# Patient Record
Sex: Female | Born: 1940
Health system: Southern US, Community
[De-identification: ages and names within clinical notes are randomized; demographics above are authoritative.]

## PROBLEM LIST (undated history)

## (undated) DIAGNOSIS — Z950 Presence of cardiac pacemaker: Secondary | ICD-10-CM

## (undated) DIAGNOSIS — Z87442 Personal history of urinary calculi: Secondary | ICD-10-CM

## (undated) DIAGNOSIS — Z8639 Personal history of other endocrine, nutritional and metabolic disease: Secondary | ICD-10-CM

## (undated) DIAGNOSIS — R351 Nocturia: Secondary | ICD-10-CM

## (undated) DIAGNOSIS — R112 Nausea with vomiting, unspecified: Secondary | ICD-10-CM

## (undated) DIAGNOSIS — I472 Ventricular tachycardia: Secondary | ICD-10-CM

## (undated) DIAGNOSIS — Z8679 Personal history of other diseases of the circulatory system: Secondary | ICD-10-CM

## (undated) DIAGNOSIS — K219 Gastro-esophageal reflux disease without esophagitis: Secondary | ICD-10-CM

## (undated) DIAGNOSIS — Z8719 Personal history of other diseases of the digestive system: Secondary | ICD-10-CM

## (undated) DIAGNOSIS — K449 Diaphragmatic hernia without obstruction or gangrene: Secondary | ICD-10-CM

## (undated) DIAGNOSIS — N1330 Unspecified hydronephrosis: Secondary | ICD-10-CM

## (undated) DIAGNOSIS — I442 Atrioventricular block, complete: Secondary | ICD-10-CM

## (undated) DIAGNOSIS — C50919 Malignant neoplasm of unspecified site of unspecified female breast: Secondary | ICD-10-CM

## (undated) DIAGNOSIS — F329 Major depressive disorder, single episode, unspecified: Secondary | ICD-10-CM

## (undated) DIAGNOSIS — I5189 Other ill-defined heart diseases: Secondary | ICD-10-CM

## (undated) DIAGNOSIS — I480 Paroxysmal atrial fibrillation: Secondary | ICD-10-CM

## (undated) DIAGNOSIS — I10 Essential (primary) hypertension: Secondary | ICD-10-CM

## (undated) DIAGNOSIS — I809 Phlebitis and thrombophlebitis of unspecified site: Secondary | ICD-10-CM

## (undated) DIAGNOSIS — I4729 Other ventricular tachycardia: Secondary | ICD-10-CM

## (undated) DIAGNOSIS — C642 Malignant neoplasm of left kidney, except renal pelvis: Secondary | ICD-10-CM

## (undated) DIAGNOSIS — T7840XA Allergy, unspecified, initial encounter: Secondary | ICD-10-CM

## (undated) DIAGNOSIS — R918 Other nonspecific abnormal finding of lung field: Secondary | ICD-10-CM

## (undated) DIAGNOSIS — R0602 Shortness of breath: Secondary | ICD-10-CM

## (undated) DIAGNOSIS — L259 Unspecified contact dermatitis, unspecified cause: Secondary | ICD-10-CM

## (undated) DIAGNOSIS — F32A Depression, unspecified: Secondary | ICD-10-CM

## (undated) DIAGNOSIS — K9049 Malabsorption due to intolerance, not elsewhere classified: Secondary | ICD-10-CM

## (undated) DIAGNOSIS — K909 Intestinal malabsorption, unspecified: Secondary | ICD-10-CM

## (undated) DIAGNOSIS — G4730 Sleep apnea, unspecified: Secondary | ICD-10-CM

## (undated) DIAGNOSIS — E039 Hypothyroidism, unspecified: Secondary | ICD-10-CM

## (undated) DIAGNOSIS — Z9889 Other specified postprocedural states: Secondary | ICD-10-CM

## (undated) DIAGNOSIS — Z87898 Personal history of other specified conditions: Secondary | ICD-10-CM

## (undated) HISTORY — DX: Major depressive disorder, single episode, unspecified: F32.9

## (undated) HISTORY — DX: Atrioventricular block, complete: I44.2

## (undated) HISTORY — DX: Depression, unspecified: F32.A

## (undated) HISTORY — DX: Hypothyroidism, unspecified: E03.9

## (undated) HISTORY — PX: COLON SURGERY: SHX602

## (undated) HISTORY — DX: Other ill-defined heart diseases: I51.89

## (undated) HISTORY — PX: CARDIOVASCULAR STRESS TEST: SHX262

## (undated) HISTORY — DX: Allergy, unspecified, initial encounter: T78.40XA

## (undated) HISTORY — DX: Gastro-esophageal reflux disease without esophagitis: K21.9

## (undated) HISTORY — DX: Malignant neoplasm of unspecified site of unspecified female breast: C50.919

## (undated) HISTORY — DX: Unspecified contact dermatitis, unspecified cause: L25.9

## (undated) HISTORY — DX: Essential (primary) hypertension: I10

## (undated) HISTORY — DX: Phlebitis and thrombophlebitis of unspecified site: I80.9

## (undated) HISTORY — PX: KYPHOPLASTY: SHX5884

---

## 2001-04-13 ENCOUNTER — Emergency Department (HOSPITAL_COMMUNITY): Admission: EM | Admit: 2001-04-13 | Discharge: 2001-04-13 | Payer: Self-pay

## 2001-04-13 ENCOUNTER — Encounter: Payer: Self-pay | Admitting: Emergency Medicine

## 2001-05-04 ENCOUNTER — Encounter: Payer: Self-pay | Admitting: Endocrinology

## 2001-05-04 ENCOUNTER — Ambulatory Visit (HOSPITAL_COMMUNITY): Admission: RE | Admit: 2001-05-04 | Discharge: 2001-05-04 | Payer: Self-pay | Admitting: Endocrinology

## 2001-05-26 ENCOUNTER — Ambulatory Visit (HOSPITAL_COMMUNITY): Admission: RE | Admit: 2001-05-26 | Discharge: 2001-05-26 | Payer: Self-pay | Admitting: Endocrinology

## 2001-05-26 ENCOUNTER — Encounter: Payer: Self-pay | Admitting: Endocrinology

## 2002-04-28 ENCOUNTER — Inpatient Hospital Stay (HOSPITAL_COMMUNITY): Admission: EM | Admit: 2002-04-28 | Discharge: 2002-05-06 | Payer: Self-pay | Admitting: Emergency Medicine

## 2002-04-28 ENCOUNTER — Encounter (INDEPENDENT_AMBULATORY_CARE_PROVIDER_SITE_OTHER): Payer: Self-pay | Admitting: Specialist

## 2002-04-28 HISTORY — PX: OTHER SURGICAL HISTORY: SHX169

## 2002-06-01 ENCOUNTER — Ambulatory Visit (HOSPITAL_COMMUNITY): Admission: RE | Admit: 2002-06-01 | Discharge: 2002-06-01 | Payer: Self-pay | Admitting: Surgery

## 2002-06-01 ENCOUNTER — Encounter: Payer: Self-pay | Admitting: Surgery

## 2002-07-13 ENCOUNTER — Ambulatory Visit (HOSPITAL_COMMUNITY): Admission: RE | Admit: 2002-07-13 | Discharge: 2002-07-13 | Payer: Self-pay | Admitting: Surgery

## 2002-07-13 ENCOUNTER — Encounter: Payer: Self-pay | Admitting: Surgery

## 2002-07-15 ENCOUNTER — Encounter: Payer: Self-pay | Admitting: Surgery

## 2002-07-22 ENCOUNTER — Inpatient Hospital Stay (HOSPITAL_COMMUNITY): Admission: RE | Admit: 2002-07-22 | Discharge: 2002-07-31 | Payer: Self-pay | Admitting: Surgery

## 2002-07-22 ENCOUNTER — Encounter (INDEPENDENT_AMBULATORY_CARE_PROVIDER_SITE_OTHER): Payer: Self-pay | Admitting: Specialist

## 2002-07-22 HISTORY — PX: COLOSTOMY TAKEDOWN: SHX5783

## 2002-08-13 ENCOUNTER — Ambulatory Visit (HOSPITAL_COMMUNITY): Admission: RE | Admit: 2002-08-13 | Discharge: 2002-08-13 | Payer: Self-pay | Admitting: Surgery

## 2002-08-13 ENCOUNTER — Encounter: Payer: Self-pay | Admitting: Surgery

## 2002-10-18 ENCOUNTER — Ambulatory Visit (HOSPITAL_COMMUNITY): Admission: RE | Admit: 2002-10-18 | Discharge: 2002-10-18 | Payer: Self-pay | Admitting: *Deleted

## 2002-10-18 ENCOUNTER — Encounter: Payer: Self-pay | Admitting: *Deleted

## 2002-12-15 ENCOUNTER — Encounter (INDEPENDENT_AMBULATORY_CARE_PROVIDER_SITE_OTHER): Payer: Self-pay | Admitting: Specialist

## 2002-12-15 ENCOUNTER — Ambulatory Visit (HOSPITAL_COMMUNITY): Admission: RE | Admit: 2002-12-15 | Discharge: 2002-12-15 | Payer: Self-pay | Admitting: Gastroenterology

## 2003-01-25 ENCOUNTER — Encounter: Payer: Self-pay | Admitting: Emergency Medicine

## 2003-01-25 ENCOUNTER — Emergency Department (HOSPITAL_COMMUNITY): Admission: EM | Admit: 2003-01-25 | Discharge: 2003-01-26 | Payer: Self-pay | Admitting: Emergency Medicine

## 2003-02-02 ENCOUNTER — Encounter: Payer: Self-pay | Admitting: Internal Medicine

## 2003-02-02 ENCOUNTER — Ambulatory Visit (HOSPITAL_COMMUNITY): Admission: RE | Admit: 2003-02-02 | Discharge: 2003-02-02 | Payer: Self-pay | Admitting: Internal Medicine

## 2003-02-09 ENCOUNTER — Other Ambulatory Visit: Admission: RE | Admit: 2003-02-09 | Discharge: 2003-02-09 | Payer: Self-pay | Admitting: Obstetrics and Gynecology

## 2003-02-14 ENCOUNTER — Ambulatory Visit (HOSPITAL_COMMUNITY): Admission: RE | Admit: 2003-02-14 | Discharge: 2003-02-14 | Payer: Self-pay | Admitting: Internal Medicine

## 2003-02-14 ENCOUNTER — Encounter: Payer: Self-pay | Admitting: Internal Medicine

## 2003-03-10 ENCOUNTER — Ambulatory Visit (HOSPITAL_COMMUNITY): Admission: RE | Admit: 2003-03-10 | Discharge: 2003-03-10 | Payer: Self-pay | Admitting: Surgery

## 2003-05-10 ENCOUNTER — Inpatient Hospital Stay (HOSPITAL_COMMUNITY): Admission: EM | Admit: 2003-05-10 | Discharge: 2003-05-13 | Payer: Self-pay | Admitting: Emergency Medicine

## 2003-05-11 ENCOUNTER — Encounter: Payer: Self-pay | Admitting: Cardiology

## 2003-05-11 HISTORY — PX: PACEMAKER PLACEMENT: SHX43

## 2003-05-12 ENCOUNTER — Encounter: Payer: Self-pay | Admitting: Cardiology

## 2003-05-16 ENCOUNTER — Inpatient Hospital Stay (HOSPITAL_COMMUNITY): Admission: EM | Admit: 2003-05-16 | Discharge: 2003-05-18 | Payer: Self-pay | Admitting: Emergency Medicine

## 2003-05-17 ENCOUNTER — Encounter: Payer: Self-pay | Admitting: Cardiology

## 2003-05-20 ENCOUNTER — Ambulatory Visit (HOSPITAL_COMMUNITY): Admission: RE | Admit: 2003-05-20 | Discharge: 2003-05-20 | Payer: Self-pay | Admitting: Cardiology

## 2003-08-18 ENCOUNTER — Ambulatory Visit (HOSPITAL_COMMUNITY): Admission: RE | Admit: 2003-08-18 | Discharge: 2003-08-18 | Payer: Self-pay | Admitting: Internal Medicine

## 2003-09-08 ENCOUNTER — Ambulatory Visit (HOSPITAL_COMMUNITY): Admission: RE | Admit: 2003-09-08 | Discharge: 2003-09-08 | Payer: Self-pay | Admitting: Cardiology

## 2004-01-10 ENCOUNTER — Encounter: Admission: RE | Admit: 2004-01-10 | Discharge: 2004-01-10 | Payer: Self-pay | Admitting: Surgery

## 2004-04-16 ENCOUNTER — Other Ambulatory Visit: Admission: RE | Admit: 2004-04-16 | Discharge: 2004-04-16 | Payer: Self-pay | Admitting: Obstetrics and Gynecology

## 2004-04-26 ENCOUNTER — Ambulatory Visit (HOSPITAL_COMMUNITY): Admission: RE | Admit: 2004-04-26 | Discharge: 2004-04-26 | Payer: Self-pay | Admitting: Obstetrics and Gynecology

## 2004-05-04 ENCOUNTER — Inpatient Hospital Stay (HOSPITAL_COMMUNITY): Admission: RE | Admit: 2004-05-04 | Discharge: 2004-05-09 | Payer: Self-pay | Admitting: Surgery

## 2004-05-04 HISTORY — PX: OTHER SURGICAL HISTORY: SHX169

## 2004-06-05 ENCOUNTER — Ambulatory Visit: Payer: Self-pay | Admitting: Internal Medicine

## 2004-06-21 ENCOUNTER — Ambulatory Visit (HOSPITAL_COMMUNITY): Admission: RE | Admit: 2004-06-21 | Discharge: 2004-06-21 | Payer: Self-pay | Admitting: Pediatrics

## 2004-08-16 ENCOUNTER — Ambulatory Visit: Payer: Self-pay | Admitting: Internal Medicine

## 2004-08-25 ENCOUNTER — Ambulatory Visit: Payer: Self-pay | Admitting: Cardiovascular Disease

## 2004-08-27 ENCOUNTER — Inpatient Hospital Stay (HOSPITAL_COMMUNITY): Admission: EM | Admit: 2004-08-27 | Discharge: 2004-08-29 | Payer: Self-pay | Admitting: Emergency Medicine

## 2004-08-27 ENCOUNTER — Encounter: Payer: Self-pay | Admitting: Internal Medicine

## 2004-09-07 ENCOUNTER — Ambulatory Visit: Payer: Self-pay | Admitting: Internal Medicine

## 2004-09-17 ENCOUNTER — Ambulatory Visit (HOSPITAL_COMMUNITY): Admission: RE | Admit: 2004-09-17 | Discharge: 2004-09-17 | Payer: Self-pay | Admitting: Internal Medicine

## 2004-11-14 ENCOUNTER — Ambulatory Visit: Payer: Self-pay | Admitting: Internal Medicine

## 2004-11-14 ENCOUNTER — Ambulatory Visit (HOSPITAL_COMMUNITY): Admission: RE | Admit: 2004-11-14 | Discharge: 2004-11-14 | Payer: Self-pay | Admitting: Internal Medicine

## 2004-11-23 ENCOUNTER — Ambulatory Visit: Payer: Self-pay | Admitting: Internal Medicine

## 2005-01-25 ENCOUNTER — Ambulatory Visit: Payer: Self-pay | Admitting: Cardiology

## 2005-04-15 ENCOUNTER — Ambulatory Visit: Payer: Self-pay | Admitting: Internal Medicine

## 2005-06-26 ENCOUNTER — Ambulatory Visit: Payer: Self-pay | Admitting: Internal Medicine

## 2005-09-12 ENCOUNTER — Ambulatory Visit: Payer: Self-pay | Admitting: Cardiology

## 2005-10-23 ENCOUNTER — Ambulatory Visit: Payer: Self-pay | Admitting: Internal Medicine

## 2006-04-02 ENCOUNTER — Ambulatory Visit: Payer: Self-pay | Admitting: Internal Medicine

## 2006-04-18 ENCOUNTER — Ambulatory Visit: Payer: Self-pay | Admitting: Internal Medicine

## 2006-05-20 ENCOUNTER — Ambulatory Visit: Payer: Self-pay | Admitting: Internal Medicine

## 2006-06-18 ENCOUNTER — Ambulatory Visit: Payer: Self-pay | Admitting: Internal Medicine

## 2006-07-16 ENCOUNTER — Ambulatory Visit: Payer: Self-pay | Admitting: Internal Medicine

## 2006-07-22 ENCOUNTER — Ambulatory Visit: Payer: Self-pay | Admitting: Family Medicine

## 2006-08-28 ENCOUNTER — Ambulatory Visit (HOSPITAL_COMMUNITY): Admission: RE | Admit: 2006-08-28 | Discharge: 2006-08-28 | Payer: Self-pay | Admitting: Obstetrics and Gynecology

## 2006-08-31 ENCOUNTER — Emergency Department (HOSPITAL_COMMUNITY): Admission: EM | Admit: 2006-08-31 | Discharge: 2006-08-31 | Payer: Self-pay | Admitting: Emergency Medicine

## 2006-10-03 ENCOUNTER — Ambulatory Visit: Payer: Self-pay | Admitting: Internal Medicine

## 2006-10-03 ENCOUNTER — Other Ambulatory Visit: Admission: RE | Admit: 2006-10-03 | Discharge: 2006-10-03 | Payer: Self-pay | Admitting: Obstetrics and Gynecology

## 2006-10-03 LAB — CONVERTED CEMR LAB
AST: 33 units/L (ref 0–37)
Albumin: 4.6 g/dL (ref 3.5–5.2)
Basophils Absolute: 0 10*3/uL (ref 0.0–0.1)
Basophils Relative: 0.6 % (ref 0.0–1.0)
Bilirubin Urine: NEGATIVE
Bilirubin, Direct: 0.1 mg/dL (ref 0.0–0.3)
CO2: 32 meq/L (ref 19–32)
Eosinophils Absolute: 0.1 10*3/uL (ref 0.0–0.6)
GFR calc non Af Amer: 76 mL/min
Glucose, Bld: 103 mg/dL — ABNORMAL HIGH (ref 70–99)
HDL: 48.9 mg/dL (ref >39.0)
Hemoglobin: 13.2 g/dL (ref 12.0–15.0)
Ketones, ur: NEGATIVE mg/dL
Lymphocytes Relative: 26.9 % (ref 12.0–46.0)
Monocytes Relative: 9.8 % (ref 3.0–11.0)
Neutro Abs: 3 10*3/uL (ref 1.4–7.7)
Neutrophils Relative %: 61 % (ref 43.0–77.0)
Platelets: 148 10*3/uL — ABNORMAL LOW (ref 150–400)
Potassium: 4.3 meq/L (ref 3.5–5.1)
RBC: 4.88 M/uL (ref 3.87–5.11)
Sodium: 140 meq/L (ref 135–145)
Specific Gravity, Urine: <=1.005 (ref 1.000–1.03)
Total Protein, Urine: NEGATIVE mg/dL
Total Protein: 7.1 g/dL (ref 6.0–8.3)
Triglycerides: 78 mg/dL (ref 0–149)
Urine Glucose: NEGATIVE mg/dL
VLDL: 16 mg/dL (ref 0–40)
WBC: 4.9 10*3/uL (ref 4.5–10.5)
pH: 6.5 (ref 5.0–8.0)

## 2006-10-04 ENCOUNTER — Ambulatory Visit: Payer: Self-pay | Admitting: Family Medicine

## 2006-10-04 ENCOUNTER — Ambulatory Visit (HOSPITAL_COMMUNITY): Admission: RE | Admit: 2006-10-04 | Discharge: 2006-10-04 | Payer: Self-pay | Admitting: Internal Medicine

## 2006-11-04 ENCOUNTER — Ambulatory Visit: Payer: Self-pay | Admitting: Cardiology

## 2006-12-19 ENCOUNTER — Ambulatory Visit: Payer: Self-pay | Admitting: Internal Medicine

## 2007-01-23 DIAGNOSIS — K219 Gastro-esophageal reflux disease without esophagitis: Secondary | ICD-10-CM | POA: Insufficient documentation

## 2007-01-23 DIAGNOSIS — I10 Essential (primary) hypertension: Secondary | ICD-10-CM

## 2007-01-23 DIAGNOSIS — Z8639 Personal history of other endocrine, nutritional and metabolic disease: Secondary | ICD-10-CM

## 2007-01-23 DIAGNOSIS — Z862 Personal history of diseases of the blood and blood-forming organs and certain disorders involving the immune mechanism: Secondary | ICD-10-CM

## 2007-01-23 DIAGNOSIS — F329 Major depressive disorder, single episode, unspecified: Secondary | ICD-10-CM

## 2007-01-23 DIAGNOSIS — M810 Age-related osteoporosis without current pathological fracture: Secondary | ICD-10-CM | POA: Insufficient documentation

## 2007-02-16 ENCOUNTER — Ambulatory Visit: Payer: Self-pay | Admitting: Internal Medicine

## 2007-04-02 ENCOUNTER — Ambulatory Visit: Payer: Self-pay

## 2007-04-09 ENCOUNTER — Ambulatory Visit: Payer: Self-pay | Admitting: Internal Medicine

## 2007-05-22 ENCOUNTER — Telehealth: Payer: Self-pay | Admitting: Internal Medicine

## 2007-05-22 ENCOUNTER — Ambulatory Visit: Payer: Self-pay | Admitting: Internal Medicine

## 2007-07-22 ENCOUNTER — Ambulatory Visit: Payer: Self-pay | Admitting: Internal Medicine

## 2007-07-28 ENCOUNTER — Ambulatory Visit: Payer: Self-pay | Admitting: Internal Medicine

## 2007-07-28 LAB — CONVERTED CEMR LAB
AST: 22 units/L (ref 0–37)
Albumin: 4.2 g/dL (ref 3.5–5.2)
Bilirubin, Direct: 0.1 mg/dL (ref 0.0–0.3)
CO2: 35 meq/L — ABNORMAL HIGH (ref 19–32)
Calcium: 9.6 mg/dL (ref 8.4–10.5)
Cholesterol: 163 mg/dL (ref 0–200)
Creatinine, Ser: 0.8 mg/dL (ref 0.4–1.2)
GFR calc non Af Amer: 76 mL/min
LDL Cholesterol: 94 mg/dL (ref 0–99)
VLDL: 21 mg/dL (ref 0–40)
Vit D, 1,25-Dihydroxy: 28 — ABNORMAL LOW (ref 30–89)

## 2007-07-30 ENCOUNTER — Encounter: Payer: Self-pay | Admitting: Internal Medicine

## 2007-10-18 ENCOUNTER — Ambulatory Visit: Payer: Self-pay | Admitting: *Deleted

## 2007-10-18 ENCOUNTER — Inpatient Hospital Stay (HOSPITAL_COMMUNITY): Admission: EM | Admit: 2007-10-18 | Discharge: 2007-10-20 | Payer: Self-pay | Admitting: Emergency Medicine

## 2007-10-19 ENCOUNTER — Encounter: Payer: Self-pay | Admitting: Internal Medicine

## 2007-10-26 ENCOUNTER — Ambulatory Visit: Payer: Self-pay | Admitting: Internal Medicine

## 2007-10-26 DIAGNOSIS — I519 Heart disease, unspecified: Secondary | ICD-10-CM

## 2007-10-27 ENCOUNTER — Encounter (INDEPENDENT_AMBULATORY_CARE_PROVIDER_SITE_OTHER): Payer: Self-pay | Admitting: *Deleted

## 2007-11-03 ENCOUNTER — Ambulatory Visit: Payer: Self-pay

## 2007-11-03 ENCOUNTER — Encounter: Payer: Self-pay | Admitting: Internal Medicine

## 2007-11-03 ENCOUNTER — Ambulatory Visit: Payer: Self-pay | Admitting: Internal Medicine

## 2007-12-24 ENCOUNTER — Encounter: Payer: Self-pay | Admitting: Internal Medicine

## 2008-01-19 ENCOUNTER — Ambulatory Visit: Payer: Self-pay | Admitting: Internal Medicine

## 2008-01-19 DIAGNOSIS — E042 Nontoxic multinodular goiter: Secondary | ICD-10-CM

## 2008-01-21 ENCOUNTER — Telehealth: Payer: Self-pay | Admitting: Internal Medicine

## 2008-01-26 ENCOUNTER — Encounter: Admission: RE | Admit: 2008-01-26 | Discharge: 2008-01-26 | Payer: Self-pay | Admitting: Internal Medicine

## 2008-01-29 ENCOUNTER — Encounter: Payer: Self-pay | Admitting: Internal Medicine

## 2008-02-15 ENCOUNTER — Ambulatory Visit: Payer: Self-pay | Admitting: Internal Medicine

## 2008-03-24 ENCOUNTER — Ambulatory Visit: Payer: Self-pay | Admitting: Internal Medicine

## 2008-05-02 ENCOUNTER — Ambulatory Visit: Payer: Self-pay | Admitting: Internal Medicine

## 2008-05-02 ENCOUNTER — Telehealth: Payer: Self-pay | Admitting: Internal Medicine

## 2008-05-02 DIAGNOSIS — G44209 Tension-type headache, unspecified, not intractable: Secondary | ICD-10-CM

## 2008-05-27 ENCOUNTER — Ambulatory Visit: Payer: Self-pay | Admitting: Internal Medicine

## 2008-05-30 ENCOUNTER — Telehealth: Payer: Self-pay | Admitting: Internal Medicine

## 2008-06-01 ENCOUNTER — Ambulatory Visit: Payer: Self-pay | Admitting: Cardiology

## 2008-06-01 LAB — CONVERTED CEMR LAB
Free T4: 1.4 ng/dL (ref 0.6–1.6)
TSH: 0.05 microintl units/mL — ABNORMAL LOW (ref 0.35–5.50)

## 2008-06-13 ENCOUNTER — Encounter: Payer: Self-pay | Admitting: Internal Medicine

## 2008-06-15 ENCOUNTER — Ambulatory Visit: Payer: Self-pay | Admitting: Internal Medicine

## 2008-06-15 ENCOUNTER — Encounter (INDEPENDENT_AMBULATORY_CARE_PROVIDER_SITE_OTHER): Payer: Self-pay | Admitting: *Deleted

## 2008-07-26 ENCOUNTER — Ambulatory Visit: Payer: Self-pay | Admitting: Cardiology

## 2008-07-26 LAB — CONVERTED CEMR LAB: Free T4: 1.3 ng/dL (ref 0.6–1.6)

## 2008-08-25 ENCOUNTER — Ambulatory Visit: Payer: Self-pay | Admitting: Internal Medicine

## 2008-08-25 LAB — CONVERTED CEMR LAB: T4, Total: 10.3 ug/dL (ref 5.0–12.5)

## 2008-08-28 ENCOUNTER — Telehealth: Payer: Self-pay | Admitting: Internal Medicine

## 2008-09-05 ENCOUNTER — Ambulatory Visit: Payer: Self-pay | Admitting: Internal Medicine

## 2008-10-04 ENCOUNTER — Encounter (HOSPITAL_COMMUNITY): Admission: RE | Admit: 2008-10-04 | Discharge: 2008-11-04 | Payer: Self-pay | Admitting: Internal Medicine

## 2008-10-06 ENCOUNTER — Encounter: Payer: Self-pay | Admitting: Internal Medicine

## 2008-10-10 ENCOUNTER — Encounter: Payer: Self-pay | Admitting: Internal Medicine

## 2008-10-10 DIAGNOSIS — I8000 Phlebitis and thrombophlebitis of superficial vessels of unspecified lower extremity: Secondary | ICD-10-CM | POA: Insufficient documentation

## 2008-10-10 DIAGNOSIS — J984 Other disorders of lung: Secondary | ICD-10-CM | POA: Insufficient documentation

## 2008-10-11 ENCOUNTER — Encounter: Payer: Self-pay | Admitting: Cardiology

## 2008-10-11 ENCOUNTER — Ambulatory Visit: Payer: Self-pay | Admitting: Cardiology

## 2008-11-11 ENCOUNTER — Ambulatory Visit: Payer: Self-pay | Admitting: Cardiology

## 2008-11-11 ENCOUNTER — Telehealth: Payer: Self-pay | Admitting: Cardiology

## 2008-11-11 ENCOUNTER — Inpatient Hospital Stay (HOSPITAL_COMMUNITY): Admission: EM | Admit: 2008-11-11 | Discharge: 2008-11-12 | Payer: Self-pay | Admitting: Emergency Medicine

## 2008-11-24 ENCOUNTER — Telehealth: Payer: Self-pay | Admitting: Internal Medicine

## 2008-11-29 ENCOUNTER — Telehealth (INDEPENDENT_AMBULATORY_CARE_PROVIDER_SITE_OTHER): Payer: Self-pay | Admitting: *Deleted

## 2008-11-30 ENCOUNTER — Ambulatory Visit: Payer: Self-pay | Admitting: Internal Medicine

## 2008-11-30 DIAGNOSIS — M6740 Ganglion, unspecified site: Secondary | ICD-10-CM | POA: Insufficient documentation

## 2008-11-30 DIAGNOSIS — D179 Benign lipomatous neoplasm, unspecified: Secondary | ICD-10-CM | POA: Insufficient documentation

## 2008-12-13 ENCOUNTER — Ambulatory Visit: Payer: Self-pay | Admitting: Internal Medicine

## 2008-12-13 ENCOUNTER — Encounter: Payer: Self-pay | Admitting: Internal Medicine

## 2009-01-10 ENCOUNTER — Telehealth: Payer: Self-pay | Admitting: Internal Medicine

## 2009-01-18 ENCOUNTER — Telehealth: Payer: Self-pay | Admitting: Internal Medicine

## 2009-02-11 ENCOUNTER — Telehealth: Payer: Self-pay | Admitting: Internal Medicine

## 2009-02-14 ENCOUNTER — Ambulatory Visit: Payer: Self-pay | Admitting: Internal Medicine

## 2009-02-16 ENCOUNTER — Telehealth: Payer: Self-pay | Admitting: Internal Medicine

## 2009-02-24 LAB — CONVERTED CEMR LAB
TSH: 0.03 microintl units/mL — ABNORMAL LOW (ref 0.35–5.50)
Vit D, 25-Hydroxy: 24 ng/mL — ABNORMAL LOW (ref 30–89)

## 2009-03-20 ENCOUNTER — Encounter: Payer: Self-pay | Admitting: Internal Medicine

## 2009-04-11 ENCOUNTER — Ambulatory Visit: Payer: Self-pay | Admitting: Internal Medicine

## 2009-04-24 ENCOUNTER — Telehealth (INDEPENDENT_AMBULATORY_CARE_PROVIDER_SITE_OTHER): Payer: Self-pay | Admitting: *Deleted

## 2009-05-10 ENCOUNTER — Encounter (INDEPENDENT_AMBULATORY_CARE_PROVIDER_SITE_OTHER): Payer: Self-pay | Admitting: *Deleted

## 2009-05-16 ENCOUNTER — Encounter (INDEPENDENT_AMBULATORY_CARE_PROVIDER_SITE_OTHER): Payer: Self-pay | Admitting: *Deleted

## 2009-05-16 ENCOUNTER — Ambulatory Visit: Payer: Self-pay | Admitting: Cardiology

## 2009-05-17 ENCOUNTER — Ambulatory Visit: Payer: Self-pay | Admitting: Cardiology

## 2009-05-21 LAB — CONVERTED CEMR LAB
BUN: 13 mg/dL (ref 6–23)
CO2: 30 meq/L (ref 19–32)
Creatinine, Ser: 0.8 mg/dL (ref 0.4–1.2)
GFR calc non Af Amer: 90.99 mL/min (ref >60)
Glucose, Bld: 93 mg/dL (ref 70–99)

## 2009-06-08 ENCOUNTER — Ambulatory Visit: Payer: Self-pay | Admitting: Internal Medicine

## 2009-06-09 ENCOUNTER — Telehealth: Payer: Self-pay | Admitting: Internal Medicine

## 2009-07-03 ENCOUNTER — Telehealth: Payer: Self-pay | Admitting: Internal Medicine

## 2009-07-04 ENCOUNTER — Ambulatory Visit: Payer: Self-pay | Admitting: Internal Medicine

## 2009-07-04 LAB — CONVERTED CEMR LAB
TSH: 0.09 microintl units/mL — ABNORMAL LOW (ref 0.35–5.50)
Vit D, 25-Hydroxy: 32 ng/mL (ref 30–89)

## 2009-07-09 ENCOUNTER — Telehealth: Payer: Self-pay | Admitting: Internal Medicine

## 2009-09-18 ENCOUNTER — Encounter: Payer: Self-pay | Admitting: Internal Medicine

## 2009-09-19 ENCOUNTER — Encounter: Payer: Self-pay | Admitting: Internal Medicine

## 2009-09-19 ENCOUNTER — Telehealth: Payer: Self-pay | Admitting: Internal Medicine

## 2009-09-26 ENCOUNTER — Encounter: Payer: Self-pay | Admitting: Internal Medicine

## 2009-09-28 ENCOUNTER — Telehealth: Payer: Self-pay | Admitting: Internal Medicine

## 2009-10-12 ENCOUNTER — Ambulatory Visit: Payer: Self-pay | Admitting: Internal Medicine

## 2009-10-13 ENCOUNTER — Ambulatory Visit: Payer: Self-pay | Admitting: Internal Medicine

## 2009-11-23 ENCOUNTER — Ambulatory Visit: Payer: Self-pay | Admitting: Internal Medicine

## 2009-11-23 DIAGNOSIS — I442 Atrioventricular block, complete: Secondary | ICD-10-CM | POA: Insufficient documentation

## 2010-04-04 ENCOUNTER — Telehealth: Payer: Self-pay | Admitting: Internal Medicine

## 2010-04-13 ENCOUNTER — Ambulatory Visit: Payer: Self-pay | Admitting: Internal Medicine

## 2010-05-17 ENCOUNTER — Ambulatory Visit: Payer: Self-pay

## 2010-06-09 ENCOUNTER — Emergency Department (HOSPITAL_BASED_OUTPATIENT_CLINIC_OR_DEPARTMENT_OTHER)
Admission: EM | Admit: 2010-06-09 | Discharge: 2010-06-09 | Payer: Self-pay | Source: Home / Self Care | Admitting: Emergency Medicine

## 2010-06-11 ENCOUNTER — Emergency Department (HOSPITAL_BASED_OUTPATIENT_CLINIC_OR_DEPARTMENT_OTHER)
Admission: EM | Admit: 2010-06-11 | Discharge: 2010-06-11 | Payer: Self-pay | Source: Home / Self Care | Admitting: Emergency Medicine

## 2010-06-12 ENCOUNTER — Ambulatory Visit: Payer: Self-pay | Admitting: Cardiology

## 2010-06-22 ENCOUNTER — Encounter: Payer: Self-pay | Admitting: Cardiology

## 2010-06-22 ENCOUNTER — Ambulatory Visit: Payer: Self-pay | Admitting: Cardiology

## 2010-06-22 LAB — CONVERTED CEMR LAB
CO2: 27 meq/L (ref 19–32)
Chloride: 101 meq/L (ref 96–112)
Creatinine, Ser: 0.7 mg/dL (ref 0.4–1.2)
Potassium: 3.9 meq/L (ref 3.5–5.1)
Sodium: 135 meq/L (ref 135–145)

## 2010-07-11 ENCOUNTER — Telehealth: Payer: Self-pay | Admitting: Cardiology

## 2010-07-22 ENCOUNTER — Encounter: Payer: Self-pay | Admitting: Obstetrics and Gynecology

## 2010-07-29 LAB — CONVERTED CEMR LAB
GFR calc non Af Amer: 106.15 mL/min (ref >60)
Potassium: 5.5 meq/L — ABNORMAL HIGH (ref 3.5–5.1)

## 2010-07-31 NOTE — Assessment & Plan Note (Signed)
Summary: DEVICE FOLLOW-UP/LWB   Visit Type:  Follow-up Primary Provider:  Norins  CC:  no complaints.  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  she's had no symptoms of chest pain shortness of breath or peripheral edema.  Her blood pressure is noted to be elevated today.  This has been true in the past as well. She has had modest concern about this. She takes ibuprofen only rarely and is quite judicious about her salt intake.  Review of previous data demonstrates an echo in 2009 at which time she had normal left ventricular function.  chest x-rays were reviewed back to 2006 all of which showed stable (potentially dislodged) position of the atrial lead  Current Medications (verified): 1)  Synthroid 100 Mcg Tabs (Levothyroxine Sodium) .[name].. 1 By Mouth Once Daily 2)  Multivitamins   Caps (Multiple Vitamin) .... Once Daily 3)  Calcium Carbonate-Vitamin D 600-400 Mg-Unit  Tabs (Calcium Carbonate-Vitamin D) .... Take 2 Two Times A Day 4)  Furosemide 40 Mg  Tabs (Furosemide) .[name].. 1 By Mouth Once Daily 5)  Lisinopril 20 Mg  Tabs (Lisinopril) .... 2 Tabs Once Daily 6)  Nexium 40 Mg  Cpdr (Esomeprazole Magnesium) .... Q Am As Needed 7)  Prolia 60 Mg/ml Soln (Denosumab) .... One Inject. Every 6 Months 8)  Ibuprofen 200 Mg Tabs (Ibuprofen) .... 3 Tabs As Needed  Allergies (verified): 1)  ! Codeine  Past History:  Past Medical History: Last updated: 05/16/2009 HEART BLOCK (ICD-426.9) DIASTOLIC DYSFUNCTION (ICD-429.9) HYPERTENSION (ICD-401.9) NECK PAIN, ACUTE (ICD-723.1) HEADACHE, TENSION (ICD-307.81) GOITER, MULTINODULAR (ICD-241.1) SHORTNESS OF BREATH (ICD-786.05) CONTACT DERMATITIS&OTHER ECZEMA DUE UNSPEC CAUSE (ICD-692.9) ABSCESS, TOOTH (ICD-522.5) THYROID NODULE, HX OF (ICD-V12.2) * UCD OSTEOPOROSIS (ICD-733.00) GERD  (ICD-530.81) DEPRESSION (ICD-311) LUNG NODULE (ICD-518.89) SUPERFICIAL THROMBOPHLEBITIS (ICD-451.0) HYPOTHYROIDISM, HX OF (ICD-V12.2) OTHER ACUTE SINUSITIS (ICD-461.8) 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   hernia repair.  Family History: Last updated: Oct 27, 2008  Mother died at the age of 46 of unclear causes.  It was   thought that she had some heart disease at the time.  Her father died at   the age of 58 of congestive heart failure.  She has a sister who has   diverticulosis and two brothers with hypertension.  Social History: Last updated: 10/27/2008  She lives in [place] with her daughter.  She has   remote tobacco use, quitting 30 years ago.  No alcohol or drugs.  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   Vital Signs:  Patient profile:   70 year old female Height:      65 inches Weight:      133 pounds BMI:     22.21 Pulse rate:   82 / minute Resp:     16 per minute BP sitting:   152 / 98  (right arm)  Vitals Entered By: Marrion Coy, CNA (Nov 23, 2009 2:12 PM)  Physical Exam  General:  The patient was alert and oriented in no acute distress. HEENT Normal.  Neck veins were flat, carotids were brisk.  Lungs were clear.  Heart sounds were regular without murmurs or gallops.  Abdomen was soft with active bowel sounds. There is  no clubbing cyanosis or edema. Skin Warm and dry affect is engaging. Neurological exam grossly normal   PPM Specifications Following MD:  Sherryl Manges, MD     PPM Vendor:  Medtronic     PPM Model Number:  [id number]     PPM Serial Number:  [id number] PPM DOI:  05/11/2003      Lead 1    Location: atrial     DOI: 05/11/2003     Model #: 1642     Serial #: [id number]     Status: active Lead 2    Location: RV     DOI: 05/11/2003      Model #: 1646     Serial #: [id number]     Status: active  Magnet Response Rate:  BOL 85 ERI 65  Indications:  complete heart block   PPM Follow Up Battery Voltage:  2.71 V     Battery Est. Longevity:  22 mths     Pacer Dependent:  Yes       PPM Device Measurements Atrium  Amplitude: 2.80 mV, Impedance: 619 ohms, Threshold: 0.50 V at 0.40 msec Right Ventricle  Amplitude: PACED mV, Impedance: 659 ohms, Threshold: 0.750 V at 0.40 msec  Episodes MS Episodes:  18     Percent Mode Switch:  <0.1%     Coumadin:  No Ventricular High Rate:  1     Atrial Pacing:  0.2%     Ventricular Pacing:  99.9%  Parameters Mode:  DDD     Lower Rate Limit:  60     Upper Rate Limit:  130 Paced AV Delay:  150     Sensed AV Delay:  120 Next Cardiology Appt Due:  05/01/2010 Tech Comments:  1 EPISODE OF VT LASTING 3 SECONDS.  18 AHR EPISODES--LONGEST WAS 33 SECONDS --AFLUTTER.  NO RWAVES AT VVI 40.  NORMAL THRESHOLD TESTING.  BATTERY LONGEVITY 22 MTHS.  ROV IN 6 MTHS.  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 Dating back to 2005 disposition is stable and function is adequate. No intervention is planned  Problem # 3:  HYPERTENSION (ICD-401.9) Her hypertension is poorly controlled. Will plan to revise her medications as follows #1 discontinue Lasix #2 change lisinopril to lisinopril/HCT #3 begin amlodipine 2.5 mg. The following medications were removed from the medication list:    Furosemide 40 Mg Tabs (Furosemide) .[name]... 1 by mouth once daily Her updated medication list for this problem includes:    Lisinopril-hydrochlorothiazide 20-12.5 Mg Tabs (Lisinopril-hydrochlorothiazide) .[name]...  Take 1 tablet by mouth twice daily    Amlodipine Besylate 2.5 Mg Tabs (Amlodipine besylate) .[name]... Take one tablet by mouth daily  Problem # 4:  VENTRICULAR TACHYCARDIA- NONSUSTAINED (ICD-427.1) Nonsustained ventricular tachycardia was identified on her monitor. We'll repeat her cardiac ultrasound. Her functional status remains very good and I suspect her LV function will be the same Her updated medication list for this problem includes:    Lisinopril-hydrochlorothiazide 20-12.5 Mg Tabs (Lisinopril-hydrochlorothiazide) .[name]... Take 1 tablet by mouth twice daily    Amlodipine Besylate 2.5 Mg Tabs (Amlodipine besylate) .[name]... Take one tablet by mouth daily  Problem # 5:  ATRIAL TACHYCARDIA-NONSUSTAINED (ICD-427.0) Nonsustained atrial tachycardia was identified. we'll  continue to track atrial arrhythmias for the possible development of atrial fibrillation  Problem # 6:  AV BLOCK, COMPLETE (ICD-426.0) Stable post pacemaker implantation; she is device dependent  Patient Instructions: 1)  Your physician has recommended you make the following change in your medication: Stop Furosemide.  Stop Lisinopril.  Start Lisinopril/HCT 20/12.5 1 tablet twice daily.  Start Amlodipine 2.5mg  1 tablet daily.  2)  Your physician wants you to follow-up in:  6 months with device clinic.  You will receive a reminder letter in the mail two months in advance. If you don't receive a letter, please call our office to schedule the follow-up appointment. Prescriptions: AMLODIPINE BESYLATE 2.5 MG TABS (AMLODIPINE BESYLATE) Take one tablet by mouth daily  #30 x 11   Entered by:   Optometrist BSN   Authorized by:   Nathen May, MD, [facility]   Signed by:   Gypsy Balsam RN BSN on 11/23/2009   Method used:   Electronically to        [facility] # [phone]* (retail)       [address]       [place], [place]  [address]       Ph: [phone]       Fax: [phone]   RxID:    [id number] LISINOPRIL-HYDROCHLOROTHIAZIDE 20-12.5 MG TABS (LISINOPRIL-HYDROCHLOROTHIAZIDE) Take 1 tablet by mouth twice daily  #180 x 3   Entered by:   Optometrist BSN   Authorized by:   Nathen May, MD, [facility]   Signed by:   Gypsy Balsam RN BSN on 11/23/2009   Method used:   Electronically to        [facility] # [phone]* (retail)       [address]       [place], [place]  [address]       Ph: [phone]       Fax: [phone]   RxID:   [id number]

## 2010-07-31 NOTE — Progress Notes (Signed)
Summary: flu shot    Immunization History:  Influenza Immunization History:    Influenza:  0.72ml left deltoid im  (03/30/2010)

## 2010-07-31 NOTE — Medication Information (Signed)
Summary: ProliaPlus  ProliaPlus   Imported By: Lester [place] 10/03/2009 07:18:27  _____________________________________________________________________  External Attachment:    Type:   Image     Comment:   External Document

## 2010-07-31 NOTE — Assessment & Plan Note (Signed)
Summary: prolia injection-lb  Nurse Visit   Allergies: 1)  ! Codeine  Medication Administration  Injection # 1:    Medication: Prolia 60mg     Diagnosis: OSTEOPOROSIS (ICD-733.00)    Route: SQ    Site: R deltoid    Exp Date: 06/01/2011    Lot #: [id number]    Mfr: Amgen    Patient tolerated injection without complications    Given by: Lanier Prude, [facility]) (April 13, 2010 9:57 AM)  Orders Added: 1)  Prolia 60mg  [J3590] 2)  Admin of Therapeutic Inj  intramuscular or subcutaneous [45409]

## 2010-07-31 NOTE — Assessment & Plan Note (Signed)
Summary: PROLIA INJ PER LOU/MEN/CD  Nurse Visit  CC: after verifing pt's DOB, injection was administered in her right arm SQ , pt tolerated well/ ab   Allergies: 1)  ! Codeine  Medication Administration  Injection # 1:    Medication: Prolia 60mg     Diagnosis: OSTEOPOROSIS (ICD-733.00)    Route: SQ    Site: right arm     Exp Date: 05/2011    Lot #: [id number]    Mfr: amgen    Patient tolerated injection without complications    Given by: Ami Bullins CMA (October 13, 2009 10:30 AM)  Orders Added: 1)  Admin of Therapeutic Inj  intramuscular or subcutaneous [96372] 2)  Prolia 60mg  [J3590]

## 2010-07-31 NOTE — Progress Notes (Signed)
Summary: Synthroid  Phone Note Outgoing Call   Reason for Call: Discuss lab or test results Summary of Call: please call patient - Vit d level is normal  Thank you Initial call taken by: Jacques Navy MD,  July 09, 2009 5:06 PM  Follow-up for Phone Call        lmoam for pt to call back Follow-up by: Ami Bullins CMA,  July 10, 2009 8:35 AM  Additional Follow-up for Phone Call Additional follow up Details #1::        informed pt on vitd level. Pt wants to know if she should continue her synthroid 100 micrograms if so she needs 90 sent to [facility] [address] in [place] Additional Follow-up by: Ami Bullins CMA,  July 10, 2009 3:46 PM    Additional Follow-up for Phone Call Additional follow up Details #2::    continue synthroid . OK for #90 3 refills Follow-up by: Jacques Navy MD,  July 10, 2009 5:49 PM  Additional Follow-up for Phone Call Additional follow up Details #3:: Details for Additional Follow-up Action Taken: Patient notified. Additional Follow-up by: Lucious Groves,  July 12, 2009 10:19 AM  Prescriptions: SYNTHROID 100 MCG TABS (LEVOTHYROXINE SODIUM) 1 by mouth once daily  #90 x 3   Entered by:   Lucious Groves   Authorized by:   Jacques Navy MD   Signed by:   Lucious Groves on 07/12/2009   Method used:   Electronically to        [facility] # [phone]* (retail)       [address]       [place], [place]  [address]       Ph: [phone]       Fax: [phone]   RxID:   [id number]

## 2010-07-31 NOTE — Cardiovascular Report (Signed)
Summary: Office Visit   Card Device [facility]   Imported By: Roderic Ovens 12/12/2009 10:57:26  _____________________________________________________________________  External Attachment:    Type:   Image     Comment:   External Document

## 2010-07-31 NOTE — Medication Information (Signed)
Summary: ProliaPlus  ProliaPlus   Imported By: Lester [place] 10/11/2009 09:52:21  _____________________________________________________________________  External Attachment:    Type:   Image     Comment:   External Document

## 2010-07-31 NOTE — Progress Notes (Signed)
Summary: Prolia denied  Phone Note Other Incoming   Summary of Call: I spoke with Prolia rep yesterday and was aware that Prolia has been denied. He is not given patient names, but told me to re-submit and be sure to use 2 diagnosis codes. We rec'd denial for this patient, so I did as rep explained, and re-submitted Prolia paperwork. Initial call taken by: Lucious Groves,  September 19, 2009 1:54 PM

## 2010-07-31 NOTE — Progress Notes (Signed)
Summary: Prolia  Phone Note Other Incoming   Summary of Call: Patient is due for Prolia inj. Patient will pay $0 oop. Left message on machine to call back to office. Initial call taken by: Lucious Groves,  September 28, 2009 8:59 AM  Follow-up for Phone Call        informed pt and she will come on april 12 Follow-up by: Ami Bullins CMA,  September 28, 2009 1:18 PM

## 2010-07-31 NOTE — Medication Information (Signed)
Summary: Denial/proliaplus  Denial/proliaplus   Imported By: Lester [place] 10/11/2009 09:49:39  _____________________________________________________________________  External Attachment:    Type:   Image     Comment:   External Document

## 2010-07-31 NOTE — Procedures (Signed)
Summary: PER CHECK OUT/SF   Current Medications (verified): 1)  Synthroid 100 Mcg Tabs (Levothyroxine Sodium) .[name].. 1 By Mouth Once Daily 2)  Multivitamins   Caps (Multiple Vitamin) .... Once Daily 3)  Calcium Carbonate-Vitamin D 600-400 Mg-Unit  Tabs (Calcium Carbonate-Vitamin D) .... Take 2 Two Times A Day 4)  Lisinopril-Hydrochlorothiazide 20-12.5 Mg Tabs (Lisinopril-Hydrochlorothiazide) .... Take 1 Tablet By Mouth Twice Daily 5)  Nexium 40 Mg  Cpdr (Esomeprazole Magnesium) .... Q Am As Needed 6)  Prolia 60 Mg/ml Soln (Denosumab) .... One Inject. Every 6 Months 7)  Ibuprofen 200 Mg Tabs (Ibuprofen) .... 3 Tabs As Needed 8)  Amlodipine Besylate 2.5 Mg Tabs (Amlodipine Besylate) .... Take One Tablet By Mouth Daily  Allergies (verified): 1)  ! Codeine  PPM Specifications Following MD:  Sherryl Manges, MD     PPM Vendor:  Medtronic     PPM Model Number:  [id number]     PPM Serial Number:  [id number] PPM DOI:  05/11/2003      Lead 1    Location: atrial     DOI: 05/11/2003     Model #: 1642     Serial #: [id number]     Status: active Lead 2    Location: RV     DOI: 05/11/2003     Model #: 1646     Serial #: [id number]     Status: active  Magnet Response Rate:  BOL 85 ERI 65  Indications:  complete heart block   PPM Follow Up Battery Voltage:  2.70 V     Battery Est. Longevity:  16 mths     Pacer Dependent:  Yes       PPM Device Measurements Atrium  Amplitude: 2.80 mV, Impedance: 620 ohms, Threshold: 0.50 V at 0.40 msec Right Ventricle  Amplitude: 31.36 mV, Impedance: 650 ohms, Threshold: 1.00 V at 0.40 msec  Episodes MS Episodes:  59     Percent Mode Switch:  <0.1%     Coumadin:  No Ventricular High Rate:  0     Atrial Pacing:  0.3%     Ventricular Pacing:  99.9%  Parameters Mode:  DDD     Lower Rate Limit:  60     Upper Rate Limit:  130 Paced AV Delay:  150     Sensed AV Delay:  120 Next Cardiology Appt Due:  10/30/2010 Tech Comments:  59 MODE SWITCHES.  NORMAL DEVICE FUNCTION.  NO  CHANGES MADE. ROV IN 6 MTHS W/SK. Vella Kohler  May 17, 2010 10:20 AM

## 2010-07-31 NOTE — Progress Notes (Signed)
Summary: Synthroid  Phone Note Call from Patient   Summary of Call: Patient is requesting to know if she should continue synthroid ? or take ?  Initial call taken by: Lamar Sprinkles, CMA,  July 03, 2009 1:22 PM  Follow-up for Phone Call        based on last TSH Nov 16th of 0.03 recommend she continue on Synthroid . Will need f/u TSH 244.9 Follow-up by: Jacques Navy MD,  July 03, 2009 6:15 PM

## 2010-08-02 NOTE — Assessment & Plan Note (Signed)
Summary: ROV   Visit Type:  Follow-up Primary Provider:  Norins  CC:  No cardiac complaints.  History of Present Illness: Doing well overall.  No complaints whatsoever.  Feels good.  BP is better since medication adjustment by Dr. Graciela Husbands.  No chest pain or shortness of breath.  Last echo of 2009 reviewed.    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 1 By Mouth Once Daily 2)  Multivitamins   Caps (Multiple Vitamin) .... Once Daily 3)  Calcium Carbonate-Vitamin D 600-400 Mg-Unit  Tabs (Calcium Carbonate-Vitamin D) .... Take 2 Tablets Daily 4)  Lisinopril-Hydrochlorothiazide 20-12.5 Mg Tabs (Lisinopril-Hydrochlorothiazide) .... Take 1 Tablet By Mouth Twice Daily 5)  Nexium 40 Mg  Cpdr (Esomeprazole Magnesium) .... Q Am As Needed 6)  Prolia 60 Mg/ml Soln (Denosumab) .... One Inject. Every 6 Months 7)  Ibuprofen 200 Mg Tabs (Ibuprofen) .... As Needed 8)  Amlodipine Besylate 2.5 Mg Tabs (Amlodipine Besylate) .... Take One Tablet By Mouth Daily 9)  Coricidin Hbp Cold/flu 2-325 Mg Tabs  (Chlorpheniramine-Acetaminophen) .... As Needed  Allergies: 1)  ! Codeine  Vital Signs:  Patient profile:   70 year old female Height:      65 inches Weight:      137.50 pounds BMI:     22.96 Pulse rate:   67 / minute Pulse rhythm:   regular Resp:     18 per minute BP sitting:   130 / 80  (left arm) Cuff size:   large  Vitals Entered By: Vikki Ports (June 22, 2010 8:55 AM)  Physical Exam  General:  Well developed, well nourished, in no acute distress. Head:  normocephalic and atraumatic Eyes:  PERRLA/EOM intact; conjunctiva and lids normal. Neck:  Goiter Lungs:  Clear bilaterally to auscultation and percussion. Heart:  PMI non displaced.  No significant murmur. Pulses:  pulses normal in all 4 extremities Extremities:  No clubbing or cyanosis.  No edema noted Neurologic:  Alert and oriented x 3.   EKG  Procedure date:  06/22/2010  Findings:      Atrial tracking and ventricular pacing.    PPM Specifications Following MD:  Sherryl Manges, MD     PPM Vendor:  Medtronic     PPM Model Number:  [id number]     PPM Serial Number:  [id number] PPM DOI:  05/11/2003      Lead 1    Location: atrial     DOI: 05/11/2003     Model #: 1642     Serial #: [id number]     Status: active Lead 2    Location: RV     DOI: 05/11/2003  Model #: [id number]     Serial #: [id number]     Status: active  Magnet Response Rate:  BOL 85 ERI 65  Indications:  complete heart block   PPM Follow Up Pacer Dependent:  Yes      Episodes Coumadin:  No  Parameters Mode:  DDD     Lower Rate Limit:  60     Upper Rate Limit:  130 Paced AV Delay:  150     Sensed AV Delay:  120  Impression & Recommendations:  Problem # 1:  HYPERTENSION (ICD-401.9) Needs BMET.  Better control on current therapy. Her updated medication list for this problem includes:    Lisinopril-hydrochlorothiazide 20-12.5 Mg Tabs (Lisinopril-hydrochlorothiazide) .[name]... Take 1 tablet by mouth twice daily    Amlodipine Besylate 2.5 Mg Tabs  (Amlodipine besylate) .[name]... 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  Dr. Graciela Husbands following.   Patient Instructions: 1)  Your physician recommends that you have lab work today: BMP 2)  Your physician recommends that you continue on your current medications as directed. Please refer to the Current Medication list given to you today. 3)  Your physician wants you to follow-up in:  1 YEAR.  You will receive a reminder letter in the mail two months in advance. If you don't receive a letter, please call our office to schedule the follow-up appointment.

## 2010-08-02 NOTE — Progress Notes (Signed)
Summary: Question need for SBE  Phone Note Call from Patient Call back at Home Phone [phone]   Caller: Patient Summary of Call: pt is having dental work tomorrow pt wants know if she needs to take meds.  Initial call taken by: Roe Coombs,  July 11, 2010 9:34 AM  Follow-up for Phone Call        I spoke with the pt and made her aware that she does not require SBE.  The pt has a history of mild MR and pacemaker. Follow-up by: Julieta Gutting, RN, BSN,  July 11, 2010 9:45 AM

## 2010-10-03 ENCOUNTER — Telehealth: Payer: Self-pay | Admitting: *Deleted

## 2010-10-03 NOTE — Telephone Encounter (Signed)
Will have med ordered to have it here by 10-09-10.  I previously informed pt that she would owe her $140 deduct and 20% co-ins (approx $165).  She is questioning this and states she spoke to Medicare and they state she doesn't have to pay this. I will provide her with a copy of the benefit summary from Prolia[employer] stating this info when she comes for her inj.

## 2010-10-09 ENCOUNTER — Ambulatory Visit (INDEPENDENT_AMBULATORY_CARE_PROVIDER_SITE_OTHER): Payer: Medicare Other | Admitting: *Deleted

## 2010-10-09 DIAGNOSIS — M810 Age-related osteoporosis without current pathological fracture: Secondary | ICD-10-CM

## 2010-10-09 LAB — CK TOTAL AND CKMB (NOT AT ARMC): Relative Index: 1.8 (ref 0.0–2.5)

## 2010-10-09 LAB — CARDIAC PANEL(CRET KIN+CKTOT+MB+TROPI)
CK, MB: 1.5 ng/mL (ref 0.3–4.0)
Relative Index: INVALID (ref 0.0–2.5)
Total CK: 73 U/L (ref 7–177)
Troponin I: 0.01 ng/mL (ref 0.00–0.06)
Troponin I: 0.01 ng/mL (ref 0.00–0.06)

## 2010-10-09 LAB — TSH: TSH: 0.042 u[IU]/mL — ABNORMAL LOW (ref 0.350–4.500)

## 2010-10-09 LAB — CBC
HCT: 34.4 % — ABNORMAL LOW (ref 36.0–46.0)
Hemoglobin: 12.4 g/dL (ref 12.0–15.0)
MCHC: 34.3 g/dL (ref 30.0–36.0)
MCV: 82.1 fL (ref 78.0–100.0)
MCV: 82.5 fL (ref 78.0–100.0)
Platelets: 125 10*3/uL — ABNORMAL LOW (ref 150–400)
RBC: 4.41 MIL/uL (ref 3.87–5.11)
RDW: 12.7 % (ref 11.5–15.5)
WBC: 5.1 10*3/uL (ref 4.0–10.5)

## 2010-10-09 LAB — DIFFERENTIAL
Eosinophils Absolute: 0 10*3/uL (ref 0.0–0.7)
Lymphs Abs: 1.2 10*3/uL (ref 0.7–4.0)
Monocytes Absolute: 0.4 10*3/uL (ref 0.1–1.0)
Monocytes Relative: 8 % (ref 3–12)
Neutrophils Relative %: 68 % (ref 43–77)

## 2010-10-09 LAB — POCT I-STAT, CHEM 8
BUN: 18 mg/dL (ref 6–23)
Chloride: 106 mEq/L (ref 96–112)
Glucose, Bld: 95 mg/dL (ref 70–99)
HCT: 37 % (ref 36.0–46.0)
Potassium: 4 mEq/L (ref 3.5–5.1)

## 2010-10-09 LAB — POCT CARDIAC MARKERS
CKMB, poc: <1 ng/mL — ABNORMAL LOW (ref 1.0–8.0)
Troponin i, poc: <0.05 ng/mL (ref 0.00–0.09)

## 2010-10-09 LAB — LIPID PANEL: VLDL: 7 mg/dL (ref 0–40)

## 2010-10-09 LAB — PROTIME-INR: INR: 1.1 (ref 0.00–1.49)

## 2010-10-09 LAB — APTT: aPTT: 29 seconds (ref 24–37)

## 2010-10-09 MED ORDER — DENOSUMAB 60 MG/ML ~~LOC~~ SOLN
60.0000 mg | Freq: Once | SUBCUTANEOUS | Status: AC
  Administered 2010-10-09: 60 mg via SUBCUTANEOUS

## 2010-11-13 NOTE — Discharge Summary (Signed)
NAMEJANECE, [name]               ACCOUNT NO.:  [id number]   MEDICAL RECORD NO.:  [id number]          PATIENT TYPE:  INP   LOCATION:  3738                         FACILITY:  [facility]   PHYSICIAN:  Doylene Canning. Ladona Ridgel, MD    DATE OF BIRTH:  28-Jun-1941   DATE OF ADMISSION:  11/11/2008  DATE OF DISCHARGE:  11/12/2008                               DISCHARGE SUMMARY   PRIMARY CARDIOLOGIST:  Maisie Fus D. Riley Kill, MD, [facility]   DISCHARGING DIAGNOSIS:  Chest pain, felt to be atypical for cardiac  origin.   The patient with 12-lead EKG showing ventricular pacing and negative  cardiac enzymes.  The patient is scheduled to follow up with Dr. Riley Kill  for previously scheduled appointment.   PAST MEDICAL HISTORY:  1. Chest pain status post negative stress Myoview in April 2009.  2. 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  3. Paroxysmal atrial fib.  4. Hypothyroidism with goiter.  5. Hypertension.  6. Depression.  7. Osteoporosis.  8. Right middle lobe lung nodule, followed on CT.  9. Hiatal hernia.  10.Irritable bowel syndrome.  11.GERD.  12.Remote tobacco abuse.  13.Status post ventral hernia repair.  14.EF 50-55% with mild MR by echo in May 2009.   HOSPITAL COURSE:  [name] is a 70 year old female without prior  history of coronary artery disease, previous stress Myoview negative,  presented on day of admission complaining of chest pain while cutting a  pineapple.  She developed left upper chest dull ache around her  pacemaker site with soreness to palpation.  This continued for  approximately 3 hours.  She called our office and was instructed to call  911.  When EMS arrived, they told her to take 4 baby aspirin.  EMS found  the patient reportedly hypertensive with a systolic blood pressure of  190.  She was given 1 sublingual nitroglycerin with reduction in her  dull aching from 7 to 3.  She was given second sublingual nitroglycerin  without  any further change.  She was taken to [facility].  Cardiac  markers were negative.  EKG showed a paced rhythm without acute changes.  Discomfort the patient described is tenderness around her left upper  chest pacemaker site.  The patient remained stable on telemetry unit.  Dr. Ladona Ridgel in to see the patient the following morning.  Chest pain much  improved.  The patient ambulated in the hall without any chest  discomfort.  The patient was discharged home in stable condition.   At the time of discharge, she is instructed to continue her previous  medications including:  1. Synthroid 112 mcg daily.  2. Multivitamin daily.  3. Calcium plus vitamin D daily.  4. Lisinopril 40 mg daily.  5. Lasix 40 mg daily.  6. Nexium 40 mg p.r.n.   The patient is to call office and confirm followup appointment as the  office is not open at this time.   DURATION OF DISCHARGE ENCOUNTER:  Less than 30 minutes.      Dorian Pod, ACNP  Doylene Canning. Ladona Ridgel, MD  Electronically Signed    MB/MEDQ  D:  12/12/2008  T:  12/13/2008  Job:  [id number]

## 2010-11-13 NOTE — Assessment & Plan Note (Signed)
[facility]                                 ON-CALL NOTE   NAME:YOUNGThekla, Colborn                      MRN:          [id number]  DATE:10/18/2007                            DOB:          25-Oct-1940    Telephone contact note dated October 18, 2007.   PRIMARY CARDIOLOGIST:  Arturo Morton. Riley Kill, MD, [facility]   ELECTROPHYSIOLOGIST:  Duke Salvia, MD, [facility]   Ms. Dragos is a 70 year old female with a history of complete heart block  status post permanent pacemaker.  Her daughter called because Ms. Yanik  had given her a phone call at work saying that she was very short of  breath.   I advised Ms. Kuhlman's daughter that her mother should call 9-1-1 and  come to the emergency room.  I called Ms. Reames herself at home and  advised her of the same thing.  In talking to her she did indeed seem  very short of breath and was unable to complete short sentences without  stopping for breath.  She stated that she would hang up with me and call  9-1-1 as requested.      Theodore Demark, PA-C  Electronically Signed      Gerrit Friends. Dietrich Pates, MD, [facility]  Electronically Signed   RB/MedQ  DD: 10/18/2007  DT: 10/18/2007  Job #: [id number]

## 2010-11-13 NOTE — H&P (Signed)
[name], [name]               ACCOUNT NO.:  [id number]   MEDICAL RECORD NO.:  [id number]          PATIENT TYPE:  EMS   LOCATION:  MAJO                         FACILITY:  [facility]   PHYSICIAN:  Unice Cobble, MD     DATE OF BIRTH:  11-Feb-1941   DATE OF ADMISSION:  10/18/2007  DATE OF DISCHARGE:                              HISTORY & PHYSICAL   CARDIOLOGISTS:  1. Arturo Morton. Riley Kill, MD, [facility]  2. Duke Salvia, MD, [facility]   PRIMARY CARE PHYSICIAN:  Rosalyn Gess. Norins, M.D.   CHIEF COMPLAINT:  Shortness of breath with chest pain.   HISTORY OF PRESENT ILLNESS:  This is a 70 year old African-American  female with a history of third-degree heart block status post pacemaker  and hypertension who presents with shortness of breath and chest pain.  The patient was in a normal state of health, walking up to 1 hour a day,  until Friday.  On Friday, she cleaned her carpet without issue.  Yesterday, she felt extremely fatigued and short of breath all day.  Her  shortness of breath was insidious in onset and made worse with exertion.  It never really went away, and then today, after waking up at 8 a.m.,  she also had some mild substernal chest pain.  Her chest pain did not  radiate and was characterized as mild.  She also had some presyncope.  She denies nausea and vomiting.  She also said that it felt like her  chest was vibrating at times.  No edema, PND or orthopnea.  She tells me  that her symptoms are similar to when her pacemaker was initially  introduced.   PAST MEDICAL HISTORY:  1. History of chest pain with unclear etiology.  She had a negative      Cardiolite test approximately 3 years ago.  2. History of third-degree heart block status post Medtronic pacemaker      in November of 2004.  3. History of right middle lobe lung nodule which has been unchanged      on CT scan to date.  4. History of superficial thrombophlebitis.  5. Hiatal hernia.  6. Hypertension.  7. Depression.  8. Osteoporosis.  9. History of hernia repair.  10.History of hypothyroidism.  11.Irritable bowel syndrome.   ALLERGIES:  CODEINE causes nausea.   MEDICATIONS:  1. Lisinopril 40 mg daily.  2. Furosemide 20 mg daily.  3. Synthroid 112 mcg daily.  4. Calcium.  5. Vitamin D.   SOCIAL HISTORY:  She lives in [place] with her daughter.  She has  remote tobacco use, quitting 30 years ago.  No alcohol or drugs.  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   FAMILY HISTORY:  Mother died at the age of 63 of unclear causes.  It was  thought that she had some heart disease at the time.  Her father died at  the age of 50 of congestive heart failure.  She has a sister who has  diverticulosis and two brothers with hypertension.   REVIEW OF SYSTEMS:  Complete review  of systems was done and found to be  otherwise negative, except as stated in the HPI.   PHYSICAL EXAMINATION:  VITAL SIGNS:  Her temperature is 97.8 with a  pulse of 74, respiratory rate of 16, blood pressure upon admit was  174/86, and it is now down to 154/63.  O2 saturations were 100% on 2  liters.  GENERAL:  This is a thin African-American female in no acute distress.  She appears younger than her stated age.  HEENT:  Pupils are equal, round and reactive to light and accommodation.  Extraocular movements intact.  Moist mucous membranes.  Oropharynx is  without erythema or exudates.  NECK:  Supple without lymphadenopathy, thyromegaly, bruits, jugular  venous distention.  ABDOMEN:  Soft and nontender without rebound or guarding.  Normal bowel  sounds.  Negative hepatosplenomegaly.  HEART:  Regular rate and rhythm with normal S1 and S2.  No murmurs,  gallops or rubs.  Normal PMI.  Pulses 2+ and equal bilaterally without  bruits.  LUNGS:  Clear to auscultation bilaterally without wheezes, rhonchi or  rales.  EXTREMITIES:  No cyanosis, clubbing or edema.  MUSCULOSKELETAL:  No joint deformity or effusions.   No spine or CVA  tenderness.  NEUROLOGICALLY:  She is alert and oriented x3 with cranial nerves II-XII  grossly intact.  Strength is 5/5 in all extremities and axial groups  with normal sensation throughout.   RADIOLOGY:  Chest x-ray shows large lung fields consistent with COPD.  There is no acute cardiopulmonary disease.  There is, to my read, a  possible pacemaker lead fracture.  Although this was not called by the  radiologist, there is significant shadowing over the lead.   EKG revealed a rate of 72 with normal sinus rhythm and a DDD pacer  tracking normal sinus rhythm.   LABORATORY DATA:  White blood cell count of 6.1 with a hemoglobin of  12.9 and a platelet count of 199,000.  Creatinine is 0.9.  Glucose is  90.  CK-MB and troponin are both negative.   ASSESSMENT/PLAN:  This is a 70 year old African-American female with a  history of hypertension and third-degree heart block status post  pacemaker with shortness of breath and chest pain.  The possible  etiologies include angina versus pain with hypertensive urgency versus  possibly a primary pulmonary process.  1. Her chest pain and shortness of breath will be ruled out for      myocardial infarction with serial enzymes x3.  She will be made      n.p.o. overnight for possible stress in the morning.  2. Hypertension:  She will be started on metoprolol 12.5 mg q.6h. on      top of her lisinopril and furosemide.  She will need medication      adjustment upon discharge.  3. Pulmonary/shortness of breath:  There are emphysematous changes on      her x-ray.  It is possible she does have an element of chronic      obstructive pulmonary disease, and PFTs may need to be done in the      future.  A D-dimer will be checked.  4. Check TSH.  5. Her chest x-ray has a shadow over the pacemaker lead which is      concerning for lead fracture.  She will need her pacemaker      interrogated in the morning to see if her impedance has  changed.      Unice Cobble, MD  Electronically  Signed     ACJ/MEDQ  D:  10/18/2007  T:  10/18/2007  Job:  [id number]

## 2010-11-13 NOTE — Letter (Signed)
June 13, 2008    Rosalyn Gess. Norins, MD  [address]  [place], [place] [address]   RE:  TAQUISHA, PHUNG  MRN:  [id number]  /  DOB:  01-08-1941   Dear Casimiro Needle:   A nice patient, Varnika Butz had thyroid studies done over at [facility]  [facility]iology.  We infacted this in part because she had a small degree of  mode switching on her pacemaker suggesting brief periods of atrial  fibrillation.  Her thyroid studies did reveal a TSH of 0.05 with a free  T4 of 1.4.  As a result, I made the decision to decrease her thyroid  hormone from 112 mcg down to 100 mcg a day.  I believe your note  mentions that she is on 0.1 mg a day, but we believe in fact she was  taking 0.112.  Hopefully, this will result in normalization of her TSH,  and potentially cut down her mode switch.  We do plan to see her back in  followup in about 3 months, at which time we will reassess whether or  not she is having any mode switches, and also reassess her thyroid  status.  I appreciate the opportunity of sharing in her care.    Sincerely,      Arturo Morton. Riley Kill, MD, [facility]  Electronically Signed    TDS/MedQ  DD: 06/13/2008  DT: 06/14/2008  Job #: [id number]

## 2010-11-13 NOTE — Discharge Summary (Signed)
[name], [name]               ACCOUNT NO.:  [id number]   MEDICAL RECORD NO.:  [id number]          PATIENT TYPE:  INP   LOCATION:  2033                         FACILITY:  [facility]   PHYSICIAN:  Daisy Morton. Riley Kill, MD, FACCDATE OF BIRTH:  1941-03-08   DATE OF ADMISSION:  10/18/2007  DATE OF DISCHARGE:  10/20/2007                               DISCHARGE SUMMARY   PRIMARY CARDIOLOGIST:  Daisy Fus D. Riley Kill, MD, [facility].   ELECTROPHYSIOLOGIST:  Daisy Salvia, MD, [facility].   PRIMARY CARE [name]:  Daisy Gess. Norins, MD.   DISCHARGE DIAGNOSIS:  Chest pain.   SECONDARY DIAGNOSES:  1. Complaints of head congestion.  2. Hypothyroidism with over-replacement requiring downward titration      of Synthroid.  3. History of third-degree heart blocks status post Medtronic      pacemaker, in November 2004.  4. History of right middle lobe lung nodule, which was followed by CT.  5. History of superficial thrombophlebitis.  6. Hiatal hernia.  7. Hypertension.  8. Depression.  9. Osteoporosis.  10.History of a hernia repair.  11.History of hypothyroidism.  12.Irritable bowel syndrome.   ALLERGIES:  Codeine causes nausea.   PROCEDURES:  Lexiscan/Myoview.   HISTORY OF PRESENT ILLNESS:  A 70 year old [place] American female with  the above problems.  She was in her usual state of health until October 17, 2007, when she noticed fatigue and dyspnea, that was worse with  exertion.  On October 18, 2007, she noted some mild substernal chest  discomfort upon awakening, and she subsequently presented to the [facility] ED.  In the ED, cardiac markers were negative.  An ECG showed a  paced rhythm without acute ST-T wave changes.  She was admitted for  further evaluation to rule out.   HOSPITAL COURSE:  [name] ruled out for MI.  She had no additional  chest discomfort, but did complain of head congestion.  Sinus films were  obtained on October 19, 2007, and showed no suggestion of acute or chronic  sinusitis.  PFTs were also performed, revealing an FVC of 3.25 (114%),  FEV1 of 2.03 (99%), and FEV1/FVC of 62%.  FEF, 25-25.88 (38%).  The  findings were suggestive of minimal obstructive airway disease.  Because  of her chest discomfort, she underwent Lexiscan/Myoview, on October 20, 2007.  This showed no evidence of ischemia with normal LV function.  [name] is being discharged home today, in satisfactory condition.   DISCHARGE LABS:  Hemoglobin 13.7, hematocrit 39.5, WBC 5.6, and  platelets 198.  INR 1.1.  D-dimer 0.34.  Sodium 141, potassium 4.0,  chloride 106, CO2 27, BUN 13, creatinine 0.81, glucose 108, calcium 9.3,  and magnesium 2.0.  CK 59, MB 1.5, and troponin I less than 0.01.  TSH  0.064.   DISPOSITION:  The patient is being discharged home today in good  condition.   FOLLOWUP PLANS AND APPOINTMENTS:  1. We have arranged for a followup with Dr. Illene [name], on October 26, 2007, at 4:35 p.m.  2. She is to followup with Dr.  Stuckey on December 02, 2007, at 4:15 p.m.   DISCHARGE MEDICATIONS:  1. Lisinopril 40 mg daily.  2. Lasix 20 mg daily.  3. Synthroid 100 mcg daily.  4. Aspirin 81 mg daily.  5. Calcium and vitamin D daily.   OUTSTANDING LABORATORY STUDIES:  None.   DURATION DISCHARGE ENCOUNTER:  40 minutes, including physician time.      [name], ANP      Daisy Morton. Riley Kill, MD, [facility]  Electronically Signed    CB/MEDQ  D:  10/20/2007  T:  10/21/2007  Job:  [id number]   cc:   Daisy Gess. Norins, MD

## 2010-11-13 NOTE — Assessment & Plan Note (Signed)
[facility]                         ELECTROPHYSIOLOGY OFFICE NOTE   NAME:Saintjean, JARELI HIGHLAND                      MRN:          [id number]  DATE:04/02/2007                            DOB:          08/07/1940    Ms. Pridmore was seen today in the clinic on April 02, 2007 for a followup  of her Medtronic model number 901 Tappa.  Date of implant was May 11, 2003 for complete heart block.   On interrogation of her device today, her battery voltage is 2.76 with  an estimated longevity of 4-1/2 years.  P waves measured 2.8 to 4 mV  with an atrial capture threshold of 0.5 volts at 0.4 msec and an atrial  lead impedance of 599 ohms.  R waves were not measured.  She is  pacemaker-dependent to a rate of 30 with a ventricular capture threshold  of 0.75 volts at 0.4 msec and a ventricular lead impedance of 654 ohms.  There were 13 mode-switch episodes noted totaling less than 0.1% of the  time.  She is not on Coumadin therapy.  No changes were made in her  parameters.  She is, as I said, dependent to a rate of 30, so she is  ventricularly pacing 10% of the time.  She will continue with her  [employer] transmissions and return in 6 months' time.      Altha Harm, LPN  Electronically Signed      Duke Salvia, MD, [facility]  Electronically Signed   PO/MedQ  DD: 04/02/2007  DT: 04/03/2007  Job #: [id number]

## 2010-11-13 NOTE — Assessment & Plan Note (Signed)
[facility]                            CARDIOLOGY OFFICE NOTE   NAME:Eley, BRIEA MCENERY                      MRN:          [id number]  DATE:06/10/2008                            DOB:          [birth year]    Ms. Pang is in for followup.  In general, she is really doing pretty  well.  She does not have any major complaints.  I did call Dr. Debby Bud  today to confirm in fact that she had indeed had been followed up on her  thyroid.  She did.  She denies any ongoing symptoms.   Current medications include:  1. Synthroid 112 mcg daily.  2. Multivitamin daily.  3. Calcium D.  4. Lisinopril 40 mg daily.  5. Furosemide 40 mg daily.   On physical today, the blood pressure was 170/90, on repeat the blood  pressure was 130/70, pulse was 79.  The lung fields are actually clear.  The cardiac exam is unremarkable.   The patient's last echocardiogram was in 2009.  At that time, there was  mild mitral valve regurgitation.  There was some dyssynergia of the  septum with EF of 50-55%, but the rhythm was paced.   Pacemaker evaluation today reveals about 7 mode switches.   Overall, the patient has been stable.  We will check her thyroid  function to make sure in fact that she is not hyperthyroid.  If she has  some mild mode switches and is mildly hyperthyroid, then we would likely  cut back on her thyroid medication.  Cardiacwise, she has been stable,  and we will again plan to see her back in follow up in 6 months.     Arturo Morton. Riley Kill, MD, [facility]  Electronically Signed    TDS/MedQ  DD: 06/10/2008  DT: 06/10/2008  Job #: [id number]

## 2010-11-13 NOTE — Assessment & Plan Note (Signed)
[place]                         ELECTROPHYSIOLOGY OFFICE NOTE   NAME:[name], Daisy FUSARO                      MRN:          [id number]  DATE:11/03/2007                            DOB:          Apr 23, 1941    [name] is seen in followup for pacemaker implant about 5-1/2 years  ago for intermittent complete heart block which has now evolved to  complete heart block.  She has no symptoms of exercise intolerance.  She  was recently hospitalized for chest pain; and it is felt at the end,  that this may have been related to an exposure to a cleaning chemical.   CURRENT MEDICATIONS INCLUDE:  Synthroid, lisinopril and furosemide.   PHYSICAL EXAMINATION:  Her blood pressure is well-controlled at 122/80  with a pulse was 76.  LUNGS:  Clear.  HEART:  Sounds were regular.  EXTREMITIES:  Without edema.   INTERROGATION:  Of her Medtronic pacemaker demonstrates a P wave of 2.8  with an impedance of 599, and a threshold 0.5 at 0.4.  There was no  intrinsic ventricular rhythm, impedance was 658 and threshold was 0.75  at 0.4.   IMPRESSION:  1. Complete heart block.  2. Status post pacer for the above.   [name] pacemaker is functioning normally in the setting of complete  heart block.  We will see her, again, in 1 year's time, and she will be  followed remotely.     Duke Salvia, MD, [facility]  Electronically Signed    SCK/MedQ  DD: 11/03/2007  DT: 11/03/2007  Job #: [id number]

## 2010-11-13 NOTE — H&P (Signed)
NAMESHANTY, [name]               ACCOUNT NO.:  [id number]   MEDICAL RECORD NO.:  [id number]          PATIENT TYPE:  INP   LOCATION:  3738                         FACILITY:  [facility]   PHYSICIAN:  Bruce R. Juanda Chance, MD, FACCDATE OF BIRTH:  10-07-1940   DATE OF ADMISSION:  11/11/2008  DATE OF DISCHARGE:                              HISTORY & PHYSICAL   PRIMARY CARDIOLOGIST:  Arturo Morton. Riley Kill, MD, [facility]   PRIMARY CARE Bryli Mantey:  Rosalyn Gess. Norins, MD.   PATIENT PROFILE:  A 70 year old female without prior history of CAD who  presents with reproducible left-sided chest discomfort with radiation  down to the left arm into the neck, associated dyspnea.   PROBLEMS:  1. Chest pain.      a.     Status post negative for [facility] on October 20, 2007.  2. History of complete heart block.      a.     Status post Medtronic permanent pacemaker in 2004.  3. Paroxysmal atrial fibrillation with mode switching recently noted      on pacemaker interrogation.  4. Hypothyroidism with goiter.  5. Hypertension.  6. Depression.  7. Osteoporosis.  8. Status post ventral hernia repair.  9. Right middle lobe lung nodule followed on CT.  10.Hiatal hernia.  11.Irritable bowel syndrome.  12.GERD.  13.Remote tobacco abuse.  14.EF 50-55% with mild MR by echo in May 2009.   HISTORY OF PRESENT ILLNESS:  A 70 year old female without prior history  of CAD.  She had a negative Myoview in May 2009.  She was in her usual  state of health until this morning at 10 a.m. when while cutting a  pineapple she developed 7/10 left upper chest dull aching around her  pacemaker site with soreness to palpation.  Discomfort, however, also  radiated down to her left arm and she noted tingling in her left jaw and  cheek.  She had mild associated dyspnea and nausea.  After approximately  a 3-hour of ongoing symptoms, she called our office and was advised to  call EMS.  When she called the EMS, they told her to take 4 baby  aspirin.  She took 1 and shortly thereafter EMS arrived.  On their  arrival, she was reportedly hypertensive with a systolic blood pressure  of 190 (by the patient's report) and she was given 1 sublingual  nitroglycerin with reduction in her dull aching to 3-4/10.  She was  given a second sublingual nitroglycerin without any change.  She was  taken to the [facility] ED where so far cardiac marker is negative and  EKG shows a paced  rhythm without acute changes.  She currently reports  a 3-4/10 discomfort in her left upper chest, but states that she has  been in the ED it has been intermittent occurring 15 minutes at a time  and then resolving for 20 minutes prior to recurring.  The discomfort is  she does have tenderness around her left upper chest pacemaker site.   ALLERGIES:  CODEINE causes nausea.   HOME MEDICATIONS:  Synthroid 112 mcg daily, multivitamin daily,  calcium  plus D daily, lisinopril 40 mg daily, Lasix 40 mg daily, and Nexium 40  mg p.r.n.   FAMILY HISTORY:  Mother died at age 57, possibly of CAD.  Father died at  48 of heart failure.  She has 2 brothers with hypertension and a sister  with diverticulosis.   SOCIAL HISTORY:  She lives in [place] with her daughter.  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  She  previously  smoked for about 15 years, quit 30 years ago.  She denies  alcohol or drug use.  She walks 5 days a week 1 hour at a time without  symptoms or limitations.   REVIEW OF SYSTEMS:  Positive for chest pain, dyspnea as outlined in the  HPI.  She also reports occasional dark stools and she did have a dark  stool earlier today.  She is a full code.  Otherwise, all systems  reviewed and negative.   PHYSICAL EXAMINATION:  VITAL SIGNS:  Temperature 97.8, heart rate 77,  respirations 18, blood pressure 112/89, and pulse ox 100% on 2 liters.  GENERAL:  Pleasant female in no acute distress.  Awake, alert, and  oriented x3.   Normal affect.  HEENT:  Normal.  Nares grossly intact and nonfocal.  SKIN:  Warm and dry without lesions or masses.  MUSCULOSKELETAL:  Grossly normal without deformity or effusion.  NECK:  Supple without bruits or JVD.  LUNGS:  Respirations regular and unlabored.[name]  CARDIAC:  Regular S1 and S2 with a 2/6 systolic murmur at the apex.  ABDOMEN:  Round, soft, nontender, nondistended.  Bowel sounds present  x4.  Her left upper chest wall is notable for pacemaker.  The pacer site  is without swelling, erythema, or heat.  She is mildly tender to  palpation over that area.  EXTREMITIES:  Warm and dry.  No clubbing, cyanosis, or edema.  Dorsalis  pedis, posterior tibial pulses 2+ and equal bilaterally.   Chest x-ray shows COPD/emphysema and goiter.  EKG shows ventricular  paced  rhythm at a rate of 78 beats per minute, appears to be underlying  sinus rhythm.  No acute ST-T changes.   LABORATORY DATA:  Hemoglobin 12.4, hematocrit 36.2, WBC 5.1, platelets  139.  Sodium 140, potassium 4.0, chloride 106, CO2 of 25, BUN 18,  creatinine 0.9, glucose 95.  CK-MB less than 1.0, troponin I less than  0.5.   ASSESSMENT:  1. Chest pain, somewhat atypical with reproducible component around      the pacer site, which itself was good.  Plan to admit and rule out.      She still has mild chest discomfort, so I had put in nitro paste.      Check a D-dimer.  If enzymes negative, consider discharge in the      a.m. with out Myoview.  She did have a negative Myoview in May      2009.  If enzymes positive, consider cath.  2. Hypertension.  Blood pressure was reportedly elevated at home, but      it is okay now.  Continue home      medication.  3. Gastroesophageal reflux disease.  Continue PPI.  GI cocktail, but      not helping in the ED.  4. Hypothyroidism/goiter.  She is followed by Dr. Debby Bud with multiple      adjustments to her Synthroid doses here.      [name], ANP  Bruce Elvera Lennox  Juanda Chance, MD, [facility]  Electronically Signed    CB/MEDQ  D:  11/11/2008  T:  11/12/2008  Job:  [id number]

## 2010-11-16 NOTE — Assessment & Plan Note (Signed)
[facility]                           ELECTROPHYSIOLOGY OFFICE NOTE   NAME:[name]                      MRN:          [id number]  DATE:04/02/2006                            DOB:          [birth year]    [name] comes in status post pacemaker implantation for intermittent  complete heart block.  She has had no recurrent lightheaded spells.  She is  doing terrific from that point of view.   When I saw her last April, we were concerned about her systolic blood  pressure which was in the 150 range.  She has recorded it a bunch of time in  the interval and it remains in the 150-160 range.  There has been no  adjustments in her medications.   PHYSICAL EXAMINATION:  VITAL SIGNS:  Blood pressure 150/76, pulse 85.  LUNGS:  Clear.  HEART:  Heart sounds regular.  Device pocket was well-healed.   Interrogation of her Medtronic Kappa one-pulse generator demonstrates a P  wave of 2.8 with impendence of 563, threshold of 0.5 and 0.5.  There was no  intrinsic ventricular rhythm.  Impedence was 663 and threshold was 0.75 and  0.5.  Heart rate excursion was adequate.  Battery voltage was estimated at  5.5 years.  Date of implant was 3 years ago.   IMPRESSION:  1. Complete heart block.  2. Status post pacemaker as above.  3. Hypertension.  4. Treated hyperthyroidism.   PLAN:  [name] is doing well.  We will begin on her Care Link monitor.  She will follow up with Dr. Riley Kill.   I have taken the liberty of initiating lisinopril HCT at 20/12.5 and will  give her a second prescription for lisinopril 40 to take in the event that  the 20/12.5 is either in adequate or associated with overzealous urination.   Will need to get a BMET checked in about 2-3 weeks' time.  Her pacemaker  followup will be remotely.          ______________________________  Duke Salvia, MD, [facility]   SCK/MedQ  DD:  04/02/2006  DT:  04/03/2006  Job #:  [id number]  cc:    Daisy Gess. Norins, MD

## 2010-11-16 NOTE — Op Note (Signed)
[name], [name]                         ACCOUNT NO.:  [id number]   MEDICAL RECORD NO.:  [id number]                   PATIENT TYPE:  INP   LOCATION:  2928                                 FACILITY:  [facility]   PHYSICIAN:  Duke Salvia, M.D.               DATE OF BIRTH:  04-06-1941   DATE OF PROCEDURE:  05/11/2003  DATE OF DISCHARGE:                                 OPERATIVE REPORT   PREOPERATIVE DIAGNOSIS:  Complete heart block.   POSTOPERATIVE DIAGNOSIS:  Complete heart block.   OPERATION PERFORMED:  Dual chamber pacemaker implantation and contrast  venography.   DESCRIPTION OF PROCEDURE:  Following the obtaining of informed consent, the  patient was brought to the electrophysiology laboratory and placed on the  fluoroscopic table in supine position.  After routine prep and drape of the  left upper chest, lidocaine was infiltrated in the prepectoral subclavicular  region.  An incision was made and carried down to the layer of the  prepectoral fascia using electrocautery and sharp dissection.  Hemostasis  was obtained.  Thereafter attention was turned to gaining access to the  extrathoracic left subclavian vein which was facilitated by contrast  venography.  The vein was far underneath the overlying clavicle.  This  facilitated it immensely.  Venipuncture was accomplished without aspiration  of air or puncture of the artery.  Two guidewires were placed and retained  and 0 silk suture was placed in a figure-of-eight fashion and allowed to  hang loosely.   Subsequently 7 French tear-away sheaths were placed through which were  passed a St. Jude 1646T 52 cm passive fixation ventricular lead, serial  number [id number] and a 1642 46 cm passive fixation atrial lead, serial number  [id number].  Under fluoroscopic guidance, these were manipulated to the right  ventricular apex and right atrial appendage respectively with a bipolar R  wave of 19.1mV with a pacing impedance of 587 ohms, a  pacing threshold of  0.4V at 0.8ms with a current at threshold of 0.37mA.  There was no  diaphragmatic pacing at 10V.   The bipolar P wave was 3.83mV with a pacing impedance of 586 ohms, pacing  threshold of 0.5V and 0.78ms.  There was again no diaphragmatic pacing at  10V.  With these acceptable parameters recorded, the leads were secured to  the prepectoral fascia.  The leads were then attached to a Medtronic Kappa  [id number] pulse generator, serial number [id number].  Ventricular pacing and  then P synchronous pacing were identified.  The initial sinus rate was about  105.  At the time of this dictation, the sinus rate is about 85.  The pocket  was copiously irrigated with antibiotic containing saline solution.  The  leads and the pulse generator were placed in the pocket, secured to the  prepectoral fascia.  The wound was closed in three layers in normal fashion.  The wound was washed,  dried and a benzoin and Steri-Strip dressing was  applied.  The sponge, needle and instrument counts were correct at the end  of the procedure according to the staff.  The patient's device was  programmed in DDD mode at 60/130.   COMPLICATIONS:  The patient developed hypopnea in the setting of Versed.  She received Romazicon to address this.  In addition, she complained of  midsternal chest pain that was different from her chronic reflux pain.  Repeated  fluoroscopy of the cardiac silhouette failed to identify loss of contractile  function.  Stat echo at the end of the procedure demonstrated no evidence of  a pericardial effusion.  We raised the patient upon her bed and within about  three or four minutes, the pain that she had was much more like her chronic  gastroesophageal reflux pain.                                               Duke Salvia, M.D.    SCK/MEDQ  D:  05/11/2003  T:  05/12/2003  Job:  [id number]   cc:   Rosalyn Gess. Norins, M.D. [facility]

## 2010-11-16 NOTE — Assessment & Plan Note (Signed)
[facility]                           PRIMARY CARE OFFICE NOTE   NAME:YOUNGLakara, Weiland                      MRN:          [id number]  DATE:10/04/2006                            DOB:          28-Sep-1940    Ms. Baller was just seen yesterday for a full physical exam.  She reports  that approximately 2 hours after office visit, she had the sudden onset  of severe pain at the mid back region.  There is no radiation of pain.  There is no paresthesia.  No weakness.  She describes the pain as  burning and stabbing.  This is a new location and different type of pain  than she has had from her chronic low back pain.  She reports the pain  is constant with no waxing or waning.  She has tried taking Vicodin with  no relief.  She is able to ambulate, but she is very uncomfortable.   PHYSICAL EXAM:  Temperature is 96.8, blood pressure 167/82.  GENERAL APPEARANCE:  A well-nourished woman who is uncomfortable, but in  no acute distress.  BACK:  The patient is able to stand without assistance.  She can step up  to the exam table without assistance.  She had 2+ DTRs to the patellar  tendons.  She can do a straight leg maneuver without pain or discomfort.  The patient did have tenderness to palpation at about the T12-L1 level  with some increased discomfort to the left.   IMPRESSION AND PLAN:  Probable compression fracture to T12 on L1 causing  severe pain.   PLAN:  The patient is sent to [facility] for plain lumbar  spine films to be compared to a study of 2005.  if positive for  compression fracture, she will move directly to a CT scan.  If the  patient has a positive correlating CT scan, I will refer her for  outpatient interventional radiology for kyphoplasty or vertebroplasty.  For pain control, she is prescribed Toradol 10 mg t.i.d. for 5 days, MS-  Contin 15 mg q.12h, Tylenol 1000 mg t.i.d.  She is given Toradol 30 mg  IM in the office prior to her  going to the hospital for x-rays.     Rosalyn Gess Norins, MD  Electronically Signed    MEN/MedQ  DD: 10/04/2006  DT: 10/04/2006  Job #: [id number]

## 2010-11-16 NOTE — H&P (Signed)
NAME:  [name], [name]                         ACCOUNT NO.:  [id number]   MEDICAL RECORD NO.:  [id number]                   PATIENT TYPE:  INP   LOCATION:  1824                                 FACILITY:  [facility]   PHYSICIAN:  Arturo Morton. Riley Kill, M.D.             DATE OF BIRTH:  October 12, 1940   DATE OF ADMISSION:  05/10/2003  DATE OF DISCHARGE:                                HISTORY & PHYSICAL   CHIEF COMPLAINT:  Indigestion and shortness of breath.   HISTORY OF PRESENT ILLNESS:  [name] is a delightful 70 year old female  who is retired from the [employer].  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  She presents with a one week history of shortness of breath associated with  a new onset of indigestion.  Importantly, she has had reflux symptoms in the  past and this is similar to what she has had.  Nonetheless, this started  about a week ago and has been worse with exertion, as has the shortness of  breath.  She has also had some burping and belching along with this.  She  saw Dr. Illene Regulus in the office today and was noted to have a heart  rate of 40, and was subsequently brought to the emergency room.  Incidental  history also includes the fact that she has had onset of phlebitis in the  lower extremities which is new.   PAST MEDICAL HISTORY:  1. Exploratory laparotomy in 1996, for possible malignancy with no cancer     found.  2. Laparotomy and treatment for ruptured diverticulum in October 2003, by     Dr. Magnus Ivan.  3. Colostomy takedown in January 2004.  4. Prior history of measles and chicken pox.  5. History of thyroid nodule and exogenous hormone.  6. Depression.  7. History of osteoporosis, which according to Dr. Debby Bud by phone is     severe.  8. History of hypertension.  9. History of gastroesophageal reflux.   MEDICATIONS:  1. Hydrochlorothiazide 25 mg daily.  2. Synthroid 112 mg daily.  3. Forteo 20 mcg subcu.   FAMILY HISTORY:  The mother died at age 41 of  natural causes after declining  after back and leg injuries.  Father died at age 23 of congestive heart  failure.  One sister and two brothers who are in good health.   SOCIAL HISTORY:  The patient is a Engineer, structural.  She works at the [employer] and is retired now.  She was  married for 22 years and has two children.   The patient has not had any significant weight loss.  She has had some  diarrhea the past few days.   PHYSICAL EXAMINATION:  GENERAL:  She is an alert and oriented female in no  acute distress.  VITAL SIGNS:  Temperature 96.6, pulse of 41, respiratory rate 22, and blood  pressure was  191/69, the SA2 was 98% on room air.  She is a female in no  acute distress.  HEENT:  Unremarkable.  LUNGS:  Clear to auscultation and percussion.  There is a systolic ejection  murmur that is 2/6, noted at the left sternal edge and also at the apex.  ABDOMEN:  Soft.  GENITOURINARY:  Not done.  EXTREMITIES:  Reveal evidence of some superficial phlebitis with cords both  in the medial aspect of the left thigh and actually the left lower extremity  superficially.   LABORATORY DATA:  The patient's chest x-ray does not reveal an enlarged  heart.  There is a left basilar density which recommends followup.  There is  also evidence of a goiter.  EKG reveals a junctional escape rhythm with  fairly prominent P-waves.   Hemoglobin of 12.4, hematocrit 37.2, platelets 151,000, white count 7400.  INR is 1.2.  Sodium is 140, potassium 3.9, chloride 110, CO2 22, glucose  102, BUN 15, creatinine 0.8, total protein 6.9, albumin 4, the SGPT is  elevated, bilirubin 0.6.  CK total is 71, BNP is 777.   IMPRESSION:  1. New onset junctional escape rhythm, question etiology, question AV nodal     block, rule out right coronary artery occlusion.  2. Gastroesophageal reflux, question if this could be due to right coronary     artery syndrome or due to true reflux as the  patient has noted in the     past.  3. History of goiter, chronic, with suppression therapy.  4. New onset superficial phlebitis.   PLAN:  1. Admit to CCU.  2. Intravenous heparin sulfate.  3. A 2-D echocardiogram and venous Dopplers in the a.m.  4. Catheterization likely with possible pacemaker.  5. Dr. Debby Bud to see and help address superficial phlebitis.  6. Check D-dimer as pulmonary embolus could be a possible consideration     given the patient's phlebitis presentation.  This has been discussed with     the patient and her family in detail, and we will proceed as planned.                                                Arturo Morton. Riley Kill, M.D.    TDS/MEDQ  D:  05/10/2003  T:  05/10/2003  Job:  [id number]

## 2010-11-16 NOTE — Discharge Summary (Signed)
[name], [name]                         ACCOUNT NO.:  [id number]   MEDICAL RECORD NO.:  [id number]                   PATIENT TYPE:  INP   LOCATION:  0483                                 FACILITY:  [facility]   PHYSICIAN:  Abigail Miyamoto, M.D.              DATE OF BIRTH:  08-21-1940   DATE OF ADMISSION:  07/22/2002  DATE OF DISCHARGE:  07/31/2002                                 DISCHARGE SUMMARY   DISCHARGE DIAGNOSES:  1. Colostomy, status post colostomy takedown and colon reanastomosis.  2. Wound infection.   HISTORY OF PRESENT ILLNESS:  The patient is a 70 year old female who had  undergone exploratory laparotomy with colostomy for perforated septum with  diverticulitis in October 2003.  She now presents for elective colostomy  takedown.   HOSPITAL COURSE:  The patient was admitted and taken to the operating room.  She underwent colostomy takedown with mobilization of the splenic flexure  and colon reanastomosis.  She tolerated the procedure well and taken in  stable condition to a regular surgical floor.  On postoperative day #1 her  hemoglobin was 11.6, white count of 11.8.  Potassium was 4.3.  She was kept  n.p.o. until her ileus resolved.  On postoperative day #2 she was started on  some sips of liquids which she tolerated with some mild nausea.  By  postoperative day #4 she was still having no flatus and still having some  mild nausea.  She began developing erythema around her incision.  Her  abdomen remained mildly distended.  At this point she was started on Reglan  and kept on IV fluids and liquids.  By postoperative day #5 her IV  antibiotics were changed, but her erythema persisted so the wound was opened  up, and only some old blood was drained from the wound.  Wet-to-dry dressing  changes were started.  Over the next several days her bowels began opening  up.  By postoperative day #8 she was having normal bowel movements, was  tolerating a regular diet.  She  Reglan was stopped as well as her IV fluids,  and she was changed to oral antibiotics.  By postoperative day #9 she was  doing quite well.  Her wound was clean.  She was given wound care  instructions and at this point was discharged home.   DIET:  Regular.   ACTIVITY:  She is to do no heavy lifting.   WOUND CARE:  She will do wet-to-dry dressing changes twice a day.   FOLLOW-UP:  She will follow up in my office in one week.                                               Abigail Miyamoto, M.D.    DB/MEDQ  D:  08/11/2002  T:  08/11/2002  Job:  [id number]

## 2010-11-16 NOTE — H&P (Signed)
NAME:  QUINCEY, NORED                         ACCOUNT NO.:  [id number]   MEDICAL RECORD NO.:  [id number]                   PATIENT TYPE:  EMS   LOCATION:  MAJO                                 FACILITY:  [facility]   PHYSICIAN:  Willa Rough, M.D.                  DATE OF BIRTH:  24-Aug-1940   DATE OF ADMISSION:  05/16/2003  DATE OF DISCHARGE:                                HISTORY & PHYSICAL   HISTORY OF PRESENT ILLNESS:  This 70 year old female recently was at [facility] and underwent a Medtronic pacemaker by Dr. Duke Salvia.  CT of the chest showed no pulmonary embolus.  She did have question  as to some superficial phlebitis in her leg.  There was also question of a  small nodule and there was plan for follow up of this in three months.  GI  was seen by Dr. Molly Maduro Buccini who recommended Protonix.  The plan was for  an outpatient Cardiolite.  Echocardiogram in the hospital showed mild to  moderate MR with normal LV function.   This evening, the patient called saying that she was having some discomfort  in her chest with a burning sensation.  There was some mild diaphoresis.  She was brought to the emergency room.  Nitroglycerin has not had any effect  and she appears stable at this time.   ALLERGIES:  CODEINE where she gets significant nausea.   MEDICATIONS:  1. Nexium 40 mg b.i.d.  2. Synthroid 0.112 mcg q.d.  3. Hydrochlorothiazide 25 mg.  4. Aspirin 325 mg.  5. A medication for osteoporosis.   PAST MEDICAL HISTORY:  For other medical problems, see the complete list  below.   SOCIAL HISTORY:  The patient is married with two children and living in [place], [place].  She quit smoking many years ago.  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   FAMILY HISTORY:  There is no strong family history of coronary disease at a  [place] age.   REVIEW OF SYMPTOMS:  The patient did have some diaphoresis with the episode  tonight.  She  has had no major HEENT problems.  She has a rash on her  sternum that actually occurred before she came into the hospital last time.  Her pacemaker site appears to be nicely healed.  She has had some shortness  of breath and chest discomfort as described.  She has had no GU symptoms.  There is a history of some depression.  She has had a history of  osteoporosis.  There is reflux as mentioned.  Otherwise, the remainder of  her review of systems is negative.   PHYSICAL EXAMINATION:  VITAL SIGNS:  Temperature is 98.4, pulse is 78, blood  pressure currently is 150/75.  LUNGS:  Clear.  NECK:  Normal.  HEENT:  Reveals no marked abnormalities.  CHEST:  Her anterior chest reveals a mild rash in the center.  Her left  upper chest has her pacemaker that is nicely healing.  CARDIOVASCULAR:  S1 with a S2 but no clicks or significant murmurs.  LUNGS:  Clear.  ABDOMEN:  Benign.  GENITOURINARY:  Deferred.  RECTAL:  Deferred.  EXTREMITIES:  She has no significant peripheral edema.  NEUROLOGICAL:  Grossly intact.   LABORATORY DATA:  Her EKG is paced.  Chest x-ray is pending at this time and  will be reviewed to be sure that there is no significant abnormality.   PROBLEM LIST:  1. Problems include extreme nausea from codeine.  2. Hypothyroidism on medication.  3. History of some depression.  4. Osteoporosis.  5. Hypertension.  6. Gastroesophageal reflux disease.  7. Thyroid nodule.  8. Question of a nodule on chest x-ray and CT with plans for follow up in     three months.  9. Medtronics pacemaker placed on May 13, 2003 for complete heart     block.  This appears stable.  10.      Recent superficial phlebitis which is improved.  11.      History of remote tobacco.  12.      Normal left ventricular function by echocardiogram recently.  13.      Mild to moderate mitral regurgitation by echocardiogram recently.  14.      History of multiple surgeries in the past.  15.      Chest  discomfort tonight.  The exact etiology is not clear.  It     does not appear to be an acute coronary event.  I do not hear any rubs.     The patient will be admitted and we will proceed with a Cardiolite scan.     Dr. Arturo Morton. Riley Kill will be notified and we will decide if any further     evaluation will be necessary.                                                Willa Rough, M.D.    Cleotis Lema  D:  05/16/2003  T:  05/16/2003  Job:  [id number]   cc:   Rosalyn Gess. Norins, M.D. [facility]   Maisie Fus D. Riley Kill, M.D.

## 2010-11-16 NOTE — Op Note (Signed)
[name], [name]                           ACCOUNT NO.:  [id number]   MEDICAL RECORD NO.:  [id number]                   PATIENT TYPE:  INP   LOCATION:  0451                                 FACILITY:  [facility]   PHYSICIAN:  Abigail Miyamoto, MD                DATE OF BIRTH:  June 23, 1941   DATE OF PROCEDURE:  04/28/2002  DATE OF DISCHARGE:                                 OPERATIVE REPORT   PREOPERATIVE DIAGNOSES:  Perforated viscus.   POSTOPERATIVE DIAGNOSES:  Perforated sigmoid diverticulitis.   OPERATION PERFORMED:  1. Exploratory laparotomy.  2. Sigmoid colectomy and colostomy.   SURGEON:  Douglas A. Magnus Ivan, M.D.   ASSISTANT:  Sheppard Plumber. Earlene Plater, M.D.   ANESTHESIA:  General endotracheal.   FINDINGS:  The patient was found to have perforated sigmoid diverticulitis  with minimal gross contamination.   DESCRIPTION OF PROCEDURE:  The patient was brought to the operating room and  identified.  She was placed supine on the operating table and general  anesthesia was induced.  Her abdomen was then prepped and draped in the  usual sterile fashion.  Using a 10 blade, a midline incision was then  created.  Incision was then carried down through the fascia with  electrocautery.  The perineum was then opened the entire length of the  abdomen.  Exploration of the abdomen was then performed.  The stomach  appeared normal.  The small bowel was then eviscerated and run from the  ligament of Treitz to the terminal ileum and also appeared normal.  The  cecum and transverse colon were normal as well.  Examination down in the  pelvis revealed colon stuck at the level of the uterus and this was easily  mobilized out of the pelvis and the area of perforation with sigmoid  diverticulitis was easily identified.  The sigmoid colon was then mobilized  along the white line of Toldt.  An area of colon just proximal to the  perforated diverticulitis was identified and transected GIA 75 stapler.   The  mesentery was then taken down working toward the pelvis using Kelly clamps  and 2-0 silk ties.  An area of rectum was then identified and likewise  transected with a GIA 75 stapler.  The entire sigmoid colon specimen was  then removed from the field.  The rest of the mesentery was again controlled  with 2-0 silk ties and suture ligatures.  The abdomen was then irrigated  with normal saline.  Hemostasis appeared to be achieved.  The proximal end  of the ascending colon was then mobilized further along the white  line of  Toldt.  An adequate length was then created for the colostomy.  Next,  separate skin incision was made in the patient's left flank.  The incision  was then carried down to the fascia which was opened in the cruciate fashion  with the electrocautery.  The muscles  were then split bluntly and the  underlying peritoneum was opened.  The descending colon was pulled up out of  this site as the ostomy.  The colon was then sutured to the inside of the  abdominal wall with several 2-0 silk sutures.  Two separate 3-0 Prolene  sutures were then placed at the rectum along the suture line for  identification at a later date.  The abdomen was again irrigated with  copious amounts of normal saline.  The midline fascia was then closed with a  running #1 PDS suture.  The skin was then irrigated and closed with skin  staples.  Next the staple line of the ostomy was opened with the  electrocautery.  The ostomy was then matured circumferentially with  interrupted 3-0 Vicryl sutures.  The ostomy appeared pink and viable at the  end of the procedure.  An ostomy appliance was then applied.  All sponge,  needle and instrument counts were correct at the end of the procedure.  The  patient was then extubated in the operating room and taken in stable  condition to the recovery room.                                                  Abigail Miyamoto, MD    DB/MEDQ  D:  04/28/2002  T:   04/28/2002  Job:  [id number]

## 2010-11-16 NOTE — Discharge Summary (Signed)
NAME:  [name], [name]                         ACCOUNT NO.:  [id number]   MEDICAL RECORD NO.:  [id number]                   PATIENT TYPE:  INP   LOCATION:  4743                                 FACILITY:  [facility]   PHYSICIAN:  Arturo Morton. Riley Kill, M.D.             DATE OF BIRTH:  January 15, 1941   DATE OF ADMISSION:  05/10/2003  DATE OF DISCHARGE:  05/13/2003                                 DISCHARGE SUMMARY   PROCEDURES:  1. Medtronic dual chamber pacemaker implantation on May 11, 2003.  2. Lower extremity venous Dopplers.   REASON FOR ADMISSION:  Please refer to dictated admission note.   LABORATORY DATA:  Normal CBC.  Normal cardiac enzymes.  BNP 777.  Normal  electrolytes and renal function.  Mildly elevated AST of 47, ALT 89.  D-  dimer 2.52.  TSH 0.94.  ACE inhibitor level pending.  Lipid profile:  Total  cholesterol 128, HDL 55, HDL 33, LDL 84 (cholesterol to HDL ratio 3.9).   Chest CT scan:  Negative for pulmonary emboli;  8 mm right middle lobe  nodule;  large left thyroid goiter.   HOSPITAL COURSE:  Following presentation to the emergency room, the patient  was found to be in complete heart block.  She presented with complaints of  chest pain as well, and serial cardiac markers were negative for myocardial  ischemia.   The patient also noted to have elevated D-dimer (2.5), and concern for  phlebitis.  Lower extremity venous duplex scan was negative for DVT, but  noted thrombosis of superficial varicosities from the mid calf to the mid  inner thigh.  This was treated with warm compresses.   A followup CT scan of the chest, however, was negative for pulmonary emboli.  There was, however, evidence of an 8 mm RML nodule- the patient will need a  followup non-contrast chest CT in three months.   A 2-D echocardiogram revealed normal left ventricular function with  mild/moderate mitral regurgitation.   The patient was then referred to Dr. Sherryl Manges for evaluation of  arrhythmia, concluded as complete heart block.  Recommendation was to  proceed with permanent pacemaker implantation.  This was performed on  May 11, 2003, with no complications, and postoperative CXR revealing no  evidence of pneumothorax.   Of note, Dr. Graciela Husbands also recommended outpatient stress imaging for further  evaluation of chest pain.   The patient was also referred to Dr. Bernette Redbird for reassessment of  GERD.  Proton pump inhibitor was increased to b.i.d. dosing, and Sucralfate  was discontinued.  No further workup was recommended at this time.   The patient was cleared for discharge on hospital day #2.  Previous home  medications were resumed, with the noted increase of the proton pump  inhibitor, Nexium to b.i.d. dosing.   DISCHARGE DIAGNOSES:  1. Complete heart block.     a. Status post Medtronic dual chamber pacemaker  implantation, May 11, 2003.  2. Chest pain.     a. Negative cardiac markers.     b. Normal left ventricular function.  3. Superficial thrombophlebitis.     a. No bilateral deep vein thrombosis.  4. Elevated D-dimer.     a. Negative chest CT for pulmonary embolus.  5. A right middle lobe nodule (8 mm).     a. Followup non-contrast chest CT in three months.  6. Treated hypothyroidism.     a. Normal TSH;  large left thyroid goiter.  7. Hypertension.  8. Gastroesophageal reflux disease.  9. Mild/moderate mitral regurgitation.  10.      Mildly elevated liver enzymes.  11.      History of methicillin-resistant Staphylococcus aureus in January     2004.   DISCHARGE MEDICATIONS:  1. Nexium 40 mg b.i.d. (increase).  2. Synthroid 0.112 mg q.d.  3. Hydrochlorothiazide 25 mg q.d.  4. Coated aspirin 325 mg q.d.   DISCHARGE INSTRUCTIONS:  The patient is to follow pacemaker discharge  instructions, as outlined.  No driving for two weeks.   FOLLOWUP:  1. The patient is scheduled to follow up with Dr. Shawnie Pons on Friday,      May 20, 2003, at 2 p.m.  She will then be referred for a followup     stress Cardiolite.  2. The patient is scheduled to follow up with the [facility] on     May 30, 2003, at 9:45 a.m.  The patient will then return for a     followup with Dr. Sherryl Manges on August 10, 2003, at 9 a.m.  3. The patient was also instructed to follow up with Dr. Debby Bud, as     previously scheduled.      Gene Serpe, P.A. [facility]                      Thomas D. Riley Kill, M.D.    GS/MEDQ  D:  05/13/2003  T:  05/13/2003  Job:  [id number]   cc:   Rosalyn Gess. Norins, M.D. [facility]   Bernette Redbird, M.D.  [address]  [place], [place] [address]  Fax: [phone]

## 2010-11-16 NOTE — Discharge Summary (Signed)
[name], [name]               ACCOUNT NO.:  [id number]   MEDICAL RECORD NO.:  [id number]          PATIENT TYPE:  INP   LOCATION:  3742                         FACILITY:  [facility]   PHYSICIAN:  Verne Grain, MD   DATE OF BIRTH:  [birth year]   DATE OF ADMISSION:  08/27/2004  DATE OF DISCHARGE:  08/29/2004                                 DISCHARGE SUMMARY   DISCHARGE DIAGNOSES:  1.  Chest pain, unclear etiology.  Negative cardiac enzymes.  EKG benign.      Negative adenosine Cardiolite.  Ejection fraction 83%.  Negative for      ischemia.  2.  Productive cough.  Complaining of nasal discharge.  Nasal culture      pending.  Fever prior to admission with a temperature of 102 degrees      approximately three and a half weeks ago.  Blood cultures this admission      negative x2.  Urinalysis negative.  Urine culture pending.  ESR 3.  3.  Weight loss, 16 pounds unintentional over the last five months.   PAST MEDICAL HISTORY:  1.  Echocardiogram with a normal ejection fraction.  2.  History of complete heart block, bradycardia status post Medtronic      Pacemaker November 2004.  3.  History of incidentally discovered right middle lobe nodule on CT scan      March 2005 with repeat scan December 2005 revealing no change in the      sizeable lesion.  4.  History of superficial thrombophlebitis.  5.  History of small hiatal hernia.  6.  History of GERD.  7.  History of remote tobacco use; quit 30 years ago.  8.  Hypertension.  9.  Depression.  10. Osteoporosis.  11. Hernia repair.  12. Hypothyroidism.   PROCEDURES:  1.  2-D echocardiogram February 27 showing an ejection fraction of 55-65%,      left ventricular wall thickness normal, no pericardial effusion, left      ventricular size normal.  2.  Adenosine Cardiolite on August 29, 2004 results as stated above.   RADIOLOGY:  1.  Sinuses:  No plain film evidence for paranasal sinus disease.  2.  Chest x-ray:  Negative chest for  active disease.  No active inflammatory      changes noted.   DISCHARGING PRESCRIPTIONS:  1.  Same as prior to admission including aspirin.  2.  Vasotec.  3.  Synthroid.  4.  Nexium.  5.  Patient will try Claritin over-the-counter.   FOLLOW-UP:  She was to follow up with Dr. Debby Bud.  Patient was scheduled  appointment with Dr. Debby Bud outpatient next week.  Possibly consider CT of  the chest, abdomen, pelvis secondary to unexplained weight loss.  Also,  history of thyroid multinodular goiter, last scan in 2004.  May need  possible follow-up.   HOSPITAL COURSE:  Patient admitted through the emergency department on the  27th about 4 a.m. complaining of chest pain and cough, upper respiratory  symptoms with dyspnea on exertion x3-1/2 weeks with intermittent nausea and  belching, weight loss as stated  above.  Patient's initial enzymes negative.  EKG paced without significant changes.  Chest x-ray revealing no acute  changes.  Patient admitted.  Cardiac enzymes cycled.  Baseline blood work  obtained including TSH, sedimentation rate, B12, urinalysis.  The patient  placed on medications from home.  PA and lateral chest x-ray obtained.  The  patient experienced no further chest pain.  Adenosine Cardiolite results as  stated above.  Patient being discharged home with follow-up with Dr. Debby Bud.      MB/MEDQ  D:  08/29/2004  T:  08/30/2004  Job:  [id number]

## 2010-11-16 NOTE — Op Note (Signed)
   NAME:  [name], [name]                         ACCOUNT NO.:  [id number]   MEDICAL RECORD NO.:  [id number]                   PATIENT TYPE:  AMB   LOCATION:  DAY                                  FACILITY:  [facility]   PHYSICIAN:  Abigail Miyamoto, M.D.              DATE OF BIRTH:  01-03-1941   DATE OF PROCEDURE:  DATE OF DISCHARGE:                                 OPERATIVE REPORT   PREOPERATIVE DIAGNOSIS:  Chronic stitch abscess.   POSTOPERATIVE DIAGNOSIS:  Chronic stitch abscess.   OPERATION/PROCEDURE:  Excision of chronic stitch abscess.   SURGEON:  Abigail Miyamoto, M.D.   ANESTHESIA:  LMA and 0.25% Marcaine.   ESTIMATED BLOOD LOSS:  Minimal.   DESCRIPTION OF PROCEDURE:  The patient was brought to the operating room,  identified as [name].  She was placed supine on the operating table.  Anesthesia was induced.  Her abdomen was then prepped and draped in the  usual sterile fashion.  The skin around the chronic abscess site in the  midline just below the umbilicus, was anesthetized with 0.25% Marcaine.  An  elliptical incision was made around the chronic opening with the #15 blade.  This incision was carried down to the fascia circumferentially with the  electrocautery.  A large wedge of skin containing the chronic draining  tissue down the fascia was then excised.  Underlying Prolene suture was  identified and excised as well.  The rest of the chronic granulation tissue  was excised and cauterized with the electrocautery.  The fascia was then  again anesthetized with the 0.25% Marcaine.  The wound was then thoroughly  irrigated with normal saline.  The skin was then closed again with skin  staples.  The patient tolerated the procedure well.  All sponge, needle and  instrument counts were correct at the end of the procedure.  The patient  taken in stable condition from the operating room to the recovery room.                                               Abigail Miyamoto,  M.D.    DB/MEDQ  D:  03/10/2003  T:  03/10/2003  Job:  [id number]

## 2010-11-16 NOTE — Op Note (Signed)
NAME:  [name], [name]                         ACCOUNT NO.:  [id number]   MEDICAL RECORD NO.:  [id number]                   PATIENT TYPE:  INP   LOCATION:  K270                                 FACILITY:  [facility]   PHYSICIAN:  Abigail Miyamoto, M.D.              DATE OF BIRTH:  1941-04-28   DATE OF PROCEDURE:  07/22/2002  DATE OF DISCHARGE:                                 OPERATIVE REPORT   PREOPERATIVE DIAGNOSIS:  Colostomy.   POSTOPERATIVE DIAGNOSIS:  Colostomy.   PROCEDURE:  1. Colostomy takedown and colon reanastomosis.  2. Mobilization of the splenic flexure.   SURGEON:  Abigail Miyamoto, M.D.   ASSISTANT:  Anselm Pancoast. Zachery Dakins, M.D.   ANESTHESIA:  General endotracheal.   ESTIMATED BLOOD LOSS:  Minimal.   INDICATIONS FOR PROCEDURE:  [name] is a 70 year old female, who had  presented with a perforated sigmoid diverticulitis in October 2003.  She  underwent emergent exploratory laparotomy and colostomy with resection.  She  now presents for a colostomy takedown.   PROCEDURE IN DETAIL:  The patient was brought to the operating room and  identified as [name].  She was placed supine on the operating room  table, and anesthesia was induced.  Next, the patient was placed in  lithotomy position.  The patient's ostomy was then closed with a running 2-0  silk suture.  The patient's abdomen was then prepped and draped in the usual  sterile fashion.  Using a #10 blade, the patient's previous midline incision  was opened, removing the scar.  The incision was carried down through the  fascia with the electrocautery.  Upon entering the abdomen, the patient was  found to have a small amount of omentum stuck to the abdominal wall which  was taken down both with the cautery and the Metzenbaum scissors.  Further  exploration revealed that the rectal stump was identified with the  previously placed Prolene sutures.  Several adhesions to this were taken  down with the  electrocautery.  The stump was then mobilized further, taking  down several lateral attachments with Kelly clamps and 2-0 silk ties.  Next,  our attention turned toward the ostomy.  The ostomy at the skin level was  dissected out elliptically with the #10 scalpel.  This incision was carried  down into the soft tissue into the fascia with the electrocautery  circumferentially, dissecting out the ostomy.  The ostomy was then involuted  back into the abdominal cavity, and the rest of the adhesions were excised  with the cautery.  The proximal colon was then mobilized further along the  white line of Toldt, and the splenic flexure was dissected free with the  electrocautery, mobilizing the splenic flexure.  The spleen appeared intact  without evidence of injury.  After mobilization of the splenic flexure, the  proximal colon easily fit down into the pelvis with the distal rectum.  At  this  point, the proximal colon was opened along its old suture line,  excising back approximately 2-3 cm of the colon, and the mesentery was taken  down with clamps and ties.  Two 2-0 silk stay sutures were placed on each  side of the opening of colon.  Likewise, the rectal stump was mobilized  further, and the mesentery at the distal end of the stump seen to be  proximal into the stump was taken down with Kelly clamps and silk ties.  Again, this suture line was opened, and the bowel and approximately 1 cm of  the stump was excised.  Both opened ends appeared quite viable.  The silk  stay sutures were likewise placed in the corners of the rectal stump.  An  end-to-end anastomosis was then performed with interrupted 3-0 silk sutures.  This again was done in a circumferential manner, and a large anastomosis  appeared to be created.  A piece of redundant mesentery on the colon was  used to help cover the anastomosis.  A healthy, viable anastomosis without  tension appeared to be created.  The abdomen was then  copiously irrigated  with normal saline.  Hemostasis appeared to be achieved.  The posterior  fascia of the ostomy site was then closed with interrupted #1 Novofil pop  off sutures.  The anterior fascia was then also closed with interrupted  Novofil pop off sutures as well.  The midline was then closed with a running  #1 Novofil suture.  The skin was then irrigated, and both the midline and  the ostomy were closed with skin staples.  Gauze and tape were then applied.  The patient tolerated the procedure well.  All sponge, needle, and  instrument counts were correct at the end of the procedure.  The patient was  then extubated in the operating room and taken in stable condition to the  recovery room.                                               Abigail Miyamoto, M.D.    DB/MEDQ  D:  07/22/2002  T:  07/22/2002  Job:  [id number]

## 2010-11-16 NOTE — Op Note (Signed)
NAMECORTNEY, [name]               ACCOUNT NO.:  [id number]   MEDICAL RECORD NO.:  [id number]          PATIENT TYPE:  INP   LOCATION:  0480                         FACILITY:  [facility]   PHYSICIAN:  Abigail Miyamoto, M.D. DATE OF BIRTH:  [birth year]   DATE OF PROCEDURE:  05/04/2004  DATE OF DISCHARGE:                                 OPERATIVE REPORT   PREOPERATIVE DIAGNOSIS:  Ventral incisional hernia.   POSTOPERATIVE DIAGNOSIS:  Ventral incisional hernia.   PROCEDURE:  1.  Laparoscopic ventral hernia with mesh.  2.  Extensive lysis of adhesions.   SURGEON:  Dr. Riley Lam A. Blackman   ANESTHESIA:  General endotracheal anesthesia, 0.25% Marcaine.   ESTIMATED BLOOD LOSS:  Minimal.   INDICATIONS:  [name] is a pleasant 70 year old female, who has had  perforated diverticulitis in the past.  She has also had a colostomy  takedown.  She now presents with a ventral incisional hernia.   FINDINGS:  The patient was found to have extensive adhesions of colon and  small bowel to the abdominal wall and involving hernia.  Extensive lysis of  adhesions taking approximately 1 hour was necessary in order to reduce all  the bowel.   PROCEDURE IN DETAIL:  The patient was brought to the operating room and  identified as [name].  She was placed supine on the operating table,  and general anesthesia was induced.  Her abdomen was then prepped and draped  in the usual sterile fashion.  Using a #15 blade, a small transverse  incision was made in the patient's right flank.  The incision was carried  down to the fascia which was then identified and opened with a scalpel.  The  underlying muscle was then bluntly retracted, and the peritoneum was  identified and opened with the Metzenbaum scissors.  The Tresa Endo could then be  passed into the peritoneal cavity.  A 0 Vicryl pursestring suture was then  placed around the fascial opening.  The Hasson port was placed through the  opening, and  insufflation of the abdomen was begun.  Upon evaluating the  abdomen, the patient was found to have extensive adhesions of omentum and  colon to the midline as well as small bowel.  Then 5 mm ports were placed in  the patient's right and left lower quadrant under direct vision.  Extensive  lysis of adhesions taking over an hour was then performed with the harmonic  scalpel in order to free up all the small bowel and colon that was stuck to  the abdominal wall, especially where the fascia appeared to be splayed out.  Extreme care was taken to avoid injuring any of the small bowel or colon,  and it was evaluated multiple times to make sure no injuries were  identified.  Once all the adhesions were taken down and the small bowel and  colon were freed from the abdominal wall, the defect could be more easily  identified in the upper abdomen.  The falciform ligament had to be taken  down slightly as well.  At this point, a piece of Proceed mesh from  [employer]  was brought onto the field.  A 15 x 20 cm piece of mesh was used.  Four #1  Novofil sutures were then placed in the four corners of the mesh.  The mesh  was then rolled up and placed at the camera port.  It was then unrolled in  the abdomen under direct vision.  Four separate skin incisions were made  with the 11 blade, and the suture passer was used to pull up the Novofil  sutures through the fascia and abdominal wall.  Once this was done, all four  sutures were secured, and the large piece of mesh was pulled up the  abdominal wall, giving excellent coverage of the fascial defect.  The  sutures were then tied in place.  A surgical tacker was then used to tack  the mesh in circumferentially.  Again, excellent coverage of the abdominal  wall and fascial defect appeared to be achieved.  At this point, the bowel  was again examined; hemostasis was felt to be achieved, and no injuries were  felt to be identified.  All ports were removed under  direct vision, and the  abdomen was deflated.  All incisions were then anesthetized with 0.25%  Marcaine after the sutures were cut.  The abdominal incision was then closed  with 4-0 Monocryl subcuticular sutures.  Steri-Strips, gauze, and tape were  then applied.  The patient tolerated the procedure well.  All sponge,  needle, and instrument counts were correct at the end of the procedure.  The  patient was then extubated in the operating room and taken in stable  condition to the recovery room.      DB/MEDQ  D:  05/04/2004  T:  05/04/2004  Job:  [id number]

## 2010-11-16 NOTE — Discharge Summary (Signed)
NAME:  [name], [name]                         ACCOUNT NO.:  [id number]   MEDICAL RECORD NO.:  [id number]                   PATIENT TYPE:  INP   LOCATION:  6529                                 FACILITY:  [facility]   PHYSICIAN:  Arturo Morton. Riley Kill, M.D.             DATE OF BIRTH:  Aug 13, 1940   DATE OF ADMISSION:  05/16/2003  DATE OF DISCHARGE:  05/18/2003                           DISCHARGE SUMMARY - REFERRING   DISCHARGE DIAGNOSES:  1. Chest pain, resolved.  2. 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  3. Superficial thrombophlebitis.  4. Recent CT scan for a pulmonary embolus with nodules noted.  Follow up is     recommended in 3 months.  5. Gastroesophageal reflux disease, followed by Dr. Matthias Hughs.  6. History of remote tobacco abuse, but quit approximately 30 years ago.  7. Hypertension.  8. Osteoporosis.  9. History of depression.  10.      Multiple surgeries.  11.      Hypothyroidism, treated.   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  Graciela Husbands.  A CT of the chest was also performed to rule out PE, and this showed  a small nodule, which was recommended to be followed up in three months.  She was admitted on May 16, 2003, complaining of substernal chest pain,  radiating into her left shoulder.  She was diaphoretic, and was short of  breath.  She was admitted to the hospital, and laboratory studies reveal  essentially normal electrolytes.  BUN 13, creatinine 0.9.  Cardiac enzymes  and troponins were negative.  EKG revealed ventricular paced rhythm.   A 2-D echocardiogram was also performed during this hospitalization, and  this revealed no pericardial effusion.  Cardiolite scan was performed, and  the patient had an EF of 79% with normal wall motion.  There  was no evidence  of ischemia or infarction.  At this point, we felt that the patient was  ready for discharge to home on May 18, 2003 in stable condition.  Plan  an outpatient gallbladder ultrasound.   The patient will be discharged to home on the admission medications which  include the following.   DISCHARGE MEDICATIONS:  1. Nexium 40 mg b.i.d.  2. Synthroid 112 mcg daily.  3. HCTZ 25 mg daily.  4. Aspirin 325 mg daily.  5. An injection for osteoporosis on a daily basis.  6. The patient may take Tylenol 1-2 tablets q.6h. as needed for pain.   ACTIVITY:  As prior to admission.   DIET:  Remain on a low-fat diet.   WOUND CARE:  Clean the pacer site as directed.   DISCHARGE INSTRUCTIONS:  She is not to eat the morning of  her ultrasound.  Her ultrasound is May 20, 2003 at 8 a.m.   FOLLOW UP:  She is to follow up with Dr. Riley Kill on June 06, 2003 at  10:15 a.m.      Guy Franco, P.A. [facility]                      Thomas D. Riley Kill, M.D.    LB/MEDQ  D:  08/30/2003  T:  08/31/2003  Job:  [id number]   cc:   Rosalyn Gess. Norins, M.D. [facility]   Maisie Fus D. Riley Kill, M.D.

## 2010-11-16 NOTE — Op Note (Signed)
NAME:  [name], [name]                         ACCOUNT NO.:  [id number]   MEDICAL RECORD NO.:  [id number]                   PATIENT TYPE:  AMB   LOCATION:  ENDO                                 FACILITY:  [facility]   PHYSICIAN:  Bernette Redbird, M.D.                DATE OF BIRTH:  1940/10/05   DATE OF PROCEDURE:  12/15/2002  DATE OF DISCHARGE:                                 OPERATIVE REPORT   PROCEDURE:  Colonoscopy with biopsies.   INDICATION:  This is a 70 year old female with a prior history of IBS  symptoms, alternating between diarrhea and constipation.  She was actually  scheduled for a colonoscopy last November, partly for the purposes of colon  cancer screening.  However, before that procedure was accomplished, the  patient developed perforated diverticulitis necessitating emergency surgery  by Abigail Miyamoto, M.D. with placement of temporary colostomy which was  taken down about five months ago.   Interestingly, the patient now has rather regular bowel habits and no  further IBS type symptoms.   FINDINGS:  Essentially normal postop exam.  Slight nodularity of  anastomosis.  Minimal residual diverticulosis.   DESCRIPTION OF PROCEDURE:  The nature, purpose, and risks of the procedure  had been discussed with the patient who provided written consent.  Sedation  for this procedure and the upper endoscopy which preceded it totaled  fentanyl 75 mcg and Versed 7.5 mg IV without arrhythmias or desaturation.   The Olympus adult tension pediatric video colonoscope was readily advanced  to the cecum, as identified by visualization of the appendiceal orifice, and  the scope was then advanced into the terminal ileum which had normal mucosal  appearance.  Pullback was then performed.  The quality of the prep was  excellent, so it is felt that all areas were well seen.   This is basically a normal examination.   There was a little bit of nodularity at the colo-colo anastomosis which  was  located about 13 cm from the external anal opening.  This nodularity was  biopsied.   There was also some mild residual diverticular disease in the distal colon.   I did not see any evidence of colitis, polyps, cancer, or vascular ectasia.  Retroflexion was not performed.   Random mucosal biopsies were obtained along the length of the colon to help  rule out microscopic colitis as a source of the patient's previous diarrhea.   The patient tolerated the procedure well, and there were no apparent  complications.   IMPRESSION:  1. Unremarkable postop colonoscopy.  2. Minimal residual diverticulosis, mild nodularity at the anastomosis which     was widely patent and is not an unusual finding.    PLAN:  1. Await pathology results.  2. Consider follow-up colonoscopy or flexible sigmoidoscopy in five years     for ongoing colon cancer screening.  Bernette Redbird, M.D.    RB/MEDQ  D:  12/15/2002  T:  12/15/2002  Job:  [id number]   cc:   Stacie Acres. White, M.D.  [address]  [place]  [place] [address]  Fax: [phone]   Abigail Miyamoto, M.D.  [address]  [place]  [place] [address]  Fax: [phone]

## 2010-11-16 NOTE — Assessment & Plan Note (Signed)
[facility]                           PRIMARY CARE OFFICE NOTE   NAME:[name], [name]                      MRN:          [id number]  DATE:10/06/2006                            DOB:          [birth year]    Ms. Munar was seen on Friday and Saturday, Friday for a physical exam,  Saturday for back pain.  The patient was sent for x-rays to rule out  vertebral compression fracture and this was negative.  Because of her  severe pain, she had been started on MS Contin 15 mg b.i.d., Toradol 10  mg t.i.d. and Tylenol.   The patient emailed me Sunday night to say that she now has had  increasing pain, is not well-controlled and now has an erythematous rash  with small vesicles.   The patient has been brought into the office urgently for evaluation.   PHYSICAL EXAMINATION:  On examination, she did have a classic  presentation for zoster in a T12 distribution on the left.   PLAN:  The patient is told to discontinue Toradol and Tylenol.  She is  started on Valtrex 1000 mg t.i.d. for 10 days, prednisone 20 mg daily  for 10 days.  She is given an handout from Up-To-Date for patients on  zoster.  She is told to use Domeboro soaks as needed.  The patient may  continue using MS Contin for severe pain; however, she does report some  generalize pruritus and nausea which may be a drug reaction and she is  cautioned about this.   The patient is asked to keep me posted as to her progress by email.     Rosalyn Gess Norins, MD  Electronically Signed    MEN/MedQ  DD: 10/06/2006  DT: 10/07/2006  Job #: [id number]

## 2010-11-16 NOTE — Discharge Summary (Signed)
NAMEKHYLA, MCCUMBERS                           ACCOUNT NO.:  [id number]   MEDICAL RECORD NO.:  [id number]                   PATIENT TYPE:  INP   LOCATION:  0451                                 FACILITY:  [facility]   PHYSICIAN:  Abigail Miyamoto, M.D.              DATE OF BIRTH:  25-Sep-1940   DATE OF ADMISSION:  04/27/2002  DATE OF DISCHARGE:  05/06/2002                                 DISCHARGE SUMMARY   SUMMARY OF HISTORY:  The patient is a 70 year old female who presented with  the sudden onset of left-sided abdominal pain which was diffuse in nature.  She was found on examination to have a diffusely tender abdomen, mostly on  the left side.  She also had a white blood count of 14.6.  A three-way of  the abdomen was performed in the emergency department and showed free air in  the diaphragm, therefore decision was made to proceed to the operating room  for exploration.   HOSPITAL COURSE:  The patient was admitted and taken directly to the  operating room where she underwent an exploratory laparotomy and was found  to have perforated sigmoid diverticulitis.  She underwent a sigmoid  colectomy and colostomy.  She tolerated the procedure well and was taken in  stable condition to the floor for bowel rest and IV antibiotics.  On  postoperative day one her white blood count had increased to 11.7 and  hemoglobin was stable at 10.2.  At this point she was started on sips of  liquids and pulmonary toilet instituted.  By postoperative day two her  ostomy was pink and her abdomen was soft, her Foley catheter was removed and  she was started on a clear liquid diet.  On postoperative day three she  continued to tolerate clears and was having good bowel sounds and her white  blood count at this time was normal at 6.3.  She was continued on liquids  for the next several days until her ostomy began putting out gas.  Home  health was arranged for ostomy care at this time.  By postoperative day six  she was having stool in her ostomy bag and was started on a regular diet and  her PCA was discontinued.  By postoperative day today she was tolerating a  regular diet, was doing well with her ostomy care, was on oral pain  medications and the decision was made to discharge the patient to home.   DISCHARGE DIAGNOSES:  Perforated sigmoid diverticulitis status post sigmoid  colectomy and colostomy.   DISCHARGE DIET:  Regular.   DISCHARGE ACTIVITIES:  She should do no heavy lifting greater than 20 pounds  for approximately five weeks.   DISCHARGE MEDICATIONS:  She will resume her home medications.  She will take  Vicodin and Advil for pain.   DISCHARGE INSTRUCTIONS:  She may shower.  Home health was arranged for  ostomy care, as  well as wound care.   FOLLOW UP:  She will follow up in my office in one week post discharge.                                               Abigail Miyamoto, M.D.    DB/MEDQ  D:  05/30/2002  T:  05/30/2002  Job:  [id number]

## 2010-11-16 NOTE — Op Note (Signed)
   NAME:  BEN, SANZ                         ACCOUNT NO.:  [id number]   MEDICAL RECORD NO.:  [id number]                   PATIENT TYPE:  AMB   LOCATION:  ENDO                                 FACILITY:  [facility]   PHYSICIAN:  Bernette Redbird, M.D.                DATE OF BIRTH:  June 12, 1941   DATE OF PROCEDURE:  12/15/2002  DATE OF DISCHARGE:                                 OPERATIVE REPORT   PROCEDURE:  Upper endoscopy with biopsies.   INDICATION:  A 70 year old female with roughly 10 year history of reflux  symptoms, now substantially improved compared to earlier, on just p.r.n.  Nexium.   FINDINGS:  Small hiatal hernia with esophageal ring.  No adverse sequela of  reflux identified.   DESCRIPTION OF PROCEDURE:  The nature, purpose, and risks of the procedure  had been discussed with the patient who provided written consent.  Sedation  was fentanyl 50 mcg and Versed 5 mg IV without arrhythmias or desaturation.  The Olympus video endoscope was passed under direct vision.  The vocal cords  looked normal.  The esophagus was endoscopically normal, without evidence of  free reflux, reflux esophagitis, Barrett's esophagus, varices, infection, or  neoplasia.   However, there was a widely patent esophageal mucosal ring (Schatzki's ring)  at the squamocolumnar junction.  Below this was a 2 cm hiatal hernia.   The stomach contained no significant residual and had normal mucosa without  evidence of gastritis, erosions, ulcers, polyps, or masses, and retroflexed  view of the proximal stomach showed just a slightly patulous diaphragmatic  hiatus.   The pylorus, duodenal bulb, and second duodenum looked normal.   The patient has a prior history of irregular bowel habits, now somewhat  improved, but I elected to go ahead and take random duodenal mucosal  biopsies to rule out celiac disease prior to removal of the scope.   The patient tolerated the procedure well, and there were no  apparent  complications.   IMPRESSION:  1. Small hiatal hernia  2. Esophageal ring, but no evidence of reflux-induced mucosal damage.    PLAN:  1. Await pathology on the duodenal biopsies.  2. Continue p.r.n. Nexium.  3. Dilatation if dysphagia symptoms develop in the future.                                               Bernette Redbird, M.D.    RB/MEDQ  D:  12/15/2002  T:  12/15/2002  Job:  [id number]   cc:   Stacie Acres. White, M.D.  [address]  [place]  [place] [address]  Fax: [phone]   Abigail Miyamoto, M.D.  [address]  [place]  [place] [address]  Fax: [phone]

## 2010-11-16 NOTE — Consult Note (Signed)
[name], [name]                         ACCOUNT NO.:  [id number]   MEDICAL RECORD NO.:  [id number]                   PATIENT TYPE:  INP   LOCATION:  2928                                 FACILITY:  [facility]   PHYSICIAN:  Bernette Redbird, M.D.                DATE OF BIRTH:  1940/09/22   DATE OF CONSULTATION:  05/12/2003  DATE OF DISCHARGE:                                   CONSULTATION   GASTROENTEROLOGY CONSULTATION:  Dr. Riley Kill asked me to see this 70 year old female for updated evaluation  of GERD.   The patient has been seen by me in the past for GERD symptoms, which have  been adequately controlled in the recent past by the use of sporadic Nexium  dosing, sometimes going for weeks without using it at all.  This p.r.n.  dosing seemed to work well until 10 days ago when she developed severe  belching, rumbling gas and the sense of pressure in the epigastric area.  Around that time, she called our office for a refill on the Nexium.   Note that she had endoscopy by me in June of this year which showed just a  small hiatal hernia and no evidence of esophagitis.   With that background, she was admitted to the hospital two days ago with  complete heart block and underwent pacemaker insertion yesterday.   At the present time, her upper tract symptoms are pretty well resolved  except for some nausea, and she does feel somewhat constipated.  In the  hospital, she has been getting Protonix once daily, plus sucralfate.   PAST MEDICAL HISTORY:  Is outlined on the chart, but from the GI perspective  is pertinent for perforated diverticulitis requiring laparotomy, temporary  colostomy and colostomy takedown about a year ago, and the fact that a  follow-up colonoscopy in June of this year showed just some mild residual  diverticulosis.   PHYSICAL EXAMINATION:  The patient is lying in bed in no acute distress.  CHEST:  Clear.  The heart sounds are normal to me, although the  cardiologist  did appreciate a soft rub this morning.  ABDOMEN:  Without evident mass or tenderness.   LABORATORY DATA:  Minimal elevation of liver chemistries at the time of  admission.  Specifically, AST was 47, ALT 89.  Alk. phos., bilirubin and  albumin were all normal.   IMPRESSION:  1. Nonspecific upper tract symptoms.  2. New onset elevation of liver chemistries.   PLAN:  1. I agree with repeating a CMET tomorrow to see if the liver chemistry     elevations persist in finding, in which case an abdominal ultrasound may     be needed to help rule out biliary tract disease.  2. Increase Protonix to 40 b.i.d., and stop sucralfate (the latter of which     is not usually effective in reflux).  3.     Dulcolax  suppository at this time for laxation.  4. At this time I do not see a firm indication for endoscopic evaluation,     but we could consider this if the patient remains symptomatic from the     upper tract perspective.                                               Bernette Redbird, M.D.    RB/MEDQ  D:  05/12/2003  T:  05/12/2003  Job:  [id number]   cc:   Arturo Morton. Riley Kill, M.D.   Rosalyn Gess Norins, M.D. [facility]

## 2010-11-16 NOTE — Discharge Summary (Signed)
NAMEMYRON, [name]               ACCOUNT NO.:  [id number]   MEDICAL RECORD NO.:  [id number]          PATIENT TYPE:  INP   LOCATION:  0480                         FACILITY:  [facility]   PHYSICIAN:  Abigail Miyamoto, M.D. DATE OF BIRTH:  [birth year]   DATE OF ADMISSION:  05/04/2004  DATE OF DISCHARGE:  05/09/2004                                 DISCHARGE SUMMARY   DISCHARGE DIAGNOSIS:  Incisional hernia status post laparoscopic hernia  repair with mesh.   SUMMARY OF HISTORY:  Ms. Gough is a very pleasant 70 year old female who has  had multiple abdominal procedures in the past secondary to perforated  diverticulitis and subsequent colostomy takedown.  She now presents with a  ventral incisional hernia.  Decision has been made to proceed to the  operating room for repair electively.   HOSPITAL COURSE:  The patient was admitted on May 04, 2004 and taken to  the operating room where she underwent a laparoscopic ventral hernia repair  with mesh as well as extensive laparoscopic lysis of adhesions.  She  tolerated the procedure well and was taken in a stable condition to the  regular surgical floor.  She had an uneventful postoperative recovery, but  just needed extensive pain management with IV pain control for her  discomfort.  On postoperative day 1, she was doing well.  Her white blood  count was normal as well as her hemoglobin which was 11.0.  She is  saturating 100% on room air.  She was continued on IV antibiotics.  By  postoperative day 3, her pain was much better and she was transitioned to  oral pain medications, but this was slow.  She has having mild constipation.  She was treated with Milk of Magnesia.  By postoperative day 5, she was  doing quite well.  She was passing flatus and having bowel movements. Her  pain control was better on the oral antibiotics.  Her incision was healing  well.  Decision was made to discharge the patient to home.   DISCHARGE MEDICATIONS:  She  will resume her home medications.  She will take  Vicodin and Advil for pain.   DISCHARGE ACTIVITIES:  She should do no heavy lifting greater than 20  pounds.   WOUND CARE:  She may shower.   DISCHARGE FOLLOWUP:  She will follow up with me in one week for recheck.      DB/MEDQ  D:  05/22/2004  T:  05/22/2004  Job:  [id number]

## 2010-11-16 NOTE — H&P (Signed)
NAMEHOMER, PFEIFER               ACCOUNT NO.:  [id number]   MEDICAL RECORD NO.:  [id number]          PATIENT TYPE:  INP   LOCATION:  1824                         FACILITY:  [facility]   PHYSICIAN:  Verne Grain, MD   DATE OF BIRTH:  September 29, 1940   DATE OF ADMISSION:  08/27/2004  DATE OF DISCHARGE:                                HISTORY & PHYSICAL   PRIMARY CARE PHYSICIAN:  Dr. Illene Regulus.   PRIMARY CARDIOLOGIST:  Dr. Riley Kill.   PRIMARY ELECTROPHYSIOLOGIST:  Dr. Graciela Husbands   CHIEF COMPLAINT:  Chest pain as well as cough and upper respiratory  symptoms with dyspnea on exertion x three and a half weeks, intermittent  nausea and belching, 16 pound weight loss over the past five months of  unclear etiology.   HISTORY OF PRESENT ILLNESS:  A 70 year old female with hypertension,  depression, osteoporosis, complete heart block status post Medtronic  pacemaker placement in November 2004 by Dr. Graciela Husbands, gastroesophageal reflux  disease with a small hiatal hernia, history of chest pain of unclear  etiology with negative thorough evaluation including transthoracic  echocardiogram/Adenosine Cardiolite/spiral CT/abdominal ultrasound, also  with history of indigestion/gastroesophageal reflux disease on chronic PPI  reports an episode of cough and fever with a temperature up to 102 degrees  approximately 3-1/2 weeks ago.  The patient reports being treated by her  primary physician with azithromycin.  She reports that the fever has  resolved; however the cough continues to linger productive of scant white  sputum and accompanied by fatigue, malaise, dyspnea on exertion/poor  exercise tolerance.  She has noted over the past two to three days  additional symptoms of intermittent nausea and belching of unclear  significance as well as chest pressure that radiates to her left armpit.  No clear shortness of breath related to the chest discomfort.  These appear  somewhat separate per patient's  description.  No clear aggravating or  relieving factors to patient's chest discomfort.  No clear associated  symptoms specifically related to the chest discomfort.  Patient reports that  she feels like she has not been able to participate in her usual activities  which in the past have included walking up to three miles up to five times  per week.  She reports having no energy since her illness three and a half  weeks ago.   REVIEW OF SYSTEMS:  Most notable for report of a 16 pound unintentional  weight loss over the past five months.  Patient's EKG is paced without  significant changes when compared to previous.  Patient's CK and troponin  are negative x1.  Chest x-ray reveals no acute changes.   PAST MEDICAL HISTORY:  1.  History of chest pain of unclear etiology with negative Adenosine      Cardiolite, ejection fraction 79% with normal wall motion.      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  Negative chest CT (repeat scan performed December 2005 to      confirm stability of previously noted right middle lobe nodule, no other      marked abnormalities commented upon).  Recent negative mammogram      (October 2005).  2.  History of complete heart block/bradycardia status post Medtronic      pacemaker (November 2004).  3.  History of incidentally discovered right middle lobe nodule on CT scan      March 2005 with repeat scan December 2005 revealing no change in the      size of this lesion.  4.  History of superficial thrombophlebitis.  5.  History of small hiatal hernia seen on abdominal CT as well as      gastroesophageal reflux disease followed by Dr. Matthias Hughs.  6.  Remote history of tobacco use (quit 30 years  ago).  7.  Hypertension.  8.  Depression.  9.  Osteoporosis.  10. History of hernia repair.  11. Hypothyroidism.   ALLERGIES/ADVERSE REACTIONS:  CODEINE causes nausea.   CURRENT MEDICATIONS:  1.  Nexium 40 mg p.o. b.i.d.  2.  Synthroid 112 mcg p.o. daily.  3.  Tylenol p.r.n.  4.  Osteoporosis injections daily.  5.  Enalapril 10 mg p.o. daily (patient is not completely sure that the dose      is 10 mg as this is a relatively new  medication from which she had been      changed from hydrochlorothiazide to enalapril).   SOCIAL HISTORY:  The patient lives in [place] with her daughter.  She is  no longer married.  She has two children who are healthy.  She has a remote  tobacco history.  Quit more than 30 years ago.  She has never consumed  alcohol on a regular basis.  She does not report any illicit drug use in her  past.  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   FAMILY HISTORY:  Patient's mother died at age 53 of unclear etiology.  She  was thought to have heart disease, diverticulitis, and hypertension prior  to her death.  Patient's father died at age 28 from congestive heart failure  reporting that the patient's heart failure developed later in life.  The  patient also has three siblings, one sister with diverticulitis and two  brothers with hypertension.   REVIEW OF SYSTEMS:  No fevers since episode three and a half weeks ago.  No  chills, sweats.  The patient has had 16 pound weight loss noted over the  five months which she says has been unintentional.  Exact etiology for this  weight loss is unclear as described above.  No headache.  No acute auditory  or visual changes.  No bowel or bladder complaints.  No acute rash reported.  Chest pain and dyspnea on exertion are endorsed.  No obvious orthopnea, PND.  No presyncope and palpitations or wheezing.  Patient does have cough that has been present since her illness three and a half weeks ago productive of   scant white sputum as mentioned above.  Patient has a history of depression.  She also complains of fatigue, feeling tired all the time.  Patient also  complains of indigestion which she attributed to her hiatal hernia in the  past.  The patient does not report any polyuria or polydipsia, heat or cold  intolerance, although she has a history of hypothyroidism.  She is  supplemented with Synthroid.  Review of systems is otherwise negative.  Review of systems for all other systems are negative.   PHYSICAL EXAMINATION:  VITAL SIGNS:  Temperature 97.8, heart rate 83,  respiratory rate 16, blood pressure 153/82, oxygen saturation 100% on room  air.  GENERAL:  The patient is alert, answers questions appropriately.  She is  cooperative.  HEENT:  She is normocephalic, atraumatic.  Extraocular eye movements are  intact.  Pupils are round and reactive to light.  Oropharynx is pink and  moist without lesions.  NECK:  Supple.  There is no bruits.  There is no jugular venous distention.  There is no lymphadenopathy appreciated.  The patient does appear thin and  almost somewhat wasted on examination.  CARDIOVASCULAR:  Regular S1 and S2.  There is a faint systolic flow murmur  that does not radiate consistent with known aortic valve thickening as  evidenced by previous echocardiogram.  LUNGS:  Fields clear to auscultation bilaterally.  Pacemaker generator site  in the patient's left infraclavicular region is normal-appearing with no  evidence of skin breakdown or other abnormalities.  ABDOMEN:  Soft, nontender, nondistended with positive bowel sounds.  The  patient does have mild epigastric discomfort to deep palpation.  However,  there does not appear to be significant discomfort in any other regions of  the patient's abdomen.  EXTREMITIES:  Warm and well perfused.  She has 2+ and symmetric distal  pulses.  There is no significant edema.  NEUROLOGIC:  Grossly nonfocal.  She is able to move all four  extremities  without difficulty.   Chest x-ray:  No acute disease.  Pacemaker appearing in a normal position  without any clear evidence of abnormality.   EKG appears to be paced with normal-functioning pacemaker with paced  morphology similar when compared to previous EKG October 2005.   LABORATORIES:  Hematocrit 36.  Sodium 139, potassium 3.5, chloride 104,  bicarbonate 27, BUN 12, creatinine 0.8, glucose 111.  CK-MB less than 1.0,  troponin I less than 0.05, myoglobin 26.   ASSESSMENT/PLAN:  A 70 year old female with hypertension, depression,  complete heart block status post pacemaker with episode of  chest pain of  unclear etiology.  Negative thorough evaluation in the past (November 2004)  now with similar discomfort that seems to follow an upper respiratory  illness that has a lingering cough productive of scant white sputum over the  past three and a half weeks.  Review of systems notable for 16 pound unintentional weight loss over the past five months.  Abdomen CT and chest  CT and mammogram negative within the past year.  Known right middle lobe  lung nodule stable on serial examinations between March and December 2005.  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  Patient  reports she had an EGD in the past but this has been greater than five years  ago per her report.   IMPRESSION AND PLAN:  1.  Atypical chest pain.  Will complete patient to rule out a myocardial      infarction and consider discharge to Dr. Rosalyn Charters clinic for      consideration of repeat Adenosine Cardiolite to exclude any unsuspected      ischemia.  This study can be  compared to the patient's previous study      that she had performed in November of 2004.  Will also write for the      patient to have a transthoracic echocardiogram  done to confirm that her      ejection fraction is normal to exclude any wall motion abnormalities and      to assess pacing wires for any unsuspected abnormality.  Will also order      a lipid profile in the morning for standard evaluation regarding      optimizing lipid status in this patient with no known coronary history,      but risk factors of hypertension and advanced age.  2.  Indigestion/weight loss/epigastric pain despite proton-pump inhibitor      therapy.  Patient may follow up with gastroenterology for consideration      of EGD.  We will continue her on Nexium currently and check screening      laboratory values to look for any unsuspected abnormality that might      explain the patient's difficulties/epigastric discomfort/fatigue of      unclear etiology.  Will check an amylase, lipase, white blood cell      count, B12, calcium, magnesium, TSH, free T4, ESR, as well as urinalysis      and urine culture.  3.  Mild suboptimal potassium.  Will supplement with 40 mEq p.o. x1.  4.  Hypertension.  Will continue enalapril as previously prescribed,      although will consider upward titration to 20 or even 40 mg in light of      the patient's suboptimal blood pressure control during her emergency      room stay.  The patient will need to have her creatinine and potassium      followed with upward titration of enalapril.  Will start with 10 mg p.o.      b.i.d.  5.  Hypothyroidism.  Check TSH and free T4 and continue Synthroid as      previously prescribed.  6.  Osteoporosis.  The patient is on daily injections of Forteo for      osteoporosis.  Will continue with p.o. supplementation of calcium and      vitamin D.  7.  History of depression.  Patient's weight loss of unclear etiology with      negative evaluations for atypical pain as well as indigestion, abdominal      discomfort, epigastric discomfort of unclear etiology.  Could perhaps     have a component of untreated  depression.  The patient may follow up      with her primary care physician who is more familiar with her care to      further assess what components might be attributed to depression and      what potential benefits might be realized with consideration of drug      therapy.      DDH/MEDQ  D:  08/27/2004  T:  08/27/2004  Job:  [id number]

## 2010-11-16 NOTE — Assessment & Plan Note (Signed)
[facility]                           PRIMARY CARE OFFICE NOTE   NAME:YOUNGHonesty, Menta                      MRN:          [id number]  DATE:10/03/2006                            DOB:          1940/12/17    Ms. Palecek is a 70 year old woman followed for hypothyroid disease,  hypertension, osteoporosis, who presents for followup evaluation and  exam.  She was last seen in the office June 18, 2006 for headache,  dizziness, and otalgia.  She was diagnosed with TMJ and treated with  aspercreme and antiinflammatory drugs, and did well.  She was diagnosed  with vestibular neuritis and was treated with Meclizine and Sudafed with  good results.   The patient reports that she has generally been doing well, except for  back pain.  She has been seeing Dr. Prince Rome.  She has had physical  therapy, which gave her only a modicum of relief, and she is currently  having increasing pain.  Dr. Prince Rome wants her to move forward with a CT  myelogram (pacemaker prevented MRI) for staging her for possible spinal  injection therapy.  I did talk with her about this.  I did check with  radiology to find out the relative risk, which she had been told was 10%  and, which on further discussion, is probably 1/100.   PAST MEDICAL HISTORY:   SURGICAL:  1. Exploratory laparotomy in 1996, which was negative.  2. 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  3. Takedown of colostomy in January of 2004.  4. Placement of PDVDP.   MEDICAL ILLNESS:  1. Usual childhood diseases.  2. Thyroid nodule on exogenous hormone suppression.  3. Severe osteoporosis having completed 1 year of Forteo therapy and      been off for 1 year.  4. Hypertension.  5. GERD.  6. Mild depression.   PHYSICIAN ROSTER:  Dr. Magnus Ivan for general surgery.  Dr. Bonnee Quin  for cardiology.  Dr. Berton Mount for electrophysiology.  Dr. Matthias Hughs for  GI.  Dr. Prince Rome for  orthopedic.   CURRENT MEDICATIONS:  1. Synthroid 112 mcg daily.  2. Multivitamin daily.  3. Lisinopril 20 mg daily.  4. Furosemide 20 mg daily.  5. Amrix 15 mg daily p.r.n.  6. Arthrotec p.r.n.   FAMILY HISTORY:  Significant for a maternal aunt with breast cancer.  Mother died at age 77 of natural causes.  Father died of congestive  heart failure.   SOCIAL HISTORY:  The patient is a Buyer, retail of [employer].  She  works for the [employer] in [employer].  She was  married for 22 years, divorced, and has remained single.  She has 1  grown son, 1 grown daughter.  She has 3 grandchildren, 2 of her children  live in [place].  She is retired.  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   REVIEW OF SYSTEMS:  Negative for fevers, sweats, or chills, or other  constitutional symptoms.  She has had an eye exam in  the last 24 months,  which was unremarkable.  The patient did have a dental abscess, which  responded to antibiotic therapy and she is scheduling herself for a root  canal.  No cardiovascular complaints and she is currently up to date  with Dr. Graciela Husbands and Dr. Riley Kill.  No respiratory problems.  The patient  has occasional heartburn, which she treats with Nexium on a p.r.n.  basis.  No GU complaints.  Musculoskeletal is significant for ongoing  low back pain.  No dermatologic, neurological, or psychiatric issues at  this time.   PHYSICAL EXAM:  Temperature is 98.1, blood pressure 180/89, pulse 80,  weight 136.  GENERAL APPEARANCE:  A well-developed, well-nourished woman who looks  her stated age.  In no acute distress.  HEENT:  Normocephalic, atraumatic.  EACs and TMs normal.  Oropharynx  with native dentition in good repair.  No buccal or palatal lesions were  noted.  Posterior oropharynx was clear.  Conjunctivae and sclerae were  clear.  PERRLA.  EOMI.  Funduscopic exam is unremarkable.  NECK:  Supple without thyromegaly.  NODES:  No  lymphadenopathy was noted in the cervical or supraclavicular  regions.  CHEST:  No CVA tenderness.  Lungs were clear to auscultation and  percussion.  BREASTS:  Deferred to gynecology.  CARDIOVASCULAR:  2+ radial pulses.  She has a pacemaker in the anterior  chest wall.  Her heart rate was regular to my exam with no murmurs,  rubs, or gallops.  ABDOMEN:  Soft, no guarding or rebound.  No organo-splenomegaly was  noted.  PELVIC:  Deferred to gynecology.  RECTAL:  Deferred to gynecology.  EXTREMITIES:  Without cyanosis, clubbing, or edema, deformity.  NEUROLOGIC:  Nonfocal.   ASSESSMENT AND PLAN:  1. Hypothyroidism disease.  The patient's TSH was checked today and is      0.11.  She will continue on her present medical regimen.  2. Cardiovascular.  The patient is currently up to date with seeing      Dr. Riley Kill and Dr. Graciela Husbands.  Her pacemaker has been interrogated and      is doing well.  She will follow up as instructed.  3. Hypertension.  The patient's blood pressure is poorly controlled.      Plan will be to increase the patient's lisinopril to 40 mg daily.      She will continue on Lasix.  We will see if she improves on this      regimen.  4. Back pain.  The patient with degenerative joint disease.  She is      followed by Dr. Prince Rome.  I have encouraged her to continue with him      and to have followup CT myelogram for possible injection therapy.  5. Osteoporosis.  The patient's last DEXA scan from July 22, 2006      showed that she had a slight increase in bone loss at the left hip,      going from a T score of -1.5 to -2.1.  Left femoral neck is      basically stable.  Will monitor this closely and she would be a      candidate for resuming Forteo if there is continued progression.      Will get a bone density study in 1 year.  6. Health maintenance.  The patient's last colonoscopy was in 2005     with Dr. Matthias Hughs.  The patient is to see her gynecologist today for  a  pelvic and Pap smear, as well as breast exam.  The patient did      have a mammogram in March of 2008.   SUMMARY:  This is a very pleasant woman with problems as outlined above,  who does seem medically stable.  She will return to see me on a p.r.n.  basis.     Rosalyn Gess Norins, MD  Electronically Signed    MEN/MedQ  DD: 10/04/2006  DT: 10/04/2006  Job #: [id number]   cc:   [name]

## 2010-11-16 NOTE — Assessment & Plan Note (Signed)
[facility]                            CARDIOLOGY OFFICE NOTE   NAME:[name], [name]                      MRN:          [id number]  DATE:11/04/2006                            DOB:          20-Feb-1941    HISTORY OF PRESENT ILLNESS:  [name] is in for follow up.  Cardiacwise, she really is doing quite well.  She had a pacemaker  checked today which was satisfactory.  She had the pacemaker placed for  heart block.  She has seen Dr. Graciela Husbands approximately six months ago and he  revised her antihypertensive regimen.  She has tolerated this  beautifully, and laboratory work done by Dr. Debby Bud recently has been  satisfactory.   CURRENT MEDICATIONS:  1. Synthroid 112 mcg daily.  2. Calcium.  3. Furosemide 20 mg daily.  4. Lisinopril HCT 20/12.5 daily.   PHYSICAL EXAMINATION:  GENERAL:  Well-appearing, no distress.  VITAL SIGNS:  Blood pressure 120/72, pulse 82, weight 135.  NECK:  She does have a goiter.  LUNGS:  Fields are clear.  HEART:  Pacer site is well healed.   STUDIES:  EKG reveals atrial tracking and ventricular pacing.   IMPRESSION:  Overall, this nice lady has continued to do well.  Overall,  her left ventricular function was preserved by echo, and there was a  minor increase in the aortic valve thickness with minor thickening of  the mitral valve.  There was mitral valve regurgitation which was  considered mild and currently not audible.  There was mild dilatation of  the right atrium.   Overall, this lady has done well.  We will see her back in follow up in  one year.  Dr. Graciela Husbands will see her in six months.     Arturo Morton. Riley Kill, MD, [facility]  Electronically Signed    TDS/MedQ  DD: 11/04/2006  DT: 11/05/2006  Job #: [id number]   cc:   Rosalyn Gess. Norins, MD

## 2010-12-14 ENCOUNTER — Encounter: Payer: Self-pay | Admitting: Internal Medicine

## 2011-02-01 ENCOUNTER — Ambulatory Visit (INDEPENDENT_AMBULATORY_CARE_PROVIDER_SITE_OTHER): Payer: Medicare Other | Admitting: Internal Medicine

## 2011-02-01 ENCOUNTER — Encounter: Payer: Self-pay | Admitting: Internal Medicine

## 2011-02-01 DIAGNOSIS — I10 Essential (primary) hypertension: Secondary | ICD-10-CM

## 2011-02-01 DIAGNOSIS — Z950 Presence of cardiac pacemaker: Secondary | ICD-10-CM | POA: Insufficient documentation

## 2011-02-01 DIAGNOSIS — I442 Atrioventricular block, complete: Secondary | ICD-10-CM

## 2011-02-01 LAB — PACEMAKER DEVICE OBSERVATION
AL AMPLITUDE: 2.8 mv
AL IMPEDENCE PM: 618 Ohm
BATTERY VOLTAGE: 2.67 V
RV LEAD AMPLITUDE: 22.4 mv

## 2011-02-01 MED ORDER — LABETALOL HCL 200 MG PO TABS: 200.0000 mg | ORAL_TABLET | Freq: Two times a day (BID) | ORAL | Status: DC

## 2011-02-01 MED ORDER — LISINOPRIL-HYDROCHLOROTHIAZIDE 20-12.5 MG PO TABS: ORAL_TABLET | ORAL | Status: DC

## 2011-02-01 NOTE — Assessment & Plan Note (Signed)
Table post-pacing

## 2011-02-01 NOTE — Progress Notes (Signed)
  HPI  [name] is a 70 y.o. female Seen n followup for a pacemaker implanted about 6 or 7 years ago for high-grade heart block and is now evolved into complete heart block.     The patient denies chest pain, shortness of breath, nocturnal dyspnea, orthopnea or peripheral edema.  There have been no palpitations, lightheadedness or syncope.   The patient has been blood pressure which has been elevated even since our last visit at which time we had increased her lisinopril HCT (which he did not take it night because of the diuretic effect) and had added amlodipine.  Review of previous data demonstrates an echo in 2009 at which time she had normal left ventricular function. chest x-rays were reviewed back to 2006 all of which showed stable (potentially dislodged) position of the atrial lead   Past Medical History  Diagnosis Date  . Heart block   . Diastolic dysfunction   . HTN (hypertension)   . Acute neck pain   . Tension headache   . Multinodular goiter   . SOB (shortness of breath)   . Contact dermatitis and eczema     unspec cause  . Tooth abscess   . Thyroid nodule   . Osteoporosis   . GERD (gastroesophageal reflux disease)   . Depression   . Lung nodule   . Superficial thrombophlebitis   . Hypothyroidism   . Acute sinusitis   . History of colonoscopy     Past Surgical History  Procedure Date  . Laporatomy for ruptured diverticulum with colostomy 2003  . Take down of colostomy 2004  . Exploratory laparotomy   . 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 Hernia repair     Current Outpatient Prescriptions  Medication Sig Dispense Refill  . amLODipine (NORVASC) 2.5 MG tablet Take 2.5 mg by mouth daily.        . Chlorpheniramine-APAP (CORICIDIN) 2-325 MG TABS Take by mouth as needed.        [name] denosumab (PROLIA) 60 MG/ML SOLN Inject 60 mg into the skin once. One injection every 6 months       . famotidine (PEPCID) 20 MG tablet Take 20 mg by mouth 2 (two) times daily.          [name] ibuprofen (ADVIL,MOTRIN) 200 MG tablet Take 200 mg by mouth every 6 (six) hours as needed.        [name] levothyroxine (SYNTHROID, LEVOTHROID) 100 MCG tablet Take 100 mcg by mouth daily.        [name] lisinopril-hydrochlorothiazide (PRINZIDE,ZESTORETIC) 20-12.5 MG per tablet Take 1 tablet by mouth 2 (two) times daily.        . Multiple Vitamin (MULTIVITAMIN) capsule Take 1 capsule by mouth daily.          Allergies  Allergen Reactions  . Codeine     REACTION: N/V    Review of Systems negative except from HPI and PMH  Physical Exam Well developed and well nourished in no acute distress HENT normal E scleral and icterus clear Neck Supple JVP flat; carotids brisk and full Clear to ausculation Regular rate and rhythm, no murmurs gallops or rub Soft with active bowel sounds No clubbing cyanosis and edema Alert and oriented, grossly normal motor and sensory function Skin Warm and Dry    Assessment and  Plan

## 2011-02-01 NOTE — Patient Instructions (Signed)
Your physician has recommended you make the following change in your medication:  1) Take lisinopril/hctz 20/12.5mg  two tablets by mouth every morning. 2) Start labetalol 200mg  one tablet by mouth twice daily.  Your physician recommends that you return for lab work in: 2 weeks- bmet (401.9).  Your physician recommends that you schedule a follow-up appointment in: 3 months with Paula/Kristin

## 2011-02-01 NOTE — Assessment & Plan Note (Signed)
Poorly controlled. We will discontinue the amlodipine which had no benefits. We will consolidate her lisinopril to 40/12.5 and add labetalol 200 b.i.d. Side effects were reviewed

## 2011-02-01 NOTE — Assessment & Plan Note (Signed)
Her device is approaching ERI. We have reviewed the potential symptoms associated with VVI reversion. We'll see her in 3 months

## 2011-02-05 ENCOUNTER — Telehealth: Payer: Self-pay | Admitting: Internal Medicine

## 2011-02-05 DIAGNOSIS — I10 Essential (primary) hypertension: Secondary | ICD-10-CM

## 2011-02-05 NOTE — Telephone Encounter (Signed)
Pt called and said Dr. Graciela Husbands prescribed a new medication for her blood pressure.  Her blood pressure is not high, it is her mistake.  What should she do, continue new medication or not take.  Please call patient back.

## 2011-02-05 NOTE — Telephone Encounter (Signed)
I spoke with the patient. She states that when she was in the office last and her BP was elevated, that Dr. Graciela Husbands asked her if that was usual for her. She said it was, but she calls back today stating that she must have been thinking what it was running prior to starting her initial BP meds. She has tracked her readings periodically over the last couple of months with readings from 115-139/60-80. She has not started the new meds we prescribed at her last office visit. She also states that she was not consistently taking her lisinopril/ hctz 20/12.5 bid. She usually just takes it once because she forgets the second dose. I have advised her to continue her amlodipine 2.5mg  once daily and her lisinopril/ hctz 20/12.5mg  once daily. She will take her BP every day in the morning after taking her meds for the next 2 weeks. She will call us in 2 weeks and let us know what her readings are and we will then decide what her meds need to be. She is agreeable. We will cancel her lab appointment for her bmet coming up. I will forward to Dr. Graciela Husbands as an Lorain Childes.

## 2011-02-15 ENCOUNTER — Other Ambulatory Visit: Payer: Medicare Other | Admitting: *Deleted

## 2011-02-19 ENCOUNTER — Telehealth: Payer: Self-pay | Admitting: Internal Medicine

## 2011-02-19 ENCOUNTER — Other Ambulatory Visit: Payer: Self-pay | Admitting: Internal Medicine

## 2011-02-19 NOTE — Telephone Encounter (Signed)
The patient was seen on 8/3 and given instructions for medication adjustments. She called back on 8/7 and stated her BP's were not high at home and that she was not consistently taking her meds. Per 8/7 phone note, the patient was instructed to continue amlodipine 2.5mg  once daily and lisinopril/hctz 20/12.5mg  once daily. She was to call with BP readings over the last 2 weeks. I will forward these to Dr. Graciela Husbands for review and recommendations.

## 2011-02-19 NOTE — Telephone Encounter (Signed)
Per pt call. Pt was told to provide BP readings for past two weeks:  8/7: L:129/64 HR 91   R:11564 HR 89 8/8: L:119/69 HR 78  R: 122/70 HR 77 8/9: L:116/61 HR 105  R: 114/64 HR: 104 8/10 L:126/60 HR: 86  R: 134/72 HR 86 8/11: L 131/70 HR 78  R: 144/75 HR: 78 8/12: L128/64 HR 91  R: 133/66 HR 91 8/13: L 129/61 HR 88  R 117/92 HR87 8/14: L 115/63 HR 87 (PT didn't take BP in R arm)  8/15 (PT didn't take BP) 8/16 L: 140/70 HR 80-----Pt hurt back and was in pain, taking advil-could be the cause of increased BP  R: 144/75 HR: 80 8/17 L : 145/76 HR: 76  R: 151/83 HR: 76 8/18 L:140/60 HR: 85  R: 132/69 HR 85 8/19 L: 126/60 HR 80  R: 118/63 HR 79 8/20 (PT didn't take BP)  8/21 (PT hasn't taken BP yet)   Pt said if it is recommnded that pt continue to take medications. Pt needs a 90 day supply of 2.5mg /day of amilodipine called into the [facility] on [address] in [place].

## 2011-02-27 NOTE — Telephone Encounter (Signed)
Do we have the BP measurmente steve

## 2011-03-01 NOTE — Telephone Encounter (Signed)
BP readings are at the bottom of this phone note.

## 2011-03-05 NOTE — Telephone Encounter (Signed)
BP look reasonably well controlled  Would continue meds

## 2011-03-06 ENCOUNTER — Telehealth: Payer: Self-pay | Admitting: Internal Medicine

## 2011-03-06 NOTE — Telephone Encounter (Signed)
Pt aware of Dr. Klein's recommendations.  

## 2011-03-06 NOTE — Telephone Encounter (Signed)
I spoke with the patient. She states that she recently lost a close companion of over 30 years on 8/24. She explains that this was very sudden. She was in [place] when she got the call about her friend passing & she had been speaking with him 23 minutes prior. She states that yesterday she went to pick up his belongings at the nursing home and she was also at the same [facility] she had been at when she received news of his passing. She states that upon entering [facility], that she automatically felt fatigued and had palpitations. She did not think she was really thinking about her friend, but is not sure. She thinks her BP and HR are running normal for her. She states that she feels anxious. I explained that what she is feeling could e related to this emotional change she has had recently. I have recommended that she observe her symptoms for the next few days and if her VS are stable, but she still feels anxious and fatigued, that she touch base with Dr. Debby Bud about giving her something for anxiety. She voices understanding and is agreeable.

## 2011-03-06 NOTE — Telephone Encounter (Signed)
Pt calling to report that she is experiencing tiredness, weakness, yesterday sob. Pt has been laboring a little bit, not extremely today. Pt also c/o headaches. Please return pt call to discuss further.

## 2011-03-07 ENCOUNTER — Ambulatory Visit (INDEPENDENT_AMBULATORY_CARE_PROVIDER_SITE_OTHER): Payer: Medicare Other | Admitting: Internal Medicine

## 2011-03-07 ENCOUNTER — Other Ambulatory Visit (INDEPENDENT_AMBULATORY_CARE_PROVIDER_SITE_OTHER): Payer: Medicare Other

## 2011-03-07 DIAGNOSIS — I10 Essential (primary) hypertension: Secondary | ICD-10-CM

## 2011-03-07 DIAGNOSIS — E875 Hyperkalemia: Secondary | ICD-10-CM

## 2011-03-07 DIAGNOSIS — E042 Nontoxic multinodular goiter: Secondary | ICD-10-CM

## 2011-03-07 DIAGNOSIS — F329 Major depressive disorder, single episode, unspecified: Secondary | ICD-10-CM

## 2011-03-07 DIAGNOSIS — F3289 Other specified depressive episodes: Secondary | ICD-10-CM

## 2011-03-07 DIAGNOSIS — I77819 Aortic ectasia, unspecified site: Secondary | ICD-10-CM

## 2011-03-07 DIAGNOSIS — K219 Gastro-esophageal reflux disease without esophagitis: Secondary | ICD-10-CM

## 2011-03-07 LAB — CBC WITH DIFFERENTIAL/PLATELET
Basophils Relative: 0.3 % (ref 0.0–3.0)
Eosinophils Absolute: 0.1 10*3/uL (ref 0.0–0.7)
HCT: 40.2 % (ref 36.0–46.0)
Hemoglobin: 13.5 g/dL (ref 12.0–15.0)
Lymphocytes Relative: 25.5 % (ref 12.0–46.0)
Lymphs Abs: 1.5 10*3/uL (ref 0.7–4.0)
MCHC: 33.6 g/dL (ref 30.0–36.0)
Monocytes Relative: 11 % (ref 3.0–12.0)
Neutro Abs: 3.6 10*3/uL (ref 1.4–7.7)
RBC: 4.85 Mil/uL (ref 3.87–5.11)
RDW: 13.2 % (ref 11.5–14.6)

## 2011-03-07 LAB — COMPREHENSIVE METABOLIC PANEL
Alkaline Phosphatase: 47 U/L (ref 39–117)
BUN: 19 mg/dL (ref 6–23)
Glucose, Bld: 103 mg/dL — ABNORMAL HIGH (ref 70–99)
Total Bilirubin: 0.5 mg/dL (ref 0.3–1.2)

## 2011-03-07 LAB — TSH: TSH: 0.06 u[IU]/mL — ABNORMAL LOW (ref 0.35–5.50)

## 2011-03-07 MED ORDER — SODIUM POLYSTYRENE SULFONATE 15 GM/60ML PO SUSP: 15.0000 g | Freq: Once | ORAL | Status: AC

## 2011-03-07 NOTE — Progress Notes (Signed)
Subjective:    Patient ID: [name], female    DOB: 1941/02/25, 70 y.o.   MRN: [id number]  HPI [name] was last seen in primary care in Dec '10. In the interval she has had close follow-up by Dr. Graciela Husbands in the device clinic - she is being seen more frequently as her device battery approaches replacment. She has also followed with Dr. Andrey Cota. She has been doing well: no hospitalizations or major illness.  She presents today for increased pressure in the epigastric area with increased eructation. She is also feeling more anxious until 11/15/2022. August 24th her companion of 32 years, who had been very ill for several years and at a NH, passed away. That 2022/11/15 she went to pick up his belongings and then went to [employer]: she had the onset of sweating and tearing anda  feeling very weak started. She is now having ear pressure, and frontal sinus pressure and feeling ill. She is also not sleeping very well.   Past Medical History  Diagnosis Date  . Heart block   . Diastolic dysfunction   . HTN (hypertension)   . Acute neck pain   . Tension headache   . Multinodular goiter   . SOB (shortness of breath)   . Contact dermatitis and eczema     unspec cause  . Tooth abscess   . Thyroid nodule   . Osteoporosis   . GERD (gastroesophageal reflux disease)   . Depression   . Lung nodule   . Superficial thrombophlebitis   . Hypothyroidism   . Acute sinusitis   . History of colonoscopy    Past Surgical History  Procedure Date  . Laporatomy for ruptured diverticulum with colostomy 11/12/01  . Take down of colostomy 2002/11/13  . Exploratory laparotomy   . 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 Hernia repair    Family History  Problem Relation Age of Onset  . Heart disease Mother   . Heart failure Father   . Diverticulosis Sister   . Hypertension Brother   . Hypertension Brother    History   Social History  . Marital Status: Single    Spouse Name: N/A    Number of Children: 2  . Years of  Education: 16   Occupational History  . 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     Social History Main Topics  . Smoking status: Former Games developer  . Smokeless tobacco: Never Used   Comment: quit 30 years ago  . Alcohol Use: Yes  . Drug Use: No  . Sexually Active: No   Other Topics Concern  . Not on file   Social History Narrative   The patient is a Buyer, retail of [employer].  She worked for the [employer] in [employer].  She was married for 22 years, divorced, and has remained single. She has 1 grown son, 1 grown daughter.  She has 3 grandchildren, 2 of her childrenlive in [place].  She is retired.  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       Review of Systems System review is negative for any constitutional, cardiac, pulmonary, or neuro symptoms or complaints     Objective:   Physical Exam Vitals reviewed and normal Gen'l - WNWD AA woman in no distress HEENT - TMs normal, throat clear, no tenderness to percussion over the frontal or maxillary sinus  Neck- fullness left neck (enlarged left lobe thyroid) that is not tender Cor - RRR Chest - CTAP Abdomen - a fullness just below the epigastrum in the midline, pulsatile. Tenderness in the gastric area       Assessment & Plan:  Hyperkalemia - routine labs obtained after this visit revealed a Potassium of 6.4. She was notified and instructed to return to the office of an EKG. This was OK with no evidence of potassium cardiotoxicity. She was started on kayexelate - 15 g x two doses at 6 hour intervals. She returned for follow-up lab which revealed a potassium of 6.0. She was given a third dose of kayexalate and returned Saturday, Spet 8th for lab which revealed a potassium of 4.3. She had remained asymptomatic. The origin of her hyperkalemia remains unknown: she has normal renal function, no exogenous  potassium, no medication that would be responsible.  Plan - she will return Sept 13th for repeat potassium level. If it is again high will consult with nephrology.

## 2011-03-07 NOTE — Patient Instructions (Signed)
Grief and loss - some of your symptoms of anxiousness, poor sleep and heavy breathing may all be related to a grief reaction with anxiety. Plan - please consider grief counseling. Also may use alprazolam 0.25 mg every 6 hours as needed for excessive anxiety or symptoms.  Eructation and substernal pressure - consistent with gastric irritation = dyspepsia. Plan be sure to take the pepcid TWICE a day until the chest pressure and eructation are better then you can drop back to taking it as needed.  Goiter- the left neck seems larger and it has been a while since you had an ultrasound to monitor the goiter. Plan - ultrasound of the neck.  Palpable aorta in the upper abdomen. Plan - routine abdominal ultrasound to rule out enlargement of the aorta.  Blood pressure:  BP Readings from Last 3 Encounters:  03/07/11 128/80  02/01/11 163/77  06/22/10 130/80   Pretty much good control.   Upper respiratory congestion - no sign of infection. Plan - behind the counter generic sudafed 30 mg twice a day, nasal saline spray/wash, gargle of choice.

## 2011-03-08 ENCOUNTER — Telehealth: Payer: Self-pay | Admitting: *Deleted

## 2011-03-08 ENCOUNTER — Other Ambulatory Visit (INDEPENDENT_AMBULATORY_CARE_PROVIDER_SITE_OTHER): Payer: Medicare Other

## 2011-03-08 DIAGNOSIS — E875 Hyperkalemia: Secondary | ICD-10-CM

## 2011-03-08 NOTE — Telephone Encounter (Signed)
Stat lab order ready for pick up tomorrow.

## 2011-03-08 NOTE — Telephone Encounter (Signed)
Pt is calling to get an update on her potassium level and as to how she should take the medication that was prescribed to her yesterday. Please Advise pt had potassium level rechecked today

## 2011-03-08 NOTE — Telephone Encounter (Signed)
Received lab results on potassium level for pt per MD informed pt to take last dose of Sodium Polystyrene and to come here to the office in the morning per MD to pick up lab orders from sarah to have her lab work done at [facility] to recheck her potassium

## 2011-03-09 ENCOUNTER — Encounter: Payer: Self-pay | Admitting: Internal Medicine

## 2011-03-09 NOTE — Assessment & Plan Note (Signed)
BP Readings from Last 3 Encounters:  03/07/11 128/80  02/01/11 163/77  06/22/10 130/80   Adequate control

## 2011-03-09 NOTE — Assessment & Plan Note (Signed)
Increased eructation and tenderness on exam suggest hyperaciditiy.  Plan - take her Pepcid twice a day until her symptoms are improved.           Abdominal U/S to r/o hepatic, pancreatic or GB abnormality          For unrelieved symptoms - GI consult.

## 2011-03-09 NOTE — Assessment & Plan Note (Signed)
Very prominent left lob of thyroid. It has been several years since she has had imaging evaluation.  Plan - U/S neck

## 2011-03-09 NOTE — Assessment & Plan Note (Signed)
Recent loss of a long term companion with appropriate grief reaction.  Plan - she is encouraged to consult with [facility] for grief counseling and is provided the tele #

## 2011-03-12 ENCOUNTER — Telehealth: Payer: Self-pay | Admitting: *Deleted

## 2011-03-12 NOTE — Telephone Encounter (Signed)
Rec pts summary of benefit from Prolia Plus for upcoming Prolia due 04-10-11. Her OOP is $0. Pt informed and transferred to scheduler to make nurse visit for next mo.

## 2011-03-14 ENCOUNTER — Ambulatory Visit
Admission: RE | Admit: 2011-03-14 | Discharge: 2011-03-14 | Disposition: A | Discharge status: Home / Self Care | Payer: Medicare Other | Source: Ambulatory Visit | Attending: Internal Medicine | Admitting: Internal Medicine

## 2011-03-14 ENCOUNTER — Other Ambulatory Visit (INDEPENDENT_AMBULATORY_CARE_PROVIDER_SITE_OTHER): Payer: Medicare Other

## 2011-03-14 DIAGNOSIS — E042 Nontoxic multinodular goiter: Secondary | ICD-10-CM

## 2011-03-14 DIAGNOSIS — E875 Hyperkalemia: Secondary | ICD-10-CM

## 2011-03-14 LAB — POTASSIUM: Potassium: 4.1 meq/L (ref 3.5–5.1)

## 2011-03-18 ENCOUNTER — Encounter: Payer: Self-pay | Admitting: Internal Medicine

## 2011-03-26 LAB — POCT I-STAT, CHEM 8
Chloride: 106
Glucose, Bld: 90
HCT: 38
Hemoglobin: 12.9
Potassium: 3.8
Sodium: 139

## 2011-03-26 LAB — BASIC METABOLIC PANEL
BUN: 13
Calcium: 9.3
Creatinine, Ser: 0.81
GFR calc non Af Amer: >60
Glucose, Bld: 108 — ABNORMAL HIGH

## 2011-03-26 LAB — POCT CARDIAC MARKERS
CKMB, poc: <1 — ABNORMAL LOW
Myoglobin, poc: 38.2
Operator id: 285491
Troponin i, poc: <0.05

## 2011-03-26 LAB — APTT: aPTT: 28

## 2011-03-26 LAB — CBC
HCT: 39.5
Hemoglobin: 12.9
Hemoglobin: 13.7
MCV: 80.7
Platelets: 198
Platelets: 199
RDW: 12.8
RDW: 12.8

## 2011-03-26 LAB — PROTIME-INR: INR: 1.1

## 2011-03-26 LAB — TSH: TSH: 0.064 — ABNORMAL LOW

## 2011-03-26 LAB — CARDIAC PANEL(CRET KIN+CKTOT+MB+TROPI)
Relative Index: INVALID
Troponin I: 0.01
Troponin I: <0.01

## 2011-03-26 LAB — MAGNESIUM: Magnesium: 2

## 2011-03-28 ENCOUNTER — Other Ambulatory Visit: Payer: Self-pay | Admitting: Internal Medicine

## 2011-04-10 ENCOUNTER — Ambulatory Visit (INDEPENDENT_AMBULATORY_CARE_PROVIDER_SITE_OTHER): Payer: Medicare Other

## 2011-04-10 DIAGNOSIS — M810 Age-related osteoporosis without current pathological fracture: Secondary | ICD-10-CM

## 2011-04-10 DIAGNOSIS — Z23 Encounter for immunization: Secondary | ICD-10-CM

## 2011-04-10 MED ORDER — DENOSUMAB 60 MG/ML ~~LOC~~ SOLN: 60.0000 mg | Freq: Once | SUBCUTANEOUS | Status: DC

## 2011-05-06 ENCOUNTER — Other Ambulatory Visit: Payer: Self-pay | Admitting: Internal Medicine

## 2011-05-06 ENCOUNTER — Ambulatory Visit (INDEPENDENT_AMBULATORY_CARE_PROVIDER_SITE_OTHER): Payer: Medicare Other | Admitting: *Deleted

## 2011-05-06 DIAGNOSIS — I442 Atrioventricular block, complete: Secondary | ICD-10-CM

## 2011-05-06 LAB — PACEMAKER DEVICE OBSERVATION
ATRIAL PACING PM: 0
BAMS-0001: 175 {beats}/min
RV LEAD IMPEDENCE PM: 662 Ohm
VENTRICULAR PACING PM: 100

## 2011-05-06 NOTE — Progress Notes (Signed)
PPM battery check only. 

## 2011-06-14 ENCOUNTER — Telehealth: Payer: Self-pay | Admitting: Internal Medicine

## 2011-06-14 NOTE — Telephone Encounter (Signed)
New Msg: pt calling stating that she was in the office for a pacer check one month ago and was told if pt was feeling anything different to call. Pt stating that she is feeling different today. Pt c/o labored breathing when going up stairs. Please return pt call to discuss further.

## 2011-06-14 NOTE — Telephone Encounter (Signed)
I spoke with the patient. She states that today she started to have problems getting SOB when walking to the top of her steps. She states this is new as of today, but she is concerned this is related to her PPM. She states this may also be a problem with her reflux. I advised she come in Monday and let us check her device. If she worsens over the weekend, I have advised she report to the ER. She verbalizes understanding.

## 2011-06-17 ENCOUNTER — Encounter: Payer: Self-pay | Admitting: Internal Medicine

## 2011-06-17 ENCOUNTER — Ambulatory Visit (INDEPENDENT_AMBULATORY_CARE_PROVIDER_SITE_OTHER): Payer: Medicare Other | Admitting: *Deleted

## 2011-06-17 DIAGNOSIS — I442 Atrioventricular block, complete: Secondary | ICD-10-CM

## 2011-06-17 LAB — PACEMAKER DEVICE OBSERVATION
ATRIAL PACING PM: 1
BAMS-0001: 175 {beats}/min
RV LEAD IMPEDENCE PM: 706 Ohm
VENTRICULAR PACING PM: 100

## 2011-06-17 NOTE — Progress Notes (Signed)
PPM battery check 

## 2011-06-20 ENCOUNTER — Encounter: Payer: Self-pay | Admitting: Internal Medicine

## 2011-07-10 ENCOUNTER — Other Ambulatory Visit: Payer: Self-pay | Admitting: Internal Medicine

## 2011-07-10 ENCOUNTER — Telehealth: Payer: Self-pay | Admitting: Internal Medicine

## 2011-07-10 DIAGNOSIS — I10 Essential (primary) hypertension: Secondary | ICD-10-CM

## 2011-07-10 NOTE — Telephone Encounter (Signed)
Mailed out Pt.'s labs upon request on 07/10/11.

## 2011-07-18 ENCOUNTER — Ambulatory Visit (INDEPENDENT_AMBULATORY_CARE_PROVIDER_SITE_OTHER): Payer: Medicare Other | Admitting: *Deleted

## 2011-07-18 ENCOUNTER — Encounter: Payer: Self-pay | Admitting: Internal Medicine

## 2011-07-18 DIAGNOSIS — Z950 Presence of cardiac pacemaker: Secondary | ICD-10-CM

## 2011-07-18 DIAGNOSIS — I442 Atrioventricular block, complete: Secondary | ICD-10-CM

## 2011-07-18 NOTE — Progress Notes (Signed)
Pacer check in clinic  

## 2011-08-07 ENCOUNTER — Encounter: Payer: Medicare Other | Admitting: *Deleted

## 2011-08-19 ENCOUNTER — Encounter: Payer: Self-pay | Admitting: Internal Medicine

## 2011-08-19 ENCOUNTER — Ambulatory Visit (INDEPENDENT_AMBULATORY_CARE_PROVIDER_SITE_OTHER): Payer: Medicare Other | Admitting: *Deleted

## 2011-08-19 DIAGNOSIS — I442 Atrioventricular block, complete: Secondary | ICD-10-CM

## 2011-08-19 LAB — PACEMAKER DEVICE OBSERVATION
BAMS-0001: 175 {beats}/min
RV LEAD IMPEDENCE PM: 647 Ohm
VENTRICULAR PACING PM: 100

## 2011-08-19 NOTE — Progress Notes (Signed)
PPM battery check only. 

## 2011-09-09 ENCOUNTER — Ambulatory Visit (INDEPENDENT_AMBULATORY_CARE_PROVIDER_SITE_OTHER): Payer: Medicare Other | Admitting: *Deleted

## 2011-09-09 ENCOUNTER — Encounter: Payer: Self-pay | Admitting: Internal Medicine

## 2011-09-09 DIAGNOSIS — I442 Atrioventricular block, complete: Secondary | ICD-10-CM

## 2011-09-09 LAB — PACEMAKER DEVICE OBSERVATION

## 2011-09-09 NOTE — Progress Notes (Signed)
PPM-Battery check

## 2011-09-12 ENCOUNTER — Telehealth: Payer: Self-pay | Admitting: Internal Medicine

## 2011-09-12 ENCOUNTER — Ambulatory Visit (INDEPENDENT_AMBULATORY_CARE_PROVIDER_SITE_OTHER): Payer: Medicare Other | Admitting: Internal Medicine

## 2011-09-12 ENCOUNTER — Encounter: Payer: Self-pay | Admitting: *Deleted

## 2011-09-12 ENCOUNTER — Encounter: Payer: Self-pay | Admitting: Internal Medicine

## 2011-09-12 VITALS — BP 122/68 | HR 64 | Ht 65.0 in | Wt 141.8 lb

## 2011-09-12 DIAGNOSIS — Z01810 Encounter for preprocedural cardiovascular examination: Secondary | ICD-10-CM

## 2011-09-12 DIAGNOSIS — I519 Heart disease, unspecified: Secondary | ICD-10-CM

## 2011-09-12 DIAGNOSIS — R51 Headache: Secondary | ICD-10-CM

## 2011-09-12 DIAGNOSIS — I5189 Other ill-defined heart diseases: Secondary | ICD-10-CM

## 2011-09-12 DIAGNOSIS — R0602 Shortness of breath: Secondary | ICD-10-CM

## 2011-09-12 DIAGNOSIS — I10 Essential (primary) hypertension: Secondary | ICD-10-CM

## 2011-09-12 DIAGNOSIS — K219 Gastro-esophageal reflux disease without esophagitis: Secondary | ICD-10-CM

## 2011-09-12 DIAGNOSIS — Z950 Presence of cardiac pacemaker: Secondary | ICD-10-CM

## 2011-09-12 DIAGNOSIS — I442 Atrioventricular block, complete: Secondary | ICD-10-CM

## 2011-09-12 NOTE — Telephone Encounter (Signed)
Explained to the patient this is usually a once a day med.

## 2011-09-12 NOTE — Patient Instructions (Signed)
Your physician has recommended that you have a pacemaker generator change. Please see the instruction sheet given to you today for more information.    

## 2011-09-12 NOTE — Assessment & Plan Note (Signed)
As above.

## 2011-09-12 NOTE — Progress Notes (Signed)
  HPI  [name] is a 71 y.o. 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 Heart block   . Diastolic dysfunction   . HTN (hypertension)   . Acute neck pain   . Tension headache   . Multinodular goiter   . SOB (shortness of breath)   . Contact dermatitis and eczema     unspec cause  . Tooth abscess   . Thyroid nodule   . Osteoporosis   . GERD (gastroesophageal reflux disease)   . Depression   . Lung nodule   . Superficial thrombophlebitis   . Hypothyroidism   . Acute sinusitis   . History of colonoscopy     Past Surgical History  Procedure Date  . Laporatomy for ruptured diverticulum with colostomy 2003  . Take down of colostomy 2004  . Exploratory laparotomy   . 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 Hernia repair     Current Outpatient Prescriptions  Medication Sig Dispense Refill  . amLODipine (NORVASC) 2.5 MG tablet TAKE ONE TABLET BY MOUTH EVERY DAY  30 tablet  10  . Chlorpheniramine-APAP (CORICIDIN) 2-325 MG TABS Take by mouth as needed.        [name] denosumab (PROLIA) 60 MG/ML SOLN Inject 60 mg into the skin once. One injection every 6 months       . famotidine (PEPCID) 20 MG tablet Take 20 mg by mouth as needed.       [name] ibuprofen (ADVIL,MOTRIN) 200 MG tablet Take 200 mg by mouth every 6 (six) hours as needed.        [name] levothyroxine (SYNTHROID, LEVOTHROID) 100 MCG tablet TAKE ONE TABLET BY MOUTH EVERY DAY  90 tablet  1  . lisinopril-hydrochlorothiazide (PRINZIDE,ZESTORETIC) 20-12.5 MG per tablet  Take 1 tablet by mouth daily.      . Multiple Vitamin (MULTIVITAMIN) capsule Take 1 capsule by mouth daily.         Current Facility-Administered Medications  Medication Dose Route Frequency Provider Last Rate Last Dose  . denosumab (PROLIA) injection 60 mg  60 mg Subcutaneous Once Jacques Navy, MD        Allergies  Allergen Reactions  . Codeine     REACTION: N/V    Review of Systems negative except from HPI and PMH  Physical Exam BP 122/68  Pulse 64  Ht 5\' 5"  (1.651 m)  Wt 141 lb 12.8 oz (64.32 kg)  BMI 23.60 kg/m2  SpO2 100% Well developed and nourished in no acute distress HENT normal Neck supple with JVP-flat  Cannon a waves Clear Regular rate and rhythm, no murmurs or gallops Abd-soft with active BS  Palpable mass No Clubbing cyanosis edema Skin-warm and dry A & Oriented  Grossly normal sensory and motor function   Assessment and  Plan

## 2011-09-12 NOTE — Assessment & Plan Note (Signed)
Device dependent  For gen change  We have reviewed the benefits and risks of generator replacement.  These include but are not limited to lead fracture and infection.  The patient understands, agrees and is willing to proceed.

## 2011-09-12 NOTE — Assessment & Plan Note (Signed)
We reveiewed data for PPI and osteoporosis.  Given symptoms, she will undertake a short term trial and see if it improves  She may benefit from GI eval  The thumping that she feels has been intermittent and not related to reversion  I doubt is diagraghmatic stimulation

## 2011-09-12 NOTE — Telephone Encounter (Signed)
New msg Pt wants to talk to you about med-omeprazole She wants to know how to take this med.

## 2011-09-13 LAB — BASIC METABOLIC PANEL
BUN: 20 mg/dL (ref 6–23)
Calcium: 9.3 mg/dL (ref 8.4–10.5)
Creatinine, Ser: 0.9 mg/dL (ref 0.4–1.2)
GFR: 76.93 mL/min (ref >60.00)
Glucose, Bld: 84 mg/dL (ref 70–99)

## 2011-09-13 LAB — CBC WITH DIFFERENTIAL/PLATELET
Basophils Absolute: 0 10*3/uL (ref 0.0–0.1)
Eosinophils Relative: 0.7 % (ref 0.0–5.0)
HCT: 40.8 % (ref 36.0–46.0)
Lymphocytes Relative: 27.1 % (ref 12.0–46.0)
Lymphs Abs: 1.2 10*3/uL (ref 0.7–4.0)
Monocytes Relative: 7.9 % (ref 3.0–12.0)
Neutrophils Relative %: 63.8 % (ref 43.0–77.0)
Platelets: 133 10*3/uL — ABNORMAL LOW (ref 150.0–400.0)
RDW: 13 % (ref 11.5–14.6)
WBC: 4.5 10*3/uL (ref 4.5–10.5)

## 2011-09-13 LAB — PROTIME-INR
INR: 1 ratio (ref 0.8–1.0)
Prothrombin Time: 11.1 s (ref 10.2–12.4)

## 2011-09-15 MED ORDER — SODIUM CHLORIDE 0.9 % IR SOLN
80.0000 mg | Status: DC
  Filled 2011-09-15: qty 2

## 2011-09-15 MED ORDER — CEFAZOLIN SODIUM 1-5 GM-% IV SOLN: 1.0000 g | INTRAVENOUS | Status: DC

## 2011-09-16 ENCOUNTER — Ambulatory Visit (HOSPITAL_COMMUNITY)
Admission: RE | Admit: 2011-09-16 | Discharge: 2011-09-16 | Disposition: A | Discharge status: Home / Self Care | Payer: Medicare Other | Source: Ambulatory Visit | Attending: Internal Medicine | Admitting: Internal Medicine

## 2011-09-16 ENCOUNTER — Encounter (HOSPITAL_COMMUNITY)
Admission: RE | Disposition: A | Discharge status: Home / Self Care | Payer: Self-pay | Source: Ambulatory Visit | Attending: Internal Medicine

## 2011-09-16 DIAGNOSIS — I10 Essential (primary) hypertension: Secondary | ICD-10-CM

## 2011-09-16 DIAGNOSIS — K219 Gastro-esophageal reflux disease without esophagitis: Secondary | ICD-10-CM | POA: Insufficient documentation

## 2011-09-16 DIAGNOSIS — M810 Age-related osteoporosis without current pathological fracture: Secondary | ICD-10-CM | POA: Insufficient documentation

## 2011-09-16 DIAGNOSIS — Z950 Presence of cardiac pacemaker: Secondary | ICD-10-CM

## 2011-09-16 DIAGNOSIS — I442 Atrioventricular block, complete: Secondary | ICD-10-CM | POA: Insufficient documentation

## 2011-09-16 DIAGNOSIS — Z01810 Encounter for preprocedural cardiovascular examination: Secondary | ICD-10-CM

## 2011-09-16 DIAGNOSIS — F3289 Other specified depressive episodes: Secondary | ICD-10-CM | POA: Insufficient documentation

## 2011-09-16 DIAGNOSIS — R51 Headache: Secondary | ICD-10-CM

## 2011-09-16 DIAGNOSIS — E039 Hypothyroidism, unspecified: Secondary | ICD-10-CM | POA: Insufficient documentation

## 2011-09-16 DIAGNOSIS — Z45018 Encounter for adjustment and management of other part of cardiac pacemaker: Secondary | ICD-10-CM | POA: Insufficient documentation

## 2011-09-16 DIAGNOSIS — R0602 Shortness of breath: Secondary | ICD-10-CM

## 2011-09-16 DIAGNOSIS — I5189 Other ill-defined heart diseases: Secondary | ICD-10-CM

## 2011-09-16 DIAGNOSIS — F329 Major depressive disorder, single episode, unspecified: Secondary | ICD-10-CM | POA: Insufficient documentation

## 2011-09-16 HISTORY — PX: PACEMAKER GENERATOR CHANGE: SHX5481

## 2011-09-16 LAB — SURGICAL PCR SCREEN: Staphylococcus aureus: NEGATIVE

## 2011-09-16 SURGERY — PACEMAKER GENERATOR CHANGE
Anesthesia: LOCAL | Laterality: Left

## 2011-09-16 MED ORDER — DIAZEPAM 5 MG PO TABS: 5.0000 mg | ORAL_TABLET | Freq: Once | ORAL | Status: DC

## 2011-09-16 MED ORDER — CEFAZOLIN SODIUM 1-5 GM-% IV SOLN
INTRAVENOUS | Status: AC
  Filled 2011-09-16: qty 50

## 2011-09-16 MED ORDER — ACETAMINOPHEN 325 MG PO TABS: 325.0000 mg | ORAL_TABLET | ORAL | Status: DC | PRN

## 2011-09-16 MED ORDER — CHLORHEXIDINE GLUCONATE 4 % EX LIQD
60.0000 mL | Freq: Once | CUTANEOUS | Status: DC
  Filled 2011-09-16: qty 60

## 2011-09-16 MED ORDER — SODIUM CHLORIDE 0.9 % IV SOLN: INTRAVENOUS | Status: DC

## 2011-09-16 MED ORDER — MIDAZOLAM HCL 5 MG/5ML IJ SOLN
INTRAMUSCULAR | Status: AC
  Filled 2011-09-16: qty 5

## 2011-09-16 MED ORDER — LIDOCAINE HCL (PF) 1 % IJ SOLN
INTRAMUSCULAR | Status: AC
  Filled 2011-09-16: qty 60

## 2011-09-16 MED ORDER — ONDANSETRON HCL 4 MG/2ML IJ SOLN: 4.0000 mg | Freq: Four times a day (QID) | INTRAMUSCULAR | Status: DC | PRN

## 2011-09-16 MED ORDER — MUPIROCIN 2 % EX OINT
TOPICAL_OINTMENT | CUTANEOUS | Status: AC
  Administered 2011-09-16: 1 via NASAL
  Filled 2011-09-16: qty 22

## 2011-09-16 MED ORDER — SODIUM CHLORIDE 0.9 % IV SOLN
INTRAVENOUS | Status: DC
  Administered 2011-09-16 (×2): via INTRAVENOUS

## 2011-09-16 MED ORDER — FENTANYL CITRATE 0.05 MG/ML IJ SOLN
INTRAMUSCULAR | Status: AC
  Filled 2011-09-16: qty 2

## 2011-09-16 MED ORDER — MUPIROCIN 2 % EX OINT
TOPICAL_OINTMENT | Freq: Two times a day (BID) | CUTANEOUS | Status: DC
  Administered 2011-09-16: 1 via NASAL

## 2011-09-16 NOTE — Progress Notes (Signed)
Paged IV team for Iv start

## 2011-09-16 NOTE — Discharge Instructions (Signed)
Pacemaker Battery Change A pacemaker battery usually lasts 4 to 12 years. Once or twice per year, you will be asked to visit your caregiver to have a full evaluation of your pacemaker. When a battery needs to be replaced, the entire pacemaker is actually replaced so that you can benefit from new circuitry and any new features that have recently been added to pacemakers. Most often, this procedure is very simple because the leads are already in place. After giving medicine to numb the skin, your health care provider makes a cut to reopen the pocket holding the pacemaker and disconnects the old device from its leads. The leads are routinely tested at this time. If they are working okay, the new pacemaker may simply be connected to the existing leads. If there is any problem with the old lead system, it may be wise to replace the lead system while inserting the new pacemaker. There are many things that affect how long a pacemaker battery will last:  Age of the pacemaker.   Number of leads (1, 2 or 3).   Pacemaker work load. If the pacemaker is helping the heart more often, then the battery will not last as long as if the pacemaker does not need to help the heart.   Resistance of the leads. The greater the resistance, the greater the drain on the battery. This can increase as the leads get older or if one or more of the leads does not have the best contact with the heart.   Power (voltage) settings.   The health of the person's heart. If the health of the heart gets worse, then the pacemaker may have to work more often and the setting changed to accommodate these changes.  Your health care provider will be alerted to the fact that it is time to replace the battery during follow-up exams. He or she will check your pacemaker using a small table-top computer, called a programmer, and a wand. The wand is about the same size as a remote control. Your provider puts the wand on your body in the area where the  pacemaker is located. Information from the pacemaker is received about how well your heart is working and the status of the battery. It is not painful, and it usually takes just a few minutes. You will have plenty of time before the battery is fully used up to plan for replacement.  LET YOUR CAREGIVER KNOW ABOUT:   Symptoms of chest pain, trouble breathing, palpitations, lightheadedness, or feelings of an abnormal or irregular heart beat.   Allergies.   Medications taken including herbs, eye drops, over the counter medications, and creams   Use of steroids (by mouth or creams).   Possible pregnancy, if applicable.   Previous problems with anesthetics or Novocaine.   History of blood clots (thrombophlebitis).   History of bleeding or blood problems.   Surgery since your last pacemaker placement.   Other health problems.  RISKS AND COMPLICATIONS These are very uncommon but include:  Bleeding.   Bruising of the skin around where the incision was made.   Pain at the site of the incision.   Pulling apart of the skin at the incision site.   Infection.   Allergic reaction to anesthetics or medicines used during the procedure.  Diabetics may have a temporary increase in their blood sugar after any surgical procedure.  BEFORE THE PROCEDURE  Wash all of the skin around the area of the chest where the pacemaker is located.   Try to remove any loose, scaling skin. Unless advised otherwise, avoid using aspirin, ibuprofen, or naproxen for 3-4 days before the procedure. Ask your caregiver for help with any other medication adjustments before the pacemaker is replaced. Unless advised otherwise, do not eat or drink after midnight on the night before the procedure EXCEPT for drinking water and taking your medications as you normally would. AFTER THE PROCEDURE   A heart monitor and the pacemaker programmer will be used to make sure that the new pacemaker is working properly.   You can go home  after the procedure.   Your caregiver will advise you if you need to have any stitches. They will be removed 5-7 days after the procedure.  HOME CARE INSTRUCTIONS   Keep the incision clean and dry.   Unless advised otherwise, you may shower after carefully covering the incision with plastic wrap that is taped to your chest.   For the first week after the replacement, avoid stretching motions that pull at the incision site and avoid heavy exercise with the arm on the same side as the incision.   Only take over-the-counter or prescription medicines for pain, discomfort, or fever as directed by your caregiver.   Your caregiver will tell you when you will need to next test your pacemaker by telephone or when to return to the office for re-exam and/or removal of stitches, if necessary.  SEEK MEDICAL CARE IF:   You have unusual pain at the incision site that is not adequately helped by over-the-counter or prescription medicine.   There is drainage or pus from the incision site.   You develop red streaking that extends above or below the incision site.   You feel brief intermittent palpitations, lightheadedness or any symptoms that you feel might be related to your heart.  SEEK IMMEDIATE MEDICAL CARE IF:   You experience chest pain that is different than the pain at the incision site.   You experience:   Shortness of breath.   Palpitations.   Irregular heart beat.   Lightheadedness that does not go away quickly.   Fainting.   You develop a fever.   You have pain that gets worse even though you are taking pain medicine.  MAKE SURE YOU:   Understand these instructions.   Will watch your condition.   Will get help right away if you are not doing well or get worse.  Document Released: 09/25/2006 Document Revised: 06/06/2011 Document Reviewed: 12/29/2006 [employer] Patient Information 2012 [employer]. 

## 2011-09-16 NOTE — Interval H&P Note (Signed)
History and Physical Interval Note:  09/16/2011 9:26 AM  [name]  has presented today for surgery, with the diagnosis of EOL Battery  The various methods of treatment have been discussed with the patient and family. After consideration of risks, benefits and other options for treatment, the patient has consented to  Procedure(s) (LRB): PACEMAKER GENERATOR CHANGE (Left) as a surgical intervention .  The patients' history has been reviewed, patient examined, no change in status, stable for surgery.  I have reviewed the patients' chart and labs.  Questions were answered to the patient's satisfaction.     Sherryl Manges

## 2011-09-16 NOTE — H&P (View-Only) (Signed)
  HPI  [name] is a 71 y.o. 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 Heart block   . Diastolic dysfunction   . HTN (hypertension)   . Acute neck pain   . Tension headache   . Multinodular goiter   . SOB (shortness of breath)   . Contact dermatitis and eczema     unspec cause  . Tooth abscess   . Thyroid nodule   . Osteoporosis   . GERD (gastroesophageal reflux disease)   . Depression   . Lung nodule   . Superficial thrombophlebitis   . Hypothyroidism   . Acute sinusitis   . History of colonoscopy     Past Surgical History  Procedure Date  . Laporatomy for ruptured diverticulum with colostomy 2003  . Take down of colostomy 2004  . Exploratory laparotomy   . 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 Hernia repair     Current Outpatient Prescriptions  Medication Sig Dispense Refill  . amLODipine (NORVASC) 2.5 MG tablet TAKE ONE TABLET BY MOUTH EVERY DAY  30 tablet  10  . Chlorpheniramine-APAP (CORICIDIN) 2-325 MG TABS Take by mouth as needed.        . denosumab (PROLIA) 60 MG/ML SOLN Inject 60 mg into the skin once. One injection every 6 months       . famotidine (PEPCID) 20 MG tablet Take 20 mg by mouth as needed.       . ibuprofen (ADVIL,MOTRIN) 200 MG tablet Take 200 mg by mouth every 6 (six) hours as needed.        . levothyroxine (SYNTHROID, LEVOTHROID) 100 MCG tablet TAKE ONE TABLET BY MOUTH EVERY DAY  90 tablet  1  . lisinopril-hydrochlorothiazide (PRINZIDE,ZESTORETIC) 20-12.5 MG per tablet  Take 1 tablet by mouth daily.      . Multiple Vitamin (MULTIVITAMIN) capsule Take 1 capsule by mouth daily.         Current Facility-Administered Medications  Medication Dose Route Frequency Provider Last Rate Last Dose  . denosumab (PROLIA) injection 60 mg  60 mg Subcutaneous Once Michael E Norins, MD        Allergies  Allergen Reactions  . Codeine     REACTION: N/V    Review of Systems negative except from HPI and PMH  Physical Exam BP 122/68  Pulse 64  Ht 5' 5" (1.651 m)  Wt 141 lb 12.8 oz (64.32 kg)  BMI 23.60 kg/m2  SpO2 100% Well developed and nourished in no acute distress HENT normal Neck supple with JVP-flat  Cannon a waves Clear Regular rate and rhythm, no murmurs or gallops Abd-soft with active BS  Palpable mass No Clubbing cyanosis edema Skin-warm and dry A & Oriented  Grossly normal sensory and motor function   Assessment and  Plan  

## 2011-09-16 NOTE — Op Note (Signed)
Preoperative diagnosis CHB  Battery ERI Postoperative diagnosis same/  Chronically dislocated but functioning atrial lead  Procedure: Generator replacement    Following informed consent the patient was brought to the electrophysiology laboratory in place of the fluoroscopic table in the supine position after routine prep and drape lidocaine was infiltrated in the region of the previous incision and carried down to later the device pocket using sharp dissection and electrocautery. The pocket was opened the device was freed up and was explanted.  Interrogation of the previously implanted ventricular lead St Jude [id number]  demonstrated an R wave of NONE millivolts., and impedance of 615ohms, and a pacing threshold of 0.8 volts at 0.5 msec.    The previously implanted atrial lead 1642 T added P-wave amplitude of 4.3 illlivolts  and impedance of  498 ohms, and a pacing threshold of 0.6volts at 0.47milliseconds.  The leads were inspected. The leads were then attached to a Medtronic Adapta L pulse generator, serial number [id number].    The pocket was irrigated with antibiotic containing saline solution hemostasis was assured and the leads and the device were placed in the pocket. The wound is then closed in 3 layers in normal fashion.  The patient tolerated the procedure without apparent complication.  Sherryl Manges

## 2011-09-18 ENCOUNTER — Telehealth: Payer: Self-pay | Admitting: Internal Medicine

## 2011-09-18 NOTE — Telephone Encounter (Signed)
Spoke with the patient.  Area is red and painful, no blistering or broken skin.  She was advised to apply hydrocortisone cream(OTC) to area and call in the am if no better and we will have her come in the office.  Patient is in agreement.

## 2011-09-18 NOTE — Telephone Encounter (Signed)
Pt calling re pacermaker battery change on Monday, pt has a extremely red, itching, the exact size of the bandage where the adhesive was, very painful had trouble sleeping, what can she put on it?

## 2011-09-21 ENCOUNTER — Other Ambulatory Visit: Payer: Self-pay | Admitting: Internal Medicine

## 2011-09-30 ENCOUNTER — Ambulatory Visit (INDEPENDENT_AMBULATORY_CARE_PROVIDER_SITE_OTHER): Payer: Medicare Other | Admitting: *Deleted

## 2011-09-30 ENCOUNTER — Encounter: Payer: Self-pay | Admitting: Internal Medicine

## 2011-09-30 DIAGNOSIS — I442 Atrioventricular block, complete: Secondary | ICD-10-CM

## 2011-09-30 LAB — PACEMAKER DEVICE OBSERVATION
BAMS-0001: 155 {beats}/min
BATTERY VOLTAGE: 2.8 V
VENTRICULAR PACING PM: 100

## 2011-09-30 NOTE — Progress Notes (Signed)
Wound check-PPM 

## 2011-10-03 ENCOUNTER — Encounter: Payer: Medicare Other | Admitting: Internal Medicine

## 2011-10-09 ENCOUNTER — Ambulatory Visit (INDEPENDENT_AMBULATORY_CARE_PROVIDER_SITE_OTHER): Payer: Medicare Other | Admitting: *Deleted

## 2011-10-09 DIAGNOSIS — M810 Age-related osteoporosis without current pathological fracture: Secondary | ICD-10-CM

## 2011-10-09 MED ORDER — DENOSUMAB 60 MG/ML ~~LOC~~ SOLN
60.0000 mg | Freq: Once | SUBCUTANEOUS | Status: AC
  Administered 2011-10-09: 60 mg via SUBCUTANEOUS

## 2011-12-03 ENCOUNTER — Other Ambulatory Visit: Payer: Self-pay | Admitting: Internal Medicine

## 2011-12-03 ENCOUNTER — Ambulatory Visit: Payer: Medicare Other | Admitting: Internal Medicine

## 2012-02-05 ENCOUNTER — Other Ambulatory Visit: Payer: Self-pay | Admitting: Internal Medicine

## 2012-03-03 ENCOUNTER — Other Ambulatory Visit: Payer: Self-pay | Admitting: Internal Medicine

## 2012-03-04 ENCOUNTER — Telehealth: Payer: Self-pay | Admitting: Internal Medicine

## 2012-03-04 DIAGNOSIS — I10 Essential (primary) hypertension: Secondary | ICD-10-CM

## 2012-03-04 MED ORDER — LISINOPRIL-HYDROCHLOROTHIAZIDE 20-12.5 MG PO TABS: ORAL_TABLET | ORAL | Status: DC

## 2012-03-04 NOTE — Telephone Encounter (Signed)
Spoke w/pt in regards to burning and stabbing at pacer site. Pacer changed in March and still has pain. Per pt has redness at site. Pacer check moved from 9-30 to 03-09-12 @ 1130. Pt aware of change in appt.

## 2012-03-04 NOTE — Telephone Encounter (Signed)
I spoke with the patient. She states she is taking lisinopril/ hctz 20/12.5 mg two tablets by mouth once daily. Her prescription is listed in the chart twice as taking once daily, so only one got refilled. She also needs a #90 day supply on this. I advised I will fix the prescription in her chart and resend this to [facility] [address] in [facility]. The patient was also stating that since she has had her PPM generator change in March, she has had a stabbing pain/ burning sensation at the site. I advised I am not certain if the skin has adhered to the device. I attempted to contact the device clinic. There was no answer. I explained to the patient I will forward to Kristin/ [place] and ask that they call her to discuss. She is agreeable with this. She is due to come for a device check on 9/30 with device clinic.

## 2012-03-04 NOTE — Telephone Encounter (Signed)
I left a message for the patient to call. 

## 2012-03-04 NOTE — Telephone Encounter (Signed)
Please return call to patient [phone] regarding lisinopril dosage increase.

## 2012-03-09 ENCOUNTER — Ambulatory Visit (INDEPENDENT_AMBULATORY_CARE_PROVIDER_SITE_OTHER): Payer: Medicare Other | Admitting: *Deleted

## 2012-03-09 ENCOUNTER — Encounter: Payer: Self-pay | Admitting: Internal Medicine

## 2012-03-09 DIAGNOSIS — I442 Atrioventricular block, complete: Secondary | ICD-10-CM

## 2012-03-09 LAB — PACEMAKER DEVICE OBSERVATION
AL AMPLITUDE: 4 mv
AL IMPEDENCE PM: 541 Ohm
AL THRESHOLD: 0.75 V
RV LEAD THRESHOLD: 1 V

## 2012-03-09 NOTE — Progress Notes (Signed)
Pacer check

## 2012-03-10 ENCOUNTER — Ambulatory Visit (INDEPENDENT_AMBULATORY_CARE_PROVIDER_SITE_OTHER): Payer: Medicare Other | Admitting: Internal Medicine

## 2012-03-10 ENCOUNTER — Other Ambulatory Visit (INDEPENDENT_AMBULATORY_CARE_PROVIDER_SITE_OTHER): Payer: Medicare Other

## 2012-03-10 ENCOUNTER — Encounter: Payer: Self-pay | Admitting: Internal Medicine

## 2012-03-10 ENCOUNTER — Other Ambulatory Visit: Payer: Self-pay | Admitting: *Deleted

## 2012-03-10 VITALS — BP 140/80 | HR 84 | Temp 97.0°F | Resp 16 | Wt 143.0 lb

## 2012-03-10 DIAGNOSIS — J984 Other disorders of lung: Secondary | ICD-10-CM

## 2012-03-10 DIAGNOSIS — E042 Nontoxic multinodular goiter: Secondary | ICD-10-CM

## 2012-03-10 DIAGNOSIS — Z862 Personal history of diseases of the blood and blood-forming organs and certain disorders involving the immune mechanism: Secondary | ICD-10-CM

## 2012-03-10 DIAGNOSIS — Z8639 Personal history of other endocrine, nutritional and metabolic disease: Secondary | ICD-10-CM

## 2012-03-10 DIAGNOSIS — I519 Heart disease, unspecified: Secondary | ICD-10-CM

## 2012-03-10 LAB — TSH: TSH: 0.47 u[IU]/mL (ref 0.35–5.50)

## 2012-03-10 NOTE — Assessment & Plan Note (Signed)
Perception of enlarging left neck with known multinodular goiter and dense nodule in the left lobe = 6 cm. She is also reporting decreased energy. No fevers, no tachycardia.  Plan - f/u thyroid U/S  Lab: TSH, T4, FT4

## 2012-03-10 NOTE — Patient Instructions (Addendum)
1. Shortness of breath with exertion - may be thyroid, may be heart related, very unlikely to be lung related. Plan - thyroid labs  CT chest   2 D echo of heart - last study in 2009 revealed an ejection fraction (juice per squeeze) of 50-55%, normal being 60-65%  2. Goiter - last study Sept '12 with no change from previous study. Plan Repeat thyroid u/s to assess goiter  3. Flu shot.

## 2012-03-10 NOTE — Assessment & Plan Note (Signed)
Patient with increased DOE but no chest pain. Has history of diminished EF  Plan F/U 2 D echo.

## 2012-03-10 NOTE — Assessment & Plan Note (Signed)
Patient with DOE. No weight loss.  Plan F/u CT chest w/o contrast.

## 2012-03-10 NOTE — Assessment & Plan Note (Signed)
BP Readings from Last 3 Encounters:  03/10/12 140/80  09/16/11 130/64  09/16/11 130/64   Good control on present medical regimen.

## 2012-03-10 NOTE — Progress Notes (Signed)
Subjective:    Patient ID: [name], female    DOB: [birth year], 71 y.o.   MRN: [id number]  HPI Ms. Blau presents for evaluation of enlarging goiter, especially left neck. Last U/S soft tissue neck was Sept '12- no change from previous study.  She also reports increased DOE and weakness. Last Thyroid labs with low TSH but normal FT4 and T4. She has a h/o lung nodules - last study '07 revealed 3 nodules that had been stable for 3 years. She has no cough, no chest pain. Last 2 D echo in '09 with low EF = 50-55% with normal wall motion. She denies any chest pain with exertion or rest.  Past Medical History  Diagnosis Date  . Heart block   . Diastolic dysfunction   . HTN (hypertension)   . Acute neck pain   . Tension headache   . Multinodular goiter   . SOB (shortness of breath)   . Contact dermatitis and eczema     unspec cause  . Tooth abscess   . Thyroid nodule   . Osteoporosis   . GERD (gastroesophageal reflux disease)   . Depression   . Lung nodule   . Superficial thrombophlebitis   . Hypothyroidism   . Acute sinusitis   . History of colonoscopy    Past Surgical History  Procedure Date  . Laporatomy for ruptured diverticulum with colostomy 12/04/2001  . Take down of colostomy 2002/12/05  . Exploratory laparotomy   . 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 Hernia repair    Family History  Problem Relation Age of Onset  . Heart disease Mother   . Heart failure Father   . Diverticulosis Sister   . Hypertension Brother   . Hypertension Brother    History   Social History  . Marital Status: Single    Spouse Name: N/A    Number of Children: 2  . Years of Education: 16   Occupational History  . 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     Social History Main Topics  . Smoking status: Former Games developer  . Smokeless tobacco: Never Used   Comment: quit 30 years ago  . Alcohol Use: Yes  . Drug Use: No  . Sexually Active: No   Other Topics Concern  . Not on file     Social History Narrative   The patient is a Buyer, retail of [employer].  She worked for the [employer] in [employer].  She was married for 22 years, divorced, and has remained single. She has 1 grown son, 1 grown daughter.  She has 3 grandchildren, 2 of her childrenlive in [place].  She is retired.  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    Current Outpatient Prescriptions on File Prior to Visit  Medication Sig Dispense Refill  . amLODipine (NORVASC) 2.5 MG tablet TAKE ONE TABLET BY MOUTH EVERY DAY  30 tablet  5  . Chlorpheniramine-APAP (CORICIDIN) 2-325 MG TABS Take 2 tablets by mouth daily as needed. For allergies      . denosumab (PROLIA) 60 MG/ML SOLN Inject 60 mg into the skin every 6 (six) months. One injection every 6 months Last dose was in October, Next dose due in April      . ibuprofen (ADVIL,MOTRIN) 200 MG tablet Take 400 mg by mouth every 6 (six) hours as needed. For headache/pain      .  levothyroxine (SYNTHROID, LEVOTHROID) 100 MCG tablet TAKE ONE TABLET BY MOUTH EVERY DAY  30 tablet  5  . lisinopril-hydrochlorothiazide (PRINZIDE,ZESTORETIC) 20-12.5 MG per tablet Take two tablets by mouth once daily  180 tablet  3  . Multiple Vitamin (MULTIVITAMIN) capsule Take 1 capsule by mouth daily.       [name] omeprazole (PRILOSEC) 20 MG capsule Take 20 mg by mouth as needed.       [name] OVER THE COUNTER MEDICATION Take 1 tablet by mouth daily. Airborne tablets      . DISCONTD: amLODipine (NORVASC) 2.5 MG tablet Take 2.5 mg by mouth daily.      [name] DISCONTD: famotidine (PEPCID) 20 MG tablet Take 20 mg by mouth as needed.       [name] DISCONTD: levothyroxine (SYNTHROID, LEVOTHROID) 100 MCG tablet Take 100 mcg by mouth daily.       Current Facility-Administered Medications on File Prior to Visit  Medication Dose Route Frequency Provider Last Rate Last Dose  . denosumab (PROLIA) injection 60 mg  60  mg Subcutaneous Once Jacques Navy, MD          Review of Systems System review is negative for any constitutional, cardiac, pulmonary, GI or neuro symptoms or complaints other than as described in the HPI.     Objective:   Physical Exam  Filed Vitals:   03/10/12 1545  BP: 140/80  Pulse: 84  Temp: 97 F (36.1 C)  Resp: 16   Wt Readings from Last 3 Encounters:  03/10/12 143 lb (64.864 kg)  09/16/11 140 lb (63.504 kg)  09/16/11 140 lb (63.504 kg)   Gen'l- WNWD AA woman who looks younger than her stated age in no distress HEENT- C&S clear Neck - visibly prominent left neck, palpable thyroid gland that is smooth without nodularity or tenderness Cor- RRR Pulm - normal respirations, no rales or wheeze, no increased WOB Neuro - A&O x 3, DTRs biceps and radial tendon 2+ normal Derm- normal skin turgor.      Assessment & Plan:

## 2012-03-11 ENCOUNTER — Other Ambulatory Visit: Payer: Self-pay | Admitting: Internal Medicine

## 2012-03-11 ENCOUNTER — Telehealth: Payer: Self-pay | Admitting: *Deleted

## 2012-03-11 DIAGNOSIS — E042 Nontoxic multinodular goiter: Secondary | ICD-10-CM

## 2012-03-11 NOTE — Telephone Encounter (Signed)
sherry from [facility] CALLED ON PATIENT. ORDER FOR U/S OF SOFT TISSUE HEAD /NECK  ; DIAGNOSIS NEEDS TO READ GOITER OR THYROID NODULE  FOR INSURANCE  REASONS.  PATIENT IS CALLING TO GET THIS DONE AND SCHEDULED.

## 2012-03-15 ENCOUNTER — Encounter: Payer: Self-pay | Admitting: Internal Medicine

## 2012-03-16 ENCOUNTER — Ambulatory Visit (HOSPITAL_COMMUNITY): Payer: Medicare Other | Attending: Internal Medicine | Admitting: Radiology

## 2012-03-16 ENCOUNTER — Ambulatory Visit
Admission: RE | Admit: 2012-03-16 | Discharge: 2012-03-16 | Disposition: A | Discharge status: Home / Self Care | Payer: Medicare Other | Source: Ambulatory Visit | Attending: Internal Medicine | Admitting: Internal Medicine

## 2012-03-16 ENCOUNTER — Ambulatory Visit (INDEPENDENT_AMBULATORY_CARE_PROVIDER_SITE_OTHER)
Admission: RE | Admit: 2012-03-16 | Discharge: 2012-03-16 | Disposition: A | Discharge status: Home / Self Care | Payer: Medicare Other | Source: Ambulatory Visit | Attending: Internal Medicine | Admitting: Internal Medicine

## 2012-03-16 DIAGNOSIS — E042 Nontoxic multinodular goiter: Secondary | ICD-10-CM

## 2012-03-16 DIAGNOSIS — R0989 Other specified symptoms and signs involving the circulatory and respiratory systems: Secondary | ICD-10-CM | POA: Insufficient documentation

## 2012-03-16 DIAGNOSIS — I059 Rheumatic mitral valve disease, unspecified: Secondary | ICD-10-CM | POA: Insufficient documentation

## 2012-03-16 DIAGNOSIS — I519 Heart disease, unspecified: Secondary | ICD-10-CM

## 2012-03-16 DIAGNOSIS — I369 Nonrheumatic tricuspid valve disorder, unspecified: Secondary | ICD-10-CM | POA: Insufficient documentation

## 2012-03-16 DIAGNOSIS — I442 Atrioventricular block, complete: Secondary | ICD-10-CM

## 2012-03-16 DIAGNOSIS — I379 Nonrheumatic pulmonary valve disorder, unspecified: Secondary | ICD-10-CM | POA: Insufficient documentation

## 2012-03-16 DIAGNOSIS — R0609 Other forms of dyspnea: Secondary | ICD-10-CM | POA: Insufficient documentation

## 2012-03-16 DIAGNOSIS — I10 Essential (primary) hypertension: Secondary | ICD-10-CM | POA: Insufficient documentation

## 2012-03-16 DIAGNOSIS — J984 Other disorders of lung: Secondary | ICD-10-CM

## 2012-03-16 NOTE — Progress Notes (Signed)
Echocardiogram performed.  

## 2012-03-23 ENCOUNTER — Encounter: Payer: Self-pay | Admitting: Internal Medicine

## 2012-04-20 ENCOUNTER — Ambulatory Visit (INDEPENDENT_AMBULATORY_CARE_PROVIDER_SITE_OTHER): Payer: Medicare Other | Admitting: Internal Medicine

## 2012-04-20 ENCOUNTER — Encounter: Payer: Self-pay | Admitting: Internal Medicine

## 2012-04-20 VITALS — BP 142/80 | HR 89 | Temp 97.5°F | Resp 16 | Wt 140.0 lb

## 2012-04-20 DIAGNOSIS — J984 Other disorders of lung: Secondary | ICD-10-CM

## 2012-04-20 DIAGNOSIS — IMO0002 Reserved for concepts with insufficient information to code with codable children: Secondary | ICD-10-CM

## 2012-04-20 DIAGNOSIS — M810 Age-related osteoporosis without current pathological fracture: Secondary | ICD-10-CM

## 2012-04-20 DIAGNOSIS — E042 Nontoxic multinodular goiter: Secondary | ICD-10-CM

## 2012-04-20 MED ORDER — DENOSUMAB 60 MG/ML ~~LOC~~ SOLN
60.0000 mg | Freq: Once | SUBCUTANEOUS | Status: AC
  Administered 2012-04-20: 60 mg via SUBCUTANEOUS

## 2012-04-20 NOTE — Progress Notes (Signed)
Subjective:    Patient ID: [name], female    DOB: 10/12/1940, 71 y.o.   MRN: [id number]  HPI [name] has had a bout of sinus congestion and otalgia but reports that this is getting better. She had been taking some otc sudafed at low dose.   She recently had reevaluation of multi-nodular goiter: CT report and U/S neck report reviewed with her. The u/s reveals enlargement of the left thyroid mass thought to be goiter but radiology recommended needle bx if not already done. The patient recalls having had needle biopsy in [place] prior to moving to [place] but reports are not available.  She is due for prolia injection and has been preauthorized by insurance.   Past Medical History  Diagnosis Date  . Heart block   . Diastolic dysfunction   . HTN (hypertension)   . Acute neck pain   . Tension headache   . Multinodular goiter   . SOB (shortness of breath)   . Contact dermatitis and eczema     unspec cause  . Tooth abscess   . Thyroid nodule   . Osteoporosis   . GERD (gastroesophageal reflux disease)   . Depression   . Lung nodule   . Superficial thrombophlebitis   . Hypothyroidism   . Acute sinusitis   . History of colonoscopy    Past Surgical History  Procedure Date  . Laporatomy for ruptured diverticulum with colostomy 2001-12-26  . Take down of colostomy December 27, 2002  . Exploratory laparotomy   . 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 Hernia repair    Family History  Problem Relation Age of Onset  . Heart disease Mother   . Heart failure Father   . Diverticulosis Sister   . Hypertension Brother   . Hypertension Brother    History   Social History  . Marital Status: Single    Spouse Name: N/A    Number of Children: 2  . Years of Education: 16   Occupational History  . 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     Social History Main Topics  . Smoking status: Former Games developer  . Smokeless tobacco: Never Used   Comment: quit 30 years ago  . Alcohol Use: Yes  . Drug  Use: No  . Sexually Active: No   Other Topics Concern  . Not on file   Social History Narrative   The patient is a Buyer, retail of [employer].  She worked for the [employer] in [employer].  She was married for 22 years, divorced, and has remained single. She has 1 grown son, 1 grown daughter.  She has 3 grandchildren, 2 of her childrenlive in [place].  She is retired.  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    Current Outpatient Prescriptions on File Prior to Visit  Medication Sig Dispense Refill  . amLODipine (NORVASC) 2.5 MG tablet TAKE ONE TABLET BY MOUTH EVERY DAY  30 tablet  5  . Chlorpheniramine-APAP (CORICIDIN) 2-325 MG TABS Take 2 tablets by mouth daily as needed. For allergies      . denosumab (PROLIA) 60 MG/ML SOLN Inject 60 mg into the skin every 6 (six) months. One injection every 6 months Last dose was in October, Next dose due in April      . ibuprofen (ADVIL,MOTRIN) 200 MG tablet Take 400 mg by mouth every 6 (six)  hours as needed. For headache/pain      . levothyroxine (SYNTHROID, LEVOTHROID) 100 MCG tablet TAKE ONE TABLET BY MOUTH EVERY DAY  30 tablet  5  . lisinopril-hydrochlorothiazide (PRINZIDE,ZESTORETIC) 20-12.5 MG per tablet Take two tablets by mouth once daily  180 tablet  3  . Multiple Vitamin (MULTIVITAMIN) capsule Take 1 capsule by mouth daily.       [name] omeprazole (PRILOSEC) 20 MG capsule Take 20 mg by mouth as needed.       [name] OVER THE COUNTER MEDICATION Take 1 tablet by mouth daily. Airborne tablets      . DISCONTD: famotidine (PEPCID) 20 MG tablet Take 20 mg by mouth as needed.        Current Facility-Administered Medications on File Prior to Visit  Medication Dose Route Frequency Provider Last Rate Last Dose  . denosumab (PROLIA) injection 60 mg  60 mg Subcutaneous Once Jacques Navy, MD         Review of Systems System review is negative for any  constitutional, cardiac, pulmonary, GI or neuro symptoms or complaints other than as described in the HPI.     Objective:   Physical Exam Filed Vitals:   04/20/12 1032  BP: 142/80  Pulse: 89  Temp: 97.5 F (36.4 C)  Resp: 16   Wt Readings from Last 3 Encounters:  04/20/12 140 lb (63.504 kg)  03/10/12 143 lb (64.864 kg)  09/16/11 140 lb (63.504 kg)   Gen'l- WNWD woman looking younger than her stated age in no distress HEENT- C&S clear, PERRLA, no tenderness to percussion over frontal and maxillary sinus. EAC/TM right normal Neck - large left neck mass c/w thyroid nodule Cor- RRR Pulm - CTAP Neuor - A&O x 3, CN II-XII normal, normal gait.      Assessment & Plan:  Sinus congestion with eustachian tube dysfunction. Plan- sudafed 30 mg bid or tid.

## 2012-04-20 NOTE — Assessment & Plan Note (Signed)
Patient with enlarging thyroid nodule. This has not been biopsied since being in [place]  Plan  Referral for U/S guided needle bx.

## 2012-04-20 NOTE — Assessment & Plan Note (Signed)
Patient with osteoporosis due for Prolia injection today.  Plan -  injection of prolia.  Referral for DEXA - routine monitoring of osteoporosis with last study in '10

## 2012-04-21 ENCOUNTER — Telehealth: Payer: Self-pay | Admitting: *Deleted

## 2012-04-21 NOTE — Telephone Encounter (Signed)
Patient called concern of note of CT of Chest results that noted any procedures she has done she should receive prophylactic antibiotics  before procedures. Concern of this when Thyroid Biopsy is done should she receive antibiotics. CB# [phone].

## 2012-04-21 NOTE — Assessment & Plan Note (Signed)
IMPRESSION:  1. No change in numerous small subpleural nodules scattered  throughout the lungs bilaterally compared to prior examinations.  Given their stability in size over greater than a 6-year time  interval, these can be considered radiographically benign requiring  no further imaging follow-up.  2. New area of sclerosis in the anterolateral aspect of the right  seventh rib is nonspecific. If the patient has a history of trauma  to this area, this may represent a healed fracture. Alternatively,  if the patient has a documented history of primary malignancy, the  possibility of a metastatic lesion is difficult to exclude.  Clinical correlation is recommended, with consideration for further  evaluation with bone scan if indicated.  3. Multiple ill-defined low attenuation lesions in the spleen  appear unchanged in retrospect compared to prior examinations, and  although nonspecific in appearance, their is stability over time  would suggest benign etiologies.  4. Small hiatal hernia.  5. Persistent mass-like enlargement of the left lobe of the thyroid  gland is very similar to prior examinations. Given stability in  size over time, this likely represents an asymmetric goiter.  6. Additional incidental findings, as above.  Stable changes.

## 2012-04-22 NOTE — Telephone Encounter (Signed)
Left message on patient home # of no need for prophylactic antibiotics for her Thyroid Biopsy per Dr. Debby Bud.

## 2012-04-22 NOTE — Telephone Encounter (Signed)
Should not require antibiotics for this - very, very unlikely to induce blood born infection

## 2012-05-04 ENCOUNTER — Other Ambulatory Visit: Payer: Medicare Other

## 2012-05-05 ENCOUNTER — Ambulatory Visit (INDEPENDENT_AMBULATORY_CARE_PROVIDER_SITE_OTHER): Payer: Medicare Other | Admitting: Internal Medicine

## 2012-05-05 ENCOUNTER — Encounter: Payer: Self-pay | Admitting: Internal Medicine

## 2012-05-05 VITALS — BP 132/80 | HR 83 | Temp 97.1°F | Resp 14 | Ht 65.0 in | Wt 142.2 lb

## 2012-05-05 DIAGNOSIS — J0100 Acute maxillary sinusitis, unspecified: Secondary | ICD-10-CM

## 2012-05-05 MED ORDER — TRAMADOL HCL 50 MG PO TABS: 50.0000 mg | ORAL_TABLET | Freq: Three times a day (TID) | ORAL | Status: DC | PRN

## 2012-05-05 MED ORDER — AMOXICILLIN-POT CLAVULANATE 875-125 MG PO TABS: 1.0000 | ORAL_TABLET | Freq: Two times a day (BID) | ORAL | Status: DC

## 2012-05-05 NOTE — Patient Instructions (Addendum)
Sinusitis - may be viral vs bacterial given the amount of discomfort.  Plan Augmentin 875 mg twice a day  Loratadine 10 mg once a day to dry the drip  Robitussin DM (generic equivalent is OK)  Low dose sudafed - 30 mg 2 or 3 times a day  Hydrate, Vitamin C 1500 mg daily, ecchinacea - can be very helpful to the immune system.  vaporizor - stove top: bring water to boil and turn off, drop in a dollop methalatum or vicks and make a small tent with a towel and inhale.  Tramadol for back pain - 50 mg three times a day.   Rest   Sinusitis Sinusitis is redness, soreness, and swelling (inflammation) of the paranasal sinuses. Paranasal sinuses are air pockets within the bones of your face (beneath the eyes, the middle of the forehead, or above the eyes). In healthy paranasal sinuses, mucus is able to drain out, and air is able to circulate through them by way of your nose. However, when your paranasal sinuses are inflamed, mucus and air can become trapped. This can allow bacteria and other germs to grow and cause infection. Sinusitis can develop quickly and last only a short time (acute) or continue over a long period (chronic). Sinusitis that lasts for more than 12 weeks is considered chronic.   CAUSES   Causes of sinusitis include:  Allergies.   Structural abnormalities, such as displacement of the cartilage that separates your nostrils (deviated septum), which can decrease the air flow through your nose and sinuses and affect sinus drainage.   Functional abnormalities, such as when the small hairs (cilia) that line your sinuses and help remove mucus do not work properly or are not present.  SYMPTOMS   Symptoms of acute and chronic sinusitis are the same. The primary symptoms are pain and pressure around the affected sinuses. Other symptoms include:  Upper toothache.   Earache.   Headache.   Bad breath.   Decreased sense of smell and taste.   A cough, which worsens when you are lying  flat.   Fatigue.   Fever.   Thick drainage from your nose, which often is green and may contain pus (purulent).   Swelling and warmth over the affected sinuses.  DIAGNOSIS   Your caregiver will perform a physical exam. During the exam, your caregiver may:  Look in your nose for signs of abnormal growths in your nostrils (nasal polyps).   Tap over the affected sinus to check for signs of infection.   View the inside of your sinuses (endoscopy) with a special imaging device with a light attached (endoscope), which is inserted into your sinuses.  If your caregiver suspects that you have chronic sinusitis, one or more of the following tests may be recommended:  Allergy tests.   Nasal culture A sample of mucus is taken from your nose and sent to a lab and screened for bacteria.   Nasal cytology A sample of mucus is taken from your nose and examined by your caregiver to determine if your sinusitis is related to an allergy.  TREATMENT   Most cases of acute sinusitis are related to a viral infection and will resolve on their own within 10 days. Sometimes medicines are prescribed to help relieve symptoms (pain medicine, decongestants, nasal steroid sprays, or saline sprays).   However, for sinusitis related to a bacterial infection, your caregiver will prescribe antibiotic medicines. These are medicines that will help kill the bacteria causing the infection.  Rarely, sinusitis is caused by a fungal infection. In theses cases, your caregiver will prescribe antifungal medicine. For some cases of chronic sinusitis, surgery is needed. Generally, these are cases in which sinusitis recurs more than 3 times per year, despite other treatments. HOME CARE INSTRUCTIONS    Drink plenty of water. Water helps thin the mucus so your sinuses can drain more easily.   Use a humidifier.   Inhale steam 3 to 4 times a day (for example, sit in the bathroom with the shower running).   Apply a warm, moist  washcloth to your face 3 to 4 times a day, or as directed by your caregiver.   Use saline nasal sprays to help moisten and clean your sinuses.   Take over-the-counter or prescription medicines for pain, discomfort, or fever only as directed by your caregiver.  SEEK IMMEDIATE MEDICAL CARE IF:  You have increasing pain or severe headaches.   You have nausea, vomiting, or drowsiness.   You have swelling around your face.   You have vision problems.   You have a stiff neck.   You have difficulty breathing.  MAKE SURE YOU:    Understand these instructions.   Will watch your condition.   Will get help right away if you are not doing well or get worse.  Document Released: 06/17/2005 Document Revised: 09/09/2011 Document Reviewed: 07/02/2011 [facility] Patient Information 2013 [place], [place].

## 2012-05-05 NOTE — Progress Notes (Signed)
  Subjective:    Patient ID: [name], female    DOB: Oct 01, 1940, 71 y.o.   MRN: [id number]  HPI Mrs. Pebley has a 10 day h/o rhinorrhea that is thick and opaque, no blood, sinus pressure and discomfort, persistent cough that is pruductive of phlegm that may be post-nasal drip. She has been short of breath. No fever, no rigors. She has had diffuse myalgias. She has not taken any otc medications.   PMH, FamHx and SocHx reviewed for any changes and relevance. Current Outpatient Prescriptions on File Prior to Visit  Medication Sig Dispense Refill  . amLODipine (NORVASC) 2.5 MG tablet TAKE ONE TABLET BY MOUTH EVERY DAY  30 tablet  5  . Chlorpheniramine-APAP (CORICIDIN) 2-325 MG TABS Take 2 tablets by mouth daily as needed. For allergies      . denosumab (PROLIA) 60 MG/ML SOLN Inject 60 mg into the skin every 6 (six) months. One injection every 6 months Last dose was in October, Next dose due in April      . ibuprofen (ADVIL,MOTRIN) 200 MG tablet Take 400 mg by mouth every 6 (six) hours as needed. For headache/pain      . levothyroxine (SYNTHROID, LEVOTHROID) 100 MCG tablet TAKE ONE TABLET BY MOUTH EVERY DAY  30 tablet  5  . lisinopril-hydrochlorothiazide (PRINZIDE,ZESTORETIC) 20-12.5 MG per tablet Take two tablets by mouth once daily  180 tablet  3  . Multiple Vitamin (MULTIVITAMIN) capsule Take 1 capsule by mouth daily.       [name] omeprazole (PRILOSEC) 20 MG capsule Take 20 mg by mouth as needed.       [name] OVER THE COUNTER MEDICATION Take 1 tablet by mouth daily. Airborne tablets      . [DISCONTINUED] famotidine (PEPCID) 20 MG tablet Take 20 mg by mouth as needed.           Review of Systems System review is negative for any constitutional, cardiac, pulmonary, GI or neuro symptoms or complaints other than as described in the HPI.     Objective:   Physical Exam Filed Vitals:   05/05/12 1126  BP: 132/80  Pulse: 83  Temp: 97.1 F (36.2 C)  Resp: 14   gen'l- WNWD AA woman in no  distress HEENT - tender to percussion over the left maxillary sinus, EAC/TMs normal Nodes - negative submandibular and cervical chains Chest - normal respirations, CTAP Neuro- A&O x 3.        Assessment & Plan:  Sinusitis - may be viral vs bacterial given the amount of discomfort.  Plan Augmentin 875 mg twice a day  Robitussin DM (generic equivalent is OK)  Low dose sudafed - 30 mg 2 or 3 times a day

## 2012-05-30 ENCOUNTER — Other Ambulatory Visit: Payer: Self-pay | Admitting: Internal Medicine

## 2012-06-04 ENCOUNTER — Other Ambulatory Visit (HOSPITAL_COMMUNITY)
Admission: RE | Admit: 2012-06-04 | Discharge: 2012-06-04 | Disposition: A | Discharge status: Home / Self Care | Payer: Medicare Other | Source: Ambulatory Visit | Attending: Interventional Radiology | Admitting: Interventional Radiology

## 2012-06-04 ENCOUNTER — Ambulatory Visit
Admission: RE | Admit: 2012-06-04 | Discharge: 2012-06-04 | Disposition: A | Discharge status: Home / Self Care | Payer: Medicare Other | Source: Ambulatory Visit | Attending: Internal Medicine | Admitting: Internal Medicine

## 2012-06-04 ENCOUNTER — Ambulatory Visit (INDEPENDENT_AMBULATORY_CARE_PROVIDER_SITE_OTHER)
Admission: RE | Admit: 2012-06-04 | Discharge: 2012-06-04 | Disposition: A | Discharge status: Home / Self Care | Payer: Medicare Other | Source: Ambulatory Visit

## 2012-06-04 DIAGNOSIS — M810 Age-related osteoporosis without current pathological fracture: Secondary | ICD-10-CM

## 2012-06-04 DIAGNOSIS — E042 Nontoxic multinodular goiter: Secondary | ICD-10-CM

## 2012-06-04 DIAGNOSIS — E049 Nontoxic goiter, unspecified: Secondary | ICD-10-CM | POA: Insufficient documentation

## 2012-06-18 ENCOUNTER — Ambulatory Visit (INDEPENDENT_AMBULATORY_CARE_PROVIDER_SITE_OTHER): Payer: Medicare Other | Admitting: Internal Medicine

## 2012-06-18 ENCOUNTER — Telehealth: Payer: Self-pay | Admitting: Internal Medicine

## 2012-06-18 ENCOUNTER — Encounter: Payer: Self-pay | Admitting: Internal Medicine

## 2012-06-18 VITALS — BP 112/84 | HR 76 | Temp 97.0°F | Resp 10 | Wt 144.0 lb

## 2012-06-18 DIAGNOSIS — E041 Nontoxic single thyroid nodule: Secondary | ICD-10-CM

## 2012-06-18 DIAGNOSIS — E042 Nontoxic multinodular goiter: Secondary | ICD-10-CM

## 2012-06-18 NOTE — Patient Instructions (Addendum)
Thyroid goiter - follow up study Sept '13 revealed a small increase in size of the solid nodule in the left lobe of the thyroid gland. Due to the increase in size a needle biopsy was recommended. This revealed a cellular appearance that was not entirely normal but not abnormal to the point of being able to make a definitive diagnosis of cancer, "cannot entirely rule our thyroid neoplasm."  Plan  Referral to Dr. Todd Gerkin, general surgeon at [facility], for excisional biopsy for a definitive diagnosis.   This may take several weeks to get done but there is no evidence of a rapidly changing or dangerous lesion.  Only worry some, not a lot!! 

## 2012-06-18 NOTE — Telephone Encounter (Signed)
Message copied by Newell Coral on Thu Jun 18, 2012  8:20 AM ------      Message from: Carin Primrose      Created: Thu Jun 18, 2012  8:05 AM      Regarding: FW: OV       Can you add her on for today or tomorrow per Dr. Debby Bud? Thanks.       ----- Message -----         From: Jacques Navy, MD         Sent: 06/17/2012   6:02 PM           To: Carin Primrose, CMA      Subject: RE: OV                                                   May add on tomorrow or Friday      ----- Message -----         From: Carin Primrose, CMA         Sent: 06/17/2012   4:59 PM           To: Jacques Navy, MD      Subject: OV                                                       Pt has office visit schedule with you on 07/07/2012 to discuss results of thyroid biopsy-she wanted to know if there was a cancellation if she could be worked in sooner.

## 2012-06-18 NOTE — Telephone Encounter (Signed)
Tried calling patient, no answer , will try back soon

## 2012-06-23 NOTE — Progress Notes (Signed)
Subjective:    Patient ID: [name], female    DOB: 1940-07-30, 71 y.o.   MRN: [id number]  HPI Mrs. Riden presents to review recent needle biopsy of thyroid lesion. She has a long history of goiter. Recent u/s confirmed growth of dominant left thyroid nodule. Needle bx was recommended. Results were equivocal . She has been feeling OK - but has been very worried since receiving word on the path report.  Past Medical History  Diagnosis Date  . Heart block   . Diastolic dysfunction   . HTN (hypertension)   . Acute neck pain   . Tension headache   . Multinodular goiter   . SOB (shortness of breath)   . Contact dermatitis and eczema     unspec cause  . Tooth abscess   . Thyroid nodule   . Osteoporosis   . GERD (gastroesophageal reflux disease)   . Depression   . Lung nodule   . Superficial thrombophlebitis   . Hypothyroidism   . Acute sinusitis   . History of colonoscopy    Past Surgical History  Procedure Date  . Laporatomy for ruptured diverticulum with colostomy 11-18-01  . Take down of colostomy 11-19-2002  . Exploratory laparotomy   . 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 Hernia repair    Family History  Problem Relation Age of Onset  . Heart disease Mother   . Heart failure Father   . Diverticulosis Sister   . Hypertension Brother   . Hypertension Brother    History   Social History  . Marital Status: Single    Spouse Name: N/A    Number of Children: 2  . Years of Education: 16   Occupational History  . 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     Social History Main Topics  . Smoking status: Former Games developer  . Smokeless tobacco: Never Used     Comment: quit 30 years ago  . Alcohol Use: Yes  . Drug Use: No  . Sexually Active: No   Other Topics Concern  . Not on file   Social History Narrative   The patient is a Buyer, retail of [employer].  She worked for the [employer] in [employer].  She was married for 22 years,  divorced, and has remained single. She has 1 grown son, 1 grown daughter.  She has 3 grandchildren, 2 of her childrenlive in [place].  She is retired.  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    Current Outpatient Prescriptions on File Prior to Visit  Medication Sig Dispense Refill  . amLODipine (NORVASC) 2.5 MG tablet TAKE ONE TABLET BY MOUTH EVERY DAY  30 tablet  5  . Chlorpheniramine-APAP (CORICIDIN) 2-325 MG TABS Take 2 tablets by mouth daily as needed. For allergies      . denosumab (PROLIA) 60 MG/ML SOLN Inject 60 mg into the skin every 6 (six) months. One injection every 6 months Last dose was in October, Next dose due in April      . ibuprofen (ADVIL,MOTRIN) 200 MG tablet Take 400 mg by mouth every 6 (six) hours as needed. For headache/pain      . levothyroxine (SYNTHROID, LEVOTHROID) 100 MCG tablet TAKE ONE TABLET BY MOUTH EVERY DAY  30 tablet  5  . lisinopril-hydrochlorothiazide (PRINZIDE,ZESTORETIC) 20-12.5 MG per tablet Take two tablets by mouth once daily  180  tablet  3  . Multiple Vitamin (MULTIVITAMIN) capsule Take 1 capsule by mouth daily.       [name] omeprazole (PRILOSEC) 20 MG capsule Take 20 mg by mouth as needed.       [name] OVER THE COUNTER MEDICATION Take 1 tablet by mouth daily. Airborne tablets      . amoxicillin-clavulanate (AUGMENTIN) 875-125 MG per tablet Take 1 tablet by mouth 2 (two) times daily.  20 tablet  0  . traMADol (ULTRAM) 50 MG tablet Take 1 tablet (50 mg total) by mouth every 8 (eight) hours as needed for pain.  30 tablet  1  . [DISCONTINUED] famotidine (PEPCID) 20 MG tablet Take 20 mg by mouth as needed.           Review of Systems System review is negative for any constitutional, cardiac, pulmonary, GI or neuro symptoms or complaints other than as described in the HPI.     Objective:   Physical Exam Filed Vitals:   06/18/12 1640  BP: 112/84  Pulse: 76  Temp: 97 F (36.1  C)  Resp: 10   Gen'l- WNWD AA woman in no distress Neck - enlarged thyroid gland, left > right       Assessment & Plan:

## 2012-06-23 NOTE — Assessment & Plan Note (Signed)
Thyroid goiter - follow up study Sept '13 revealed a small increase in size of the solid nodule in the left lobe of the thyroid gland. Due to the increase in size a needle biopsy was recommended. This revealed a cellular appearance that was not entirely normal but not abnormal to the point of being able to make a definitive diagnosis of cancer, "cannot entirely rule our thyroid neoplasm."  Plan  Referral to Dr. Darnell Level, general surgeon at [facility], for excisional biopsy for a definitive diagnosis.   This may take several weeks to get done but there is no evidence of a rapidly changing or dangerous lesion.  Only worry some, not a lot!!

## 2012-06-26 ENCOUNTER — Encounter: Payer: Self-pay | Admitting: Internal Medicine

## 2012-07-07 ENCOUNTER — Ambulatory Visit: Payer: Medicare Other | Admitting: Internal Medicine

## 2012-07-15 ENCOUNTER — Telehealth: Payer: Self-pay | Admitting: *Deleted

## 2012-07-15 ENCOUNTER — Encounter (INDEPENDENT_AMBULATORY_CARE_PROVIDER_SITE_OTHER): Payer: Self-pay

## 2012-07-15 ENCOUNTER — Encounter (INDEPENDENT_AMBULATORY_CARE_PROVIDER_SITE_OTHER): Payer: Self-pay | Admitting: Surgery

## 2012-07-15 ENCOUNTER — Telehealth (INDEPENDENT_AMBULATORY_CARE_PROVIDER_SITE_OTHER): Payer: Self-pay

## 2012-07-15 ENCOUNTER — Ambulatory Visit (INDEPENDENT_AMBULATORY_CARE_PROVIDER_SITE_OTHER): Payer: Medicare Other | Admitting: Surgery

## 2012-07-15 VITALS — BP 128/74 | HR 76 | Temp 97.6°F | Resp 16 | Ht 65.5 in | Wt 142.8 lb

## 2012-07-15 DIAGNOSIS — D449 Neoplasm of uncertain behavior of unspecified endocrine gland: Secondary | ICD-10-CM

## 2012-07-15 DIAGNOSIS — D440 Neoplasm of uncertain behavior of thyroid gland: Secondary | ICD-10-CM | POA: Insufficient documentation

## 2012-07-15 DIAGNOSIS — E042 Nontoxic multinodular goiter: Secondary | ICD-10-CM

## 2012-07-15 NOTE — Telephone Encounter (Signed)
Clearance request sent to Dr Riley Kill.

## 2012-07-15 NOTE — Patient Instructions (Signed)

## 2012-07-15 NOTE — Progress Notes (Signed)
General Surgery - [facility]  Chief Complaint  Patient presents with  . New Evaluation    eval thyroid nodule with atypia - referral from Dr. Michael Norins    HISTORY: Patient is a 72-year-old white female referred by her primary care physician for evaluation of enlarging multinodular thyroid goiter with cytologic atypia. Patient has had a known thyroid goiter followed on physical examination for many years. She does take Synthroid 100 mcg daily for hypothyroidism. Patient has had sequential ultrasound scans which show an enlarging dominant nodule in the left thyroid lobe measuring 7.1 cm in size. Recent fine needle aspiration biopsy showed a follicular lesion which was hypercellular with cytologic atypia including nuclear overlapped and nuclear grooves. Patient also has a 1.9 cm nodule with calcifications in the inferior pole of the right thyroid lobe.  Patient has been on thyroid hormone for several years. She has had no prior head or neck surgery. There is no family history of thyroid disease and specifically no history of thyroid cancer.  Past Medical History  Diagnosis Date  . Heart block   . Diastolic dysfunction   . HTN (hypertension)   . Acute neck pain   . Tension headache   . Multinodular goiter   . SOB (shortness of breath)   . Contact dermatitis and eczema     unspec cause  . Tooth abscess   . Thyroid nodule   . Osteoporosis   . GERD (gastroesophageal reflux disease)   . Depression   . Lung nodule   . Superficial thrombophlebitis   . Hypothyroidism   . Acute sinusitis   . History of colonoscopy      Current Outpatient Prescriptions  Medication Sig Dispense Refill  . Chlorpheniramine-APAP (CORICIDIN) 2-325 MG TABS Take 2 tablets by mouth daily as needed. For allergies      . denosumab (PROLIA) 60 MG/ML SOLN Inject 60 mg into the skin every 6 (six) months. One injection every 6 months Last dose was in October, Next dose due in April      .  ibuprofen (ADVIL,MOTRIN) 200 MG tablet Take 400 mg by mouth every 6 (six) hours as needed. For headache/pain      . levothyroxine (SYNTHROID, LEVOTHROID) 100 MCG tablet TAKE ONE TABLET BY MOUTH EVERY DAY  30 tablet  5  . lisinopril-hydrochlorothiazide (PRINZIDE,ZESTORETIC) 20-12.5 MG per tablet Take two tablets by mouth once daily  180 tablet  3  . Multiple Vitamin (MULTIVITAMIN) capsule Take 1 capsule by mouth daily.       . omeprazole (PRILOSEC) 20 MG capsule Take 20 mg by mouth as needed.       . OVER THE COUNTER MEDICATION Take 1 tablet by mouth daily. Airborne tablets      . [DISCONTINUED] famotidine (PEPCID) 20 MG tablet Take 20 mg by mouth as needed.          Allergies  Allergen Reactions  . Codeine     REACTION: N/V  . Tape      Family History  Problem Relation Age of Onset  . Heart disease Mother   . Heart failure Father   . Diverticulosis Sister   . Hypertension Brother   . Hypertension Brother   . Cancer Son     breast     History   Social History  . Marital Status: Single    Spouse Name: N/A    Number of Children: 2  . Years of Education: 16   Occupational History  . Retired   advertisement worker for the [employer]   .     Social History Main Topics  . Smoking status: Former Smoker  . Smokeless tobacco: Never Used     Comment: quit 30 years ago  . Alcohol Use: Yes  . Drug Use: No  . Sexually Active: No   Other Topics Concern  . None   Social History Narrative   The patient is a graduate of [employer].  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  She was married for 22 years, divorced, and has remained single. She has 1 grown son, 1 grown daughter.  She has 3 grandchildren, 2 of her childrenlive in [place].  She is retired.  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     REVIEW OF SYSTEMS - PERTINENT POSITIVES ONLY: Denies compressive  symptoms. Denies tremors. Denies palpitations.  EXAM: Filed Vitals:   07/15/12 1119  BP: 128/74  Pulse: 76  Temp: 97.6 F (36.4 C)  Resp: 16    HEENT: normocephalic; pupils equal and reactive; sclerae clear; dentition good; mucous membranes moist NECK:  Markedly enlarged left thyroid lobe measuring at least 7 cm in greatest dimension, soft, mobile with swallowing, extending into the thyroid isthmus with slight tracheal displacement towards the right; asymmetric on extension; no palpable anterior or posterior cervical lymphadenopathy; no supraclavicular masses; no tenderness CHEST: clear to auscultation bilaterally without rales, rhonchi, or wheezes CARDIAC: regular rate and rhythm without significant murmur; peripheral pulses are full EXT:  non-tender without edema; no deformity NEURO: no gross focal deficits; no sign of tremor   LABORATORY RESULTS: See [facility] ([facility]) for most recent results   RADIOLOGY RESULTS: See [facility] ([facility]Epic) for most recent results   IMPRESSION: #1 multinodular thyroid goiter #2 dominant left thyroid nodule, 7.1 cm, with cytologic atypia #3 dominant right thyroid nodule, 1.9 cm, with calcifications #4 hypothyroidism  PLAN: I had a lengthy discussion with the patient regarding the significance of the above findings. We reviewed all of her reports. While there is no absolute indication for thyroidectomy, I am concerned that she has a possible malignancy rate of 20-25% based on fine-needle aspiration cytology. I am also concerned about the enlarging mass on the left side leading to compressive symptoms. I have recommended total thyroidectomy.  Patient and I discussed the procedure of total thyroidectomy. We discussed potential complications including recurrent laryngeal nerve injury and injury to parathyroid glands. We discussed surgical incision and cosmetic results to be anticipated. We discussed the hospital stay and her  postoperative recovery. We discussed the need for lifelong thyroid hormone replacement therapy. We discussed the potential need for radioactive iodine treatment in the event of malignancy. She understands and wishes to proceed.  We will request cardiac clearance given her history of pacemaker placement.  The risks and benefits of the procedure have been discussed at length with the patient.  The patient understands the proposed procedure, potential alternative treatments, and the course of recovery to be expected.  All of the patient's questions have been answered at this time.  The patient wishes to proceed with surgery.  Hancel Ion M. Jasiyah Paulding, MD, FACS General & Endocrine Surgery [facility]   Visit Diagnoses: 1. 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   

## 2012-07-15 NOTE — Telephone Encounter (Signed)
PATIENT CALLED AND REQUEST A CALL BACK FROM DR. NORINS. STATES SHE SAW DR. Gerrit Friends TODAY. STATES DR. GERKIN SAYS SHE NEEDS TO HAVE SURGERY TO REMOVE HER THYROID. THEY HAVE SCHEDULED THIS TO BE DONE NEXT WEEK. PATIENT REQUEST TO ADVISE ON THIS. CB # [phone]

## 2012-07-16 ENCOUNTER — Telehealth: Payer: Self-pay | Admitting: *Deleted

## 2012-07-20 ENCOUNTER — Telehealth (INDEPENDENT_AMBULATORY_CARE_PROVIDER_SITE_OTHER): Payer: Self-pay | Admitting: General Surgery

## 2012-07-20 ENCOUNTER — Telehealth: Payer: Self-pay | Admitting: Cardiology

## 2012-07-20 NOTE — Telephone Encounter (Signed)
New problem:   [facility] is Waiting on clearance from Dr. Riley Kill  To have a removal of thyroid.

## 2012-07-20 NOTE — Telephone Encounter (Signed)
Pt called to complain that cardiac clearance is preventing the schedulers from being able to set a date for her surgery.  Apparently the request was FAXd to Dr. Riley Kill on Wednesday, 07/15/12.  Explained there has not yet been a response from his office.

## 2012-07-20 NOTE — Telephone Encounter (Signed)
I have not received a request for cardiac clearance from Dr Ardine Eng office.  In reviewing this patient's chart the pt has not seen Dr Riley Kill since 06/22/10.  The pt last saw Dr Graciela Husbands in March of 2013 for pacemaker follow-up. Dr Ardine Eng office note states that he will request cardiac clearance due to pacemaker. I will speak with Dr Riley Kill to see if he or Dr Graciela Husbands will clear this pt.

## 2012-07-21 ENCOUNTER — Telehealth (INDEPENDENT_AMBULATORY_CARE_PROVIDER_SITE_OTHER): Payer: Self-pay

## 2012-07-21 NOTE — Telephone Encounter (Signed)
I called pt and advised her request for clearance was sent directly to Dr Rosalyn Charters in basket in epic. Today I have also sent msg to Lauren Dr Rosalyn Charters nurse re: need for clearance. Pt advised orders will be sent to surgery schedulers once we have note of clearance.

## 2012-07-21 NOTE — Telephone Encounter (Signed)
1 

## 2012-07-21 NOTE — Telephone Encounter (Signed)
Per Dr Riley Kill this pt should be cleared for surgery by Dr Graciela Husbands.  I will forward this message to Dr Graciela Husbands and his nurse.

## 2012-07-22 ENCOUNTER — Telehealth: Payer: Self-pay | Admitting: Cardiology

## 2012-07-22 ENCOUNTER — Telehealth (INDEPENDENT_AMBULATORY_CARE_PROVIDER_SITE_OTHER): Payer: Self-pay

## 2012-07-22 NOTE — Telephone Encounter (Signed)
checking on status of surgical clearance

## 2012-07-22 NOTE — Telephone Encounter (Signed)
Danton Clap 07/22/2012 2:06 PM Signed  checking on status of surgical clearance

## 2012-07-22 NOTE — Telephone Encounter (Signed)
Previous note opened for the same encounter. Will close this encounter.

## 2012-07-22 NOTE — Telephone Encounter (Signed)
I sent Dr. Graciela Husbands a text message to call me back so I could speak to him about this patient. Initial request for clearance went to Dr. Rosalyn Charters inbasket in the form of Dr. Ardine Eng office note (CC'ed charts) . I have forwarded this to Dr. Graciela Husbands. I think the patient may need an office visit prior to clearance but wanted to clarify with Dr. Graciela Husbands. No call received from MD. I will forward to triage to address with Dr. Graciela Husbands tomorrow afternoon when he is back in the office and have them call the patient.

## 2012-07-22 NOTE — Telephone Encounter (Signed)
Message copied by Joanette Gula on Wed Jul 22, 2012  8:14 AM ------      Message from: Iona Coach      Created: Tue Jul 21, 2012  6:12 PM       I have spoken with Dr Riley Kill and he said that Dr Graciela Husbands should be the physician who clears the pt for surgey.  Dr Graciela Husbands follows the pt for pacemaker.  I will forward the phone message to Dr Graciela Husbands and his nurse Sherri Rad.             Lauren       ----- Message -----         From: Joanette Gula, LPN         Sent: 07/21/2012  12:04 PM           To: Sharyn Blitz, RN, Velora Heckler, MD            I noted in epic that pt called re: clearance. On 07/15/12 a clearance request was sent directly to Dr Rosalyn Charters in basket. Pt would like to have thyroid surgery as soon as possible. Please let me know as soon as you can if pt needs to be seen or you can in basket clearance note to Dr Gerrit Friends or phone encounter can be forwarded to Dr Gerrit Friends.       Thank you for your help with this.      Joanette Gula LPN

## 2012-07-23 NOTE — Telephone Encounter (Signed)
New problem:   Trying to get cardiac clearance to have surgery.

## 2012-07-23 NOTE — Telephone Encounter (Signed)
Will forward to Dr. Graciela Husbands.  Do you need to see this pt prior to clearing her for a thyroidectomy??

## 2012-07-26 ENCOUNTER — Encounter: Payer: Self-pay | Admitting: Internal Medicine

## 2012-07-26 NOTE — Telephone Encounter (Signed)
Yes  Please have her see one of the pa's

## 2012-07-27 ENCOUNTER — Telehealth: Payer: Self-pay | Admitting: Internal Medicine

## 2012-07-27 ENCOUNTER — Encounter: Payer: Self-pay | Admitting: Internal Medicine

## 2012-07-27 NOTE — Telephone Encounter (Signed)
Has not seen Dr. Graciela Husbands for sometime (Aug '12 ) and she was approaching end of life on pacemaker.  I do not know why there is a delay in seeing her but she does need evaluation before surgery.

## 2012-07-27 NOTE — Telephone Encounter (Signed)
Pt has made an appt to see Dr. Graciela Husbands.

## 2012-07-27 NOTE — Telephone Encounter (Signed)
Pt's daughter is calling to let Dr. Debby Bud know there is a hold up with scheduling the thyroid surgery.  Dr Gerrit Friends is still waiting for cardiology to give the surgical clearance.  [place] wants to know why they have not responded to messages or to Dr. Gerrit Friends.

## 2012-07-30 ENCOUNTER — Ambulatory Visit (INDEPENDENT_AMBULATORY_CARE_PROVIDER_SITE_OTHER): Payer: Medicare Other | Admitting: Nurse Practitioner

## 2012-07-30 ENCOUNTER — Encounter: Payer: Self-pay | Admitting: Nurse Practitioner

## 2012-07-30 ENCOUNTER — Telehealth (INDEPENDENT_AMBULATORY_CARE_PROVIDER_SITE_OTHER): Payer: Self-pay | Admitting: General Surgery

## 2012-07-30 VITALS — BP 124/80 | HR 78 | Ht 65.5 in | Wt 144.8 lb

## 2012-07-30 DIAGNOSIS — Z950 Presence of cardiac pacemaker: Secondary | ICD-10-CM

## 2012-07-30 DIAGNOSIS — I10 Essential (primary) hypertension: Secondary | ICD-10-CM

## 2012-07-30 DIAGNOSIS — I442 Atrioventricular block, complete: Secondary | ICD-10-CM

## 2012-07-30 NOTE — Patient Instructions (Addendum)
Keep your follow up appt

## 2012-07-30 NOTE — Telephone Encounter (Signed)
Debbie called to report that ms Casserly was seen by Dr. Verdie Shire and her clearance note is included in office visit in EPIC/gy

## 2012-07-30 NOTE — Progress Notes (Signed)
Patient Name: [name] Date of Encounter: 07/30/2012  Primary Care Provider:  Illene Regulus, MD Primary Cardiologist:  Daisy Fleming, MD  Patient Profile  72 year old female with history of heart block status post permanent pacemaker placement who presents for preoperative clearance for pending thyroidectomy.  Problem List   Past Medical History  Diagnosis Date  . Atrioventricular block, complete     a. 08/2011 Upgrage of PPM to MDT Adapta L Dual Chamber PPM ser # [id number] H.  . Diastolic dysfunction   . HTN (hypertension)   . Acute neck pain   . Tension headache   . Multinodular goiter   . SOB (shortness of breath)   . Contact dermatitis and eczema     unspec cause  . Tooth abscess   . Thyroid nodule   . Osteoporosis   . GERD (gastroesophageal reflux disease)   . Depression   . Lung nodule   . Superficial thrombophlebitis   . Hypothyroidism   . Acute sinusitis   . Mitral regurgitation     a.03/2012 Echo: EF 65-70%, mod MR  . Chest pain     a. 09/2007 Myoview: EF 77%, no ischemia/infarct.   Past Surgical History  Procedure Date  . Laporatomy for ruptured diverticulum with colostomy 2003  . Take down of colostomy 2004  . Exploratory laparotomy   . 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 Hernia repair   . Colon surgery     Allergies  Allergies  Allergen Reactions  . Tape Rash  . Codeine     REACTION: N/V    HPI  72 year old female with the above problem list.  She has a history of thyroid nodule and is now pending thyroidectomy.  She presents today for preoperative clearance.  She has been doing well at home, walking 3 miles, 5 days a week without any chest pain or shortness of breath.  She notes no significant limitations in any of her activities.  She did have a cold earlier this year but this has since resolved she's feeling well again.  She denies chest pain, palpitations, dyspnea, pnd, orthopnea, n, v, dizziness, syncope, edema, weight gain, or early  satiety.  Home Medications  Prior to Admission medications   Medication Sig Start Date End Date Taking? Authorizing Provider  Chlorpheniramine-APAP (CORICIDIN) 2-325 MG TABS Take 2 tablets by mouth daily as needed. For allergies   Yes Historical Provider, MD  denosumab (PROLIA) 60 MG/ML SOLN Inject 60 mg into the skin every 6 (six) months. One injection every 6 months Last dose was in October, Next dose due in April   Yes Historical Provider, MD  ibuprofen (ADVIL,MOTRIN) 200 MG tablet Take 400 mg by mouth every 6 (six) hours as needed. For headache/pain   Yes Historical Provider, MD  levothyroxine (SYNTHROID, LEVOTHROID) 100 MCG tablet TAKE ONE TABLET BY MOUTH EVERY DAY 05/30/12  Yes Daisy Navy, MD  lisinopril-hydrochlorothiazide [facility]) 20-12.5 MG per tablet Take two tablets by mouth once daily 03/04/12  Yes Daisy Salvia, MD  Multiple Vitamin (MULTIVITAMIN) capsule Take 1 capsule by mouth daily.    Yes Historical Provider, MD  omeprazole (PRILOSEC) 20 MG capsule Take 20 mg by mouth as needed.    Yes Historical Provider, MD  OVER THE COUNTER MEDICATION Take 1 tablet by mouth daily. Airborne tablets   Yes Historical Provider, MD   Review of Systems  As above, she has been doing well without limitations in her usual exercise routine.  She denies chest pain, palpitations, dyspnea,  pnd, orthopnea, n, v, dizziness, syncope, edema, weight gain, or early satiety.  All other systems reviewed and are otherwise negative except as noted above.  Physical Exam  Blood pressure 124/80, pulse 78, height 5' 5.5" (1.664 m), weight 144 lb 12.8 oz (65.681 kg).  General: Pleasant, NAD Psych: Normal affect. Neuro: Alert and oriented X 3. Moves all extremities spontaneously. HEENT: Normal  Neck: Supple without bruits or JVD. Lungs:  Resp regular and unlabored, CTA. Heart: RRR no s3, s4, or murmurs. Abdomen: Soft, non-tender, non-distended, BS + x 4.  Extremities: No clubbing, cyanosis or  edema. DP/PT/Radials 2+ and equal bilaterally.  Accessory Clinical Findings  ECG - with magnet-AV paced at 80 beats per minute; without magnet-a sense V. Pace at 78 beats per minute.  Assessment & Plan  1.  Complete heart block status post dual-chamber permanent pacemaker placement: The patient is doing exceptionally well.  She is exercising regularly without symptoms or limitations.  She had a normal echocardiogram in September 2013 and a normal stress test in April 2009.  She is pending thyroidectomy.  I discussed her case with Dr. Graciela [name] today and we feel that she will not require additional ischemic testing prior to surgery and is felt to be low risk for cardiac complications.  2.  Thyroid nodules:  Pending thyroidectomy.  As above no additional ischemic testing is warranted.  3.  Hypertension: Stable, continue current regimen.  4.  Disposition: Patient has followup and elective physiology clinic in March.  [name], NP 07/30/2012, 12:02 PM

## 2012-07-31 ENCOUNTER — Encounter (HOSPITAL_COMMUNITY): Payer: Self-pay | Admitting: Pharmacy Technician

## 2012-07-31 ENCOUNTER — Telehealth (INDEPENDENT_AMBULATORY_CARE_PROVIDER_SITE_OTHER): Payer: Self-pay

## 2012-07-31 ENCOUNTER — Encounter: Payer: Self-pay | Admitting: Internal Medicine

## 2012-07-31 NOTE — Telephone Encounter (Signed)
Pt notified cardiac clearance note is now in epic and orders to surgery schedulers. Pt questioned prophy antibiotics mentioned by Dr Graciela Husbands. Pt advised to call Dr Graciela Husbands to handle this request. Pt advised to avoid blood thinners 5 days before surgery.

## 2012-08-04 ENCOUNTER — Other Ambulatory Visit: Payer: Self-pay

## 2012-08-04 ENCOUNTER — Encounter (HOSPITAL_COMMUNITY): Payer: Self-pay

## 2012-08-04 ENCOUNTER — Encounter (HOSPITAL_COMMUNITY)
Admission: RE | Admit: 2012-08-04 | Discharge: 2012-08-04 | Disposition: A | Discharge status: Home / Self Care | Payer: Medicare Other | Source: Ambulatory Visit | Attending: Surgery | Admitting: Surgery

## 2012-08-04 ENCOUNTER — Ambulatory Visit (HOSPITAL_COMMUNITY)
Admission: RE | Admit: 2012-08-04 | Discharge: 2012-08-04 | Disposition: A | Discharge status: Home / Self Care | Payer: Medicare Other | Source: Ambulatory Visit | Attending: Surgery | Admitting: Surgery

## 2012-08-04 DIAGNOSIS — Z01812 Encounter for preprocedural laboratory examination: Secondary | ICD-10-CM | POA: Insufficient documentation

## 2012-08-04 DIAGNOSIS — Z01810 Encounter for preprocedural cardiovascular examination: Secondary | ICD-10-CM | POA: Insufficient documentation

## 2012-08-04 DIAGNOSIS — Z01818 Encounter for other preprocedural examination: Secondary | ICD-10-CM | POA: Insufficient documentation

## 2012-08-04 DIAGNOSIS — Z950 Presence of cardiac pacemaker: Secondary | ICD-10-CM | POA: Insufficient documentation

## 2012-08-04 DIAGNOSIS — R9431 Abnormal electrocardiogram [ECG] [EKG]: Secondary | ICD-10-CM | POA: Insufficient documentation

## 2012-08-04 HISTORY — DX: Other specified postprocedural states: Z98.890

## 2012-08-04 HISTORY — DX: Other specified postprocedural states: R11.2

## 2012-08-04 LAB — BASIC METABOLIC PANEL
BUN: 17 mg/dL (ref 6–23)
Calcium: 9.3 mg/dL (ref 8.4–10.5)
Creatinine, Ser: 0.76 mg/dL (ref 0.50–1.10)
GFR calc Af Amer: >90 mL/min (ref >90)
GFR calc non Af Amer: 81 mL/min — ABNORMAL LOW (ref >90)
Glucose, Bld: 80 mg/dL (ref 70–99)

## 2012-08-04 LAB — CBC
HCT: 38.6 % (ref 36.0–46.0)
Hemoglobin: 12.6 g/dL (ref 12.0–15.0)
MCH: 26.6 pg (ref 26.0–34.0)
MCHC: 32.6 g/dL (ref 30.0–36.0)
MCV: 81.6 fL (ref 78.0–100.0)
RDW: 12.7 % (ref 11.5–15.5)

## 2012-08-04 NOTE — Patient Instructions (Signed)
ALEKSIS JIGGETTS  08/04/2012   Your procedure is scheduled on:  08/07/12   Report to [facility] at  0830 AM.  Call this number if you have problems the morning of surgery: [phone]   Remember:   Do not eat food or drink liquids after midnight.   Take these medicines the morning of surgery with A SIP OF WATER:    Do not wear jewelry, make-up or nail polish.  Do not wear lotions, powders, or perfumes.   Do not shave 48 hours prior to surgery.   Do not bring valuables to the hospital.  Contacts, dentures or bridgework may not be worn into surgery.  Leave suitcase in the car. After surgery it may be brought to your room.  For patients admitted to the hospital, checkout time is 11:00 AM the day of  discharge.     SEE CHG INSTRUCTION SHEET    Please read over the following fact sheets that you were given: MRSA Information, coughing and deep breathing exercises, leg exercises               Failure to comply with these instructions may result in cancellation of your surgery.                Patient Signature ____________________________              Nurse Signature _____________________________

## 2012-08-04 NOTE — Progress Notes (Signed)
Quick Note:  These results are acceptable for scheduled surgery.  Moraima Burd M. Maguadalupe Lata, MD, FACS [facility] Office: [phone]   ______ 

## 2012-08-04 NOTE — Progress Notes (Signed)
Last pacer device observation - 03/09/12 EPIC

## 2012-08-04 NOTE — Progress Notes (Signed)
Office visit with Dr Graciela Husbands 09/02/11 EPIC  07/3012 Clearance with PA- [name] EPIC  03/16/12 ECHO EPIC

## 2012-08-07 ENCOUNTER — Encounter (HOSPITAL_COMMUNITY): Payer: Self-pay | Admitting: Surgery

## 2012-08-07 ENCOUNTER — Encounter (HOSPITAL_COMMUNITY): Payer: Self-pay | Admitting: Anesthesiology

## 2012-08-07 ENCOUNTER — Encounter (HOSPITAL_COMMUNITY)
Admission: RE | Disposition: A | Discharge status: Home / Self Care | Payer: Self-pay | Source: Ambulatory Visit | Attending: Surgery

## 2012-08-07 ENCOUNTER — Ambulatory Visit (HOSPITAL_COMMUNITY): Payer: Medicare Other | Admitting: Anesthesiology

## 2012-08-07 ENCOUNTER — Encounter (HOSPITAL_COMMUNITY): Payer: Self-pay | Admitting: *Deleted

## 2012-08-07 ENCOUNTER — Observation Stay (HOSPITAL_COMMUNITY)
Admission: RE | Admit: 2012-08-07 | Discharge: 2012-08-08 | Disposition: A | Discharge status: Home / Self Care | Payer: Medicare Other | Source: Ambulatory Visit | Attending: Surgery | Admitting: Surgery

## 2012-08-07 DIAGNOSIS — E039 Hypothyroidism, unspecified: Secondary | ICD-10-CM | POA: Insufficient documentation

## 2012-08-07 DIAGNOSIS — K219 Gastro-esophageal reflux disease without esophagitis: Secondary | ICD-10-CM | POA: Insufficient documentation

## 2012-08-07 DIAGNOSIS — I10 Essential (primary) hypertension: Secondary | ICD-10-CM | POA: Insufficient documentation

## 2012-08-07 DIAGNOSIS — D440 Neoplasm of uncertain behavior of thyroid gland: Secondary | ICD-10-CM

## 2012-08-07 DIAGNOSIS — M810 Age-related osteoporosis without current pathological fracture: Secondary | ICD-10-CM | POA: Insufficient documentation

## 2012-08-07 DIAGNOSIS — R6889 Other general symptoms and signs: Secondary | ICD-10-CM | POA: Insufficient documentation

## 2012-08-07 DIAGNOSIS — Z79899 Other long term (current) drug therapy: Secondary | ICD-10-CM | POA: Insufficient documentation

## 2012-08-07 DIAGNOSIS — E042 Nontoxic multinodular goiter: Secondary | ICD-10-CM

## 2012-08-07 DIAGNOSIS — D497 Neoplasm of unspecified behavior of endocrine glands and other parts of nervous system: Secondary | ICD-10-CM | POA: Insufficient documentation

## 2012-08-07 HISTORY — PX: THYROIDECTOMY: SHX17

## 2012-08-07 SURGERY — THYROIDECTOMY
Anesthesia: General | Site: Neck | Wound class: Clean

## 2012-08-07 MED ORDER — 0.9 % SODIUM CHLORIDE (POUR BTL) OPTIME
TOPICAL | Status: DC | PRN
  Administered 2012-08-07: 1000 mL

## 2012-08-07 MED ORDER — LACTATED RINGERS IV SOLN
INTRAVENOUS | Status: DC | PRN
  Administered 2012-08-07 (×2): via INTRAVENOUS

## 2012-08-07 MED ORDER — KCL IN DEXTROSE-NACL 20-5-0.45 MEQ/L-%-% IV SOLN
INTRAVENOUS | Status: AC
  Filled 2012-08-07: qty 1000

## 2012-08-07 MED ORDER — LISINOPRIL-HYDROCHLOROTHIAZIDE 20-12.5 MG PO TABS: 1.0000 | ORAL_TABLET | Freq: Every morning | ORAL | Status: DC

## 2012-08-07 MED ORDER — OXYCODONE HCL 5 MG/5ML PO SOLN
5.0000 mg | Freq: Once | ORAL | Status: DC | PRN
  Filled 2012-08-07: qty 5

## 2012-08-07 MED ORDER — METOCLOPRAMIDE HCL 5 MG/ML IJ SOLN
INTRAMUSCULAR | Status: AC
  Filled 2012-08-07: qty 2

## 2012-08-07 MED ORDER — KCL IN DEXTROSE-NACL 20-5-0.45 MEQ/L-%-% IV SOLN
INTRAVENOUS | Status: DC
  Administered 2012-08-07: 15:00:00 via INTRAVENOUS
  Filled 2012-08-07 (×2): qty 1000

## 2012-08-07 MED ORDER — HYDROMORPHONE HCL PF 1 MG/ML IJ SOLN
0.2500 mg | INTRAMUSCULAR | Status: DC | PRN
  Administered 2012-08-07 (×4): 0.5 mg via INTRAVENOUS

## 2012-08-07 MED ORDER — LISINOPRIL 20 MG PO TABS
20.0000 mg | ORAL_TABLET | Freq: Every day | ORAL | Status: DC
  Administered 2012-08-07 – 2012-08-08 (×2): 20 mg via ORAL
  Filled 2012-08-07 (×2): qty 1

## 2012-08-07 MED ORDER — ONDANSETRON HCL 4 MG PO TABS: 4.0000 mg | ORAL_TABLET | Freq: Four times a day (QID) | ORAL | Status: DC | PRN

## 2012-08-07 MED ORDER — HYDROCHLOROTHIAZIDE 12.5 MG PO CAPS: 12.5000 mg | ORAL_CAPSULE | Freq: Every day | ORAL | Status: DC

## 2012-08-07 MED ORDER — OXYCODONE HCL 5 MG PO TABS: 5.0000 mg | ORAL_TABLET | Freq: Once | ORAL | Status: DC | PRN

## 2012-08-07 MED ORDER — PROPOFOL 10 MG/ML IV BOLUS
INTRAVENOUS | Status: DC | PRN
  Administered 2012-08-07: 180 mg via INTRAVENOUS

## 2012-08-07 MED ORDER — EPHEDRINE SULFATE 50 MG/ML IJ SOLN
INTRAMUSCULAR | Status: DC | PRN
  Administered 2012-08-07 (×2): 5 mg via INTRAVENOUS

## 2012-08-07 MED ORDER — DEXAMETHASONE SODIUM PHOSPHATE 10 MG/ML IJ SOLN
INTRAMUSCULAR | Status: DC | PRN
  Administered 2012-08-07: 10 mg via INTRAVENOUS

## 2012-08-07 MED ORDER — NEOSTIGMINE METHYLSULFATE 1 MG/ML IJ SOLN
INTRAMUSCULAR | Status: DC | PRN
  Administered 2012-08-07: 3 mg via INTRAVENOUS

## 2012-08-07 MED ORDER — MIDAZOLAM HCL 5 MG/5ML IJ SOLN
INTRAMUSCULAR | Status: DC | PRN
  Administered 2012-08-07: 2 mg via INTRAVENOUS

## 2012-08-07 MED ORDER — ROCURONIUM BROMIDE 100 MG/10ML IV SOLN
INTRAVENOUS | Status: DC | PRN
  Administered 2012-08-07: 45 mg via INTRAVENOUS
  Administered 2012-08-07: 5 mg via INTRAVENOUS

## 2012-08-07 MED ORDER — LIDOCAINE HCL (CARDIAC) 20 MG/ML IV SOLN
INTRAVENOUS | Status: DC | PRN
  Administered 2012-08-07: 30 mg via INTRAVENOUS

## 2012-08-07 MED ORDER — LACTATED RINGERS IV SOLN: INTRAVENOUS | Status: DC

## 2012-08-07 MED ORDER — CEFAZOLIN SODIUM-DEXTROSE 2-3 GM-% IV SOLR
INTRAVENOUS | Status: AC
  Filled 2012-08-07: qty 50

## 2012-08-07 MED ORDER — ONDANSETRON HCL 4 MG/2ML IJ SOLN
4.0000 mg | Freq: Four times a day (QID) | INTRAMUSCULAR | Status: DC | PRN
  Administered 2012-08-07: 4 mg via INTRAVENOUS
  Filled 2012-08-07 (×2): qty 2

## 2012-08-07 MED ORDER — HYDROMORPHONE HCL PF 1 MG/ML IJ SOLN
INTRAMUSCULAR | Status: AC
  Filled 2012-08-07: qty 2

## 2012-08-07 MED ORDER — HYDROCODONE-ACETAMINOPHEN 5-325 MG PO TABS: 1.0000 | ORAL_TABLET | ORAL | Status: DC | PRN

## 2012-08-07 MED ORDER — CALCIUM CARBONATE 1250 (500 CA) MG PO TABS
1250.0000 mg | ORAL_TABLET | Freq: Three times a day (TID) | ORAL | Status: DC
  Administered 2012-08-07 – 2012-08-08 (×2): 1250 mg via ORAL
  Filled 2012-08-07 (×5): qty 1

## 2012-08-07 MED ORDER — DIPHENHYDRAMINE HCL 25 MG PO CAPS
25.0000 mg | ORAL_CAPSULE | Freq: Four times a day (QID) | ORAL | Status: DC | PRN
  Administered 2012-08-07: 25 mg via ORAL
  Filled 2012-08-07: qty 1

## 2012-08-07 MED ORDER — CEFAZOLIN SODIUM-DEXTROSE 2-3 GM-% IV SOLR
2.0000 g | INTRAVENOUS | Status: AC
  Administered 2012-08-07: 2 g via INTRAVENOUS

## 2012-08-07 MED ORDER — BUPIVACAINE HCL (PF) 0.25 % IJ SOLN
INTRAMUSCULAR | Status: AC
  Filled 2012-08-07: qty 30

## 2012-08-07 MED ORDER — HYDROMORPHONE HCL PF 1 MG/ML IJ SOLN
1.0000 mg | INTRAMUSCULAR | Status: DC | PRN
  Administered 2012-08-07 – 2012-08-08 (×4): 1 mg via INTRAVENOUS
  Filled 2012-08-07 (×4): qty 1

## 2012-08-07 MED ORDER — ACETAMINOPHEN 10 MG/ML IV SOLN
INTRAVENOUS | Status: DC | PRN
  Administered 2012-08-07 (×2): 1000 mg via INTRAVENOUS

## 2012-08-07 MED ORDER — LISINOPRIL 20 MG PO TABS: 20.0000 mg | ORAL_TABLET | Freq: Every day | ORAL | Status: DC

## 2012-08-07 MED ORDER — BUPIVACAINE-EPINEPHRINE PF 0.25-1:200000 % IJ SOLN
INTRAMUSCULAR | Status: AC
  Filled 2012-08-07: qty 30

## 2012-08-07 MED ORDER — HYDROCHLOROTHIAZIDE 12.5 MG PO CAPS
12.5000 mg | ORAL_CAPSULE | Freq: Every day | ORAL | Status: DC
  Administered 2012-08-07 – 2012-08-08 (×2): 12.5 mg via ORAL
  Filled 2012-08-07 (×2): qty 1

## 2012-08-07 MED ORDER — FENTANYL CITRATE 0.05 MG/ML IJ SOLN
INTRAMUSCULAR | Status: DC | PRN
  Administered 2012-08-07 (×3): 50 ug via INTRAVENOUS
  Administered 2012-08-07: 100 ug via INTRAVENOUS

## 2012-08-07 MED ORDER — ACETAMINOPHEN 325 MG PO TABS: 650.0000 mg | ORAL_TABLET | ORAL | Status: DC | PRN

## 2012-08-07 MED ORDER — ACETAMINOPHEN 10 MG/ML IV SOLN
INTRAVENOUS | Status: AC
  Filled 2012-08-07: qty 100

## 2012-08-07 MED ORDER — GLYCOPYRROLATE 0.2 MG/ML IJ SOLN
INTRAMUSCULAR | Status: DC | PRN
  Administered 2012-08-07: 0.4 mg via INTRAVENOUS

## 2012-08-07 MED ORDER — METOCLOPRAMIDE HCL 5 MG/ML IJ SOLN
10.0000 mg | Freq: Once | INTRAMUSCULAR | Status: AC | PRN
  Administered 2012-08-07: 10 mg via INTRAVENOUS

## 2012-08-07 MED ORDER — ONDANSETRON HCL 4 MG/2ML IJ SOLN
INTRAMUSCULAR | Status: DC | PRN
  Administered 2012-08-07: 4 mg via INTRAVENOUS

## 2012-08-07 SURGICAL SUPPLY — 39 items
ATTRACTOMAT 16X20 MAGNETIC DRP (DRAPES) ×2 IMPLANT
BENZOIN TINCTURE PRP APPL 2/3 (GAUZE/BANDAGES/DRESSINGS) IMPLANT
BLADE HEX COATED 2.75 (ELECTRODE) ×2 IMPLANT
BLADE SURG 15 STRL LF DISP TIS (BLADE) ×1 IMPLANT
BLADE SURG 15 STRL SS (BLADE) ×1
CANISTER SUCTION 2500CC (MISCELLANEOUS) ×2 IMPLANT
CHLORAPREP W/TINT 26ML (MISCELLANEOUS) ×2 IMPLANT
CLIP TI MEDIUM 6 (CLIP) ×10 IMPLANT
CLIP TI WIDE RED SMALL 6 (CLIP) ×10 IMPLANT
CLOTH BEACON ORANGE TIMEOUT ST (SAFETY) ×2 IMPLANT
CLSR STERI-STRIP ANTIMIC 1/2X4 (GAUZE/BANDAGES/DRESSINGS) ×2 IMPLANT
DISSECTOR ROUND CHERRY 3/8 STR (MISCELLANEOUS) IMPLANT
DRAPE PED LAPAROTOMY (DRAPES) ×2 IMPLANT
DRESSING SURGICEL FIBRLLR 1X2 (HEMOSTASIS) ×1 IMPLANT
DRSG SURGICEL FIBRILLAR 1X2 (HEMOSTASIS) ×2
ELECT REM PT RETURN 9FT ADLT (ELECTROSURGICAL) ×2
ELECTRODE REM PT RTRN 9FT ADLT (ELECTROSURGICAL) ×1 IMPLANT
GAUZE SPONGE 4X4 16PLY XRAY LF (GAUZE/BANDAGES/DRESSINGS) ×6 IMPLANT
GLOVE SURG ORTHO 8.0 STRL STRW (GLOVE) ×2 IMPLANT
GOWN STRL NON-REIN LRG LVL3 (GOWN DISPOSABLE) ×2 IMPLANT
GOWN STRL REIN XL XLG (GOWN DISPOSABLE) ×6 IMPLANT
KIT BASIN OR (CUSTOM PROCEDURE TRAY) ×2 IMPLANT
NS IRRIG 1000ML POUR BTL (IV SOLUTION) ×2 IMPLANT
PACK BASIC VI WITH GOWN DISP (CUSTOM PROCEDURE TRAY) ×2 IMPLANT
PENCIL BUTTON HOLSTER BLD 10FT (ELECTRODE) ×2 IMPLANT
SHEARS HARMONIC 9CM CVD (BLADE) ×2 IMPLANT
SPONGE GAUZE 4X4 12PLY (GAUZE/BANDAGES/DRESSINGS) ×2 IMPLANT
STAPLER VISISTAT 35W (STAPLE) ×2 IMPLANT
STRIP CLOSURE SKIN 1/2X4 (GAUZE/BANDAGES/DRESSINGS) ×2 IMPLANT
SUT MNCRL AB 4-0 PS2 18 (SUTURE) ×2 IMPLANT
SUT SILK 2 0 (SUTURE)
SUT SILK 2-0 18XBRD TIE 12 (SUTURE) IMPLANT
SUT SILK 3 0 (SUTURE)
SUT SILK 3-0 18XBRD TIE 12 (SUTURE) IMPLANT
SUT VIC AB 3-0 SH 18 (SUTURE) ×2 IMPLANT
SYR BULB IRRIGATION 50ML (SYRINGE) ×2 IMPLANT
TAPE PAPER 3X10 WHT MICROPORE (GAUZE/BANDAGES/DRESSINGS) ×2 IMPLANT
TOWEL OR 17X26 10 PK STRL BLUE (TOWEL DISPOSABLE) ×4 IMPLANT
YANKAUER SUCT BULB TIP 10FT TU (MISCELLANEOUS) ×2 IMPLANT

## 2012-08-07 NOTE — Interval H&P Note (Signed)
History and Physical Interval Note:  08/07/2012 11:08 AM  [name]  has presented today for surgery, with the diagnosis of goiter nodule with atypia.  The various methods of treatment have been discussed with the patient and family. After consideration of risks, benefits and other options for treatment, the patient has consented to    Procedure(s) (LRB) with comments: THYROIDECTOMY (N/A) as a surgical intervention .    The patient's history has been reviewed, patient examined, no change in status, stable for surgery.  I have reviewed the patient's chart and labs.  Questions were answered to the patient's satisfaction.    Velora Heckler, MD, [facility] Office: [phone]    Sharmayne Jablon Judie Petit

## 2012-08-07 NOTE — H&P (View-Only) (Signed)
General Surgery [facility]  Chief Complaint  Patient presents with  . New Evaluation    eval thyroid nodule with atypia - referral from Dr. Illene Regulus    HISTORY: Patient is a 72 year old white female referred by her primary care physician for evaluation of enlarging multinodular thyroid goiter with cytologic atypia. Patient has had a known thyroid goiter followed on physical examination for many years. She does take Synthroid 100 mcg daily for hypothyroidism. Patient has had sequential ultrasound scans which show an enlarging dominant nodule in the left thyroid lobe measuring 7.1 cm in size. Recent fine needle aspiration biopsy showed a follicular lesion which was hypercellular with cytologic atypia including nuclear overlapped and nuclear grooves. Patient also has a 1.9 cm nodule with calcifications in the inferior pole of the right thyroid lobe.  Patient has been on thyroid hormone for several years. She has had no prior head or neck surgery. There is no family history of thyroid disease and specifically no history of thyroid cancer.  Past Medical History  Diagnosis Date  . Heart block   . Diastolic dysfunction   . HTN (hypertension)   . Acute neck pain   . Tension headache   . Multinodular goiter   . SOB (shortness of breath)   . Contact dermatitis and eczema     unspec cause  . Tooth abscess   . Thyroid nodule   . Osteoporosis   . GERD (gastroesophageal reflux disease)   . Depression   . Lung nodule   . Superficial thrombophlebitis   . Hypothyroidism   . Acute sinusitis   . History of colonoscopy      Current Outpatient Prescriptions  Medication Sig Dispense Refill  . Chlorpheniramine-APAP (CORICIDIN) 2-325 MG TABS Take 2 tablets by mouth daily as needed. For allergies      . denosumab (PROLIA) 60 MG/ML SOLN Inject 60 mg into the skin every 6 (six) months. One injection every 6 months Last dose was in October, Next dose due in April      .  ibuprofen (ADVIL,MOTRIN) 200 MG tablet Take 400 mg by mouth every 6 (six) hours as needed. For headache/pain      . levothyroxine (SYNTHROID, LEVOTHROID) 100 MCG tablet TAKE ONE TABLET BY MOUTH EVERY DAY  30 tablet  5  . lisinopril-hydrochlorothiazide (PRINZIDE,ZESTORETIC) 20-12.5 MG per tablet Take two tablets by mouth once daily  180 tablet  3  . Multiple Vitamin (MULTIVITAMIN) capsule Take 1 capsule by mouth daily.       [name] omeprazole (PRILOSEC) 20 MG capsule Take 20 mg by mouth as needed.       [name] OVER THE COUNTER MEDICATION Take 1 tablet by mouth daily. Airborne tablets      . [DISCONTINUED] famotidine (PEPCID) 20 MG tablet Take 20 mg by mouth as needed.          Allergies  Allergen Reactions  . Codeine     REACTION: N/V  . Tape      Family History  Problem Relation Age of Onset  . Heart disease Mother   . Heart failure Father   . Diverticulosis Sister   . Hypertension Brother   . Hypertension Brother   . Cancer Son     breast     History   Social History  . Marital Status: Single    Spouse Name: N/A    Number of Children: 2  . Years of Education: 16   Occupational History  . Retired  Nurse, adult for the [employer]   .     Social History Main Topics  . Smoking status: Former Games developer  . Smokeless tobacco: Never Used     Comment: quit 30 years ago  . Alcohol Use: Yes  . Drug Use: No  . Sexually Active: No   Other Topics Concern  . None   Social History Narrative   The patient is a Buyer, retail of [employer].  She worked for the [employer] in [employer].  She was married for 22 years, divorced, and has remained single. She has 1 grown son, 1 grown daughter.  She has 3 grandchildren, 2 of her childrenlive in [place].  She is retired.  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     REVIEW OF SYSTEMS - PERTINENT POSITIVES ONLY: Denies compressive  symptoms. Denies tremors. Denies palpitations.  EXAM: Filed Vitals:   07/15/12 1119  BP: 128/74  Pulse: 76  Temp: 97.6 F (36.4 C)  Resp: 16    HEENT: normocephalic; pupils equal and reactive; sclerae clear; dentition good; mucous membranes moist NECK:  Markedly enlarged left thyroid lobe measuring at least 7 cm in greatest dimension, soft, mobile with swallowing, extending into the thyroid isthmus with slight tracheal displacement towards the right; asymmetric on extension; no palpable anterior or posterior cervical lymphadenopathy; no supraclavicular masses; no tenderness CHEST: clear to auscultation bilaterally without rales, rhonchi, or wheezes CARDIAC: regular rate and rhythm without significant murmur; peripheral pulses are full EXT:  non-tender without edema; no deformity NEURO: no gross focal deficits; no sign of tremor   LABORATORY RESULTS: See [facility] (CHL-Epic) for most recent results   RADIOLOGY RESULTS: See [facility] (CHL-Epic) for most recent results   IMPRESSION: #1 multinodular thyroid goiter #2 dominant left thyroid nodule, 7.1 cm, with cytologic atypia #3 dominant right thyroid nodule, 1.9 cm, with calcifications #4 hypothyroidism  PLAN: I had a lengthy discussion with the patient regarding the significance of the above findings. We reviewed all of her reports. While there is no absolute indication for thyroidectomy, I am concerned that she has a possible malignancy rate of 20-25% based on fine-needle aspiration cytology. I am also concerned about the enlarging mass on the left side leading to compressive symptoms. I have recommended total thyroidectomy.  Patient and I discussed the procedure of total thyroidectomy. We discussed potential complications including recurrent laryngeal nerve injury and injury to parathyroid glands. We discussed surgical incision and cosmetic results to be anticipated. We discussed the hospital stay and her  postoperative recovery. We discussed the need for lifelong thyroid hormone replacement therapy. We discussed the potential need for radioactive iodine treatment in the event of malignancy. She understands and wishes to proceed.  We will request cardiac clearance given her history of pacemaker placement.  The risks and benefits of the procedure have been discussed at length with the patient.  The patient understands the proposed procedure, potential alternative treatments, and the course of recovery to be expected.  All of the patient's questions have been answered at this time.  The patient wishes to proceed with surgery.  Velora Heckler, MD, FACS General & Endocrine Surgery [facility]   Visit Diagnoses: 1. 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

## 2012-08-07 NOTE — Anesthesia Preprocedure Evaluation (Signed)
Anesthesia Evaluation  Patient identified by MRN, date of birth, ID band Patient awake    Reviewed: Allergy & Precautions, H&P , NPO status , Patient's Chart, lab work & pertinent test results, reviewed documented beta blocker date and time   History of Anesthesia Complications (+) PONV  Airway Mallampati: II TM Distance: >3 FB Neck ROM: full    Dental   Pulmonary neg pulmonary ROS,  breath sounds clear to auscultation        Cardiovascular hypertension, On Medications + Peripheral Vascular Disease + dysrhythmias + pacemaker Rhythm:regular     Neuro/Psych  Headaches, PSYCHIATRIC DISORDERS    GI/Hepatic Neg liver ROS, GERD-  Medicated and Controlled,  Endo/Other  Hypothyroidism   Renal/GU negative Renal ROS  negative genitourinary   Musculoskeletal   Abdominal   Peds  Hematology negative hematology ROS (+)   Anesthesia Other Findings See surgeon's H&P   Reproductive/Obstetrics negative OB ROS                           Anesthesia Physical Anesthesia Plan  ASA: III  Anesthesia Plan: General   Post-op Pain Management:    Induction: Intravenous  Airway Management Planned: Oral ETT  Additional Equipment:   Intra-op Plan:   Post-operative Plan: Extubation in OR  Informed Consent: I have reviewed the patients History and Physical, chart, labs and discussed the procedure including the risks, benefits and alternatives for the proposed anesthesia with the patient or authorized representative who has indicated his/her understanding and acceptance.   Dental Advisory Given  Plan Discussed with: CRNA and Surgeon  Anesthesia Plan Comments:         Anesthesia Quick Evaluation

## 2012-08-07 NOTE — Transfer of Care (Signed)
Immediate Anesthesia Transfer of Care Note  Patient: [name]  Procedure(s) Performed: Procedure(s) (LRB) with comments: THYROIDECTOMY (N/A)  Patient Location: PACU  Anesthesia Type:General  Level of Consciousness: awake, alert  and oriented  Airway & Oxygen Therapy: Patient Spontanous Breathing and Patient connected to face mask oxygen  Post-op Assessment: Report given to PACU RN and Post -op Vital signs reviewed and stable  Post vital signs: Reviewed and stable  Complications: No apparent anesthesia complications

## 2012-08-07 NOTE — Op Note (Signed)
[name], [name]               ACCOUNT NO.:  [id number]  MEDICAL RECORD NO.:  [id number]  LOCATION:  1522                         FACILITY:  [facility]  PHYSICIAN:  Velora Heckler, MD      DATE OF BIRTH:  [birth year]  DATE OF PROCEDURE:  08/07/2012                               OPERATIVE REPORT   PREOPERATIVE DIAGNOSIS:  Multinodular thyroid goiter with cytologic atypia.  POSTOPERATIVE DIAGNOSIS:  Multinodular thyroid goiter with cytologic atypia.  PROCEDURE:  Total thyroidectomy.  SURGEON:  Velora Heckler, MD, FACS  ANESTHESIA:  General per Dr. Hart Robinsons.  ESTIMATED BLOOD LOSS:  250 mL.  PREPARATION:  ChloraPrep.  COMPLICATIONS:  None.  INDICATIONS:  The patient is a 72 year old white female referred by her primary physician for an enlarging multinodular goiter with cytologic atypia.  The patient had a known thyroid goiter detected on physical examination for many years.  She had been on thyroid hormone suppression.  Sequential ultrasound scanning showed progressive enlargement of the left thyroid lobe.  Fine needle aspiration biopsy showed a follicular lesion with cytologic atypia.  The patient now comes to Surgery for thyroidectomy for definitive diagnosis and management of enlarging goiter.  BODY OF REPORT:  Procedure was done in OR #11 at the [facility].  The patient was brought to the operating room and placed in supine position on the operating room table.  Following administration of general anesthesia, the patient was positioned and then prepped and draped in the usual strict aseptic fashion.  After ascertaining that an adequate level of anesthesia had been achieved, a Kocher incision was made with a #15 blade.  Dissection was carried through the subcutaneous tissues and platysma.  Hemostasis was obtained with the electrocautery.  Skin flaps were elevated cephalad and caudad from the thyroid notch to the sternal notch.  A Mahorner  self-retaining retractor was placed for exposure.  Strap muscles were incised in the midline and dissection was begun on the left side.  Left lobe was markedly enlarged and displaces the trachea to the right.  The left lobe was extremely vascular and bleeding was controlled by direct pressure on the surface of the gland.  Gland was gently mobilized with blunt dissection.  The inferior venous tributaries were divided between Ligaclips with the Harmonic scalpel.  Middle thyroid vein was divided between Ligaclips with the Harmonic scalpel.  Superior pole was dissected out and superior pole vessels divided individually between small and medium Ligaclips with the Harmonic scalpel.  Superior parathyroid gland was identified and preserved.  Gland was rolled anteriorly.  Branches of the inferior thyroid artery were divided between small Ligaclips.  Recurrent nerve was identified.  It was displaced laterally and anteriorly by the growth of the goiter.  It was present on the surface of the goiter and gently mobilized with blunt dissection.  It was preserved along its length.  Gland was rolled further anteriorly and remaining branches of the inferior thyroid artery were divided between Ligaclips.  Gland was mobilized onto the anterior trachea.  Isthmus was mobilized across the midline.  Isthmus was markedly attenuated.  There was no pyramidal lobe.  Dry packs were placed in the left  neck.  Next, we turned our attention to the right thyroid lobe.  Again, strap muscles were reflected laterally.  Right lobe was normal in size, but contains multiple firm nodules.  Middle thyroid vein was divided between Ligaclips with the Harmonic scalpel.  Superior pole was dissected out and extends well cephalad.  Individual vessels were divided between Ligaclips with the Harmonic scalpel.  Again, the superior parathyroid gland was identified and preserved.  Recurrent laryngeal nerve was identified and preserved  along its length.  Inferior venous tributaries were divided between Ligaclips with the Harmonic scalpel.  Branches of the inferior thyroid artery were divided between small Ligaclips with the Harmonic scalpel.  Ligament of Allyson Sabal was released with the electrocautery and the gland was mobilized onto the anterior airway from which, it was completely excised with the electrocautery.  Suture was used to mark the left superior pole.  The entire thyroid gland was submitted to Pathology for review.  The neck was irrigated with warm saline.  Good hemostasis was achieved throughout the operative field.  Fibrillar was placed throughout the operative field.  Strap muscles were reapproximated in the midline with interrupted 3-0 Vicryl sutures.  Subcutaneous tissues were irrigated and good hemostasis was noted.  Platysma was closed with interrupted 3-0 Vicryl sutures.  Skin was closed with a running 4-0 Monocryl subcuticular suture.  Wound was washed and dried, and Steri-Strips were applied.  Light gauze dressing was applied.  The patient was awakened from anesthesia and brought to the recovery room.  The patient tolerated the procedure well.   Velora Heckler, MD, FACS General & Endocrine Surgery [facility] Office: [phone]   TMG/MEDQ  D:  08/07/2012  T:  08/07/2012  Job:  [id number]  cc:   Daisy Gess. Norins, MD

## 2012-08-07 NOTE — Brief Op Note (Signed)
08/07/2012  1:23 PM  PATIENT:  [name]  72 y.o. female  PRE-OPERATIVE DIAGNOSIS:  Multinodular thyroid goiter with atypia   POST-OPERATIVE DIAGNOSIS:  same  PROCEDURE:  Total thyroidectomy   SURGEON:  Surgeon(s) and Role:    * Velora Heckler, MD - Primary  ANESTHESIA:   general  EBL:  Total I/O In: 1000 [I.V.:1000] Out: 300 [Blood:300]  BLOOD ADMINISTERED:none  DRAINS: none   LOCAL MEDICATIONS USED:  NONE  SPECIMEN:  Excision  DISPOSITION OF SPECIMEN:  PATHOLOGY  COUNTS:  YES  TOURNIQUET:  * No tourniquets in log *  DICTATION: .Other Dictation: Dictation Number [id number]  PLAN OF CARE: Admit for overnight observation  PATIENT DISPOSITION:  PACU - hemodynamically stable.   Delay start of Pharmacological VTE agent (>24hrs) due to surgical blood loss or risk of bleeding: yes  Velora Heckler, MD, FACS General & Endocrine Surgery [facility] Office: [phone]

## 2012-08-08 LAB — BASIC METABOLIC PANEL
BUN: 10 mg/dL (ref 6–23)
CO2: 25 mEq/L (ref 19–32)
Calcium: 8.3 mg/dL — ABNORMAL LOW (ref 8.4–10.5)
Chloride: 95 mEq/L — ABNORMAL LOW (ref 96–112)
Creatinine, Ser: 0.59 mg/dL (ref 0.50–1.10)
GFR calc Af Amer: >90 mL/min (ref >90)
GFR calc non Af Amer: 88 mL/min — ABNORMAL LOW (ref >90)
Glucose, Bld: 149 mg/dL — ABNORMAL HIGH (ref 70–99)
Potassium: 3.8 mEq/L (ref 3.5–5.1)
Sodium: 131 mEq/L — ABNORMAL LOW (ref 135–145)

## 2012-08-08 LAB — CBC
MCH: 27.4 pg (ref 26.0–34.0)
Platelets: 137 10*3/uL — ABNORMAL LOW (ref 150–400)
RBC: 4.41 MIL/uL (ref 3.87–5.11)
RDW: 13 % (ref 11.5–15.5)
WBC: 11.1 10*3/uL — ABNORMAL HIGH (ref 4.0–10.5)

## 2012-08-08 MED ORDER — SODIUM CHLORIDE 0.9 % IV SOLN
1.0000 g | INTRAVENOUS | Status: AC
  Administered 2012-08-08: 1 g via INTRAVENOUS
  Filled 2012-08-08: qty 10

## 2012-08-08 MED ORDER — CALCIUM CARBONATE 1250 (500 CA) MG PO TABS: 1250.0000 mg | ORAL_TABLET | Freq: Three times a day (TID) | ORAL | Status: DC

## 2012-08-08 MED ORDER — CALCIUM CARBONATE 1250 (500 CA) MG PO TABS: 2.0000 | ORAL_TABLET | Freq: Three times a day (TID) | ORAL | Status: DC

## 2012-08-08 MED ORDER — HYDROCODONE-ACETAMINOPHEN 5-325 MG PO TABS: 1.0000 | ORAL_TABLET | ORAL | Status: DC | PRN

## 2012-08-08 NOTE — Plan of Care (Signed)
Problem: Discharge Progression Outcomes Goal: Steri-Strips applied Outcome: Completed/Met Date Met:  08/08/12 Steri strips already in place from surgery; only gauze dsg applied.

## 2012-08-08 NOTE — Discharge Summary (Signed)
Physician Discharge Summary [facility]  Patient ID: [name] MRN: [id number] DOB/AGE: February 16, 1941 71 y.o.  Admit date: 08/07/2012 Discharge date: 08/08/2012  Admission Diagnoses:  Multinodular goiter with atypia  Discharge Diagnoses:  Principal Problem:   Neoplasm of uncertain behavior of thyroid gland Active Problems:   GOITER, MULTINODULAR   Discharged Condition: good  Hospital Course: patient admitted for observation after total thyroidectomy.  Post op course uncomplicated.  Calcium level slight low on AM following surgery at 8.3.  Will give one dose calcium gluconate IV prior to discharge and continue po calcium supplements.  Prepared for discharge on POD#1.  Consults: None  Significant Diagnostic Studies: labs: calcium level  Treatments: surgery: total thyroidectomy  Discharge Exam: Blood pressure 112/67, pulse 74, temperature 97.3 F (36.3 C), temperature source Oral, resp. rate 18, height 5' 5.5" (1.664 m), weight 144 lb 12.8 oz (65.681 kg), SpO2 100.00%. HEENT - clear Neck - soft, minimal STS; voice normal; wound clear and dry Chest - clear Cor - RRR  Disposition: Home with family  Discharge Orders   Future Appointments Provider Department Dept Phone   09/02/2012 10:00 AM Duke Salvia, MD [facility] Main Office [place]) [phone]   Future Orders Complete By Expires     Diet - low sodium heart healthy  As directed     Discharge instructions  As directed     Comments:      THYROID & PARATHYROID SURGERY - POST OP INSTRUCTIONS  Always review your discharge instruction sheet from the facility where your surgery was performed.  A prescription for pain medication may be given to you upon discharge.  Take your pain medication as prescribed.  If narcotic pain medicine is not needed, then you may take acetaminophen (Tylenol) or ibuprofen (Advil) as needed.  Take your usually prescribed medications unless otherwise directed.  If you  need a refill on your pain medication, please contact your pharmacy. They will contact our office to request authorization.  Prescriptions will not be processed after 5 pm or on weekends.  Start with a light diet upon arrival home, such as soup and crackers or toast.  Be sure to drink plenty of fluids daily.  Resume your normal diet the day after surgery.  Most patients will experience some swelling and bruising on the chest and neck area.  Ice packs will help.  Swelling and bruising can take several days to resolve.   It is common to experience some constipation if taking pain medication after surgery.  Increasing fluid intake and taking a stool softener will usually help or prevent this problem.  A mild laxative (Milk of Magnesia or Miralax) should be taken according to package directions if there are no bowel movements after 48 hours.  You may remove your bandages 24-48 hours after surgery, and you may shower at that time.  You have steri-strips (small skin tapes) in place directly over the incision.  These strips should be left on the skin for 7-10 days and then removed.  You may resume regular (light) daily activities beginning the next day-such as daily self-care, walking, climbing stairs-gradually increasing activities as tolerated.  You may have sexual intercourse when it is comfortable.  Refrain from any heavy lifting or straining until approved by your doctor.  You may drive when you no longer are taking prescription pain medication, you can comfortably wear a seatbelt, and you can safely maneuver your car and apply brakes.  You should see your doctor in the  office for a follow-up appointment approximately two to three weeks after your surgery.  Make sure that you call for this appointment within a day or two after you arrive home to insure a convenient appointment time.  WHEN TO CALL YOUR DOCTOR: -- Fever greater than 101.5 -- Inability to urinate -- Nausea and/or vomiting -  persistent -- Extreme swelling or bruising -- Continued bleeding from incision -- Increased pain, redness, or drainage from the incision -- Difficulty swallowing or breathing -- Muscle cramping or spasms -- Numbness or tingling in hands or around lips  The clinic staff is available to answer your questions during regular business hours.  Please don't hesitate to call and ask to speak to one of the nurses if you have concerns.  Velora Heckler, MD, FACS General & Endocrine Surgery [facility] Office: [phone]    Increase activity slowly  As directed     Remove dressing in 24 hours  As directed         Medication List    TAKE these medications       calcium carbonate 1250 MG tablet  Commonly known as:  OS-CAL - dosed in mg of elemental calcium  Take 1 tablet (1,250 mg total) by mouth 3 (three) times daily with meals.     calcium carbonate 1250 MG tablet  Commonly known as:  OS-CAL - dosed in mg of elemental calcium  Take 2 tablets (1,000 mg of elemental calcium total) by mouth 3 (three) times daily.     CORICIDIN 2-325 MG Tabs  Generic drug:  Chlorpheniramine-APAP  Take 2 tablets by mouth daily as needed. For allergies     diphenhydramine-acetaminophen 25-500 MG Tabs  Commonly known as:  TYLENOL PM  Take 1 tablet by mouth at bedtime as needed.     HYDROcodone-acetaminophen 5-325 MG per tablet  Commonly known as:  NORCO/VICODIN  Take 1-2 tablets by mouth every 4 (four) hours as needed for pain.     ibuprofen 200 MG tablet  Commonly known as:  ADVIL,MOTRIN  Take 400 mg by mouth every 6 (six) hours as needed. For headache/pain     levothyroxine 100 MCG tablet  Commonly known as:  SYNTHROID, LEVOTHROID  Take 100 mcg by mouth daily before breakfast.     lisinopril-hydrochlorothiazide 20-12.5 MG per tablet  Commonly known as:  PRINZIDE,ZESTORETIC  Take 1 tablet by mouth every morning.     multivitamin capsule  Take 1 capsule by mouth daily.      omeprazole 20 MG capsule  Commonly known as:  PRILOSEC  Take 20 mg by mouth daily as needed. Heart burn     OVER THE COUNTER MEDICATION  Take 1 tablet by mouth daily. Airborne tablets- when she goes out in public     PROLIA 60 MG/ML Soln injection  Generic drug:  denosumab  Inject 60 mg into the skin every 6 (six) months. One injection every 6 months  Last dose was in October, Next dose due in April         Velora Heckler, MD, [facility] Office: [phone]   Signed: Velora Heckler 08/08/2012, 8:41 AM

## 2012-08-08 NOTE — Anesthesia Postprocedure Evaluation (Signed)
Anesthesia Post Note  Patient: [name]  Procedure(s) Performed: Procedure(s) (LRB): THYROIDECTOMY (N/A)  Anesthesia type: general  Patient location: PACU  Post pain: Pain level controlled  Post assessment: Patient's Cardiovascular Status Stable  Last Vitals:  Filed Vitals:   08/08/12 0600  BP: 112/67  Pulse: 74  Temp: 36.3 C  Resp: 18    Post vital signs: Reviewed and stable  Level of consciousness: sedated  Complications: No apparent anesthesia complications

## 2012-08-10 ENCOUNTER — Encounter (HOSPITAL_COMMUNITY): Payer: Self-pay | Admitting: Surgery

## 2012-08-10 ENCOUNTER — Telehealth (INDEPENDENT_AMBULATORY_CARE_PROVIDER_SITE_OTHER): Payer: Self-pay

## 2012-08-10 NOTE — Telephone Encounter (Signed)
Pt home doing well. Po appt made.Lab slip mailed to pt.

## 2012-08-12 ENCOUNTER — Ambulatory Visit: Payer: Medicare Other | Admitting: Cardiology

## 2012-08-12 NOTE — Progress Notes (Signed)
Quick Note:  Please contact patient and notify of benign pathology results.  Devan Danzer M. Chaney Maclaren, MD, FACS [facility] Office: [phone]   ______ 

## 2012-08-18 ENCOUNTER — Telehealth (INDEPENDENT_AMBULATORY_CARE_PROVIDER_SITE_OTHER): Payer: Self-pay

## 2012-08-18 NOTE — Telephone Encounter (Signed)
LMOM for pt to call.  When pt returns call pt can be given path result as benign adenoma.

## 2012-08-19 NOTE — Telephone Encounter (Signed)
Patient called back and this RN gave path results.

## 2012-08-26 ENCOUNTER — Ambulatory Visit (INDEPENDENT_AMBULATORY_CARE_PROVIDER_SITE_OTHER): Payer: Medicare Other | Admitting: Surgery

## 2012-08-26 ENCOUNTER — Encounter (INDEPENDENT_AMBULATORY_CARE_PROVIDER_SITE_OTHER): Payer: Self-pay | Admitting: Surgery

## 2012-08-26 VITALS — BP 142/90 | HR 72 | Temp 97.2°F | Resp 16 | Ht 65.5 in | Wt 142.6 lb

## 2012-08-26 DIAGNOSIS — E042 Nontoxic multinodular goiter: Secondary | ICD-10-CM

## 2012-08-26 DIAGNOSIS — D449 Neoplasm of uncertain behavior of unspecified endocrine gland: Secondary | ICD-10-CM

## 2012-08-26 NOTE — Patient Instructions (Signed)
  COCOA BUTTER & VITAMIN E CREAM  (Palmer[employer] or other brand)  Apply cocoa butter/vitamin E cream to your incision 2 - 3 times daily.  Massage cream into incision for one minute with each application.  Use sunscreen (50 SPF or higher) for first 6 months after surgery if area is exposed to sun.  You may substitute Mederma or other scar reducing creams as desired.   

## 2012-08-26 NOTE — Progress Notes (Signed)
General Surgery [facility]  Visit Diagnoses: 1. Neoplasm of uncertain behavior of thyroid gland   2. GOITER, MULTINODULAR     HISTORY: The patient returns for her first postoperative appointment having undergone total thyroidectomy on 08/07/2012. Final pathology showed adenomatous nodules. No malignancy was identified. Patient continues to take Synthroid 100 mcg daily.  Postop serum calcium level is normal at 9.0.  EXAM: Surgical incision is nicely healing. There is minimal soft tissue swelling. No sign of seroma. No sign of infection. Voice quality is normal.  IMPRESSION: Status post total thyroidectomy for adenomatous goiter  PLAN: Patient will continue to take Synthroid 100 mcg daily. We will check her TSH level in 6 weeks. She will begin applying topical creams to her incision. She will return to see me for a final wound check and review of her laboratory studies in 6 weeks.  Velora Heckler, MD, FACS General & Endocrine Surgery [facility]

## 2012-09-02 ENCOUNTER — Encounter: Payer: Self-pay | Admitting: Internal Medicine

## 2012-09-02 ENCOUNTER — Ambulatory Visit (INDEPENDENT_AMBULATORY_CARE_PROVIDER_SITE_OTHER): Payer: Medicare Other | Admitting: Internal Medicine

## 2012-09-02 VITALS — BP 131/74 | Resp 79 | Ht 65.5 in | Wt 144.6 lb

## 2012-09-02 DIAGNOSIS — I442 Atrioventricular block, complete: Secondary | ICD-10-CM

## 2012-09-02 DIAGNOSIS — Z950 Presence of cardiac pacemaker: Secondary | ICD-10-CM

## 2012-09-02 LAB — PACEMAKER DEVICE OBSERVATION
AL AMPLITUDE: 4 mv
AL IMPEDENCE PM: 559 Ohm
AL THRESHOLD: 0.5 V
RV LEAD IMPEDENCE PM: 636 Ohm
RV LEAD THRESHOLD: 0.75 V

## 2012-09-02 NOTE — Assessment & Plan Note (Signed)
.  dfnb The patient's device was interrogated.  The information was reviewed. No changes were made in the programming.    

## 2012-09-02 NOTE — Assessment & Plan Note (Signed)
Stable post pacing 

## 2012-09-02 NOTE — Progress Notes (Signed)
Patient Care Team: Jacques Navy, MD as PCP - General   HPI  [name] is a 72 y.o. female Seen in followup for a pacemaker implanted for high-grade heart block which reached ERI 3/13 at that time underwent generator replacement.  She is feeling well having just undergone thyroid surgery  Past Medical History  Diagnosis Date  . Atrioventricular block, complete     a. 08/2011 Upgrage of PPM to MDT Adapta L Dual Chamber PPM ser # [id number].  . Diastolic dysfunction   . HTN (hypertension)   . Acute neck pain   . Tension headache   . Multinodular goiter   . Contact dermatitis and eczema     unspec cause  . Tooth abscess   . Thyroid nodule   . Osteoporosis   . GERD (gastroesophageal reflux disease)   . Depression   . Lung nodule   . Superficial thrombophlebitis   . Hypothyroidism   . Acute sinusitis   . Mitral regurgitation     a.03/2012 Echo: EF 65-70%, mod MR  . Chest pain     a. 09/2007 Myoview: EF 77%, no ischemia/infarct.  [name] PONV (postoperative nausea and vomiting)   . Pacemaker     Past Surgical History  Procedure Laterality Date  . Laporatomy for ruptured diverticulum with colostomy  2003  . Take down of colostomy  2004  . Exploratory laparotomy    . Medtronic pacemaker  05/2003  . Hernia repair    . Colon surgery    . Thyroidectomy N/A 08/07/2012    Procedure: THYROIDECTOMY;  Surgeon: Velora Heckler, MD;  Location: [facility]S;  Service: General;  Laterality: N/A;    Current Outpatient Prescriptions  Medication Sig Dispense Refill  . calcium carbonate (OS-CAL - DOSED IN MG OF ELEMENTAL CALCIUM) 1250 MG tablet Take 500 mg by mouth 3 (three) times daily.      . Chlorpheniramine-APAP (CORICIDIN) 2-325 MG TABS Take 2 tablets by mouth daily as needed. For allergies      . denosumab (PROLIA) 60 MG/ML SOLN Inject 60 mg into the skin every 6 (six) months. One injection every 6 months Last dose was in October, Next dose due in April      .  diphenhydramine-acetaminophen (TYLENOL PM) 25-500 MG TABS Take 1 tablet by mouth at bedtime as needed.      [name] ibuprofen (ADVIL,MOTRIN) 200 MG tablet Take 400 mg by mouth every 6 (six) hours as needed. For headache/pain      . levothyroxine (SYNTHROID, LEVOTHROID) 100 MCG tablet Take 100 mcg by mouth daily before breakfast.      . lisinopril-hydrochlorothiazide (PRINZIDE,ZESTORETIC) 20-12.5 MG per tablet Take 1 tablet by mouth every morning.      . Multiple Vitamin (MULTIVITAMIN) capsule Take 1 capsule by mouth daily.       [name] omeprazole (PRILOSEC) 20 MG capsule Take 20 mg by mouth daily as needed. Heart burn      . OVER THE COUNTER MEDICATION Take 1 tablet by mouth daily. Airborne tablets- when she goes out in public      . [DISCONTINUED] famotidine (PEPCID) 20 MG tablet Take 20 mg by mouth as needed.        No current facility-administered medications for this visit.    Allergies  Allergen Reactions  . Tape Rash  . Codeine     REACTION: N/V    Review of Systems negative except from HPI and PMH  Physical Exam BP 131/74  Resp 79  Ht  5' 5.5" (1.664 m)  Wt 144 lb 9.6 oz (65.59 kg)  BMI 23.69 kg/m2 Well developed and well nourished in no acute distress HENT normal E scleral and icterus clear Neck Supple; scar JVP flat; carotids brisk and full Clear to ausculation Device pocket well healed; without hematoma or erythema Regular rate and rhythm, no murmurs gallops or rub Soft with active bowel sounds No clubbing cyanosis none Edema Alert and oriented, grossly normal motor and sensory function Skin Warm and Dry    Assessment and  Plan

## 2012-09-08 ENCOUNTER — Encounter (INDEPENDENT_AMBULATORY_CARE_PROVIDER_SITE_OTHER): Payer: Self-pay

## 2012-09-28 ENCOUNTER — Ambulatory Visit: Payer: Medicare Other | Admitting: Internal Medicine

## 2012-09-29 ENCOUNTER — Ambulatory Visit: Payer: Medicare Other | Admitting: Internal Medicine

## 2012-10-14 ENCOUNTER — Encounter (INDEPENDENT_AMBULATORY_CARE_PROVIDER_SITE_OTHER): Payer: Self-pay | Admitting: Surgery

## 2012-10-14 ENCOUNTER — Ambulatory Visit (INDEPENDENT_AMBULATORY_CARE_PROVIDER_SITE_OTHER): Payer: Medicare Other | Admitting: Surgery

## 2012-10-14 VITALS — BP 116/88 | HR 70 | Temp 96.7°F | Resp 16 | Ht 65.5 in | Wt 141.8 lb

## 2012-10-14 DIAGNOSIS — D440 Neoplasm of uncertain behavior of thyroid gland: Secondary | ICD-10-CM

## 2012-10-14 DIAGNOSIS — E042 Nontoxic multinodular goiter: Secondary | ICD-10-CM

## 2012-10-14 DIAGNOSIS — D449 Neoplasm of uncertain behavior of unspecified endocrine gland: Secondary | ICD-10-CM

## 2012-10-14 DIAGNOSIS — E890 Postprocedural hypothyroidism: Secondary | ICD-10-CM | POA: Insufficient documentation

## 2012-10-14 MED ORDER — LEVOTHYROXINE SODIUM 88 MCG PO TABS: 88.0000 ug | ORAL_TABLET | Freq: Every day | ORAL | Status: DC

## 2012-10-14 NOTE — Progress Notes (Signed)
General Surgery [facility]  Visit Diagnoses: 1. Neoplasm of uncertain behavior of thyroid gland   2. GOITER, MULTINODULAR   3. Hypothyroidism, postsurgical     HISTORY: Patient returns for second postoperative visit having undergone total thyroidectomy for benign disease. She is currently taking Synthroid 100 mcg daily. TSH level on 10/09/2012 is slightly low at 0.411. She is applying topical creams to her incision 3 times daily.  EXAM: Surgical incision is healing very nicely with a good cosmetic result. No seroma. No sign of infection. Voice quality is normal.  IMPRESSION: Status post total thyroidectomy  PLAN: I am going to change her Synthroid dose to 88 mcg daily. We will ask her to obtain a TSH level in about 6 weeks. My goal is to keep her TSH level between 1.0 and 2.0. I will turn over the management of her thyroid hormone replacement therapy to her primary care physician at this time.  Patient will return as needed.  Velora Heckler, MD, FACS General & Endocrine Surgery [facility]

## 2012-10-14 NOTE — Patient Instructions (Signed)

## 2012-10-20 ENCOUNTER — Ambulatory Visit (INDEPENDENT_AMBULATORY_CARE_PROVIDER_SITE_OTHER): Payer: Medicare Other

## 2012-10-20 DIAGNOSIS — M810 Age-related osteoporosis without current pathological fracture: Secondary | ICD-10-CM

## 2012-10-20 MED ORDER — DENOSUMAB 60 MG/ML ~~LOC~~ SOLN
60.0000 mg | Freq: Once | SUBCUTANEOUS | Status: AC
  Administered 2012-10-20: 60 mg via SUBCUTANEOUS

## 2012-10-21 ENCOUNTER — Encounter (INDEPENDENT_AMBULATORY_CARE_PROVIDER_SITE_OTHER): Payer: Self-pay

## 2012-11-27 ENCOUNTER — Telehealth (INDEPENDENT_AMBULATORY_CARE_PROVIDER_SITE_OTHER): Payer: Self-pay

## 2012-11-27 NOTE — Telephone Encounter (Signed)
TSH here and to Dr Gerrit Friends for review.

## 2012-11-30 ENCOUNTER — Telehealth (INDEPENDENT_AMBULATORY_CARE_PROVIDER_SITE_OTHER): Payer: Self-pay

## 2012-11-30 NOTE — Telephone Encounter (Signed)
Per Dr Ardine Eng request copy of labs and ov note faxed to Dr Norin's office.

## 2012-12-11 ENCOUNTER — Other Ambulatory Visit: Payer: Self-pay | Admitting: Internal Medicine

## 2012-12-11 ENCOUNTER — Encounter: Payer: Self-pay | Admitting: Internal Medicine

## 2012-12-11 DIAGNOSIS — E890 Postprocedural hypothyroidism: Secondary | ICD-10-CM

## 2012-12-18 ENCOUNTER — Ambulatory Visit (INDEPENDENT_AMBULATORY_CARE_PROVIDER_SITE_OTHER)
Admission: RE | Admit: 2012-12-18 | Discharge: 2012-12-18 | Disposition: A | Discharge status: Home / Self Care | Payer: Medicare Other | Source: Ambulatory Visit | Attending: Internal Medicine | Admitting: Internal Medicine

## 2012-12-18 ENCOUNTER — Ambulatory Visit (INDEPENDENT_AMBULATORY_CARE_PROVIDER_SITE_OTHER): Payer: Medicare Other | Admitting: Internal Medicine

## 2012-12-18 ENCOUNTER — Other Ambulatory Visit (INDEPENDENT_AMBULATORY_CARE_PROVIDER_SITE_OTHER): Payer: Medicare Other

## 2012-12-18 ENCOUNTER — Encounter: Payer: Self-pay | Admitting: Internal Medicine

## 2012-12-18 VITALS — BP 142/98 | HR 72 | Temp 96.7°F | Resp 16 | Wt 142.0 lb

## 2012-12-18 DIAGNOSIS — R0602 Shortness of breath: Secondary | ICD-10-CM

## 2012-12-18 DIAGNOSIS — R0789 Other chest pain: Secondary | ICD-10-CM

## 2012-12-18 DIAGNOSIS — K219 Gastro-esophageal reflux disease without esophagitis: Secondary | ICD-10-CM

## 2012-12-18 DIAGNOSIS — R5383 Other fatigue: Secondary | ICD-10-CM

## 2012-12-18 DIAGNOSIS — R5381 Other malaise: Secondary | ICD-10-CM

## 2012-12-18 DIAGNOSIS — R42 Dizziness and giddiness: Secondary | ICD-10-CM

## 2012-12-18 DIAGNOSIS — R531 Weakness: Secondary | ICD-10-CM

## 2012-12-18 DIAGNOSIS — M549 Dorsalgia, unspecified: Secondary | ICD-10-CM

## 2012-12-18 LAB — CBC
HCT: 41.3 % (ref 36.0–46.0)
Hemoglobin: 13.8 g/dL (ref 12.0–15.0)
MCHC: 33.5 g/dL (ref 30.0–36.0)
MCV: 82.9 fl (ref 78.0–100.0)
RDW: 14 % (ref 11.5–14.6)
WBC: 4.4 10*3/uL — ABNORMAL LOW (ref 4.5–10.5)

## 2012-12-18 LAB — COMPREHENSIVE METABOLIC PANEL
ALT: 15 U/L (ref 0–35)
Alkaline Phosphatase: 46 U/L (ref 39–117)
CO2: 26 mEq/L (ref 19–32)
Creatinine, Ser: 0.7 mg/dL (ref 0.4–1.2)
GFR: 105.09 mL/min (ref >60.00)
Sodium: 136 mEq/L (ref 135–145)
Total Bilirubin: 0.9 mg/dL (ref 0.3–1.2)

## 2012-12-18 LAB — AMYLASE: Amylase: 74 U/L (ref 27–131)

## 2012-12-18 LAB — LIPASE: Lipase: 33 U/L (ref 11.0–59.0)

## 2012-12-18 LAB — TROPONIN I: Troponin I: 0.01 ng/mL (ref <0.06)

## 2012-12-18 MED ORDER — LIDOCAINE 5 % EX PTCH: 1.0000 | MEDICATED_PATCH | CUTANEOUS | Status: DC

## 2012-12-18 NOTE — Progress Notes (Signed)
Subjective:    Patient ID: [name], female    DOB: 29-Jan-1941, 72 y.o.   MRN: [id number]  HPI  Pt presents to the clinic today with c/o worsening reflux. She is on omeprazole, and tums for this. This got worse over the last few weeks. She also c/o shortness of breath. This started within the last 2 days. She feels like she can not take a deep breath. She feels like someone is sitting on her chest. It seems to be worse with exertion. She has no history asthma, allergies or COPD. She also c/o fatigue, lightheadedness, and dizziness. She denies chest pain. She denies nausea and vomiting. Additionally, she c/o left neck pain and left shoulder pain. She describes this as sore and achy. She does not remember if she has had anything like this in the past. She has taking ibuprofen, which has helped. She has not had an injury that she is aware of.   She also c/o worsening back pain. She has a history of back pain. 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 She is wondering if there is something topical she can put on it.  Review of Systems      Past Medical History  Diagnosis Date  . Atrioventricular block, complete     a. 08/2011 Upgrage of PPM to MDT Adapta L Dual Chamber PPM ser # [id number] H.  . Diastolic dysfunction   . HTN (hypertension)   . Acute neck pain   . Tension headache   . Multinodular goiter   . Contact dermatitis and eczema     unspec cause  . Tooth abscess   . Thyroid nodule   . Osteoporosis   . GERD (gastroesophageal reflux disease)   . Depression   . Lung nodule   . Superficial thrombophlebitis   . Hypothyroidism   . Acute sinusitis   . Mitral regurgitation     a.03/2012 Echo: EF 65-70%, mod MR  . Chest pain     a. 09/2007 Myoview: EF 77%, no ischemia/infarct.  [name] PONV (postoperative nausea and vomiting)   . Pacemaker     Current Outpatient Prescriptions  Medication Sig Dispense Refill  . calcium carbonate (OS-CAL - DOSED IN MG OF  ELEMENTAL CALCIUM) 1250 MG tablet Take 500 mg by mouth 3 (three) times daily.      . Chlorpheniramine-APAP (CORICIDIN) 2-325 MG TABS Take 2 tablets by mouth daily as needed. For allergies      . denosumab (PROLIA) 60 MG/ML SOLN Inject 60 mg into the skin every 6 (six) months. One injection every 6 months Last dose was in October, Next dose due in April      . diphenhydramine-acetaminophen (TYLENOL PM) 25-500 MG TABS Take 1 tablet by mouth at bedtime as needed.      [name] ibuprofen (ADVIL,MOTRIN) 200 MG tablet Take 400 mg by mouth every 6 (six) hours as needed. For headache/pain      . levothyroxine (SYNTHROID, LEVOTHROID) 88 MCG tablet Take 1 tablet (88 mcg total) by mouth daily before breakfast.  30 tablet  11  . lisinopril-hydrochlorothiazide (PRINZIDE,ZESTORETIC) 20-12.5 MG per tablet Take 1 tablet by mouth every morning.      . Multiple Vitamin (MULTIVITAMIN) capsule Take 1 capsule by mouth daily.       [name] omeprazole (PRILOSEC) 20 MG capsule Take 20 mg by mouth daily as needed. Heart burn      . OVER THE COUNTER MEDICATION Take 1 tablet by mouth daily. Airborne  tablets- when she goes out in public      . [DISCONTINUED] famotidine (PEPCID) 20 MG tablet Take 20 mg by mouth as needed.        No current facility-administered medications for this visit.    Allergies  Allergen Reactions  . Tape Rash  . Codeine     REACTION: N/V    Family History  Problem Relation Age of Onset  . Heart disease Mother   . Heart failure Father   . Diverticulosis Sister   . Hypertension Brother   . Hypertension Brother   . Cancer Son     breast    History   Social History  . Marital Status: Single    Spouse Name: N/A    Number of Children: 2  . Years of Education: 16   Occupational History  . 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     Social History Main Topics  . Smoking status: Former Games developer  . Smokeless tobacco: Never Used     Comment: quit 30 years ago  . Alcohol Use: No   . Drug Use: No  . Sexually Active: No   Other Topics Concern  . Not on file   Social History Narrative   The patient is a Buyer, retail of [employer].  She worked for the [employer] in [employer].  She was married for 22 years, divorced, and has remained single. She has 1 grown son, 1 grown daughter.  She has 3 grandchildren, 2 of her children   live in [place].  She is retired.  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     Constitutional: Pt reports fatigue. Denies fever, malaise, headache or abrupt weight changes.  HEENT: Denies eye pain, eye redness, ear pain, ringing in the ears, wax buildup, runny nose, nasal congestion, bloody nose, or sore throat. Respiratory: Pt reports shortness of breath. Denies difficulty breathing, shortness of breath, cough or sputum production.   Cardiovascular: Denies chest pain, chest tightness, palpitations or swelling in the hands or feet.  Gastrointestinal: Denies abdominal pain, bloating, constipation, diarrhea or blood in the stool.  Musculoskeletal: Pt reports generalized weakness. Denies decrease in range of motion, difficulty with gait, muscle pain or joint pain and swelling.  Skin: Denies redness, rashes, lesions or ulcercations.  Neurological: Pt reports dizziness and lightheadedness. Denies, difficulty with memory, difficulty with speech or problems with balance and coordination.   No other specific complaints in a complete review of systems (except as listed in HPI above).  Objective:   Physical Exam  BP 142/98  Pulse 72  Temp(Src) 96.7 F (35.9 C) (Oral)  Resp 16  Wt 142 lb (64.411 kg)  BMI 23.26 kg/m2  SpO2 99% Wt Readings from Last 3 Encounters:  12/18/12 142 lb (64.411 kg)  10/14/12 141 lb 12.8 oz (64.32 kg)  09/02/12 144 lb 9.6 oz (65.59 kg)    General: Appears her stated age, well developed, well nourished in  NAD. Cardiovascular: Normal rate and rhythm. S1,S2 noted.  No murmur, rubs or gallops noted. No JVD or BLE edema. No carotid bruits noted. Pulmonary/Chest: Increased effort and positive vesicular breath sounds. No respiratory distress. No wheezes, rales or ronchi noted.  Abdomen: Soft and nontender. Normal bowel sounds, no bruits noted. No distention or masses noted. Liver, spleen and kidneys non palpable. Musculoskeletal: Pinpoint tenderness noted on  the left 3-4 ribs. Normal range of motion. No signs of joint swelling. No difficulty with gait.  Neurological: Alert and oriented. Cranial nerves II-XII intact. Coordination normal. +DTRs bilaterally. Negative Rhomberg.  EKG: unchanged from 08/2012  BMET    Component Value Date/Time   NA 131* 08/08/2012 0448   K 3.8 08/08/2012 0448   CL 95* 08/08/2012 0448   CO2 25 08/08/2012 0448   GLUCOSE 149* 08/08/2012 0448   BUN 10 08/08/2012 0448   CREATININE 0.59 08/08/2012 0448   CALCIUM 8.3* 08/08/2012 0448   GFRNONAA 88* 08/08/2012 0448   GFRAA >90 08/08/2012 0448    Lipid Panel     Component Value Date/Time   CHOL  Value: 150        ATP III CLASSIFICATION:  <200     mg/dL   Desirable  213-086  mg/dL   Borderline High  >=578    mg/dL   High        4/69/6295 0423   TRIG 34 11/12/2008 0423   HDL 46 11/12/2008 0423   CHOLHDL 3.3 11/12/2008 0423   VLDL 7 11/12/2008 0423   LDLCALC  Value: 97        Total Cholesterol/HDL:CHD Risk Coronary Heart Disease Risk Table                     Men   Women  1/2 Average Risk   3.4   3.3  Average Risk       5.0   4.4  2 X Average Risk   9.6   7.1  3 X Average Risk  23.4   11.0        Use the calculated Patient Ratio above and the CHD Risk Table to determine the patient's CHD Risk.        ATP III CLASSIFICATION (LDL):  <100     mg/dL   Optimal  284-132  mg/dL   Near or Above                    Optimal  130-159  mg/dL   Borderline  440-102  mg/dL   High  >725     mg/dL   Very High 3/66/4403 4742    CBC    Component Value Date/Time    WBC 11.1* 08/08/2012 0448   RBC 4.41 08/08/2012 0448   HGB 12.1 08/08/2012 0448   HCT 35.7* 08/08/2012 0448   PLT 137* 08/08/2012 0448   MCV 81.0 08/08/2012 0448   MCH 27.4 08/08/2012 0448   MCHC 33.9 08/08/2012 0448   RDW 13.0 08/08/2012 0448   LYMPHSABS 1.2 09/12/2011 1222   MONOABS 0.4 09/12/2011 1222   EOSABS 0.0 09/12/2011 1222   BASOSABS 0.0 09/12/2011 1222    Hgb A1C No results found for this basename: HGBA1C         Assessment & Plan:   Fatigue, lightheadedness, dizziness, shortness of breath, muscle weakeness, GERD:  Will check EKG to r/o ACS although not a lot of risk factors Will check chest xray for SOB- r/o infection vs underlying pulmonary disease Will obtain labs including cardiac panel  Back pain, chronic:  eRx for lidoderm patches   Will call you with results, if worse go to ER immediately

## 2012-12-18 NOTE — Patient Instructions (Signed)
Chest Pain (Nonspecific) °It is often hard to give a specific diagnosis for the cause of chest pain. There is always a chance that your pain could be related to something serious, such as a heart attack or a blood clot in the lungs. You need to follow up with your caregiver for further evaluation. °CAUSES  °· Heartburn. °· Pneumonia or bronchitis. °· Anxiety or stress. °· Inflammation around your heart (pericarditis) or lung (pleuritis or pleurisy). °· A blood clot in the lung. °· A collapsed lung (pneumothorax). It can develop suddenly on its own (spontaneous pneumothorax) or from injury (trauma) to the chest. °· Shingles infection (herpes zoster virus). °The chest wall is composed of bones, muscles, and cartilage. Any of these can be the source of the pain. °· The bones can be bruised by injury. °· The muscles or cartilage can be strained by coughing or overwork. °· The cartilage can be affected by inflammation and become sore (costochondritis). °DIAGNOSIS  °Lab tests or other studies, such as X-rays, electrocardiography, stress testing, or cardiac imaging, may be needed to find the cause of your pain.  °TREATMENT  °· Treatment depends on what may be causing your chest pain. Treatment may include: °· Acid blockers for heartburn. °· Anti-inflammatory medicine. °· Pain medicine for inflammatory conditions. °· Antibiotics if an infection is present. °· You may be advised to change lifestyle habits. This includes stopping smoking and avoiding alcohol, caffeine, and chocolate. °· You may be advised to keep your head raised (elevated) when sleeping. This reduces the chance of acid going backward from your stomach into your esophagus. °· Most of the time, nonspecific chest pain will improve within 2 to 3 days with rest and mild pain medicine. °HOME CARE INSTRUCTIONS  °· If antibiotics were prescribed, take your antibiotics as directed. Finish them even if you start to feel better. °· For the next few days, avoid physical  activities that bring on chest pain. Continue physical activities as directed. °· Do not smoke. °· Avoid drinking alcohol. °· Only take over-the-counter or prescription medicine for pain, discomfort, or fever as directed by your caregiver. °· Follow your caregiver's suggestions for further testing if your chest pain does not go away. °· Keep any follow-up appointments you made. If you do not go to an appointment, you could develop lasting (chronic) problems with pain. If there is any problem keeping an appointment, you must call to reschedule. °SEEK MEDICAL CARE IF:  °· You think you are having problems from the medicine you are taking. Read your medicine instructions carefully. °· Your chest pain does not go away, even after treatment. °· You develop a rash with blisters on your chest. °SEEK IMMEDIATE MEDICAL CARE IF:  °· You have increased chest pain or pain that spreads to your arm, neck, jaw, back, or abdomen. °· You develop shortness of breath, an increasing cough, or you are coughing up blood. °· You have severe back or abdominal pain, feel nauseous, or vomit. °· You develop severe weakness, fainting, or chills. °· You have a fever. °THIS IS AN EMERGENCY. Do not wait to see if the pain will go away. Get medical help at once. Call your local emergency services (911 in [place] Do not drive yourself to the hospital. °MAKE SURE YOU:  °· Understand these instructions. °· Will watch your condition. °· Will get help right away if you are not doing well or get worse. °Document Released: 03/27/2005 Document Revised: 09/09/2011 Document Reviewed: 01/21/2008 °ExitCare® Patient Information ©2014 [employer]   [employer]. ° °

## 2012-12-20 ENCOUNTER — Encounter: Payer: Self-pay | Admitting: Internal Medicine

## 2012-12-22 ENCOUNTER — Telehealth: Payer: Self-pay | Admitting: Internal Medicine

## 2012-12-22 NOTE — Telephone Encounter (Signed)
Spoke with pt, she reports since Friday SOB at rest and with little exertion. She reports talking to me she feels her breathing is labored. She denies edema and trouble with SOB in bed. Also when she takes a deep breath she has a tightness that starts in the center of her chest and radiates into the left breast. The ache will come and go. She thought it was reflux and has taken tums and omeprazole with no relief. There is no change in the discomfort with movement. Explained to pt it did not sound like it was related to her pacer. She denies cough or fever. She will follow up with dr Debby Bud or go to the ER tonight if she cont to have problems.

## 2012-12-22 NOTE — Telephone Encounter (Signed)
New problem  Pt states she is having shortness of breath and discomfort in near her breast bone when she takes a deep breath.

## 2012-12-28 ENCOUNTER — Encounter: Payer: Self-pay | Admitting: Internal Medicine

## 2012-12-28 ENCOUNTER — Ambulatory Visit (INDEPENDENT_AMBULATORY_CARE_PROVIDER_SITE_OTHER): Payer: Medicare Other | Admitting: Internal Medicine

## 2012-12-28 VITALS — BP 164/100 | HR 73 | Temp 97.4°F | Resp 12 | Ht 65.5 in | Wt 142.0 lb

## 2012-12-28 DIAGNOSIS — M940 Chondrocostal junction syndrome [Tietze]: Secondary | ICD-10-CM

## 2012-12-28 DIAGNOSIS — R918 Other nonspecific abnormal finding of lung field: Secondary | ICD-10-CM

## 2012-12-28 DIAGNOSIS — R9389 Abnormal findings on diagnostic imaging of other specified body structures: Secondary | ICD-10-CM

## 2012-12-28 NOTE — Patient Instructions (Addendum)
Reviewed all chest x-rays and CT scans: YOU DO NOT HAVE CLINICAL OR RADIOGRAPHIC EVIDENCE OF COPD!!!!!  Chest wall pain is costochondritis - inflammation of the joint between the rib and the sternum.  Costochondritis Costochondritis (Tietze syndrome), or costochondral separation, is a swelling and irritation (inflammation) of the tissue (cartilage) that connects your ribs with your breastbone (sternum). It may occur on its own (spontaneously), through damage caused by an accident (trauma), or simply from coughing or minor exercise. It may take up to 6 weeks to get better and longer if you are unable to be conservative in your activities. HOME CARE INSTRUCTIONS   Avoid exhausting physical activity. Try not to strain your ribs during normal activity. This would include any activities using chest, belly (abdominal), and side muscles, especially if heavy weights are used.  Use ice for 15-20 minutes per hour while awake for the first 2 days. Place the ice in a plastic bag, and place a towel between the bag of ice and your skin.  Only take over-the-counter or prescription medicines for pain, discomfort, or fever as directed by your caregiver. SEEK IMMEDIATE MEDICAL CARE IF:   Your pain increases or you are very uncomfortable.  You have a fever.  You develop difficulty with your breathing.  You cough up blood.  You develop worse chest pains, shortness of breath, sweating, or vomiting.  You develop new, unexplained problems (symptoms). MAKE SURE YOU:   Understand these instructions.  Will watch your condition.  Will get help right away if you are not doing well or get worse. Document Released: 03/27/2005 Document Revised: 09/09/2011 Document Reviewed: 02/03/2008 [facility] Patient Information 2014 [place], [place].

## 2012-12-29 NOTE — Progress Notes (Signed)
Subjective:    Patient ID: [name], female    DOB: 26-Jul-1940, 72 y.o.   MRN: [id number]  HPI [name] read that on her last x-ray the radiologist diagnosed COPD, which was a shock to her. She is here for explanation.  She has had mid-sternal pain. [name] raised the issue of lung vs breast disease. The pain is positional and tender to touch.  She has made a marvelous recovery from thyroid surgery.  Past Medical History  Diagnosis Date  . Atrioventricular block, complete     a. 08/2011 Upgrage of PPM to MDT Adapta L Dual Chamber PPM ser # [id number] H.  . Diastolic dysfunction   . HTN (hypertension)   . Acute neck pain   . Tension headache   . Multinodular goiter   . Contact dermatitis and eczema     unspec cause  . Tooth abscess   . Thyroid nodule   . Osteoporosis   . GERD (gastroesophageal reflux disease)   . Depression   . Lung nodule   . Superficial thrombophlebitis   . Hypothyroidism   . Acute sinusitis   . Mitral regurgitation     a.03/2012 Echo: EF 65-70%, mod MR  . Chest pain     a. 09/2007 Myoview: EF 77%, no ischemia/infarct.  [name] PONV (postoperative nausea and vomiting)   . Pacemaker    Past Surgical History  Procedure Laterality Date  . Laporatomy for ruptured diverticulum with colostomy  11-10-2001  . Take down of colostomy  2002/11/11  . Exploratory laparotomy    . Medtronic pacemaker  05/2003  . Hernia repair    . Colon surgery    . Thyroidectomy N/A 08/07/2012    Procedure: THYROIDECTOMY;  Surgeon: Velora Heckler, MD;  Location: [facility];  Service: General;  Laterality: N/A;   Family History  Problem Relation Age of Onset  . Heart disease Mother   . Heart failure Father   . Diverticulosis Sister   . Hypertension Brother   . Hypertension Brother   . Cancer Son     breast   History   Social History  . Marital Status: Single    Spouse Name: N/A    Number of Children: 2  . Years of Education: 16   Occupational History  . Retired Nurse, adult  for the [employer]   .     Social History Main Topics  . Smoking status: Former Games developer  . Smokeless tobacco: Never Used     Comment: quit 30 years ago  . Alcohol Use: No  . Drug Use: No  . Sexually Active: No   Other Topics Concern  . Not on file   Social History Narrative   The patient is a Buyer, retail of [employer].  She worked for the [employer] in [employer].  She was married for 22 years, divorced, and has remained single. She has 1 grown son, 1 grown daughter.  She has 3 grandchildren, 2 of her children   live in [place].  She is retired.  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   Current Outpatient Prescriptions on File Prior to Visit  Medication Sig Dispense Refill  . calcium carbonate (OS-CAL - DOSED IN MG OF ELEMENTAL CALCIUM) 1250 MG tablet Take 500 mg by mouth 3 (three) times daily.      . Chlorpheniramine-APAP (CORICIDIN) 2-325 MG  TABS Take 2 tablets by mouth daily as needed. For allergies      . denosumab (PROLIA) 60 MG/ML SOLN Inject 60 mg into the skin every 6 (six) months. One injection every 6 months Last dose was in October, Next dose due in April      . diphenhydramine-acetaminophen (TYLENOL PM) 25-500 MG TABS Take 1 tablet by mouth at bedtime as needed.      [name] ibuprofen (ADVIL,MOTRIN) 200 MG tablet Take 400 mg by mouth every 6 (six) hours as needed. For headache/pain      . levothyroxine (SYNTHROID, LEVOTHROID) 88 MCG tablet Take 1 tablet (88 mcg total) by mouth daily before breakfast.  30 tablet  11  . lidocaine (LIDODERM) 5 % Place 1 patch onto the skin daily. Remove & Discard patch within 12 hours or as directed by MD  30 patch  0  . lisinopril-hydrochlorothiazide (PRINZIDE,ZESTORETIC) 20-12.5 MG per tablet Take 1 tablet by mouth every morning.      . Multiple Vitamin (MULTIVITAMIN) capsule Take 1 capsule by mouth daily.       [name] omeprazole  (PRILOSEC) 20 MG capsule Take 20 mg by mouth daily as needed. Heart burn      . OVER THE COUNTER MEDICATION Take 1 tablet by mouth daily. Airborne tablets- when she goes out in public      . [DISCONTINUED] famotidine (PEPCID) 20 MG tablet Take 20 mg by mouth as needed.        No current facility-administered medications on file prior to visit.      Review of Systems System review is negative for any constitutional, cardiac, pulmonary, GI or neuro symptoms or complaints other than as described in the HPI.     Objective:   Physical Exam Filed Vitals:   12/28/12 1132  BP: 164/100  Pulse: 73  Temp: 97.4 F (36.3 C)  Resp: 12   genl'l - WNWD woman looking younger than her age Neck- imperceptible surgical scar anterior neck Chest - very tender at left costo-sternal junction R6-8 Pulm - clear lungs       Assessment & Plan:  1. Abnormal x-ray - reviewed several previous cxr's and CT chest - no prior evidence or report of COPD. She does have veery full lung expansion when she takes a deep breath and some flattening of the diaphragm. It was explained that a radiologist could interpret this as a sign of COPD  Plan  reassurance.  2. Costochondritis - classic presentation on exam.  Plan Lineament of choice and heat.

## 2013-01-11 ENCOUNTER — Encounter: Payer: Self-pay | Admitting: Internal Medicine

## 2013-01-11 DIAGNOSIS — E890 Postprocedural hypothyroidism: Secondary | ICD-10-CM

## 2013-01-11 MED ORDER — LEVOTHYROXINE SODIUM 88 MCG PO TABS: 88.0000 ug | ORAL_TABLET | Freq: Every day | ORAL | Status: DC

## 2013-01-14 ENCOUNTER — Encounter: Payer: Self-pay | Admitting: Internal Medicine

## 2013-01-19 ENCOUNTER — Other Ambulatory Visit: Payer: Self-pay | Admitting: Gastroenterology

## 2013-01-19 HISTORY — PX: COLONOSCOPY: SHX174

## 2013-02-03 ENCOUNTER — Encounter: Payer: Self-pay | Admitting: Internal Medicine

## 2013-02-03 ENCOUNTER — Other Ambulatory Visit: Payer: Self-pay

## 2013-03-04 ENCOUNTER — Encounter: Payer: Self-pay | Admitting: Internal Medicine

## 2013-03-05 ENCOUNTER — Other Ambulatory Visit: Payer: Self-pay | Admitting: Internal Medicine

## 2013-03-24 ENCOUNTER — Encounter: Payer: Self-pay | Admitting: Internal Medicine

## 2013-03-24 ENCOUNTER — Ambulatory Visit (INDEPENDENT_AMBULATORY_CARE_PROVIDER_SITE_OTHER): Payer: Medicare Other | Admitting: Internal Medicine

## 2013-03-24 VITALS — BP 142/100 | HR 70 | Temp 97.9°F | Wt 142.8 lb

## 2013-03-24 DIAGNOSIS — S76012A Strain of muscle, fascia and tendon of left hip, initial encounter: Secondary | ICD-10-CM

## 2013-03-24 DIAGNOSIS — IMO0002 Reserved for concepts with insufficient information to code with codable children: Secondary | ICD-10-CM

## 2013-03-24 DIAGNOSIS — Z23 Encounter for immunization: Secondary | ICD-10-CM

## 2013-03-24 MED ORDER — MELOXICAM 15 MG PO TABS: 15.0000 mg | ORAL_TABLET | Freq: Every day | ORAL | Status: DC

## 2013-03-24 NOTE — Patient Instructions (Addendum)
Low back pain - on exam there is no radiculopathy (evidence of nerve compression, like from a herniated disk). There is point tenderness in the gluteal muscles over the area of the sacro-iliace joint.   Plan Meloxicam 15 mg once a day (equivalent of 800 mg ibuprofen three times a day but safer)  Heat and massage  Referral to physical therapy.   Try looking at YouTube.com - search words: gluteal strain and stretch

## 2013-03-24 NOTE — Progress Notes (Signed)
Subjective:    Patient ID: [name], female    DOB: 1941/04/28, 72 y.o.   MRN: [id number]  HPI 3 weeks of low back pain with radiation of pain left thigh and leg. No parethesia. She has tried ibuprofen 400 mg qid without much help; has tried aspercreme with some relief; heat patch - with some help. No incontinence.  Past Medical History  Diagnosis Date  . Atrioventricular block, complete     a. 08/2011 Upgrage of PPM to MDT Adapta L Dual Chamber PPM ser # [id number] H.  . Diastolic dysfunction   . HTN (hypertension)   . Acute neck pain   . Tension headache   . Multinodular goiter   . Contact dermatitis and eczema     unspec cause  . Tooth abscess   . Thyroid nodule   . Osteoporosis   . GERD (gastroesophageal reflux disease)   . Depression   . Lung nodule   . Superficial thrombophlebitis   . Hypothyroidism   . Acute sinusitis   . Mitral regurgitation     a.03/2012 Echo: EF 65-70%, mod MR  . Chest pain     a. 09/2007 Myoview: EF 77%, no ischemia/infarct.  [name] PONV (postoperative nausea and vomiting)   . Pacemaker    Past Surgical History  Procedure Laterality Date  . Laporatomy for ruptured diverticulum with colostomy  11-Nov-2001  . Take down of colostomy  2002-11-12  . Exploratory laparotomy    . Medtronic pacemaker  05/2003  . Hernia repair    . Colon surgery    . Thyroidectomy N/A 08/07/2012    Procedure: THYROIDECTOMY;  Surgeon: Velora Heckler, MD;  Location: [facility];  Service: General;  Laterality: N/A;  . Colonoscopy  01/19/2013    colonoscopy   Family History  Problem Relation Age of Onset  . Heart disease Mother   . Heart failure Father   . Diverticulosis Sister   . Hypertension Brother   . Hypertension Brother   . Cancer Son     breast   History   Social History  . Marital Status: Single    Spouse Name: N/A    Number of Children: 2  . Years of Education: 16   Occupational History  . 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     Social History  Main Topics  . Smoking status: Former Games developer  . Smokeless tobacco: Never Used     Comment: quit 30 years ago  . Alcohol Use: No  . Drug Use: No  . Sexual Activity: No   Other Topics Concern  . Not on file   Social History Narrative   The patient is a Buyer, retail of [employer].  She worked for the [employer] in [employer].  She was married for 22 years, divorced, and has remained single. She has 1 grown son, 1 grown daughter.  She has 3 grandchildren, 2 of her children   live in [place].  She is retired.  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    Current Outpatient Prescriptions on File Prior to Visit  Medication Sig Dispense Refill  . calcium carbonate (OS-CAL - DOSED IN MG OF ELEMENTAL CALCIUM) 1250 MG tablet Take 500 mg by mouth 3 (three) times daily.      . Chlorpheniramine-APAP (CORICIDIN) 2-325 MG TABS Take 2 tablets by mouth daily  as needed. For allergies      . denosumab (PROLIA) 60 MG/ML SOLN Inject 60 mg into the skin every 6 (six) months. One injection every 6 months Last dose was in October, Next dose due in April      . diphenhydramine-acetaminophen (TYLENOL PM) 25-500 MG TABS Take 1 tablet by mouth at bedtime as needed.      [name] ibuprofen (ADVIL,MOTRIN) 200 MG tablet Take 400 mg by mouth every 6 (six) hours as needed. For headache/pain      . levothyroxine (SYNTHROID, LEVOTHROID) 88 MCG tablet Take 1 tablet (88 mcg total) by mouth daily before breakfast.  90 tablet  1  . lisinopril-hydrochlorothiazide (PRINZIDE,ZESTORETIC) 20-12.5 MG per tablet Take 1 tablet by mouth daily.  90 tablet  2  . Multiple Vitamin (MULTIVITAMIN) capsule Take 1 capsule by mouth daily.       [name] omeprazole (PRILOSEC) 20 MG capsule Take 20 mg by mouth daily as needed. Heart burn      . OVER THE COUNTER MEDICATION Take 1 tablet by mouth daily. Airborne tablets- when she goes out in public      .  [DISCONTINUED] famotidine (PEPCID) 20 MG tablet Take 20 mg by mouth as needed.        No current facility-administered medications on file prior to visit.      Review of Systems System review is negative for any constitutional, cardiac, pulmonary, GI or neuro symptoms or complaints other than as described in the HPI.     Objective:   Physical Exam Filed Vitals:   03/24/13 1144  BP: 142/100  Pulse: 70  Temp: 97.9 F (36.6 C)   General - WNWD woman in no acute distress Cor- RRR Pulm - CTAP msk - Back exam: normal stand; normal flex to greater than 100 degrees; normal gait; normal toe/heel walk; normal step up to exam table; normal SLR sitting; normal DTRs at the patellar tendons R < L; normal sensation to light touch; no  CVA tenderness; able to move supine to sitting witout assistance. Very tender to deep palpation over the glutues at the location of the SI joint        Assessment & Plan:  Low back pain - on exam there is no radiculopathy (evidence of nerve compression, like from a herniated disk). There is point tenderness in the gluteal muscles over the area of the sacro-iliace joint.   Plan Meloxicam 15 mg once a day (equivalent of 800 mg ibuprofen three times a day but safer)  Heat and massage  Referral to physical therapy.   Try looking at YouTube.com - search words: gluteal strain and stretch

## 2013-03-25 ENCOUNTER — Ambulatory Visit (INDEPENDENT_AMBULATORY_CARE_PROVIDER_SITE_OTHER): Payer: Medicare Other | Admitting: *Deleted

## 2013-03-25 DIAGNOSIS — I442 Atrioventricular block, complete: Secondary | ICD-10-CM

## 2013-03-25 LAB — PACEMAKER DEVICE OBSERVATION
ATRIAL PACING PM: 2
BAMS-0001: 155 {beats}/min
RV LEAD IMPEDENCE PM: 634 Ohm
RV LEAD THRESHOLD: 0.75 V

## 2013-03-25 NOTE — Progress Notes (Signed)
Pacemaker check in clinic. Normal device function. Thresholds, sensing, impedances consistent with previous measurements. Device programmed to maximize longevity.822 mode switches, 0.3% A-fib, the longest 1:02 hours, - coumadin. Compared to her last check 09/02/12 was 434 episodes total 0.5% A-fib and the longest episode 1:23 hours.   No high ventricular rates noted. Device programmed at appropriate safety margins. Histogram distribution appropriate for patient activity level. Device programmed to optimize intrinsic conduction. Estimated longevity 11.5 years.  Patient education completed.  ROV in March with Dr. Graciela Husbands.

## 2013-03-31 ENCOUNTER — Ambulatory Visit: Payer: Medicare Other | Attending: Internal Medicine | Admitting: Rehabilitation

## 2013-03-31 DIAGNOSIS — M25559 Pain in unspecified hip: Secondary | ICD-10-CM | POA: Insufficient documentation

## 2013-03-31 DIAGNOSIS — M545 Low back pain, unspecified: Secondary | ICD-10-CM | POA: Insufficient documentation

## 2013-03-31 DIAGNOSIS — IMO0001 Reserved for inherently not codable concepts without codable children: Secondary | ICD-10-CM | POA: Insufficient documentation

## 2013-04-02 ENCOUNTER — Ambulatory Visit: Payer: Medicare Other | Admitting: Rehabilitation

## 2013-04-05 ENCOUNTER — Ambulatory Visit: Payer: Medicare Other | Admitting: Rehabilitation

## 2013-04-08 ENCOUNTER — Encounter: Payer: Self-pay | Admitting: Internal Medicine

## 2013-04-08 ENCOUNTER — Ambulatory Visit: Payer: Medicare Other | Admitting: Rehabilitation

## 2013-04-12 ENCOUNTER — Ambulatory Visit: Payer: Medicare Other | Admitting: Rehabilitation

## 2013-04-15 ENCOUNTER — Ambulatory Visit: Payer: Medicare Other | Admitting: Rehabilitation

## 2013-05-04 ENCOUNTER — Ambulatory Visit: Payer: Medicare Other | Admitting: Rehabilitation

## 2013-05-06 ENCOUNTER — Ambulatory Visit: Payer: Medicare Other | Admitting: Rehabilitation

## 2013-05-10 ENCOUNTER — Ambulatory Visit: Payer: Medicare Other | Admitting: Rehabilitation

## 2013-05-28 ENCOUNTER — Encounter: Payer: Self-pay | Admitting: Internal Medicine

## 2013-05-28 ENCOUNTER — Telehealth: Payer: Self-pay

## 2013-05-28 DIAGNOSIS — Z1239 Encounter for other screening for malignant neoplasm of breast: Secondary | ICD-10-CM

## 2013-05-28 NOTE — Telephone Encounter (Signed)
Phone call to patient letting her know she is due for a Prevnar vaccine. She agreed and was transferred to scheduling for an appt.

## 2013-06-02 ENCOUNTER — Ambulatory Visit (INDEPENDENT_AMBULATORY_CARE_PROVIDER_SITE_OTHER): Payer: Medicare Other

## 2013-06-02 DIAGNOSIS — Z23 Encounter for immunization: Secondary | ICD-10-CM

## 2013-07-08 ENCOUNTER — Ambulatory Visit (INDEPENDENT_AMBULATORY_CARE_PROVIDER_SITE_OTHER): Payer: Medicare Other | Admitting: Internal Medicine

## 2013-07-08 ENCOUNTER — Telehealth: Payer: Self-pay

## 2013-07-08 ENCOUNTER — Encounter: Payer: Self-pay | Admitting: Internal Medicine

## 2013-07-08 ENCOUNTER — Ambulatory Visit: Payer: Medicare Other | Admitting: Nurse Practitioner

## 2013-07-08 VITALS — BP 174/102 | HR 74 | Temp 96.8°F | Wt 138.0 lb

## 2013-07-08 DIAGNOSIS — B029 Zoster without complications: Secondary | ICD-10-CM

## 2013-07-08 DIAGNOSIS — I10 Essential (primary) hypertension: Secondary | ICD-10-CM

## 2013-07-08 DIAGNOSIS — D179 Benign lipomatous neoplasm, unspecified: Secondary | ICD-10-CM

## 2013-07-08 MED ORDER — TRAMADOL HCL 50 MG PO TABS: 50.0000 mg | ORAL_TABLET | Freq: Three times a day (TID) | ORAL | Status: DC | PRN

## 2013-07-08 MED ORDER — VALACYCLOVIR HCL 1 G PO TABS: 1000.0000 mg | ORAL_TABLET | Freq: Three times a day (TID) | ORAL | Status: DC

## 2013-07-08 MED ORDER — PREDNISONE 20 MG PO TABS: 20.0000 mg | ORAL_TABLET | Freq: Every day | ORAL | Status: DC

## 2013-07-08 MED ORDER — ACYCLOVIR 800 MG PO TABS: 800.0000 mg | ORAL_TABLET | Freq: Every day | ORAL | Status: DC

## 2013-07-08 NOTE — Telephone Encounter (Signed)
Received a fax from [facility] on [address] in [facility] stating patient is requesting changes from Valacyclovir ($200) to Acyclovir (which will be a lot less). Please advise.

## 2013-07-08 NOTE — Assessment & Plan Note (Signed)
BP Readings from Last 3 Encounters:  07/08/13 174/102  03/24/13 142/100  12/28/12 164/100   Plan - home monitoring and change of med if persistently elevated

## 2013-07-08 NOTE — Progress Notes (Signed)
Subjective:    Patient ID: [name], female    DOB: 02/05/1941, 73 y.o.   MRN: [id number]  HPI [name] awoke with a painful rash on the left buttock. She has otherwise been doing OK.  Past Medical History  Diagnosis Date  . Atrioventricular block, complete     a. 08/2011 Upgrage of PPM to MDT Adapta L Dual Chamber PPM ser # [id number] H.  . Diastolic dysfunction   . HTN (hypertension)   . Acute neck pain   . Tension headache   . Multinodular goiter   . Contact dermatitis and eczema     unspec cause  . Tooth abscess   . Thyroid nodule   . Osteoporosis   . GERD (gastroesophageal reflux disease)   . Depression   . Lung nodule   . Superficial thrombophlebitis   . Hypothyroidism   . Acute sinusitis   . Mitral regurgitation     a.03/2012 Echo: EF 65-70%, mod MR  . Chest pain     a. 2007-10-21 Myoview: EF 77%, no ischemia/infarct.  [name] PONV (postoperative nausea and vomiting)   . Pacemaker    Past Surgical History  Procedure Laterality Date  . Laporatomy for ruptured diverticulum with colostomy  2001-10-20  . Take down of colostomy  10/21/02  . Exploratory laparotomy    . Medtronic pacemaker  05/2003  . Hernia repair    . Colon surgery    . Thyroidectomy N/A 08/07/2012    Procedure: THYROIDECTOMY;  Surgeon: Earnstine Regal, MD;  Location: [facility];  Service: General;  Laterality: N/A;  . Colonoscopy  01/19/2013    colonoscopy   Family History  Problem Relation Age of Onset  . Heart disease Mother   . Heart failure Father   . Diverticulosis Sister   . Hypertension Brother   . Hypertension Brother   . Cancer Son     breast   History   Social History  . Marital Status: Single    Spouse Name: N/A    Number of Children: 2  . Years of Education: 16   Occupational History  . Retired Production assistant, radio for the [employer]   .     Social History Main Topics  . Smoking status: Former Research scientist (life sciences)  . Smokeless tobacco: Never Used     Comment: quit 30 years ago  . Alcohol Use:  No  . Drug Use: No  . Sexual Activity: No   Other Topics Concern  . Not on file   Social History Narrative   The patient is a Writer of [employer].  She worked for the [employer] in [employer].  She was married for 22 years, divorced, and has remained single. She has 1 grown son, 1 grown daughter.  She has 3 grandchildren, 2 of her children   live in [place].  She is retired.  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    Current Outpatient Prescriptions on File Prior to Visit  Medication Sig Dispense Refill  . calcium carbonate (OS-CAL - DOSED IN MG OF ELEMENTAL CALCIUM) 1250 MG tablet Take 500 mg by mouth 3 (three) times daily.      . Chlorpheniramine-APAP (CORICIDIN) 2-325 MG TABS Take 2 tablets by mouth daily as needed. For allergies      . denosumab (PROLIA) 60 MG/ML SOLN Inject 60 mg into the skin every  6 (six) months. One injection every 6 months Last dose was in October, Next dose due in April      . diphenhydramine-acetaminophen (TYLENOL PM) 25-500 MG TABS Take 1 tablet by mouth at bedtime as needed.      [name] ibuprofen (ADVIL,MOTRIN) 200 MG tablet Take 400 mg by mouth every 6 (six) hours as needed. For headache/pain      . levothyroxine (SYNTHROID, LEVOTHROID) 88 MCG tablet Take 1 tablet (88 mcg total) by mouth daily before breakfast.  90 tablet  1  . lisinopril-hydrochlorothiazide (PRINZIDE,ZESTORETIC) 20-12.5 MG per tablet Take 1 tablet by mouth daily.  90 tablet  2  . meloxicam (MOBIC) 15 MG tablet Take 1 tablet (15 mg total) by mouth daily.  30 tablet  1  . Multiple Vitamin (MULTIVITAMIN) capsule Take 1 capsule by mouth daily.       [name] omeprazole (PRILOSEC) 20 MG capsule Take 20 mg by mouth daily as needed. Heart burn      . OVER THE COUNTER MEDICATION Take 1 tablet by mouth daily. Airborne tablets- when she goes out in public      . [DISCONTINUED] famotidine (PEPCID)  20 MG tablet Take 20 mg by mouth as needed.        No current facility-administered medications on file prior to visit.      Review of Systems System review is negative for any constitutional, cardiac, pulmonary, GI or neuro symptoms or complaints other than as described in the HPI.     Objective:   Physical Exam Filed Vitals:   07/08/13 1127  BP: 174/102  Pulse: 74  Temp: 96.8 F (36 C)   BP Readings from Last 3 Encounters:  07/08/13 174/102  03/24/13 142/100  12/28/12 164/100   gen'l - WNWD woman Cor - RRR Pulm- normal respirations Derm - erythematous rash left buttock - one lesion 2 cm with blister like appearance, two erythematous macular lesion nearby Large soft tissue mass posterior Left proximal UE.       Assessment & Plan:  Shingles  - early detection - just broke out this AM Plan  Valacycylovir 1,000 mg three times a day x 7 days  Prednisone 20 mg once a day for 7 days  Tramadol 50 mg 1 or 2 every 8 hours for pain  [place] soaks - to ease pain and help dry up lesions  Addendum - due to cost changed to acyclovir 800 mg 5/day x 7 days

## 2013-07-08 NOTE — Progress Notes (Signed)
Pre visit review using our clinic review tool, if applicable. No additional management support is needed unless otherwise documented below in the visit note. 

## 2013-07-08 NOTE — Patient Instructions (Signed)
Shingles  - early detection - just broke out this AM Plan  Valacycylovir 1,000 mg three times a day x 7 days  Prednisone 20 mg once a day for 7 days  Tramadol 50 mg 1 or 2 every 8 hours for pain  [place] soaks - to ease pain and help dry up lesions  Blood pressure -  BP Readings from Last 3 Encounters:  07/08/13 174/102  03/24/13 142/100  12/28/12 164/100   Plan Monitor at home and report back by MyChart   Lipoma - growing.  Plan Watchful waiting vs CT w/ contrast to insure it is, in fact, a simple lipoma and not a liposarcoma.     Shingles Shingles (herpes zoster) is an infection that is caused by the same virus that causes chickenpox (varicella). The infection causes a painful skin rash and fluid-filled blisters, which eventually break open, crust over, and heal. It may occur in any area of the body, but it usually affects only one side of the body or face. The pain of shingles usually lasts about 1 month. However, some people with shingles may develop long-term (chronic) pain in the affected area of the body. Shingles often occurs many years after the person had chickenpox. It is more common:  In people older than 50 years.  In people with weakened immune systems, such as those with HIV, AIDS, or cancer.  In people taking medicines that weaken the immune system, such as transplant medicines.  In people under great stress. CAUSES  Shingles is caused by the varicella zoster virus (VZV), which also causes chickenpox. After a person is infected with the virus, it can remain in the person's body for years in an inactive state (dormant). To cause shingles, the virus reactivates and breaks out as an infection in a nerve root. The virus can be spread from person to person (contagious) through contact with open blisters of the shingles rash. It will only spread to people who have not had chickenpox. When these people are exposed to the virus, they may develop chickenpox. They will not  develop shingles. Once the blisters scab over, the person is no longer contagious and cannot spread the virus to others. SYMPTOMS  Shingles shows up in stages. The initial symptoms may be pain, itching, and tingling in an area of the skin. This pain is usually described as burning, stabbing, or throbbing.In a few days or weeks, a painful red rash will appear in the area where the pain, itching, and tingling were felt. The rash is usually on one side of the body in a band or belt-like pattern. Then, the rash usually turns into fluid-filled blisters. They will scab over and dry up in approximately 2 3 weeks. Flu-like symptoms may also occur with the initial symptoms, the rash, or the blisters. These may include:  Fever.  Chills.  Headache.  Upset stomach. DIAGNOSIS  Your caregiver will perform a skin exam to diagnose shingles. Skin scrapings or fluid samples may also be taken from the blisters. This sample will be examined under a microscope or sent to a lab for further testing. TREATMENT  There is no specific cure for shingles. Your caregiver will likely prescribe medicines to help you manage the pain, recover faster, and avoid long-term problems. This may include antiviral drugs, anti-inflammatory drugs, and pain medicines. HOME CARE INSTRUCTIONS   Take a cool bath or apply cool compresses to the area of the rash or blisters as directed. This may help with the pain and itching.  Only take over-the-counter or prescription medicines as directed by your caregiver.   Rest as directed by your caregiver.  Keep your rash and blisters clean with mild soap and cool water or as directed by your caregiver.  Do not pick your blisters or scratch your rash. Apply an anti-itch cream or numbing creams to the affected area as directed by your caregiver.  Keep your shingles rash covered with a loose bandage (dressing).  Avoid skin contact with:  Babies.   Pregnant women.   Children with  eczema.   Elderly people with transplants.   People with chronic illnesses, such as leukemia or AIDS.   Wear loose-fitting clothing to help ease the pain of material rubbing against the rash.  Keep all follow-up appointments with your caregiver.If the area involved is on your face, you may receive a referral for follow-up to a specialist, such as an eye doctor (ophthalmologist) or an ear, nose, and throat (ENT) doctor. Keeping all follow-up appointments will help you avoid eye complications, chronic pain, or disability.  SEEK IMMEDIATE MEDICAL CARE IF:   You have facial pain, pain around the eye area, or loss of feeling on one side of your face.  You have ear pain or ringing in your ear.  You have loss of taste.  Your pain is not relieved with prescribed medicines.   Your redness or swelling spreads.   You have more pain and swelling.  Your condition is worsening or has changed.   You have a feveror persistent symptoms for more than 2 3 days.  You have a fever and your symptoms suddenly get worse. MAKE SURE YOU:  Understand these instructions.  Will watch your condition.  Will get help right away if you are not doing well or get worse. Document Released: 06/17/2005 Document Revised: 03/11/2012 Document Reviewed: 01/30/2012 [facility] Patient Information 2014 [place].

## 2013-07-08 NOTE — Assessment & Plan Note (Signed)
Large lipoma left posterior proximal UE - increase size = peach size.  Plan Watchful waiting vs CT soft tissue with contrast.

## 2013-07-08 NOTE — Telephone Encounter (Signed)
Sent in new Rx acyclovir 800 mg 5 times a day x 7 days

## 2013-07-14 ENCOUNTER — Telehealth: Payer: Self-pay | Admitting: Internal Medicine

## 2013-07-14 DIAGNOSIS — E890 Postprocedural hypothyroidism: Secondary | ICD-10-CM

## 2013-07-14 MED ORDER — LEVOTHYROXINE SODIUM 88 MCG PO TABS: 88.0000 ug | ORAL_TABLET | Freq: Every day | ORAL | Status: DC

## 2013-07-14 NOTE — Telephone Encounter (Signed)
Patient called stating she need a 90 days supply for levothyroxine (SYNTHROID, LEVOTHROID) 88 MCG tablet  Stated it was called in as 30 day supply Please advise

## 2013-07-20 ENCOUNTER — Ambulatory Visit (INDEPENDENT_AMBULATORY_CARE_PROVIDER_SITE_OTHER): Payer: Medicare Other

## 2013-07-20 VITALS — BP 160/92

## 2013-07-20 DIAGNOSIS — I10 Essential (primary) hypertension: Secondary | ICD-10-CM

## 2013-07-20 NOTE — Progress Notes (Signed)
Patient blood pressure was also take with her home machine and it read 166/70. Dr Linda Hedges did go in and talk to [place] before she left from her nurse visit.

## 2013-08-17 ENCOUNTER — Encounter: Payer: Self-pay | Admitting: Internal Medicine

## 2013-08-17 ENCOUNTER — Other Ambulatory Visit: Payer: Self-pay | Admitting: Internal Medicine

## 2013-08-18 MED ORDER — MELOXICAM 15 MG PO TABS: ORAL_TABLET | ORAL | Status: DC

## 2013-09-01 ENCOUNTER — Emergency Department (HOSPITAL_BASED_OUTPATIENT_CLINIC_OR_DEPARTMENT_OTHER): Payer: Medicare Other

## 2013-09-01 ENCOUNTER — Encounter (HOSPITAL_BASED_OUTPATIENT_CLINIC_OR_DEPARTMENT_OTHER): Payer: Self-pay | Admitting: Emergency Medicine

## 2013-09-01 ENCOUNTER — Emergency Department (HOSPITAL_BASED_OUTPATIENT_CLINIC_OR_DEPARTMENT_OTHER)
Admission: EM | Admit: 2013-09-01 | Discharge: 2013-09-01 | Disposition: A | Discharge status: Home / Self Care | Payer: Medicare Other | Attending: Emergency Medicine | Admitting: Emergency Medicine

## 2013-09-01 DIAGNOSIS — M546 Pain in thoracic spine: Secondary | ICD-10-CM | POA: Insufficient documentation

## 2013-09-01 DIAGNOSIS — Z791 Long term (current) use of non-steroidal anti-inflammatories (NSAID): Secondary | ICD-10-CM | POA: Insufficient documentation

## 2013-09-01 DIAGNOSIS — Z87891 Personal history of nicotine dependence: Secondary | ICD-10-CM | POA: Insufficient documentation

## 2013-09-01 DIAGNOSIS — M810 Age-related osteoporosis without current pathological fracture: Secondary | ICD-10-CM | POA: Insufficient documentation

## 2013-09-01 DIAGNOSIS — Z8709 Personal history of other diseases of the respiratory system: Secondary | ICD-10-CM | POA: Insufficient documentation

## 2013-09-01 DIAGNOSIS — Z950 Presence of cardiac pacemaker: Secondary | ICD-10-CM | POA: Insufficient documentation

## 2013-09-01 DIAGNOSIS — M549 Dorsalgia, unspecified: Secondary | ICD-10-CM

## 2013-09-01 DIAGNOSIS — Z8672 Personal history of thrombophlebitis: Secondary | ICD-10-CM | POA: Insufficient documentation

## 2013-09-01 DIAGNOSIS — Z872 Personal history of diseases of the skin and subcutaneous tissue: Secondary | ICD-10-CM | POA: Insufficient documentation

## 2013-09-01 DIAGNOSIS — F3289 Other specified depressive episodes: Secondary | ICD-10-CM | POA: Insufficient documentation

## 2013-09-01 DIAGNOSIS — F329 Major depressive disorder, single episode, unspecified: Secondary | ICD-10-CM | POA: Insufficient documentation

## 2013-09-01 DIAGNOSIS — I10 Essential (primary) hypertension: Secondary | ICD-10-CM | POA: Insufficient documentation

## 2013-09-01 DIAGNOSIS — E039 Hypothyroidism, unspecified: Secondary | ICD-10-CM | POA: Insufficient documentation

## 2013-09-01 DIAGNOSIS — K219 Gastro-esophageal reflux disease without esophagitis: Secondary | ICD-10-CM | POA: Insufficient documentation

## 2013-09-01 DIAGNOSIS — R0602 Shortness of breath: Secondary | ICD-10-CM | POA: Insufficient documentation

## 2013-09-01 DIAGNOSIS — IMO0002 Reserved for concepts with insufficient information to code with codable children: Secondary | ICD-10-CM | POA: Insufficient documentation

## 2013-09-01 DIAGNOSIS — M5430 Sciatica, unspecified side: Secondary | ICD-10-CM | POA: Insufficient documentation

## 2013-09-01 DIAGNOSIS — Z79899 Other long term (current) drug therapy: Secondary | ICD-10-CM | POA: Insufficient documentation

## 2013-09-01 MED ORDER — OXYCODONE-ACETAMINOPHEN 5-325 MG PO TABS: 2.0000 | ORAL_TABLET | ORAL | Status: DC | PRN

## 2013-09-01 MED ORDER — ONDANSETRON 4 MG PO TBDP: 4.0000 mg | ORAL_TABLET | Freq: Three times a day (TID) | ORAL | Status: DC | PRN

## 2013-09-01 MED ORDER — OXYCODONE-ACETAMINOPHEN 5-325 MG PO TABS
2.0000 | ORAL_TABLET | Freq: Once | ORAL | Status: AC
  Administered 2013-09-01: 2 via ORAL
  Filled 2013-09-01: qty 2

## 2013-09-01 MED ORDER — ONDANSETRON 4 MG PO TBDP
4.0000 mg | ORAL_TABLET | Freq: Once | ORAL | Status: AC
  Administered 2013-09-01: 4 mg via ORAL
  Filled 2013-09-01: qty 1

## 2013-09-01 MED ORDER — NAPROXEN 500 MG PO TABS: 500.0000 mg | ORAL_TABLET | Freq: Two times a day (BID) | ORAL | Status: DC

## 2013-09-01 NOTE — ED Provider Notes (Signed)
CSN: [id number]     Arrival date & time 09/01/13  1621 History  This chart was scribed for Daisy Furry, MD by Einar Pheasant, ED Scribe. This patient was seen in room MH10/MH10 and the patient's care was started at 7:10 PM.    Chief Complaint  Patient presents with  . Back Pain    The history is provided by the patient. No language interpreter was used.   HPI Comments: [name] is a 73 y.o. female with a history of osteoporosis, presents to the Emergency Department complaining of worsening mid back pain that started 2 days ago. She states that the pain started on her right side and she thought that it was due to muscle tightness, so she did a couple of stretches and went to sleep. However, upon waking the pain had not subsided it worsened. She states that she contemplated taking a formally prescribed pain medication. Pt states that the pain radiates down through her ribs. She is also complaining of associated SOB, and abdominal soreness. Pt reports taking Maloxicam for her sciatica that she experienced some time ago, she states that the sciatica has resolved. She says she has experienced back pain before but it has never been this bad. Denies any pain with deep breathing, recent injuries, leg pain, numbness, bladder or bowel incontinence  Past Medical History  Diagnosis Date  . Atrioventricular block, complete     a. 08/2011 Upgrage of PPM to MDT Adapta L Dual Chamber PPM ser # [id number] H.  . Diastolic dysfunction   . HTN (hypertension)   . Acute neck pain   . Tension headache   . Multinodular goiter   . Contact dermatitis and eczema     unspec cause  . Tooth abscess   . Thyroid nodule   . Osteoporosis   . GERD (gastroesophageal reflux disease)   . Depression   . Lung nodule   . Superficial thrombophlebitis   . Hypothyroidism   . Acute sinusitis   . Mitral regurgitation     a.03/2012 Echo: EF 65-70%, mod MR  . Chest pain     a. 09/2007 Myoview: EF 77%, no ischemia/infarct.  [name]  PONV (postoperative nausea and vomiting)   . Pacemaker    Past Surgical History  Procedure Laterality Date  . Laporatomy for ruptured diverticulum with colostomy  2003  . Take down of colostomy  2004  . Exploratory laparotomy    . Medtronic pacemaker  05/2003  . Hernia repair    . Colon surgery    . Thyroidectomy N/A 08/07/2012    Procedure: THYROIDECTOMY;  Surgeon: Earnstine Regal, MD;  Location: [facility];  Service: General;  Laterality: N/A;  . Colonoscopy  01/19/2013    colonoscopy   Family History  Problem Relation Age of Onset  . Heart disease Mother   . Heart failure Father   . Diverticulosis Sister   . Hypertension Brother   . Hypertension Brother   . Cancer Son     breast   History  Substance Use Topics  . Smoking status: Former Research scientist (life sciences)  . Smokeless tobacco: Never Used     Comment: quit 30 years ago  . Alcohol Use: No   OB History   Grav Para Term Preterm Abortions TAB SAB Ect Mult Living                 Review of Systems  Constitutional: Negative for chills, diaphoresis, appetite change and fatigue.  HENT: Negative for mouth sores, sore throat  and trouble swallowing.   Eyes: Negative for visual disturbance.  Respiratory: Positive for shortness of breath (secondary to pain). Negative for cough, chest tightness and wheezing.   Gastrointestinal: Positive for abdominal pain (abdominal soreness). Negative for nausea, vomiting, diarrhea and abdominal distention.  Endocrine: Negative for polydipsia, polyphagia and polyuria.  Genitourinary: Negative for frequency and hematuria.  Musculoskeletal: Positive for back pain. Negative for gait problem.  Skin: Negative for color change, pallor and rash.  Neurological: Negative for dizziness, syncope and light-headedness.  Hematological: Does not bruise/bleed easily.  Psychiatric/Behavioral: Negative for behavioral problems and confusion.      Allergies  Tape and Codeine  Home Medications   Current Outpatient Rx  Name   Route  Sig  Dispense  Refill  . acyclovir (ZOVIRAX) 800 MG tablet   Oral   Take 1 tablet (800 mg total) by mouth 5 (five) times daily.   35 tablet   0   . calcium carbonate (OS-CAL - DOSED IN MG OF ELEMENTAL CALCIUM) 1250 MG tablet   Oral   Take 500 mg by mouth 3 (three) times daily.         . Chlorpheniramine-APAP (CORICIDIN) 2-325 MG TABS   Oral   Take 2 tablets by mouth daily as needed. For allergies         . denosumab (PROLIA) 60 MG/ML SOLN   Subcutaneous   Inject 60 mg into the skin every 6 (six) months. One injection every 6 months Last dose was in October, Next dose due in April         . diphenhydramine-acetaminophen (TYLENOL PM) 25-500 MG TABS   Oral   Take 1 tablet by mouth at bedtime as needed.         [name] ibuprofen (ADVIL,MOTRIN) 200 MG tablet   Oral   Take 400 mg by mouth every 6 (six) hours as needed. For headache/pain         . levothyroxine (SYNTHROID, LEVOTHROID) 88 MCG tablet   Oral   Take 1 tablet (88 mcg total) by mouth daily before breakfast.   90 tablet   3   . lisinopril-hydrochlorothiazide (PRINZIDE,ZESTORETIC) 20-12.5 MG per tablet   Oral   Take 1 tablet by mouth daily.   90 tablet   2   . meloxicam (MOBIC) 15 MG tablet      TAKE ONE TABLET BY MOUTH ONCE DAILY   30 tablet   5   . Multiple Vitamin (MULTIVITAMIN) capsule   Oral   Take 1 capsule by mouth daily.          . naproxen (NAPROSYN) 500 MG tablet   Oral   Take 1 tablet (500 mg total) by mouth 2 (two) times daily.   30 tablet   0   . omeprazole (PRILOSEC) 20 MG capsule   Oral   Take 20 mg by mouth daily as needed. Heart burn         . ondansetron (ZOFRAN ODT) 4 MG disintegrating tablet   Oral   Take 1 tablet (4 mg total) by mouth every 8 (eight) hours as needed for nausea.   20 tablet   0   . OVER THE COUNTER MEDICATION   Oral   Take 1 tablet by mouth daily. Airborne tablets- when she goes out in public         . oxyCODONE-acetaminophen  (PERCOCET/ROXICET) 5-325 MG per tablet   Oral   Take 2 tablets by mouth every 4 (four) hours as needed.  6 tablet   0   . predniSONE (DELTASONE) 20 MG tablet   Oral   Take 1 tablet (20 mg total) by mouth daily with breakfast.   7 tablet   0   . traMADol (ULTRAM) 50 MG tablet   Oral   Take 1 tablet (50 mg total) by mouth every 8 (eight) hours as needed. May take two at a time if needed.   60 tablet   2   . valACYclovir (VALTREX) 1000 MG tablet   Oral   Take 1 tablet (1,000 mg total) by mouth 3 (three) times daily.   21 tablet   0    Triage Vitals: BP 184/76  Temp(Src) 97.6 F (36.4 C) (Oral)  Resp 18  Ht 5' 5.5" (1.664 m)  Wt 142 lb (64.411 kg)  BMI 23.26 kg/m2  SpO2 98%  Physical Exam  Constitutional: She is oriented to person, place, and time. She appears well-developed and well-nourished. No distress.  HENT:  Head: Normocephalic.  Right Ear: External ear normal.  Left Ear: External ear normal.  Nose: Nose normal.  Mouth/Throat: Oropharynx is clear and moist.  Eyes: Conjunctivae are normal. Pupils are equal, round, and reactive to light. No scleral icterus.  Neck: Normal range of motion. Neck supple. No thyromegaly present.  Cardiovascular: Normal rate, regular rhythm and normal heart sounds.  Exam reveals no gallop and no friction rub.   No murmur heard. Pulmonary/Chest: Effort normal and breath sounds normal. No respiratory distress. She has no wheezes. She has no rales.  Abdominal: Soft. Bowel sounds are normal. She exhibits no distension and no mass. There is no tenderness. There is no rebound.  Musculoskeletal: Normal range of motion.  Bilateral paraspinal pain from mid scapula to sacrum.   Neurological: She is alert and oriented to person, place, and time. She has normal strength and normal reflexes. No cranial nerve deficit or sensory deficit.  Skin: Skin is warm and dry. No rash noted.  Psychiatric: She has a normal mood and affect. Her behavior is  normal.    ED Course  Procedures (including critical care time)  DIAGNOSTIC STUDIES: Oxygen Saturation is 98% on RA, normal by my interpretation.    COORDINATION OF CARE: 7:21 PM- Will order pain and nausea medication. Pt advised of plan for treatment and pt agrees.  Medications  oxyCODONE-acetaminophen (PERCOCET/ROXICET) 5-325 MG per tablet 2 tablet (2 tablets Oral Given 09/01/13 1926)  ondansetron (ZOFRAN-ODT) disintegrating tablet 4 mg (4 mg Oral Given 09/01/13 1926)    Labs Review Labs Reviewed - No data to display Imaging Review Dg Thoracic Spine 2 View  09/01/2013   CLINICAL DATA:  Sudden onset of severe pain in the mid back.  EXAM: THORACIC SPINE - 2 VIEW  COMPARISON:  Two-view chest x-ray 12/18/2012.  FINDINGS: Surgical clips are present in the neck. Cervical thoracic junction is within normal limits. Twelve thoracic type vertebral bodies are present. The vertebral body heights and alignment are maintained. Mild endplate degenerative changes are noted. Pacing wires are in place.  IMPRESSION: Mild degenerative changes within the upper thoracic spine.  No acute abnormality.   Electronically Signed   By: Lawrence Santiago M.D.   On: 09/01/2013 20:12   Dg Lumbar Spine Complete  09/01/2013   CLINICAL DATA:  Sudden onset of severe back pain.  EXAM: LUMBAR SPINE - COMPLETE 4+ VIEW  COMPARISON:  Lumbar spine radiographs 10/04/2006.  FINDINGS: The ventral hernia repair is again noted. 5 non rib-bearing lumbar type vertebral bodies are  present. Mild degenerative changes in the lower lumbar facets are stable. Vertebral height and alignment is maintained. No acute fracture or traumatic subluxation is present. There is chronic loss of disc height at L3-4, L4-5, and L5-S1.  IMPRESSION: 1. 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 2. No acute abnormality.   Electronically Signed   By: Lawrence Santiago M.D.   On: 09/01/2013 20:14     EKG Interpretation None      MDM   Final diagnoses:   Back pain     no compressions noted on plain film x-rays. Discussion;  she is no anterior chest pain. She has an intra-abdominal pain. This is clearly reproducible with movement. She is not febrile. She's not hypoxemic. She is not short of breath. I do not feel she needs additional testing at this time. She had good symptom control with the meds as above. If she develops chest pain, shortness of breath, abdominal pain, fever, or other new or worsening symptoms I have asked her to be reevaluated here or with her primary care physician.  I personally performed the services described in this documentation, which was scribed in my presence. The recorded information has been reviewed and is accurate.    Daisy Furry, MD 09/01/13 2025

## 2013-09-01 NOTE — ED Notes (Addendum)
Pt became nauseated, reports some lower chest pain EKG performed and given to MD.

## 2013-09-01 NOTE — ED Notes (Signed)
Pt reports mid back pain radiating around her ribs and down into her abdomen.  Started on Monday.  Denies injury. Pt SOB from the pain, denies pain with deep inspiration.  Movement increases pain.

## 2013-09-01 NOTE — Discharge Instructions (Signed)
Your x-rays do not show any compression fractures of your spine. Recheck here if you develop shortness of breath, fever, worsening pain, anterior chest or abdominal pain, or other new or worsening symptoms. Stop taking Melxicam while you take Naproxen.  Back Pain, Adult Low back pain is very common. About 1 in 5 people have back pain.The cause of low back pain is rarely dangerous. The pain often gets better over time.About half of people with a sudden onset of back pain feel better in just 2 weeks. About 8 in 10 people feel better by 6 weeks.  CAUSES Some common causes of back pain include:  Strain of the muscles or ligaments supporting the spine.  Wear and tear (degeneration) of the spinal discs.  Arthritis.  Direct injury to the back. DIAGNOSIS Most of the time, the direct cause of low back pain is not known.However, back pain can be treated effectively even when the exact cause of the pain is unknown.Answering your caregiver's questions about your overall health and symptoms is one of the most accurate ways to make sure the cause of your pain is not dangerous. If your caregiver needs more information, he or she may order lab work or imaging tests (X-rays or MRIs).However, even if imaging tests show changes in your back, this usually does not require surgery. HOME CARE INSTRUCTIONS For many people, back pain returns.Since low back pain is rarely dangerous, it is often a condition that people can learn to [facility] their own.   Remain active. It is stressful on the back to sit or stand in one place. Do not sit, drive, or stand in one place for more than 30 minutes at a time. Take short walks on level surfaces as soon as pain allows.Try to increase the length of time you walk each day.  Do not stay in bed.Resting more than 1 or 2 days can delay your recovery.  Do not avoid exercise or work.Your body is made to move.It is not dangerous to be active, even though your back may  hurt.Your back will likely heal faster if you return to being active before your pain is gone.  Pay attention to your body when you bend and lift. Many people have less discomfortwhen lifting if they bend their knees, keep the load close to their bodies,and avoid twisting. Often, the most comfortable positions are those that put less stress on your recovering back.  Find a comfortable position to sleep. Use a firm mattress and lie on your side with your knees slightly bent. If you lie on your back, put a pillow under your knees.  Only take over-the-counter or prescription medicines as directed by your caregiver. Over-the-counter medicines to reduce pain and inflammation are often the most helpful.Your caregiver may prescribe muscle relaxant drugs.These medicines help dull your pain so you can more quickly return to your normal activities and healthy exercise.  Put ice on the injured area.  Put ice in a plastic bag.  Place a towel between your skin and the bag.  Leave the ice on for 15-20 minutes, 03-04 times a day for the first 2 to 3 days. After that, ice and heat may be alternated to reduce pain and spasms.  Ask your caregiver about trying back exercises and gentle massage. This may be of some benefit.  Avoid feeling anxious or stressed.Stress increases muscle tension and can worsen back pain.It is important to recognize when you are anxious or stressed and learn ways to manage it.Exercise is a great  option. SEEK MEDICAL CARE IF:  You have pain that is not relieved with rest or medicine.  You have pain that does not improve in 1 week.  You have new symptoms.  You are generally not feeling well. SEEK IMMEDIATE MEDICAL CARE IF:   You have pain that radiates from your back into your legs.  You develop new bowel or bladder control problems.  You have unusual weakness or numbness in your arms or legs.  You develop nausea or vomiting.  You develop abdominal pain.  You  feel faint. Document Released: 06/17/2005 Document Revised: 12/17/2011 Document Reviewed: 11/05/2010 [facility] Patient Information 2014 [place], [place].

## 2013-09-03 ENCOUNTER — Ambulatory Visit (INDEPENDENT_AMBULATORY_CARE_PROVIDER_SITE_OTHER)
Admission: RE | Admit: 2013-09-03 | Discharge: 2013-09-03 | Disposition: A | Discharge status: Home / Self Care | Payer: Medicare Other | Source: Ambulatory Visit | Attending: Internal Medicine | Admitting: Internal Medicine

## 2013-09-03 ENCOUNTER — Other Ambulatory Visit: Payer: Self-pay | Admitting: Internal Medicine

## 2013-09-03 ENCOUNTER — Encounter: Payer: Self-pay | Admitting: Internal Medicine

## 2013-09-03 ENCOUNTER — Other Ambulatory Visit (INDEPENDENT_AMBULATORY_CARE_PROVIDER_SITE_OTHER): Payer: Medicare Other

## 2013-09-03 ENCOUNTER — Ambulatory Visit (INDEPENDENT_AMBULATORY_CARE_PROVIDER_SITE_OTHER): Payer: Medicare Other | Admitting: Internal Medicine

## 2013-09-03 VITALS — BP 126/80 | HR 81 | Temp 96.8°F | Wt 144.4 lb

## 2013-09-03 DIAGNOSIS — R1013 Epigastric pain: Secondary | ICD-10-CM

## 2013-09-03 DIAGNOSIS — M546 Pain in thoracic spine: Secondary | ICD-10-CM

## 2013-09-03 LAB — HEPATIC FUNCTION PANEL
ALT: 15 U/L (ref 0–35)
AST: 20 U/L (ref 0–37)
Albumin: 4.4 g/dL (ref 3.5–5.2)
Alkaline Phosphatase: 73 U/L (ref 39–117)
Bilirubin, Direct: 0.1 mg/dL (ref 0.0–0.3)
Total Bilirubin: 1 mg/dL (ref 0.3–1.2)
Total Protein: 7.2 g/dL (ref 6.0–8.3)

## 2013-09-03 LAB — BASIC METABOLIC PANEL
BUN: 23 mg/dL (ref 6–23)
CALCIUM: 10.4 mg/dL (ref 8.4–10.5)
CO2: 32 mEq/L (ref 19–32)
Chloride: 101 mEq/L (ref 96–112)
Creatinine, Ser: 1 mg/dL (ref 0.4–1.2)
GFR: 67.93 mL/min (ref >60.00)
Glucose, Bld: 93 mg/dL (ref 70–99)
Potassium: 4.6 mEq/L (ref 3.5–5.1)
SODIUM: 140 meq/L (ref 135–145)

## 2013-09-03 LAB — LIPASE: LIPASE: 71 U/L — AB (ref 11.0–59.0)

## 2013-09-03 LAB — AMYLASE: Amylase: 110 U/L (ref 27–131)

## 2013-09-03 MED ORDER — PROMETHAZINE HCL 12.5 MG PO TABS: 12.5000 mg | ORAL_TABLET | Freq: Four times a day (QID) | ORAL | Status: DC | PRN

## 2013-09-03 MED ORDER — IOHEXOL 300 MG/ML  SOLN: 100.0000 mL | Freq: Once | INTRAMUSCULAR | Status: AC | PRN

## 2013-09-03 NOTE — Patient Instructions (Signed)
Pain at the level of the epigtastrium/thoracic back. It is a bit of a mystery: normal thoracic back films, no tenderness to palpation of the back and no palpable muscle spasm. On exam you are very tender across the epigastrium leading me to suspect a pancreas related problem.  Plan Tylenol 1,000 mg 3 times a day for pain  Continue Meloxicam once a day (preferred over naprosyn)  Ok to use the zofran for nausea - if more medication is needed a Rx for promethazine is provided  Lab: liver functions, pancreatic enzymes - amylase and lipase, metabolic panel  Imaging - CT scan of abdomen with oral and IV contrast: drink the oral contast at 3:00PM

## 2013-09-03 NOTE — Progress Notes (Signed)
Pre visit review using our clinic review tool, if applicable. No additional management support is needed unless otherwise documented below in the visit note. 

## 2013-09-05 ENCOUNTER — Encounter: Payer: Self-pay | Admitting: Internal Medicine

## 2013-09-06 ENCOUNTER — Other Ambulatory Visit: Payer: Self-pay | Admitting: Internal Medicine

## 2013-09-06 MED ORDER — CYCLOBENZAPRINE HCL 5 MG PO TABS: 5.0000 mg | ORAL_TABLET | Freq: Three times a day (TID) | ORAL | Status: DC | PRN

## 2013-09-06 NOTE — Progress Notes (Signed)
Subjective:    Patient ID: [name], female    DOB: [birth year], 73 y.o.   MRN: [id number]  HPI [name] presnts for acute thoracic back pain that began 4 days prior to this visit. The pain became so bad she went to [facility] ED where thoracic and lumbar spine films (reviewed) were negative for compression fracture or any acute problems. Her exam at that time was non-focal. She was d/c home with percocet.  In the interval she found percocet intolerable. She was told to substitute naprosyn for meloxicam but this also was to no avail. She has continued to have pain. She has no paresthesia in the UE, no weakness. The pain radiates from the thoracic spine to both sides. She does not recall any precipitating event or injury. She has had no LE symptoms. She has had no GI complaints except for lack of BM for 4 days.  PMH, FamHx and SocHx reviewed for any changes and relevance.  Current Outpatient Prescriptions on File Prior to Visit  Medication Sig Dispense Refill  . calcium carbonate (OS-CAL - DOSED IN MG OF ELEMENTAL CALCIUM) 1250 MG tablet Take 500 mg by mouth 3 (three) times daily.      . Chlorpheniramine-APAP (CORICIDIN) 2-325 MG TABS Take 2 tablets by mouth daily as needed. For allergies      . denosumab (PROLIA) 60 MG/ML SOLN Inject 60 mg into the skin every 6 (six) months. One injection every 6 months Last dose was in October, Next dose due in April      . diphenhydramine-acetaminophen (TYLENOL PM) 25-500 MG TABS Take 1 tablet by mouth at bedtime as needed.      [name] ibuprofen (ADVIL,MOTRIN) 200 MG tablet Take 400 mg by mouth every 6 (six) hours as needed. For headache/pain      . levothyroxine (SYNTHROID, LEVOTHROID) 88 MCG tablet Take 1 tablet (88 mcg total) by mouth daily before breakfast.  90 tablet  3  . lisinopril-hydrochlorothiazide (PRINZIDE,ZESTORETIC) 20-12.5 MG per tablet Take 1 tablet by mouth daily.  90 tablet  2  . Multiple Vitamin (MULTIVITAMIN) capsule Take 1 capsule by mouth  daily.       . naproxen (NAPROSYN) 500 MG tablet Take 1 tablet (500 mg total) by mouth 2 (two) times daily.  30 tablet  0  . omeprazole (PRILOSEC) 20 MG capsule Take 20 mg by mouth daily as needed. Heart burn      . OVER THE COUNTER MEDICATION Take 1 tablet by mouth daily. Airborne tablets- when she goes out in public      . [DISCONTINUED] famotidine (PEPCID) 20 MG tablet Take 20 mg by mouth as needed.        No current facility-administered medications on file prior to visit.      Review of Systems System review is negative for any constitutional, cardiac, pulmonary, GI or neuro symptoms or complaints other than as described in the HPI.     Objective:   Physical Exam Filed Vitals:   09/03/13 0920  BP: 126/80  Pulse: 81  Temp: 96.8 F (36 C)   Wt Readings from Last 3 Encounters:  09/03/13 144 lb 6.4 oz (65.499 kg)  09/01/13 142 lb (64.411 kg)  07/08/13 138 lb (62.596 kg)   Gen'l- WNWD woman in no acute distress but uncomfortable with movement HEENT - C&S clear Cor - 2+ radial RRR Pulm - normal respirations, no splinting, no rales Abd - hypoactive BS, no HSM, markedly tender in the epigastrium with radiation  of pain to the back. No guarding or rebound. Neuro - Alert and oriented.       Assessment & Plan:  Thoracic back pain - ED evaluation and imaging was unrevealing. She has no radicular findings on exam. She does have marked epigastric pain raising concern for pancreatic origin of pain. No heptaomegaly, no jaundice.  Plan Lab: LFTs, amylase, lipase  Imaging - CT abdomen to r/o pancreatitis or pancreatic mass.  Continue meloxicam. Double the omeprazole  Addendum Lipase mildly elevated, lab otherwise normal   CT abdomen - negative for any pancreatic abnormality.

## 2013-09-14 ENCOUNTER — Encounter: Payer: Self-pay | Admitting: Internal Medicine

## 2013-09-15 ENCOUNTER — Other Ambulatory Visit: Payer: Self-pay | Admitting: Internal Medicine

## 2013-09-15 DIAGNOSIS — M546 Pain in thoracic spine: Secondary | ICD-10-CM

## 2013-09-15 MED ORDER — GABAPENTIN 100 MG PO CAPS: 100.0000 mg | ORAL_CAPSULE | Freq: Three times a day (TID) | ORAL | Status: DC

## 2013-10-05 ENCOUNTER — Encounter: Payer: Self-pay | Admitting: Neurology

## 2013-10-05 ENCOUNTER — Ambulatory Visit (INDEPENDENT_AMBULATORY_CARE_PROVIDER_SITE_OTHER): Payer: Medicare Other | Admitting: Neurology

## 2013-10-05 VITALS — BP 158/92 | HR 80 | Resp 14 | Ht 65.5 in | Wt 146.0 lb

## 2013-10-05 DIAGNOSIS — M25551 Pain in right hip: Secondary | ICD-10-CM

## 2013-10-05 DIAGNOSIS — M549 Dorsalgia, unspecified: Secondary | ICD-10-CM

## 2013-10-05 DIAGNOSIS — M25559 Pain in unspecified hip: Secondary | ICD-10-CM

## 2013-10-05 DIAGNOSIS — M25552 Pain in left hip: Secondary | ICD-10-CM

## 2013-10-05 LAB — SEDIMENTATION RATE: Sed Rate: 4 mm/hr (ref 0–22)

## 2013-10-05 MED ORDER — GABAPENTIN 100 MG PO CAPS: 200.0000 mg | ORAL_CAPSULE | Freq: Three times a day (TID) | ORAL | Status: DC

## 2013-10-05 NOTE — Patient Instructions (Addendum)
At this point, I cannot point to a specific nerve problem.  I want to first check for any systemic inflammatory process so we will test for those (sed rate, ANA). Please go to [facility] on the first floor of this building before leaving the office today. In the meantime, increase the gabapentin 100mg  tablets to 2 pills three times daily. When the tests come back, we will contact you regarding the next steps. Follow up in 4 weeks.

## 2013-10-05 NOTE — Progress Notes (Signed)
NEUROLOGY CONSULTATION NOTE  [name] MRN: [id number] DOB: 13-Oct-1940  Referring provider: Dr. Linda Hedges Primary care provider: Stacy Gardner, PA-C (Dr. Linda Hedges retired)  Reason for consult:  Back pain  HISTORY OF PRESENT ILLNESS: [name] is a 73 year old right-handed woman with AV block, pacemaker, hypothyroidism and hypertension who presents for back pain.  Records and images were personally reviewed where available.    She first noticed symptoms about 5 weeks ago.  She reached to grab something when she suddenly felt a deep excruciating stabbing and burning pain radiating from the mid thoracic region on both sides of the back down the lower back.  She went to the ED on 09/01/13.  XR of the thoracic spine revealed mild degenerative changes within the upper thoracic spine but no acute abnormalities.  XR of the lumbar spine revealed chronic degenerative changes but also without acute abnormality.  The pain persisted and was so severe that she couldn't walk.  The pain seemed to be aggravated with prolonged standing.  However, she noted discomfort in all positions.  Since the onset, she has only been able to sleep in a recliner instead of her bed.  It is not worse any particular time of day nor is it worse with activity or inactivity.  In addition, she later developed an aching pain on both sides of her waist and hips.  There is no numbness or weakness involving the legs.  There is no radicular pain down the legs.  She denies bowel or bladder dysfunction.  She denies symptoms involving the neck and arms.  She was started on meloxicam, which didn't seem to help at first.  She was then started on gabapentin 18m three times daily.  Over the past 2.5 weeks, she has noted relief although not resolved.  She is able to walk again.  She reports prior history of back pain but nothing like this.  She denies fevers, joint pain or rash.  She was evaluated by her PCP who noted some upper epigastric  tenderness to palpation and there was concern for possible pancreatitis.  CT of abdomen wo contrast was performed on 09/03/13 which did not reveal any abnormalities of the pancreas.  Enlargement of the spleen with splenic lesions noted, which were also present on a prior study in 2005.  Amylase, lipase and liver enzymes were .  PAST MEDICAL HISTORY: Past Medical History  Diagnosis Date  . Atrioventricular block, complete     a. 08/2011 Upgrage of PPM to MDT Adapta L Dual Chamber PPM ser # [id number].  . Diastolic dysfunction   . HTN (hypertension)   . Acute neck pain   . Tension headache   . Multinodular goiter   . Contact dermatitis and eczema     unspec cause  . Tooth abscess   . Thyroid nodule   . Osteoporosis   . GERD (gastroesophageal reflux disease)   . Depression   . Lung nodule   . Superficial thrombophlebitis   . Hypothyroidism   . Acute sinusitis   . Mitral regurgitation     a.03/2012 Echo: EF 65-70%, mod MR  . Chest pain     a. 09/2007 Myoview: EF 77%, no ischemia/infarct.  .Marland KitchenPONV (postoperative nausea and vomiting)   . Pacemaker     PAST SURGICAL HISTORY: Past Surgical History  Procedure Laterality Date  . Laporatomy for ruptured diverticulum with colostomy  2003  . Take down of colostomy  2004  . Exploratory laparotomy    .  Medtronic pacemaker  05/2003  . Hernia repair    . Colon surgery    . Thyroidectomy N/A 08/07/2012    Procedure: THYROIDECTOMY;  Surgeon: Earnstine Regal, MD;  Location: [facility];  Service: General;  Laterality: N/A;  . Colonoscopy  01/19/2013    colonoscopy    MEDICATIONS: Current Outpatient Prescriptions on File Prior to Visit  Medication Sig Dispense Refill  . calcium carbonate (OS-CAL - DOSED IN MG OF ELEMENTAL CALCIUM) 1250 MG tablet Take 500 mg by mouth 3 (three) times daily.      . Chlorpheniramine-APAP (CORICIDIN) 2-325 MG TABS Take 2 tablets by mouth daily as needed. For allergies      . denosumab (PROLIA) 60 MG/ML SOLN Inject 60 mg  into the skin every 6 (six) months. One injection every 6 months Last dose was in October, Next dose due in April      . diphenhydramine-acetaminophen (TYLENOL PM) 25-500 MG TABS Take 1 tablet by mouth at bedtime as needed.      [name] levothyroxine (SYNTHROID, LEVOTHROID) 88 MCG tablet Take 1 tablet (88 mcg total) by mouth daily before breakfast.  90 tablet  3  . lisinopril-hydrochlorothiazide (PRINZIDE,ZESTORETIC) 20-12.5 MG per tablet Take 1 tablet by mouth daily.  90 tablet  2  . Multiple Vitamin (MULTIVITAMIN) capsule Take 1 capsule by mouth daily.       [name] omeprazole (PRILOSEC) 20 MG capsule Take 20 mg by mouth daily as needed. Heart burn      . OVER THE COUNTER MEDICATION Take 1 tablet by mouth daily. Airborne tablets- when she goes out in public      . [DISCONTINUED] famotidine (PEPCID) 20 MG tablet Take 20 mg by mouth as needed.        No current facility-administered medications on file prior to visit.    ALLERGIES: Allergies  Allergen Reactions  . Tape Rash  . Codeine     REACTION: N/V    FAMILY HISTORY: Family History  Problem Relation Age of Onset  . Heart disease Mother   . Heart failure Father   . Diverticulosis Sister   . Hypertension Brother   . Hypertension Brother   . Cancer Son     breast    SOCIAL HISTORY: History   Social History  . Marital Status: Single    Spouse Name: N/A    Number of Children: 2  . Years of Education: 16   Occupational History  . Retired Production assistant, radio for the [employer]   .     Social History Main Topics  . Smoking status: Former Smoker    Quit date: 10/06/1963  . Smokeless tobacco: Never Used     Comment: quit 30 years ago  . Alcohol Use: No  . Drug Use: No  . Sexual Activity: No   Other Topics Concern  . Not on file   Social History Narrative   The patient is a Writer of [employer].  She worked for the [employer] in [employer].  She was married for 22 years, divorced, and has  remained single. She has 1 grown son, 1 grown daughter.  She has 3 grandchildren, 2 of her children   live in [place].  She is retired.  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    REVIEW OF SYSTEMS: Constitutional: No fevers, chills, or sweats, no generalized fatigue, change  in appetite Eyes: No visual changes, double vision, eye pain Ear, nose and throat: No hearing loss, ear pain, nasal congestion, sore throat Cardiovascular: No chest pain, palpitations Respiratory:  No shortness of breath at rest or with exertion, wheezes GastrointestinaI: No nausea, vomiting, diarrhea, abdominal pain, fecal incontinence Genitourinary:  No dysuria, urinary retention or frequency Musculoskeletal:  back pain Integumentary: No rash, pruritus, skin lesions Neurological: as above Psychiatric: No depression, insomnia, anxiety Endocrine: No palpitations, fatigue, diaphoresis, mood swings, change in appetite, change in weight, increased thirst Hematologic/Lymphatic:  No anemia, purpura, petechiae. Allergic/Immunologic: no itchy/runny eyes, nasal congestion, recent allergic reactions, rashes  PHYSICAL EXAM: Filed Vitals:   10/05/13 1231  BP: 158/92  Pulse: 80  Resp: 14   General: No acute distress Head:  Normocephalic/atraumatic Neck: supple, no paraspinal tenderness, full range of motion Back: No paraspinal tenderness.  No tenderness at the hips. Heart: regular rate and rhythm Lungs: Clear to auscultation bilaterally. Vascular: No carotid bruits. Neurological Exam: Mental status: alert and oriented to person, place, and time, recent and remote memory intact, fund of knowledge intact, attention and concentration intact, speech fluent and not dysarthric, language intact. Cranial nerves: CN I: not tested CN II: pupils equal, round and reactive to light, visual fields intact, fundi unremarkable, without vessel changes,  exudates, hemorrhages or papilledema. CN III, IV, VI:  full range of motion, no nystagmus, no ptosis CN V: facial sensation intact CN VII: upper and lower face symmetric CN VIII: hearing intact CN IX, X: gag intact, uvula midline CN XI: sternocleidomastoid and trapezius muscles intact CN XII: tongue midline Bulk & Tone: normal, no fasciculations. Motor: 5/5 throughout Sensation: pinprick and vibration intact. Deep Tendon Reflexes: 2+ throughout except absent in ankles, toes down Finger to nose testing: no dysmetria Heel to shin: no dysmetria Gait: normal station and stride.  Able to turn, walk on toes, heels and in tandem. Romberg negative. Straight leg raise negative.  FABER test negative.  IMPRESSION: Back pain and hip pain.  Does not seem radicular in nature.  Se exhibits no radicular pain down the legs.  Thoracic pain is diffuse and does not involve one particular dermatome.  PLAN: I would first check ESR and ANA to look for evidence of a possible systemic inflammatory process Further testing pending these results. In the meantime, we will increase the gabapentin to 282m three times daily. Follow up in 4 weeks.  Thank you for allowing me to take part in the care of this patient.  AMetta Clines DO  CC:  NStacy Gardner PA-C

## 2013-10-06 LAB — ANA: ANA: NEGATIVE

## 2013-10-08 ENCOUNTER — Encounter: Payer: Self-pay | Admitting: *Deleted

## 2013-10-11 ENCOUNTER — Telehealth: Payer: Self-pay | Admitting: Neurology

## 2013-10-11 ENCOUNTER — Telehealth: Payer: Self-pay | Admitting: *Deleted

## 2013-10-11 NOTE — Telephone Encounter (Signed)
Calling for test results. Please call / Sherri S.

## 2013-10-11 NOTE — Telephone Encounter (Signed)
Patient is aware that labs are normal and she can increase Gabapentin to 300 mg TID

## 2013-10-11 NOTE — Telephone Encounter (Signed)
Tests are normal.  I would try to to treat symptomatically for now.  If needed, gabapentin can be increased to 300mg  three times daily.

## 2013-10-11 NOTE — Telephone Encounter (Signed)
Have you seen patients lab results if so  Please advise

## 2013-10-14 ENCOUNTER — Encounter (HOSPITAL_COMMUNITY): Payer: Self-pay | Admitting: Emergency Medicine

## 2013-10-14 ENCOUNTER — Observation Stay (HOSPITAL_COMMUNITY)
Admission: EM | Admit: 2013-10-14 | Discharge: 2013-10-19 | Disposition: A | Discharge status: Home / Self Care | Payer: Medicare Other | Attending: Internal Medicine | Admitting: Internal Medicine

## 2013-10-14 ENCOUNTER — Emergency Department (HOSPITAL_COMMUNITY): Payer: Medicare Other

## 2013-10-14 DIAGNOSIS — K219 Gastro-esophageal reflux disease without esophagitis: Secondary | ICD-10-CM

## 2013-10-14 DIAGNOSIS — R109 Unspecified abdominal pain: Secondary | ICD-10-CM | POA: Diagnosis present

## 2013-10-14 DIAGNOSIS — M549 Dorsalgia, unspecified: Secondary | ICD-10-CM

## 2013-10-14 DIAGNOSIS — M6740 Ganglion, unspecified site: Secondary | ICD-10-CM

## 2013-10-14 DIAGNOSIS — I509 Heart failure, unspecified: Secondary | ICD-10-CM | POA: Insufficient documentation

## 2013-10-14 DIAGNOSIS — M8448XA Pathological fracture, other site, initial encounter for fracture: Secondary | ICD-10-CM | POA: Insufficient documentation

## 2013-10-14 DIAGNOSIS — I442 Atrioventricular block, complete: Secondary | ICD-10-CM

## 2013-10-14 DIAGNOSIS — M545 Low back pain, unspecified: Secondary | ICD-10-CM | POA: Diagnosis present

## 2013-10-14 DIAGNOSIS — E042 Nontoxic multinodular goiter: Secondary | ICD-10-CM

## 2013-10-14 DIAGNOSIS — Z87891 Personal history of nicotine dependence: Secondary | ICD-10-CM | POA: Insufficient documentation

## 2013-10-14 DIAGNOSIS — Z862 Personal history of diseases of the blood and blood-forming organs and certain disorders involving the immune mechanism: Secondary | ICD-10-CM

## 2013-10-14 DIAGNOSIS — Z950 Presence of cardiac pacemaker: Secondary | ICD-10-CM | POA: Diagnosis present

## 2013-10-14 DIAGNOSIS — K279 Peptic ulcer, site unspecified, unspecified as acute or chronic, without hemorrhage or perforation: Secondary | ICD-10-CM | POA: Diagnosis present

## 2013-10-14 DIAGNOSIS — J984 Other disorders of lung: Secondary | ICD-10-CM

## 2013-10-14 DIAGNOSIS — Z8639 Personal history of other endocrine, nutritional and metabolic disease: Secondary | ICD-10-CM

## 2013-10-14 DIAGNOSIS — K5900 Constipation, unspecified: Secondary | ICD-10-CM | POA: Diagnosis present

## 2013-10-14 DIAGNOSIS — M4850XA Collapsed vertebra, not elsewhere classified, site unspecified, initial encounter for fracture: Secondary | ICD-10-CM | POA: Diagnosis present

## 2013-10-14 DIAGNOSIS — I059 Rheumatic mitral valve disease, unspecified: Secondary | ICD-10-CM | POA: Insufficient documentation

## 2013-10-14 DIAGNOSIS — G44209 Tension-type headache, unspecified, not intractable: Secondary | ICD-10-CM

## 2013-10-14 DIAGNOSIS — I8000 Phlebitis and thrombophlebitis of superficial vessels of unspecified lower extremity: Secondary | ICD-10-CM

## 2013-10-14 DIAGNOSIS — M810 Age-related osteoporosis without current pathological fracture: Secondary | ICD-10-CM

## 2013-10-14 DIAGNOSIS — F329 Major depressive disorder, single episode, unspecified: Secondary | ICD-10-CM

## 2013-10-14 DIAGNOSIS — D440 Neoplasm of uncertain behavior of thyroid gland: Secondary | ICD-10-CM

## 2013-10-14 DIAGNOSIS — G8929 Other chronic pain: Secondary | ICD-10-CM | POA: Insufficient documentation

## 2013-10-14 DIAGNOSIS — I5032 Chronic diastolic (congestive) heart failure: Secondary | ICD-10-CM | POA: Insufficient documentation

## 2013-10-14 DIAGNOSIS — I519 Heart disease, unspecified: Secondary | ICD-10-CM | POA: Diagnosis present

## 2013-10-14 DIAGNOSIS — I10 Essential (primary) hypertension: Secondary | ICD-10-CM | POA: Diagnosis present

## 2013-10-14 DIAGNOSIS — E890 Postprocedural hypothyroidism: Secondary | ICD-10-CM | POA: Diagnosis present

## 2013-10-14 DIAGNOSIS — F3289 Other specified depressive episodes: Secondary | ICD-10-CM

## 2013-10-14 DIAGNOSIS — K5641 Fecal impaction: Principal | ICD-10-CM | POA: Diagnosis present

## 2013-10-14 DIAGNOSIS — D179 Benign lipomatous neoplasm, unspecified: Secondary | ICD-10-CM

## 2013-10-14 LAB — COMPREHENSIVE METABOLIC PANEL
ALT: 18 U/L (ref 0–35)
AST: 26 U/L (ref 0–37)
Albumin: 3.9 g/dL (ref 3.5–5.2)
Alkaline Phosphatase: 123 U/L — ABNORMAL HIGH (ref 39–117)
BUN: 19 mg/dL (ref 6–23)
CO2: 22 mEq/L (ref 19–32)
Calcium: 10.1 mg/dL (ref 8.4–10.5)
Chloride: 90 mEq/L — ABNORMAL LOW (ref 96–112)
Creatinine, Ser: 0.72 mg/dL (ref 0.50–1.10)
GFR calc Af Amer: >90 mL/min (ref >90)
GFR calc non Af Amer: 82 mL/min — ABNORMAL LOW (ref >90)
Glucose, Bld: 98 mg/dL (ref 70–99)
Potassium: 4.2 mEq/L (ref 3.7–5.3)
Sodium: 131 mEq/L — ABNORMAL LOW (ref 137–147)
Total Bilirubin: 0.7 mg/dL (ref 0.3–1.2)
Total Protein: 7.3 g/dL (ref 6.0–8.3)

## 2013-10-14 LAB — CBC WITH DIFFERENTIAL/PLATELET
Basophils Absolute: 0 10*3/uL (ref 0.0–0.1)
Basophils Relative: 0 % (ref 0–1)
Eosinophils Absolute: 0.1 10*3/uL (ref 0.0–0.7)
Eosinophils Relative: 1 % (ref 0–5)
HCT: 38 % (ref 36.0–46.0)
Hemoglobin: 12.6 g/dL (ref 12.0–15.0)
Lymphocytes Relative: 18 % (ref 12–46)
Lymphs Abs: 1.3 10*3/uL (ref 0.7–4.0)
MCH: 27.4 pg (ref 26.0–34.0)
MCHC: 33.2 g/dL (ref 30.0–36.0)
MCV: 82.6 fL (ref 78.0–100.0)
Monocytes Absolute: 0.8 10*3/uL (ref 0.1–1.0)
Monocytes Relative: 11 % (ref 3–12)
Neutro Abs: 4.9 10*3/uL (ref 1.7–7.7)
Neutrophils Relative %: 70 % (ref 43–77)
Platelets: 173 10*3/uL (ref 150–400)
RBC: 4.6 MIL/uL (ref 3.87–5.11)
RDW: 13.3 % (ref 11.5–15.5)
WBC: 7 10*3/uL (ref 4.0–10.5)

## 2013-10-14 LAB — URINALYSIS, ROUTINE W REFLEX MICROSCOPIC
Bilirubin Urine: NEGATIVE
Glucose, UA: NEGATIVE mg/dL
Hgb urine dipstick: NEGATIVE
Ketones, ur: NEGATIVE mg/dL
Leukocytes, UA: NEGATIVE
Nitrite: NEGATIVE
Protein, ur: NEGATIVE mg/dL
Specific Gravity, Urine: 1.024 (ref 1.005–1.030)
Urobilinogen, UA: 0.2 mg/dL (ref 0.0–1.0)
pH: 6.5 (ref 5.0–8.0)

## 2013-10-14 MED ORDER — MORPHINE SULFATE 4 MG/ML IJ SOLN
4.0000 mg | Freq: Once | INTRAMUSCULAR | Status: AC
  Administered 2013-10-14: 4 mg via INTRAVENOUS
  Filled 2013-10-14: qty 1

## 2013-10-14 MED ORDER — MILK AND MOLASSES ENEMA
1.0000 | Freq: Once | RECTAL | Status: AC
  Administered 2013-10-15: 250 mL via RECTAL
  Filled 2013-10-14: qty 250

## 2013-10-14 MED ORDER — KETOROLAC TROMETHAMINE 30 MG/ML IJ SOLN
30.0000 mg | Freq: Once | INTRAMUSCULAR | Status: AC
  Administered 2013-10-14: 30 mg via INTRAVENOUS
  Filled 2013-10-14: qty 1

## 2013-10-14 MED ORDER — ONDANSETRON HCL 4 MG/2ML IJ SOLN
4.0000 mg | Freq: Once | INTRAMUSCULAR | Status: AC
  Administered 2013-10-14: 4 mg via INTRAVENOUS
  Filled 2013-10-14: qty 2

## 2013-10-14 MED ORDER — MAGNESIUM CITRATE PO SOLN
1.0000 | Freq: Once | ORAL | Status: AC
  Administered 2013-10-15: 1 via ORAL
  Filled 2013-10-14: qty 296

## 2013-10-14 MED ORDER — IOHEXOL 300 MG/ML  SOLN
100.0000 mL | Freq: Once | INTRAMUSCULAR | Status: AC | PRN
  Administered 2013-10-14: 100 mL via INTRAVENOUS

## 2013-10-14 MED ORDER — DIAZEPAM 5 MG/ML IJ SOLN
5.0000 mg | Freq: Once | INTRAMUSCULAR | Status: AC
  Administered 2013-10-14: 5 mg via INTRAVENOUS
  Filled 2013-10-14: qty 2

## 2013-10-14 MED ORDER — IOHEXOL 300 MG/ML  SOLN
25.0000 mL | Freq: Once | INTRAMUSCULAR | Status: AC | PRN
  Administered 2013-10-14: 25 mL via ORAL

## 2013-10-14 MED ORDER — SODIUM CHLORIDE 0.9 % IV BOLUS (SEPSIS)
1000.0000 mL | Freq: Once | INTRAVENOUS | Status: AC
Start: 2013-10-14 — End: 2013-10-14
  Administered 2013-10-14: 1000 mL via INTRAVENOUS

## 2013-10-14 MED ORDER — HYDROMORPHONE HCL PF 1 MG/ML IJ SOLN
1.0000 mg | Freq: Once | INTRAMUSCULAR | Status: AC
  Administered 2013-10-14: 1 mg via INTRAVENOUS
  Filled 2013-10-14: qty 1

## 2013-10-14 NOTE — ED Notes (Signed)
Soap suds enema administered. Pt had difficulty holding fluid in. Is unable to have BM at this time.

## 2013-10-14 NOTE — ED Notes (Signed)
Pt sts still having back pain and abd pain but pt was eating crackers and tolerating; instructed pt not to eat any more crackers

## 2013-10-14 NOTE — ED Notes (Signed)
Pt has returned from radiology.  

## 2013-10-14 NOTE — ED Notes (Signed)
Pt transported to CT ?

## 2013-10-14 NOTE — ED Provider Notes (Signed)
CSN: [id number]     Arrival date & time 10/14/13  1559 History   First MD Initiated Contact with Patient 10/14/13 1952     Chief Complaint  Patient presents with  . Abdominal Pain     (Consider location/radiation/quality/duration/timing/severity/associated sxs/prior Treatment) HPI  73yF with worsening abdominal pain and distension. Says she doesn't think she has had a bowel movement in about a month. Has been increasing dietary bulk w/o and result. Pain is diffuse, but worse in lower abdomen. Does not lateralized. No appreciable exacerbating or relieving factors. No n/v. Some urinary urgency. No dysuria or hematuria. No fever or chills. Additionally pain in lower back. No trauma. No numbness, tingling or loss of strength. Surgical hx significant for hernia repair and partial colectomy 2/2 perforated diverticulitis several years ago and colostomy reversed 3 months later. Reports colonoscopy by Dr Cristina Gong in 2014 which she thinks was unremarkable. Has not had issues with significant constipation previously. No chronic opiate use.   Past Medical History  Diagnosis Date  . Atrioventricular block, complete     a. 08/2011 Upgrage of PPM to MDT Adapta L Dual Chamber PPM ser # [id number].  . Diastolic dysfunction   . HTN (hypertension)   . Acute neck pain   . Tension headache   . Multinodular goiter   . Contact dermatitis and eczema     unspec cause  . Tooth abscess   . Thyroid nodule   . Osteoporosis   . GERD (gastroesophageal reflux disease)   . Depression   . Lung nodule   . Superficial thrombophlebitis   . Hypothyroidism   . Acute sinusitis   . Mitral regurgitation     a.03/2012 Echo: EF 65-70%, mod MR  . Chest pain     a. 09/2007 Myoview: EF 77%, no ischemia/infarct.  [name] PONV (postoperative nausea and vomiting)   . Pacemaker   . CHF (congestive heart failure)    Past Surgical History  Procedure Laterality Date  . Laporatomy for ruptured diverticulum with colostomy  2003  . Take  down of colostomy  2004  . Exploratory laparotomy    . Medtronic pacemaker  05/2003  . Hernia repair    . Colon surgery    . Thyroidectomy N/A 08/07/2012    Procedure: THYROIDECTOMY;  Surgeon: Earnstine Regal, MD;  Location: [facility];  Service: General;  Laterality: N/A;  . Colonoscopy  01/19/2013    colonoscopy   Family History  Problem Relation Age of Onset  . Heart disease Mother   . Heart failure Father   . Diverticulosis Sister   . Hypertension Brother   . Hypertension Brother   . Cancer Son     breast   History  Substance Use Topics  . Smoking status: Former Smoker    Quit date: 10/06/1963  . Smokeless tobacco: Never Used     Comment: quit 30 years ago  . Alcohol Use: No   OB History   Grav Para Term Preterm Abortions TAB SAB Ect Mult Living                 Review of Systems  All systems reviewed and negative, other than as noted in HPI.   Allergies  Tape and Codeine  Home Medications   Prior to Admission medications   Medication Sig Start Date End Date Taking? Authorizing Provider  calcium carbonate (OS-CAL - DOSED IN MG OF ELEMENTAL CALCIUM) 1250 MG tablet Take 500 mg by mouth 3 (three) times daily. 08/08/12  Yes  Earnstine Regal, MD  Chlorpheniramine-APAP (CORICIDIN) 2-325 MG TABS Take 2 tablets by mouth daily as needed. For allergies   Yes Historical Provider, MD  denosumab (PROLIA) 60 MG/ML SOLN Inject 60 mg into the skin every 6 (six) months. One injection every 6 months Last dose was in October, Next dose due in April   Yes Historical Provider, MD  diphenhydramine-acetaminophen (TYLENOL PM) 25-500 MG TABS Take 1 tablet by mouth at bedtime as needed (sleep).    Yes Historical Provider, MD  gabapentin (NEURONTIN) 300 MG capsule Take 300 mg by mouth 3 (three) times daily.   Yes Historical Provider, MD  levothyroxine (SYNTHROID, LEVOTHROID) 88 MCG tablet Take 1 tablet (88 mcg total) by mouth daily before breakfast. 07/14/13  Yes Neena Rhymes, MD   lisinopril-hydrochlorothiazide (PRINZIDE,ZESTORETIC) 20-12.5 MG per tablet Take 1 tablet by mouth daily. 03/05/13  Yes Deboraha Sprang, MD  meloxicam (MOBIC) 15 MG tablet Take 15 mg by mouth daily.   Yes Historical Provider, MD  Multiple Vitamin (MULTIVITAMIN) capsule Take 1 capsule by mouth daily.    Yes Historical Provider, MD  omeprazole (PRILOSEC) 20 MG capsule Take 20 mg by mouth daily as needed. Heart burn   Yes Historical Provider, MD   BP 108/47  Pulse 71  Temp(Src) 99.4 F (37.4 C) (Oral)  Resp 18  Ht 5\' 5"  (1.651 m)  Wt 142 lb (64.411 kg)  BMI 23.63 kg/m2  SpO2 99% Physical Exam  Nursing note and vitals reviewed. Constitutional: She appears well-developed and well-nourished. No distress.  HENT:  Head: Normocephalic and atraumatic.  Eyes: Conjunctivae are normal. Right eye exhibits no discharge. Left eye exhibits no discharge.  Neck: Neck supple.  Cardiovascular: Normal rate, regular rhythm and normal heart sounds.  Exam reveals no gallop and no friction rub.   No murmur heard. Pulmonary/Chest: Effort normal and breath sounds normal. No respiratory distress.  Abdominal: Soft. She exhibits distension. There is tenderness.  Mild diffuse tenderness. No rebound or guarding.   Musculoskeletal: She exhibits no edema and no tenderness.  No cva tenderness  Neurological: She is alert.  Skin: Skin is warm and dry.  Psychiatric: She has a normal mood and affect. Her behavior is normal. Thought content normal.    ED Course  Procedures (including critical care time) Labs Review Labs Reviewed  COMPREHENSIVE METABOLIC PANEL - Abnormal; Notable for the following:    Sodium 131 (*)    Chloride 90 (*)    Alkaline Phosphatase 123 (*)    GFR calc non Af Amer 82 (*)    All other components within normal limits  CBC WITH DIFFERENTIAL  URINALYSIS, ROUTINE W REFLEX MICROSCOPIC    Imaging Review Ct Abdomen Pelvis W Contrast  10/14/2013   CLINICAL DATA:  Back and abdominal pain with  decreased stool frequency  EXAM: CT ABDOMEN AND PELVIS WITH CONTRAST  TECHNIQUE: Multidetector CT imaging of the abdomen and pelvis was performed using the standard protocol following bolus administration of intravenous contrast.  CONTRAST:  166mL OMNIPAQUE IOHEXOL 300 MG/ML SOLN intravenously ; the patient also received oral contrast material  COMPARISON:  CT ABDOMEN W/O CM dated 09/03/2013  FINDINGS: The stomach contains most of the orally administered contrast. Only a small amount has entered the duodenum. There is subjective thickening of the wall of the pylorus and proximal duodenum. The jejunum and ileum contain no contrast but exhibit no acute abnormalities.  The cecum and ascending colon and proximal transverse colon are quite distended with inspissated stool. There  is no evidence of abnormal bowel wall thickening. More distally smaller amounts of stool and gas are present in the descending colon and rectosigmoid. There is no fecal impaction within the rectum.  The liver, pancreas, adrenal glands, and kidneys exhibit no acute abnormalities. There is a tiny calcified stone in the dependent portion of the gallbladder. There are hypodensities within the spleen which are chronic and likely reflect hemangiomas. These become less conspicuous on the delayed images. The caliber of the abdominal aorta is normal. There is no periaortic or pericaval lymphadenopathy. Within the pelvis the partially distended urinary bladder is grossly normal. The uterus is small and exhibits calcifications consistent with fibroids. No adnexal masses are demonstrated. There is no free pelvic fluid. There is no umbilical hernia. The patient has undergone previous ventral hernia repair. Inferior to the repair there is a focal shallow bulge in the peritoneum in the anterior pelvic wall.  The lumbar spine exhibits degenerative disc change at multiple levels. There is mild endplate depression of L2 with a prominent posterior osteophyte. The  bony pelvis exhibits no acute abnormality.  The lung bases exhibit minimal atelectasis in the extreme posterior costophrenic gutters.  IMPRESSION: 1. There is a large amount of stool in the cecum and ascending colon and mid transverse colon. More distally there is less fecal material and more gas. This pattern consistent with severe constipation. There is no evidence of enteritis or colitis or diverticulitis. 2. The small bowel is not abnormally distended. There is subjective thickening of the wall of the pylorus and first portion of the duodenum which may reflect peptic ulcer disease. There is no evidence of ulceration or mass. 3. Is a tiny calcified gallstone. There is no CT evidence of acute cholecystitis. 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   Electronically Signed   By: David  Martinique   On: 10/14/2013 21:51     EKG Interpretation None      MDM   Final diagnoses:  Abdominal pain  Constipation    73yF with diffuse crampy abdominal pain. CT with very large stool burden. No rectal impaction. Most likely cause of symptoms. Pretty unremarkable otherwise. Pt pretty uncomfortable. Enemas tried w/o result. Majority of stool in R/transverse colon though. Will try golytely, MoM.     Virgel Manifold, MD 10/15/13 [phone]

## 2013-10-14 NOTE — ED Notes (Signed)
Pt c/o abd and back pain "For a while." states she hasnt had a bowel movement in a month. shes been trying to eat fruits and vegetables but still can not have a bowel movement.

## 2013-10-14 NOTE — ED Notes (Signed)
Patient transported to CT 

## 2013-10-15 ENCOUNTER — Inpatient Hospital Stay (HOSPITAL_COMMUNITY): Payer: Medicare Other

## 2013-10-15 ENCOUNTER — Encounter (HOSPITAL_COMMUNITY): Payer: Self-pay | Admitting: Internal Medicine

## 2013-10-15 DIAGNOSIS — R109 Unspecified abdominal pain: Secondary | ICD-10-CM | POA: Diagnosis present

## 2013-10-15 DIAGNOSIS — M545 Low back pain, unspecified: Secondary | ICD-10-CM | POA: Diagnosis present

## 2013-10-15 DIAGNOSIS — I442 Atrioventricular block, complete: Secondary | ICD-10-CM

## 2013-10-15 DIAGNOSIS — K5900 Constipation, unspecified: Secondary | ICD-10-CM | POA: Diagnosis present

## 2013-10-15 DIAGNOSIS — K279 Peptic ulcer, site unspecified, unspecified as acute or chronic, without hemorrhage or perforation: Secondary | ICD-10-CM | POA: Diagnosis present

## 2013-10-15 DIAGNOSIS — E890 Postprocedural hypothyroidism: Secondary | ICD-10-CM

## 2013-10-15 LAB — CBC WITH DIFFERENTIAL/PLATELET
Basophils Absolute: 0 10*3/uL (ref 0.0–0.1)
Basophils Relative: 0 % (ref 0–1)
EOS PCT: 0 % (ref 0–5)
Eosinophils Absolute: 0 10*3/uL (ref 0.0–0.7)
HCT: 37.2 % (ref 36.0–46.0)
Hemoglobin: 12.3 g/dL (ref 12.0–15.0)
LYMPHS ABS: 0.6 10*3/uL — AB (ref 0.7–4.0)
LYMPHS PCT: 10 % — AB (ref 12–46)
MCH: 27.3 pg (ref 26.0–34.0)
MCHC: 33.1 g/dL (ref 30.0–36.0)
MCV: 82.7 fL (ref 78.0–100.0)
MONO ABS: 0.4 10*3/uL (ref 0.1–1.0)
MONOS PCT: 8 % (ref 3–12)
Neutro Abs: 4.9 10*3/uL (ref 1.7–7.7)
Neutrophils Relative %: 82 % — ABNORMAL HIGH (ref 43–77)
Platelets: 160 10*3/uL (ref 150–400)
RBC: 4.5 MIL/uL (ref 3.87–5.11)
RDW: 13.1 % (ref 11.5–15.5)
WBC: 5.9 10*3/uL (ref 4.0–10.5)

## 2013-10-15 LAB — COMPREHENSIVE METABOLIC PANEL
ALK PHOS: 127 U/L — AB (ref 39–117)
ALT: 22 U/L (ref 0–35)
AST: 30 U/L (ref 0–37)
Albumin: 3.7 g/dL (ref 3.5–5.2)
BUN: 20 mg/dL (ref 6–23)
CO2: 24 mEq/L (ref 19–32)
Calcium: 10.3 mg/dL (ref 8.4–10.5)
Chloride: 95 mEq/L — ABNORMAL LOW (ref 96–112)
Creatinine, Ser: 0.6 mg/dL (ref 0.50–1.10)
GFR calc Af Amer: >90 mL/min (ref >90)
GFR calc non Af Amer: 87 mL/min — ABNORMAL LOW (ref >90)
Glucose, Bld: 101 mg/dL — ABNORMAL HIGH (ref 70–99)
POTASSIUM: 4.5 meq/L (ref 3.7–5.3)
Sodium: 136 mEq/L — ABNORMAL LOW (ref 137–147)
TOTAL PROTEIN: 6.9 g/dL (ref 6.0–8.3)
Total Bilirubin: 0.6 mg/dL (ref 0.3–1.2)

## 2013-10-15 LAB — TSH: TSH: 3.58 u[IU]/mL (ref 0.350–4.500)

## 2013-10-15 LAB — MRSA PCR SCREENING: MRSA by PCR: NEGATIVE

## 2013-10-15 MED ORDER — PANTOPRAZOLE SODIUM 40 MG IV SOLR
40.0000 mg | INTRAVENOUS | Status: DC
  Filled 2013-10-15 (×2): qty 40

## 2013-10-15 MED ORDER — ONDANSETRON HCL 4 MG/2ML IJ SOLN: 4.0000 mg | Freq: Four times a day (QID) | INTRAMUSCULAR | Status: DC | PRN

## 2013-10-15 MED ORDER — METOCLOPRAMIDE HCL 5 MG/ML IJ SOLN
5.0000 mg | Freq: Once | INTRAMUSCULAR | Status: AC
  Administered 2013-10-15: 5 mg via INTRAVENOUS
  Filled 2013-10-15: qty 2

## 2013-10-15 MED ORDER — PANTOPRAZOLE SODIUM 40 MG PO TBEC
40.0000 mg | DELAYED_RELEASE_TABLET | Freq: Every day | ORAL | Status: DC
  Administered 2013-10-15 – 2013-10-19 (×5): 40 mg via ORAL
  Filled 2013-10-15 (×6): qty 1

## 2013-10-15 MED ORDER — DIPHENHYDRAMINE-APAP (SLEEP) 25-500 MG PO TABS: 1.0000 | ORAL_TABLET | Freq: Every evening | ORAL | Status: DC | PRN

## 2013-10-15 MED ORDER — ACETAMINOPHEN 500 MG PO TABS
1000.0000 mg | ORAL_TABLET | Freq: Three times a day (TID) | ORAL | Status: DC
  Administered 2013-10-15 – 2013-10-17 (×8): 1000 mg via ORAL
  Filled 2013-10-15 (×11): qty 2

## 2013-10-15 MED ORDER — POLYETHYLENE GLYCOL 3350 17 G PO PACK: 17.0000 g | PACK | Freq: Every day | ORAL | Status: DC

## 2013-10-15 MED ORDER — DOCUSATE SODIUM 100 MG PO CAPS: 100.0000 mg | ORAL_CAPSULE | Freq: Two times a day (BID) | ORAL | Status: DC | PRN

## 2013-10-15 MED ORDER — SENNA 8.6 MG PO TABS
2.0000 | ORAL_TABLET | Freq: Two times a day (BID) | ORAL | Status: DC
  Administered 2013-10-15 – 2013-10-19 (×8): 17.2 mg via ORAL
  Filled 2013-10-15 (×10): qty 2

## 2013-10-15 MED ORDER — POLYETHYLENE GLYCOL 3350 17 G PO PACK
17.0000 g | PACK | Freq: Two times a day (BID) | ORAL | Status: DC
  Administered 2013-10-15 – 2013-10-19 (×7): 17 g via ORAL
  Filled 2013-10-15 (×10): qty 1

## 2013-10-15 MED ORDER — ACETAMINOPHEN 650 MG RE SUPP: 650.0000 mg | Freq: Four times a day (QID) | RECTAL | Status: DC | PRN

## 2013-10-15 MED ORDER — POLYETHYLENE GLYCOL 3350 17 G PO PACK: 17.0000 g | PACK | Freq: Two times a day (BID) | ORAL | Status: DC | PRN

## 2013-10-15 MED ORDER — SODIUM CHLORIDE 0.9 % IV SOLN
INTRAVENOUS | Status: AC
  Administered 2013-10-15: 50 mL/h via INTRAVENOUS

## 2013-10-15 MED ORDER — ACETAMINOPHEN 325 MG PO TABS: 650.0000 mg | ORAL_TABLET | Freq: Four times a day (QID) | ORAL | Status: DC | PRN

## 2013-10-15 MED ORDER — PEG 3350-KCL-NA BICARB-NACL 420 G PO SOLR
4000.0000 mL | Freq: Once | ORAL | Status: AC
  Administered 2013-10-15: 4000 mL via ORAL
  Filled 2013-10-15: qty 4000

## 2013-10-15 MED ORDER — SODIUM CHLORIDE 0.9 % IV SOLN: INTRAVENOUS | Status: DC

## 2013-10-15 MED ORDER — GABAPENTIN 300 MG PO CAPS
300.0000 mg | ORAL_CAPSULE | Freq: Three times a day (TID) | ORAL | Status: DC
  Administered 2013-10-15 – 2013-10-19 (×14): 300 mg via ORAL
  Filled 2013-10-15 (×15): qty 1

## 2013-10-15 MED ORDER — ZOLPIDEM TARTRATE 5 MG PO TABS: 5.0000 mg | ORAL_TABLET | Freq: Every evening | ORAL | Status: DC | PRN

## 2013-10-15 MED ORDER — ENOXAPARIN SODIUM 40 MG/0.4ML ~~LOC~~ SOLN
40.0000 mg | SUBCUTANEOUS | Status: DC
  Administered 2013-10-15 – 2013-10-17 (×3): 40 mg via SUBCUTANEOUS
  Filled 2013-10-15 (×4): qty 0.4

## 2013-10-15 MED ORDER — ONDANSETRON HCL 4 MG PO TABS
4.0000 mg | ORAL_TABLET | Freq: Four times a day (QID) | ORAL | Status: DC | PRN
  Administered 2013-10-16: 4 mg via ORAL
  Filled 2013-10-15: qty 1

## 2013-10-15 MED ORDER — LISINOPRIL 20 MG PO TABS
20.0000 mg | ORAL_TABLET | Freq: Every day | ORAL | Status: DC
  Administered 2013-10-15 – 2013-10-19 (×5): 20 mg via ORAL
  Filled 2013-10-15 (×5): qty 1

## 2013-10-15 MED ORDER — HYDROMORPHONE HCL PF 1 MG/ML IJ SOLN
1.0000 mg | INTRAMUSCULAR | Status: DC | PRN
Start: 2013-10-15 — End: 2013-10-16
  Administered 2013-10-15 – 2013-10-16 (×3): 1 mg via INTRAVENOUS
  Filled 2013-10-15 (×3): qty 1

## 2013-10-15 MED ORDER — DIPHENHYDRAMINE HCL 50 MG/ML IJ SOLN
12.5000 mg | Freq: Once | INTRAMUSCULAR | Status: AC
  Administered 2013-10-15: 12.5 mg via INTRAVENOUS
  Filled 2013-10-15: qty 1

## 2013-10-15 MED ORDER — LEVOTHYROXINE SODIUM 88 MCG PO TABS
88.0000 ug | ORAL_TABLET | Freq: Every day | ORAL | Status: DC
  Administered 2013-10-15 – 2013-10-19 (×5): 88 ug via ORAL
  Filled 2013-10-15 (×7): qty 1

## 2013-10-15 NOTE — ED Notes (Signed)
Pt had a bowel movement, however, it appeared to just be the molasses enema. Sharol Given, MD is aware.

## 2013-10-15 NOTE — ED Provider Notes (Signed)
Care assumed from Dr Wilson Singer.  Pt with constipation x 1 month, abd pain, distension.    Results for orders placed during the hospital encounter of 10/14/13  CBC WITH DIFFERENTIAL      Result Value Ref Range   WBC 7.0  4.0 - 10.5 K/uL   RBC 4.60  3.87 - 5.11 MIL/uL   Hemoglobin 12.6  12.0 - 15.0 g/dL   HCT 38.0  36.0 - 46.0 %   MCV 82.6  78.0 - 100.0 fL   MCH 27.4  26.0 - 34.0 pg   MCHC 33.2  30.0 - 36.0 g/dL   RDW 13.3  11.5 - 15.5 %   Platelets 173  150 - 400 K/uL   Neutrophils Relative % 70  43 - 77 %   Neutro Abs 4.9  1.7 - 7.7 K/uL   Lymphocytes Relative 18  12 - 46 %   Lymphs Abs 1.3  0.7 - 4.0 K/uL   Monocytes Relative 11  3 - 12 %   Monocytes Absolute 0.8  0.1 - 1.0 K/uL   Eosinophils Relative 1  0 - 5 %   Eosinophils Absolute 0.1  0.0 - 0.7 K/uL   Basophils Relative 0  0 - 1 %   Basophils Absolute 0.0  0.0 - 0.1 K/uL  COMPREHENSIVE METABOLIC PANEL      Result Value Ref Range   Sodium 131 (*) 137 - 147 mEq/L   Potassium 4.2  3.7 - 5.3 mEq/L   Chloride 90 (*) 96 - 112 mEq/L   CO2 22  19 - 32 mEq/L   Glucose, Bld 98  70 - 99 mg/dL   BUN 19  6 - 23 mg/dL   Creatinine, Ser 0.72  0.50 - 1.10 mg/dL   Calcium 10.1  8.4 - 10.5 mg/dL   Total Protein 7.3  6.0 - 8.3 g/dL   Albumin 3.9  3.5 - 5.2 g/dL   AST 26  0 - 37 U/L   ALT 18  0 - 35 U/L   Alkaline Phosphatase 123 (*) 39 - 117 U/L   Total Bilirubin 0.7  0.3 - 1.2 mg/dL   GFR calc non Af Amer 82 (*) >90 mL/min   GFR calc Af Amer >90  >90 mL/min  URINALYSIS, ROUTINE W REFLEX MICROSCOPIC      Result Value Ref Range   Color, Urine YELLOW  YELLOW   APPearance CLEAR  CLEAR   Specific Gravity, Urine 1.024  1.005 - 1.030   pH 6.5  5.0 - 8.0   Glucose, UA NEGATIVE  NEGATIVE mg/dL   Hgb urine dipstick NEGATIVE  NEGATIVE   Bilirubin Urine NEGATIVE  NEGATIVE   Ketones, ur NEGATIVE  NEGATIVE mg/dL   Protein, ur NEGATIVE  NEGATIVE mg/dL   Urobilinogen, UA 0.2  0.0 - 1.0 mg/dL   Nitrite NEGATIVE  NEGATIVE   Leukocytes, UA  NEGATIVE  NEGATIVE   Ct Abdomen Pelvis W Contrast  10/14/2013   CLINICAL DATA:  Back and abdominal pain with decreased stool frequency  EXAM: CT ABDOMEN AND PELVIS WITH CONTRAST  TECHNIQUE: Multidetector CT imaging of the abdomen and pelvis was performed using the standard protocol following bolus administration of intravenous contrast.  CONTRAST:  193mL OMNIPAQUE IOHEXOL 300 MG/ML SOLN intravenously ; the patient also received oral contrast material  COMPARISON:  CT ABDOMEN W/O CM dated 09/03/2013  FINDINGS: The stomach contains most of the orally administered contrast. Only a small amount has entered the duodenum. There is subjective thickening of  the wall of the pylorus and proximal duodenum. The jejunum and ileum contain no contrast but exhibit no acute abnormalities.  The cecum and ascending colon and proximal transverse colon are quite distended with inspissated stool. There is no evidence of abnormal bowel wall thickening. More distally smaller amounts of stool and gas are present in the descending colon and rectosigmoid. There is no fecal impaction within the rectum.  The liver, pancreas, adrenal glands, and kidneys exhibit no acute abnormalities. There is a tiny calcified stone in the dependent portion of the gallbladder. There are hypodensities within the spleen which are chronic and likely reflect hemangiomas. These become less conspicuous on the delayed images. The caliber of the abdominal aorta is normal. There is no periaortic or pericaval lymphadenopathy. Within the pelvis the partially distended urinary bladder is grossly normal. The uterus is small and exhibits calcifications consistent with fibroids. No adnexal masses are demonstrated. There is no free pelvic fluid. There is no umbilical hernia. The patient has undergone previous ventral hernia repair. Inferior to the repair there is a focal shallow bulge in the peritoneum in the anterior pelvic wall.  The lumbar spine exhibits degenerative disc  change at multiple levels. There is mild endplate depression of L2 with a prominent posterior osteophyte. The bony pelvis exhibits no acute abnormality.  The lung bases exhibit minimal atelectasis in the extreme posterior costophrenic gutters.  IMPRESSION: 1. There is a large amount of stool in the cecum and ascending colon and mid transverse colon. More distally there is less fecal material and more gas. This pattern consistent with severe constipation. There is no evidence of enteritis or colitis or diverticulitis. 2. The small bowel is not abnormally distended. There is subjective thickening of the wall of the pylorus and first portion of the duodenum which may reflect peptic ulcer disease. There is no evidence of ulceration or mass. 3. Is a tiny calcified gallstone. There is no CT evidence of acute cholecystitis. 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   Electronically Signed   By: David  Martinique   On: 10/14/2013 21:51    Pt has received mag citrate, go lytely, and enema x 2 without improvement in constipation.  D/w hospitalist who will admit.  Kalman Drape, MD 10/15/13 [phone]

## 2013-10-15 NOTE — H&P (Signed)
[facility] History and Physical  [name] VOZ:[id number] DOB: [birth year] DOA: 10/14/2013  Referring physician: ER physician. PCP: Adella Hare, MD   Chief Complaint: Abdominal pain.  HPI: [name] is a 73 y.o. female with history of hypertension, postsurgical hypothyroidism, diastolic dysfunction, chronic back pain presents to the ER because of worsening abdominal pain over the last 24 hours. Patient denies any nausea vomiting or diarrhea and has not moved her bowels for almost a month now. In the ER CT abdomen and pelvis done shows severe constipation with possible peptic ulcer disease. Patient had GoLYTELY given along with magnesium citrate and also 2 doses of enema, soapsuds and milk of molasses despite which patient still has not moved bowels. Her dose of soapsuds edema was not properly retained and patient only had half the dose of GoLYTELY. Since patient is uncomfortable patient has been admitted for further workup. Patient also has been having increasing low back pain. Patient otherwise denies any chest pain or shortness of breath or any lower extremity weakness.  Review of Systems: As presented in the history of presenting illness, rest negative.  Past Medical History  Diagnosis Date  . Atrioventricular block, complete     a. 08/2011 Upgrage of PPM to MDT Adapta L Dual Chamber PPM ser # [id number].  . Diastolic dysfunction   . HTN (hypertension)   . Acute neck pain   . Tension headache   . Multinodular goiter   . Contact dermatitis and eczema     unspec cause  . Tooth abscess   . Thyroid nodule   . Osteoporosis   . GERD (gastroesophageal reflux disease)   . Depression   . Lung nodule   . Superficial thrombophlebitis   . Hypothyroidism   . Acute sinusitis   . Mitral regurgitation     a.03/2012 Echo: EF 65-70%, mod MR  . Chest pain     a. 09/2007 Myoview: EF 77%, no ischemia/infarct.  [name] PONV (postoperative nausea and vomiting)   . Pacemaker   . CHF  (congestive heart failure)    Past Surgical History  Procedure Laterality Date  . Laporatomy for ruptured diverticulum with colostomy  2003  . Take down of colostomy  2004  . Exploratory laparotomy    . Medtronic pacemaker  05/2003  . Hernia repair    . Colon surgery    . Thyroidectomy N/A 08/07/2012    Procedure: THYROIDECTOMY;  Surgeon: Earnstine Regal, MD;  Location: [facility];  Service: General;  Laterality: N/A;  . Colonoscopy  01/19/2013    colonoscopy   Social History:  reports that she quit smoking about 50 years ago. She has never used smokeless tobacco. She reports that she does not drink alcohol or use illicit drugs. Where does patient live home. Can patient participate in ADLs? Yes.  Allergies  Allergen Reactions  . Tape Rash  . Codeine     REACTION: N/V    Family History:  Family History  Problem Relation Age of Onset  . Heart disease Mother   . Heart failure Father   . Diverticulosis Sister   . Hypertension Brother   . Hypertension Brother   . Cancer Son     breast      Prior to Admission medications   Medication Sig Start Date End Date Taking? Authorizing Provider  calcium carbonate (OS-CAL - DOSED IN MG OF ELEMENTAL CALCIUM) 1250 MG tablet Take 500 mg by mouth 3 (three) times daily. 08/08/12  Yes Earnstine Regal,  MD  Chlorpheniramine-APAP (CORICIDIN) 2-325 MG TABS Take 2 tablets by mouth daily as needed. For allergies   Yes Historical Provider, MD  denosumab (PROLIA) 60 MG/ML SOLN Inject 60 mg into the skin every 6 (six) months. One injection every 6 months Last dose was in October, Next dose due in April   Yes Historical Provider, MD  diphenhydramine-acetaminophen (TYLENOL PM) 25-500 MG TABS Take 1 tablet by mouth at bedtime as needed (sleep).    Yes Historical Provider, MD  gabapentin (NEURONTIN) 300 MG capsule Take 300 mg by mouth 3 (three) times daily.   Yes Historical Provider, MD  levothyroxine (SYNTHROID, LEVOTHROID) 88 MCG tablet Take 1 tablet (88 mcg total)  by mouth daily before breakfast. 07/14/13  Yes Neena Rhymes, MD  lisinopril-hydrochlorothiazide (PRINZIDE,ZESTORETIC) 20-12.5 MG per tablet Take 1 tablet by mouth daily. 03/05/13  Yes Deboraha Sprang, MD  meloxicam (MOBIC) 15 MG tablet Take 15 mg by mouth daily.   Yes Historical Provider, MD  Multiple Vitamin (MULTIVITAMIN) capsule Take 1 capsule by mouth daily.    Yes Historical Provider, MD  omeprazole (PRILOSEC) 20 MG capsule Take 20 mg by mouth daily as needed. Heart burn   Yes Historical Provider, MD  docusate sodium (COLACE) 100 MG capsule Take 1 capsule (100 mg total) by mouth 2 (two) times daily as needed for mild constipation. 10/15/13   Virgel Manifold, MD  polyethylene glycol [facility] / Floria Raveling) packet Take 17 g by mouth 2 (two) times daily as needed for severe constipation. 10/15/13   Virgel Manifold, MD    Physical Exam: Filed Vitals:   10/15/13 0430 10/15/13 0500 10/15/13 0530 10/15/13 0631  BP: 133/70 136/67 118/70 148/74  Pulse: 83 86 92 80  Temp:    97.6 F (36.4 C)  TempSrc:    Oral  Resp:    18  Height:    5' 5.5" (1.664 m)  Weight:    67.541 kg (148 lb 14.4 oz)  SpO2: 97% 98% 99% 94%     General:  Well-developed and nourished.  Eyes: Anicteric no pallor.  ENT: No discharge from ears eyes nose mouth.  Neck: No mass felt.  Cardiovascular: S1-S2 heard.  Respiratory: No rhonchi or crepitations.  Abdomen: Soft distended nontender no guarding or rigidity bowel sounds not appreciated.  Skin: No rash.  Musculoskeletal: No edema.  Psychiatric: Appears normal.  Neurologic: Nonfocal.  Labs on Admission:  Basic Metabolic Panel:  Recent Labs Lab 10/14/13 1623  NA 131*  K 4.2  CL 90*  CO2 22  GLUCOSE 98  BUN 19  CREATININE 0.72  CALCIUM 10.1   Liver Function Tests:  Recent Labs Lab 10/14/13 1623  AST 26  ALT 18  ALKPHOS 123*  BILITOT 0.7  PROT 7.3  ALBUMIN 3.9   No results found for this basename: LIPASE, AMYLASE,  in the last 168 hours No  results found for this basename: AMMONIA,  in the last 168 hours CBC:  Recent Labs Lab 10/14/13 1623  WBC 7.0  NEUTROABS 4.9  HGB 12.6  HCT 38.0  MCV 82.6  PLT 173   Cardiac Enzymes: No results found for this basename: CKTOTAL, CKMB, CKMBINDEX, TROPONINI,  in the last 168 hours  BNP (last 3 results) No results found for this basename: PROBNP,  in the last 8760 hours CBG: No results found for this basename: GLUCAP,  in the last 168 hours  Radiological Exams on Admission: Ct Abdomen Pelvis W Contrast  10/14/2013   CLINICAL DATA:  Back  and abdominal pain with decreased stool frequency  EXAM: CT ABDOMEN AND PELVIS WITH CONTRAST  TECHNIQUE: Multidetector CT imaging of the abdomen and pelvis was performed using the standard protocol following bolus administration of intravenous contrast.  CONTRAST:  124mL OMNIPAQUE IOHEXOL 300 MG/ML SOLN intravenously ; the patient also received oral contrast material  COMPARISON:  CT ABDOMEN W/O CM dated 09/03/2013  FINDINGS: The stomach contains most of the orally administered contrast. Only a small amount has entered the duodenum. There is subjective thickening of the wall of the pylorus and proximal duodenum. The jejunum and ileum contain no contrast but exhibit no acute abnormalities.  The cecum and ascending colon and proximal transverse colon are quite distended with inspissated stool. There is no evidence of abnormal bowel wall thickening. More distally smaller amounts of stool and gas are present in the descending colon and rectosigmoid. There is no fecal impaction within the rectum.  The liver, pancreas, adrenal glands, and kidneys exhibit no acute abnormalities. There is a tiny calcified stone in the dependent portion of the gallbladder. There are hypodensities within the spleen which are chronic and likely reflect hemangiomas. These become less conspicuous on the delayed images. The caliber of the abdominal aorta is normal. There is no periaortic or  pericaval lymphadenopathy. Within the pelvis the partially distended urinary bladder is grossly normal. The uterus is small and exhibits calcifications consistent with fibroids. No adnexal masses are demonstrated. There is no free pelvic fluid. There is no umbilical hernia. The patient has undergone previous ventral hernia repair. Inferior to the repair there is a focal shallow bulge in the peritoneum in the anterior pelvic wall.  The lumbar spine exhibits degenerative disc change at multiple levels. There is mild endplate depression of L2 with a prominent posterior osteophyte. The bony pelvis exhibits no acute abnormality.  The lung bases exhibit minimal atelectasis in the extreme posterior costophrenic gutters.  IMPRESSION: 1. There is a large amount of stool in the cecum and ascending colon and mid transverse colon. More distally there is less fecal material and more gas. This pattern consistent with severe constipation. There is no evidence of enteritis or colitis or diverticulitis. 2. The small bowel is not abnormally distended. There is subjective thickening of the wall of the pylorus and first portion of the duodenum which may reflect peptic ulcer disease. There is no evidence of ulceration or mass. 3. Is a tiny calcified gallstone. There is no CT evidence of acute cholecystitis. 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   Electronically Signed   By: David  Martinique   On: 10/14/2013 21:51     Assessment/Plan Principal Problem:   Abdominal pain Active Problems:   HYPERTENSION   DIASTOLIC DYSFUNCTION   Pacemaker-Medtronic   Hypothyroidism, postsurgical   Constipation   PUD (peptic ulcer disease)   Low back pain   1. Abdominal pain probably from severe constipation - I have ordered one more dose of soaps SUDS enema. If there is no much result then consult gastroenterology. Check TSH. 2. Possible peptic ulcer disease per CAT scan -  patient is on PPI. I have advised patient to avoid NSAIDs. Will eventually need GI evaluation given the age. 3. Chronic low back pain probably worsened more by constipation - continue gabapentin and we will try to avoid narcotics due to constipation. 4. History of complete heart block status post pacemaker placement. 5. Hypertension - continue lisinopril. Hold HCTZ as patient is gently hydrated. 6. Postsurgical hypothyroidism -  check TSH. 7. History of diastolic dysfunction - caution in giving IV fluids.    Code Status: Full code.  Family Communication: daughter at the bedside.  Disposition Plan: Admit to inpatient.    [facility] Pager [phone].  If 7PM-7AM, please contact night-coverage [email or web address] Password [email or web address] 10/15/2013, 6:55 AM

## 2013-10-15 NOTE — Progress Notes (Signed)
PROGRESS NOTE    [name] ZJI:[id number] DOB: 11-04-1940 DOA: 10/14/2013 PCP: Adella Hare, MD Primary Gastroenterologist: Dr. Ronald Lobo Primary Cardiologist/EPS: Dr. Virl Axe  HPI/Brief narrative 73 year old female with history of hypertension, postsurgical hypothyroidism, diastolic dysfunction, chronic back pain of unclear etiology, admitted on 10/15/13 with worsening abdominal pain over the last 24 hours, no BM for approximately a month despite several home measures. In the ED, CT abdomen and pelvis showed fecal impaction.   Assessment/Plan:  1. Fecal impaction: Unclear etiology. Not on chronic opioids. TSH normal. She states that she has tried several measures including increased intake of fruits, vegetables, fruit juice, prunes, OTC laxatives and suppositories. Status post GoLYTELY and milk and molasses enema-patient had a very large BM this morning. Discussed with primary GI on 4/17-last colonoscopy July 2014 only significant for diverticulosis. Start on bowel regimen-MiraLAX 17 g twice a day and senna 2 tabs twice a day. Followup KUB. Continue clear liquids for today and we'll advance based on clinical course and KUB. 2. Chronic low back pain: Unclear etiology. CT abdomen: Lumbar spine shows degenerative disc changes at multiple levels and mild endplate depression of L2 with a prominent posterior osteophyte. Possibly secondary to DJD. Patient takes scheduled Tylenol-continue. Continue Neurontin. Ambulate with assistance. 3. Postsurgical hypothyroidism: TSH normal. 4. History of complete heart block status post PPM: Stable. 5. Hypertension: Controlled. Continue lisinopril. HCTZ on hold while patient being hydrated. 6. History of diastolic dysfunction: Stable.   Code Status: Full Family Communication: Discussed with daughter at bedside. Disposition Plan: Home when medically stable.   Consultants:  None  Procedures:  None  Antibiotics:  None    Subjective: Feels much better. Had a large BM 4/17. Abdomen feels sore but no pain. No nausea or vomiting. Appetite poor. Chronic low back pain.  Objective: Filed Vitals:   10/15/13 0500 10/15/13 0530 10/15/13 0631 10/15/13 1027  BP: 136/67 118/70 148/74 110/64  Pulse: 86 92 80   Temp:   97.6 F (36.4 C)   TempSrc:   Oral   Resp:   18   Height:   5' 5.5" (1.664 m)   Weight:   67.541 kg (148 lb 14.4 oz)   SpO2: 98% 99% 94%    No intake or output data in the 24 hours ending 10/15/13 1153 Filed Weights   10/14/13 1619 10/15/13 0631  Weight: 64.411 kg (142 lb) 67.541 kg (148 lb 14.4 oz)     Exam:  General exam: Pleasant elderly female lying comfortably in bed. Respiratory system: Clear. No increased work of breathing. Cardiovascular system: S1 & S2 heard, RRR. No JVD, murmurs, gallops, clicks. Trace bilateral ankle edema Gastrointestinal system: Abdomen is mildly distended but soft and nontender. Normal bowel sounds heard. Central nervous system: Alert and oriented. No focal neurological deficits. Extremities: Symmetric 5 x 5 power.   Data Reviewed: Basic Metabolic Panel:  Recent Labs Lab 10/14/13 1623 10/15/13 0752  NA 131* 136*  K 4.2 4.5  CL 90* 95*  CO2 22 24  GLUCOSE 98 101*  BUN 19 20  CREATININE 0.72 0.60  CALCIUM 10.1 10.3   Liver Function Tests:  Recent Labs Lab 10/14/13 1623 10/15/13 0752  AST 26 30  ALT 18 22  ALKPHOS 123* 127*  BILITOT 0.7 0.6  PROT 7.3 6.9  ALBUMIN 3.9 3.7   No results found for this basename: LIPASE, AMYLASE,  in the last 168 hours No results found for this basename: AMMONIA,  in the last 168 hours CBC:  Recent Labs Lab 10/14/13 1623 10/15/13 0752  WBC 7.0 5.9  NEUTROABS 4.9 4.9  HGB 12.6 12.3  HCT 38.0 37.2  MCV 82.6 82.7  PLT 173 160   Cardiac Enzymes: No results found for this basename: CKTOTAL, CKMB, CKMBINDEX, TROPONINI,  in the last 168 hours BNP (last 3 results) No results found for this basename:  PROBNP,  in the last 8760 hours CBG: No results found for this basename: GLUCAP,  in the last 168 hours  Recent Results (from the past 240 hour(s))  MRSA PCR SCREENING     Status: None   Collection Time    10/15/13  7:17 AM      Result Value Ref Range Status   MRSA by PCR NEGATIVE  NEGATIVE Final   Comment:            The GeneXpert MRSA Assay (FDA     approved for NASAL specimens     only), is one component of a     comprehensive MRSA colonization     surveillance program. It is not     intended to diagnose MRSA     infection nor to guide or     monitor treatment for     MRSA infections.      Additional labs: 1. TSH: 3.580     Studies: Ct Abdomen Pelvis W Contrast  10/14/2013   CLINICAL DATA:  Back and abdominal pain with decreased stool frequency  EXAM: CT ABDOMEN AND PELVIS WITH CONTRAST  TECHNIQUE: Multidetector CT imaging of the abdomen and pelvis was performed using the standard protocol following bolus administration of intravenous contrast.  CONTRAST:  123mL OMNIPAQUE IOHEXOL 300 MG/ML SOLN intravenously ; the patient also received oral contrast material  COMPARISON:  CT ABDOMEN W/O CM dated 09/03/2013  FINDINGS: The stomach contains most of the orally administered contrast. Only a small amount has entered the duodenum. There is subjective thickening of the wall of the pylorus and proximal duodenum. The jejunum and ileum contain no contrast but exhibit no acute abnormalities.  The cecum and ascending colon and proximal transverse colon are quite distended with inspissated stool. There is no evidence of abnormal bowel wall thickening. More distally smaller amounts of stool and gas are present in the descending colon and rectosigmoid. There is no fecal impaction within the rectum.  The liver, pancreas, adrenal glands, and kidneys exhibit no acute abnormalities. There is a tiny calcified stone in the dependent portion of the gallbladder. There are hypodensities within the spleen  which are chronic and likely reflect hemangiomas. These become less conspicuous on the delayed images. The caliber of the abdominal aorta is normal. There is no periaortic or pericaval lymphadenopathy. Within the pelvis the partially distended urinary bladder is grossly normal. The uterus is small and exhibits calcifications consistent with fibroids. No adnexal masses are demonstrated. There is no free pelvic fluid. There is no umbilical hernia. The patient has undergone previous ventral hernia repair. Inferior to the repair there is a focal shallow bulge in the peritoneum in the anterior pelvic wall.  The lumbar spine exhibits degenerative disc change at multiple levels. There is mild endplate depression of L2 with a prominent posterior osteophyte. The bony pelvis exhibits no acute abnormality.  The lung bases exhibit minimal atelectasis in the extreme posterior costophrenic gutters.  IMPRESSION: 1. There is a large amount of stool in the cecum and ascending colon and mid transverse colon. More distally there is less fecal material and more gas. This pattern consistent  with severe constipation. There is no evidence of enteritis or colitis or diverticulitis. 2. The small bowel is not abnormally distended. There is subjective thickening of the wall of the pylorus and first portion of the duodenum which may reflect peptic ulcer disease. There is no evidence of ulceration or mass. 3. Is a tiny calcified gallstone. There is no CT evidence of acute cholecystitis. 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   Electronically Signed   By: David  Martinique   On: 10/14/2013 21:51        Scheduled Meds: . acetaminophen  1,000 mg Oral TID  . enoxaparin (LOVENOX) injection  40 mg Subcutaneous Q24H  . gabapentin  300 mg Oral TID  . levothyroxine  88 mcg Oral QAC breakfast  . lisinopril  20 mg Oral Daily  . pantoprazole  40 mg Oral Daily  . polyethylene  glycol  17 g Oral BID  . senna  2 tablet Oral BID   Continuous Infusions: . sodium chloride 50 mL/hr (10/15/13 0933)    Principal Problem:   Abdominal pain Active Problems:   HYPERTENSION   DIASTOLIC DYSFUNCTION   Pacemaker-Medtronic   Hypothyroidism, postsurgical   Constipation   PUD (peptic ulcer disease)   Low back pain    Time spent: 30 minutes    Modena Jansky, MD, FACP, [facility]. [facility] Pager [phone]  If 7PM-7AM, please contact night-coverage [email or web address] Password [email or web address] 10/15/2013, 11:53 AM    LOS: 1 day

## 2013-10-15 NOTE — Discharge Instructions (Signed)

## 2013-10-15 NOTE — Care Management Note (Signed)
    Page 1 of 1   10/19/2013     12:29:28 PM CARE MANAGEMENT NOTE 10/19/2013  Patient:  [name], [name]   Account Number:  [id number]  Date Initiated:  10/15/2013  Documentation initiated by:  Tomi Bamberger  Subjective/Objective Assessment:   dx abd pain  admit  changed to observation  4/17- from home.     Action/Plan:   Anticipated DC Date:  10/19/2013   Anticipated DC Plan:  [place]  CM consult      Choice offered to / List presented to:             Status of service:  Completed, signed off Medicare Important Message given?   (If response is "NO", the following Medicare IM given date fields will be blank) Date Medicare IM given:   Date Additional Medicare IM given:    Discharge Disposition:  HOME/SELF CARE  Per UR Regulation:  Reviewed for med. necessity/level of care/duration of stay  If discussed at [place] of Stay Meetings, dates discussed:    Comments:  10/19/13 [place], BSN [phone] patient is s/p vertebraplasty/kyphoplasty, per atending patient is for dc today.  10/15/13 [place], BSN  [phone] Patient converted to observation patient on 4/17, code 44 given to patient, attending and med mgt MD's in agreement with observation status.  Code 44 Statement put in shadow chart.

## 2013-10-15 NOTE — Progress Notes (Signed)
Pt arrived to unit alert and oriented x4. Oriented to room, unit, and staff.  Bed in lowest position and call bell is within reach. Will continue to monitor. 

## 2013-10-15 NOTE — ED Notes (Signed)
Pt is now vomiting brown emesis.

## 2013-10-15 NOTE — Progress Notes (Signed)
Received report from Anne, RN

## 2013-10-16 ENCOUNTER — Observation Stay (HOSPITAL_COMMUNITY): Payer: Medicare Other

## 2013-10-16 DIAGNOSIS — I10 Essential (primary) hypertension: Secondary | ICD-10-CM

## 2013-10-16 DIAGNOSIS — K5641 Fecal impaction: Secondary | ICD-10-CM | POA: Diagnosis present

## 2013-10-16 DIAGNOSIS — M4850XA Collapsed vertebra, not elsewhere classified, site unspecified, initial encounter for fracture: Secondary | ICD-10-CM | POA: Diagnosis present

## 2013-10-16 LAB — BASIC METABOLIC PANEL
BUN: 20 mg/dL (ref 6–23)
CHLORIDE: 99 meq/L (ref 96–112)
CO2: 26 meq/L (ref 19–32)
CREATININE: 0.75 mg/dL (ref 0.50–1.10)
Calcium: 9.3 mg/dL (ref 8.4–10.5)
GFR calc Af Amer: >90 mL/min (ref >90)
GFR calc non Af Amer: 81 mL/min — ABNORMAL LOW (ref >90)
GLUCOSE: 104 mg/dL — AB (ref 70–99)
Potassium: 4.1 mEq/L (ref 3.7–5.3)
Sodium: 136 mEq/L — ABNORMAL LOW (ref 137–147)

## 2013-10-16 MED ORDER — TRAMADOL HCL 50 MG PO TABS
50.0000 mg | ORAL_TABLET | Freq: Four times a day (QID) | ORAL | Status: DC | PRN
  Administered 2013-10-16 – 2013-10-17 (×3): 50 mg via ORAL
  Filled 2013-10-16 (×3): qty 1

## 2013-10-16 MED ORDER — HYDROMORPHONE HCL PF 1 MG/ML IJ SOLN
1.0000 mg | INTRAMUSCULAR | Status: DC | PRN
  Administered 2013-10-16 – 2013-10-17 (×2): 1 mg via INTRAVENOUS
  Filled 2013-10-16 (×2): qty 1

## 2013-10-16 MED ORDER — OXYCODONE HCL 5 MG PO TABS: 5.0000 mg | ORAL_TABLET | Freq: Once | ORAL | Status: DC

## 2013-10-16 MED ORDER — HYDROMORPHONE HCL PF 1 MG/ML IJ SOLN
1.0000 mg | Freq: Once | INTRAMUSCULAR | Status: DC
  Filled 2013-10-16: qty 1

## 2013-10-16 NOTE — Progress Notes (Signed)
PROGRESS NOTE    [name] OEU:[id number] DOB: 1940/11/10 DOA: 10/14/2013 PCP: Adella Hare, MD Primary Gastroenterologist: Dr. Ronald Lobo Primary Cardiologist/EPS: Dr. Virl Axe  HPI/Brief narrative 73 year old female with history of hypertension, postsurgical hypothyroidism, diastolic dysfunction, chronic back pain of unclear etiology, admitted on 10/15/13 with worsening abdominal pain over the last 24 hours, no BM for approximately a month despite several home measures. In the ED, CT abdomen and pelvis showed fecal impaction.   Assessment/Plan:  1. Fecal impaction: Unclear etiology. Not on chronic opioids. TSH normal. She states that she has tried several measures including increased intake of fruits, vegetables, fruit juice, prunes, OTC laxatives and suppositories. Patient has had several BMs since admission-now watery. Discussed with primary GI on 4/17-last colonoscopy July 2014 only significant for diverticulosis. Start on bowel regimen-MiraLAX 17 g twice a day and senna 2 tabs twice a day. KUB 4/17 showed improved advance diet to regular consistency diet. Reduce stool softeners due to diarrhea. 2. Acute on chronic back pain/vertebral compression fractures: CT abdomen: Lumbar spine shows degenerative disc changes at multiple levels and mild endplate depression of L2 with a prominent posterior osteophyte. After discussing with radiologist on 4/17, requested bone scan. Bone scan shows findings consistent with L2, L4 & T9 acute compression fractures and T10-12 subacute compression fractures. Not sure if there will be any invasive intervention possible-will discuss with IR. Pain management 3. Postsurgical hypothyroidism: TSH normal. 4. History of complete heart block status post PPM: Stable. 5. Hypertension: Controlled. Continue lisinopril. HCTZ on hold while patient being hydrated. 6. History of diastolic dysfunction: Stable.   Code Status: Full Family Communication:  Discussed with daughter at bedside on 4/17. Disposition Plan: Home when medically stable.   Consultants:  None  Procedures:  None  Antibiotics:  None   Subjective: Several watery BMs. Abdomen feels slightly sore. Asking for solid food. Back pain controlled on pain medications.  Objective: Filed Vitals:   10/15/13 0631 10/15/13 1027 10/15/13 2014 10/16/13 0340  BP: 148/74 110/64 96/56 133/74  Pulse: 80  82 80  Temp: 97.6 F (36.4 C)  97.9 F (36.6 C) 97.9 F (36.6 C)  TempSrc: Oral  Oral Oral  Resp: 18  18 18   Height: 5' 5.5" (1.664 m)     Weight: 67.541 kg (148 lb 14.4 oz)     SpO2: 94%  93% 94%    Intake/Output Summary (Last 24 hours) at 10/16/13 1453 Last data filed at 10/16/13 1000  Gross per 24 hour  Intake 638.33 ml  Output      0 ml  Net 638.33 ml   Filed Weights   10/14/13 1619 10/15/13 0631  Weight: 64.411 kg (142 lb) 67.541 kg (148 lb 14.4 oz)     Exam:  General exam: Pleasant elderly female seen ambulating steadily in the room. Respiratory system: Clear. No increased work of breathing. Cardiovascular system: S1 & S2 heard, RRR. No JVD, murmurs, gallops, clicks. Trace bilateral ankle edema Gastrointestinal system: Abdomen is mildly distended but soft and nontender. Normal bowel sounds heard. Central nervous system: Alert and oriented. No focal neurological deficits. Extremities: Symmetric 5 x 5 power.   Data Reviewed: Basic Metabolic Panel:  Recent Labs Lab 10/14/13 1623 10/15/13 0752 10/16/13 0616  NA 131* 136* 136*  K 4.2 4.5 4.1  CL 90* 95* 99  CO2 22 24 26   GLUCOSE 98 101* 104*  BUN 19 20 20   CREATININE 0.72 0.60 0.75  CALCIUM 10.1 10.3 9.3   Liver Function Tests:  Recent Labs Lab 10/14/13 1623 10/15/13 0752  AST 26 30  ALT 18 22  ALKPHOS 123* 127*  BILITOT 0.7 0.6  PROT 7.3 6.9  ALBUMIN 3.9 3.7   No results found for this basename: LIPASE, AMYLASE,  in the last 168 hours No results found for this basename: AMMONIA,   in the last 168 hours CBC:  Recent Labs Lab 10/14/13 1623 10/15/13 0752  WBC 7.0 5.9  NEUTROABS 4.9 4.9  HGB 12.6 12.3  HCT 38.0 37.2  MCV 82.6 82.7  PLT 173 160   Cardiac Enzymes: No results found for this basename: CKTOTAL, CKMB, CKMBINDEX, TROPONINI,  in the last 168 hours BNP (last 3 results) No results found for this basename: PROBNP,  in the last 8760 hours CBG: No results found for this basename: GLUCAP,  in the last 168 hours  Recent Results (from the past 240 hour(s))  MRSA PCR SCREENING     Status: None   Collection Time    10/15/13  7:17 AM      Result Value Ref Range Status   MRSA by PCR NEGATIVE  NEGATIVE Final   Comment:            The GeneXpert MRSA Assay (FDA     approved for NASAL specimens     only), is one component of a     comprehensive MRSA colonization     surveillance program. It is not     intended to diagnose MRSA     infection nor to guide or     monitor treatment for     MRSA infections.      Additional labs: 1. TSH: 3.580     Studies: Dg Abd 1 View  10/15/2013   CLINICAL DATA:  Fecal impaction. Low back pain radiating to the hips.  EXAM: ABDOMEN - 1 VIEW  COMPARISON:  CT ABD/PELVIS W CM dated 10/14/2013  FINDINGS: Blunted left lateral costophrenic angle.  Mild cardiomegaly.  Hernia mesh markers noted. Oral contrast medium is observed in the large bowel and in the urinary bladder, left over from CT scan yesterday, with significant reduction in the amount of colonic stool. No dilated small bowel observed.  IMPRESSION: 1. Significant interval clearing of stool from the colon. There is oral contrast in the colon left over from yesterday's CT scan. 2. Increased blunting of the left lateral costophrenic angle -a small left pleural effusion is not excluded.   Electronically Signed   By: Sherryl Barters M.D.   On: 10/15/2013 12:50   Nm Bone Scan Whole Body  10/16/2013   CLINICAL DATA:  Lower back pain since early March 2015. Multiple  compression fractures on CT. Evaluate for acuity.  EXAM: NUCLEAR MEDICINE WHOLE BODY BONE SCAN  TECHNIQUE: Whole body anterior and posterior images were obtained approximately 3 hours after intravenous injection of radiopharmaceutical.  RADIOPHARMACEUTICALS:  25.0 mCi of Technetium-99 MDP  COMPARISON:  CT scan abdomen/ pelvis 10/14/2013 and 09/03/2013  FINDINGS: Focally increased radiotracer uptake noted in the superior endplate of L2 and L4 consistent with likely acute compression fractures after correlation with prior CT imaging. Additional more subtle increased radiotracer uptake is noted in the T9, T10, T11 and T12 endplates.  Focal radiotracer uptake in the left anterior humerus likely at the injection site. Normal tracer uptake in the kidneys and renal collecting system. Trace degenerative changes in the bilateral knees and shoulders and a thumb MCP joints.  IMPRESSION: 1. Focally increased radiotracer uptake in the superior endplates of L2  and L4 consistent with acute compression fractures. 2. More subtly increased radiotracer uptake in the T9, T10, T11 and T12 endplates is less specific. Suspect subacute compression fractures given the appearance on CT. Of note, no definite T9 fracture was noted on the prior CT scan. This may be the most acute region of injury.   Electronically Signed   By: Jacqulynn Cadet M.D.   On: 10/16/2013 13:07   Ct Abdomen Pelvis W Contrast  10/14/2013   CLINICAL DATA:  Back and abdominal pain with decreased stool frequency  EXAM: CT ABDOMEN AND PELVIS WITH CONTRAST  TECHNIQUE: Multidetector CT imaging of the abdomen and pelvis was performed using the standard protocol following bolus administration of intravenous contrast.  CONTRAST:  170mL OMNIPAQUE IOHEXOL 300 MG/ML SOLN intravenously ; the patient also received oral contrast material  COMPARISON:  CT ABDOMEN W/O CM dated 09/03/2013  FINDINGS: The stomach contains most of the orally administered contrast. Only a small amount has  entered the duodenum. There is subjective thickening of the wall of the pylorus and proximal duodenum. The jejunum and ileum contain no contrast but exhibit no acute abnormalities.  The cecum and ascending colon and proximal transverse colon are quite distended with inspissated stool. There is no evidence of abnormal bowel wall thickening. More distally smaller amounts of stool and gas are present in the descending colon and rectosigmoid. There is no fecal impaction within the rectum.  The liver, pancreas, adrenal glands, and kidneys exhibit no acute abnormalities. There is a tiny calcified stone in the dependent portion of the gallbladder. There are hypodensities within the spleen which are chronic and likely reflect hemangiomas. These become less conspicuous on the delayed images. The caliber of the abdominal aorta is normal. There is no periaortic or pericaval lymphadenopathy. Within the pelvis the partially distended urinary bladder is grossly normal. The uterus is small and exhibits calcifications consistent with fibroids. No adnexal masses are demonstrated. There is no free pelvic fluid. There is no umbilical hernia. The patient has undergone previous ventral hernia repair. Inferior to the repair there is a focal shallow bulge in the peritoneum in the anterior pelvic wall.  The lumbar spine exhibits degenerative disc change at multiple levels. There is mild endplate depression of L2 with a prominent posterior osteophyte. The bony pelvis exhibits no acute abnormality.  The lung bases exhibit minimal atelectasis in the extreme posterior costophrenic gutters.  IMPRESSION: 1. There is a large amount of stool in the cecum and ascending colon and mid transverse colon. More distally there is less fecal material and more gas. This pattern consistent with severe constipation. There is no evidence of enteritis or colitis or diverticulitis. 2. The small bowel is not abnormally distended. There is subjective thickening of  the wall of the pylorus and first portion of the duodenum which may reflect peptic ulcer disease. There is no evidence of ulceration or mass. 3. Is a tiny calcified gallstone. There is no CT evidence of acute cholecystitis. 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   Electronically Signed   By: David  [place]   On: 10/14/2013 21:51        Scheduled Meds: . acetaminophen  1,000 mg Oral TID  . enoxaparin (LOVENOX) injection  40 mg Subcutaneous Q24H  . gabapentin  300 mg Oral TID  .  HYDROmorphone (DILAUDID) injection  1 mg Intravenous Once  . levothyroxine  88 mcg Oral QAC breakfast  . lisinopril  20 mg Oral Daily  .  oxyCODONE  5 mg Oral Once  . pantoprazole  40 mg Oral Daily  . polyethylene glycol  17 g Oral BID  . senna  2 tablet Oral BID   Continuous Infusions:    Principal Problem:   Abdominal pain Active Problems:   HYPERTENSION   DIASTOLIC DYSFUNCTION   Pacemaker-Medtronic   Hypothyroidism, postsurgical   Constipation   PUD (peptic ulcer disease)   Low back pain    Time spent: 30 minutes    Modena Jansky, MD, FACP, [facility]. [facility] Pager [phone]  If 7PM-7AM, please contact night-coverage [email or web address] Password [email or web address] 10/16/2013, 2:53 PM    LOS: 2 days

## 2013-10-16 NOTE — Progress Notes (Signed)
UR Completed Marijo Quizon Graves-Bigelow, RN,BSN [phone]  

## 2013-10-17 MED ORDER — DIPHENHYDRAMINE HCL 25 MG PO CAPS
25.0000 mg | ORAL_CAPSULE | Freq: Every evening | ORAL | Status: DC | PRN
  Administered 2013-10-17 – 2013-10-18 (×2): 25 mg via ORAL
  Filled 2013-10-17 (×2): qty 1

## 2013-10-17 MED ORDER — TRAMADOL HCL 50 MG PO TABS: 100.0000 mg | ORAL_TABLET | Freq: Four times a day (QID) | ORAL | Status: DC | PRN

## 2013-10-17 MED ORDER — TRAMADOL HCL 50 MG PO TABS
50.0000 mg | ORAL_TABLET | Freq: Four times a day (QID) | ORAL | Status: DC | PRN
  Administered 2013-10-17: 50 mg via ORAL
  Filled 2013-10-17 (×2): qty 1

## 2013-10-17 NOTE — Progress Notes (Signed)
PROGRESS NOTE    [name] LKG:[id number] DOB: 04/14/1941 DOA: 10/14/2013 PCP: Adella Hare, MD Primary Gastroenterologist: Dr. Ronald Lobo Primary Cardiologist/EPS: Dr. Virl Axe  HPI/Brief narrative 73 year old female with history of hypertension, postsurgical hypothyroidism, diastolic dysfunction, chronic back pain of unclear etiology, admitted on 10/15/13 with worsening abdominal pain over the last 24 hours, no BM for approximately a month despite several home measures. In the ED, CT abdomen and pelvis showed fecal impaction.   Assessment/Plan:  1. Fecal impaction: Unclear etiology. Not on chronic opioids. TSH normal. She states that she has tried several measures including increased intake of fruits, vegetables, fruit juice, prunes, OTC laxatives and suppositories. Patient has had several BMs since admission-now watery. Discussed with primary GI on 4/17-last colonoscopy July 2014 only significant for diverticulosis. Start on bowel regimen-MiraLAX 17 g twice a day and senna 2 tabs twice a day. KUB 4/17 showed improved advance diet to regular consistency diet. Reduce Miralax and stool softeners due to watery stools albeit once daily. 2. Acute on chronic back pain/vertebral compression fractures: CT abdomen: Lumbar spine shows degenerative disc changes at multiple levels and mild endplate depression of L2 with a prominent posterior osteophyte. After discussing with radiologist on 4/17, requested bone scan. Bone scan shows findings consistent with L2, L4 & T9 acute compression fractures and T10-12 subacute compression fractures. Not sure if there will be any invasive intervention possible-discussed with IR on call 4/19 and was asked to check in AM for specialized IR Radiologist not here to day. Pain management- try PO Tramadol 3. Postsurgical hypothyroidism: TSH normal. 4. History of complete heart block status post PPM: Stable. 5. Hypertension: Controlled. Continue lisinopril. HCTZ  on hold while patient being hydrated. 6. History of diastolic dysfunction: Stable.   Code Status: Full Family Communication: Discussed with daughter at bedside on 4/17. Disposition Plan: Home pending IR opinion on 4/20   Consultants:  None  Procedures:  None  Antibiotics:  None   Subjective: Watery BM this morning. Back pain controlled with PO & IV meds.  Objective: Filed Vitals:   10/16/13 0340 10/16/13 1530 10/16/13 2134 10/17/13 0530  BP: 133/74 144/76 113/62 130/71  Pulse: 80 71 79 73  Temp: 97.9 F (36.6 C) 97.4 F (36.3 C) 97.4 F (36.3 C) 97.9 F (36.6 C)  TempSrc: Oral Oral Axillary Oral  Resp: 18 18 18 18   Height:      Weight:      SpO2: 94% 94% 96% 95%   No intake or output data in the 24 hours ending 10/17/13 1416 Filed Weights   10/14/13 1619 10/15/13 0631  Weight: 64.411 kg (142 lb) 67.541 kg (148 lb 14.4 oz)     Exam:  General exam: Pleasant elderly female seen sitting up eating breakfast this morning. Respiratory system: Clear. No increased work of breathing. Cardiovascular system: S1 & S2 heard, RRR. No JVD, murmurs, gallops, clicks. Trace bilateral ankle edema Gastrointestinal system: Abdomen is mildly distended but soft and nontender. Normal bowel sounds heard. Central nervous system: Alert and oriented. No focal neurological deficits. Extremities: Symmetric 5 x 5 power.   Data Reviewed: Basic Metabolic Panel:  Recent Labs Lab 10/14/13 1623 10/15/13 0752 10/16/13 0616  NA 131* 136* 136*  K 4.2 4.5 4.1  CL 90* 95* 99  CO2 22 24 26   GLUCOSE 98 101* 104*  BUN 19 20 20   CREATININE 0.72 0.60 0.75  CALCIUM 10.1 10.3 9.3   Liver Function Tests:  Recent Labs Lab 10/14/13 1623 10/15/13 0752  AST 26 30  ALT 18 22  ALKPHOS 123* 127*  BILITOT 0.7 0.6  PROT 7.3 6.9  ALBUMIN 3.9 3.7   No results found for this basename: LIPASE, AMYLASE,  in the last 168 hours No results found for this basename: AMMONIA,  in the last 168  hours CBC:  Recent Labs Lab 10/14/13 1623 10/15/13 0752  WBC 7.0 5.9  NEUTROABS 4.9 4.9  HGB 12.6 12.3  HCT 38.0 37.2  MCV 82.6 82.7  PLT 173 160   Cardiac Enzymes: No results found for this basename: CKTOTAL, CKMB, CKMBINDEX, TROPONINI,  in the last 168 hours BNP (last 3 results) No results found for this basename: PROBNP,  in the last 8760 hours CBG: No results found for this basename: GLUCAP,  in the last 168 hours  Recent Results (from the past 240 hour(s))  MRSA PCR SCREENING     Status: None   Collection Time    10/15/13  7:17 AM      Result Value Ref Range Status   MRSA by PCR NEGATIVE  NEGATIVE Final   Comment:            The GeneXpert MRSA Assay (FDA     approved for NASAL specimens     only), is one component of a     comprehensive MRSA colonization     surveillance program. It is not     intended to diagnose MRSA     infection nor to guide or     monitor treatment for     MRSA infections.      Additional labs: 1. TSH: 3.580     Studies: Nm Bone Scan Whole Body  10/16/2013   CLINICAL DATA:  Lower back pain since early March 2015. Multiple compression fractures on CT. Evaluate for acuity.  EXAM: NUCLEAR MEDICINE WHOLE BODY BONE SCAN  TECHNIQUE: Whole body anterior and posterior images were obtained approximately 3 hours after intravenous injection of radiopharmaceutical.  RADIOPHARMACEUTICALS:  25.0 mCi of Technetium-99 MDP  COMPARISON:  CT scan abdomen/ pelvis 10/14/2013 and 09/03/2013  FINDINGS: Focally increased radiotracer uptake noted in the superior endplate of L2 and L4 consistent with likely acute compression fractures after correlation with prior CT imaging. Additional more subtle increased radiotracer uptake is noted in the T9, T10, T11 and T12 endplates.  Focal radiotracer uptake in the left anterior humerus likely at the injection site. Normal tracer uptake in the kidneys and renal collecting system. Trace degenerative changes in the bilateral  knees and shoulders and a thumb MCP joints.  IMPRESSION: 1. Focally increased radiotracer uptake in the superior endplates of L2 and L4 consistent with acute compression fractures. 2. More subtly increased radiotracer uptake in the T9, T10, T11 and T12 endplates is less specific. Suspect subacute compression fractures given the appearance on CT. Of note, no definite T9 fracture was noted on the prior CT scan. This may be the most acute region of injury.   Electronically Signed   By: Jacqulynn Cadet M.D.   On: 10/16/2013 13:07        Scheduled Meds: . acetaminophen  1,000 mg Oral TID  . enoxaparin (LOVENOX) injection  40 mg Subcutaneous Q24H  . gabapentin  300 mg Oral TID  .  HYDROmorphone (DILAUDID) injection  1 mg Intravenous Once  . levothyroxine  88 mcg Oral QAC breakfast  . lisinopril  20 mg Oral Daily  . oxyCODONE  5 mg Oral Once  . pantoprazole  40 mg Oral Daily  .  polyethylene glycol  17 g Oral BID  . senna  2 tablet Oral BID   Continuous Infusions:    Principal Problem:   Fecal impaction Active Problems:   HYPERTENSION   DIASTOLIC DYSFUNCTION   Pacemaker-[employer]tr[employer]   Hypothyroidism, postsurgical   Constipation   Abdominal pain   PUD (peptic ulcer disease)   Low back pain   Vertebral compression fracture    Time spent: 20 minutes    Modena Jansky, MD, FACP, [facility]. [facility] Pager [phone]  If 7PM-7AM, please contact night-coverage [email or web address] Password [email or web address] 10/17/2013, 2:16 PM    LOS: 3 days

## 2013-10-17 NOTE — Plan of Care (Cosign Needed)
Per pt request dose of Ultram decreased from 100 to 50 mg  520 am: RN paged back to say pt now wants the 100 mg dose  [name], ANP

## 2013-10-18 ENCOUNTER — Observation Stay (HOSPITAL_COMMUNITY): Payer: Medicare Other

## 2013-10-18 ENCOUNTER — Other Ambulatory Visit (HOSPITAL_COMMUNITY): Payer: Self-pay | Admitting: Interventional Radiology

## 2013-10-18 ENCOUNTER — Ambulatory Visit: Payer: Medicare Other | Admitting: Family Medicine

## 2013-10-18 DIAGNOSIS — M549 Dorsalgia, unspecified: Secondary | ICD-10-CM

## 2013-10-18 LAB — PROTIME-INR
INR: 0.9 (ref 0.00–1.49)
Prothrombin Time: 12 seconds (ref 11.6–15.2)

## 2013-10-18 MED ORDER — MIDAZOLAM HCL 2 MG/2ML IJ SOLN
INTRAMUSCULAR | Status: AC
  Filled 2013-10-18: qty 4

## 2013-10-18 MED ORDER — ACETAMINOPHEN 325 MG PO TABS
650.0000 mg | ORAL_TABLET | Freq: Three times a day (TID) | ORAL | Status: DC
  Administered 2013-10-18 – 2013-10-19 (×4): 650 mg via ORAL
  Filled 2013-10-18 (×4): qty 2

## 2013-10-18 MED ORDER — CEFAZOLIN SODIUM-DEXTROSE 2-3 GM-% IV SOLR
2.0000 g | Freq: Once | INTRAVENOUS | Status: AC
  Administered 2013-10-18: 2 g via INTRAVENOUS
  Filled 2013-10-18: qty 50

## 2013-10-18 MED ORDER — METHOCARBAMOL 500 MG PO TABS
500.0000 mg | ORAL_TABLET | Freq: Four times a day (QID) | ORAL | Status: DC | PRN
  Administered 2013-10-18: 500 mg via ORAL
  Filled 2013-10-18 (×2): qty 1

## 2013-10-18 MED ORDER — MIDAZOLAM HCL 2 MG/2ML IJ SOLN
INTRAMUSCULAR | Status: AC | PRN
  Administered 2013-10-18: 1 mg via INTRAVENOUS
  Administered 2013-10-18: 0.5 mg via INTRAVENOUS
  Administered 2013-10-18 (×4): 1 mg via INTRAVENOUS

## 2013-10-18 MED ORDER — HYDROCODONE-ACETAMINOPHEN 10-325 MG PO TABS
1.0000 | ORAL_TABLET | Freq: Four times a day (QID) | ORAL | Status: DC | PRN
  Administered 2013-10-18 – 2013-10-19 (×3): 1 via ORAL
  Filled 2013-10-18 (×3): qty 1

## 2013-10-18 MED ORDER — SODIUM CHLORIDE 0.9 % IV SOLN
INTRAVENOUS | Status: AC
  Administered 2013-10-18 (×2): via INTRAVENOUS

## 2013-10-18 MED ORDER — HYDROCODONE-ACETAMINOPHEN 10-325 MG PO TABS
1.0000 | ORAL_TABLET | Freq: Four times a day (QID) | ORAL | Status: DC | PRN
  Administered 2013-10-18: 1 via ORAL
  Filled 2013-10-18: qty 1

## 2013-10-18 MED ORDER — MIDAZOLAM HCL 2 MG/2ML IJ SOLN
INTRAMUSCULAR | Status: AC
  Filled 2013-10-18: qty 2

## 2013-10-18 MED ORDER — HYDROMORPHONE HCL PF 1 MG/ML IJ SOLN
INTRAMUSCULAR | Status: AC
  Filled 2013-10-18: qty 2

## 2013-10-18 MED ORDER — ENOXAPARIN SODIUM 40 MG/0.4ML ~~LOC~~ SOLN
40.0000 mg | SUBCUTANEOUS | Status: DC
  Administered 2013-10-19: 40 mg via SUBCUTANEOUS
  Filled 2013-10-18 (×2): qty 0.4

## 2013-10-18 MED ORDER — GELATIN ABSORBABLE 12-7 MM EX MISC
CUTANEOUS | Status: AC
  Filled 2013-10-18: qty 1

## 2013-10-18 MED ORDER — TOBRAMYCIN SULFATE 1.2 G IJ SOLR
INTRAMUSCULAR | Status: AC
  Filled 2013-10-18: qty 1.2

## 2013-10-18 MED ORDER — TRAMADOL HCL 50 MG PO TABS
100.0000 mg | ORAL_TABLET | Freq: Four times a day (QID) | ORAL | Status: DC | PRN
  Administered 2013-10-18: 100 mg via ORAL
  Filled 2013-10-18: qty 2

## 2013-10-18 MED ORDER — IOHEXOL 300 MG/ML  SOLN
50.0000 mL | Freq: Once | INTRAMUSCULAR | Status: AC | PRN
  Administered 2013-10-18: 5 mL via INTRAVENOUS

## 2013-10-18 MED ORDER — HYDRALAZINE HCL 20 MG/ML IJ SOLN
INTRAMUSCULAR | Status: AC | PRN
  Administered 2013-10-18: 5 mg via INTRAVENOUS

## 2013-10-18 MED ORDER — FENTANYL CITRATE 0.05 MG/ML IJ SOLN
INTRAMUSCULAR | Status: AC | PRN
  Administered 2013-10-18 (×6): 25 ug via INTRAVENOUS

## 2013-10-18 MED ORDER — HYDRALAZINE HCL 20 MG/ML IJ SOLN
INTRAMUSCULAR | Status: AC
  Filled 2013-10-18: qty 1

## 2013-10-18 MED ORDER — FENTANYL CITRATE 0.05 MG/ML IJ SOLN
INTRAMUSCULAR | Status: AC
Start: 2013-10-18 — End: 2013-10-19
  Filled 2013-10-18: qty 4

## 2013-10-18 NOTE — Progress Notes (Signed)
Patient ID: [name], female   DOB: 11-Jun-1941, 73 y.o.   MRN: [id number] Request received for possible VP/KP in pt with hx of osteoporosis, acute on chronic back pain since March 2015 and evidence of multiple acute/subacute thoracic/lumbar compression fractures on recent imaging. Pt denies recent trauma/falls precipitating pain but states majority of pain is located in lumbar region with radiation to both hips. She also has discomfort in mid back as well but not as severe. She has been constipated and seems to hurt in her back no matter what position she is in. She denies any associated radiculopathy to arms or legs or voiding difficulty. Bone scan performed on 4/18 revealed increased uptake at L2/L4 c/w acute fractures. There was subtle uptake at T9/04/11/11 with T9 level being the most acute region of injury. Findings/images were reviewed by Dr. Estanislado Pandy and he feels pt is candidate for L2/4 possibly T9 VP/KP. Additional PMH as below. Exam: pt awake/alert; chest- CTA bilat; heart- RRR; pacer left chest wall; abd- soft,+BS,NT; ext- FROM, no edema. Pt with mod to severe paravertebral tenderness L2/4 regions, mod tenderness along mid to lower thoracic spine, notably T9-10 region.     Filed Vitals:   10/17/13 0530 10/17/13 1453 10/17/13 2114 10/18/13 0518  BP: 130/71 118/72 158/78 165/77  Pulse: 73 80 71 75  Temp: 97.9 F (36.6 C) 98 F (36.7 C) 97.9 F (36.6 C) 98.3 F (36.8 C)  TempSrc: Oral Oral Oral Oral  Resp: 18 18 18 18   Height:      Weight:      SpO2: 95% 96% 95% 92%   Past Medical History  Diagnosis Date  . Atrioventricular block, complete     a. 08/2011 Upgrage of PPM to MDT Adapta L Dual Chamber PPM ser # [id number].  . Diastolic dysfunction   . HTN (hypertension)   . Acute neck pain   . Tension headache   . Multinodular goiter   . Contact dermatitis and eczema     unspec cause  . Tooth abscess   . Thyroid nodule   . Osteoporosis   . GERD (gastroesophageal reflux disease)    . Depression   . Lung nodule   . Superficial thrombophlebitis   . Hypothyroidism   . Acute sinusitis   . Mitral regurgitation     a.03/2012 Echo: EF 65-70%, mod MR  . Chest pain     a. 09/2007 Myoview: EF 77%, no ischemia/infarct.  [name] PONV (postoperative nausea and vomiting)   . Pacemaker   . CHF (congestive heart failure)    Past Surgical History  Procedure Laterality Date  . Laporatomy for ruptured diverticulum with colostomy  2003  . Take down of colostomy  2004  . Exploratory laparotomy    . Medtronic pacemaker  05/2003  . Hernia repair    . Colon surgery    . Thyroidectomy N/A 08/07/2012    Procedure: THYROIDECTOMY;  Surgeon: Earnstine Regal, MD;  Location: [facility];  Service: General;  Laterality: N/A;  . Colonoscopy  01/19/2013    colonoscopy   Dg Abd 1 View  10/15/2013   CLINICAL DATA:  Fecal impaction. Low back pain radiating to the hips.  EXAM: ABDOMEN - 1 VIEW  COMPARISON:  CT ABD/PELVIS W CM dated 10/14/2013  FINDINGS: Blunted left lateral costophrenic angle.  Mild cardiomegaly.  Hernia mesh markers noted. Oral contrast medium is observed in the large bowel and in the urinary bladder, left over from CT scan yesterday, with significant reduction in the  amount of colonic stool. No dilated small bowel observed.  IMPRESSION: 1. Significant interval clearing of stool from the colon. There is oral contrast in the colon left over from yesterday's CT scan. 2. Increased blunting of the left lateral costophrenic angle -a small left pleural effusion is not excluded.   Electronically Signed   By: Sherryl Barters M.D.   On: 10/15/2013 12:50   Nm Bone Scan Whole Body  10/16/2013   CLINICAL DATA:  Lower back pain since early March 2015. Multiple compression fractures on CT. Evaluate for acuity.  EXAM: NUCLEAR MEDICINE WHOLE BODY BONE SCAN  TECHNIQUE: Whole body anterior and posterior images were obtained approximately 3 hours after intravenous injection of radiopharmaceutical.   RADIOPHARMACEUTICALS:  25.0 mCi of Technetium-99 MDP  COMPARISON:  CT scan abdomen/ pelvis 10/14/2013 and 09/03/2013  FINDINGS: Focally increased radiotracer uptake noted in the superior endplate of L2 and L4 consistent with likely acute compression fractures after correlation with prior CT imaging. Additional more subtle increased radiotracer uptake is noted in the T9, T10, T11 and T12 endplates.  Focal radiotracer uptake in the left anterior humerus likely at the injection site. Normal tracer uptake in the kidneys and renal collecting system. Trace degenerative changes in the bilateral knees and shoulders and a thumb MCP joints.  IMPRESSION: 1. Focally increased radiotracer uptake in the superior endplates of L2 and L4 consistent with acute compression fractures. 2. More subtly increased radiotracer uptake in the T9, T10, T11 and T12 endplates is less specific. Suspect subacute compression fractures given the appearance on CT. Of note, no definite T9 fracture was noted on the prior CT scan. This may be the most acute region of injury.   Electronically Signed   By: Jacqulynn Cadet M.D.   On: 10/16/2013 13:07   Ct Abdomen Pelvis W Contrast  10/14/2013   CLINICAL DATA:  Back and abdominal pain with decreased stool frequency  EXAM: CT ABDOMEN AND PELVIS WITH CONTRAST  TECHNIQUE: Multidetector CT imaging of the abdomen and pelvis was performed using the standard protocol following bolus administration of intravenous contrast.  CONTRAST:  122mL OMNIPAQUE IOHEXOL 300 MG/ML SOLN intravenously ; the patient also received oral contrast material  COMPARISON:  CT ABDOMEN W/O CM dated 09/03/2013  FINDINGS: The stomach contains most of the orally administered contrast. Only a small amount has entered the duodenum. There is subjective thickening of the wall of the pylorus and proximal duodenum. The jejunum and ileum contain no contrast but exhibit no acute abnormalities.  The cecum and ascending colon and proximal transverse  colon are quite distended with inspissated stool. There is no evidence of abnormal bowel wall thickening. More distally smaller amounts of stool and gas are present in the descending colon and rectosigmoid. There is no fecal impaction within the rectum.  The liver, pancreas, adrenal glands, and kidneys exhibit no acute abnormalities. There is a tiny calcified stone in the dependent portion of the gallbladder. There are hypodensities within the spleen which are chronic and likely reflect hemangiomas. These become less conspicuous on the delayed images. The caliber of the abdominal aorta is normal. There is no periaortic or pericaval lymphadenopathy. Within the pelvis the partially distended urinary bladder is grossly normal. The uterus is small and exhibits calcifications consistent with fibroids. No adnexal masses are demonstrated. There is no free pelvic fluid. There is no umbilical hernia. The patient has undergone previous ventral hernia repair. Inferior to the repair there is a focal shallow bulge in the peritoneum in the anterior  pelvic wall.  The lumbar spine exhibits degenerative disc change at multiple levels. There is mild endplate depression of L2 with a prominent posterior osteophyte. The bony pelvis exhibits no acute abnormality.  The lung bases exhibit minimal atelectasis in the extreme posterior costophrenic gutters.  IMPRESSION: 1. There is a large amount of stool in the cecum and ascending colon and mid transverse colon. More distally there is less fecal material and more gas. This pattern consistent with severe constipation. There is no evidence of enteritis or colitis or diverticulitis. 2. The small bowel is not abnormally distended. There is subjective thickening of the wall of the pylorus and first portion of the duodenum which may reflect peptic ulcer disease. There is no evidence of ulceration or mass. 3. Is a tiny calcified gallstone. There is no CT evidence of acute cholecystitis. There are  chronic, stable-appearing, low-density lesions within the spleen likely reflecting hemangiomas.   Electronically Signed   By: David  Martinique   On: 10/14/2013 21:51  Results for orders placed during the hospital encounter of 10/14/13  MRSA PCR SCREENING      Result Value Ref Range   MRSA by PCR NEGATIVE  NEGATIVE  CBC WITH DIFFERENTIAL      Result Value Ref Range   WBC 7.0  4.0 - 10.5 K/uL   RBC 4.60  3.87 - 5.11 MIL/uL   Hemoglobin 12.6  12.0 - 15.0 g/dL   HCT 38.0  36.0 - 46.0 %   MCV 82.6  78.0 - 100.0 fL   MCH 27.4  26.0 - 34.0 pg   MCHC 33.2  30.0 - 36.0 g/dL   RDW 13.3  11.5 - 15.5 %   Platelets 173  150 - 400 K/uL   Neutrophils Relative % 70  43 - 77 %   Neutro Abs 4.9  1.7 - 7.7 K/uL   Lymphocytes Relative 18  12 - 46 %   Lymphs Abs 1.3  0.7 - 4.0 K/uL   Monocytes Relative 11  3 - 12 %   Monocytes Absolute 0.8  0.1 - 1.0 K/uL   Eosinophils Relative 1  0 - 5 %   Eosinophils Absolute 0.1  0.0 - 0.7 K/uL   Basophils Relative 0  0 - 1 %   Basophils Absolute 0.0  0.0 - 0.1 K/uL  COMPREHENSIVE METABOLIC PANEL      Result Value Ref Range   Sodium 131 (*) 137 - 147 mEq/L   Potassium 4.2  3.7 - 5.3 mEq/L   Chloride 90 (*) 96 - 112 mEq/L   CO2 22  19 - 32 mEq/L   Glucose, Bld 98  70 - 99 mg/dL   BUN 19  6 - 23 mg/dL   Creatinine, Ser 0.72  0.50 - 1.10 mg/dL   Calcium 10.1  8.4 - 10.5 mg/dL   Total Protein 7.3  6.0 - 8.3 g/dL   Albumin 3.9  3.5 - 5.2 g/dL   AST 26  0 - 37 U/L   ALT 18  0 - 35 U/L   Alkaline Phosphatase 123 (*) 39 - 117 U/L   Total Bilirubin 0.7  0.3 - 1.2 mg/dL   GFR calc non Af Amer 82 (*) >90 mL/min   GFR calc Af Amer >90  >90 mL/min  URINALYSIS, ROUTINE W REFLEX MICROSCOPIC      Result Value Ref Range   Color, Urine YELLOW  YELLOW   APPearance CLEAR  CLEAR   Specific Gravity, Urine 1.024  1.005 -  1.030   pH 6.5  5.0 - 8.0   Glucose, UA NEGATIVE  NEGATIVE mg/dL   Hgb urine dipstick NEGATIVE  NEGATIVE   Bilirubin Urine NEGATIVE  NEGATIVE   Ketones, ur  NEGATIVE  NEGATIVE mg/dL   Protein, ur NEGATIVE  NEGATIVE mg/dL   Urobilinogen, UA 0.2  0.0 - 1.0 mg/dL   Nitrite NEGATIVE  NEGATIVE   Leukocytes, UA NEGATIVE  NEGATIVE  TSH      Result Value Ref Range   TSH 3.580  0.350 - 4.500 uIU/mL  COMPREHENSIVE METABOLIC PANEL      Result Value Ref Range   Sodium 136 (*) 137 - 147 mEq/L   Potassium 4.5  3.7 - 5.3 mEq/L   Chloride 95 (*) 96 - 112 mEq/L   CO2 24  19 - 32 mEq/L   Glucose, Bld 101 (*) 70 - 99 mg/dL   BUN 20  6 - 23 mg/dL   Creatinine, Ser 0.60  0.50 - 1.10 mg/dL   Calcium 10.3  8.4 - 10.5 mg/dL   Total Protein 6.9  6.0 - 8.3 g/dL   Albumin 3.7  3.5 - 5.2 g/dL   AST 30  0 - 37 U/L   ALT 22  0 - 35 U/L   Alkaline Phosphatase 127 (*) 39 - 117 U/L   Total Bilirubin 0.6  0.3 - 1.2 mg/dL   GFR calc non Af Amer 87 (*) >90 mL/min   GFR calc Af Amer >90  >90 mL/min  CBC WITH DIFFERENTIAL      Result Value Ref Range   WBC 5.9  4.0 - 10.5 K/uL   RBC 4.50  3.87 - 5.11 MIL/uL   Hemoglobin 12.3  12.0 - 15.0 g/dL   HCT 37.2  36.0 - 46.0 %   MCV 82.7  78.0 - 100.0 fL   MCH 27.3  26.0 - 34.0 pg   MCHC 33.1  30.0 - 36.0 g/dL   RDW 13.1  11.5 - 15.5 %   Platelets 160  150 - 400 K/uL   Neutrophils Relative % 82 (*) 43 - 77 %   Neutro Abs 4.9  1.7 - 7.7 K/uL   Lymphocytes Relative 10 (*) 12 - 46 %   Lymphs Abs 0.6 (*) 0.7 - 4.0 K/uL   Monocytes Relative 8  3 - 12 %   Monocytes Absolute 0.4  0.1 - 1.0 K/uL   Eosinophils Relative 0  0 - 5 %   Eosinophils Absolute 0.0  0.0 - 0.7 K/uL   Basophils Relative 0  0 - 1 %   Basophils Absolute 0.0  0.0 - 0.1 K/uL  BASIC METABOLIC PANEL      Result Value Ref Range   Sodium 136 (*) 137 - 147 mEq/L   Potassium 4.1  3.7 - 5.3 mEq/L   Chloride 99  96 - 112 mEq/L   CO2 26  19 - 32 mEq/L   Glucose, Bld 104 (*) 70 - 99 mg/dL   BUN 20  6 - 23 mg/dL   Creatinine, Ser 0.75  0.50 - 1.10 mg/dL   Calcium 9.3  8.4 - 10.5 mg/dL   GFR calc non Af Amer 81 (*) >90 mL/min   GFR calc Af Amer >90  >90 mL/min    A/P: Pt with symptomatic/activity limiting acute/subacute thoracic/lumbar osteoporotic compression fractures. Tent plan is for L2/L4, possibly T9 VP/KP this afternoon. Details/risks of procedure d/w pt with her understanding and consent.

## 2013-10-18 NOTE — Progress Notes (Addendum)
PROGRESS NOTE    [name]:[id number] DOB: 15-Aug-1940 DOA: 10/14/2013 PCP: Adella Hare, MD Primary Gastroenterologist: Dr. Ronald Lobo Primary Cardiologist/EPS: Dr. Virl Axe  HPI/Brief narrative 73 year old female with history of hypertension, postsurgical hypothyroidism, diastolic dysfunction, chronic back pain of unclear etiology, admitted on 10/15/13 with worsening abdominal pain over the last 24 hours, no BM for approximately a month despite several home measures. In the ED, CT abdomen and pelvis showed fecal impaction.   Assessment/Plan:  1. Fecal impaction: Unclear etiology. Not on chronic opioids. TSH normal. She states that she has tried several measures including increased intake of fruits, vegetables, fruit juice, prunes, OTC laxatives and suppositories. Patient has had several BMs since admission-now watery. Discussed with primary GI on 4/17-last colonoscopy July 2014 only significant for diverticulosis. Start on bowel regimen-MiraLAX 17 g twice a day and senna 2 tabs twice a day. KUB 4/17 showed improved advance diet to regular consistency diet. Reduce Miralax and stool softeners due to watery stools albeit once daily. Fecal impaction resolved 2. Acute on chronic back pain/vertebral compression fractures: CT abdomen: Lumbar spine shows degenerative disc changes at multiple levels and mild endplate depression of L2 with a prominent posterior osteophyte. After discussing with radiologist on 4/17, requested bone scan. Bone scan shows findings consistent with L2, L4 & T9 acute compression fractures and T10-12 subacute compression fractures- likely Osteoporotic. Discussed with IR, Dr. Estanislado Pandy: plan for L2, L4 and possibly T9 VP/KP this afternoon 3. Postsurgical hypothyroidism: TSH normal. 4. History of complete heart block status post PPM: Stable. 5. Hypertension: Controlled. Continue lisinopril. HCTZ on hold while patient being hydrated. 6. History of diastolic  dysfunction: Stable. 7. Osteoporosis: OP Mx.   Code Status: Full Family Communication: Discussed with daughter at bedside on 4/17. Disposition Plan: Home likely 4/21   Consultants:  IR: Dr. Estanislado Pandy  Procedures:  None  Antibiotics:  None   Subjective: Continues with back pain and Tramadol not helping sufficiently.  Objective: Filed Vitals:   10/17/13 0530 10/17/13 1453 10/17/13 2114 10/18/13 0518  BP: 130/71 118/72 158/78 165/77  Pulse: 73 80 71 75  Temp: 97.9 F (36.6 C) 98 F (36.7 C) 97.9 F (36.6 C) 98.3 F (36.8 C)  TempSrc: Oral Oral Oral Oral  Resp: 18 18 18 18   Height:      Weight:      SpO2: 95% 96% 95% 92%    Intake/Output Summary (Last 24 hours) at 10/18/13 1320 Last data filed at 10/18/13 0934  Gross per 24 hour  Intake    240 ml  Output      0 ml  Net    240 ml   Filed Weights   10/14/13 1619 10/15/13 0631  Weight: 64.411 kg (142 lb) 67.541 kg (148 lb 14.4 oz)     Exam:  General exam: Pleasant elderly female ambulating in room, in mild painful distress Respiratory system: Clear. No increased work of breathing. Cardiovascular system: S1 & S2 heard, RRR. No JVD, murmurs, gallops, clicks. Trace bilateral ankle edema Gastrointestinal system: Abdomen is mildly distended but soft and nontender. Normal bowel sounds heard. Central nervous system: Alert and oriented. No focal neurological deficits. Extremities: Symmetric 5 x 5 power.   Data Reviewed: Basic Metabolic Panel:  Recent Labs Lab 10/14/13 1623 10/15/13 0752 10/16/13 0616  NA 131* 136* 136*  K 4.2 4.5 4.1  CL 90* 95* 99  CO2 22 24 26   GLUCOSE 98 101* 104*  BUN 19 20 20   CREATININE 0.72 0.60 0.75  CALCIUM 10.1 10.3 9.3   Liver Function Tests:  Recent Labs Lab 10/14/13 1623 10/15/13 0752  AST 26 30  ALT 18 22  ALKPHOS 123* 127*  BILITOT 0.7 0.6  PROT 7.3 6.9  ALBUMIN 3.9 3.7   No results found for this basename: LIPASE, AMYLASE,  in the last 168 hours No  results found for this basename: AMMONIA,  in the last 168 hours CBC:  Recent Labs Lab 10/14/13 1623 10/15/13 0752  WBC 7.0 5.9  NEUTROABS 4.9 4.9  HGB 12.6 12.3  HCT 38.0 37.2  MCV 82.6 82.7  PLT 173 160   Cardiac Enzymes: No results found for this basename: CKTOTAL, CKMB, CKMBINDEX, TROPONINI,  in the last 168 hours BNP (last 3 results) No results found for this basename: PROBNP,  in the last 8760 hours CBG: No results found for this basename: GLUCAP,  in the last 168 hours  Recent Results (from the past 240 hour(s))  MRSA PCR SCREENING     Status: None   Collection Time    10/15/13  7:17 AM      Result Value Ref Range Status   MRSA by PCR NEGATIVE  NEGATIVE Final   Comment:            The GeneXpert MRSA Assay (FDA     approved for NASAL specimens     only), is one component of a     comprehensive MRSA colonization     surveillance program. It is not     intended to diagnose MRSA     infection nor to guide or     monitor treatment for     MRSA infections.      Additional labs: 1. TSH: 3.580     Studies: No results found.      Scheduled Meds: . acetaminophen  650 mg Oral TID  .  ceFAZolin (ANCEF) IV  2 g Intravenous Once  . enoxaparin (LOVENOX) injection  40 mg Subcutaneous Q24H  . gabapentin  300 mg Oral TID  . levothyroxine  88 mcg Oral QAC breakfast  . lisinopril  20 mg Oral Daily  . pantoprazole  40 mg Oral Daily  . polyethylene glycol  17 g Oral BID  . senna  2 tablet Oral BID   Continuous Infusions:    Principal Problem:   Fecal impaction Active Problems:   HYPERTENSION   DIASTOLIC DYSFUNCTION   Pacemaker-Medtronic   Hypothyroidism, postsurgical   Constipation   Abdominal pain   PUD (peptic ulcer disease)   Low back pain   Vertebral compression fracture    Time spent: 20 minutes    Modena Jansky, MD, FACP, [facility]. [facility] Pager [phone]  If 7PM-7AM, please contact night-coverage [email or web address] Password  [email or web address] 10/18/2013, 1:20 PM    LOS: 4 days

## 2013-10-18 NOTE — Progress Notes (Signed)
Called [employer], no pre-cert is required for kyphoplasty procedure. JMichaux

## 2013-10-18 NOTE — Procedures (Signed)
S/P T9 and L4 VP and L2 KP

## 2013-10-18 NOTE — Progress Notes (Signed)
Paged Radiologist PA Rowe Robert paged twice to update on patients muscle spasms.  No return phone call received as of yet.  Dr. Algis Liming informed of patients muscle spasms, order requested for muscle relaxant from Dr. Algis Liming.  Order received.  Will continue to monitor patient and await Radiology return phone call.  Dirk Dress 10/18/2013

## 2013-10-19 MED ORDER — SENNA 8.6 MG PO TABS: 2.0000 | ORAL_TABLET | Freq: Two times a day (BID) | ORAL | Status: DC

## 2013-10-19 MED ORDER — POLYETHYLENE GLYCOL 3350 17 G PO PACK: 17.0000 g | PACK | Freq: Two times a day (BID) | ORAL | Status: DC

## 2013-10-19 MED ORDER — ALUM & MAG HYDROXIDE-SIMETH 200-200-20 MG/5ML PO SUSP
30.0000 mL | Freq: Four times a day (QID) | ORAL | Status: DC | PRN
  Administered 2013-10-19: 30 mL via ORAL
  Filled 2013-10-19: qty 30

## 2013-10-19 MED ORDER — HYDROCODONE-ACETAMINOPHEN 10-325 MG PO TABS: 1.0000 | ORAL_TABLET | Freq: Four times a day (QID) | ORAL | Status: DC | PRN

## 2013-10-19 NOTE — Progress Notes (Signed)
  Subjective: T9/ L4 vertebroplasty L2 Kyphoplasty Performed 4/20 in IR Pt has done well Some relief of pain  Objective: Vital signs in last 24 hours: Temp:  [97.9 F (36.6 C)-98.2 F (36.8 C)] 98.1 F (36.7 C) (04/21 0455) Pulse Rate:  [70-83] 83 (04/21 0455) Resp:  [8-30] 18 (04/21 0455) BP: (144-200)/(75-112) 144/82 mmHg (04/21 0455) SpO2:  [92 %-100 %] 92 % (04/21 0455) Last BM Date: 10/16/13  Intake/Output from previous day: 04/20 0701 - 04/21 0700 In: 555 [P.O.:440; I.V.:115] Out: -  Intake/Output this shift:    PE:  afeb VSS Sites are sl tender; no bleeding No hematoma Moves all 4s Able to roll and sleep on side today---this is better   Lab Results:  No results found for this basename: WBC, HGB, HCT, PLT,  in the last 72 hours BMET No results found for this basename: NA, K, CL, CO2, GLUCOSE, BUN, CREATININE, CALCIUM,  in the last 72 hours PT/INR  Recent Labs  10/18/13 1137  LABPROT 12.0  INR 0.90   ABG No results found for this basename: PHART, PCO2, PO2, HCO3,  in the last 72 hours  Studies/Results: No results found.  Anti-infectives: Anti-infectives   Start     Dose/Rate Route Frequency Ordered Stop   10/18/13 1502  tobramycin (NEBCIN) 1.2 G powder    Comments:  Nelly Laurence   : cabinet override      10/18/13 1502 10/19/13 0314   10/18/13 1000  ceFAZolin (ANCEF) IVPB 2 g/50 mL premix     2 g 100 mL/hr over 30 Minutes Intravenous  Once 10/18/13 0935 10/18/13 1544      Assessment/Plan: s/p * No surgery found *   T9/L4 VP L2 KP-- 4/20 Pt has done well; some relief but still painful Reassurance Discussed with pt maximum benefit from this procedure is a 2 week time frame most times Follow up with Dr Estanislado Pandy in  2weeks; scheduler will call pt   LOS: 5 days    Lavonia Drafts 10/19/2013

## 2013-10-19 NOTE — Discharge Summary (Signed)
Physician Discharge Summary  [name] ZOX:[id number] DOB: January 19, 1941 DOA: 10/14/2013  PCP: Loreen Freud, DO  Admit date: 10/14/2013 Discharge date: 10/19/2013  Time spent: Less than 30 minutes  Recommendations for Outpatient Follow-up:  1. Dr. Loreen Freud, PCP (new) in 3 days. 2. Dr. Julieanne Cotton, IR in 2 weeks: MDs office will call with followup appointment.  Discharge Diagnoses:  Principal Problem:   Fecal impaction Active Problems:   HYPERTENSION   DIASTOLIC DYSFUNCTION   Pacemaker-[employer]   Hypothyroidism, postsurgical   Constipation   Abdominal pain   PUD (peptic ulcer disease)   Low back pain   Vertebral compression fracture   Discharge Condition: Improved & Stable  Diet recommendation: Heart healthy diet.  Filed Weights   10/14/13 1619 10/15/13 0631  Weight: 64.411 kg (142 lb) 67.541 kg (148 lb 14.4 oz)    History of present illness:  73 year old female with history of hypertension, postsurgical hypothyroidism, diastolic dysfunction, chronic back pain of unclear etiology, admitted on 10/15/13 with worsening abdominal pain over the last 24 hours, no BM for approximately a month despite several home measures. In the ED, CT abdomen and pelvis showed fecal impaction.  Hospital Course:   1. Fecal impaction: Unclear etiology. Not on chronic opioids. TSH normal. She states that she had tried several measures including increased intake of fruits, vegetables, fruit juice, prunes, OTC laxatives and suppositories. She was treated with enemas, MiraLAX and stool softeners. Patient has had several BMs since admission. Discussed with primary GI on 4/17-last colonoscopy July 2014 only significant for diverticulosis. Start on bowel regimen-MiraLAX 17 g twice a day and senna 2 tabs twice a day. KUB 4/17 showed improvement. Patient did not have BM 48 hours prior to discharge but was passing flatus. Fecal impaction resolved. On CT scan, there was subjective thickening of the  wall of the pylorus and first part of the duodenum in the absence of upper GI symptoms Advised outpatient followup with primary gastroenterologist. 2. Acute on chronic back pain/vertebral compression fractures: Patient complained of severe and at times disabling back pain over the course of last 1 month. Percocet apparently caused hallucinations. CT abdomen: Lumbar spine shows degenerative disc changes at multiple levels and mild endplate depression of L2 with a prominent posterior osteophyte. After discussing with radiologist on 4/17, requested bone scan (unable to do MRI secondary to pacemaker). Bone scan shows findings consistent with L2, L4 & T9 acute compression fractures and T10-12 subacute compression fractures- likely Osteoporotic. Interventional radiology was consulted and patient underwent T9/L4 vertebroplasty and L2 kyphoplasty on 10/18/13. Interventional radiology saw patient on day of discharge and advised that maximum benefit from the procedure will may take up to 2 weeks time. They will arrange for outpatient followup. 3. Postsurgical hypothyroidism: TSH normal. 4. History of complete heart block status post PPM: Stable. 5. Hypertension: Controlled. Continue home medications at discharge. 6. History of diastolic dysfunction: Stable. 7. Osteoporosis: OP Mx.   Consultations:  Interventional radiology  Procedures:  T9/L4 vertebroplasty and L2 kyphoplasty on 10/18/13   Discharge Exam:  Complaints:  Back pain controlled. No BM for 2 days. Passing flatus.  Filed Vitals:   10/18/13 2203 10/19/13 0455 10/19/13 1027 10/19/13 1421  BP: 147/75 144/82 141/73 154/93  Pulse: 76 83  96  Temp: 98.2 F (36.8 C) 98.1 F (36.7 C)  98.1 F (36.7 C)  TempSrc: Oral Oral  Oral  Resp: Height:      Weight:      SpO2: 93%  92%  95%    General exam: Pleasant elderly female ambulating in room, in mild painful distress  Respiratory system: Clear. No increased work of breathing.   Cardiovascular system: S1 & S2 heard, RRR. No JVD, murmurs, gallops, clicks. Trace bilateral ankle edema  Gastrointestinal system: Abdomen is mildly distended but soft and nontender. Normal bowel sounds heard.  Central nervous system: Alert and oriented. No focal neurological deficits.  Extremities: Symmetric 5 x 5 power.   Discharge Instructions      Discharge Orders   Future Appointments Provider Department Dept Phone   11/02/2013 11:00 AM S[place], DO [facility] [phone]   12/03/2013 1:30 PM Rosalita Chessman, [place] at  [place]   Future Orders Complete By Expires   IR Radiologist Eval & Mgmt  11/18/2013 12/20/2014   Questions:     Preferred Imaging Location?:  [facility]   Reason for Exam (SYMPTOM  OR DIAGNOSIS REQUIRED):  follow up 3 weeks post VP/KP 4/20- with Dr Estanislado Pandy   Call MD for:  severe uncontrolled pain  As directed    Call MD for:  temperature >100.4  As directed    Diet - low sodium heart healthy  As directed    Increase activity slowly  As directed        Medication List         calcium carbonate 1250 MG tablet  Commonly known as:  OS-CAL - dosed in mg of elemental calcium  Take 500 mg by mouth 3 (three) times daily.     CORICIDIN 2-325 MG Tabs  Generic drug:  Chlorpheniramine-APAP  Take 2 tablets by mouth daily as needed. For allergies     diphenhydramine-acetaminophen 25-500 MG Tabs  Commonly known as:  TYLENOL PM  Take 1 tablet by mouth at bedtime as needed (sleep).     gabapentin 300 MG capsule  Commonly known as:  NEURONTIN  Take 300 mg by mouth 3 (three) times daily.     HYDROcodone-acetaminophen 10-325 MG per tablet  Commonly known as:  NORCO  Take 1 tablet by mouth every 6 (six) hours as needed for moderate pain or severe pain.     levothyroxine 88 MCG tablet  Commonly known as:  SYNTHROID, LEVOTHROID  Take 1 tablet (88 mcg total) by mouth daily before breakfast.      lisinopril-hydrochlorothiazide 20-12.5 MG per tablet  Commonly known as:  PRINZIDE,ZESTORETIC  Take 1 tablet by mouth daily.     meloxicam 15 MG tablet  Commonly known as:  MOBIC  Take 15 mg by mouth daily.     multivitamin capsule  Take 1 capsule by mouth daily.     omeprazole 20 MG capsule  Commonly known as:  PRILOSEC  Take 20 mg by mouth daily as needed. Heart burn     polyethylene glycol packet  Commonly known as:  MIRALAX / GLYCOLAX  Take 17 g by mouth 2 (two) times daily.     PROLIA 60 MG/ML Soln injection  Generic drug:  denosumab  - Inject 60 mg into the skin every 6 (six) months. One injection every 6 months  - Last dose was in October, Next dose due in April     senna 8.6 MG Tabs tablet  Commonly known as:  SENOKOT  Take 2 tablets (17.2 mg total) by mouth 2 (two) times daily.       Follow-up Information   Follow up with Garnet Koyanagi, DO. Schedule an appointment as soon  as possible for a visit in 3 days.   Specialty:  Family Medicine   Contact information:   [phone] [place] [address] [phone]       Follow up with Rob Hickman, MD In 2 weeks. (MD's office will call with appointment.)    Specialty:  Interventional Radiology   Contact information:   [address] [place] [address] [phone]        The results of significant diagnostics from this hospitalization (including imaging, microbiology, ancillary and laboratory) are listed below for reference.    Significant Diagnostic Studies: Dg Abd 1 View  10/15/2013   CLINICAL DATA:  Fecal impaction. Low back pain radiating to the hips.  EXAM: ABDOMEN - 1 VIEW  COMPARISON:  CT ABD/PELVIS W CM dated 10/14/2013  FINDINGS: Blunted left lateral costophrenic angle.  Mild cardiomegaly.  Hernia mesh markers noted. Oral contrast medium is observed in the large bowel and in the urinary bladder, left over from CT scan yesterday, with significant reduction in the amount of  colonic stool. No dilated small bowel observed.  IMPRESSION: 1. Significant interval clearing of stool from the colon. There is oral contrast in the colon left over from yesterday's CT scan. 2. Increased blunting of the left lateral costophrenic angle -a small left pleural effusion is not excluded.   Electronically Signed   By: Sherryl Barters M.D.   On: 10/15/2013 12:50   Nm Bone Scan Whole Body  10/16/2013   CLINICAL DATA:  Lower back pain since early March 2015. Multiple compression fractures on CT. Evaluate for acuity.  EXAM: NUCLEAR MEDICINE WHOLE BODY BONE SCAN  TECHNIQUE: Whole body anterior and posterior images were obtained approximately 3 hours after intravenous injection of radiopharmaceutical.  RADIOPHARMACEUTICALS:  25.0 mCi of Technetium-99 MDP  COMPARISON:  CT scan abdomen/ pelvis 10/14/2013 and 09/03/2013  FINDINGS: Focally increased radiotracer uptake noted in the superior endplate of L2 and L4 consistent with likely acute compression fractures after correlation with prior CT imaging. Additional more subtle increased radiotracer uptake is noted in the T9, T10, T11 and T12 endplates.  Focal radiotracer uptake in the left anterior humerus likely at the injection site. Normal tracer uptake in the kidneys and renal collecting system. Trace degenerative changes in the bilateral knees and shoulders and a thumb MCP joints.  IMPRESSION: 1. Focally increased radiotracer uptake in the superior endplates of L2 and L4 consistent with acute compression fractures. 2. More subtly increased radiotracer uptake in the T9, T10, T11 and T12 endplates is less specific. Suspect subacute compression fractures given the appearance on CT. Of note, no definite T9 fracture was noted on the prior CT scan. This may be the most acute region of injury.   Electronically Signed   By: Jacqulynn Cadet M.D.   On: 10/16/2013 13:07   Ct Abdomen Pelvis W Contrast  10/14/2013   CLINICAL DATA:  Back and abdominal pain with  decreased stool frequency  EXAM: CT ABDOMEN AND PELVIS WITH CONTRAST  TECHNIQUE: Multidetector CT imaging of the abdomen and pelvis was performed using the standard protocol following bolus administration of intravenous contrast.  CONTRAST:  138mL OMNIPAQUE IOHEXOL 300 MG/ML SOLN intravenously ; the patient also received oral contrast material  COMPARISON:  CT ABDOMEN W/O CM dated 09/03/2013  FINDINGS: The stomach contains most of the orally administered contrast. Only a small amount has entered the duodenum. There is subjective thickening of the wall of the pylorus and proximal duodenum. The jejunum and ileum  contain no contrast but exhibit no acute abnormalities.  The cecum and ascending colon and proximal transverse colon are quite distended with inspissated stool. There is no evidence of abnormal bowel wall thickening. More distally smaller amounts of stool and gas are present in the descending colon and rectosigmoid. There is no fecal impaction within the rectum.  The liver, pancreas, adrenal glands, and kidneys exhibit no acute abnormalities. There is a tiny calcified stone in the dependent portion of the gallbladder. There are hypodensities within the spleen which are chronic and likely reflect hemangiomas. These become less conspicuous on the delayed images. The caliber of the abdominal aorta is normal. There is no periaortic or pericaval lymphadenopathy. Within the pelvis the partially distended urinary bladder is grossly normal. The uterus is small and exhibits calcifications consistent with fibroids. No adnexal masses are demonstrated. There is no free pelvic fluid. There is no umbilical hernia. The patient has undergone previous ventral hernia repair. Inferior to the repair there is a focal shallow bulge in the peritoneum in the anterior pelvic wall.  The lumbar spine exhibits degenerative disc change at multiple levels. There is mild endplate depression of L2 with a prominent posterior osteophyte. The  bony pelvis exhibits no acute abnormality.  The lung bases exhibit minimal atelectasis in the extreme posterior costophrenic gutters.  IMPRESSION: 1. There is a large amount of stool in the cecum and ascending colon and mid transverse colon. More distally there is less fecal material and more gas. This pattern consistent with severe constipation. There is no evidence of enteritis or colitis or diverticulitis. 2. The small bowel is not abnormally distended. There is subjective thickening of the wall of the pylorus and first portion of the duodenum which may reflect peptic ulcer disease. There is no evidence of ulceration or mass. 3. Is a tiny calcified gallstone. There is no CT evidence of acute cholecystitis. 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   Electronically Signed   By: David  Martinique   On: 10/14/2013 21:51    Microbiology: Recent Results (from the past 240 hour(s))  MRSA PCR SCREENING     Status: None   Collection Time    10/15/13  7:17 AM      Result Value Ref Range Status   MRSA by PCR NEGATIVE  NEGATIVE Final   Comment:            The GeneXpert MRSA Assay (FDA     approved for NASAL specimens     only), is one component of a     comprehensive MRSA colonization     surveillance program. It is not     intended to diagnose MRSA     infection nor to guide or     monitor treatment for     MRSA infections.     Labs: Basic Metabolic Panel:  Recent Labs Lab 10/14/13 1623 10/15/13 0752 10/16/13 0616  NA 131* 136* 136*  K 4.2 4.5 4.1  CL 90* 95* 99  CO2 22 24 26   GLUCOSE 98 101* 104*  BUN 19 20 20   CREATININE 0.72 0.60 0.75  CALCIUM 10.1 10.3 9.3   Liver Function Tests:  Recent Labs Lab 10/14/13 1623 10/15/13 0752  AST 26 30  ALT 18 22  ALKPHOS 123* 127*  BILITOT 0.7 0.6  PROT 7.3 6.9  ALBUMIN 3.9 3.7   No results found for this basename: LIPASE, AMYLASE,  in the last 168 hours No results found for  this basename: AMMONIA,  in the last 168 hours CBC:  Recent Labs Lab 10/14/13 1623 10/15/13 0752  WBC 7.0 5.9  NEUTROABS 4.9 4.9  HGB 12.6 12.3  HCT 38.0 37.2  MCV 82.6 82.7  PLT 173 160   Cardiac Enzymes: No results found for this basename: CKTOTAL, CKMB, CKMBINDEX, TROPONINI,  in the last 168 hours BNP: BNP (last 3 results) No results found for this basename: PROBNP,  in the last 8760 hours CBG: No results found for this basename: GLUCAP,  in the last 168 hours  Additional labs: 1. TSH: 3.580   Signed:  Modena Jansky, MD, FACP, [facility]. [facility] Pager [phone]  If 7PM-7AM, please contact night-coverage [email or web address] Password [facility] 10/19/2013, 3:40 PM

## 2013-10-19 NOTE — Progress Notes (Signed)
Nsg Discharge Note  Admit Date:  10/14/2013 Discharge date: 10/19/2013   [name] to be D/C'd Home per MD order.  AVS completed.  Copy for chart, and copy for patient signed, and dated. Patient/caregiver able to verbalize understanding.  Discharge Medication:   Medication List         calcium carbonate 1250 MG tablet  Commonly known as:  OS-CAL - dosed in mg of elemental calcium  Take 500 mg by mouth 3 (three) times daily.     CORICIDIN 2-325 MG Tabs  Generic drug:  Chlorpheniramine-APAP  Take 2 tablets by mouth daily as needed. For allergies     diphenhydramine-acetaminophen 25-500 MG Tabs  Commonly known as:  TYLENOL PM  Take 1 tablet by mouth at bedtime as needed (sleep).     gabapentin 300 MG capsule  Commonly known as:  NEURONTIN  Take 300 mg by mouth 3 (three) times daily.     HYDROcodone-acetaminophen 10-325 MG per tablet  Commonly known as:  NORCO  Take 1 tablet by mouth every 6 (six) hours as needed for moderate pain or severe pain.     levothyroxine 88 MCG tablet  Commonly known as:  SYNTHROID, LEVOTHROID  Take 1 tablet (88 mcg total) by mouth daily before breakfast.     lisinopril-hydrochlorothiazide 20-12.5 MG per tablet  Commonly known as:  PRINZIDE,ZESTORETIC  Take 1 tablet by mouth daily.     meloxicam 15 MG tablet  Commonly known as:  MOBIC  Take 15 mg by mouth daily.     multivitamin capsule  Take 1 capsule by mouth daily.     omeprazole 20 MG capsule  Commonly known as:  PRILOSEC  Take 20 mg by mouth daily as needed. Heart burn     polyethylene glycol packet  Commonly known as:  MIRALAX / GLYCOLAX  Take 17 g by mouth 2 (two) times daily.     PROLIA 60 MG/ML Soln injection  Generic drug:  denosumab  - Inject 60 mg into the skin every 6 (six) months. One injection every 6 months  - Last dose was in October, Next dose due in April     senna 8.6 MG Tabs tablet  Commonly known as:  SENOKOT  Take 2 tablets (17.2 mg total) by mouth 2  (two) times daily.        Discharge Assessment: Filed Vitals:   10/19/13 1421  BP: 154/93  Pulse: 96  Temp: 98.1 F (36.7 C)  Resp: 18   Skin clean, dry and intact without evidence of skin break down, no evidence of skin tears noted. IV catheter discontinued intact. Site without signs and symptoms of complications - no redness or edema noted at insertion site, patient denies c/o pain - only slight tenderness at site.  Dressing with slight pressure applied.  D/c Instructions-Education: Discharge instructions given to patient/family with verbalized understanding. D/c education completed with patient/family including follow up instructions, medication list, d/c activities limitations if indicated, with other d/c instructions as indicated by MD - patient able to verbalize understanding, all questions fully answered. Patient instructed to return to ED, call 911, or call MD for any changes in condition.  Patient escorted via [place], and D/C home via private auto.  Dayle Points, RN 10/19/2013 4:28 PM

## 2013-10-20 ENCOUNTER — Telehealth: Payer: Self-pay | Admitting: Family Medicine

## 2013-10-20 NOTE — Telephone Encounter (Signed)
Patient called and stated that she was seen at [facility] for back pain on 10/18/2013 and was discharged on 10/19/2013. Patient would like to schedule a hospital follow up.  Please advise.

## 2013-10-21 NOTE — Telephone Encounter (Signed)
Pt was seen in ED and admitted for severe constipation/fecal impaction. Patient was started on bowel regime of Miralax BID and Senna 2 tabs BID, she was able to produce a large BM the next morning. Patient was d/c'd home. Okay to schedule in the first 11:30 slot available whne then call back.

## 2013-10-22 ENCOUNTER — Telehealth: Payer: Self-pay | Admitting: Family Medicine

## 2013-10-22 ENCOUNTER — Telehealth: Payer: Self-pay | Admitting: Radiology

## 2013-10-22 NOTE — Telephone Encounter (Signed)
Patient Information:  Caller Name: Andria Frames  Phone: [phone]  Patient: Josie Dixon  Gender: Female  DOB: Nov 30, 1940  Age: 73 Years  PCP: Rosalita Chessman.  Office Follow Up:  Does the office need to follow up with this patient?: No  Instructions For The Office: N/A  RN Note:  Alternate number [phone]  Symptoms  Reason For Call & Symptoms: Daughter calling. States she was discharged home from hospital on 10/20/13.  She contacted the provider who did her surgery and was advised to follow up with PCP.  Caller relates she has hearing loss, pain in head and neck. She has added some medications since the surgery (caller is poor historian).  Headache reported as worst symptom and rated at 6 of 10.  Caller reports Loss of speech or garbled speech which triggers Call EMS 911 Now per Headache guideline.  Caller agreed.  Reviewed Health History In EMR: Yes  Reviewed Medications In EMR: Yes  Reviewed Allergies In EMR: Yes  Reviewed Surgeries / Procedures: Yes  Date of Onset of Symptoms: 10/22/2013  Guideline(s) Used:  Hearing Loss  Headache  Disposition Per Guideline:   Call EMS 911 Now  Reason For Disposition Reached:   Loss of speech or garbled speech and new onset  Advice Given:  Call Back If:  You become worse.  Patient Will Follow Care Advice:  YES

## 2013-10-22 NOTE — Progress Notes (Signed)
The patient's daughter called today with concern about her mother's new symptoms after her vertebroplasty/kyphoplasty on 10/18/13. She complains of change in hearing and states she feels like everything sounds far away, she denies any ringing in her ears. She also complains of frontal headache that radiates back to her neck and this started yesterday. She denies any loss of consciousness, extremity weakness, speech changes or facial weakness. She denies any back pain. I have discussed this with Dr. Estanislado Pandy today and he does not feel her symptoms correlate with her recent procedure and that she should contact her primary care doctor and if altered mental status changes occur or worsening symptoms then present to ED. I have called the daughter back who understands the above plan and will call her mother's PCP.  Tsosie Billing PA-C Interventional Radiology  10/22/2013 3:13 PM

## 2013-10-26 ENCOUNTER — Ambulatory Visit (INDEPENDENT_AMBULATORY_CARE_PROVIDER_SITE_OTHER): Payer: Medicare Other | Admitting: Family Medicine

## 2013-10-26 ENCOUNTER — Encounter: Payer: Self-pay | Admitting: Family Medicine

## 2013-10-26 VITALS — BP 142/88 | HR 70 | Temp 97.4°F | Ht 65.5 in | Wt 144.4 lb

## 2013-10-26 DIAGNOSIS — M549 Dorsalgia, unspecified: Secondary | ICD-10-CM

## 2013-10-26 DIAGNOSIS — S3282XA Multiple fractures of pelvis without disruption of pelvic ring, initial encounter for closed fracture: Secondary | ICD-10-CM

## 2013-10-26 MED ORDER — HYDROCODONE-ACETAMINOPHEN 10-325 MG PO TABS: 1.0000 | ORAL_TABLET | Freq: Four times a day (QID) | ORAL | Status: DC | PRN

## 2013-10-26 MED ORDER — TIZANIDINE HCL 4 MG PO TABS: 4.0000 mg | ORAL_TABLET | Freq: Four times a day (QID) | ORAL | Status: DC | PRN

## 2013-10-26 NOTE — Progress Notes (Signed)
Pre visit review using our clinic review tool, if applicable. No additional management support is needed unless otherwise documented below in the visit note. 

## 2013-10-26 NOTE — Progress Notes (Signed)
   Subjective:    Patient ID: [name], female    DOB: 06-04-1941, 73 y.o.   MRN: [id number]  HPI Pt here f/u ER / hosp for mult compression fractures.  Pt had kyphoplasty on few levels but 3 more need to be done.  Post op visit in 2 weeks and then f/u will be scheduled.   Review of Systems As above    Objective:   Physical Exam BP 142/88  Pulse 70  Temp(Src) 97.4 F (36.3 C) (Oral)  Ht 5' 5.5" (1.664 m)  Wt 144 lb 6.4 oz (65.499 kg)  BMI 23.66 kg/m2  SpO2 99% General appearance: -- sitting in chair in exam room with significant pain Neck: no adenopathy, supple, symmetrical, trachea midline and thyroid not enlarged, symmetric, no tenderness/mass/nodules Lungs: clear to auscultation bilaterally Heart: regular rate and rhythm, S1, S2 normal, no murmur, click, rub or gallop Extremities: extremities normal, atraumatic, no cyanosis or edema       Assessment & Plan:  1. Multiple closed anterior-posterior compression fractures F/u Dr deveshwar interventional rad for kyphplasty   2. Back pain See #1 - HYDROcodone-acetaminophen (NORCO) 10-325 MG per tablet; Take 1 tablet by mouth every 6 (six) hours as needed for moderate pain or severe pain.  Dispense: 60 tablet; Refill: 0 - tiZANidine (ZANAFLEX) 4 MG tablet; Take 1 tablet (4 mg total) by mouth every 6 (six) hours as needed for muscle spasms.  Dispense: 30 tablet; Refill: 0 - Ambulatory referral to [place]

## 2013-10-26 NOTE — Patient Instructions (Signed)
Back, Compression Fracture °A compression fracture happens when a force is put upon the length of your spine. Slipping and falling on your bottom are examples of such a force. When this happens, sometimes the force is great enough to compress the building blocks (vertebral bodies) of your spine. Although this causes a lot of pain, this can usually be treated at home, unless your caregiver feels hospitalization is needed for pain control. °Your backbone (spinal column) is made up of 24 main vertebral bodies in addition to the sacrum and coccyx (see illustration). These are held together by tough fibrous tissues (ligaments) and by support of your muscles. Nerve roots pass through the openings between the vertebrae. A sudden wrenching move, injury, or a fall may cause a compression fracture of one of the vertebral bodies. This may result in back pain or spread of pain into the belly (abdomen), the buttocks, and down the leg into the foot. Pain may also be created by muscle spasm alone. °Large studies have been undertaken to determine the best possible course of action to help your back following injury and also to prevent future problems. The recommendations are as follows. °FOLLOWING A COMPRESSION FRACTURE: °Do the following only if advised by your caregiver.  °· If a back brace has been suggested or provided, wear it as directed. °· DO NOT stop wearing the back brace unless instructed by your caregiver. °· When allowed to return to regular activities, avoid a sedentary life style. Actively exercise. Sporadic weekend binges of tennis, racquetball, water skiing, may actually aggravate or create problems, especially if you are not in condition for that activity. °· Avoid sports requiring sudden body movements until you are in condition for them. Swimming and walking are safer activities. °· Maintain good posture. °· Avoid obesity. °· If not already done, you should have a DEXA scan. Based on the results, be treated for  osteoporosis. °FOLLOWING ACUTE (SUDDEN) INJURY: °· Only take over-the-counter or prescription medicines for pain, discomfort, or fever as directed by your caregiver. °· Use bed rest for only the most extreme acute episode. Prolonged bed rest may aggravate your condition. Ice used for acute conditions is effective. Use a large plastic bag filled with ice. Wrap it in a towel. This also provides excellent pain relief. This may be continuous. Or use it for 30 minutes every 2 hours during acute phase, then as needed. Heat for 30 minutes prior to activities is helpful. °· As soon as the acute phase (the time when your back is too painful for you to do normal activities) is over, it is important to resume normal activities and work hardening programs. Back injuries can cause potentially marked changes in lifestyle. So it is important to attack these problems aggressively. °· See your caregiver for continued problems. He or she can help or refer you for appropriate exercises, physical therapy and work hardening if needed. °· If you are given narcotic medications for your condition, for the next 24 hours DO NOT: °· Drive °· Operate machinery or power tools. °· Sign legal documents. °· DO NOT drink alcohol, take sleeping pills or other medications that may interfere with treatment. °If your caregiver has given you a follow-up appointment, it is very important to keep that appointment. Not keeping the appointment could result in a chronic or permanent injury, pain, and disability. If there is any problem keeping the appointment, you must call back to this facility for assistance.  °SEEK IMMEDIATE MEDICAL CARE IF: °· You develop numbness,   tingling, weakness, or problems with the use of your arms or legs. °· You develop severe back pain not relieved with medications. °· You have changes in bowel or bladder control. °· You have increasing pain in any areas of the body. °Document Released: 06/17/2005 Document Revised: 09/09/2011  Document Reviewed: 01/20/2008 °ExitCare® Patient Information ©2014 [employer]. ° °

## 2013-10-29 ENCOUNTER — Telehealth: Payer: Self-pay | Admitting: Family Medicine

## 2013-10-29 NOTE — Telephone Encounter (Signed)
Caller name: Dasani  Relation to pt: Call back number:[phone]   Reason for call:  Pt states that she has received calls from multiple home health companies and she is confused and does not know who to choose.  Pt states that it was explained that only one company would call her.

## 2013-10-29 NOTE — Telephone Encounter (Signed)
Spoke with daughter and advised referral was faxed to [facility] and I will canceled with [facility]. She wanted to be sure [facility] had her contact information and I sent a staff message to Darlina Guys at [facility]) [phone]

## 2013-11-01 ENCOUNTER — Ambulatory Visit (HOSPITAL_COMMUNITY)
Admission: RE | Admit: 2013-11-01 | Discharge: 2013-11-01 | Disposition: A | Discharge status: Home / Self Care | Payer: Medicare Other | Source: Ambulatory Visit | Attending: Radiology | Admitting: Radiology

## 2013-11-01 DIAGNOSIS — K219 Gastro-esophageal reflux disease without esophagitis: Secondary | ICD-10-CM

## 2013-11-01 DIAGNOSIS — I10 Essential (primary) hypertension: Secondary | ICD-10-CM

## 2013-11-01 DIAGNOSIS — E042 Nontoxic multinodular goiter: Secondary | ICD-10-CM

## 2013-11-01 DIAGNOSIS — E890 Postprocedural hypothyroidism: Secondary | ICD-10-CM

## 2013-11-01 DIAGNOSIS — J984 Other disorders of lung: Secondary | ICD-10-CM

## 2013-11-01 DIAGNOSIS — I8000 Phlebitis and thrombophlebitis of superficial vessels of unspecified lower extremity: Secondary | ICD-10-CM

## 2013-11-01 DIAGNOSIS — K279 Peptic ulcer, site unspecified, unspecified as acute or chronic, without hemorrhage or perforation: Secondary | ICD-10-CM

## 2013-11-01 DIAGNOSIS — I442 Atrioventricular block, complete: Secondary | ICD-10-CM

## 2013-11-01 DIAGNOSIS — M549 Dorsalgia, unspecified: Secondary | ICD-10-CM

## 2013-11-01 DIAGNOSIS — Z862 Personal history of diseases of the blood and blood-forming organs and certain disorders involving the immune mechanism: Secondary | ICD-10-CM

## 2013-11-01 DIAGNOSIS — M545 Low back pain, unspecified: Secondary | ICD-10-CM

## 2013-11-01 DIAGNOSIS — F329 Major depressive disorder, single episode, unspecified: Secondary | ICD-10-CM

## 2013-11-01 DIAGNOSIS — D440 Neoplasm of uncertain behavior of thyroid gland: Secondary | ICD-10-CM

## 2013-11-01 DIAGNOSIS — M810 Age-related osteoporosis without current pathological fracture: Secondary | ICD-10-CM

## 2013-11-01 DIAGNOSIS — D179 Benign lipomatous neoplasm, unspecified: Secondary | ICD-10-CM

## 2013-11-01 DIAGNOSIS — M4850XA Collapsed vertebra, not elsewhere classified, site unspecified, initial encounter for fracture: Secondary | ICD-10-CM

## 2013-11-01 DIAGNOSIS — R109 Unspecified abdominal pain: Secondary | ICD-10-CM

## 2013-11-01 DIAGNOSIS — F3289 Other specified depressive episodes: Secondary | ICD-10-CM

## 2013-11-01 DIAGNOSIS — Z8639 Personal history of other endocrine, nutritional and metabolic disease: Secondary | ICD-10-CM

## 2013-11-01 DIAGNOSIS — G44209 Tension-type headache, unspecified, not intractable: Secondary | ICD-10-CM

## 2013-11-01 DIAGNOSIS — Z950 Presence of cardiac pacemaker: Secondary | ICD-10-CM

## 2013-11-01 DIAGNOSIS — M6740 Ganglion, unspecified site: Secondary | ICD-10-CM

## 2013-11-01 DIAGNOSIS — I519 Heart disease, unspecified: Secondary | ICD-10-CM

## 2013-11-01 DIAGNOSIS — K5641 Fecal impaction: Secondary | ICD-10-CM

## 2013-11-01 DIAGNOSIS — K5900 Constipation, unspecified: Secondary | ICD-10-CM

## 2013-11-02 ENCOUNTER — Ambulatory Visit: Payer: Medicare Other | Admitting: Neurology

## 2013-11-02 ENCOUNTER — Ambulatory Visit (HOSPITAL_COMMUNITY)
Admission: RE | Admit: 2013-11-02 | Discharge: 2013-11-02 | Disposition: A | Discharge status: Home / Self Care | Payer: Medicare Other | Source: Ambulatory Visit | Attending: Radiology | Admitting: Radiology

## 2013-11-02 ENCOUNTER — Other Ambulatory Visit (HOSPITAL_COMMUNITY): Payer: Self-pay | Admitting: Interventional Radiology

## 2013-11-02 DIAGNOSIS — IMO0002 Reserved for concepts with insufficient information to code with codable children: Secondary | ICD-10-CM

## 2013-11-02 DIAGNOSIS — S22079A Unspecified fracture of T9-T10 vertebra, initial encounter for closed fracture: Secondary | ICD-10-CM

## 2013-11-02 DIAGNOSIS — S22089A Unspecified fracture of T11-T12 vertebra, initial encounter for closed fracture: Secondary | ICD-10-CM

## 2013-11-02 DIAGNOSIS — M549 Dorsalgia, unspecified: Secondary | ICD-10-CM

## 2013-11-03 ENCOUNTER — Ambulatory Visit: Payer: Medicare Other

## 2013-11-03 ENCOUNTER — Encounter (HOSPITAL_COMMUNITY): Payer: Self-pay | Admitting: Pharmacy Technician

## 2013-11-03 ENCOUNTER — Other Ambulatory Visit: Payer: Self-pay | Admitting: Radiology

## 2013-11-04 ENCOUNTER — Telehealth: Payer: Self-pay

## 2013-11-04 ENCOUNTER — Other Ambulatory Visit (HOSPITAL_COMMUNITY): Payer: Self-pay | Admitting: Interventional Radiology

## 2013-11-04 ENCOUNTER — Ambulatory Visit: Payer: Medicare Other

## 2013-11-04 ENCOUNTER — Ambulatory Visit (HOSPITAL_COMMUNITY): Admission: RE | Admit: 2013-11-04 | Payer: Medicare Other | Source: Ambulatory Visit

## 2013-11-04 ENCOUNTER — Ambulatory Visit (HOSPITAL_COMMUNITY)
Admission: RE | Admit: 2013-11-04 | Discharge: 2013-11-04 | Disposition: A | Discharge status: Home / Self Care | Payer: Medicare Other | Source: Ambulatory Visit | Attending: Interventional Radiology | Admitting: Interventional Radiology

## 2013-11-04 ENCOUNTER — Encounter (HOSPITAL_COMMUNITY): Payer: Self-pay

## 2013-11-04 VITALS — BP 172/76 | HR 80 | Temp 98.0°F | Resp 20 | Ht 65.5 in | Wt 140.0 lb

## 2013-11-04 DIAGNOSIS — S22089A Unspecified fracture of T11-T12 vertebra, initial encounter for closed fracture: Secondary | ICD-10-CM

## 2013-11-04 DIAGNOSIS — M545 Low back pain, unspecified: Secondary | ICD-10-CM | POA: Insufficient documentation

## 2013-11-04 DIAGNOSIS — M549 Dorsalgia, unspecified: Secondary | ICD-10-CM

## 2013-11-04 DIAGNOSIS — S22079A Unspecified fracture of T9-T10 vertebra, initial encounter for closed fracture: Secondary | ICD-10-CM

## 2013-11-04 DIAGNOSIS — M8448XA Pathological fracture, other site, initial encounter for fracture: Secondary | ICD-10-CM | POA: Insufficient documentation

## 2013-11-04 DIAGNOSIS — IMO0002 Reserved for concepts with insufficient information to code with codable children: Secondary | ICD-10-CM

## 2013-11-04 LAB — CBC
HEMATOCRIT: 34.4 % — AB (ref 36.0–46.0)
HEMOGLOBIN: 11 g/dL — AB (ref 12.0–15.0)
MCH: 26.6 pg (ref 26.0–34.0)
MCHC: 32 g/dL (ref 30.0–36.0)
MCV: 83.1 fL (ref 78.0–100.0)
Platelets: 178 10*3/uL (ref 150–400)
RBC: 4.14 MIL/uL (ref 3.87–5.11)
RDW: 13.7 % (ref 11.5–15.5)
WBC: 4.6 10*3/uL (ref 4.0–10.5)

## 2013-11-04 LAB — BASIC METABOLIC PANEL
BUN: 8 mg/dL (ref 6–23)
CHLORIDE: 98 meq/L (ref 96–112)
CO2: 25 mEq/L (ref 19–32)
Calcium: 9.8 mg/dL (ref 8.4–10.5)
Creatinine, Ser: 0.73 mg/dL (ref 0.50–1.10)
GFR calc non Af Amer: 82 mL/min — ABNORMAL LOW (ref >90)
Glucose, Bld: 102 mg/dL — ABNORMAL HIGH (ref 70–99)
POTASSIUM: 3.7 meq/L (ref 3.7–5.3)
Sodium: 138 mEq/L (ref 137–147)

## 2013-11-04 LAB — APTT: aPTT: 33 seconds (ref 24–37)

## 2013-11-04 LAB — PROTIME-INR
INR: 1.01 (ref 0.00–1.49)
Prothrombin Time: 13.1 seconds (ref 11.6–15.2)

## 2013-11-04 MED ORDER — SODIUM CHLORIDE 0.9 % IV SOLN: INTRAVENOUS | Status: DC

## 2013-11-04 MED ORDER — MIDAZOLAM HCL 2 MG/2ML IJ SOLN
INTRAMUSCULAR | Status: AC
  Filled 2013-11-04: qty 4

## 2013-11-04 MED ORDER — FENTANYL CITRATE 0.05 MG/ML IJ SOLN
INTRAMUSCULAR | Status: AC
  Filled 2013-11-04: qty 4

## 2013-11-04 MED ORDER — FENTANYL CITRATE 0.05 MG/ML IJ SOLN
INTRAMUSCULAR | Status: AC
  Filled 2013-11-04: qty 2

## 2013-11-04 MED ORDER — TOBRAMYCIN SULFATE 1.2 G IJ SOLR
INTRAMUSCULAR | Status: AC
  Filled 2013-11-04: qty 1.2

## 2013-11-04 MED ORDER — KETOROLAC TROMETHAMINE 30 MG/ML IJ SOLN
INTRAMUSCULAR | Status: AC
  Filled 2013-11-04: qty 1

## 2013-11-04 MED ORDER — HYDROMORPHONE HCL PF 1 MG/ML IJ SOLN
INTRAMUSCULAR | Status: AC
  Filled 2013-11-04: qty 2

## 2013-11-04 MED ORDER — HYDROMORPHONE HCL PF 1 MG/ML IJ SOLN
INTRAMUSCULAR | Status: AC | PRN
  Administered 2013-11-04 (×3): 1 mg via INTRAVENOUS

## 2013-11-04 MED ORDER — SODIUM CHLORIDE 0.9 % IV SOLN
Freq: Once | INTRAVENOUS | Status: AC
  Administered 2013-11-04: 11:00:00 via INTRAVENOUS

## 2013-11-04 MED ORDER — MIDAZOLAM HCL 2 MG/2ML IJ SOLN
INTRAMUSCULAR | Status: AC | PRN
  Administered 2013-11-04: 0.5 mg via INTRAVENOUS
  Administered 2013-11-04 (×2): 1 mg via INTRAVENOUS
  Administered 2013-11-04: 0.5 mg via INTRAVENOUS
  Administered 2013-11-04 (×2): 1 mg via INTRAVENOUS

## 2013-11-04 MED ORDER — DIPHENHYDRAMINE HCL 50 MG/ML IJ SOLN
25.0000 mg | Freq: Once | INTRAMUSCULAR | Status: AC
  Administered 2013-11-04: 25 mg via INTRAVENOUS

## 2013-11-04 MED ORDER — HYDROMORPHONE HCL PF 1 MG/ML IJ SOLN
INTRAMUSCULAR | Status: AC
  Filled 2013-11-04: qty 1

## 2013-11-04 MED ORDER — DIPHENHYDRAMINE HCL 50 MG/ML IJ SOLN
INTRAMUSCULAR | Status: AC
  Administered 2013-11-04: 25 mg via INTRAVENOUS
  Filled 2013-11-04: qty 1

## 2013-11-04 MED ORDER — FENTANYL CITRATE 0.05 MG/ML IJ SOLN
INTRAMUSCULAR | Status: AC | PRN
  Administered 2013-11-04 (×6): 25 ug via INTRAVENOUS

## 2013-11-04 MED ORDER — CEFAZOLIN SODIUM-DEXTROSE 2-3 GM-% IV SOLR
2.0000 g | Freq: Once | INTRAVENOUS | Status: AC
  Administered 2013-11-04: 2 g via INTRAVENOUS
  Filled 2013-11-04: qty 50

## 2013-11-04 MED ORDER — KETOROLAC TROMETHAMINE 30 MG/ML IJ SOLN
30.0000 mg | Freq: Once | INTRAMUSCULAR | Status: AC
  Administered 2013-11-04: 30 mg via INTRAVENOUS

## 2013-11-04 NOTE — Progress Notes (Signed)
Pts itching has returned.  Benedryl administered at pts request.  Will monitor x 1 hr before discharge.

## 2013-11-04 NOTE — Telephone Encounter (Signed)
Patient requested Prolia, Last injection was done in October. Benefits verified and an apt has been schedule. Verification form sent to be scanned    KP

## 2013-11-04 NOTE — Procedures (Signed)
S/p T11KP,T12 KP and L5 KP for painful compression fracture.

## 2013-11-04 NOTE — H&P (Signed)
[name] is an 73 y.o. female.   Chief Complaint: Pt has had recent KP/VP Thoracic 9; Lumbar 2 and 4 all treated 09/2013 Did well with pain relief x 2-3 weeks New severe pain developed Bone scan from 09/2013 does reveal uptake at T 10/11 and 12 CT T/L spine to be performed today now Plan for T10/11 and 12 Kyphoplasty/vertebroplasty dependent on CT result  HPI: HTN; osteoporosis;MR; pacemaker; CHF  Past Medical History  Diagnosis Date  . Atrioventricular block, complete     a. 08/2011 Upgrage of PPM to MDT Adapta L Dual Chamber PPM ser # [id number].  . Diastolic dysfunction   . HTN (hypertension)   . Acute neck pain   . Tension headache   . Multinodular goiter   . Contact dermatitis and eczema     unspec cause  . Tooth abscess   . Thyroid nodule   . Osteoporosis   . GERD (gastroesophageal reflux disease)   . Depression   . Lung nodule   . Superficial thrombophlebitis   . Hypothyroidism   . Acute sinusitis   . Mitral regurgitation     a.03/2012 Echo: EF 65-70%, mod MR  . Chest pain     a. 09/2007 Myoview: EF 77%, no ischemia/infarct.  [name] PONV (postoperative nausea and vomiting)   . Pacemaker   . CHF (congestive heart failure)     Past Surgical History  Procedure Laterality Date  . Laporatomy for ruptured diverticulum with colostomy  2003  . Take down of colostomy  2004  . Exploratory laparotomy    . Medtronic pacemaker  05/2003  . Hernia repair    . Colon surgery    . Thyroidectomy N/A 08/07/2012    Procedure: THYROIDECTOMY;  Surgeon: Earnstine Regal, MD;  Location: [facility];  Service: General;  Laterality: N/A;  . Colonoscopy  01/19/2013    colonoscopy    Family History  Problem Relation Age of Onset  . Heart disease Mother   . Heart failure Father   . Diverticulosis Sister   . Hypertension Brother   . Hypertension Brother   . Cancer Son     breast   Social History:  reports that she quit smoking about 50 years ago. She has never used smokeless tobacco. She  reports that she does not drink alcohol or use illicit drugs.  Allergies:  Allergies  Allergen Reactions  . Tape Rash  . Codeine Nausea And Vomiting     (Not in a hospital admission)  Results for orders placed during the hospital encounter of 11/04/13 (from the past 48 hour(s))  APTT     Status: None   Collection Time    11/04/13  9:40 AM      Result Value Ref Range   aPTT 33  24 - 37 seconds  BASIC METABOLIC PANEL     Status: Abnormal   Collection Time    11/04/13  9:40 AM      Result Value Ref Range   Sodium 138  137 - 147 mEq/L   Potassium 3.7  3.7 - 5.3 mEq/L   Chloride 98  96 - 112 mEq/L   CO2 25  19 - 32 mEq/L   Glucose, Bld 102 (*) 70 - 99 mg/dL   BUN 8  6 - 23 mg/dL   Creatinine, Ser 0.73  0.50 - 1.10 mg/dL   Calcium 9.8  8.4 - 10.5 mg/dL   GFR calc non Af Amer 82 (*) >90 mL/min   GFR calc  Af Amer >90  >90 mL/min   Comment: (NOTE)     The eGFR has been calculated using the CKD EPI equation.     This calculation has not been validated in all clinical situations.     eGFR's persistently <90 mL/min signify possible Chronic Kidney     Disease.  CBC     Status: Abnormal   Collection Time    11/04/13  9:40 AM      Result Value Ref Range   WBC 4.6  4.0 - 10.5 K/uL   RBC 4.14  3.87 - 5.11 MIL/uL   Hemoglobin 11.0 (*) 12.0 - 15.0 g/dL   HCT 34.4 (*) 36.0 - 46.0 %   MCV 83.1  78.0 - 100.0 fL   MCH 26.6  26.0 - 34.0 pg   MCHC 32.0  30.0 - 36.0 g/dL   RDW 13.7  11.5 - 15.5 %   Platelets 178  150 - 400 K/uL  PROTIME-INR     Status: None   Collection Time    11/04/13  9:40 AM      Result Value Ref Range   Prothrombin Time 13.1  11.6 - 15.2 seconds   INR 1.01  0.00 - 1.49   Ir Radiologist Eval & Mgmt  11/03/2013   EXAM: ESTABLISHED PATIENT OFFICE VISIT -LEVEL II (88416)  CHIEF COMPLAINT: Continuous low back pain. History of multiple thoracolumbar fractures.  Current Pain Level: 1-10  HISTORY OF PRESENT ILLNESS: Patient is a 74 year old right handed lady status post  vertebral body augmentation at T9, L2 and L4 for painful compression fractures approximately 3 weeks ago.  Following the procedure the patient noted modest improvement in her pain symptoms. She was able to reduce her hydrocodone usage up until 7 days ago when her pain apparently started getting worse.  At the present time the patient reports the pain as 6-7 out of 10 at rest even with pain medication. She takes hydrocodone every 6 hours.  The patient reports that she is able to walk but with difficulty. She denies any radiation of pain into the lower extremities. Denies any autonomic dysfunction of her bowel or bladder.  The patient apparently is only able to sleep on a dinner table with some pillows.  Patient denies recent chills or fever or rigors. She denies any change in her symptomatology on review of systems.  The remainder of her history remains unchanged.  Medications: OsCal 500 mg 3 times a day. Coricidin. Prolia. Tylenol p.m. Synthroid. Lisinopril/hydrochlorothiazide combination. Multi-vitamins. Prilosec. Colace. MiraLax.  PHYSICAL EXAMINATION: On brief examination the patient is in apparent distress on account of her pain. The patient is slightly lurched forward. She demonstrates exquisite tenderness in the thoracolumbar region. Neurologically, otherwise, the patient is awake, alert and oriented to time, place, space.  ASSESSMENT AND PLAN: The patient apparently has 3 of the fractures at T10, T11 and T12 seen on the previous bone scan.  Since the patient also reports worsening pain in the lower lumbosacral region, a CT scan of the thoracolumbar spine from T10 to S1 will be obtained with bone windows to evaluate for new fracturing in this area. Otherwise, will proceed with vertebral body augmentation at T10, T11 and T12. The patient is agreeable to this. This will be scheduled at the earliest possible. In the meantime, the patient has been asked to start using a walker whenever ambulating. She has been  advised strongly to refrain from stooping, bending or lifting anything above 10 pounds.   Electronically Signed  By: Luanne Bras M.D.   On: 11/02/2013 13:30    Review of Systems  Constitutional: Negative for fever.  Respiratory: Negative for shortness of breath.   Cardiovascular: Negative for chest pain.  Gastrointestinal: Negative for nausea, vomiting and abdominal pain.  Musculoskeletal: Positive for back pain.  Neurological: Positive for weakness. Negative for dizziness.    Blood pressure 184/86, pulse 79, temperature 97.4 F (36.3 C), temperature source Oral, resp. rate 20, height 5' 5.5" (1.664 m), weight 63.504 kg (140 lb), SpO2 95.00%. Physical Exam  Constitutional: She is oriented to person, place, and time.  Thin/frail  Cardiovascular: Normal rate.   Murmur heard. Irreg  Respiratory: Effort normal. She has no wheezes.  GI: Soft. Bowel sounds are normal. There is no tenderness.  Musculoskeletal: Normal range of motion.  Moves all 4s Severe pain to move back   Neurological: She is alert and oriented to person, place, and time.  Skin: Skin is warm and dry.  Psychiatric: She has a normal mood and affect. Her behavior is normal. Judgment and thought content normal.     Assessment/Plan T9/L2/L4 KP/VP 09/2013 Good pain relief x few weeks New pain Now rescheduled for possible T10/11/12 KP/VP dependent on CT T/L spine result today. Pt aware of procedure benefits and risks and agreeable to proceed Consent signed and in chart  Lavonia Drafts 11/04/2013, 10:45 AM

## 2013-11-04 NOTE — Progress Notes (Signed)
Pt complained of itching. No redness, rash or hives noted.  Dr. Estanislado Pandy notified.  Order received for benedryl.  Upon entering pts room to administer  The benedryl, pt states she would like to wait because the itching has subsided.

## 2013-11-04 NOTE — Discharge Instructions (Signed)
1. No stooping ,bending lifting more tha 10 lbs for 2 weeks. 2.Use walker to ambulate for 2 weeks. 3.RTC in 2 weeks. KYPHOPLASTY/VERTEBROPLASTY DISCHARGE INSTRUCTIONS  Medications: (check all that apply)     Resume all home medications as before procedure.       Resume your (aspirin/Plavix/Coumadin) on .                  Continue your pain medications as prescribed as needed.  Over the next 3-5 days, decrease your pain medication as tolerated.  Over the counter medications (i.e. Tylenol, ibuprofen, and aleve) may be substituted once severe/moderate pain symptoms have subsided.   Wound Care:   Bandages may be removed the day following your procedure.  You may get your incision wet once bandages are removed.  Bandaids may be used to cover the incisions until scab formation.  Topical ointments are optional.    If you develop a fever greater than 101 degrees, have increased skin redness at the incision sites or pus-like oozing from incisions occurring within 1 week of the procedure, contact radiology at [phone] or [phone].    Ice pack to back for 15-20 minutes 2-3 time per day for first 2-3 days post procedure.  The ice will expedite muscle healing and help with the pain from the incisions.   Activity:   Bedrest today with limited activity for 24 hours post procedure.    No driving for 48 hours.    Increase your activity as tolerated after bedrest (with assistance if necessary).    Refrain from any strenuous activity or heavy lifting (greater than 10 lbs.).   Follow up:   Contact radiology at [phone] or [phone] if any questions/concerns.    A physician assistant from radiology will contact you in approximately 1 week.    If a biopsy was performed at the time of your procedure, your referring physician should receive the results in usually 2-3 days.

## 2013-11-09 ENCOUNTER — Ambulatory Visit (INDEPENDENT_AMBULATORY_CARE_PROVIDER_SITE_OTHER): Payer: Medicare Other | Admitting: *Deleted

## 2013-11-09 DIAGNOSIS — M810 Age-related osteoporosis without current pathological fracture: Secondary | ICD-10-CM

## 2013-11-09 MED ORDER — DENOSUMAB 60 MG/ML ~~LOC~~ SOLN
60.0000 mg | Freq: Once | SUBCUTANEOUS | Status: AC
  Administered 2013-11-09: 60 mg via SUBCUTANEOUS

## 2013-11-11 ENCOUNTER — Telehealth: Payer: Self-pay | Admitting: Family Medicine

## 2013-11-11 NOTE — Telephone Encounter (Signed)
I don't feel comfortable calling grand daughter w/o speaking to pt directly when it comes to pain and controlled substances.  This is not how we practice.  If they need medications adjusted, we need to speak w/ pt

## 2013-11-11 NOTE — Telephone Encounter (Signed)
Hx of recent compression fracture to her back due to severe Osteoporosis. Seen 10/26/13 as a new patient. Please advise in Dr.Lowne absence      KP

## 2013-11-11 NOTE — Telephone Encounter (Signed)
Caller name: Danye Relation to pt: Call back number:   Reason for call:  Pt's grand daughter called in stating that the RX HYDROcodone-acetaminophen ([place]) 10-325 MG is not working for the pt.  Pt's grand daughter states that she needs something else and to call her not pt because pt does not feel like talking.  Please advise

## 2013-11-11 NOTE — Telephone Encounter (Signed)
Spoke with Daughter Andria Frames, Madaline Brilliant per HIPAA and made her aware of the multiple calls, she said the medication is not working and they are wanting to have her mother seen again to discuss with Dr. Etter Sjogren. I advise we are not at liberty to discuss any of her medical information with anyone other than who is listed on the DPR, she said her daughter will be coming in tomorrow and I made her aware at that time if her mother wants then she can update her DPR. Andria Frames has agreed and the apt has been scheduled for tomorrow.        KP

## 2013-11-11 NOTE — Telephone Encounter (Signed)
Caller name: Silva Bandy Relation to pt: daughter in law Call back number: [phone]   Reason for call:  Daughter in law called in stating that the pt needs something stronger for pt because pt's brother passed away and she is going to be traveling to the funeral.

## 2013-11-12 ENCOUNTER — Encounter: Payer: Self-pay | Admitting: Family Medicine

## 2013-11-12 ENCOUNTER — Ambulatory Visit (INDEPENDENT_AMBULATORY_CARE_PROVIDER_SITE_OTHER): Payer: Medicare Other | Admitting: Family Medicine

## 2013-11-12 VITALS — BP 160/90 | HR 91 | Temp 97.7°F | Wt 139.8 lb

## 2013-11-12 DIAGNOSIS — M8448XD Pathological fracture, other site, subsequent encounter for fracture with routine healing: Secondary | ICD-10-CM

## 2013-11-12 DIAGNOSIS — M810 Age-related osteoporosis without current pathological fracture: Secondary | ICD-10-CM

## 2013-11-12 DIAGNOSIS — I10 Essential (primary) hypertension: Secondary | ICD-10-CM

## 2013-11-12 DIAGNOSIS — M549 Dorsalgia, unspecified: Secondary | ICD-10-CM

## 2013-11-12 MED ORDER — HYDROMORPHONE HCL 4 MG PO TABS: 4.0000 mg | ORAL_TABLET | ORAL | Status: DC | PRN

## 2013-11-12 NOTE — Progress Notes (Signed)
Pre visit review using our clinic review tool, if applicable. No additional management support is needed unless otherwise documented below in the visit note. 

## 2013-11-12 NOTE — Progress Notes (Signed)
   Subjective:    Patient ID: [name], female    DOB: 05/18/1941, 73 y.o.   MRN: [id number]  HPI Pt here f/u kyphoplasty-- no pain relief.  Pt sent back here for f/u .       Review of Systems As above    Objective:   Physical Exam BP 160/90  Pulse 91  Temp(Src) 97.7 F (36.5 C) (Oral)  Wt 139 lb 12.8 oz (63.413 kg)  SpO2 93% General appearance: alert, cooperative, appears stated age and severe distress Neurologic: Motor: pt is in too much pain to move,  in a wheelchair       Assessment & Plan:  1. Back pain No relief with kyphoplasty Refer to ortho and change pain meds for now - Ambulatory referral to Orthopedic Surgery - HYDROmorphone (DILAUDID) 4 MG tablet; Take 1 tablet (4 mg total) by mouth every 4 (four) hours as needed for severe pain.  Dispense: 60 tablet; Refill: 0

## 2013-11-12 NOTE — Patient Instructions (Signed)
Back Pain, Adult Low back pain is very common. About 1 in 5 people have back pain.The cause of low back pain is rarely dangerous. The pain often gets better over time.About half of people with a sudden onset of back pain feel better in just 2 weeks. About 8 in 10 people feel better by 6 weeks.  CAUSES Some common causes of back pain include:  Strain of the muscles or ligaments supporting the spine.  Wear and tear (degeneration) of the spinal discs.  Arthritis.  Direct injury to the back. DIAGNOSIS Most of the time, the direct cause of low back pain is not known.However, back pain can be treated effectively even when the exact cause of the pain is unknown.Answering your caregiver's questions about your overall health and symptoms is one of the most accurate ways to make sure the cause of your pain is not dangerous. If your caregiver needs more information, he or she may order lab work or imaging tests (X-rays or MRIs).However, even if imaging tests show changes in your back, this usually does not require surgery. HOME CARE INSTRUCTIONS For many people, back pain returns.Since low back pain is rarely dangerous, it is often a condition that people can learn to manageon their own.   Remain active. It is stressful on the back to sit or stand in one place. Do not sit, drive, or stand in one place for more than 30 minutes at a time. Take short walks on level surfaces as soon as pain allows.Try to increase the length of time you walk each day.  Do not stay in bed.Resting more than 1 or 2 days can delay your recovery.  Do not avoid exercise or work.Your body is made to move.It is not dangerous to be active, even though your back may hurt.Your back will likely heal faster if you return to being active before your pain is gone.  Pay attention to your body when you bend and lift. Many people have less discomfortwhen lifting if they bend their knees, keep the load close to their bodies,and  avoid twisting. Often, the most comfortable positions are those that put less stress on your recovering back.  Find a comfortable position to sleep. Use a firm mattress and lie on your side with your knees slightly bent. If you lie on your back, put a pillow under your knees.  Only take over-the-counter or prescription medicines as directed by your caregiver. Over-the-counter medicines to reduce pain and inflammation are often the most helpful.Your caregiver may prescribe muscle relaxant drugs.These medicines help dull your pain so you can more quickly return to your normal activities and healthy exercise.  Put ice on the injured area.  Put ice in a plastic bag.  Place a towel between your skin and the bag.  Leave the ice on for 15-20 minutes, 03-04 times a day for the first 2 to 3 days. After that, ice and heat may be alternated to reduce pain and spasms.  Ask your caregiver about trying back exercises and gentle massage. This may be of some benefit.  Avoid feeling anxious or stressed.Stress increases muscle tension and can worsen back pain.It is important to recognize when you are anxious or stressed and learn ways to manage it.Exercise is a great option. SEEK MEDICAL CARE IF:  You have pain that is not relieved with rest or medicine.  You have pain that does not improve in 1 week.  You have new symptoms.  You are generally not feeling well. SEEK   IMMEDIATE MEDICAL CARE IF:   You have pain that radiates from your back into your legs.  You develop new bowel or bladder control problems.  You have unusual weakness or numbness in your arms or legs.  You develop nausea or vomiting.  You develop abdominal pain.  You feel faint. Document Released: 06/17/2005 Document Revised: 12/17/2011 Document Reviewed: 11/05/2010 ExitCare Patient Information 2014 [employer].  

## 2013-11-15 ENCOUNTER — Telehealth: Payer: Self-pay | Admitting: Family Medicine

## 2013-11-15 MED ORDER — TIZANIDINE HCL 4 MG PO TABS: 4.0000 mg | ORAL_TABLET | Freq: Every evening | ORAL | Status: DC | PRN

## 2013-11-15 NOTE — Telephone Encounter (Signed)
Caller name:Danye Relation to pt :grandchild Call back number:[phone] Pharmacy:[id number] - [place], [place]   Reason for call: to request a refill for tiZANidine (ZANAFLEX) 4 MG tablet

## 2013-11-15 NOTE — Telephone Encounter (Signed)
Last seen 11/12/13 and filled 10/26/13 #30.  Patient is taking every 6 hours.  Please advise     KP

## 2013-11-15 NOTE — Telephone Encounter (Signed)
Rx faxed.    KP 

## 2013-11-15 NOTE — Telephone Encounter (Signed)
Refill x1   1 refill

## 2013-11-16 ENCOUNTER — Telehealth: Payer: Self-pay | Admitting: *Deleted

## 2013-11-16 NOTE — Telephone Encounter (Signed)
Received home health certification and plan of care via fax from [facility]. Billing sheet attached and placed in folder for Dr. Lowne. JG//CMA 

## 2013-11-23 ENCOUNTER — Other Ambulatory Visit (HOSPITAL_COMMUNITY): Payer: Self-pay | Admitting: Interventional Radiology

## 2013-11-23 DIAGNOSIS — IMO0002 Reserved for concepts with insufficient information to code with codable children: Secondary | ICD-10-CM

## 2013-11-23 DIAGNOSIS — M549 Dorsalgia, unspecified: Secondary | ICD-10-CM

## 2013-12-01 ENCOUNTER — Ambulatory Visit (HOSPITAL_COMMUNITY): Admission: RE | Admit: 2013-12-01 | Payer: Medicare Other | Source: Ambulatory Visit

## 2013-12-01 ENCOUNTER — Telehealth: Payer: Self-pay | Admitting: Family Medicine

## 2013-12-01 NOTE — Telephone Encounter (Signed)
Pt scheduled  

## 2013-12-01 NOTE — Telephone Encounter (Signed)
Caller name: Amy Relation to pt:RN [place] Call back number: [phone]   Reason for call:   Rn wants to know if it is ok to discharge pt from home visits due to them only being able to see the pt for 1 RN visit and 1 PT visit due to pt having numerous operations during the month of May and the pain associated with chronic back pain.  Amy is needing a verbal order if this is ok.

## 2013-12-01 NOTE — Telephone Encounter (Signed)
Left 4 VM's to reschedule pt's appointment.

## 2013-12-01 NOTE — Telephone Encounter (Signed)
Ok to give verbal 

## 2013-12-01 NOTE — Telephone Encounter (Signed)
Recommendations please? 

## 2013-12-02 ENCOUNTER — Ambulatory Visit (INDEPENDENT_AMBULATORY_CARE_PROVIDER_SITE_OTHER): Payer: Medicare Other | Admitting: *Deleted

## 2013-12-02 DIAGNOSIS — I442 Atrioventricular block, complete: Secondary | ICD-10-CM

## 2013-12-02 LAB — MDC_IDC_ENUM_SESS_TYPE_INCLINIC
Battery Impedance: 156 Ohm
Battery Remaining Longevity: 122 mo
Battery Voltage: 2.78 V
Brady Statistic AP VP Percent: 2 %
Brady Statistic AP VS Percent: 0 %
Brady Statistic AS VP Percent: 98 %
Lead Channel Impedance Value: 542 Ohm
Lead Channel Pacing Threshold Amplitude: 0.5 V
Lead Channel Pacing Threshold Amplitude: 0.5 V
Lead Channel Sensing Intrinsic Amplitude: 4 mV
Lead Channel Setting Pacing Amplitude: 2.5 V
Lead Channel Setting Pacing Pulse Width: 0.46 ms
MDC IDC MSMT LEADCHNL RA PACING THRESHOLD PULSEWIDTH: 0.4 ms
MDC IDC MSMT LEADCHNL RV IMPEDANCE VALUE: 593 Ohm
MDC IDC MSMT LEADCHNL RV PACING THRESHOLD PULSEWIDTH: 0.46 ms
MDC IDC SESS DTM: 20150604152507
MDC IDC SET LEADCHNL RA PACING AMPLITUDE: 2 V
MDC IDC SET LEADCHNL RV SENSING SENSITIVITY: 2.8 mV
MDC IDC STAT BRADY AS VS PERCENT: 0 %

## 2013-12-02 NOTE — Telephone Encounter (Signed)
Enacted orders. [name] with advance notified. Patient has appt with PCP 12/09/2013.

## 2013-12-03 ENCOUNTER — Ambulatory Visit: Payer: Medicare Other | Admitting: Family Medicine

## 2013-12-03 ENCOUNTER — Telehealth: Payer: Self-pay | Admitting: *Deleted

## 2013-12-03 NOTE — Telephone Encounter (Signed)
To MD to for advice    KP

## 2013-12-03 NOTE — Progress Notes (Signed)
Pacemaker check in clinic. Normal device function. Thresholds, sensing, impedances consistent with previous measurements. Device programmed to maximize longevity. 743 mode switch episodes (0.5%)---254 AHR episodes---max dur. 3 hours, Max A >400, Max V 167, Max Avg V 89---PACs/AF. No high ventricular rates noted. Device programmed at appropriate safety margins. Histogram distribution appropriate for patient activity level. Device programmed to optimize intrinsic conduction. Estimated longevity 10 years. Patient will follow up with SK in 03-2014.

## 2013-12-03 NOTE — Telephone Encounter (Signed)
Caller name:  Carlton Relation to pt:  self Call back number: [phone] Pharmacy:  Reason for call:   Pt called and made appt with Lowne on Tuesday 12/07/2013 for pain on her right side rib area.  She states she has seen Dr. Etter Sjogren several times and wants to know if she still needs the New Patient appt scheduled 12/09/2013 at 11am.  Please advise.  bw

## 2013-12-04 ENCOUNTER — Other Ambulatory Visit: Payer: Self-pay | Admitting: Internal Medicine

## 2013-12-06 ENCOUNTER — Telehealth (HOSPITAL_COMMUNITY): Payer: Self-pay | Admitting: Interventional Radiology

## 2013-12-06 NOTE — Telephone Encounter (Signed)
Called pt, left VM for her to call to reschedule her 2 wk f/u L2 VP JMichaux

## 2013-12-06 NOTE — Telephone Encounter (Signed)
No---just cpe appointment when due

## 2013-12-07 ENCOUNTER — Telehealth: Payer: Self-pay | Admitting: *Deleted

## 2013-12-07 ENCOUNTER — Telehealth: Payer: Self-pay | Admitting: Family Medicine

## 2013-12-07 ENCOUNTER — Encounter: Payer: Self-pay | Admitting: Family Medicine

## 2013-12-07 ENCOUNTER — Ambulatory Visit (INDEPENDENT_AMBULATORY_CARE_PROVIDER_SITE_OTHER): Payer: Medicare Other | Admitting: Family Medicine

## 2013-12-07 VITALS — BP 140/90 | HR 74 | Temp 98.1°F | Ht 65.5 in | Wt 129.2 lb

## 2013-12-07 DIAGNOSIS — Z79899 Other long term (current) drug therapy: Secondary | ICD-10-CM | POA: Diagnosis not present

## 2013-12-07 DIAGNOSIS — R109 Unspecified abdominal pain: Secondary | ICD-10-CM | POA: Diagnosis not present

## 2013-12-07 DIAGNOSIS — I10 Essential (primary) hypertension: Secondary | ICD-10-CM

## 2013-12-07 DIAGNOSIS — T50904A Poisoning by unspecified drugs, medicaments and biological substances, undetermined, initial encounter: Secondary | ICD-10-CM

## 2013-12-07 DIAGNOSIS — R319 Hematuria, unspecified: Secondary | ICD-10-CM

## 2013-12-07 DIAGNOSIS — K5909 Other constipation: Secondary | ICD-10-CM

## 2013-12-07 DIAGNOSIS — K5903 Drug induced constipation: Secondary | ICD-10-CM

## 2013-12-07 DIAGNOSIS — K219 Gastro-esophageal reflux disease without esophagitis: Secondary | ICD-10-CM

## 2013-12-07 LAB — CBC WITH DIFFERENTIAL/PLATELET
BASOS ABS: 0 10*3/uL (ref 0.0–0.1)
Basophils Relative: 0.5 % (ref 0.0–3.0)
Eosinophils Absolute: 0 10*3/uL (ref 0.0–0.7)
Eosinophils Relative: 0.4 % (ref 0.0–5.0)
HCT: 37.8 % (ref 36.0–46.0)
Hemoglobin: 12.7 g/dL (ref 12.0–15.0)
LYMPHS PCT: 16.5 % (ref 12.0–46.0)
Lymphs Abs: 0.8 10*3/uL (ref 0.7–4.0)
MCHC: 33.6 g/dL (ref 30.0–36.0)
MCV: 80.6 fl (ref 78.0–100.0)
Monocytes Absolute: 0.4 10*3/uL (ref 0.1–1.0)
Monocytes Relative: 8.9 % (ref 3.0–12.0)
Neutro Abs: 3.7 10*3/uL (ref 1.4–7.7)
Neutrophils Relative %: 73.7 % (ref 43.0–77.0)
Platelets: 204 10*3/uL (ref 150.0–400.0)
RBC: 4.7 Mil/uL (ref 3.87–5.11)
RDW: 14.7 % (ref 11.5–15.5)
WBC: 5.1 10*3/uL (ref 4.0–10.5)

## 2013-12-07 LAB — POCT URINALYSIS DIPSTICK
Bilirubin, UA: NEGATIVE
Glucose, UA: NEGATIVE
Leukocytes, UA: NEGATIVE
Nitrite, UA: NEGATIVE
PH UA: 6.5
PROTEIN UA: NEGATIVE
SPEC GRAV UA: 1.01
Urobilinogen, UA: 0.2

## 2013-12-07 LAB — HEPATIC FUNCTION PANEL
ALK PHOS: 83 U/L (ref 39–117)
ALT: 23 U/L (ref 0–35)
AST: 28 U/L (ref 0–37)
Albumin: 4.5 g/dL (ref 3.5–5.2)
Bilirubin, Direct: 0.2 mg/dL (ref 0.0–0.3)
TOTAL PROTEIN: 7.2 g/dL (ref 6.0–8.3)
Total Bilirubin: 0.8 mg/dL (ref 0.2–1.2)

## 2013-12-07 LAB — BASIC METABOLIC PANEL
BUN: 12 mg/dL (ref 6–23)
CHLORIDE: 96 meq/L (ref 96–112)
CO2: 25 meq/L (ref 19–32)
CREATININE: 0.7 mg/dL (ref 0.4–1.2)
Calcium: 8.7 mg/dL (ref 8.4–10.5)
GFR: 112.18 mL/min (ref >60.00)
Glucose, Bld: 100 mg/dL — ABNORMAL HIGH (ref 70–99)
Potassium: 3.6 mEq/L (ref 3.5–5.1)
Sodium: 133 mEq/L — ABNORMAL LOW (ref 135–145)

## 2013-12-07 LAB — HEMOGLOBIN A1C: Hgb A1c MFr Bld: 5.2 % (ref 4.6–6.5)

## 2013-12-07 LAB — AMYLASE: Amylase: 69 U/L (ref 27–131)

## 2013-12-07 LAB — LIPASE: Lipase: 18 U/L (ref 11.0–59.0)

## 2013-12-07 MED ORDER — OMEPRAZOLE 20 MG PO CPDR
20.0000 mg | DELAYED_RELEASE_CAPSULE | Freq: Two times a day (BID) | ORAL | Status: DC
Start: 2013-12-07 — End: 2016-03-06

## 2013-12-07 MED ORDER — VALACYCLOVIR HCL 1 G PO TABS: 1000.0000 mg | ORAL_TABLET | Freq: Three times a day (TID) | ORAL | Status: DC

## 2013-12-07 MED ORDER — LISINOPRIL-HYDROCHLOROTHIAZIDE 20-12.5 MG PO TABS: ORAL_TABLET | ORAL | Status: DC

## 2013-12-07 MED ORDER — GI COCKTAIL ~~LOC~~
30.0000 mL | Freq: Once | ORAL | Status: AC
  Administered 2013-12-07: 30 mL via ORAL

## 2013-12-07 NOTE — Telephone Encounter (Signed)
Caller name:  Adryan Relation to pt:  self Call back number:  [phone] Pharmacy:  Reason for call:   Pt was seen this morning.  She is not sure if she is supposed to request information for you from her GI, or if she is supposed to see her GI about dx of todays visit.  Please advise.  bw

## 2013-12-07 NOTE — Telephone Encounter (Signed)
Please advise if this patient needs to follow up with her GI doc.     KP

## 2013-12-07 NOTE — Telephone Encounter (Signed)
She was trying to decide if she was going to go back to her GI or see someone different. She can make appointment with Dr Cristina Gong or we can put a referral in if that is where she want to go

## 2013-12-07 NOTE — Telephone Encounter (Signed)
Caller name: Joci Relation to pt: self  Call back number: [phone] Pharmacy: [facility]  Reason for call:   Pt states that she was supposed to get a paper copy of an RX for her rash that she mgiht get.  Pt states that she did not get the script.  Please advise pt.

## 2013-12-07 NOTE — Telephone Encounter (Signed)
I meant to give her a rx for valtrex 1000 mg 1 po tid for 10 days

## 2013-12-07 NOTE — Patient Instructions (Addendum)
Abdominal Pain, Adult Many things can cause abdominal pain. Usually, abdominal pain is not caused by a disease and will improve without treatment. It can often be observed and treated at home. Your health care provider will do a physical exam and possibly order blood tests and X-rays to help determine the seriousness of your pain. However, in many cases, more time must pass before a clear cause of the pain can be found. Before that point, your health care provider may not know if you need more testing or further treatment. HOME CARE INSTRUCTIONS  Monitor your abdominal pain for any changes. The following actions may help to alleviate any discomfort you are experiencing:  Only take over-the-counter or prescription medicines as directed by your health care provider.  Do not take laxatives unless directed to do so by your health care provider.  Try a clear liquid diet (broth, tea, or water) as directed by your health care provider. Slowly move to a bland diet as tolerated. SEEK MEDICAL CARE IF:  You have unexplained abdominal pain.  You have abdominal pain associated with nausea or diarrhea.  You have pain when you urinate or have a bowel movement.  You experience abdominal pain that wakes you in the night.  You have abdominal pain that is worsened or improved by eating food.  You have abdominal pain that is worsened with eating fatty foods. SEEK IMMEDIATE MEDICAL CARE IF:   Your pain does not go away within 2 hours.  You have a fever.  You keep throwing up (vomiting).  Your pain is felt only in portions of the abdomen, such as the right side or the left lower portion of the abdomen.  You pass bloody or black tarry stools. MAKE SURE YOU:  Understand these instructions.   Will watch your condition.   Will get help right away if you are not doing well or get worse.  Document Released: 03/27/2005 Document Revised: 04/07/2013 Document Reviewed: 02/24/2013 [facility] Patient  Information 2014 [place]. Diet for Gastroesophageal Reflux Disease, Adult Reflux (acid reflux) is when acid from your stomach flows up into the esophagus. When acid comes in contact with the esophagus, the acid causes irritation and soreness (inflammation) in the esophagus. When reflux happens often or so severely that it causes damage to the esophagus, it is called gastroesophageal reflux disease (GERD). Nutrition therapy can help ease the discomfort of GERD. FOODS OR DRINKS TO AVOID OR LIMIT  Smoking or chewing tobacco. Nicotine is one of the most potent stimulants to acid production in the gastrointestinal tract.  Caffeinated and decaffeinated coffee and black tea.  Regular or low-calorie carbonated beverages or energy drinks (caffeine-free carbonated beverages are allowed).   Strong spices, such as black pepper, white pepper, red pepper, cayenne, curry powder, and chili powder.  Peppermint or spearmint.  Chocolate.  High-fat foods, including meats and fried foods. Extra added fats including oils, butter, salad dressings, and nuts. Limit these to less than 8 tsp per day.  Fruits and vegetables if they are not tolerated, such as citrus fruits or tomatoes.  Alcohol.  Any food that seems to aggravate your condition. If you have questions regarding your diet, call your caregiver or a registered dietitian. OTHER THINGS THAT MAY HELP GERD INCLUDE:   Eating your meals slowly, in a relaxed setting.  Eating 5 to 6 small meals per day instead of 3 large meals.  Eliminating food for a period of time if it causes distress.  Not lying down until 3 hours  after eating a meal.  Keeping the head of your bed raised 6 to 9 inches (15 to 23 cm) by using a foam wedge or blocks under the legs of the bed. Lying flat may make symptoms worse.  Being physically active. Weight loss may be helpful in reducing reflux in overweight or obese adults.  Wear loose fitting clothing EXAMPLE MEAL  PLAN This meal plan is approximately 2,000 calories based on [email or web address] meal planning guidelines. Breakfast   cup cooked oatmeal.  1 cup strawberries.  1 cup low-fat milk.  1 oz almonds. Snack  1 cup cucumber slices.  6 oz yogurt (made from low-fat or fat-free milk). Lunch  2 slice whole-wheat bread.  2 oz sliced [place].  2 tsp mayonnaise.  1 cup blueberries.  1 cup snap peas. Snack  6 whole-wheat crackers.  1 oz string cheese. Dinner   cup brown rice.  1 cup mixed veggies.  1 tsp olive oil.  3 oz grilled fish. Document Released: 06/17/2005 Document Revised: 09/09/2011 Document Reviewed: 05/03/2011 [facility] Patient Information 2014 [place], [place].

## 2013-12-07 NOTE — Telephone Encounter (Signed)
Please advise on medication for Rash.      KP

## 2013-12-07 NOTE — Progress Notes (Signed)
Pre visit review using our clinic review tool, if applicable. No additional management support is needed unless otherwise documented below in the visit note. 

## 2013-12-07 NOTE — Progress Notes (Signed)
  Subjective:     [name] is a 73 y.o. female who presents for evaluation of abdominal pain. Onset was several days ago. Symptoms have been gradually worsening. The pain is described as cramping and sharp, and is 5/10 in intensity. Pain is located in the RUQ and epigastric region with radiation to right back.  Aggravating factors: eating.  Alleviating factors: H2 blockers. Associated symptoms: anorexia, belching, constipation, nausea and vomiting. The patient denies chills, diarrhea, dysuria, fever, flatus, frequency, headache, hematochezia, hematuria, melena, myalgias and sweats.  The patient's history has been marked as reviewed and updated as appropriate.  Review of Systems Pertinent items are noted in HPI.     Objective:    BP 140/90  Pulse 74  Temp(Src) 98.1 F (36.7 C) (Oral)  Ht 5' 5.5" (1.664 m)  Wt 129 lb 3.2 oz (58.605 kg)  BMI 21.17 kg/m2  SpO2 98% General appearance: alert, cooperative and no distress Throat: lips, mucosa, and tongue normal; teeth and gums normal Neck: no adenopathy, supple, symmetrical, trachea midline and thyroid not enlarged, symmetric, no tenderness/mass/nodules Heart: regular rate and rhythm, S1, S2 normal, no murmur, click, rub or gallop Abdomen: abnormal findings:  moderate tenderness in the epigastrium and in the RUQ    UA-- + ketones and blood Assessment:    Abdominal pain, likely secondary to gerd vs GB .    Plan:    The diagnosis was discussed with the patient and evaluation and treatment plans outlined. See orders for lab and imaging studies. Adhere to simple, bland diet. Initiate empiric trial of acid suppression; see orders. Further follow-up plans will be based on outcome of lab/imaging studies; see orders. f/u GI

## 2013-12-07 NOTE — Telephone Encounter (Signed)
Spoke with patient and she wanted to have the medication mailed. Rx faxed and mailed    KP

## 2013-12-08 ENCOUNTER — Telehealth: Payer: Self-pay | Admitting: Family Medicine

## 2013-12-08 NOTE — Telephone Encounter (Signed)
To MD for FYI.       KP 

## 2013-12-08 NOTE — Telephone Encounter (Signed)
Relevant patient education assigned to patient using Emmi. ° °

## 2013-12-08 NOTE — Telephone Encounter (Signed)
Caller name: Gerald Stabs Relation to pt: PT for [facility] Call back number: [phone]   Reason for call:  Order to go out and evaluate pt, but pt was never home nor returned phone messges.  Physical Therapist will discharge order for evaluation.

## 2013-12-08 NOTE — Telephone Encounter (Signed)
MSG left to call the office      KP 

## 2013-12-09 ENCOUNTER — Ambulatory Visit: Payer: Medicare Other | Admitting: Family Medicine

## 2013-12-09 NOTE — Telephone Encounter (Signed)
noted 

## 2013-12-10 LAB — URINE CULTURE

## 2013-12-13 ENCOUNTER — Ambulatory Visit (HOSPITAL_BASED_OUTPATIENT_CLINIC_OR_DEPARTMENT_OTHER)
Admission: RE | Admit: 2013-12-13 | Discharge: 2013-12-13 | Disposition: A | Discharge status: Home / Self Care | Payer: Medicare Other | Source: Ambulatory Visit | Attending: Family Medicine | Admitting: Family Medicine

## 2013-12-13 ENCOUNTER — Other Ambulatory Visit: Payer: Self-pay | Admitting: Family Medicine

## 2013-12-13 ENCOUNTER — Telehealth: Payer: Self-pay | Admitting: Family Medicine

## 2013-12-13 DIAGNOSIS — K8020 Calculus of gallbladder without cholecystitis without obstruction: Secondary | ICD-10-CM | POA: Insufficient documentation

## 2013-12-13 DIAGNOSIS — K819 Cholecystitis, unspecified: Secondary | ICD-10-CM

## 2013-12-13 DIAGNOSIS — R109 Unspecified abdominal pain: Secondary | ICD-10-CM

## 2013-12-13 NOTE — Telephone Encounter (Signed)
Caller name: Daphnie Relation to PP:HKFE  Call back number:[phone]   Reason for call:   Pt received a call from [facility] and pt wants to know why?

## 2013-12-13 NOTE — Telephone Encounter (Signed)
Spoke with patient and she stated she is tired of the pain but she would rather talk to her Gi doctor before she has surgery. She also stated her Rx for Valtrex was 300 dollars and she would like to have a generic mailed to her, Please advise         KP

## 2013-12-13 NOTE — Telephone Encounter (Signed)
Zovirax 400 mg tid x 10 days  #30

## 2013-12-13 NOTE — Telephone Encounter (Signed)
Patient would like to see GI doctor for gallstone before she goes to the surgeon. Labs and [place] results have been faxed     KP

## 2013-12-14 MED ORDER — ACYCLOVIR 400 MG PO TABS: 400.0000 mg | ORAL_TABLET | Freq: Three times a day (TID) | ORAL | Status: DC

## 2013-12-14 NOTE — Telephone Encounter (Signed)
Rx mailed to the patient per her request.     KP

## 2013-12-16 ENCOUNTER — Encounter: Payer: Self-pay | Admitting: Internal Medicine

## 2013-12-23 ENCOUNTER — Telehealth: Payer: Self-pay | Admitting: *Deleted

## 2013-12-23 NOTE — Telephone Encounter (Signed)
Received Home Health Certification and POC paperwork via fax from [facility].  Billing sheet attached.  Placed in folder for Dr. Etter Sjogren to review and sign.//AB/CMA

## 2013-12-24 DIAGNOSIS — I10 Essential (primary) hypertension: Secondary | ICD-10-CM

## 2013-12-24 DIAGNOSIS — M810 Age-related osteoporosis without current pathological fracture: Secondary | ICD-10-CM

## 2013-12-24 DIAGNOSIS — M8448XD Pathological fracture, other site, subsequent encounter for fracture with routine healing: Secondary | ICD-10-CM

## 2013-12-29 NOTE — Telephone Encounter (Signed)
Received completed and signed [facility] forms from Dr. Etter Sjogren on 12/24/13.  All forms faxed to [facility] at [phone]).  Confirmation received.//AB/CMA

## 2014-01-03 ENCOUNTER — Ambulatory Visit (HOSPITAL_COMMUNITY)
Admission: RE | Admit: 2014-01-03 | Discharge: 2014-01-03 | Disposition: A | Discharge status: Home / Self Care | Payer: Medicare Other | Source: Ambulatory Visit | Attending: Interventional Radiology | Admitting: Interventional Radiology

## 2014-01-03 DIAGNOSIS — IMO0002 Reserved for concepts with insufficient information to code with codable children: Secondary | ICD-10-CM

## 2014-01-03 DIAGNOSIS — M549 Dorsalgia, unspecified: Secondary | ICD-10-CM

## 2014-01-05 ENCOUNTER — Other Ambulatory Visit (HOSPITAL_COMMUNITY): Payer: Self-pay | Admitting: Interventional Radiology

## 2014-01-05 DIAGNOSIS — T148XXA Other injury of unspecified body region, initial encounter: Secondary | ICD-10-CM

## 2014-01-05 DIAGNOSIS — M549 Dorsalgia, unspecified: Secondary | ICD-10-CM

## 2014-01-12 ENCOUNTER — Telehealth: Payer: Self-pay | Admitting: Family Medicine

## 2014-01-12 NOTE — Telephone Encounter (Signed)
Caller name: Maile  Call back number: [phone] Pharmacy:  [facility] [address] [place]  Reason for call:  Pt wants  A refill on Rx HYDROmorphone (DILAUDID) 4 MG tablet and a refill on Rx tiZANidine (ZANAFLEX) 4 MG

## 2014-01-13 ENCOUNTER — Ambulatory Visit (INDEPENDENT_AMBULATORY_CARE_PROVIDER_SITE_OTHER): Payer: Medicare Other | Admitting: Surgery

## 2014-01-13 MED ORDER — TIZANIDINE HCL 4 MG PO TABS: 4.0000 mg | ORAL_TABLET | Freq: Every evening | ORAL | Status: DC | PRN

## 2014-01-13 MED ORDER — HYDROMORPHONE HCL 4 MG PO TABS: 4.0000 mg | ORAL_TABLET | ORAL | Status: DC | PRN

## 2014-01-13 NOTE — Telephone Encounter (Signed)
Last seen 12/07/13 and filled 11/12/13 #60. No UDS or Contract  Please advise    KP

## 2014-01-13 NOTE — Telephone Encounter (Signed)
Patient aware that Rx is ready for pick up.     KP 

## 2014-01-13 NOTE — Telephone Encounter (Signed)
Refill x1 each 

## 2014-01-14 ENCOUNTER — Ambulatory Visit (HOSPITAL_COMMUNITY): Payer: Medicare Other

## 2014-01-14 ENCOUNTER — Encounter (HOSPITAL_COMMUNITY): Payer: Medicare Other

## 2014-01-19 ENCOUNTER — Encounter (HOSPITAL_COMMUNITY)
Admission: RE | Admit: 2014-01-19 | Discharge: 2014-01-19 | Disposition: A | Discharge status: Home / Self Care | Payer: Medicare Other | Source: Ambulatory Visit | Attending: Interventional Radiology | Admitting: Interventional Radiology

## 2014-01-19 DIAGNOSIS — Z87311 Personal history of (healed) other pathological fracture: Secondary | ICD-10-CM | POA: Diagnosis not present

## 2014-01-19 DIAGNOSIS — T148XXA Other injury of unspecified body region, initial encounter: Secondary | ICD-10-CM

## 2014-01-19 DIAGNOSIS — M549 Dorsalgia, unspecified: Secondary | ICD-10-CM | POA: Diagnosis not present

## 2014-01-19 MED ORDER — TECHNETIUM TC 99M MEDRONATE IV KIT
25.0000 | PACK | Freq: Once | INTRAVENOUS | Status: AC | PRN
  Administered 2014-01-19: 25 via INTRAVENOUS

## 2014-01-25 ENCOUNTER — Telehealth (HOSPITAL_COMMUNITY): Payer: Self-pay | Admitting: Interventional Radiology

## 2014-01-25 ENCOUNTER — Ambulatory Visit (INDEPENDENT_AMBULATORY_CARE_PROVIDER_SITE_OTHER): Payer: Medicare Other | Admitting: Surgery

## 2014-01-25 NOTE — Telephone Encounter (Signed)
Called pt, left VM for her to call to discuss NM bone scan JM

## 2014-01-26 ENCOUNTER — Telehealth (HOSPITAL_COMMUNITY): Payer: Self-pay | Admitting: Interventional Radiology

## 2014-01-26 NOTE — Telephone Encounter (Signed)
Pt returned my call concerning the results of her NM bone scan. Told her per Deveshwar no new fractures and questioned if she was still symptomatic. Patient states that she is feeling some better and will call us on a PRN basis. JM

## 2014-02-14 ENCOUNTER — Ambulatory Visit (INDEPENDENT_AMBULATORY_CARE_PROVIDER_SITE_OTHER): Payer: Medicare Other | Admitting: Family Medicine

## 2014-02-14 ENCOUNTER — Other Ambulatory Visit: Payer: Self-pay | Admitting: Family Medicine

## 2014-02-14 ENCOUNTER — Ambulatory Visit (HOSPITAL_BASED_OUTPATIENT_CLINIC_OR_DEPARTMENT_OTHER)
Admission: RE | Admit: 2014-02-14 | Discharge: 2014-02-14 | Disposition: A | Discharge status: Home / Self Care | Payer: Medicare Other | Source: Ambulatory Visit | Attending: Family Medicine | Admitting: Family Medicine

## 2014-02-14 ENCOUNTER — Encounter: Payer: Self-pay | Admitting: Family Medicine

## 2014-02-14 VITALS — BP 150/96 | HR 82 | Temp 97.7°F | Wt 123.4 lb

## 2014-02-14 DIAGNOSIS — R339 Retention of urine, unspecified: Secondary | ICD-10-CM

## 2014-02-14 DIAGNOSIS — I10 Essential (primary) hypertension: Secondary | ICD-10-CM

## 2014-02-14 DIAGNOSIS — R079 Chest pain, unspecified: Secondary | ICD-10-CM | POA: Insufficient documentation

## 2014-02-14 DIAGNOSIS — M545 Low back pain, unspecified: Secondary | ICD-10-CM | POA: Diagnosis present

## 2014-02-14 DIAGNOSIS — R0781 Pleurodynia: Secondary | ICD-10-CM

## 2014-02-14 DIAGNOSIS — M8448XA Pathological fracture, other site, initial encounter for fracture: Secondary | ICD-10-CM | POA: Diagnosis not present

## 2014-02-14 DIAGNOSIS — M5442 Lumbago with sciatica, left side: Secondary | ICD-10-CM

## 2014-02-14 DIAGNOSIS — Z950 Presence of cardiac pacemaker: Secondary | ICD-10-CM | POA: Insufficient documentation

## 2014-02-14 LAB — POCT URINALYSIS DIPSTICK
Bilirubin, UA: NEGATIVE
Blood, UA: NEGATIVE
Glucose, UA: NEGATIVE
Ketones, UA: NEGATIVE
Leukocytes, UA: NEGATIVE
Nitrite, UA: NEGATIVE
PROTEIN UA: NEGATIVE
Spec Grav, UA: <=1.005
UROBILINOGEN UA: 0.2
pH, UA: 7.5

## 2014-02-14 MED ORDER — LISINOPRIL-HYDROCHLOROTHIAZIDE 20-12.5 MG PO TABS: 2.0000 | ORAL_TABLET | Freq: Every day | ORAL | Status: DC

## 2014-02-14 MED ORDER — HYDROMORPHONE HCL 4 MG PO TABS: 4.0000 mg | ORAL_TABLET | ORAL | Status: DC | PRN

## 2014-02-14 NOTE — Patient Instructions (Signed)
Back Pain, Adult Low back pain is very common. About 1 in 5 people have back pain.The cause of low back pain is rarely dangerous. The pain often gets better over time.About half of people with a sudden onset of back pain feel better in just 2 weeks. About 8 in 10 people feel better by 6 weeks.  CAUSES Some common causes of back pain include:  Strain of the muscles or ligaments supporting the spine.  Wear and tear (degeneration) of the spinal discs.  Arthritis.  Direct injury to the back. DIAGNOSIS Most of the time, the direct cause of low back pain is not known.However, back pain can be treated effectively even when the exact cause of the pain is unknown.Answering your caregiver's questions about your overall health and symptoms is one of the most accurate ways to make sure the cause of your pain is not dangerous. If your caregiver needs more information, he or she may order lab work or imaging tests (X-rays or MRIs).However, even if imaging tests show changes in your back, this usually does not require surgery. HOME CARE INSTRUCTIONS For many people, back pain returns.Since low back pain is rarely dangerous, it is often a condition that people can learn to manageon their own.   Remain active. It is stressful on the back to sit or stand in one place. Do not sit, drive, or stand in one place for more than 30 minutes at a time. Take short walks on level surfaces as soon as pain allows.Try to increase the length of time you walk each day.  Do not stay in bed.Resting more than 1 or 2 days can delay your recovery.  Do not avoid exercise or work.Your body is made to move.It is not dangerous to be active, even though your back may hurt.Your back will likely heal faster if you return to being active before your pain is gone.  Pay attention to your body when you bend and lift. Many people have less discomfortwhen lifting if they bend their knees, keep the load close to their bodies,and  avoid twisting. Often, the most comfortable positions are those that put less stress on your recovering back.  Find a comfortable position to sleep. Use a firm mattress and lie on your side with your knees slightly bent. If you lie on your back, put a pillow under your knees.  Only take over-the-counter or prescription medicines as directed by your caregiver. Over-the-counter medicines to reduce pain and inflammation are often the most helpful.Your caregiver may prescribe muscle relaxant drugs.These medicines help dull your pain so you can more quickly return to your normal activities and healthy exercise.  Put ice on the injured area.  Put ice in a plastic bag.  Place a towel between your skin and the bag.  Leave the ice on for 15-20 minutes, 03-04 times a day for the first 2 to 3 days. After that, ice and heat may be alternated to reduce pain and spasms.  Ask your caregiver about trying back exercises and gentle massage. This may be of some benefit.  Avoid feeling anxious or stressed.Stress increases muscle tension and can worsen back pain.It is important to recognize when you are anxious or stressed and learn ways to manage it.Exercise is a great option. SEEK MEDICAL CARE IF:  You have pain that is not relieved with rest or medicine.  You have pain that does not improve in 1 week.  You have new symptoms.  You are generally not feeling well. SEEK   IMMEDIATE MEDICAL CARE IF:   You have pain that radiates from your back into your legs.  You develop new bowel or bladder control problems.  You have unusual weakness or numbness in your arms or legs.  You develop nausea or vomiting.  You develop abdominal pain.  You feel faint. Document Released: 06/17/2005 Document Revised: 12/17/2011 Document Reviewed: 10/19/2013 [employer] Patient Information 2015 [employer]. This information is not intended to replace advice given to you by your health care provider. Make sure you  discuss any questions you have with your health care provider.  

## 2014-02-14 NOTE — Progress Notes (Signed)
   Subjective:    Patient ID: [name], female    DOB: [birth year], 73 y.o.   MRN: [id number]  HPI Pt is here c/o con't back pain with L rib pain and pain in buttocks with decreased urination.  No known new injury.   Pt states no one has gone over her bone scan with her and she is asking for the results.  Pt also requesting handicap stick for car.     Review of Systems As above    Objective:   Physical Exam BP 150/96  Pulse 82  Temp(Src) 97.7 F (36.5 C) (Oral)  Wt 123 lb 6.4 oz (55.974 kg)  SpO2 98% General appearance: alert, cooperative, appears stated age and no distress Throat: lips, mucosa, and tongue normal; teeth and gums normal Neck: no adenopathy, no carotid bruit, no JVD, supple, symmetrical, trachea midline and thyroid not enlarged, symmetric, no tenderness/mass/nodules Lungs: clear to auscultation bilaterally Heart: S1, S2 normal Extremities: extremities normal, atraumatic, no cyanosis or edema        Assessment & Plan:  1. Urine retention If worsens rto or go to ER if we are not here   - POCT Urinalysis Dipstick  2. Low back pain with radiation, left Check xrays-- consider MRI vs ns referral - DG Lumbar Spine 2-3 Views; Future - HYDROmorphone (DILAUDID) 4 MG tablet; Take 1 tablet (4 mg total) by mouth every 4 (four) hours as needed for severe pain.  Dispense: 90 tablet; Refill: 0  3. Rib pain on left side Reviewed last bone scan-- check xrays - HYDROmorphone (DILAUDID) 4 MG tablet; Take 1 tablet (4 mg total) by mouth every 4 (four) hours as needed for severe pain.  Dispense: 90 tablet; Refill: 0  4. Essential hypertension Elevated today-- dose increased - lisinopril-hydrochlorothiazide (ZESTORETIC) 20-12.5 MG per tablet; Take 2 tablets by mouth daily.  Dispense: 180 tablet; Refill: 3

## 2014-02-14 NOTE — Progress Notes (Signed)
Pre visit review using our clinic review tool, if applicable. No additional management support is needed unless otherwise documented below in the visit note. 

## 2014-02-16 ENCOUNTER — Telehealth: Payer: Self-pay | Admitting: Family Medicine

## 2014-02-16 DIAGNOSIS — IMO0002 Reserved for concepts with insufficient information to code with codable children: Secondary | ICD-10-CM

## 2014-02-16 NOTE — Telephone Encounter (Signed)
Caller name: Litzi Relation to pt: Call back number: [phone] Pharmacy: [facility] [address] [place]  Reason for call:  Pt read results of xray from 8/17 and would like to speak with Dr. Etter Sjogren or CMA on what to do next.  Thanks.    Pt wants a refill on rx tiZANidine (ZANAFLEX) 4 MG  too

## 2014-02-16 NOTE — Telephone Encounter (Signed)
Entered by Rosalita Chessman, DO at 02/14/2014 4:36 PM Read by Francesco Runner at 02/14/2014 7:45 PM  Nothing definite seen on xray-- check ct if pain persists   Read by Francesco Runner at 02/15/2014 3:30 PM  New compression fracture--- pt needs f/u with Dr Roena Malady  Or we can send her to ortho if she would like   Msg left for the patient to call the office      KP

## 2014-02-17 NOTE — Telephone Encounter (Signed)
Pt states she would rather be seen by orthopedic.  Orthopedic referral placed by Dr. Nonda Lou verbal order.  Pt is aware.

## 2014-02-18 ENCOUNTER — Telehealth: Payer: Self-pay | Admitting: Family Medicine

## 2014-02-18 NOTE — Telephone Encounter (Signed)
Caller name: Nabila  Call back number:[phone]   Reason for call:  Pt wants a call back from dr. Etter Sjogren only.  The questions she has aren't getting answered.

## 2014-02-18 NOTE — Telephone Encounter (Signed)
Patient did not return the call. 

## 2014-02-18 NOTE — Telephone Encounter (Signed)
Had to leave msg--- requested that when pt called back she please talk to one of the nurses

## 2014-03-04 ENCOUNTER — Emergency Department (HOSPITAL_BASED_OUTPATIENT_CLINIC_OR_DEPARTMENT_OTHER): Payer: Medicare Other

## 2014-03-04 ENCOUNTER — Encounter (HOSPITAL_BASED_OUTPATIENT_CLINIC_OR_DEPARTMENT_OTHER): Payer: Self-pay | Admitting: Emergency Medicine

## 2014-03-04 ENCOUNTER — Emergency Department (HOSPITAL_BASED_OUTPATIENT_CLINIC_OR_DEPARTMENT_OTHER)
Admission: EM | Admit: 2014-03-04 | Discharge: 2014-03-04 | Disposition: A | Discharge status: Home / Self Care | Payer: Medicare Other | Attending: Emergency Medicine | Admitting: Emergency Medicine

## 2014-03-04 DIAGNOSIS — E039 Hypothyroidism, unspecified: Secondary | ICD-10-CM | POA: Diagnosis not present

## 2014-03-04 DIAGNOSIS — R209 Unspecified disturbances of skin sensation: Secondary | ICD-10-CM | POA: Diagnosis present

## 2014-03-04 DIAGNOSIS — R599 Enlarged lymph nodes, unspecified: Secondary | ICD-10-CM | POA: Diagnosis not present

## 2014-03-04 DIAGNOSIS — I10 Essential (primary) hypertension: Secondary | ICD-10-CM | POA: Insufficient documentation

## 2014-03-04 DIAGNOSIS — Z8669 Personal history of other diseases of the nervous system and sense organs: Secondary | ICD-10-CM | POA: Diagnosis not present

## 2014-03-04 DIAGNOSIS — Z8659 Personal history of other mental and behavioral disorders: Secondary | ICD-10-CM | POA: Diagnosis not present

## 2014-03-04 DIAGNOSIS — I509 Heart failure, unspecified: Secondary | ICD-10-CM | POA: Diagnosis not present

## 2014-03-04 DIAGNOSIS — Z872 Personal history of diseases of the skin and subcutaneous tissue: Secondary | ICD-10-CM | POA: Diagnosis not present

## 2014-03-04 DIAGNOSIS — Z950 Presence of cardiac pacemaker: Secondary | ICD-10-CM | POA: Diagnosis not present

## 2014-03-04 DIAGNOSIS — K219 Gastro-esophageal reflux disease without esophagitis: Secondary | ICD-10-CM | POA: Diagnosis not present

## 2014-03-04 DIAGNOSIS — R202 Paresthesia of skin: Secondary | ICD-10-CM

## 2014-03-04 DIAGNOSIS — Z79899 Other long term (current) drug therapy: Secondary | ICD-10-CM | POA: Insufficient documentation

## 2014-03-04 DIAGNOSIS — Z7982 Long term (current) use of aspirin: Secondary | ICD-10-CM | POA: Diagnosis not present

## 2014-03-04 DIAGNOSIS — Z8709 Personal history of other diseases of the respiratory system: Secondary | ICD-10-CM | POA: Diagnosis not present

## 2014-03-04 DIAGNOSIS — Z8739 Personal history of other diseases of the musculoskeletal system and connective tissue: Secondary | ICD-10-CM | POA: Insufficient documentation

## 2014-03-04 DIAGNOSIS — R590 Localized enlarged lymph nodes: Secondary | ICD-10-CM

## 2014-03-04 LAB — CBC WITH DIFFERENTIAL/PLATELET
BASOS PCT: 0 % (ref 0–1)
Basophils Absolute: 0 10*3/uL (ref 0.0–0.1)
Eosinophils Absolute: 0 10*3/uL (ref 0.0–0.7)
Eosinophils Relative: 1 % (ref 0–5)
HCT: 37.8 % (ref 36.0–46.0)
Hemoglobin: 12.5 g/dL (ref 12.0–15.0)
Lymphocytes Relative: 20 % (ref 12–46)
Lymphs Abs: 0.9 10*3/uL (ref 0.7–4.0)
MCH: 27.4 pg (ref 26.0–34.0)
MCHC: 33.1 g/dL (ref 30.0–36.0)
MCV: 82.7 fL (ref 78.0–100.0)
MONO ABS: 0.4 10*3/uL (ref 0.1–1.0)
Monocytes Relative: 10 % (ref 3–12)
NEUTROS ABS: 3.1 10*3/uL (ref 1.7–7.7)
NEUTROS PCT: 69 % (ref 43–77)
PLATELETS: 137 10*3/uL — AB (ref 150–400)
RBC: 4.57 MIL/uL (ref 3.87–5.11)
RDW: 13.1 % (ref 11.5–15.5)
WBC: 4.4 10*3/uL (ref 4.0–10.5)

## 2014-03-04 LAB — BASIC METABOLIC PANEL
ANION GAP: 15 (ref 5–15)
BUN: 14 mg/dL (ref 6–23)
CO2: 26 mEq/L (ref 19–32)
CREATININE: 0.7 mg/dL (ref 0.50–1.10)
Calcium: 10.3 mg/dL (ref 8.4–10.5)
Chloride: 98 mEq/L (ref 96–112)
GFR calc Af Amer: >90 mL/min (ref >90)
GFR calc non Af Amer: 83 mL/min — ABNORMAL LOW (ref >90)
Glucose, Bld: 98 mg/dL (ref 70–99)
POTASSIUM: 3.9 meq/L (ref 3.7–5.3)
Sodium: 139 mEq/L (ref 137–147)

## 2014-03-04 MED ORDER — ASPIRIN 81 MG PO CHEW: 81.0000 mg | CHEWABLE_TABLET | Freq: Every day | ORAL | Status: DC

## 2014-03-04 NOTE — Discharge Instructions (Signed)

## 2014-03-04 NOTE — ED Notes (Signed)
Pt states she awoke this morning with numbness to the left side of her face. Speech clear, moe + x 4 ext, pt states she has several compression fx and has had ongoing numbness and tingling in both feet, but nothing new today. Denies ha or any other c/o.

## 2014-03-04 NOTE — ED Notes (Signed)
Patient transported to CT 

## 2014-03-04 NOTE — ED Provider Notes (Signed)
CSN: [id number]     Arrival date & time 03/04/14  72 History   First MD Initiated Contact with Patient 03/04/14 1108     Chief Complaint  Patient presents with  . Numbness     (Consider location/radiation/quality/duration/timing/severity/associated sxs/prior Treatment) HPI  This is a 73 year old female who presents with left facial tingling. Patient states that she woke up this morning approximately 6:30 and had onset of symptoms. She denies any difficulty with speech, weakness, vision changes elsewhere. She denies any pain. Patient has also noted a swelling over her left neck which is also new.  She denies any recent fevers or other symptoms. She has been angulatory. She does have a history of persistent numbness in her bilateral feet following known compression fractures in her back. Back pain is at baseline. She denies any difficulty with her bowel or bladder.  Past Medical History  Diagnosis Date  . Atrioventricular block, complete     a. 08/2011 Upgrage of PPM to MDT Adapta L Dual Chamber PPM ser # [id number] H.  . Diastolic dysfunction   . HTN (hypertension)   . Acute neck pain   . Tension headache   . Multinodular goiter   . Contact dermatitis and eczema     unspec cause  . Tooth abscess   . Thyroid nodule   . Osteoporosis   . GERD (gastroesophageal reflux disease)   . Depression   . Lung nodule   . Superficial thrombophlebitis   . Hypothyroidism   . Acute sinusitis   . Mitral regurgitation     a.03/2012 Echo: EF 65-70%, mod MR  . Chest pain     a. 09/2007 Myoview: EF 77%, no ischemia/infarct.  [name] PONV (postoperative nausea and vomiting)   . Pacemaker   . CHF (congestive heart failure)    Past Surgical History  Procedure Laterality Date  . Laporatomy for ruptured diverticulum with colostomy  2003  . Take down of colostomy  2004  . Exploratory laparotomy    . Medtronic pacemaker  05/2003  . Hernia repair    . Colon surgery    . Thyroidectomy N/A 08/07/2012   Procedure: THYROIDECTOMY;  Surgeon: Earnstine Regal, MD;  Location: [facility];  Service: General;  Laterality: N/A;  . Colonoscopy  01/19/2013    colonoscopy   Family History  Problem Relation Age of Onset  . Heart disease Mother   . Heart failure Father   . Diverticulosis Sister   . Hypertension Brother   . Hypertension Brother   . Heart disease Brother   . Cancer Son     breast   History  Substance Use Topics  . Smoking status: Former Smoker    Quit date: 10/06/1963  . Smokeless tobacco: Never Used     Comment: quit 30 years ago  . Alcohol Use: No   OB History   Grav Para Term Preterm Abortions TAB SAB Ect Mult Living                 Review of Systems  Constitutional: Negative for fever.  HENT: Negative for dental problem, ear pain and sore throat.        Left neck swelling  Respiratory: Negative for cough, chest tightness and shortness of breath.   Cardiovascular: Negative for chest pain.  Gastrointestinal: Negative for nausea, vomiting and abdominal pain.  Genitourinary: Negative for dysuria.  Musculoskeletal: Negative for back pain.  Skin: Negative for wound.  Neurological: Negative for dizziness and headaches.  Left face tingling  Psychiatric/Behavioral: Negative for confusion.  All other systems reviewed and are negative.     Allergies  Tape and Codeine  Home Medications   Prior to Admission medications   Medication Sig Start Date End Date Taking? Authorizing Provider  acetaminophen (TYLENOL) 500 MG tablet Take 1,000 mg by mouth 2 (two) times daily as needed (pain).    Historical Provider, MD  aspirin 81 MG chewable tablet Chew 1 tablet (81 mg total) by mouth daily. 03/04/14   Merryl Hacker, MD  calcium carbonate (OS-CAL - DOSED IN MG OF ELEMENTAL CALCIUM) 1250 MG tablet Take 500 mg by mouth 3 (three) times daily. 08/08/12   Armandina Gemma, MD  Chlorpheniramine-APAP (CORICIDIN) 2-325 MG TABS Take 2 tablets by mouth 2 (two) times daily as needed (allergies).     Historical Provider, MD  denosumab (PROLIA) 60 MG/ML SOLN Inject 60 mg into the skin every 6 (six) months. Next injection scheduled for Tuesday, May 12th    Historical Provider, MD  diphenhydramine-acetaminophen (TYLENOL PM) 25-500 MG TABS Take 2 tablets by mouth at bedtime as needed (sleep).     Historical Provider, MD  HYDROmorphone (DILAUDID) 4 MG tablet Take 1 tablet (4 mg total) by mouth every 4 (four) hours as needed for severe pain. 02/14/14   Rosalita Chessman, DO  levothyroxine (SYNTHROID, LEVOTHROID) 88 MCG tablet Take 1 tablet (88 mcg total) by mouth daily before breakfast. 07/14/13   Neena Rhymes, MD  lisinopril-hydrochlorothiazide (ZESTORETIC) 20-12.5 MG per tablet Take 2 tablets by mouth daily. 02/14/14   Rosalita Chessman, DO  Multiple Vitamin (MULTIVITAMIN WITH MINERALS) TABS tablet Take 1 tablet by mouth daily.    Historical Provider, MD  omeprazole (PRILOSEC) 20 MG capsule Take 1 capsule (20 mg total) by mouth 2 (two) times daily before a meal. 12/07/13   Rosalita Chessman, DO  tiZANidine (ZANAFLEX) 4 MG tablet Take 1 tablet (4 mg total) by mouth at bedtime as needed for muscle spasms. 01/13/14   Alferd Apa Lowne, DO   BP 170/90  Pulse 85  Temp(Src) 98 F (36.7 C) (Oral)  Resp 18  Ht 5' 5.5" (1.664 m)  Wt 122 lb (55.339 kg)  BMI 19.99 kg/m2  SpO2 97% Physical Exam  Nursing note and vitals reviewed. Constitutional: She is oriented to person, place, and time. She appears well-developed and well-nourished. No distress.  HENT:  Head: Normocephalic and atraumatic.  Right Ear: External ear normal.  Left Ear: External ear normal.  Mouth/Throat: Oropharynx is clear and moist.  Eyes: Pupils are equal, round, and reactive to light.  Neck: Neck supple.  Mobile 1 cm left anterior just below the mandible, no tenderness to palpation  Cardiovascular: Normal rate, regular rhythm and normal heart sounds.   No murmur heard. Pulmonary/Chest: Effort normal and breath sounds normal. No respiratory  distress. She has no wheezes.  Placer palpated in left chest wall  Abdominal: Soft. Bowel sounds are normal. There is no tenderness. There is no rebound.  Musculoskeletal: She exhibits no edema.  Lymphadenopathy:    She has cervical adenopathy.  Neurological: She is alert and oriented to person, place, and time.  Cranial nerves II through XII intact, and sensation intact to light touch to the face, 5 out of 5 strength in all 4 extremities, no dysmetria to finger-nose-finger, no drift noted  Skin: Skin is warm and dry.  Psychiatric: She has a normal mood and affect.    ED Course  Procedures (including critical care  time) Labs Review Labs Reviewed  CBC WITH DIFFERENTIAL - Abnormal; Notable for the following:    Platelets 137 (*)    All other components within normal limits  BASIC METABOLIC PANEL - Abnormal; Notable for the following:    GFR calc non Af Amer 83 (*)    All other components within normal limits    Imaging Review No results found.  CLINICAL DATA: Left-sided facial numbness.  EXAM: CT HEAD WITHOUT CONTRAST  TECHNIQUE: Contiguous axial images were obtained from the base of the skull through the vertex without intravenous contrast.  COMPARISON: None.  FINDINGS: There is no intra or extra-axial fluid collection or mass lesion. The basilar cisterns and ventricles have a normal appearance. There is no CT evidence for acute infarction or hemorrhage. Mastoid air cells and paranasal sinuses are unremarkable. Calvarium intact.  IMPRESSION: No acute intracranial process   Electronically Signed By: Lovey Newcomer M.D. On: 03/04/2014 12:04    EKG Interpretation None      MDM   Final diagnoses:  Facial paresthesia  Anterior cervical adenopathy    Patient presents with Levada Dy lesion tingling sensation to the left face. She is otherwise neurologically intact. There is no objective neurologic deficit on exam including sensory deficit.  She does have a prominent  left cervical lymph node which is new and she associates them with her current symptoms. There is no evidence of Bell's palsy.  Considerations include paresthesia, early trigeminal nerve or seizures her pain, and straight. This time patient cannot obtain an MRI given pacemaker placement. We'll obtain screening CT scan.  Patient's symptoms have resolved.  CT scan is reassuring and basic labwork is also reassuring.  While TIA is a consideration, patient's description of tingling with intact sensation on exam is reassuring. Do not feel patient needs admission for her to her workup. Silva Bandy can be done as an outpatient. Discussed with patient addition of aspirin to her daily medication regimen. Will have her follow up closely with her primary care physician and neurology. Patient was given strict return precautions including new onset weakness, numbness, tingling, speech difficulty or any other new or concerning symptoms. Patient and her daughter stated understanding.  After history, exam, and medical workup I feel the patient has been appropriately medically screened and is safe for discharge home. Pertinent diagnoses were discussed with the patient. Patient was given return precautions.     Merryl Hacker, MD 03/04/14 [phone]

## 2014-03-10 ENCOUNTER — Encounter: Payer: Medicare Other | Admitting: Internal Medicine

## 2014-03-22 ENCOUNTER — Telehealth: Payer: Self-pay | Admitting: Family Medicine

## 2014-03-22 DIAGNOSIS — R0781 Pleurodynia: Secondary | ICD-10-CM

## 2014-03-22 DIAGNOSIS — M5442 Lumbago with sciatica, left side: Secondary | ICD-10-CM

## 2014-03-22 MED ORDER — HYDROMORPHONE HCL 4 MG PO TABS: 4.0000 mg | ORAL_TABLET | ORAL | Status: DC | PRN

## 2014-03-22 NOTE — Telephone Encounter (Signed)
VM left advising Rx ready for pick up.     KP 

## 2014-03-22 NOTE — Telephone Encounter (Signed)
Refill x1 

## 2014-03-22 NOTE — Telephone Encounter (Signed)
Last seen and filled 02/14/14 #90. Please advise     KP

## 2014-03-22 NOTE — Telephone Encounter (Signed)
Caller name: Delvina, Mizzell M Relation to pt: self  Call back number: [phone]   Reason for call: pt requesting a refill HYDROmorphone (DILAUDID) 4 MG tablet.

## 2014-03-24 ENCOUNTER — Ambulatory Visit (INDEPENDENT_AMBULATORY_CARE_PROVIDER_SITE_OTHER): Payer: Medicare Other

## 2014-03-24 DIAGNOSIS — Z23 Encounter for immunization: Secondary | ICD-10-CM

## 2014-04-04 ENCOUNTER — Ambulatory Visit (INDEPENDENT_AMBULATORY_CARE_PROVIDER_SITE_OTHER): Payer: Medicare Other | Admitting: Internal Medicine

## 2014-04-04 ENCOUNTER — Encounter: Payer: Self-pay | Admitting: Internal Medicine

## 2014-04-04 VITALS — BP 180/96 | HR 83 | Ht 65.0 in | Wt 123.6 lb

## 2014-04-04 DIAGNOSIS — I10 Essential (primary) hypertension: Secondary | ICD-10-CM

## 2014-04-04 DIAGNOSIS — Z950 Presence of cardiac pacemaker: Secondary | ICD-10-CM

## 2014-04-04 DIAGNOSIS — I442 Atrioventricular block, complete: Secondary | ICD-10-CM

## 2014-04-04 LAB — MDC_IDC_ENUM_SESS_TYPE_INCLINIC
Battery Impedance: 157 Ohm
Brady Statistic AP VS Percent: 0 %
Brady Statistic AS VS Percent: 0 %
Date Time Interrogation Session: 20151005115232
Lead Channel Impedance Value: 559 Ohm
Lead Channel Pacing Threshold Amplitude: 0.5 V
Lead Channel Pacing Threshold Amplitude: 0.5 V
Lead Channel Pacing Threshold Pulse Width: 0.4 ms
Lead Channel Pacing Threshold Pulse Width: 0.4 ms
Lead Channel Sensing Intrinsic Amplitude: 4 mV
Lead Channel Setting Pacing Amplitude: 2.5 V
Lead Channel Setting Pacing Pulse Width: 0.4 ms
Lead Channel Setting Sensing Sensitivity: 2.8 mV
MDC IDC MSMT BATTERY REMAINING LONGEVITY: 126 mo
MDC IDC MSMT BATTERY VOLTAGE: 2.78 V
MDC IDC MSMT LEADCHNL RV IMPEDANCE VALUE: 620 Ohm
MDC IDC SET LEADCHNL RA PACING AMPLITUDE: 2 V
MDC IDC STAT BRADY AP VP PERCENT: 3 %
MDC IDC STAT BRADY AS VP PERCENT: 97 %

## 2014-04-04 MED ORDER — AMLODIPINE BESYLATE 5 MG PO TABS: 5.0000 mg | ORAL_TABLET | Freq: Every day | ORAL | Status: DC

## 2014-04-04 NOTE — Patient Instructions (Addendum)
Your physician has recommended you make the following change in your medication:  1) START Amlodipine 5 mg daily  Your physician recommends that you schedule a follow-up appointment in: 2 months with a PA/NP for blood pressure follow up  Your physician has recommended that you have a home sleep study. This test records several body functions during sleep, including: brain activity, eye movement, oxygen and carbon dioxide blood levels, heart rate and rhythm, breathing rate and rhythm, the flow of air through your mouth and nose, snoring, body muscle movements, and chest and belly movement.   Your physician recommends that you schedule a follow-up appointment in: 6 months with the [facility] and in 12 months with Dr.Klein

## 2014-04-04 NOTE — Progress Notes (Signed)
Patient Care Team: Rosalita Chessman, DO as PCP - General (Family Medicine)   HPI  [name] is a 73 y.o. female Seen in followup for a pacemaker implanted for high-grade heart block which reached ERI 3/13 at that time underwent generator replacement.  She is feeling well ; she has had problems with her back and has had no kyphoplasty procedures  Her blood pressures at home have been running high.  Renal function Cr 0.7  9/15    Past Medical History  Diagnosis Date  . Atrioventricular block, complete     a. 08/2011 Upgrage of PPM to MDT Adapta L Dual Chamber PPM ser # [id number] H.  . Diastolic dysfunction   . HTN (hypertension)   . Acute neck pain   . Tension headache   . Multinodular goiter   . Contact dermatitis and eczema     unspec cause  . Tooth abscess   . Thyroid nodule   . Osteoporosis   . GERD (gastroesophageal reflux disease)   . Depression   . Lung nodule   . Superficial thrombophlebitis   . Hypothyroidism   . Acute sinusitis   . Mitral regurgitation     a.03/2012 Echo: EF 65-70%, mod MR  . Chest pain     a. 09/2007 Myoview: EF 77%, no ischemia/infarct.  [name] PONV (postoperative nausea and vomiting)   . Pacemaker   . CHF (congestive heart failure)     Past Surgical History  Procedure Laterality Date  . Laporatomy for ruptured diverticulum with colostomy  2003  . Take down of colostomy  2004  . Exploratory laparotomy    . Medtronic pacemaker  05/2003  . Hernia repair    . Colon surgery    . Thyroidectomy N/A 08/07/2012    Procedure: THYROIDECTOMY;  Surgeon: Earnstine Regal, MD;  Location: [facility];  Service: General;  Laterality: N/A;  . Colonoscopy  01/19/2013    colonoscopy    Current Outpatient Prescriptions  Medication Sig Dispense Refill  . acetaminophen (TYLENOL) 500 MG tablet Take 1,000 mg by mouth 2 (two) times daily as needed (pain).      [name] aspirin 81 MG chewable tablet Chew 1 tablet (81 mg total) by mouth daily.  30 tablet  0  . calcium  carbonate (OS-CAL - DOSED IN MG OF ELEMENTAL CALCIUM) 1250 MG tablet Take 500 mg by mouth 3 (three) times daily.      . Chlorpheniramine-APAP (CORICIDIN) 2-325 MG TABS Take 2 tablets by mouth 2 (two) times daily as needed (allergies).      [name] denosumab (PROLIA) 60 MG/ML SOLN Inject 60 mg into the skin every 6 (six) months. Next injection scheduled for Tuesday, May 12th      . diphenhydramine-acetaminophen (TYLENOL PM) 25-500 MG TABS Take 2 tablets by mouth at bedtime as needed (sleep).       [name] HYDROmorphone (DILAUDID) 4 MG tablet Take 1 tablet (4 mg total) by mouth every 4 (four) hours as needed for severe pain.  90 tablet  0  . levothyroxine (SYNTHROID, LEVOTHROID) 88 MCG tablet Take 1 tablet (88 mcg total) by mouth daily before breakfast.  90 tablet  3  . lisinopril-hydrochlorothiazide (ZESTORETIC) 20-12.5 MG per tablet Take 2 tablets by mouth daily.  180 tablet  3  . Multiple Vitamin (MULTIVITAMIN WITH MINERALS) TABS tablet Take 1 tablet by mouth daily.      [name] omeprazole (PRILOSEC) 20 MG capsule Take 1 capsule (20 mg total) by mouth 2 (two) times daily  before a meal.  60 capsule  5  . tiZANidine (ZANAFLEX) 4 MG tablet Take 1 tablet (4 mg total) by mouth at bedtime as needed for muscle spasms.  30 tablet  1  . [DISCONTINUED] famotidine (PEPCID) 20 MG tablet Take 20 mg by mouth as needed.        No current facility-administered medications for this visit.    Allergies  Allergen Reactions  . Tape Rash  . Codeine Nausea And Vomiting    Review of Systems negative except from HPI and PMH  Physical Exam BP 180/96  Pulse 83  Ht 5\' 5"  (1.651 m)  Wt 123 lb 9.6 oz (56.065 kg)  BMI 20.57 kg/m2 Well developed and well nourished in no acute distress HENT normal E scleral and icterus clear Neck Supple; scar JVP flat; carotids brisk and full Clear to ausculation Device pocket well healed; without hematoma or erythema Regular rate and rhythm, no murmurs gallops or rub Soft with active bowel  sounds No clubbing cyanosis none Edema Alert and oriented, grossly normal motor and sensory function Skin Warm and Dry    Assessment and  Plan  Complete heart block   hypertension  Pacemaker-Medtronic  The patient's device was interrogated.  The information was reviewed. No changes were made in the programming.     Her blood pressure significantly elevated. We reviewed the importance of trying to get it down; we'll add amlodipine and have reviewed the side effects.  Heart rhythm issues are stable.

## 2014-04-04 NOTE — Progress Notes (Signed)
Patient called back asking [place] to send rx to [facility].

## 2014-04-04 NOTE — Addendum Note (Signed)
Addended by: Trinidad Curet L on: 04/04/2014 10:00 AM   Modules accepted: Orders

## 2014-04-13 ENCOUNTER — Encounter: Payer: Self-pay | Admitting: Internal Medicine

## 2014-04-13 ENCOUNTER — Telehealth: Payer: Self-pay | Admitting: Internal Medicine

## 2014-04-13 NOTE — Telephone Encounter (Signed)
Caller name: Lindley Stachnik. Relation to pt: Call back number: [phone] Pharmacy:  Reason for call: Pt needs to consult about a prescription called Forteo. Please advise.

## 2014-04-13 NOTE — Telephone Encounter (Signed)
Informed patient I would ask him tomorrow. She is agreeable to this.

## 2014-04-13 NOTE — Telephone Encounter (Signed)
New message     Did Dr Caryl Comes ever get a referral to a PCP for her?

## 2014-04-15 NOTE — Telephone Encounter (Signed)
Informed patient that Dr. Caryl Comes only knows one doctor in [facility] area. Patient given info on Dr. Vista Lawman at [place]. She is thankful for information.

## 2014-05-10 ENCOUNTER — Telehealth: Payer: Self-pay | Admitting: Family Medicine

## 2014-05-10 DIAGNOSIS — M5442 Lumbago with sciatica, left side: Secondary | ICD-10-CM

## 2014-05-10 DIAGNOSIS — R0781 Pleurodynia: Secondary | ICD-10-CM

## 2014-05-10 MED ORDER — HYDROMORPHONE HCL 4 MG PO TABS: 4.0000 mg | ORAL_TABLET | ORAL | Status: DC | PRN

## 2014-05-10 NOTE — Telephone Encounter (Signed)
Last seen 02/14/14 and filled 03/22/14 #90 No UDS   Please advise    KP

## 2014-05-10 NOTE — Telephone Encounter (Signed)
No more pain meds from [place]-- pt sees Dr Nelva Bush and ortho

## 2014-05-10 NOTE — Telephone Encounter (Signed)
The Orthopedic's 03/21/14 notes advised to wean the patient off of the narcotic due to her age and balance issues. He is not prescribing the Rx. Per Dr.Lowne we will wean the patient off of the Dilaudid and give her a quantity to #60. I tried calling the patient to discuss. Message left to call the office     KP

## 2014-05-10 NOTE — Telephone Encounter (Signed)
Caller name: Sonika Relation to pt: self Call back number: [phone] Pharmacy:  Reason for call:   Patient is requesting a new rx of dilaudid

## 2014-05-10 NOTE — Telephone Encounter (Signed)
Pt was advised from as per PCP instructions

## 2014-05-10 NOTE — Telephone Encounter (Signed)
Rx printed and put in red folder for signature.      KP

## 2014-05-25 ENCOUNTER — Telehealth: Payer: Self-pay | Admitting: *Deleted

## 2014-05-25 NOTE — Telephone Encounter (Signed)
Informed patient home sleep study results were mild. RDI 9.1 Explained nothing to be done at this time.

## 2014-05-30 ENCOUNTER — Telehealth: Payer: Self-pay | Admitting: Family Medicine

## 2014-05-30 NOTE — Telephone Encounter (Signed)
LVM to schedule Prolia appointment

## 2014-06-01 ENCOUNTER — Telehealth: Payer: Self-pay | Admitting: Family Medicine

## 2014-06-01 NOTE — Telephone Encounter (Signed)
Caller name: Nolie, Bignell M Relation to pt: self  Call back number: [phone] Pharmacy:  Reason for call:  Pt would like to know if the prolia shot can wait until mid January because she is out of town. Please advise

## 2014-06-02 NOTE — Telephone Encounter (Signed)
No problem.     KP

## 2014-06-02 NOTE — Telephone Encounter (Signed)
Scheduled prolia 07/14/2014

## 2014-06-03 ENCOUNTER — Ambulatory Visit: Payer: Medicare Other | Admitting: Physician Assistant

## 2014-06-07 ENCOUNTER — Encounter: Payer: Self-pay | Admitting: Internal Medicine

## 2014-06-09 ENCOUNTER — Encounter (HOSPITAL_COMMUNITY): Payer: Self-pay | Admitting: Internal Medicine

## 2014-06-09 ENCOUNTER — Ambulatory Visit: Payer: Medicare Other | Admitting: Physician Assistant

## 2014-06-28 ENCOUNTER — Telehealth: Payer: Self-pay | Admitting: Family Medicine

## 2014-06-28 NOTE — Telephone Encounter (Signed)
Please advise if medication is ok to take.      KP

## 2014-06-28 NOTE — Telephone Encounter (Signed)
Message left on Vm advising it is ok to take the medication and to call the office with any question or concerns.      KP

## 2014-06-28 NOTE — Telephone Encounter (Signed)
Caller name: Ambriana Relation to pt: self Call back number: [phone] Pharmacy:  Reason for call:   Patient states that she is out of town and had to go to the doctors last night for a URI and was prescribed cefdinir 300mg  once daily and wants to know if this is ok for her to take

## 2014-06-28 NOTE — Telephone Encounter (Signed)
Ok to take

## 2014-07-13 ENCOUNTER — Encounter: Payer: Self-pay | Admitting: Family Medicine

## 2014-07-13 ENCOUNTER — Other Ambulatory Visit: Payer: Self-pay | Admitting: *Deleted

## 2014-07-13 DIAGNOSIS — E890 Postprocedural hypothyroidism: Secondary | ICD-10-CM

## 2014-07-13 MED ORDER — LEVOTHYROXINE SODIUM 88 MCG PO TABS: 88.0000 ug | ORAL_TABLET | Freq: Every day | ORAL | Status: DC

## 2014-07-13 MED ORDER — LEVOTHYROXINE SODIUM 88 MCG PO TABS
88.0000 ug | ORAL_TABLET | Freq: Every day | ORAL | Status: DC
Start: 2014-07-13 — End: 2014-07-13

## 2014-07-13 NOTE — Telephone Encounter (Signed)
Patient requested emergency Rx for synthroid (has only 3 left).  Patient has not had TSH since 10/15/13 (in hospital- it was normal).  Prior to that it was checked 11/24/12.  Called patient and scheduled for physical on 08/29/14 per patient preference.  Do you want labs ordered?   Please advise.     eal

## 2014-07-14 ENCOUNTER — Ambulatory Visit (INDEPENDENT_AMBULATORY_CARE_PROVIDER_SITE_OTHER): Payer: Medicare Other

## 2014-07-14 ENCOUNTER — Ambulatory Visit: Payer: Medicare Other

## 2014-07-14 DIAGNOSIS — M810 Age-related osteoporosis without current pathological fracture: Secondary | ICD-10-CM

## 2014-07-14 MED ORDER — DENOSUMAB 60 MG/ML ~~LOC~~ SOLN
60.0000 mg | Freq: Once | SUBCUTANEOUS | Status: AC
  Administered 2014-07-14: 60 mg via SUBCUTANEOUS

## 2014-07-14 NOTE — Progress Notes (Signed)
Pre visit review using our clinic review tool, if applicable. No additional management support is needed unless otherwise documented below in the visit note. Patient tolerated well.  

## 2014-07-19 ENCOUNTER — Ambulatory Visit: Payer: Medicare Other | Admitting: Physician Assistant

## 2014-07-20 ENCOUNTER — Encounter: Payer: Self-pay | Admitting: Physician Assistant

## 2014-07-20 ENCOUNTER — Ambulatory Visit (INDEPENDENT_AMBULATORY_CARE_PROVIDER_SITE_OTHER): Payer: Medicare Other | Admitting: Physician Assistant

## 2014-07-20 VITALS — BP 150/80 | HR 85 | Ht 63.0 in | Wt 129.0 lb

## 2014-07-20 DIAGNOSIS — Z950 Presence of cardiac pacemaker: Secondary | ICD-10-CM

## 2014-07-20 DIAGNOSIS — I10 Essential (primary) hypertension: Secondary | ICD-10-CM

## 2014-07-20 DIAGNOSIS — I442 Atrioventricular block, complete: Secondary | ICD-10-CM

## 2014-07-20 MED ORDER — LISINOPRIL-HYDROCHLOROTHIAZIDE 20-12.5 MG PO TABS: 1.0000 | ORAL_TABLET | Freq: Every day | ORAL | Status: DC

## 2014-07-20 MED ORDER — AMLODIPINE BESYLATE 5 MG PO TABS: 5.0000 mg | ORAL_TABLET | Freq: Every day | ORAL | Status: DC

## 2014-07-20 NOTE — Progress Notes (Signed)
Cardiology Office Note   Date:  07/20/2014   ID:  [name], [name] 15-Jul-1940, MRN [id number]  PCP:  Garnet Koyanagi, DO  Cardiologist/Electrophysiologist:  Dr. Sherren Mocha   Chief Complaint  Patient presents with  . Hypertension     History of Present Illness: [name] is a 74 y.o. female who presents for FU on the above.  She has a history of complete heart block status post pacemaker implantation in 2013, HTN, mitral regurgitation, diastolic dysfunction.  She was seen in follow-up by Dr. Caryl Comes 03/2014. Blood pressure was markedly elevated. Amlodipine was added to her medications. She returns for follow-up.  Since last seen, she traveled to [place]  And developed a upper respiratory tract infection. This is slowly improving. She's had some mild dyspnea. She is NYHA 2-2b. She is somewhat limited by back pain from compression fractures. She denies syncope or chest pain. She denies orthopnea or PND. She has had some dependent edema.  She misunderstood her directions last time and stopped the Lisinopril.     Studies Reviewed Today:   - Echocardiogram 03/16/12: Vigorous LV function, focal basal hypertrophy, EF 65-70%, MV with + SAM, moderate MR   Past Medical History  Diagnosis Date  . Atrioventricular block, complete     a. 08/2011 Upgrage of PPM to MDT Adapta L Dual Chamber PPM ser # [id number].  . Diastolic dysfunction   . HTN (hypertension)   . Acute neck pain   . Tension headache   . Multinodular goiter   . Contact dermatitis and eczema     unspec cause  . Tooth abscess   . Thyroid nodule   . Osteoporosis   . GERD (gastroesophageal reflux disease)   . Depression   . Lung nodule   . Superficial thrombophlebitis   . Hypothyroidism   . Mitral regurgitation     a.03/2012 Echo: EF 65-70%, mod MR  . Chest pain     a. 09/2007 Myoview: EF 77%, no ischemia/infarct.  . Pacemaker   . CHF (congestive heart failure)     Past Surgical History  Procedure Laterality  Date  . Laporatomy for ruptured diverticulum with colostomy  2003  . Take down of colostomy  2004  . Exploratory laparotomy    . Medtronic pacemaker  05/2003  . Hernia repair    . Colon surgery    . Thyroidectomy N/A 08/07/2012    Procedure: THYROIDECTOMY;  Surgeon: Earnstine Regal, MD;  Location: [facility];  Service: General;  Laterality: N/A;  . Colonoscopy  01/19/2013    colonoscopy  . Pacemaker generator change Left 09/16/2011    Procedure: PACEMAKER GENERATOR CHANGE;  Surgeon: Deboraha Sprang, MD;  Location: [facility] CATH LAB;  Service: Cardiovascular;  Laterality: Left;     Current Outpatient Prescriptions  Medication Sig Dispense Refill  . amLODipine (NORVASC) 5 MG tablet Take 1 tablet (5 mg total) by mouth daily. 90 tablet 0  . aspirin 81 MG chewable tablet Chew 1 tablet (81 mg total) by mouth daily. 30 tablet 0  . calcium carbonate (OS-CAL - DOSED IN MG OF ELEMENTAL CALCIUM) 1250 MG tablet Take 500 mg by mouth 3 (three) times daily.    . Chlorpheniramine-APAP (CORICIDIN) 2-325 MG TABS Take 2 tablets by mouth 2 (two) times daily as needed (allergies).    [name] denosumab (PROLIA) 60 MG/ML SOLN Inject 60 mg into the skin every 6 (six) months. Next injection scheduled for Tuesday, May 12th    . diphenhydramine-acetaminophen (TYLENOL PM)  25-500 MG TABS Take 2 tablets by mouth at bedtime as needed (sleep).     [name] HYDROmorphone (DILAUDID) 4 MG tablet Take 1 tablet (4 mg total) by mouth every 4 (four) hours as needed for severe pain. (Patient taking differently: Take 4 mg by mouth daily. ) 60 tablet 0  . levothyroxine (SYNTHROID, LEVOTHROID) 88 MCG tablet Take 1 tablet (88 mcg total) by mouth daily before breakfast. 90 tablet 0  . lisinopril-hydrochlorothiazide (ZESTORETIC) 20-12.5 MG per tablet Take 2 tablets by mouth daily. 180 tablet 3  . Multiple Vitamin (MULTIVITAMIN WITH MINERALS) TABS tablet Take 1 tablet by mouth daily.    [name] omeprazole (PRILOSEC) 20 MG capsule Take 1 capsule (20 mg total) by mouth 2  (two) times daily before a meal. 60 capsule 5  . [DISCONTINUED] famotidine (PEPCID) 20 MG tablet Take 20 mg by mouth as needed.      No current facility-administered medications for this visit.    Allergies:   Tape and Codeine    Social History:  The patient  reports that she quit smoking about 50 years ago. She has never used smokeless tobacco. She reports that she does not drink alcohol or use illicit drugs.   Family History:  The patient's family history includes Cancer in her son; Diverticulosis in her sister; Heart disease in her brother and mother; Heart failure in her father; Hypertension in her brother, brother, and mother; Stroke in her mother. There is no history of Heart attack.    ROS:  Please see the history of present illness.   Otherwise, review of systems are positive for back pain from compression fractures, easy bruising.   All other systems are reviewed and negative.    PHYSICAL EXAM: VS:  BP 150/80 mmHg  Pulse 85  Ht 5\' 3"  (1.6 m)  Wt 129 lb (58.514 kg)  BMI 22.86 kg/m2 , BMI Body mass index is 22.86 kg/(m^2). GEN: Well nourished, well developed, in no acute distress HEENT: normal Neck: no JVD or masses Cardiac: RRR; no murmurs, rubs, or gallops,no edema  Respiratory:  clear to auscultation bilaterally, no wheezing, rhonchi or rales. GI: soft, nontender, nondistended, + BS MS: no deformity or atrophy Skin: warm and dry  Neuro:  Strength and sensation are intact Psych: Normal affect  Wt Readings from Last 3 Encounters:  07/20/14 129 lb (58.514 kg)  04/04/14 123 lb 9.6 oz (56.065 kg)  03/04/14 122 lb (55.339 kg)     EKG:  EKG is ordered today.  It demonstrates:   NSR, Vpaced HR 85   Recent Labs: 10/15/2013: TSH 3.580 12/07/2013: ALT 23 03/04/2014: BUN 14; Creatinine 0.70; Hemoglobin 12.5; Platelets 137*; Potassium 3.9; Sodium 139    ASSESSMENT AND PLAN:  1.  Hypertension:  Better controlled.  She inadvertently stopped the Lisinopril.      -  Continue  Amlodipine.    -  Restart Lisinopril/HCTZ 20/12.5 mg QD.    -  BMET in 1 week.  2.  Heart Block s/p Pacemaker:  FU with EP as planned.   Current medicines are reviewed at length with the patient today.  The patient does not have concerns regarding medicines.  The following changes have been made:  As outlined above.     Labs/ tests ordered today include:  Orders Placed This Encounter  Procedures  . Basic metabolic panel  . EKG 12-Lead    Disposition:   FU with me  in 3 months   Signed, Versie Starks, MHS 07/20/2014 2:35  PM    [facility] [address], [place], [place]  [address] Phone: [phone]; Fax: [phone]

## 2014-07-20 NOTE — Patient Instructions (Addendum)
Continue taking Amlodipine 5 mg daily.  Restart Lisinopril/HCTZ 20/12.5 mg and take ONE tablet daily.  Return for Labs in 1 week:  BMET.  Schedule follow up with Richardson Dopp, PA-C in 3 months.

## 2014-07-27 ENCOUNTER — Other Ambulatory Visit: Payer: Medicare Other

## 2014-07-29 ENCOUNTER — Other Ambulatory Visit (INDEPENDENT_AMBULATORY_CARE_PROVIDER_SITE_OTHER): Payer: Medicare Other | Admitting: *Deleted

## 2014-07-29 DIAGNOSIS — I10 Essential (primary) hypertension: Secondary | ICD-10-CM

## 2014-07-29 LAB — BASIC METABOLIC PANEL
BUN: 13 mg/dL (ref 6–23)
CALCIUM: 10 mg/dL (ref 8.4–10.5)
CO2: 28 mEq/L (ref 19–32)
Chloride: 101 mEq/L (ref 96–112)
Creatinine, Ser: 0.87 mg/dL (ref 0.40–1.20)
GFR: 81.41 mL/min (ref >60.00)
GLUCOSE: 123 mg/dL — AB (ref 70–99)
Potassium: 4 mEq/L (ref 3.5–5.1)
Sodium: 138 mEq/L (ref 135–145)

## 2014-08-01 ENCOUNTER — Telehealth: Payer: Self-pay | Admitting: *Deleted

## 2014-08-01 NOTE — Telephone Encounter (Signed)
pt notified about lab results with verbal understanding. Pt advised to f/u w/PCP due to elevated glucose level. I will fax results to PCP for pt today. Pt said ok and thank you.

## 2014-08-02 ENCOUNTER — Telehealth: Payer: Self-pay

## 2014-08-02 DIAGNOSIS — R7309 Other abnormal glucose: Secondary | ICD-10-CM

## 2014-08-02 NOTE — Telephone Encounter (Signed)
Discussed with patient and apr scheduled for a BMP and A1c for elevated sugar.      KP

## 2014-08-02 NOTE — Telephone Encounter (Addendum)
-----   Message from Oneta Rack sent at 08/02/2014 11:45 AM EST ----- Regarding: Returning your call  Contact: [phone] Pt states she is extremely frustrated because of the phone tag, pt states the number comes up restricted therefore she doesn't pick up. Returning your call      If she is able to have labs done sooner we could discuss them at that ov---glucometer etc.     ----- Message -----     From: Ewing Schlein, CMA     Sent: 08/01/2014  4:23 PM      To: Rosalita Chessman, DO        The patient has an apt on 08/29/14 would you like to have her come in sooner?            KP    ----- Message -----     From: Rosalita Chessman, DO     Sent: 08/01/2014 12:38 PM      To: Ewing Schlein, CMA        Glucose elevated in cardio--- need fasting bmp and [id number]-- hyperglycemia    ----- Message -----     From: Michae Kava, CMA     Sent: 08/01/2014 11:45 AM      To: Rosalita Chessman, DO

## 2014-08-04 ENCOUNTER — Other Ambulatory Visit (INDEPENDENT_AMBULATORY_CARE_PROVIDER_SITE_OTHER): Payer: Medicare Other

## 2014-08-04 DIAGNOSIS — R7309 Other abnormal glucose: Secondary | ICD-10-CM

## 2014-08-04 LAB — BASIC METABOLIC PANEL
BUN: 12 mg/dL (ref 6–23)
CALCIUM: 9.7 mg/dL (ref 8.4–10.5)
CHLORIDE: 102 meq/L (ref 96–112)
CO2: 28 mEq/L (ref 19–32)
Creat: 0.78 mg/dL (ref 0.50–1.10)
Glucose, Bld: 89 mg/dL (ref 70–99)
POTASSIUM: 4.8 meq/L (ref 3.5–5.3)
SODIUM: 138 meq/L (ref 135–145)

## 2014-08-04 LAB — HEMOGLOBIN A1C
HEMOGLOBIN A1C: 5.4 % (ref <5.7)
MEAN PLASMA GLUCOSE: 108 mg/dL (ref <117)

## 2014-08-12 IMAGING — US US ABDOMEN COMPLETE
1 series · 14 of 25 positions shown · non-contrast
Comparison: CT 10/14/2013

CLINICAL DATA: Abdominal pain

EXAM:
ULTRASOUND ABDOMEN COMPLETE

[Series 1: us abdomen complete · 0.20mm/px · 14 of 75 slices shown]
[im 1/75]
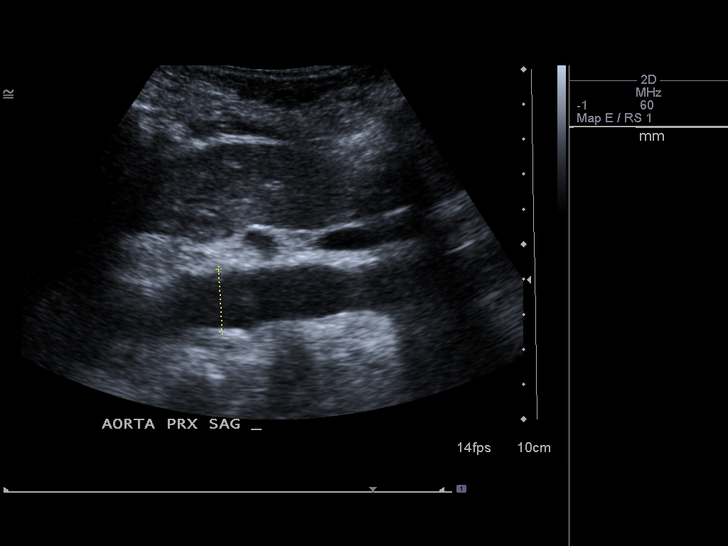
[im 7/75]
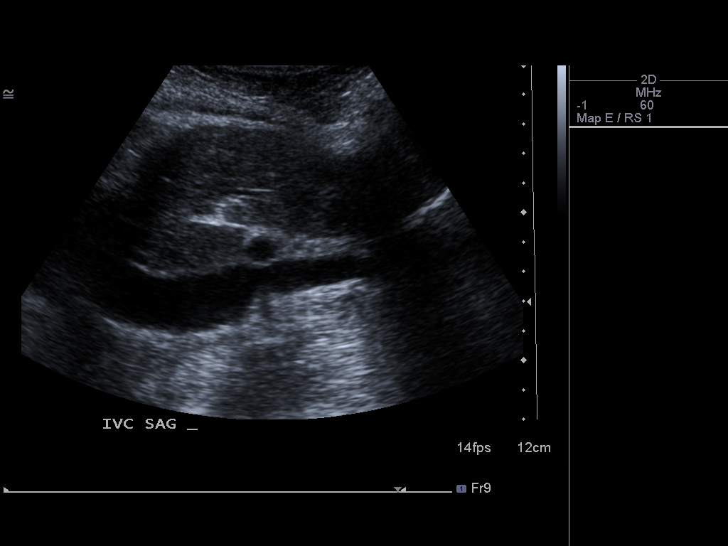
[im 13/75]
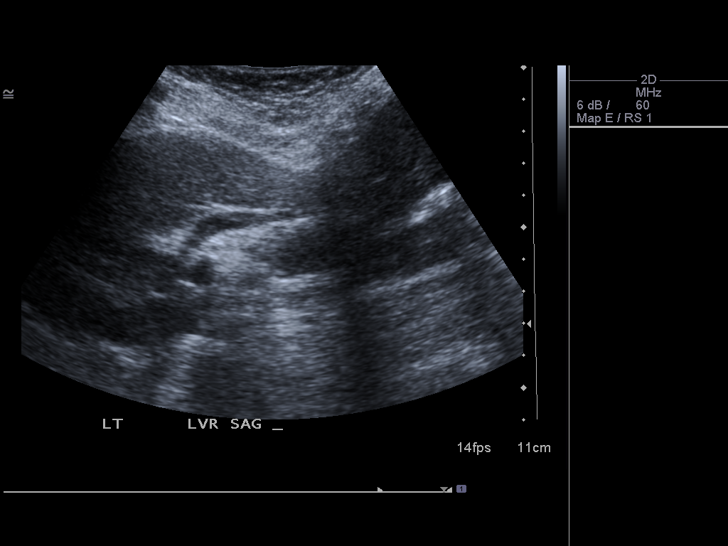
[im 19/75]
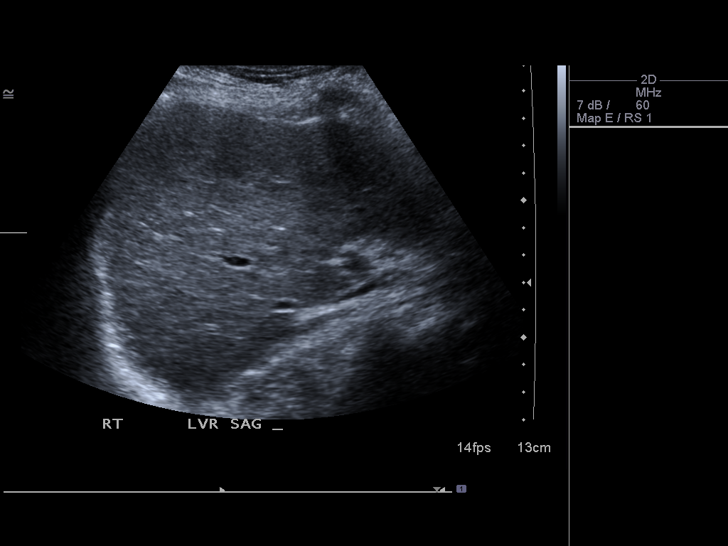
[im 25/75]
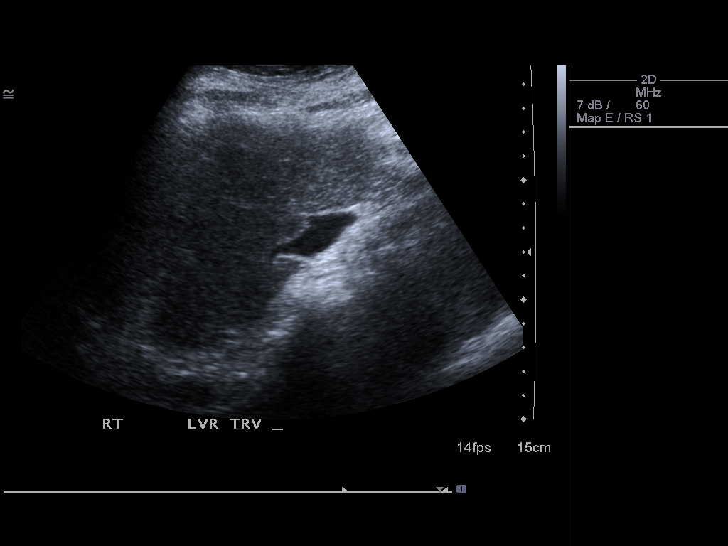
[im 28/75]
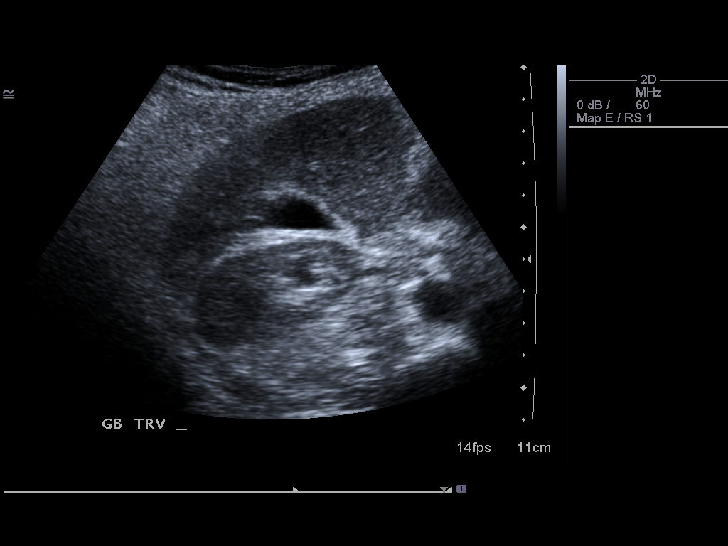
[im 34/75]
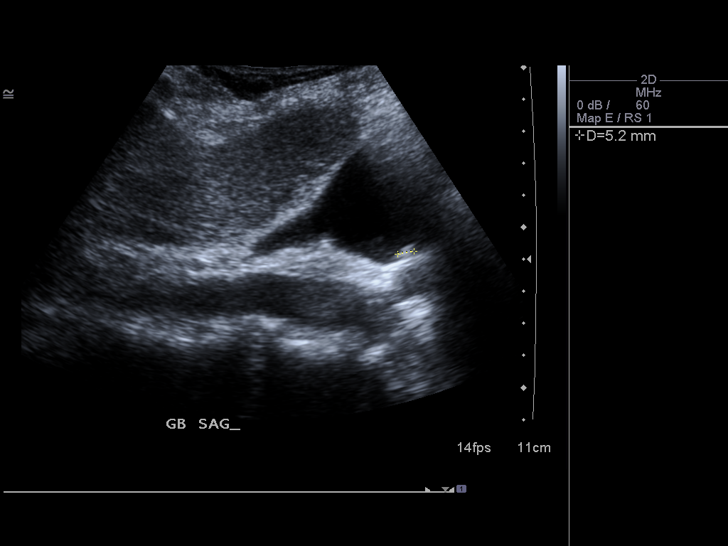
[im 41/75]
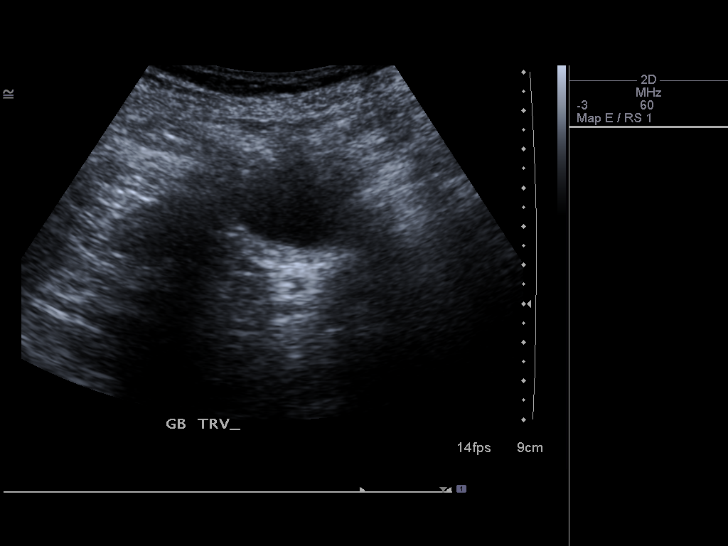
[im 47/75]
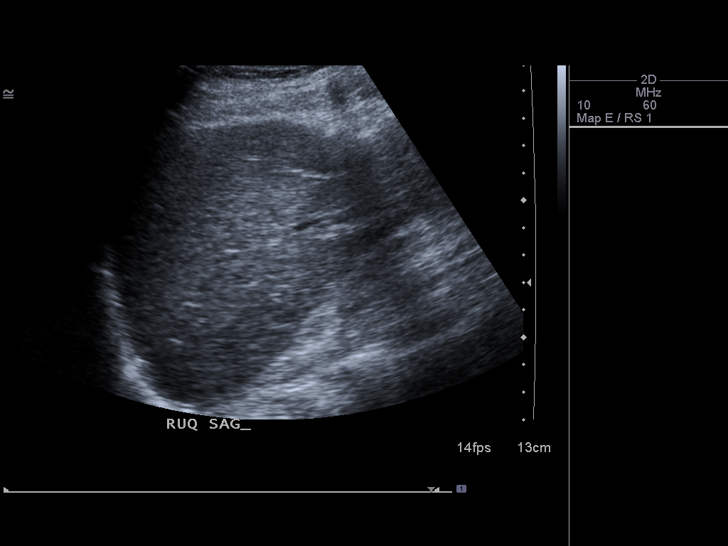
[im 50/75]
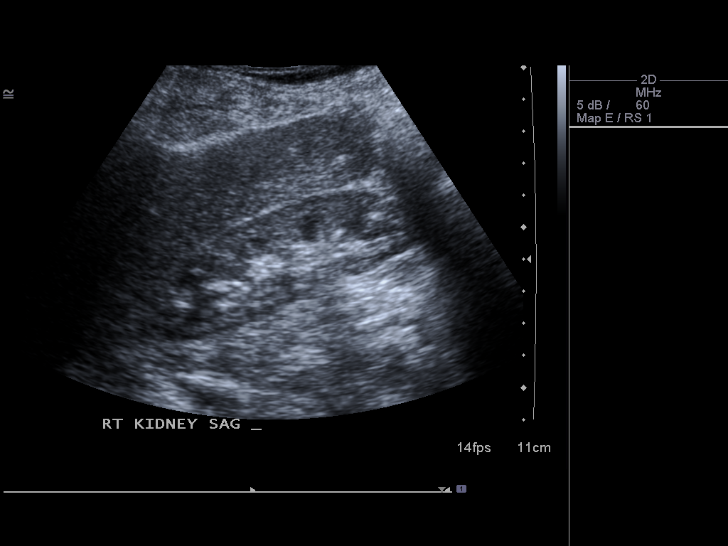
[im 56/75]
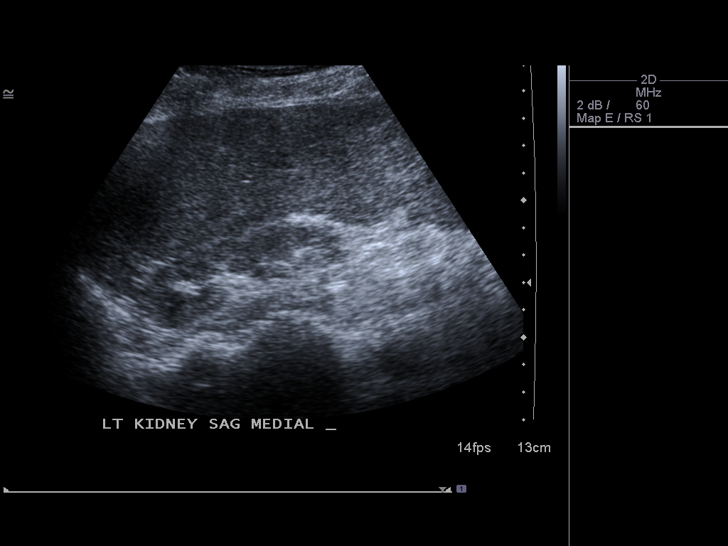
[im 62/75]
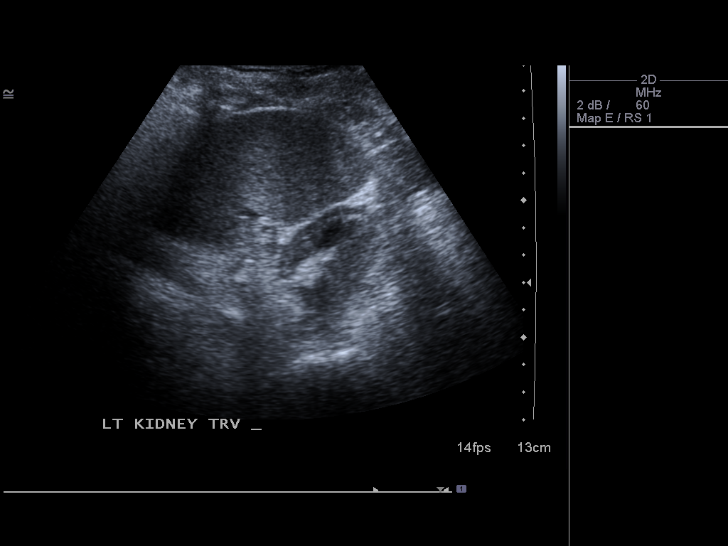
[im 68/75]
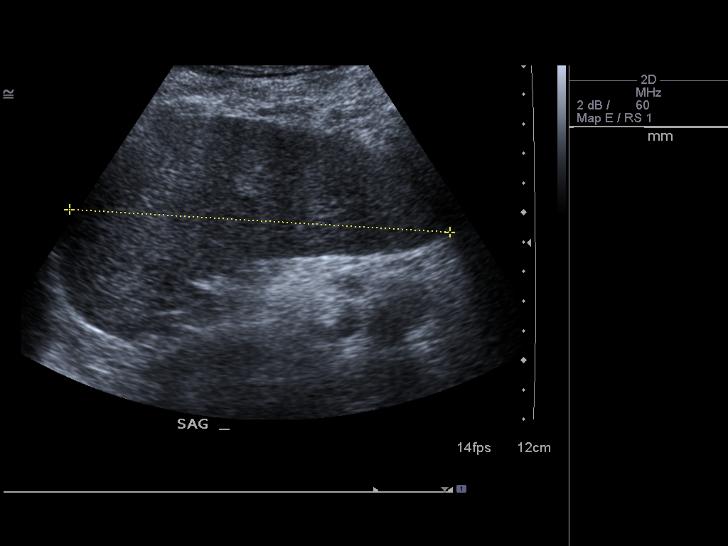
[im 75/75]
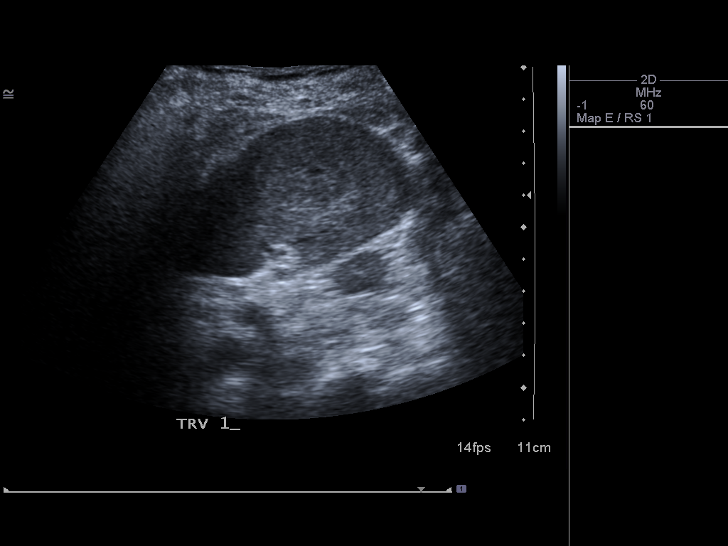

[14 of 25 positions shown; findings below may reference images not displayed]

FINDINGS: Gallbladder:

5 mm gallstone. Negative sonographic Murphy sign. Gallbladder wall
not thickened.

Common bile duct:

Diameter: 2 mm

Liver:

No focal lesion identified. Within normal limits in parenchymal
echogenicity.

IVC:

Limited

Pancreas:

Limited

Spleen:

12.9 cm in length. Slightly hyperechoic mass measures 2.5 x 3.2 x
2.5 cm. This is unchanged from the prior CT. Additional smaller
splenic lesions also noted on the CT.

Right Kidney:

Length: 9.7 cm. Echogenicity within normal limits. No mass or
hydronephrosis visualized.

Left Kidney:

Length: 9.4 cm. Echogenicity within normal limits. No mass or
hydronephrosis visualized.

Abdominal aorta:

No aneurysm visualized.

Other findings:

Limited study due to large amount of bowel gas.
IMPRESSION: 5 mm gallstone without evidence of cholecystitis.

Mild splenomegaly with multiple splenic lesions. The largest
hyperechoic lesion is unchanged from the CT and may represent
hemangioma. Lymphoma and metastatic disease also in the
differential.

## 2014-08-29 ENCOUNTER — Ambulatory Visit (INDEPENDENT_AMBULATORY_CARE_PROVIDER_SITE_OTHER): Payer: Medicare Other | Admitting: Family Medicine

## 2014-08-29 ENCOUNTER — Encounter: Payer: Self-pay | Admitting: Family Medicine

## 2014-08-29 VITALS — BP 148/92 | HR 86 | Temp 97.5°F | Ht 63.0 in | Wt 131.4 lb

## 2014-08-29 DIAGNOSIS — Z1231 Encounter for screening mammogram for malignant neoplasm of breast: Secondary | ICD-10-CM

## 2014-08-29 DIAGNOSIS — I10 Essential (primary) hypertension: Secondary | ICD-10-CM

## 2014-08-29 DIAGNOSIS — E039 Hypothyroidism, unspecified: Secondary | ICD-10-CM

## 2014-08-29 DIAGNOSIS — M546 Pain in thoracic spine: Secondary | ICD-10-CM

## 2014-08-29 DIAGNOSIS — M545 Low back pain, unspecified: Secondary | ICD-10-CM

## 2014-08-29 DIAGNOSIS — Z1239 Encounter for other screening for malignant neoplasm of breast: Secondary | ICD-10-CM

## 2014-08-29 DIAGNOSIS — Z23 Encounter for immunization: Secondary | ICD-10-CM

## 2014-08-29 DIAGNOSIS — Z0000 Encounter for general adult medical examination without abnormal findings: Secondary | ICD-10-CM

## 2014-08-29 DIAGNOSIS — E2839 Other primary ovarian failure: Secondary | ICD-10-CM

## 2014-08-29 MED ORDER — LISINOPRIL-HYDROCHLOROTHIAZIDE 20-12.5 MG PO TABS: 2.0000 | ORAL_TABLET | Freq: Every day | ORAL | Status: DC

## 2014-08-29 MED ORDER — TRAMADOL HCL 50 MG PO TABS
ORAL_TABLET | ORAL | Status: DC
Start: 2014-08-29 — End: 2014-10-17

## 2014-08-29 NOTE — Patient Instructions (Signed)
Preventive Care for Adults A healthy lifestyle and preventive care can promote health and wellness. Preventive health guidelines for women include the following key practices.  A routine yearly physical is a good way to check with your health care provider about your health and preventive screening. It is a chance to share any concerns and updates on your health and to receive a thorough exam.  Visit your dentist for a routine exam and preventive care every 6 months. Brush your teeth twice a day and floss once a day. Good oral hygiene prevents tooth decay and gum disease.  The frequency of eye exams is based on your age, health, family medical history, use of contact lenses, and other factors. Follow your health care provider's recommendations for frequency of eye exams.  Eat a healthy diet. Foods like vegetables, fruits, whole grains, low-fat dairy products, and lean protein foods contain the nutrients you need without too many calories. Decrease your intake of foods high in solid fats, added sugars, and salt. Eat the right amount of calories for you.Get information about a proper diet from your health care provider, if necessary.  Regular physical exercise is one of the most important things you can do for your health. Most adults should get at least 150 minutes of moderate-intensity exercise (any activity that increases your heart rate and causes you to sweat) each week. In addition, most adults need muscle-strengthening exercises on 2 or more days a week.  Maintain a healthy weight. The body mass index (BMI) is a screening tool to identify possible weight problems. It provides an estimate of body fat based on height and weight. Your health care provider can find your BMI and can help you achieve or maintain a healthy weight.For adults 20 years and older:  A BMI below 18.5 is considered underweight.  A BMI of 18.5 to 24.9 is normal.  A BMI of 25 to 29.9 is considered overweight.  A BMI of  30 and above is considered obese.  Maintain normal blood lipids and cholesterol levels by exercising and minimizing your intake of saturated fat. Eat a balanced diet with plenty of fruit and vegetables. Blood tests for lipids and cholesterol should begin at age 76 and be repeated every 5 years. If your lipid or cholesterol levels are high, you are over 50, or you are at high risk for heart disease, you may need your cholesterol levels checked more frequently.Ongoing high lipid and cholesterol levels should be treated with medicines if diet and exercise are not working.  If you smoke, find out from your health care provider how to quit. If you do not use tobacco, do not start.  Lung cancer screening is recommended for adults aged 22-80 years who are at high risk for developing lung cancer because of a history of smoking. A yearly low-dose CT scan of the lungs is recommended for people who have at least a 30-pack-year history of smoking and are a current smoker or have quit within the past 15 years. A pack year of smoking is smoking an average of 1 pack of cigarettes a day for 1 year (for example: 1 pack a day for 30 years or 2 packs a day for 15 years). Yearly screening should continue until the smoker has stopped smoking for at least 15 years. Yearly screening should be stopped for people who develop a health problem that would prevent them from having lung cancer treatment.  If you are pregnant, do not drink alcohol. If you are breastfeeding,  be very cautious about drinking alcohol. If you are not pregnant and choose to drink alcohol, do not have more than 1 drink per day. One drink is considered to be 12 ounces (355 mL) of beer, 5 ounces (148 mL) of wine, or 1.5 ounces (44 mL) of liquor.  Avoid use of street drugs. Do not share needles with anyone. Ask for help if you need support or instructions about stopping the use of drugs.  High blood pressure causes heart disease and increases the risk of  stroke. Your blood pressure should be checked at least every 1 to 2 years. Ongoing high blood pressure should be treated with medicines if weight loss and exercise do not work.  If you are 75-52 years old, ask your health care provider if you should take aspirin to prevent strokes.  Diabetes screening involves taking a blood sample to check your fasting blood sugar level. This should be done once every 3 years, after age 15, if you are within normal weight and without risk factors for diabetes. Testing should be considered at a younger age or be carried out more frequently if you are overweight and have at least 1 risk factor for diabetes.  Breast cancer screening is essential preventive care for women. You should practice "breast self-awareness." This means understanding the normal appearance and feel of your breasts and may include breast self-examination. Any changes detected, no matter how small, should be reported to a health care provider. Women in their 58s and 30s should have a clinical breast exam (CBE) by a health care provider as part of a regular health exam every 1 to 3 years. After age 16, women should have a CBE every year. Starting at age 53, women should consider having a mammogram (breast X-ray test) every year. Women who have a family history of breast cancer should talk to their health care provider about genetic screening. Women at a high risk of breast cancer should talk to their health care providers about having an MRI and a mammogram every year.  Breast cancer gene (BRCA)-related cancer risk assessment is recommended for women who have family members with BRCA-related cancers. BRCA-related cancers include breast, ovarian, tubal, and peritoneal cancers. Having family members with these cancers may be associated with an increased risk for harmful changes (mutations) in the breast cancer genes BRCA1 and BRCA2. Results of the assessment will determine the need for genetic counseling and  BRCA1 and BRCA2 testing.  Routine pelvic exams to screen for cancer are no longer recommended for nonpregnant women who are considered low risk for cancer of the pelvic organs (ovaries, uterus, and vagina) and who do not have symptoms. Ask your health care provider if a screening pelvic exam is right for you.  If you have had past treatment for cervical cancer or a condition that could lead to cancer, you need Pap tests and screening for cancer for at least 20 years after your treatment. If Pap tests have been discontinued, your risk factors (such as having a new sexual partner) need to be reassessed to determine if screening should be resumed. Some women have medical problems that increase the chance of getting cervical cancer. In these cases, your health care provider may recommend more frequent screening and Pap tests.  The HPV test is an additional test that may be used for cervical cancer screening. The HPV test looks for the virus that can cause the cell changes on the cervix. The cells collected during the Pap test can be  tested for HPV. The HPV test could be used to screen women aged 30 years and older, and should be used in women of any age who have unclear Pap test results. After the age of 30, women should have HPV testing at the same frequency as a Pap test.  Colorectal cancer can be detected and often prevented. Most routine colorectal cancer screening begins at the age of 50 years and continues through age 75 years. However, your health care provider may recommend screening at an earlier age if you have risk factors for colon cancer. On a yearly basis, your health care provider may provide home test kits to check for hidden blood in the stool. Use of a small camera at the end of a tube, to directly examine the colon (sigmoidoscopy or colonoscopy), can detect the earliest forms of colorectal cancer. Talk to your health care provider about this at age 50, when routine screening begins. Direct  exam of the colon should be repeated every 5-10 years through age 75 years, unless early forms of pre-cancerous polyps or small growths are found.  People who are at an increased risk for hepatitis B should be screened for this virus. You are considered at high risk for hepatitis B if:  You were born in a country where hepatitis B occurs often. Talk with your health care provider about which countries are considered high risk.  Your parents were born in a high-risk country and you have not received a shot to protect against hepatitis B (hepatitis B vaccine).  You have HIV or AIDS.  You use needles to inject street drugs.  You live with, or have sex with, someone who has hepatitis B.  You get hemodialysis treatment.  You take certain medicines for conditions like cancer, organ transplantation, and autoimmune conditions.  Hepatitis C blood testing is recommended for all people born from 1945 through 1965 and any individual with known risks for hepatitis C.  Practice safe sex. Use condoms and avoid high-risk sexual practices to reduce the spread of sexually transmitted infections (STIs). STIs include gonorrhea, chlamydia, syphilis, trichomonas, herpes, HPV, and human immunodeficiency virus (HIV). Herpes, HIV, and HPV are viral illnesses that have no cure. They can result in disability, cancer, and death.  You should be screened for sexually transmitted illnesses (STIs) including gonorrhea and chlamydia if:  You are sexually active and are younger than 24 years.  You are older than 24 years and your health care provider tells you that you are at risk for this type of infection.  Your sexual activity has changed since you were last screened and you are at an increased risk for chlamydia or gonorrhea. Ask your health care provider if you are at risk.  If you are at risk of being infected with HIV, it is recommended that you take a prescription medicine daily to prevent HIV infection. This is  called preexposure prophylaxis (PrEP). You are considered at risk if:  You are a heterosexual woman, are sexually active, and are at increased risk for HIV infection.  You take drugs by injection.  You are sexually active with a partner who has HIV.  Talk with your health care provider about whether you are at high risk of being infected with HIV. If you choose to begin PrEP, you should first be tested for HIV. You should then be tested every 3 months for as long as you are taking PrEP.  Osteoporosis is a disease in which the bones lose minerals and strength   with aging. This can result in serious bone fractures or breaks. The risk of osteoporosis can be identified using a bone density scan. Women ages 65 years and over and women at risk for fractures or osteoporosis should discuss screening with their health care providers. Ask your health care provider whether you should take a calcium supplement or vitamin D to reduce the rate of osteoporosis.  Menopause can be associated with physical symptoms and risks. Hormone replacement therapy is available to decrease symptoms and risks. You should talk to your health care provider about whether hormone replacement therapy is right for you.  Use sunscreen. Apply sunscreen liberally and repeatedly throughout the day. You should seek shade when your shadow is shorter than you. Protect yourself by wearing long sleeves, pants, a wide-brimmed hat, and sunglasses year round, whenever you are outdoors.  Once a month, do a whole body skin exam, using a mirror to look at the skin on your back. Tell your health care provider of new moles, moles that have irregular borders, moles that are larger than a pencil eraser, or moles that have changed in shape or color.  Stay current with required vaccines (immunizations).  Influenza vaccine. All adults should be immunized every year.  Tetanus, diphtheria, and acellular pertussis (Td, Tdap) vaccine. Pregnant women should  receive 1 dose of Tdap vaccine during each pregnancy. The dose should be obtained regardless of the length of time since the last dose. Immunization is preferred during the 27th-36th week of gestation. An adult who has not previously received Tdap or who does not know her vaccine status should receive 1 dose of Tdap. This initial dose should be followed by tetanus and diphtheria toxoids (Td) booster doses every 10 years. Adults with an unknown or incomplete history of completing a 3-dose immunization series with Td-containing vaccines should begin or complete a primary immunization series including a Tdap dose. Adults should receive a Td booster every 10 years.  Varicella vaccine. An adult without evidence of immunity to varicella should receive 2 doses or a second dose if she has previously received 1 dose. Pregnant females who do not have evidence of immunity should receive the first dose after pregnancy. This first dose should be obtained before leaving the health care facility. The second dose should be obtained 4-8 weeks after the first dose.  Human papillomavirus (HPV) vaccine. Females aged 13-26 years who have not received the vaccine previously should obtain the 3-dose series. The vaccine is not recommended for use in pregnant females. However, pregnancy testing is not needed before receiving a dose. If a female is found to be pregnant after receiving a dose, no treatment is needed. In that case, the remaining doses should be delayed until after the pregnancy. Immunization is recommended for any person with an immunocompromised condition through the age of 26 years if she did not get any or all doses earlier. During the 3-dose series, the second dose should be obtained 4-8 weeks after the first dose. The third dose should be obtained 24 weeks after the first dose and 16 weeks after the second dose.  Zoster vaccine. One dose is recommended for adults aged 60 years or older unless certain conditions are  present.  Measles, mumps, and rubella (MMR) vaccine. Adults born before 1957 generally are considered immune to measles and mumps. Adults born in 1957 or later should have 1 or more doses of MMR vaccine unless there is a contraindication to the vaccine or there is laboratory evidence of immunity to   each of the three diseases. A routine second dose of MMR vaccine should be obtained at least 28 days after the first dose for students attending postsecondary schools, health care workers, or international travelers. People who received inactivated measles vaccine or an unknown type of measles vaccine during 1963-1967 should receive 2 doses of MMR vaccine. People who received inactivated mumps vaccine or an unknown type of mumps vaccine before 1979 and are at high risk for mumps infection should consider immunization with 2 doses of MMR vaccine. For females of childbearing age, rubella immunity should be determined. If there is no evidence of immunity, females who are not pregnant should be vaccinated. If there is no evidence of immunity, females who are pregnant should delay immunization until after pregnancy. Unvaccinated health care workers born before 1957 who lack laboratory evidence of measles, mumps, or rubella immunity or laboratory confirmation of disease should consider measles and mumps immunization with 2 doses of MMR vaccine or rubella immunization with 1 dose of MMR vaccine.  Pneumococcal 13-valent conjugate (PCV13) vaccine. When indicated, a person who is uncertain of her immunization history and has no record of immunization should receive the PCV13 vaccine. An adult aged 19 years or older who has certain medical conditions and has not been previously immunized should receive 1 dose of PCV13 vaccine. This PCV13 should be followed with a dose of pneumococcal polysaccharide (PPSV23) vaccine. The PPSV23 vaccine dose should be obtained at least 8 weeks after the dose of PCV13 vaccine. An adult aged 19  years or older who has certain medical conditions and previously received 1 or more doses of PPSV23 vaccine should receive 1 dose of PCV13. The PCV13 vaccine dose should be obtained 1 or more years after the last PPSV23 vaccine dose.  Pneumococcal polysaccharide (PPSV23) vaccine. When PCV13 is also indicated, PCV13 should be obtained first. All adults aged 65 years and older should be immunized. An adult younger than age 65 years who has certain medical conditions should be immunized. Any person who resides in a nursing home or long-term care facility should be immunized. An adult smoker should be immunized. People with an immunocompromised condition and certain other conditions should receive both PCV13 and PPSV23 vaccines. People with human immunodeficiency virus (HIV) infection should be immunized as soon as possible after diagnosis. Immunization during chemotherapy or radiation therapy should be avoided. Routine use of PPSV23 vaccine is not recommended for American Indians, [place] Natives, or people younger than 65 years unless there are medical conditions that require PPSV23 vaccine. When indicated, people who have unknown immunization and have no record of immunization should receive PPSV23 vaccine. One-time revaccination 5 years after the first dose of PPSV23 is recommended for people aged 19-64 years who have chronic kidney failure, nephrotic syndrome, asplenia, or immunocompromised conditions. People who received 1-2 doses of PPSV23 before age 65 years should receive another dose of PPSV23 vaccine at age 65 years or later if at least 5 years have passed since the previous dose. Doses of PPSV23 are not needed for people immunized with PPSV23 at or after age 65 years.  Meningococcal vaccine. Adults with asplenia or persistent complement component deficiencies should receive 2 doses of quadrivalent meningococcal conjugate (MenACWY-D) vaccine. The doses should be obtained at least 2 months apart.  Microbiologists working with certain meningococcal bacteria, military recruits, people at risk during an outbreak, and people who travel to or live in countries with a high rate of meningitis should be immunized. A first-year college student up through age   21 years who is living in a residence hall should receive a dose if she did not receive a dose on or after her 16th birthday. Adults who have certain high-risk conditions should receive one or more doses of vaccine.  Hepatitis A vaccine. Adults who wish to be protected from this disease, have certain high-risk conditions, work with hepatitis A-infected animals, work in hepatitis A research labs, or travel to or work in countries with a high rate of hepatitis A should be immunized. Adults who were previously unvaccinated and who anticipate close contact with an international adoptee during the first 60 days after arrival in the [place] from a country with a high rate of hepatitis A should be immunized.  Hepatitis B vaccine. Adults who wish to be protected from this disease, have certain high-risk conditions, may be exposed to blood or other infectious body fluids, are household contacts or sex partners of hepatitis B positive people, are clients or workers in certain care facilities, or travel to or work in countries with a high rate of hepatitis B should be immunized.  Haemophilus influenzae type b (Hib) vaccine. A previously unvaccinated person with asplenia or sickle cell disease or having a scheduled splenectomy should receive 1 dose of Hib vaccine. Regardless of previous immunization, a recipient of a hematopoietic stem cell transplant should receive a 3-dose series 6-12 months after her successful transplant. Hib vaccine is not recommended for adults with HIV infection. Preventive Services / Frequency Ages 64 to 68 years  Blood pressure check.** / Every 1 to 2 years.  Lipid and cholesterol check.** / Every 5 years beginning at age  22.  Clinical breast exam.** / Every 3 years for women in their 88s and 53s.  BRCA-related cancer risk assessment.** / For women who have family members with a BRCA-related cancer (breast, ovarian, tubal, or peritoneal cancers).  Pap test.** / Every 2 years from ages 90 through 51. Every 3 years starting at age 21 through age 56 or 3 with a history of 3 consecutive normal Pap tests.  HPV screening.** / Every 3 years from ages 24 through ages 1 to 46 with a history of 3 consecutive normal Pap tests.  Hepatitis C blood test.** / For any individual with known risks for hepatitis C.  Skin self-exam. / Monthly.  Influenza vaccine. / Every year.  Tetanus, diphtheria, and acellular pertussis (Tdap, Td) vaccine.** / Consult your health care provider. Pregnant women should receive 1 dose of Tdap vaccine during each pregnancy. 1 dose of Td every 10 years.  Varicella vaccine.** / Consult your health care provider. Pregnant females who do not have evidence of immunity should receive the first dose after pregnancy.  HPV vaccine. / 3 doses over 6 months, if 72 and younger. The vaccine is not recommended for use in pregnant females. However, pregnancy testing is not needed before receiving a dose.  Measles, mumps, rubella (MMR) vaccine.** / You need at least 1 dose of MMR if you were born in 1957 or later. You may also need a 2nd dose. For females of childbearing age, rubella immunity should be determined. If there is no evidence of immunity, females who are not pregnant should be vaccinated. If there is no evidence of immunity, females who are pregnant should delay immunization until after pregnancy.  Pneumococcal 13-valent conjugate (PCV13) vaccine.** / Consult your health care provider.  Pneumococcal polysaccharide (PPSV23) vaccine.** / 1 to 2 doses if you smoke cigarettes or if you have certain conditions.  Meningococcal vaccine.** /  1 dose if you are age 19 to 21 years and a first-year college  student living in a residence hall, or have one of several medical conditions, you need to get vaccinated against meningococcal disease. You may also need additional booster doses.  Hepatitis A vaccine.** / Consult your health care provider.  Hepatitis B vaccine.** / Consult your health care provider.  Haemophilus influenzae type b (Hib) vaccine.** / Consult your health care provider. Ages 40 to 64 years  Blood pressure check.** / Every 1 to 2 years.  Lipid and cholesterol check.** / Every 5 years beginning at age 20 years.  Lung cancer screening. / Every year if you are aged 55-80 years and have a 30-pack-year history of smoking and currently smoke or have quit within the past 15 years. Yearly screening is stopped once you have quit smoking for at least 15 years or develop a health problem that would prevent you from having lung cancer treatment.  Clinical breast exam.** / Every year after age 40 years.  BRCA-related cancer risk assessment.** / For women who have family members with a BRCA-related cancer (breast, ovarian, tubal, or peritoneal cancers).  Mammogram.** / Every year beginning at age 40 years and continuing for as long as you are in good health. Consult with your health care provider.  Pap test.** / Every 3 years starting at age 30 years through age 65 or 70 years with a history of 3 consecutive normal Pap tests.  HPV screening.** / Every 3 years from ages 30 years through ages 65 to 70 years with a history of 3 consecutive normal Pap tests.  Fecal occult blood test (FOBT) of stool. / Every year beginning at age 50 years and continuing until age 75 years. You may not need to do this test if you get a colonoscopy every 10 years.  Flexible sigmoidoscopy or colonoscopy.** / Every 5 years for a flexible sigmoidoscopy or every 10 years for a colonoscopy beginning at age 50 years and continuing until age 75 years.  Hepatitis C blood test.** / For all people born from 1945 through  1965 and any individual with known risks for hepatitis C.  Skin self-exam. / Monthly.  Influenza vaccine. / Every year.  Tetanus, diphtheria, and acellular pertussis (Tdap/Td) vaccine.** / Consult your health care provider. Pregnant women should receive 1 dose of Tdap vaccine during each pregnancy. 1 dose of Td every 10 years.  Varicella vaccine.** / Consult your health care provider. Pregnant females who do not have evidence of immunity should receive the first dose after pregnancy.  Zoster vaccine.** / 1 dose for adults aged 60 years or older.  Measles, mumps, rubella (MMR) vaccine.** / You need at least 1 dose of MMR if you were born in 1957 or later. You may also need a 2nd dose. For females of childbearing age, rubella immunity should be determined. If there is no evidence of immunity, females who are not pregnant should be vaccinated. If there is no evidence of immunity, females who are pregnant should delay immunization until after pregnancy.  Pneumococcal 13-valent conjugate (PCV13) vaccine.** / Consult your health care provider.  Pneumococcal polysaccharide (PPSV23) vaccine.** / 1 to 2 doses if you smoke cigarettes or if you have certain conditions.  Meningococcal vaccine.** / Consult your health care provider.  Hepatitis A vaccine.** / Consult your health care provider.  Hepatitis B vaccine.** / Consult your health care provider.  Haemophilus influenzae type b (Hib) vaccine.** / Consult your health care provider. Ages 65   years and over  Blood pressure check.** / Every 1 to 2 years.  Lipid and cholesterol check.** / Every 5 years beginning at age 22 years.  Lung cancer screening. / Every year if you are aged 73-80 years and have a 30-pack-year history of smoking and currently smoke or have quit within the past 15 years. Yearly screening is stopped once you have quit smoking for at least 15 years or develop a health problem that would prevent you from having lung cancer  treatment.  Clinical breast exam.** / Every year after age 4 years.  BRCA-related cancer risk assessment.** / For women who have family members with a BRCA-related cancer (breast, ovarian, tubal, or peritoneal cancers).  Mammogram.** / Every year beginning at age 40 years and continuing for as long as you are in good health. Consult with your health care provider.  Pap test.** / Every 3 years starting at age 9 years through age 34 or 91 years with 3 consecutive normal Pap tests. Testing can be stopped between 65 and 70 years with 3 consecutive normal Pap tests and no abnormal Pap or HPV tests in the past 10 years.  HPV screening.** / Every 3 years from ages 57 years through ages 64 or 45 years with a history of 3 consecutive normal Pap tests. Testing can be stopped between 65 and 70 years with 3 consecutive normal Pap tests and no abnormal Pap or HPV tests in the past 10 years.  Fecal occult blood test (FOBT) of stool. / Every year beginning at age 15 years and continuing until age 17 years. You may not need to do this test if you get a colonoscopy every 10 years.  Flexible sigmoidoscopy or colonoscopy.** / Every 5 years for a flexible sigmoidoscopy or every 10 years for a colonoscopy beginning at age 86 years and continuing until age 71 years.  Hepatitis C blood test.** / For all people born from [birth year] through 1967 and any individual with known risks for hepatitis C.  Osteoporosis screening.** / A one-time screening for women ages 83 years and over and women at risk for fractures or osteoporosis.  Skin self-exam. / Monthly.  Influenza vaccine. / Every year.  Tetanus, diphtheria, and acellular pertussis (Tdap/Td) vaccine.** / 1 dose of Td every 10 years.  Varicella vaccine.** / Consult your health care provider.  Zoster vaccine.** / 1 dose for adults aged 61 years or older.  Pneumococcal 13-valent conjugate (PCV13) vaccine.** / Consult your health care provider.  Pneumococcal  polysaccharide (PPSV23) vaccine.** / 1 dose for all adults aged 28 years and older.  Meningococcal vaccine.** / Consult your health care provider.  Hepatitis A vaccine.** / Consult your health care provider.  Hepatitis B vaccine.** / Consult your health care provider.  Haemophilus influenzae type b (Hib) vaccine.** / Consult your health care provider. ** Family history and personal history of risk and conditions may change your health care provider's recommendations. Document Released: 08/13/2001 Document Revised: 11/01/2013 Document Reviewed: 11/12/2010 [facility] Patient Information 2015 [place], [place]. This information is not intended to replace advice given to you by your health care provider. Make sure you discuss any questions you have with your health care provider.

## 2014-08-29 NOTE — Progress Notes (Signed)
Subjective:    [name] is a 74 y.o. female who presents for Medicare Annual/Subsequent preventive examination. Pt is here to f/u bp, thyroid,   Preventive Screening-Counseling & Management  Tobacco History  Smoking status  . Former Smoker  . Quit date: 10/06/1963  Smokeless tobacco  . Never Used    Comment: quit 30 years ago     Problems Prior to Visit 1. Nothing new  Current Problems (verified) Patient Active Problem List   Diagnosis Date Noted  . Fecal impaction 10/16/2013  . Vertebral compression fracture 10/16/2013  . Constipation 10/15/2013  . Abdominal pain 10/15/2013  . PUD (peptic ulcer disease) 10/15/2013  . Low back pain 10/15/2013  . 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 Pacemaker-Medtronic 02/01/2011  . AV BLOCK, COMPLETE 11/23/2009  . LIPOMAS, MULTIPLE 11/30/2008  . GANGLION OF TENDON SHEATH 11/30/2008  . SUPERFICIAL THROMBOPHLEBITIS 10/10/2008  . LUNG NODULE 10/10/2008  . HEADACHE, TENSION 05/02/2008  . GOITER, MULTINODULAR 01/19/2008  . DIASTOLIC DYSFUNCTION 90/24/0973  . DEPRESSION 01/23/2007  . HYPERTENSION 01/23/2007  . GERD 01/23/2007  . OSTEOPOROSIS 01/23/2007  . Personal history of other endocrine, metabolic, and immunity disorders 01/23/2007    Medications Prior to Visit Current Outpatient Prescriptions on File Prior to Visit  Medication Sig Dispense Refill  . aspirin 81 MG chewable tablet Chew 1 tablet (81 mg total) by mouth daily. 30 tablet 0  . calcium carbonate (OS-CAL - DOSED IN MG OF ELEMENTAL CALCIUM) 1250 MG tablet Take 500 mg by mouth 3 (three) times daily.    . Chlorpheniramine-APAP (CORICIDIN) 2-325 MG TABS Take 2 tablets by mouth 2 (two) times daily as needed (allergies).    [name] denosumab (PROLIA) 60 MG/ML SOLN Inject 60 mg into the skin every 6 (six) months. Next injection scheduled for Tuesday, May 12th    . diphenhydramine-acetaminophen (TYLENOL PM)  25-500 MG TABS Take 2 tablets by mouth at bedtime as needed (sleep).     [name] HYDROmorphone (DILAUDID) 4 MG tablet Take 1 tablet (4 mg total) by mouth every 4 (four) hours as needed for severe pain. (Patient taking differently: Take 4 mg by mouth daily. ) 60 tablet 0  . levothyroxine (SYNTHROID, LEVOTHROID) 88 MCG tablet Take 1 tablet (88 mcg total) by mouth daily before breakfast. 90 tablet 0  . Multiple Vitamin (MULTIVITAMIN WITH MINERALS) TABS tablet Take 1 tablet by mouth daily.    [name] omeprazole (PRILOSEC) 20 MG capsule Take 1 capsule (20 mg total) by mouth 2 (two) times daily before a meal. 60 capsule 5  . [DISCONTINUED] famotidine (PEPCID) 20 MG tablet Take 20 mg by mouth as needed.      No current facility-administered medications on file prior to visit.    Current Medications (verified) Current Outpatient Prescriptions  Medication Sig Dispense Refill  . aspirin 81 MG chewable tablet Chew 1 tablet (81 mg total) by mouth daily. 30 tablet 0  . calcium carbonate (OS-CAL - DOSED IN MG OF ELEMENTAL CALCIUM) 1250 MG tablet Take 500 mg by mouth 3 (three) times daily.    . Chlorpheniramine-APAP (CORICIDIN) 2-325 MG TABS Take 2 tablets by mouth 2 (two) times daily as needed (allergies).    [name] denosumab (PROLIA) 60 MG/ML SOLN Inject 60 mg into the skin every 6 (six) months. Next injection scheduled for Tuesday, May 12th    . diphenhydramine-acetaminophen (TYLENOL PM) 25-500 MG TABS Take 2 tablets by mouth at bedtime as needed (sleep).     [name]  HYDROmorphone (DILAUDID) 4 MG tablet Take 1 tablet (4 mg total) by mouth every 4 (four) hours as needed for severe pain. (Patient taking differently: Take 4 mg by mouth daily. ) 60 tablet 0  . levothyroxine (SYNTHROID, LEVOTHROID) 88 MCG tablet Take 1 tablet (88 mcg total) by mouth daily before breakfast. 90 tablet 0  . lisinopril-hydrochlorothiazide (ZESTORETIC) 20-12.5 MG per tablet Take 2 tablets by mouth daily. 180 tablet 3  . Multiple Vitamin (MULTIVITAMIN WITH  MINERALS) TABS tablet Take 1 tablet by mouth daily.    [name] omeprazole (PRILOSEC) 20 MG capsule Take 1 capsule (20 mg total) by mouth 2 (two) times daily before a meal. 60 capsule 5  . traMADol (ULTRAM) 50 MG tablet 1-2 po q6h prn pain 60 tablet 0  . [DISCONTINUED] famotidine (PEPCID) 20 MG tablet Take 20 mg by mouth as needed.      No current facility-administered medications for this visit.     Allergies (verified) Tape and Codeine   PAST HISTORY  Family History Family History  Problem Relation Age of Onset  . Heart disease Mother   . Heart failure Father   . Diverticulosis Sister   . Hypertension Brother   . Hypertension Brother   . Heart disease Brother   . Cancer Son     breast  . Heart attack Neg Hx   . Stroke Mother   . Hypertension Mother     Social History History  Substance Use Topics  . Smoking status: Former Smoker    Quit date: 10/06/1963  . Smokeless tobacco: Never Used     Comment: quit 30 years ago  . Alcohol Use: No     Are there smokers in your home (other than you)? No  Risk Factors Current exercise habits: started walking again  Dietary issues discussed: na   Cardiac risk factors: advanced age (older than 67 for men, 46 for women), hypertension and sedentary lifestyle.  Depression Screen (Note: if answer to either of the following is "Yes", a more complete depression screening is indicated)   Over the past two weeks, have you felt down, depressed or hopeless? No  Over the past two weeks, have you felt little interest or pleasure in doing things? No  Have you lost interest or pleasure in daily life? No  Do you often feel hopeless? No  Do you cry easily over simple problems? No  Activities of Daily Living In your present state of health, do you have any difficulty performing the following activities?:  Driving? No Managing money?  No Feeding yourself? No Getting from bed to chair? No Climbing a flight of stairs? No Preparing food and  eating?: No Bathing or showering? No Getting dressed: No Getting to the toilet? No Using the toilet:No Moving around from place to place: No In the past year have you fallen or had a near fall?:No   Are you sexually active?  No  Do you have more than one partner?  No  Hearing Difficulties: No Do you often ask people to speak up or repeat themselves? No Do you experience ringing or noises in your ears? No Do you have difficulty understanding soft or whispered voices? No   Do you feel that you have a problem with memory? No  Do you often misplace items? No  Do you feel safe at home?  Yes  Cognitive Testing  Alert? Yes  Normal Appearance?Yes  Oriented to person? Yes  Place? Yes   Time? Yes  Recall of three  objects?  Yes  Can perform simple calculations? Yes  Displays appropriate judgment?Yes  Can read the correct time from a watch face?Yes   Advanced Directives have been discussed with the patient? Yes  List the Names of Other Physician/Practitioners you currently use: 1.  opth--digby 2.  Ortho-brooks, devenshwar 3. Card--[facility] Indicate any recent Medical Services you may have received from other than [facility] providers in the past year (date may be approximate).  Immunization History  Administered Date(s) Administered  . Influenza Split 04/10/2011, 03/10/2012  . Influenza Whole 03/24/2008, 04/11/2009  . Influenza,inj,Quad PF,36+ Mos 03/24/2013, 03/24/2014  . Pneumococcal Conjugate-13 06/02/2013    Screening Tests Health Maintenance  Topic Date Due  . TETANUS/TDAP  08/01/1957  . ZOSTAVAX  08/01/1998  . PNEUMOCOCCAL POLYSACCHARIDE VACCINE AGE 95 AND OVER  08/02/2003  . INFLUENZA VACCINE  01/30/2015  . COLONOSCOPY  01/20/2023  . DEXA SCAN  Completed    All answers were reviewed with the patient and necessary referrals were made:  Garnet Koyanagi, DO   08/29/2014   History reviewed:  She  has a past medical history of Atrioventricular block, complete; Diastolic  dysfunction; HTN (hypertension); Acute neck pain; Tension headache; Multinodular goiter; Contact dermatitis and eczema; Tooth abscess; Thyroid nodule; Osteoporosis; GERD (gastroesophageal reflux disease); Depression; Lung nodule; Superficial thrombophlebitis; Hypothyroidism; Mitral regurgitation; Chest pain; Pacemaker; and CHF (congestive heart failure). She  does not have any pertinent problems on file. She  has past surgical history that includes laporatomy for ruptured diverticulum with colostomy (2003); take down of colostomy (2004); Exploratory laparotomy; Medtronic pacemaker (05/2003); Hernia repair; Colon surgery; Thyroidectomy (N/A, 08/07/2012); Colonoscopy (01/19/2013); and pacemaker generator change (Left, 09/16/2011). Her family history includes Cancer in her son; Diverticulosis in her sister; Heart disease in her brother and mother; Heart failure in her father; Hypertension in her brother, brother, and mother; Stroke in her mother. There is no history of Heart attack. She  reports that she quit smoking about 50 years ago. She has never used smokeless tobacco. She reports that she does not drink alcohol or use illicit drugs. She has a current medication list which includes the following prescription(s): aspirin, calcium carbonate, chlorpheniramine-apap, denosumab, diphenhydramine-acetaminophen, hydromorphone, levothyroxine, lisinopril-hydrochlorothiazide, multivitamin with minerals, omeprazole, and tramadol. Current Outpatient Prescriptions on File Prior to Visit  Medication Sig Dispense Refill  . aspirin 81 MG chewable tablet Chew 1 tablet (81 mg total) by mouth daily. 30 tablet 0  . calcium carbonate (OS-CAL - DOSED IN MG OF ELEMENTAL CALCIUM) 1250 MG tablet Take 500 mg by mouth 3 (three) times daily.    . Chlorpheniramine-APAP (CORICIDIN) 2-325 MG TABS Take 2 tablets by mouth 2 (two) times daily as needed (allergies).    [name] denosumab (PROLIA) 60 MG/ML SOLN Inject 60 mg into the skin every 6  (six) months. Next injection scheduled for Tuesday, May 12th    . diphenhydramine-acetaminophen (TYLENOL PM) 25-500 MG TABS Take 2 tablets by mouth at bedtime as needed (sleep).     [name] HYDROmorphone (DILAUDID) 4 MG tablet Take 1 tablet (4 mg total) by mouth every 4 (four) hours as needed for severe pain. (Patient taking differently: Take 4 mg by mouth daily. ) 60 tablet 0  . levothyroxine (SYNTHROID, LEVOTHROID) 88 MCG tablet Take 1 tablet (88 mcg total) by mouth daily before breakfast. 90 tablet 0  . Multiple Vitamin (MULTIVITAMIN WITH MINERALS) TABS tablet Take 1 tablet by mouth daily.    [name] omeprazole (PRILOSEC) 20 MG capsule Take 1 capsule (20 mg total) by mouth 2 (  two) times daily before a meal. 60 capsule 5  . [DISCONTINUED] famotidine (PEPCID) 20 MG tablet Take 20 mg by mouth as needed.      No current facility-administered medications on file prior to visit.   She is allergic to tape and codeine.  Review of Systems  Review of Systems  Constitutional: Negative for activity change, appetite change and fatigue.  HENT: Negative for hearing loss, congestion, tinnitus and ear discharge.   Eyes: Negative for visual disturbance (see optho q1y -- vision corrected to 20/20 with glasses).  Respiratory: Negative for cough, chest tightness and shortness of breath.   Cardiovascular: Negative for chest pain, palpitations and leg swelling.  Gastrointestinal: Negative for abdominal pain, diarrhea, constipation and abdominal distention.  Genitourinary: Negative for urgency, frequency, decreased urine volume and difficulty urinating.  Musculoskeletal: Negative for back pain, arthralgias and gait problem.  Skin: Negative for color change, pallor and rash.  Neurological: Negative for dizziness, light-headedness, numbness and headaches.  Hematological: Negative for adenopathy. Does not bruise/bleed easily.  Psychiatric/Behavioral: Negative for suicidal ideas, confusion, sleep disturbance, self-injury,  dysphoric mood, decreased concentration and agitation.  Pt is able to read and write and can do all ADLs No risk for falling No abuse/ violence in home      Objective:     Vision by Snellen chart: opth  Body mass index is 23.28 kg/(m^2). BP 148/92 mmHg  Pulse 86  Temp(Src) 97.5 F (36.4 C) (Oral)  Ht 5\' 3"  (1.6 m)  Wt 131 lb 6.4 oz (59.603 kg)  BMI 23.28 kg/m2  BP 148/92 mmHg  Pulse 86  Temp(Src) 97.5 F (36.4 C) (Oral)  Ht 5\' 3"  (1.6 m)  Wt 131 lb 6.4 oz (59.603 kg)  BMI 23.28 kg/m2 General appearance: alert, cooperative, appears stated age and no distress Head: Normocephalic, without obvious abnormality, atraumatic Eyes: negative findings: lids and lashes normal, conjunctivae and sclerae normal and pupils equal, round, reactive to light and accomodation Ears: normal TM's and external ear canals both ears Nose: Nares normal. Septum midline. Mucosa normal. No drainage or sinus tenderness. Throat: lips, mucosa, and tongue normal; teeth and gums normal Neck: no adenopathy, no carotid bruit, no JVD, supple, symmetrical, trachea midline and thyroid not enlarged, symmetric, no tenderness/mass/nodules Back: symmetric, no curvature. ROM normal. No CVA tenderness. Lungs: clear to auscultation bilaterally Breasts: normal appearance, no masses or tenderness Heart: regular rate and rhythm, S1, S2 normal, no murmur, click, rub or gallop Abdomen: soft, non-tender; bowel sounds normal; no masses,  no organomegaly Pelvic: not indicated; post-menopausal, no abnormal Pap smears in past Extremities: extremities normal, atraumatic, no cyanosis or edema Pulses: 2+ and symmetric Skin: Skin color, texture, turgor normal. No rashes or lesions Lymph nodes: Cervical, supraclavicular, and axillary nodes normal. Neurologic: Alert and oriented X 3, normal strength and tone. Normal symmetric reflexes. Normal coordination and gait psych      Assessment:     cpe      Plan:     During the  course of the visit the patient was educated and counseled about appropriate screening and preventive services including:    Pneumococcal vaccine   Influenza vaccine  Screening mammography  Bone densitometry screening  Colorectal cancer screening  Diabetes screening  Glaucoma screening  Advanced directives: has NO advanced directive  - add't info requested. Referral to SW: pt given info to call SW at [place] review for nutrition referral? Yes ____  Not Indicated __x__   Patient Instructions (the written plan) was given  to the patient.  Medicare Attestation I have personally reviewed: The patient's medical and social history Their use of alcohol, tobacco or illicit drugs Their current medications and supplements The patient's functional ability including ADLs,fall risks, home safety risks, cognitive, and hearing and visual impairment Diet and physical activities Evidence for depression or mood disorders  The patient's weight, height, BMI, and visual acuity have been recorded in the chart.  I have made referrals, counseling, and provided education to the patient based on review of the above and I have provided the patient with a written personalized care plan for preventive services.    1. Essential hypertension  - lisinopril-hydrochlorothiazide (ZESTORETIC) 20-12.5 MG per tablet; Take 2 tablets by mouth daily.  Dispense: 180 tablet; Refill: 3 - Basic metabolic panel; Future - CBC with Differential/Platelet; Future - Hepatic function panel; Future - Lipid panel; Future - POCT urinalysis dipstick; Future  2. Hypothyroidism, unspecified hypothyroidism type   - TSH; Future  3. Back pain, lumbosacral   - traMADol (ULTRAM) 50 MG tablet; 1-2 po q6h prn pain  Dispense: 60 tablet; Refill: 0  4. Bilateral thoracic back pain   - traMADol (ULTRAM) 50 MG tablet; 1-2 po q6h prn pain  Dispense: 60 tablet; Refill: 0  5. Estrogen deficiency    DG Bone Density;  Future  6. Breast cancer screening   - MM DIGITAL SCREENING BILATERAL; Future  7. Encounter for screening mammogram for breast cancer   - MM DIGITAL SCREENING BILATERAL; Future   Garnet Koyanagi, DO   08/29/2014

## 2014-08-29 NOTE — Progress Notes (Signed)
Pre visit review using our clinic review tool, if applicable. No additional management support is needed unless otherwise documented below in the visit note. 

## 2014-08-31 ENCOUNTER — Encounter: Payer: Self-pay | Admitting: Physician Assistant

## 2014-09-01 ENCOUNTER — Other Ambulatory Visit: Payer: Medicare Other

## 2014-09-08 ENCOUNTER — Other Ambulatory Visit (INDEPENDENT_AMBULATORY_CARE_PROVIDER_SITE_OTHER): Payer: Medicare Other

## 2014-09-08 DIAGNOSIS — E039 Hypothyroidism, unspecified: Secondary | ICD-10-CM

## 2014-09-08 DIAGNOSIS — I10 Essential (primary) hypertension: Secondary | ICD-10-CM

## 2014-09-08 LAB — CBC WITH DIFFERENTIAL/PLATELET
BASOS ABS: 0 10*3/uL (ref 0.0–0.1)
BASOS PCT: 0.4 % (ref 0.0–3.0)
Eosinophils Absolute: 0.1 10*3/uL (ref 0.0–0.7)
Eosinophils Relative: 1.4 % (ref 0.0–5.0)
HCT: 37.2 % (ref 36.0–46.0)
HEMOGLOBIN: 12.7 g/dL (ref 12.0–15.0)
LYMPHS ABS: 0.9 10*3/uL (ref 0.7–4.0)
Lymphocytes Relative: 20.4 % (ref 12.0–46.0)
MCHC: 34.2 g/dL (ref 30.0–36.0)
MCV: 80.5 fl (ref 78.0–100.0)
MONO ABS: 0.4 10*3/uL (ref 0.1–1.0)
Monocytes Relative: 9.9 % (ref 3.0–12.0)
NEUTROS ABS: 3.1 10*3/uL (ref 1.4–7.7)
Neutrophils Relative %: 67.9 % (ref 43.0–77.0)
Platelets: 173 10*3/uL (ref 150.0–400.0)
RBC: 4.62 Mil/uL (ref 3.87–5.11)
RDW: 13.1 % (ref 11.5–15.5)
WBC: 4.5 10*3/uL (ref 4.0–10.5)

## 2014-09-08 LAB — HEPATIC FUNCTION PANEL
ALT: 15 U/L (ref 0–35)
AST: 25 U/L (ref 0–37)
Albumin: 4.6 g/dL (ref 3.5–5.2)
Alkaline Phosphatase: 59 U/L (ref 39–117)
BILIRUBIN DIRECT: 0.1 mg/dL (ref 0.0–0.3)
BILIRUBIN TOTAL: 0.7 mg/dL (ref 0.2–1.2)
Total Protein: 7.3 g/dL (ref 6.0–8.3)

## 2014-09-08 LAB — BASIC METABOLIC PANEL
BUN: 18 mg/dL (ref 6–23)
CALCIUM: 8.8 mg/dL (ref 8.4–10.5)
CO2: 28 mEq/L (ref 19–32)
CREATININE: 0.89 mg/dL (ref 0.40–1.20)
Chloride: 99 mEq/L (ref 96–112)
GFR: 79.28 mL/min (ref >60.00)
GLUCOSE: 90 mg/dL (ref 70–99)
POTASSIUM: 3.9 meq/L (ref 3.5–5.1)
Sodium: 133 mEq/L — ABNORMAL LOW (ref 135–145)

## 2014-09-08 LAB — LIPID PANEL
CHOL/HDL RATIO: 3
CHOLESTEROL: 168 mg/dL (ref 0–200)
HDL: 50.8 mg/dL (ref >39.00)
LDL Cholesterol: 105 mg/dL — ABNORMAL HIGH (ref 0–99)
NonHDL: 117.2
Triglycerides: 59 mg/dL (ref 0.0–149.0)
VLDL: 11.8 mg/dL (ref 0.0–40.0)

## 2014-09-08 LAB — TSH: TSH: 2.33 u[IU]/mL (ref 0.35–4.50)

## 2014-09-19 ENCOUNTER — Inpatient Hospital Stay: Admission: RE | Admit: 2014-09-19 | Payer: Medicare Other | Source: Ambulatory Visit

## 2014-09-20 ENCOUNTER — Ambulatory Visit (HOSPITAL_BASED_OUTPATIENT_CLINIC_OR_DEPARTMENT_OTHER): Payer: Medicare Other

## 2014-10-03 ENCOUNTER — Encounter: Payer: Medicare Other | Admitting: Nurse Practitioner

## 2014-10-04 ENCOUNTER — Encounter: Payer: Self-pay | Admitting: Nurse Practitioner

## 2014-10-04 ENCOUNTER — Encounter: Payer: Self-pay | Admitting: Family Medicine

## 2014-10-04 DIAGNOSIS — E890 Postprocedural hypothyroidism: Secondary | ICD-10-CM

## 2014-10-04 MED ORDER — LEVOTHYROXINE SODIUM 88 MCG PO TABS: 88.0000 ug | ORAL_TABLET | Freq: Every day | ORAL | Status: DC

## 2014-10-04 NOTE — Progress Notes (Signed)
Electrophysiology Office Note Date: 10/05/2014  ID:  [name], [name] [birth year], MRN [id number]  PCP: Garnet Koyanagi, DO Primary Cardiologist: Burt Knack Electrophysiologist: Caryl Comes  CC: Pacemaker and hypertension follow-up  [name] is a 74 y.o. female is seen today for Dr Caryl Comes. She presents today for routine electrophysiology followup.  Since last being seen in our clinic, the patient reports doing very well.  She denies chest pain, palpitations, PND, orthopnea, nausea, vomiting, dizziness, syncope.  She reports recent increased shortness of breath with exertion as well as periods of "flushing" that lasts minutes but is not associated with syncope or pre-syncope.   Her Amlodipine was discontinued due to concerns about LE edema which has resolved.   Device History: MDT dual chamber PPM implanted 2004 for CHB with generator change 2013.     Past Medical History  Diagnosis Date  . Atrioventricular block, complete     a. 08/2011 Upgrage of PPM to MDT Adapta L Dual Chamber PPM ser # [id number].  . Diastolic dysfunction   . HTN (hypertension)   . Tension headache   . Multinodular goiter   . Contact dermatitis and eczema     unspec cause  . Tooth abscess   . Osteoporosis   . GERD (gastroesophageal reflux disease)   . Depression   . Lung nodule   . Superficial thrombophlebitis   . Hypothyroidism   . Mitral regurgitation     a.03/2012 Echo: EF 65-70%, mod MR  . Chest pain     a. 09/2007 Myoview: EF 77%, no ischemia/infarct.   Past Surgical History  Procedure Laterality Date  . Laporatomy for ruptured diverticulum with colostomy  2003  . Take down of colostomy  2004  . Exploratory laparotomy    . Hernia repair    . Colon surgery    . Thyroidectomy N/A 08/07/2012    Procedure: THYROIDECTOMY;  Surgeon: Earnstine Regal, MD;  Location: [facility];  Service: General;  Laterality: N/A;  . Colonoscopy  01/19/2013    colonoscopy  . Pacemaker generator change Left 09/16/2011    MDT  ADDRL1 pacemaker    Current Outpatient Prescriptions  Medication Sig Dispense Refill  . aspirin 81 MG chewable tablet Chew 1 tablet (81 mg total) by mouth daily. 30 tablet 0  . calcium carbonate (OS-CAL - DOSED IN MG OF ELEMENTAL CALCIUM) 1250 MG tablet Take 500 mg by mouth 3 (three) times daily.    . Chlorpheniramine-APAP (CORICIDIN) 2-325 MG TABS Take 2 tablets by mouth 2 (two) times daily as needed (allergies).    [name] denosumab (PROLIA) 60 MG/ML SOLN Inject 60 mg into the skin every 6 (six) months. Next injection scheduled for Tuesday, May 12th    . diphenhydramine-acetaminophen (TYLENOL PM) 25-500 MG TABS Take 2 tablets by mouth at bedtime as needed (sleep).     [name] HYDROmorphone (DILAUDID) 4 MG tablet Take 1 tablet (4 mg total) by mouth every 4 (four) hours as needed for severe pain. (Patient taking differently: Take 4 mg by mouth daily. ) 60 tablet 0  . levothyroxine (SYNTHROID, LEVOTHROID) 88 MCG tablet Take 1 tablet (88 mcg total) by mouth daily before breakfast. 90 tablet 3  . lisinopril-hydrochlorothiazide (ZESTORETIC) 20-12.5 MG per tablet Take 2 tablets by mouth daily. 180 tablet 3  . Multiple Vitamin (MULTIVITAMIN WITH MINERALS) TABS tablet Take 1 tablet by mouth daily.    [name] omeprazole (PRILOSEC) 20 MG capsule Take 1 capsule (20 mg total) by mouth 2 (two) times daily before a  meal. 60 capsule 5  . traMADol (ULTRAM) 50 MG tablet 1-2 po q6h prn pain 60 tablet 0  . [DISCONTINUED] famotidine (PEPCID) 20 MG tablet Take 20 mg by mouth as needed.      No current facility-administered medications for this visit.    Allergies:   Tape and Codeine   Social History: History   Social History  . Marital Status: Single    Spouse Name: N/A  . Number of Children: 2  . Years of Education: 16   Occupational History  . Retired Production assistant, radio for the [employer]   .     Social History Main Topics  . Smoking status: Former Smoker    Quit date: 10/06/1963  . Smokeless tobacco: Never  Used     Comment: quit 30 years ago  . Alcohol Use: No  . Drug Use: No  . Sexual Activity: No   Other Topics Concern  . Not on file   Social History Narrative   The patient is a Writer of [employer].  She worked for the [employer] in [employer].  She was married for 22 years, divorced, and has remained single. She has 1 grown son, 1 grown daughter.  She has 3 grandchildren, 2 of her children   live in [place].  She is retired.  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    Family History: Family History  Problem Relation Age of Onset  . Heart disease Mother   . Heart failure Father   . Diverticulosis Sister   . Hypertension Brother   . Hypertension Brother   . Heart disease Brother   . Cancer Son     breast  . Heart attack Neg Hx   . Stroke Mother   . Hypertension Mother      Review of Systems: All other systems reviewed and are otherwise negative except as noted above.   Physical Exam: VS:  BP 118/90 mmHg  Pulse 88  Ht 5\' 3"  (1.6 m)  Wt 126 lb 6.4 oz (57.335 kg)  BMI 22.40 kg/m2 , BMI Body mass index is 22.4 kg/(m^2).  GEN- The patient is well appearing, alert and oriented x 3 today.   HEENT: normocephalic, atraumatic; sclera clear, conjunctiva pink; hearing intact; oropharynx clear; neck supple, no JVP Lymph- no cervical lymphadenopathy Lungs- Clear to ausculation bilaterally, normal work of breathing.  No wheezes, rales, rhonchi Heart- Regular rate and rhythm, no murmurs, rubs or gallops  GI- soft, non-tender, non-distended, bowel sounds present  Extremities- no clubbing, cyanosis, or edema  MS- no significant deformity or atrophy Skin- warm and dry, no rash or lesion; PPM pocket well healed Psych- euthymic mood, full affect Neuro- strength and sensation are intact  PPM Interrogation- reviewed in detail today,  See PACEART report  EKG:  EKG is  not ordered today.   Recent Labs: 09/08/2014: ALT 15; BUN 18; Creatinine 0.89; Hemoglobin 12.7; Platelets 173.0; Potassium 3.9; Sodium 133*; TSH 2.33   Wt Readings from Last 3 Encounters:  10/05/14 126 lb 6.4 oz (57.335 kg)  08/29/14 131 lb 6.4 oz (59.603 kg)  07/20/14 129 lb (58.514 kg)     Assessment and Plan:  1.  Complete heart block Normal PPM function See Pace Art report No changes today  2.  HTN Continue Lisinopril/HCTZ  Stable No change required today  3.  Shortness of breath  Will check echo at this time w 100% RV pacing Last echo 2013 with normal LV function Myoview 2009 with no ischemia  4.  Paroxysmal atrial fibrillation Identified on device interrogation today All episodes <10 minutes Will follow burden remotely If AF burden increases, will need to consider anticoagulation for CHADS2VASC score of 4   Current medicines are reviewed at length with the patient today.   The patient does not have concerns regarding her medicines.  The following changes were made today:  none  Labs/ tests ordered today include:  Orders Placed This Encounter  Procedures  . 2D Echocardiogram without contrast     Disposition:   Follow up with [facility] every 3 months (enrolled in [facility] today), follow up with Dr Caryl Comes October as scheduled   Signed, Chanetta Marshall, NP 10/05/2014 1:54 PM  [address] [place] [address] [phone] (office) [phone] (fax)

## 2014-10-05 ENCOUNTER — Encounter: Payer: Self-pay | Admitting: Nurse Practitioner

## 2014-10-05 ENCOUNTER — Ambulatory Visit: Payer: Medicare Other | Admitting: Physician Assistant

## 2014-10-05 ENCOUNTER — Ambulatory Visit (INDEPENDENT_AMBULATORY_CARE_PROVIDER_SITE_OTHER): Payer: Medicare Other | Admitting: Nurse Practitioner

## 2014-10-05 VITALS — BP 118/90 | HR 88 | Ht 63.0 in | Wt 126.4 lb

## 2014-10-05 DIAGNOSIS — I480 Paroxysmal atrial fibrillation: Secondary | ICD-10-CM | POA: Diagnosis not present

## 2014-10-05 DIAGNOSIS — R0600 Dyspnea, unspecified: Secondary | ICD-10-CM | POA: Diagnosis not present

## 2014-10-05 DIAGNOSIS — I442 Atrioventricular block, complete: Secondary | ICD-10-CM | POA: Diagnosis not present

## 2014-10-05 DIAGNOSIS — I10 Essential (primary) hypertension: Secondary | ICD-10-CM

## 2014-10-05 LAB — MDC_IDC_ENUM_SESS_TYPE_INCLINIC
Battery Voltage: 2.79 V
Lead Channel Impedance Value: 551 Ohm
Lead Channel Impedance Value: 591 Ohm
Lead Channel Setting Sensing Sensitivity: 2.8 mV
MDC IDC SET LEADCHNL RA PACING AMPLITUDE: 2 V
MDC IDC SET LEADCHNL RV PACING AMPLITUDE: 2.5 V
MDC IDC SET LEADCHNL RV PACING PULSEWIDTH: 0.4 ms

## 2014-10-05 MED ORDER — LISINOPRIL-HYDROCHLOROTHIAZIDE 20-12.5 MG PO TABS: 2.0000 | ORAL_TABLET | Freq: Every day | ORAL | Status: DC

## 2014-10-05 NOTE — Addendum Note (Signed)
Addended by: Chanetta Marshall K on: 10/05/2014 02:12 PM   Modules accepted: Orders

## 2014-10-05 NOTE — Patient Instructions (Addendum)
Your physician has requested that you have an echocardiogram. Echocardiography is a painless test that uses sound waves to create images of your heart. It provides your doctor with information about the size and shape of your heart and how well your heart's chambers and valves are working. This procedure takes approximately one hour. There are no restrictions for this procedure.  Remote monitoring is used to monitor your Pacemaker of ICD from home. This monitoring reduces the number of office visits required to check your device to one time per year. It allows [place] to keep an eye on the functioning of your device to ensure it is working properly. You are scheduled for a device check from home on 01/04/15. You may send your transmission at any time that day. If you have a wireless device, the transmission will be sent automatically. After your physician reviews your transmission, you will receive a postcard with your next transmission date.  Your physician wants you to follow-up in: October 2016 with Dr. Caryl Comes. You will receive a reminder letter in the mail two months in advance. If you don't receive a letter, please call our office to schedule the follow-up appointment.

## 2014-10-12 ENCOUNTER — Ambulatory Visit (HOSPITAL_COMMUNITY): Payer: Medicare Other | Attending: Nurse Practitioner | Admitting: Radiology

## 2014-10-12 DIAGNOSIS — R0600 Dyspnea, unspecified: Secondary | ICD-10-CM | POA: Insufficient documentation

## 2014-10-12 HISTORY — PX: TRANSTHORACIC ECHOCARDIOGRAM: SHX275

## 2014-10-12 NOTE — Progress Notes (Signed)
Echocardiogram performed.  

## 2014-10-16 ENCOUNTER — Encounter: Payer: Self-pay | Admitting: Family Medicine

## 2014-10-16 DIAGNOSIS — M545 Low back pain, unspecified: Secondary | ICD-10-CM

## 2014-10-16 DIAGNOSIS — M546 Pain in thoracic spine: Secondary | ICD-10-CM

## 2014-10-17 MED ORDER — TRAMADOL HCL 50 MG PO TABS: ORAL_TABLET | ORAL | Status: DC

## 2014-10-17 NOTE — Telephone Encounter (Signed)
Tramadol refill  Last seen and filled 08/29/14 no refills UDS none on file, No contract on file   Please advise      KP

## 2014-10-17 NOTE — Telephone Encounter (Signed)
Refill x1 

## 2014-10-31 ENCOUNTER — Encounter: Payer: Self-pay | Admitting: Internal Medicine

## 2014-12-21 ENCOUNTER — Telehealth: Payer: Self-pay

## 2014-12-21 NOTE — Telephone Encounter (Signed)
Prolia benefits Verified. Benefits subject to a $166 deductible. Deductible of $166 met and there is a 20% co-insurance for the administration and cost of Prolia. No PA required.  Secondary insurance: the secondary plan will coordinate benefits subject to a $300 deductible $300 met once the deductible has been met, the plan will consider 100% of the medicare part B co-insurance up to the primary allowable. This plan does not cover the medicare part B deductible. Patient effectively has a 0% co-insurance with coordination at time of insurance verification. The secondary plan does follow medicare guidelines. Claims will crossover. No PA needed.

## 2014-12-22 ENCOUNTER — Other Ambulatory Visit: Payer: Self-pay | Admitting: Family Medicine

## 2014-12-22 DIAGNOSIS — M545 Low back pain, unspecified: Secondary | ICD-10-CM

## 2014-12-22 DIAGNOSIS — M546 Pain in thoracic spine: Secondary | ICD-10-CM

## 2014-12-22 MED ORDER — TRAMADOL HCL 50 MG PO TABS: ORAL_TABLET | ORAL | Status: DC

## 2014-12-22 NOTE — Telephone Encounter (Signed)
Last seen 08/29/14 and filled 10/17/14 #60 No UDS.  Please advise     KP

## 2014-12-27 NOTE — Telephone Encounter (Signed)
Tiffany would you schedule this patient for a Prolia injection.      KP

## 2014-12-28 ENCOUNTER — Encounter: Payer: Self-pay | Admitting: Family Medicine

## 2015-01-04 ENCOUNTER — Encounter: Payer: Self-pay | Admitting: Internal Medicine

## 2015-01-04 ENCOUNTER — Ambulatory Visit (INDEPENDENT_AMBULATORY_CARE_PROVIDER_SITE_OTHER): Payer: Medicare Other | Admitting: *Deleted

## 2015-01-04 ENCOUNTER — Telehealth: Payer: Self-pay | Admitting: Cardiology

## 2015-01-04 DIAGNOSIS — I442 Atrioventricular block, complete: Secondary | ICD-10-CM

## 2015-01-04 LAB — CUP PACEART REMOTE DEVICE CHECK
Battery Voltage: 2.78 V
Brady Statistic AP VP Percent: 1 %
Brady Statistic AP VS Percent: 0 %
Brady Statistic AS VS Percent: 0 %
Date Time Interrogation Session: 20160706152312
Lead Channel Impedance Value: 542 Ohm
Lead Channel Impedance Value: 591 Ohm
Lead Channel Pacing Threshold Amplitude: 0.5 V
Lead Channel Pacing Threshold Amplitude: 1.125 V
Lead Channel Pacing Threshold Pulse Width: 0.4 ms
Lead Channel Pacing Threshold Pulse Width: 0.4 ms
Lead Channel Setting Pacing Amplitude: 2 V
Lead Channel Setting Pacing Amplitude: 2.5 V
Lead Channel Setting Pacing Pulse Width: 0.4 ms
Lead Channel Setting Sensing Sensitivity: 2.8 mV
MDC IDC MSMT BATTERY IMPEDANCE: 204 Ohm
MDC IDC MSMT BATTERY REMAINING LONGEVITY: 116 mo
MDC IDC MSMT LEADCHNL RA SENSING INTR AMPL: 1 mV
MDC IDC STAT BRADY AS VP PERCENT: 99 %

## 2015-01-04 NOTE — Telephone Encounter (Signed)
LMOVM reminding pt to send remote transmission.   

## 2015-01-04 NOTE — Progress Notes (Signed)
Remote pacemaker transmission.   

## 2015-01-12 ENCOUNTER — Ambulatory Visit (INDEPENDENT_AMBULATORY_CARE_PROVIDER_SITE_OTHER): Payer: Medicare Other | Admitting: *Deleted

## 2015-01-12 DIAGNOSIS — M810 Age-related osteoporosis without current pathological fracture: Secondary | ICD-10-CM | POA: Diagnosis not present

## 2015-01-12 MED ORDER — DENOSUMAB 60 MG/ML ~~LOC~~ SOLN
60.0000 mg | Freq: Once | SUBCUTANEOUS | Status: AC
  Administered 2015-01-12: 60 mg via SUBCUTANEOUS

## 2015-01-12 NOTE — Progress Notes (Signed)
Pre visit review using our clinic review tool, if applicable. No additional management support is needed unless otherwise documented below in the visit note.  Patient tolerated injection well. No signs or symptoms of a reaction prior to leaving the office.

## 2015-02-03 ENCOUNTER — Encounter: Payer: Self-pay | Admitting: Cardiology

## 2015-02-28 ENCOUNTER — Encounter: Payer: Self-pay | Admitting: Family Medicine

## 2015-02-28 ENCOUNTER — Ambulatory Visit (INDEPENDENT_AMBULATORY_CARE_PROVIDER_SITE_OTHER): Payer: Medicare Other | Admitting: Family Medicine

## 2015-02-28 VITALS — BP 122/86 | HR 72 | Temp 98.2°F | Ht 63.0 in | Wt 135.4 lb

## 2015-02-28 DIAGNOSIS — I10 Essential (primary) hypertension: Secondary | ICD-10-CM

## 2015-02-28 DIAGNOSIS — M545 Low back pain, unspecified: Secondary | ICD-10-CM

## 2015-02-28 DIAGNOSIS — M546 Pain in thoracic spine: Secondary | ICD-10-CM | POA: Diagnosis not present

## 2015-02-28 DIAGNOSIS — J069 Acute upper respiratory infection, unspecified: Secondary | ICD-10-CM | POA: Diagnosis not present

## 2015-02-28 DIAGNOSIS — M5489 Other dorsalgia: Secondary | ICD-10-CM | POA: Diagnosis not present

## 2015-02-28 LAB — HEPATIC FUNCTION PANEL
ALK PHOS: 44 U/L (ref 39–117)
ALT: 13 U/L (ref 0–35)
AST: 21 U/L (ref 0–37)
Albumin: 4.2 g/dL (ref 3.5–5.2)
Bilirubin, Direct: 0.1 mg/dL (ref 0.0–0.3)
Total Bilirubin: 0.5 mg/dL (ref 0.2–1.2)
Total Protein: 7.1 g/dL (ref 6.0–8.3)

## 2015-02-28 LAB — BASIC METABOLIC PANEL
BUN: 29 mg/dL — ABNORMAL HIGH (ref 6–23)
CALCIUM: 9.7 mg/dL (ref 8.4–10.5)
CO2: 30 meq/L (ref 19–32)
CREATININE: 1.2 mg/dL (ref 0.40–1.20)
Chloride: 97 mEq/L (ref 96–112)
GFR: 56.09 mL/min — ABNORMAL LOW (ref >60.00)
GLUCOSE: 88 mg/dL (ref 70–99)
Potassium: 4 mEq/L (ref 3.5–5.1)
SODIUM: 133 meq/L — AB (ref 135–145)

## 2015-02-28 LAB — LIPID PANEL
Cholesterol: 162 mg/dL (ref 0–200)
HDL: 46.5 mg/dL (ref >39.00)
LDL Cholesterol: 94 mg/dL (ref 0–99)
NonHDL: 115.29
Total CHOL/HDL Ratio: 3
Triglycerides: 104 mg/dL (ref 0.0–149.0)
VLDL: 20.8 mg/dL (ref 0.0–40.0)

## 2015-02-28 MED ORDER — TRAMADOL HCL 50 MG PO TABS: ORAL_TABLET | ORAL | Status: DC

## 2015-02-28 MED ORDER — FLUTICASONE PROPIONATE 50 MCG/ACT NA SUSP: 2.0000 | Freq: Every day | NASAL | Status: DC

## 2015-02-28 NOTE — Progress Notes (Signed)
Patient ID: [name], female    DOB: 1940/10/28  Age: 73 y.o. MRN: [id number]    Subjective:  Subjective HPI [name] presents for bp f/u and labs.  She is also c/o sinus congestion and mucus production.  She is taking claritin prn and coricidin---with little relief.    Review of Systems  Constitutional: Negative for diaphoresis, appetite change, fatigue and unexpected weight change.  HENT: Positive for congestion, postnasal drip and sinus pressure. Negative for sore throat.   Eyes: Negative for pain, redness and visual disturbance.  Respiratory: Positive for cough. Negative for chest tightness, shortness of breath and wheezing.   Cardiovascular: Negative for chest pain, palpitations and leg swelling.  Endocrine: Negative for cold intolerance, heat intolerance, polydipsia, polyphagia and polyuria.  Genitourinary: Negative for dysuria, frequency and difficulty urinating.  Neurological: Negative for dizziness, light-headedness, numbness and headaches.    History Past Medical History  Diagnosis Date  . Atrioventricular block, complete     a. 08/2011 Upgrage of PPM to MDT Adapta L Dual Chamber PPM ser # [id number] H.  . Diastolic dysfunction   . HTN (hypertension)   . Tension headache   . Multinodular goiter   . Contact dermatitis and eczema     unspec cause  . Tooth abscess   . Osteoporosis   . GERD (gastroesophageal reflux disease)   . Depression   . Lung nodule   . Superficial thrombophlebitis   . Hypothyroidism   . Mitral regurgitation     a.03/2012 Echo: EF 65-70%, mod MR  . Chest pain     a. 09/2007 Myoview: EF 77%, no ischemia/infarct.    She has past surgical history that includes laporatomy for ruptured diverticulum with colostomy (2003); take down of colostomy (2004); Exploratory laparotomy; Hernia repair; Colon surgery; Thyroidectomy (N/A, 08/07/2012); Colonoscopy (01/19/2013); and pacemaker generator change (Left, 09/16/2011).   Her family history includes  Cancer in her son; Diverticulosis in her sister; Heart disease in her brother and mother; Heart failure in her father; Hypertension in her brother, brother, and mother; Stroke in her mother. There is no history of Heart attack.She reports that she quit smoking about 51 years ago. She has never used smokeless tobacco. She reports that she does not drink alcohol or use illicit drugs.  Current Outpatient Prescriptions on File Prior to Visit  Medication Sig Dispense Refill  . aspirin 81 MG chewable tablet Chew 1 tablet (81 mg total) by mouth daily. 30 tablet 0  . calcium carbonate (OS-CAL - DOSED IN MG OF ELEMENTAL CALCIUM) 1250 MG tablet Take 500 mg by mouth 3 (three) times daily.    . Chlorpheniramine-APAP (CORICIDIN) 2-325 MG TABS Take 2 tablets by mouth 2 (two) times daily as needed (allergies).    [name] denosumab (PROLIA) 60 MG/ML SOLN Inject 60 mg into the skin every 6 (six) months. Next injection scheduled for Tuesday, May 12th    . diphenhydramine-acetaminophen (TYLENOL PM) 25-500 MG TABS Take 2 tablets by mouth at bedtime as needed (sleep).     [name] levothyroxine (SYNTHROID, LEVOTHROID) 88 MCG tablet Take 1 tablet (88 mcg total) by mouth daily before breakfast. 90 tablet 3  . lisinopril-hydrochlorothiazide (ZESTORETIC) 20-12.5 MG per tablet Take 2 tablets by mouth daily. 180 tablet 3  . Multiple Vitamin (MULTIVITAMIN WITH MINERALS) TABS tablet Take 1 tablet by mouth daily.    [name] omeprazole (PRILOSEC) 20 MG capsule Take 1 capsule (20 mg total) by mouth 2 (two) times daily before a meal. 60 capsule 5  . [  DISCONTINUED] famotidine (PEPCID) 20 MG tablet Take 20 mg by mouth as needed.      No current facility-administered medications on file prior to visit.     Objective:  Objective Physical Exam  Constitutional: She is oriented to person, place, and time. She appears well-developed and well-nourished.  HENT:  Head: Normocephalic and atraumatic.  Right Ear: Hearing and external ear normal.  Left Ear:  External ear normal.  Nose: Mucosal edema and rhinorrhea present. No nose lacerations, sinus tenderness, nasal deformity, septal deviation or nasal septal hematoma. No epistaxis.  No foreign bodies. Right sinus exhibits maxillary sinus tenderness and frontal sinus tenderness. Left sinus exhibits maxillary sinus tenderness and frontal sinus tenderness.  Mouth/Throat: Posterior oropharyngeal erythema present. No oropharyngeal exudate or posterior oropharyngeal edema.  + PND + errythema  Eyes: Conjunctivae and EOM are normal. Right eye exhibits no discharge. Left eye exhibits no discharge.  Neck: Normal range of motion. Neck supple. No JVD present. Carotid bruit is not present. No thyromegaly present.  Cardiovascular: Normal rate, regular rhythm and normal heart sounds.   No murmur heard. Pulmonary/Chest: Effort normal and breath sounds normal. No respiratory distress. She has no wheezes. She has no rales. She exhibits no tenderness.  Musculoskeletal: She exhibits no edema.  Lymphadenopathy:    She has cervical adenopathy.  Neurological: She is alert and oriented to person, place, and time.  Psychiatric: She has a normal mood and affect. Her behavior is normal. Judgment and thought content normal.   BP 122/86 mmHg  Pulse 72  Temp(Src) 98.2 F (36.8 C) (Oral)  Ht 5\' 3"  (1.6 m)  Wt 135 lb 6.4 oz (61.417 kg)  BMI 23.99 kg/m2  SpO2 96% Wt Readings from Last 3 Encounters:  02/28/15 135 lb 6.4 oz (61.417 kg)  10/05/14 126 lb 6.4 oz (57.335 kg)  08/29/14 131 lb 6.4 oz (59.603 kg)     Lab Results  Component Value Date   WBC 4.5 09/08/2014   HGB 12.7 09/08/2014   HCT 37.2 09/08/2014   PLT 173.0 09/08/2014   GLUCOSE 88 02/28/2015   CHOL 162 02/28/2015   TRIG 104.0 02/28/2015   HDL 46.50 02/28/2015   LDLCALC 94 02/28/2015   ALT 13 02/28/2015   AST 21 02/28/2015   NA 133* 02/28/2015   K 4.0 02/28/2015   CL 97 02/28/2015   CREATININE 1.20 02/28/2015   BUN 29* 02/28/2015   CO2 30  02/28/2015   TSH 2.33 09/08/2014   INR 1.01 11/04/2013   HGBA1C 5.4 08/04/2014    Ct Head Wo Contrast  03/04/2014   CLINICAL DATA:  Left-sided facial numbness.  EXAM: CT HEAD WITHOUT CONTRAST  TECHNIQUE: Contiguous axial images were obtained from the base of the skull through the vertex without intravenous contrast.  COMPARISON:  None.  FINDINGS: There is no intra or extra-axial fluid collection or mass lesion. The basilar cisterns and ventricles have a normal appearance. There is no CT evidence for acute infarction or hemorrhage. Mastoid air cells and paranasal sinuses are unremarkable. Calvarium intact.  IMPRESSION: No acute intracranial process   Electronically Signed   By: Lovey Newcomer M.D.   On: 03/04/2014 12:04     Assessment & Plan:  Plan I have discontinued Ms. Sanroman's HYDROmorphone. I am also having her start on fluticasone. Additionally, I am having her maintain her denosumab, diphenhydramine-acetaminophen, calcium carbonate, Chlorpheniramine-APAP, multivitamin with minerals, omeprazole, aspirin, levothyroxine, lisinopril-hydrochlorothiazide, and traMADol.  Meds ordered this encounter  Medications  . traMADol (ULTRAM) 50 MG tablet  Sig: 1-2 po q6h prn pain    Dispense:  90 tablet    Refill:  0  . fluticasone (FLONASE) 50 MCG/ACT nasal spray    Sig: Place 2 sprays into both nostrils daily.    Dispense:  16 g    Refill:  6    Problem List Items Addressed This Visit    None    Visit Diagnoses    Back pain, lumbosacral    -  Primary    Relevant Medications    traMADol (ULTRAM) 50 MG tablet    Bilateral thoracic back pain        Relevant Medications    traMADol (ULTRAM) 50 MG tablet    Acute upper respiratory infection        Relevant Medications    fluticasone (FLONASE) 50 MCG/ACT nasal spray    Essential hypertension        Relevant Orders    Basic metabolic panel (Completed)    Hepatic function panel (Completed)    Lipid panel (Completed)       Follow-up: Return  in about 6 months (around 08/29/2015), or if symptoms worsen or fail to improve, for annual exam, fasting.  Garnet Koyanagi, DO

## 2015-02-28 NOTE — Progress Notes (Signed)
Pre visit review using our clinic review tool, if applicable. No additional management support is needed unless otherwise documented below in the visit note. 

## 2015-02-28 NOTE — Patient Instructions (Addendum)

## 2015-03-06 ENCOUNTER — Encounter: Payer: Self-pay | Admitting: Family Medicine

## 2015-04-04 ENCOUNTER — Ambulatory Visit (INDEPENDENT_AMBULATORY_CARE_PROVIDER_SITE_OTHER): Payer: Medicare Other

## 2015-04-04 DIAGNOSIS — Z23 Encounter for immunization: Secondary | ICD-10-CM

## 2015-04-07 NOTE — Progress Notes (Signed)
Patient Care Team: Rosalita Chessman, DO as PCP - General (Family Medicine) Patsey Berthold, NP as Nurse Practitioner (Cardiology)   HPI  [name] is a 74 y.o. female Seen in followup for a pacemaker implanted for high-grade heart block which reached ERI 3/13 at that time underwent generator replacement.  Because of some dypsnea she underwent echo >>4/16 normal LV function  Renal function Cr 0.7  9/15  Device interrogation has demonstrated atrial high rate episodes consisted by electrograms with atrial fibrillation/flutter. The longest duration was one hour  She also has an episode of nonsustained ventricular tachycardia    Past Medical History  Diagnosis Date  . Atrioventricular block, complete     a. 08/2011 Upgrage of PPM to MDT Adapta L Dual Chamber PPM ser # [id number] H.  . Diastolic dysfunction   . HTN (hypertension)   . Tension headache   . Multinodular goiter   . Contact dermatitis and eczema     unspec cause  . Tooth abscess   . Osteoporosis   . GERD (gastroesophageal reflux disease)   . Depression   . Lung nodule   . Superficial thrombophlebitis   . Hypothyroidism   . Mitral regurgitation     a.03/2012 Echo: EF 65-70%, mod MR  . Chest pain     a. 09/2007 Myoview: EF 77%, no ischemia/infarct.    Past Surgical History  Procedure Laterality Date  . Laporatomy for ruptured diverticulum with colostomy  2003  . Take down of colostomy  2004  . Exploratory laparotomy    . Hernia repair    . Colon surgery    . Thyroidectomy N/A 08/07/2012    Procedure: THYROIDECTOMY;  Surgeon: Earnstine Regal, MD;  Location: [facility];  Service: General;  Laterality: N/A;  . Colonoscopy  01/19/2013    colonoscopy  . Pacemaker generator change Left 09/16/2011    MDT ADDRL1 pacemaker    Current Outpatient Prescriptions  Medication Sig Dispense Refill  . aspirin 81 MG chewable tablet Chew 1 tablet (81 mg total) by mouth daily. 30 tablet 0  . calcium carbonate (OS-CAL - DOSED IN MG OF  ELEMENTAL CALCIUM) 1250 MG tablet Take 500 mg by mouth 3 (three) times daily.    . Chlorpheniramine-APAP (CORICIDIN) 2-325 MG TABS Take 2 tablets by mouth 2 (two) times daily as needed (allergies).    [name] denosumab (PROLIA) 60 MG/ML SOLN Inject 60 mg into the skin every 6 (six) months. Next injection scheduled for Tuesday, May 12th    . diphenhydramine-acetaminophen (TYLENOL PM) 25-500 MG TABS Take 2 tablets by mouth at bedtime as needed (sleep).     . fluticasone (FLONASE) 50 MCG/ACT nasal spray Place 2 sprays into both nostrils daily. 16 g 6  . levothyroxine (SYNTHROID, LEVOTHROID) 88 MCG tablet Take 1 tablet (88 mcg total) by mouth daily before breakfast. 90 tablet 3  . lisinopril-hydrochlorothiazide (ZESTORETIC) 20-12.5 MG per tablet Take 2 tablets by mouth daily. 180 tablet 3  . Multiple Vitamin (MULTIVITAMIN WITH MINERALS) TABS tablet Take 1 tablet by mouth daily.    [name] omeprazole (PRILOSEC) 20 MG capsule Take 1 capsule (20 mg total) by mouth 2 (two) times daily before a meal. 60 capsule 5  . traMADol (ULTRAM) 50 MG tablet 1-2 po q6h prn pain 90 tablet 0  . [DISCONTINUED] famotidine (PEPCID) 20 MG tablet Take 20 mg by mouth as needed.      No current facility-administered medications for this visit.    Allergies  Allergen Reactions  .  Tape Rash  . Codeine Nausea And Vomiting    Review of Systems negative except from HPI and PMH  Physical Exam There were no vitals taken for this visit. Well developed and well nourished in no acute distress HENT normal E scleral and icterus clear Neck Supple; scar JVP flat; carotids brisk and full Clear to ausculation Device pocket well healed; without hematoma or erythema Regular rate and rhythm, no murmurs gallops or rub Soft with active bowel sounds No clubbing cyanosis none Edema Alert and oriented, grossly normal motor and sensory function Skin Warm and Dry  ECG demonstrates sinus rhythm at about 80 with P synchronous pacing and  intermittent failure to pace on the ventricular channel  Assessment and  Plan  Complete heart block   Ventricular failureto pace  lung nodules  Dyspnea on exertion  Hypertension  Ventricular tachycardia   Pacemaker-Medtronic  The patient's device was interrogated.  The information was reviewed. No changes were made in the programming.     Her blood pressure significantly elevated. We reviewed the importance of trying to get it down; we'll add amlodipine and have reviewed the side effects.  Electrocardiogram demonstrates ventricular failure to pace. It is thought that this is potentially related to sensing assurance; in this patient with complete heart block and was inactivated.  She has a history of lung nodules are not followed in some time; we'll undertake a CT scan to review these.  She has dyspnea on exertion  echocardiogram in the spring was relatively normal; we will undertake a Myoview scan.  NSVT  Will check myoview

## 2015-04-11 ENCOUNTER — Encounter: Payer: Self-pay | Admitting: Internal Medicine

## 2015-04-11 ENCOUNTER — Ambulatory Visit (INDEPENDENT_AMBULATORY_CARE_PROVIDER_SITE_OTHER): Payer: Medicare Other | Admitting: Internal Medicine

## 2015-04-11 VITALS — BP 152/98 | HR 52 | Ht 63.0 in | Wt 133.6 lb

## 2015-04-11 DIAGNOSIS — Z950 Presence of cardiac pacemaker: Secondary | ICD-10-CM

## 2015-04-11 DIAGNOSIS — I442 Atrioventricular block, complete: Secondary | ICD-10-CM | POA: Diagnosis not present

## 2015-04-11 DIAGNOSIS — R918 Other nonspecific abnormal finding of lung field: Secondary | ICD-10-CM | POA: Diagnosis not present

## 2015-04-11 DIAGNOSIS — R0609 Other forms of dyspnea: Secondary | ICD-10-CM | POA: Diagnosis not present

## 2015-04-11 DIAGNOSIS — R0602 Shortness of breath: Secondary | ICD-10-CM

## 2015-04-11 NOTE — Patient Instructions (Signed)
Medication Instructions: - no changes  Labwork: - none  Procedures/Testing: - Your physician has requested that you have a lexiscan myoview. For further information please visit [email or web address]. Please follow instruction sheet, as given.  - Non-Cardiac CT scanning, (CAT scanning), is a noninvasive, special x-ray that produces cross-sectional images of the body using x-rays and a computer. CT scans help physicians diagnose and treat medical conditions. For some CT exams, a contrast material is used to enhance visibility in the area of the body being studied. CT scans provide greater clarity and reveal more details than regular x-ray exams.- CT scan of the chest- follow up pulmonary nodules  Follow-Up: - Remote monitoring is used to monitor your Pacemaker of ICD from home. This monitoring reduces the number of office visits required to check your device to one time per year. It allows [place] to keep an eye on the functioning of your device to ensure it is working properly. You are scheduled for a device check from home on 07/11/15. You may send your transmission at any time that day. If you have a wireless device, the transmission will be sent automatically. After your physician reviews your transmission, you will receive a postcard with your next transmission date.  - Your physician wants you to follow-up in: 1 year with Dr. Caryl Comes. You will receive a reminder letter in the mail two months in advance. If you don't receive a letter, please call our office to schedule the follow-up appointment.  Any Additional Special Instructions Will Be Listed Below (If Applicable). - none

## 2015-04-18 ENCOUNTER — Telehealth (HOSPITAL_COMMUNITY): Payer: Self-pay | Admitting: *Deleted

## 2015-04-18 LAB — CUP PACEART INCLINIC DEVICE CHECK
Battery Impedance: 228 Ohm
Battery Remaining Longevity: 112 mo
Brady Statistic AP VP Percent: 1 %
Brady Statistic AS VS Percent: 0 %
Implantable Lead Implant Date: 20041110
Implantable Lead Location: 753860
Lead Channel Impedance Value: 600 Ohm
Lead Channel Pacing Threshold Amplitude: 0.5 V
Lead Channel Pacing Threshold Pulse Width: 0.4 ms
Lead Channel Pacing Threshold Pulse Width: 0.4 ms
Lead Channel Sensing Intrinsic Amplitude: 4 mV
Lead Channel Setting Pacing Pulse Width: 0.4 ms
Lead Channel Setting Sensing Sensitivity: 4 mV
MDC IDC LEAD IMPLANT DT: 20041110
MDC IDC LEAD LOCATION: 753859
MDC IDC MSMT BATTERY VOLTAGE: 2.78 V
MDC IDC MSMT LEADCHNL RA IMPEDANCE VALUE: 540 Ohm
MDC IDC MSMT LEADCHNL RA PACING THRESHOLD AMPLITUDE: 0.5 V
MDC IDC MSMT LEADCHNL RA PACING THRESHOLD PULSEWIDTH: 0.4 ms
MDC IDC MSMT LEADCHNL RV PACING THRESHOLD AMPLITUDE: 0.75 V
MDC IDC MSMT LEADCHNL RV PACING THRESHOLD AMPLITUDE: 1.25 V
MDC IDC MSMT LEADCHNL RV PACING THRESHOLD PULSEWIDTH: 0.4 ms
MDC IDC SESS DTM: 20161011181100
MDC IDC SET LEADCHNL RA PACING AMPLITUDE: 2 V
MDC IDC SET LEADCHNL RV PACING AMPLITUDE: 2.5 V
MDC IDC STAT BRADY AP VS PERCENT: 0 %
MDC IDC STAT BRADY AS VP PERCENT: 98 %

## 2015-04-18 NOTE — Telephone Encounter (Signed)
Left message on voicemail per DPR in reference to upcoming appointment scheduled on 04/20/15 at 1000 with detailed instructions given per Myocardial Perfusion Study Information Sheet for the test. LM to arrive 15 minutes early, and that it is imperative to arrive on time for appointment to keep from having the test rescheduled. If you need to cancel or reschedule your appointment, please call the office within 24 hours of your appointment. Failure to do so may result in a cancellation of your appointment, and a $50 no show fee. Phone number given for call back for any questions. Shania Bjelland, Ranae Palms

## 2015-04-20 ENCOUNTER — Ambulatory Visit (HOSPITAL_COMMUNITY): Payer: Medicare Other | Attending: Internal Medicine

## 2015-04-20 ENCOUNTER — Ambulatory Visit (INDEPENDENT_AMBULATORY_CARE_PROVIDER_SITE_OTHER)
Admission: RE | Admit: 2015-04-20 | Discharge: 2015-04-20 | Disposition: A | Discharge status: Home / Self Care | Payer: Medicare Other | Source: Ambulatory Visit | Attending: Internal Medicine | Admitting: Internal Medicine

## 2015-04-20 VITALS — Ht 63.0 in | Wt 133.0 lb

## 2015-04-20 DIAGNOSIS — R918 Other nonspecific abnormal finding of lung field: Secondary | ICD-10-CM | POA: Diagnosis not present

## 2015-04-20 DIAGNOSIS — I10 Essential (primary) hypertension: Secondary | ICD-10-CM | POA: Diagnosis not present

## 2015-04-20 DIAGNOSIS — R0602 Shortness of breath: Secondary | ICD-10-CM

## 2015-04-20 DIAGNOSIS — R002 Palpitations: Secondary | ICD-10-CM | POA: Insufficient documentation

## 2015-04-20 DIAGNOSIS — R0609 Other forms of dyspnea: Secondary | ICD-10-CM | POA: Diagnosis not present

## 2015-04-20 DIAGNOSIS — R110 Nausea: Secondary | ICD-10-CM

## 2015-04-20 DIAGNOSIS — G4440 Drug-induced headache, not elsewhere classified, not intractable: Secondary | ICD-10-CM

## 2015-04-20 LAB — MYOCARDIAL PERFUSION IMAGING
CHL CUP NUCLEAR SRS: 7
CHL CUP NUCLEAR SSS: 10
LHR: 0.28
LV sys vol: 15 mL
LVDIAVOL: 57 mL
NUC STRESS TID: 0.94
Peak HR: 104 {beats}/min
Rest HR: 71 {beats}/min
SDS: 3

## 2015-04-20 MED ORDER — TECHNETIUM TC 99M SESTAMIBI GENERIC - CARDIOLITE
30.3000 | Freq: Once | INTRAVENOUS | Status: AC | PRN
  Administered 2015-04-20: 30.3 via INTRAVENOUS

## 2015-04-20 MED ORDER — TECHNETIUM TC 99M SESTAMIBI GENERIC - CARDIOLITE
10.8000 | Freq: Once | INTRAVENOUS | Status: AC | PRN
  Administered 2015-04-20: 11 via INTRAVENOUS

## 2015-04-20 MED ORDER — AMINOPHYLLINE 25 MG/ML IV SOLN
150.0000 mg | Freq: Two times a day (BID) | INTRAVENOUS | Status: AC | PRN
  Administered 2015-04-20: 150 mg via INTRAVENOUS

## 2015-04-20 MED ORDER — REGADENOSON 0.4 MG/5ML IV SOLN
0.4000 mg | Freq: Once | INTRAVENOUS | Status: AC
  Administered 2015-04-20: 0.4 mg via INTRAVENOUS

## 2015-04-26 ENCOUNTER — Other Ambulatory Visit: Payer: Self-pay

## 2015-04-26 DIAGNOSIS — N1330 Unspecified hydronephrosis: Secondary | ICD-10-CM

## 2015-04-28 ENCOUNTER — Ambulatory Visit (HOSPITAL_BASED_OUTPATIENT_CLINIC_OR_DEPARTMENT_OTHER)
Admission: RE | Admit: 2015-04-28 | Discharge: 2015-04-28 | Disposition: A | Discharge status: Home / Self Care | Payer: Medicare Other | Source: Ambulatory Visit | Attending: Family Medicine | Admitting: Family Medicine

## 2015-04-28 DIAGNOSIS — M4856XA Collapsed vertebra, not elsewhere classified, lumbar region, initial encounter for fracture: Secondary | ICD-10-CM | POA: Insufficient documentation

## 2015-04-28 DIAGNOSIS — N1330 Unspecified hydronephrosis: Secondary | ICD-10-CM | POA: Insufficient documentation

## 2015-04-28 DIAGNOSIS — K8020 Calculus of gallbladder without cholecystitis without obstruction: Secondary | ICD-10-CM | POA: Insufficient documentation

## 2015-05-02 ENCOUNTER — Other Ambulatory Visit: Payer: Self-pay

## 2015-05-02 DIAGNOSIS — S32009A Unspecified fracture of unspecified lumbar vertebra, initial encounter for closed fracture: Secondary | ICD-10-CM

## 2015-05-03 ENCOUNTER — Telehealth: Payer: Self-pay

## 2015-05-03 NOTE — Telephone Encounter (Signed)
Patient returning your call states number is showing up unknown and could we unblock our number informed patient I'm not sure if we can but will try

## 2015-05-04 NOTE — Telephone Encounter (Signed)
Message left to call, see imaging results.     KP

## 2015-05-05 ENCOUNTER — Encounter: Payer: Self-pay | Admitting: *Deleted

## 2015-05-08 ENCOUNTER — Other Ambulatory Visit: Payer: Self-pay | Admitting: Family Medicine

## 2015-05-08 DIAGNOSIS — M5440 Lumbago with sciatica, unspecified side: Secondary | ICD-10-CM

## 2015-05-10 ENCOUNTER — Other Ambulatory Visit: Payer: Self-pay

## 2015-05-10 DIAGNOSIS — N1330 Unspecified hydronephrosis: Secondary | ICD-10-CM

## 2015-05-11 ENCOUNTER — Ambulatory Visit (HOSPITAL_BASED_OUTPATIENT_CLINIC_OR_DEPARTMENT_OTHER)
Admission: RE | Admit: 2015-05-11 | Discharge: 2015-05-11 | Disposition: A | Discharge status: Home / Self Care | Payer: Medicare Other | Source: Ambulatory Visit | Attending: Family Medicine | Admitting: Family Medicine

## 2015-05-11 DIAGNOSIS — M4854XD Collapsed vertebra, not elsewhere classified, thoracic region, subsequent encounter for fracture with routine healing: Secondary | ICD-10-CM | POA: Insufficient documentation

## 2015-05-11 DIAGNOSIS — G8929 Other chronic pain: Secondary | ICD-10-CM | POA: Diagnosis not present

## 2015-05-11 DIAGNOSIS — M545 Low back pain: Secondary | ICD-10-CM | POA: Insufficient documentation

## 2015-05-11 DIAGNOSIS — M438X6 Other specified deforming dorsopathies, lumbar region: Secondary | ICD-10-CM | POA: Insufficient documentation

## 2015-05-11 DIAGNOSIS — M4806 Spinal stenosis, lumbar region: Secondary | ICD-10-CM | POA: Diagnosis not present

## 2015-05-11 DIAGNOSIS — M4856XD Collapsed vertebra, not elsewhere classified, lumbar region, subsequent encounter for fracture with routine healing: Secondary | ICD-10-CM | POA: Diagnosis not present

## 2015-05-11 DIAGNOSIS — M5440 Lumbago with sciatica, unspecified side: Secondary | ICD-10-CM

## 2015-05-29 ENCOUNTER — Ambulatory Visit (HOSPITAL_BASED_OUTPATIENT_CLINIC_OR_DEPARTMENT_OTHER)
Admission: RE | Admit: 2015-05-29 | Discharge: 2015-05-29 | Disposition: A | Discharge status: Home / Self Care | Payer: Medicare Other | Source: Ambulatory Visit | Attending: Family Medicine | Admitting: Family Medicine

## 2015-05-29 ENCOUNTER — Telehealth: Payer: Self-pay | Admitting: Family

## 2015-05-29 DIAGNOSIS — Z780 Asymptomatic menopausal state: Secondary | ICD-10-CM | POA: Diagnosis not present

## 2015-05-29 DIAGNOSIS — M8580 Other specified disorders of bone density and structure, unspecified site: Secondary | ICD-10-CM | POA: Insufficient documentation

## 2015-05-29 DIAGNOSIS — E2839 Other primary ovarian failure: Secondary | ICD-10-CM | POA: Diagnosis present

## 2015-05-29 DIAGNOSIS — M810 Age-related osteoporosis without current pathological fracture: Secondary | ICD-10-CM | POA: Diagnosis not present

## 2015-05-29 DIAGNOSIS — Z1231 Encounter for screening mammogram for malignant neoplasm of breast: Secondary | ICD-10-CM | POA: Insufficient documentation

## 2015-05-29 DIAGNOSIS — Z1239 Encounter for other screening for malignant neoplasm of breast: Secondary | ICD-10-CM

## 2015-05-29 NOTE — Telephone Encounter (Signed)
Please let pt know that her bone density shows osteoporosis. She should continue prolia/calcium.

## 2015-05-30 ENCOUNTER — Telehealth: Payer: Self-pay | Admitting: Family Medicine

## 2015-05-30 NOTE — Telephone Encounter (Signed)
Left detailed message on pt's voicemail and to call if any questions. 

## 2015-05-30 NOTE — Telephone Encounter (Signed)
Pt called for CT results. OK to leave her a detailed msg on her cell #.

## 2015-05-31 NOTE — Telephone Encounter (Signed)
Message routed to Ronnie Byrd, RN.   

## 2015-05-31 NOTE — Telephone Encounter (Signed)
Informed patient of the below CT results and the provider's recommendations.  Notes Recorded by Rosalita Chessman, DO on 05/11/2015 at 11:37 AM Another compression fracture but it is not new + stenosis of lower spine and disc bulge-- we can refer to ortho / neurosurgery if you would like-- just let us know     Patient voiced understanding, however she stated that she "would like to take some time to think about a referral to ortho or neurosurgery". Patient addressed that she will call the office or send an e-mail once she reaches a decision.

## 2015-06-06 ENCOUNTER — Encounter: Payer: Self-pay | Admitting: Family Medicine

## 2015-06-06 DIAGNOSIS — M545 Low back pain, unspecified: Secondary | ICD-10-CM

## 2015-06-06 DIAGNOSIS — M546 Pain in thoracic spine: Secondary | ICD-10-CM

## 2015-06-06 MED ORDER — TRAMADOL HCL 50 MG PO TABS: ORAL_TABLET | ORAL | Status: DC

## 2015-06-06 NOTE — Telephone Encounter (Signed)
Last Tramadol Rx, 02/28/15, #90. Pt last seen 02/28/15 and due for f/u 08/2015.  Rx printed and forwarded to covering Provider for signature.

## 2015-06-09 ENCOUNTER — Other Ambulatory Visit: Payer: Self-pay | Admitting: Urology

## 2015-06-15 ENCOUNTER — Telehealth: Payer: Self-pay | Admitting: Internal Medicine

## 2015-06-15 NOTE — Telephone Encounter (Signed)
Will forward to Dr. Klein. 

## 2015-06-15 NOTE — Telephone Encounter (Signed)
New message    Request for surgical clearance:  1. What type of surgery is being performed? Unblock kidney   2. When is this surgery scheduled? Pending   3. Are there any medications that need to be held prior to surgery and how long? No / is she stable for procedure   4. Name of physician performing surgery? Dr. Zannie Cove   5. What is your office phone and fax number? [phone] /  ext [phone]  6. fax [phone]

## 2015-06-16 NOTE — Telephone Encounter (Signed)
I called and spoke with [facility] at [facility].  The patient will require general anesthesia for her surgery. Per Dr. Caryl Comes, the patient needs a PA/ NP visit for clearance. I called and spoke with the patient.  She is agreeable with seeing Truitt Merle, NP on Wednesday 06/21/15 at 10:00 am.

## 2015-06-21 ENCOUNTER — Encounter: Payer: Self-pay | Admitting: Nurse Practitioner

## 2015-06-21 ENCOUNTER — Ambulatory Visit (INDEPENDENT_AMBULATORY_CARE_PROVIDER_SITE_OTHER): Payer: Medicare Other | Admitting: Nurse Practitioner

## 2015-06-21 VITALS — BP 160/100 | HR 84 | Ht 63.5 in | Wt 132.0 lb

## 2015-06-21 DIAGNOSIS — N1339 Other hydronephrosis: Secondary | ICD-10-CM

## 2015-06-21 DIAGNOSIS — I10 Essential (primary) hypertension: Secondary | ICD-10-CM | POA: Diagnosis not present

## 2015-06-21 DIAGNOSIS — R0600 Dyspnea, unspecified: Secondary | ICD-10-CM

## 2015-06-21 LAB — CBC
HCT: 38.1 % (ref 36.0–46.0)
Hemoglobin: 12.5 g/dL (ref 12.0–15.0)
MCH: 27.6 pg (ref 26.0–34.0)
MCHC: 32.8 g/dL (ref 30.0–36.0)
MCV: 84.1 fL (ref 78.0–100.0)
MPV: 10.3 fL (ref 8.6–12.4)
Platelets: 171 10*3/uL (ref 150–400)
RBC: 4.53 MIL/uL (ref 3.87–5.11)
RDW: 13 % (ref 11.5–15.5)
WBC: 5.7 10*3/uL (ref 4.0–10.5)

## 2015-06-21 MED ORDER — AMLODIPINE BESYLATE 5 MG PO TABS: 5.0000 mg | ORAL_TABLET | Freq: Every day | ORAL | Status: DC

## 2015-06-21 NOTE — Patient Instructions (Addendum)
We will be checking the following labs today - BMET, BNP and CBC   Medication Instructions:    Continue with your current medicines. BUT  I am adding Norvasc 5 mg to take daily for your BP - this has been sent to your drug store    Testing/Procedures To Be Arranged:  PFTS with diffusion  CT angio of the kidneys - rule out RAS  Follow-Up:   See me in 2 weeks    Other Special Instructions:   Monitor your BP for me and keep a diary.    If you need a refill on your cardiac medications before your next appointment, please call your pharmacy.   Call the [place] office at [phone] if you have any questions, problems or concerns.

## 2015-06-21 NOTE — Progress Notes (Signed)
CARDIOLOGY OFFICE NOTE  Date:  06/21/2015    [name] Date of Birth: 08-15-1940 Medical Record [id number]  PCP:  Garnet Koyanagi, DO  Cardiologist:  Caryl Comes    Chief Complaint  Patient presents with  . Pre-op Exam    Surgical clearance - seen for Dr. Caryl Comes    History of Present Illness: [name] is a 74 y.o. female who presents today for a pre op clearance visit. Seen for Dr. Caryl Comes.   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 Other issues include HTN, diastolic dysfunction, GERD, & depression. She is without known CAD. Low risk Myoview from October of 2016.   Seen here in October - had some pacemaker issues - required reprogramming. Had had episode of NSVT and noted some DOE - got a Myoview updated. Also with some lung nodules and a CT chest was obtained.  Comes in today. Here alone. Dr. Caryl Comes wished for her to be seen for pre op clearance. Wanting surgery by GU for hydronephrosis of the left kidney. This is of unclear etiology. She notes that she remains short of breath - with and without exertion. BP is "just bad". Uncontrolled despite her current therapy. Normal renal function from August noted. No chest pain reported. She is able to do all of her activities of daily living without issue. She is frustrated over her current issues.   Past Medical History  Diagnosis Date  . Atrioventricular block, complete ([place])     a. 08/2011 Upgrage of PPM to MDT Adapta L Dual Chamber PPM ser # [id number].  . Diastolic dysfunction   . HTN (hypertension)   . Tension headache   . Multinodular goiter   . Contact dermatitis and eczema     unspec cause  . Tooth abscess   . Osteoporosis   . GERD (gastroesophageal reflux disease)   . Depression   . Lung nodule   . Superficial thrombophlebitis   . Hypothyroidism   . Mitral regurgitation     a.03/2012 Echo: EF 65-70%, mod MR  . Chest pain     a. 09/2007 Myoview: EF 77%, no ischemia/infarct.    Past Surgical History    Procedure Laterality Date  . Laporatomy for ruptured diverticulum with colostomy  2003  . Take down of colostomy  2004  . Exploratory laparotomy    . Hernia repair    . Colon surgery    . Thyroidectomy N/A 08/07/2012    Procedure: THYROIDECTOMY;  Surgeon: Earnstine Regal, MD;  Location: [facility];  Service: General;  Laterality: N/A;  . Colonoscopy  01/19/2013    colonoscopy  . Pacemaker generator change Left 09/16/2011    MDT ADDRL1 pacemaker     Medications: Current Outpatient Prescriptions  Medication Sig Dispense Refill  . calcium carbonate (OS-CAL - DOSED IN MG OF ELEMENTAL CALCIUM) 1250 MG tablet Take 500 mg by mouth 3 (three) times daily.    . Chlorpheniramine-APAP (CORICIDIN) 2-325 MG TABS Take 2 tablets by mouth 2 (two) times daily as needed (allergies).    [name] denosumab (PROLIA) 60 MG/ML SOLN Inject 60 mg into the skin every 6 (six) months. Next injection scheduled for Tuesday, May 12th    . diphenhydramine-acetaminophen (TYLENOL PM) 25-500 MG TABS Take 2 tablets by mouth at bedtime as needed (sleep).     . fluticasone (FLONASE) 50 MCG/ACT nasal spray Place 2 sprays into both nostrils daily. (Patient taking differently: Place 2 sprays into both nostrils daily as  needed for allergies. ) 16 g 6  . levothyroxine (SYNTHROID, LEVOTHROID) 88 MCG tablet Take 1 tablet (88 mcg total) by mouth daily before breakfast. 90 tablet 3  . lisinopril-hydrochlorothiazide (ZESTORETIC) 20-12.5 MG per tablet Take 2 tablets by mouth daily. 180 tablet 3  . Multiple Vitamin (MULTIVITAMIN WITH MINERALS) TABS tablet Take 1 tablet by mouth daily.    [name] omeprazole (PRILOSEC) 20 MG capsule Take 1 capsule (20 mg total) by mouth 2 (two) times daily before a meal. 60 capsule 5  . traMADol (ULTRAM) 50 MG tablet 1-2 po q6h prn pain 90 tablet 0  . [DISCONTINUED] famotidine (PEPCID) 20 MG tablet Take 20 mg by mouth as needed.      No current facility-administered medications for this visit.   Facility-Administered  Medications Ordered in Other Visits  Medication Dose Route Frequency Provider Last Rate Last Dose  . aminophylline injection 150 mg  150 mg Intravenous BID PRN Dorothy Spark, MD   150 mg at 04/20/15 1200    Allergies: Allergies  Allergen Reactions  . Tape Rash  . Codeine Nausea And Vomiting    Social History: The patient  reports that she quit smoking about 51 years ago. She has never used smokeless tobacco. She reports that she does not drink alcohol or use illicit drugs.   Family History: The patient's family history includes Cancer in her son; Diverticulosis in her sister; Heart disease in her brother and mother; Heart failure in her father; Hypertension in her brother, brother, and mother; Stroke in her mother. There is no history of Heart attack.   Review of Systems: Please see the history of present illness.   Otherwise, the review of systems is positive for none.   All other systems are reviewed and negative.   Physical Exam: VS:  BP 160/100 mmHg  Pulse 84  Ht 5' 3.5" (1.613 m)  Wt 132 lb (59.875 kg)  BMI 23.01 kg/m2 .  BMI Body mass index is 23.01 kg/(m^2).  Wt Readings from Last 3 Encounters:  06/21/15 132 lb (59.875 kg)  04/20/15 133 lb (60.328 kg)  04/11/15 133 lb 9.6 oz (60.601 kg)   Her cuff reads 166/104  General: Pleasant. Well developed, well nourished and in no acute distress.  HEENT: Normal. Neck: Supple, no JVD, carotid bruits, or masses noted.  Cardiac: Regular rate and rhythm. No murmurs, rubs, or gallops. No edema.  Respiratory:  Lungs are clear to auscultation bilaterally with normal work of breathing.  GI: Soft and nontender.  MS: No deformity or atrophy. Gait and ROM intact. Skin: Warm and dry. Color is normal.  Neuro:  Strength and sensation are intact and no gross focal deficits noted.  Psych: Alert, appropriate and with normal affect.   LABORATORY DATA:  EKG:  EKG is ordered today. This demonstrates a paced rhythm.  Lab Results    Component Value Date   WBC 4.5 09/08/2014   HGB 12.7 09/08/2014   HCT 37.2 09/08/2014   PLT 173.0 09/08/2014   GLUCOSE 88 02/28/2015   CHOL 162 02/28/2015   TRIG 104.0 02/28/2015   HDL 46.50 02/28/2015   LDLCALC 94 02/28/2015   ALT 13 02/28/2015   AST 21 02/28/2015   NA 133* 02/28/2015   K 4.0 02/28/2015   CL 97 02/28/2015   CREATININE 1.20 02/28/2015   BUN 29* 02/28/2015   CO2 30 02/28/2015   TSH 2.33 09/08/2014   INR 1.01 11/04/2013   HGBA1C 5.4 08/04/2014    BNP (last 3  results) No results for input(s): BNP in the last 8760 hours.  ProBNP (last 3 results) No results for input(s): PROBNP in the last 8760 hours.   Other Studies Reviewed Today: Myoview Study Highlights from October 2016     Nuclear stress EF: 74%.  There was no ST segment deviation noted during stress.  The study is normal.  This is a low risk study.  The left ventricular ejection fraction is hyperdynamic (>65%).  Normal resting and stress perfusion EF 74% underlying rhythm paced    Echo Study Conclusions from 09/2014  - Left ventricle: The cavity size was normal. There was moderate focal basal hypertrophy. Systolic function was normal. The estimated ejection fraction was in the range of 55% to 60%. Wall motion was normal; there were no regional wall motion abnormalities. Features are consistent with a pseudonormal left ventricular filling pattern, with concomitant abnormal relaxation and increased filling pressure (grade 2 diastolic dysfunction). - Aortic valve: Mildly calcified annulus. Trileaflet. Thickening. Transvalvular velocity was within the normal range. There was no stenosis. There was no regurgitation. - Mitral valve: Transvalvular velocity was within the normal range. There was no evidence for stenosis. There was mild regurgitation. - Pulmonic valve: There was trivial regurgitation.    Assessment/Plan: 1. Pre op clearance - I would like to hold off - I  think she needs further evaluation. Given her uncontrolled HTN and hydronephrosis - I worry about RAS. Will get CT angio of the abdomen to rule out.   2. Underlying PPM  3. DOE - needs PFTs. Check BMET and BNP and CBC today.  4. HTN - not controlled. Adding Norvasc 5 mg a day - she has been on this in the past but could not really tell why it was stopped. She does not really remember but is agreeable to trying. She would like to avoid twice a day meds if possible to ensure compliance.   5. Stable Myoview from October [id number].  6. Diastolic dysfunction - needs good BP control, CV risk factor modification, etc.   Current medicines are reviewed with the patient today.  The patient does not have concerns regarding medicines other than what has been noted above.  The following changes have been made:  See above.  Labs/ tests ordered today include:    Orders Placed This Encounter  Procedures  . CT Angio Abdomen W/Cm &/Or Wo Contrast  . Brain natriuretic peptide  . Basic metabolic panel  . CBC  . EKG 12-Lead  . Pulmonary function test     Disposition:   I will see her back in 2 weeks. CT tomorrow. Arrange PFTs. Further disposition to follow.   FU with Dr. Caryl Comes as planned next October of 2017.   Patient is agreeable to this plan and will call if any problems develop in the interim.   Signed: Burtis Junes, RN, ANP-C 06/21/2015 10:50 AM  [facility] [address] [place], [place]  [address] Phone: [phone] Fax: [phone]

## 2015-06-22 ENCOUNTER — Ambulatory Visit
Admission: RE | Admit: 2015-06-22 | Discharge: 2015-06-22 | Disposition: A | Discharge status: Home / Self Care | Payer: Medicare Other | Source: Ambulatory Visit | Attending: Nurse Practitioner | Admitting: Nurse Practitioner

## 2015-06-22 DIAGNOSIS — N1339 Other hydronephrosis: Secondary | ICD-10-CM

## 2015-06-22 DIAGNOSIS — R0600 Dyspnea, unspecified: Secondary | ICD-10-CM

## 2015-06-22 DIAGNOSIS — I10 Essential (primary) hypertension: Secondary | ICD-10-CM

## 2015-06-22 LAB — BASIC METABOLIC PANEL
BUN: 18 mg/dL (ref 7–25)
CO2: 30 mmol/L (ref 20–31)
Calcium: 9.9 mg/dL (ref 8.6–10.4)
Chloride: 97 mmol/L — ABNORMAL LOW (ref 98–110)
Creat: 0.99 mg/dL — ABNORMAL HIGH (ref 0.60–0.93)
Glucose, Bld: 96 mg/dL (ref 65–99)
Potassium: 3.6 mmol/L (ref 3.5–5.3)
Sodium: 136 mmol/L (ref 135–146)

## 2015-06-22 LAB — BRAIN NATRIURETIC PEPTIDE: Brain Natriuretic Peptide: 140.3 pg/mL — ABNORMAL HIGH (ref 0.0–100.0)

## 2015-06-22 MED ORDER — IOHEXOL 350 MG/ML SOLN: 80.0000 mL | Freq: Once | INTRAVENOUS | Status: DC | PRN

## 2015-06-29 ENCOUNTER — Other Ambulatory Visit: Payer: Self-pay | Admitting: Nurse Practitioner

## 2015-06-29 DIAGNOSIS — N1339 Other hydronephrosis: Secondary | ICD-10-CM

## 2015-06-29 DIAGNOSIS — I10 Essential (primary) hypertension: Secondary | ICD-10-CM

## 2015-06-29 DIAGNOSIS — R0600 Dyspnea, unspecified: Secondary | ICD-10-CM

## 2015-06-30 ENCOUNTER — Ambulatory Visit (HOSPITAL_COMMUNITY): Payer: Medicare Other

## 2015-07-04 ENCOUNTER — Ambulatory Visit: Payer: Medicare Other | Admitting: Nurse Practitioner

## 2015-07-06 ENCOUNTER — Ambulatory Visit (HOSPITAL_COMMUNITY): Admission: RE | Admit: 2015-07-06 | Payer: Medicare Other | Source: Ambulatory Visit

## 2015-07-06 ENCOUNTER — Ambulatory Visit (HOSPITAL_COMMUNITY): Payer: Medicare Other

## 2015-07-11 ENCOUNTER — Ambulatory Visit (INDEPENDENT_AMBULATORY_CARE_PROVIDER_SITE_OTHER): Payer: Medicare Other | Admitting: *Deleted

## 2015-07-11 ENCOUNTER — Telehealth: Payer: Self-pay | Admitting: Cardiology

## 2015-07-11 ENCOUNTER — Ambulatory Visit: Payer: Medicare Other | Admitting: Nurse Practitioner

## 2015-07-11 ENCOUNTER — Other Ambulatory Visit: Payer: Self-pay | Admitting: Nurse Practitioner

## 2015-07-11 DIAGNOSIS — I442 Atrioventricular block, complete: Secondary | ICD-10-CM

## 2015-07-11 DIAGNOSIS — I10 Essential (primary) hypertension: Secondary | ICD-10-CM

## 2015-07-11 DIAGNOSIS — N189 Chronic kidney disease, unspecified: Secondary | ICD-10-CM

## 2015-07-11 NOTE — Telephone Encounter (Signed)
Confirmed remote transmission w/ pt.   

## 2015-07-12 ENCOUNTER — Telehealth: Payer: Self-pay

## 2015-07-12 ENCOUNTER — Other Ambulatory Visit: Payer: Self-pay | Admitting: Nurse Practitioner

## 2015-07-12 ENCOUNTER — Encounter (HOSPITAL_COMMUNITY): Payer: Self-pay

## 2015-07-12 ENCOUNTER — Ambulatory Visit (HOSPITAL_COMMUNITY)
Admission: RE | Admit: 2015-07-12 | Discharge: 2015-07-12 | Disposition: A | Discharge status: Home / Self Care | Payer: Medicare Other | Source: Ambulatory Visit | Attending: Nurse Practitioner | Admitting: Nurse Practitioner

## 2015-07-12 DIAGNOSIS — D7389 Other diseases of spleen: Secondary | ICD-10-CM | POA: Insufficient documentation

## 2015-07-12 DIAGNOSIS — I129 Hypertensive chronic kidney disease with stage 1 through stage 4 chronic kidney disease, or unspecified chronic kidney disease: Secondary | ICD-10-CM | POA: Diagnosis not present

## 2015-07-12 DIAGNOSIS — I10 Essential (primary) hypertension: Secondary | ICD-10-CM

## 2015-07-12 DIAGNOSIS — N189 Chronic kidney disease, unspecified: Secondary | ICD-10-CM | POA: Diagnosis present

## 2015-07-12 DIAGNOSIS — N1330 Unspecified hydronephrosis: Secondary | ICD-10-CM | POA: Insufficient documentation

## 2015-07-12 DIAGNOSIS — N2889 Other specified disorders of kidney and ureter: Secondary | ICD-10-CM | POA: Insufficient documentation

## 2015-07-12 DIAGNOSIS — M810 Age-related osteoporosis without current pathological fracture: Secondary | ICD-10-CM

## 2015-07-12 MED ORDER — IOHEXOL 350 MG/ML SOLN
100.0000 mL | Freq: Once | INTRAVENOUS | Status: AC | PRN
  Administered 2015-07-12: 100 mL via INTRAVENOUS

## 2015-07-12 NOTE — Telephone Encounter (Signed)
Patient left voicemail advising patient to call back and schedule appointment.

## 2015-07-12 NOTE — Progress Notes (Signed)
Remote pacemaker transmission.   

## 2015-07-12 NOTE — Telephone Encounter (Signed)
As of 07/06/15 Prolia benefits verified: Benefits subject to a $183 deductible ($0 met) and 20% co-insurance for the administration and cost of Prolia. No PA. Secondary plan: the provider is in network for this patient's plan. The secondary plan will coordinate benefits, subject to a $300 deductible ($0 met). Once the deductible has been met, the plan will consider 100% of the primary allowable. Patient effectively has a 20% co-insurance with coordination. Claims will cross; the secondary plan follow Medicare guidelines. NO PA needed

## 2015-07-13 ENCOUNTER — Ambulatory Visit (INDEPENDENT_AMBULATORY_CARE_PROVIDER_SITE_OTHER): Payer: Medicare Other | Admitting: Internal Medicine

## 2015-07-13 DIAGNOSIS — R0600 Dyspnea, unspecified: Secondary | ICD-10-CM

## 2015-07-13 DIAGNOSIS — I10 Essential (primary) hypertension: Secondary | ICD-10-CM

## 2015-07-13 LAB — PULMONARY FUNCTION TEST
DL/VA % pred: 73 %
DL/VA: 3.53 ml/min/mmHg/L
DLCO unc % pred: 62 %
DLCO unc: 15.1 ml/min/mmHg
FEF 25-75 Post: 1.23 L/sec
FEF 25-75 Pre: 0.99 L/sec
FEF2575-%Change-Post: 24 %
FEF2575-%Pred-Post: 85 %
FEF2575-%Pred-Pre: 68 %
FEV1-%Change-Post: 7 %
FEV1-%Pred-Post: 122 %
FEV1-%Pred-Pre: 114 %
FEV1-Post: 2.02 L
FEV1-Pre: 1.88 L
FEV1FVC-%Change-Post: 7 %
FEV1FVC-%Pred-Pre: 88 %
FEV6-%Change-Post: 0 %
FEV6-%Pred-Post: 136 %
FEV6-%Pred-Pre: 136 %
FEV6-Post: 2.78 L
FEV6-Pre: 2.78 L
FEV6FVC-%Change-Post: 0 %
FEV6FVC-%Pred-Post: 103 %
FEV6FVC-%Pred-Pre: 103 %
FVC-%Change-Post: 0 %
FVC-%Pred-Post: 131 %
FVC-%Pred-Pre: 131 %
FVC-Post: 2.8 L
FVC-Pre: 2.81 L
Post FEV1/FVC ratio: 72 %
Post FEV6/FVC ratio: 99 %
Pre FEV1/FVC ratio: 67 %
Pre FEV6/FVC Ratio: 99 %
RV % pred: 94 %
RV: 2.2 L
TLC % pred: 97 %
TLC: 4.93 L

## 2015-07-13 NOTE — Progress Notes (Signed)
PFT done today. 

## 2015-07-19 ENCOUNTER — Encounter: Payer: Self-pay | Admitting: Nurse Practitioner

## 2015-07-19 ENCOUNTER — Other Ambulatory Visit: Payer: Self-pay | Admitting: Urology

## 2015-07-19 ENCOUNTER — Ambulatory Visit (INDEPENDENT_AMBULATORY_CARE_PROVIDER_SITE_OTHER): Payer: Medicare Other | Admitting: Nurse Practitioner

## 2015-07-19 VITALS — BP 122/68 | HR 80 | Ht 63.5 in | Wt 133.0 lb

## 2015-07-19 DIAGNOSIS — I10 Essential (primary) hypertension: Secondary | ICD-10-CM

## 2015-07-19 DIAGNOSIS — I5032 Chronic diastolic (congestive) heart failure: Secondary | ICD-10-CM

## 2015-07-19 DIAGNOSIS — Z950 Presence of cardiac pacemaker: Secondary | ICD-10-CM

## 2015-07-19 DIAGNOSIS — N1339 Other hydronephrosis: Secondary | ICD-10-CM

## 2015-07-19 DIAGNOSIS — N1330 Unspecified hydronephrosis: Secondary | ICD-10-CM

## 2015-07-19 MED ORDER — AMLODIPINE BESYLATE 5 MG PO TABS: 5.0000 mg | ORAL_TABLET | Freq: Every day | ORAL | Status: DC

## 2015-07-19 MED FILL — AMLODIPINE BESYLATE 5 MG TA: 5 | 90 days supply | Qty: 90 | Fill #0

## 2015-07-19 NOTE — Patient Instructions (Addendum)
We will be checking the following labs today - NONE   Medication Instructions:    Continue with your current medicines.     Testing/Procedures To Be Arranged:  N/A  Follow-Up:   See me in 4 months.   See Dr. Caryl Comes in October for pacemaker check.   Other Special Instructions:   N/A    If you need a refill on your cardiac medications before your next appointment, please call your pharmacy.   Call the [place] office at [phone] if you have any questions, problems or concerns.

## 2015-07-19 NOTE — Progress Notes (Signed)
CARDIOLOGY OFFICE NOTE  Date:  07/19/2015    Francesco Runner Date of Birth: 1941/01/04 Medical Record [id number]  PCP:  Garnet Koyanagi, DO  Cardiologist:  Caryl Comes    Chief Complaint  Patient presents with  . Pre-op Exam  . Hypertension  . Congestive Heart Failure    Follow up visit - seen for Dr. Caryl Comes    History of Present Illness: LEISA ABRAMOVITZ is a 75 y.o. female who presents today for a follow up visit. Seen for Dr. Caryl Comes.   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 Other issues include HTN, diastolic dysfunction, GERD, & depression. She is without known CAD. Low risk Myoview from October of 2016.   Seen here in October - had some pacemaker issues - required reprogramming. Had had episode of NSVT and noted some DOE - got a Myoview updated. Also with some lung nodules and a CT chest was obtained. See below.   I saw her right before Christmas for a pre op clearance - needing possible ureter stent for left hydronephrosis. BP sky high. Restarted Norvasc. Got a CT - see below - has renal cell carcinoma but no RAS. Referred for PFTs as well - some abnormality noted.   After the CT - I spoke with Dr. Diona Fanti by phone about those results - she was cleared to proceed on with her surgery.   Comes in today. Here alone. Doing ok. Understands that she has renal cell carcinoma. Proceeding on with original plan of care. BP has improved quite nicely. Home readings are reviewed.  She notes that she had some swelling initially - but now resolved and she is happy with the Norvasc. No chest pain. Still with some vague shortness of breath - she wonders if it is from wearing an abdominal binder?? Notes her fingers and palms will turn purply at times - mostly exacerbated by the cold weather.    Past Medical History  Diagnosis Date  . Atrioventricular block, complete ([place])     a. 08/2011 Upgrage of PPM to MDT Adapta L Dual Chamber PPM ser # [id number].  . Diastolic dysfunction   . HTN  (hypertension)   . Tension headache   . Multinodular goiter   . Contact dermatitis and eczema     unspec cause  . Tooth abscess   . Osteoporosis   . GERD (gastroesophageal reflux disease)   . Depression   . Lung nodule   . Superficial thrombophlebitis   . Hypothyroidism   . Mitral regurgitation     a.03/2012 Echo: EF 65-70%, mod MR  . Chest pain     a. 09/2007 Myoview: EF 77%, no ischemia/infarct.    Past Surgical History  Procedure Laterality Date  . Laporatomy for ruptured diverticulum with colostomy  2003  . Take down of colostomy  2004  . Exploratory laparotomy    . Hernia repair    . Colon surgery    . Thyroidectomy N/A 08/07/2012    Procedure: THYROIDECTOMY;  Surgeon: Earnstine Regal, MD;  Location: [facility];  Service: General;  Laterality: N/A;  . Colonoscopy  01/19/2013    colonoscopy  . Pacemaker generator change Left 09/16/2011    MDT ADDRL1 pacemaker     Medications: Current Outpatient Prescriptions  Medication Sig Dispense Refill  . amLODipine (NORVASC) 5 MG tablet Take 1 tablet (5 mg total) by mouth daily. 90 tablet 3  . calcium carbonate (OS-CAL - DOSED IN MG OF ELEMENTAL CALCIUM)  1250 MG tablet Take 500 mg by mouth 3 (three) times daily.    . Chlorpheniramine-APAP (CORICIDIN) 2-325 MG TABS Take 2 tablets by mouth 2 (two) times daily as needed (allergies).    [name] denosumab (PROLIA) 60 MG/ML SOLN Inject 60 mg into the skin every 6 (six) months. Next injection scheduled for Tuesday, May 12th    . diphenhydramine-acetaminophen (TYLENOL PM) 25-500 MG TABS Take 2 tablets by mouth at bedtime as needed (sleep).     . fluticasone (FLONASE) 50 MCG/ACT nasal spray Place 2 sprays into both nostrils daily. (Patient taking differently: Place 2 sprays into both nostrils daily as needed for allergies. ) 16 g 6  . levothyroxine (SYNTHROID, LEVOTHROID) 88 MCG tablet Take 1 tablet (88 mcg total) by mouth daily before breakfast. 90 tablet 3  . lisinopril-hydrochlorothiazide (ZESTORETIC)  20-12.5 MG per tablet Take 2 tablets by mouth daily. 180 tablet 3  . Multiple Vitamin (MULTIVITAMIN WITH MINERALS) TABS tablet Take 1 tablet by mouth daily.    [name] omeprazole (PRILOSEC) 20 MG capsule Take 1 capsule (20 mg total) by mouth 2 (two) times daily before a meal. 60 capsule 5  . traMADol (ULTRAM) 50 MG tablet 1-2 po q6h prn pain 90 tablet 0  . [DISCONTINUED] famotidine (PEPCID) 20 MG tablet Take 20 mg by mouth as needed.      No current facility-administered medications for this visit.   Facility-Administered Medications Ordered in Other Visits  Medication Dose Route Frequency Provider Last Rate Last Dose  . aminophylline injection 150 mg  150 mg Intravenous BID PRN Dorothy Spark, MD   150 mg at 04/20/15 1200    Allergies: Allergies  Allergen Reactions  . Tape Rash  . Codeine Nausea And Vomiting    Social History: The patient  reports that she quit smoking about 51 years ago. She has never used smokeless tobacco. She reports that she does not drink alcohol or use illicit drugs.   Family History: The patient's family history includes Cancer in her son; Diverticulosis in her sister; Heart disease in her brother and mother; Heart failure in her father; Hypertension in her brother, brother, and mother; Stroke in her mother. There is no history of Heart attack.   Review of Systems: Please see the history of present illness.   Otherwise, the review of systems is positive for none.   All other systems are reviewed and negative.   Physical Exam: VS:  BP 122/68 mmHg  Pulse 80  Ht 5' 3.5" (1.613 m)  Wt 133 lb (60.328 kg)  BMI 23.19 kg/m2 .  BMI Body mass index is 23.19 kg/(m^2).  Wt Readings from Last 3 Encounters:  07/19/15 133 lb (60.328 kg)  06/21/15 132 lb (59.875 kg)  04/20/15 133 lb (60.328 kg)    General: Pleasant. She looks much younger than her stated age. She is alert and in no acute distress.  HEENT: Normal. Neck: Supple, no JVD, carotid bruits, or masses  noted.  Cardiac: Regular rate and rhythm. No murmurs, rubs, or gallops. No edema.  Respiratory:  Lungs are clear to auscultation bilaterally with normal work of breathing.  GI: Soft and nontender.  MS: No deformity or atrophy. Gait and ROM intact. Skin: Warm and dry. Color is normal.  Neuro:  Strength and sensation are intact and no gross focal deficits noted.  Psych: Alert, appropriate and with normal affect.   LABORATORY DATA:  EKG:  EKG is not ordered today.  Lab Results  Component Value Date  WBC 5.7 06/21/2015   HGB 12.5 06/21/2015   HCT 38.1 06/21/2015   PLT 171 06/21/2015   GLUCOSE 96 06/21/2015   CHOL 162 02/28/2015   TRIG 104.0 02/28/2015   HDL 46.50 02/28/2015   LDLCALC 94 02/28/2015   ALT 13 02/28/2015   AST 21 02/28/2015   NA 136 06/21/2015   K 3.6 06/21/2015   CL 97* 06/21/2015   CREATININE 0.99* 06/21/2015   BUN 18 06/21/2015   CO2 30 06/21/2015   TSH 2.33 09/08/2014   INR 1.01 11/04/2013   HGBA1C 5.4 08/04/2014    BNP (last 3 results) No results for input(s): BNP in the last 8760 hours.  ProBNP (last 3 results) No results for input(s): PROBNP in the last 8760 hours.   Other Studies Reviewed Today:  Myoview Study Highlights from October 2016     Nuclear stress EF: 74%.  There was no ST segment deviation noted during stress.  The study is normal.  This is a low risk study.  The left ventricular ejection fraction is hyperdynamic (>65%).  Normal resting and stress perfusion EF 74% underlying rhythm paced    Echo Study Conclusions from 09/2014  - Left ventricle: The cavity size was normal. There was moderate focal basal hypertrophy. Systolic function was normal. The estimated ejection fraction was in the range of 55% to 60%. Wall motion was normal; there were no regional wall motion abnormalities. Features are consistent with a pseudonormal left ventricular filling pattern, with concomitant abnormal relaxation and  increased filling pressure (grade 2 diastolic dysfunction). - Aortic valve: Mildly calcified annulus. Trileaflet. Thickening. Transvalvular velocity was within the normal range. There was no stenosis. There was no regurgitation. - Mitral valve: Transvalvular velocity was within the normal range. There was no evidence for stenosis. There was mild regurgitation. - Pulmonic valve: There was trivial regurgitation.  CTA ABDOMEN AND PELVIS WITHOUT CONTRAST  COMPARISON: Unenhanced CT of the abdomen and pelvis on 04/28/2015 and prior contrast enhanced study on 10/14/2013  FINDINGS: Arterial phase CTA imaging shows scattered calcified plaque in the abdominal aorta. No evidence of aortic aneurysm or stenosis. The celiac axis, superior mesenteric artery and inferior mesenteric artery show normal patency. There is a mild amount of calcified plaque near the origin of a single right renal artery which is not causing stenosis. A single left renal artery is also identified with a minimal amount of eccentric calcified plaque at its origin. There is no evidence of left renal artery stenosis or occlusion.  Bilateral common iliac, external and internal iliac arteries show normal patency. The common femoral arteries and femoral bifurcations are normally patent.  The left kidney shows chronic obstruction with very thinned and atrophic appearance to the renal cortex. The renal cortex does receive normal-appearing blood flow. 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 There are some crossing arterial branches close to the expected position of the UPJ and a component of crossing vessel as a potential etiology for UPJ stenosis/ obstruction cannot be entirely excluded. It is difficult to accurately localize the exact level of the UPJ since the ureter is not visualized and decompressed. No focal enhancing mass is seen  in the collecting system or at the level of the UPJ. Further delayed imaging was not performed to evaluate renal excretion into the collecting system.  In addition to the UPJ obstruction, there is an exophytic enhancing mass emanating from the top of the left kidney and growing posteriorly.  This lesion measures approximately 1.3 x 1.8 x 1.9 cm and shows peripheral enhancement with central necrosis/cystic change. When reviewing the prior contrast enhanced study on 10/14/2013, this does appear to be an enlarging abnormality and measured approximately 1.3 cm on the prior study. The abnormality is quite subtle on the prior study and only detectable on coronal and sagittal reconstructed images.  Multiple splenic lesions are again demonstrated and appears stable. 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 The liver, pancreas, gallbladder, right kidney and visualized bowel are unremarkable. The bladder has a normal appearance. No enlarged lymph nodes are seen. Ventral hernia mesh present with no evidence of hernia.  Review of the MIP images confirms the above findings.  IMPRESSION: 1. No evidence of renal artery stenosis or renal artery occlusion. 2. Chronic appearing obstruction of the left kidney with thinned left renal cortex due to severe hydronephrosis. Level of obstruction appears to be at the expected position of the UPJ. There does not appear to be an obvious underlying mass at this level. There are some crossing arterial branches in close proximity to the expected position of the UPJ and a component of crossing vessel as a potential etiology for UPJ obstruction cannot be excluded. The only unusual aspect of this obstruction is the fact that there was no hydronephrosis present on a CT dated 10/14/2013. Urologic consultation is recommended, if not already performed. 3. Additional detection on the current study of an  exophytic cortical mass emanating from the posterior aspect of the upper pole of the left kidney and measuring approximately 1.3 x 1.8 x 1.9 cm. This abnormality has clearly enlarged since the 2015 contrast-enhanced study and is consistent with renal neoplasm, most likely some type of renal carcinoma. Recommend Urologic consultation for this abnormality as well. 4. 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   Electronically Signed  By: Aletta Edouard M.D.  On: 07/12/2015 14:13  Pulmonary Function Diagnosis: Minimal Obstructive Airways Disease Insignificant response to bronchodilator Moderate Diffusion Defect  Assessment/Plan: 1. Pre op clearance for cysto, left retrograde pyelogram, possible left ureteroscopy and possible left ureteral stent placement - have already discussed with Dr. Diona Fanti by phone - ok to proceed from our standpoint. BP has improved nicely - she is ok to proceed.   2. Underlying PPM - sees Dr. Caryl Comes in October.   3. DOE - PFTS with minimal obstructive airway disease and moderate diffusion defect. She still notes some shortness of breath - we discussed possible pulmonary referral - she would like to hold off of for now but may consider once she gets her GU procedures behind her.   4. HTN - now back on Norvasc and BP has improved quite nicely.   5. Stable Myoview from October [id number].  6. Diastolic dysfunction - continue with good BP control, CV risk factor modification, etc.   7. Probable renal cell carcinoma - discussed with Dr. Diona Fanti by phone last week - further treatment/evaluation by him.   8. Chronic pulmonary nodules with last CT back in October showing unchanged findings since 2013. May need continued surveillance given the renal cell carcinoma findings.       Current medicines are reviewed with the patient today.  The patient does not have concerns regarding medicines other than what has  been noted above.  The following changes have been made:  See above.  Labs/ tests ordered today include:   No orders of the defined  types were placed in this encounter.     Disposition:   FU with Dr. Caryl Comes as planned in October. I will see her back in 4 months.   Patient is agreeable to this plan and will call if any problems develop in the interim.   Signed: Burtis Junes, RN, ANP-C 07/19/2015 9:00 AM  [facility] [address] [place], [place]  [address] Phone: [phone] Fax: [phone]

## 2015-07-20 ENCOUNTER — Other Ambulatory Visit: Payer: Self-pay | Admitting: Urology

## 2015-07-21 ENCOUNTER — Encounter (HOSPITAL_BASED_OUTPATIENT_CLINIC_OR_DEPARTMENT_OTHER): Payer: Self-pay | Admitting: *Deleted

## 2015-07-24 ENCOUNTER — Encounter (HOSPITAL_BASED_OUTPATIENT_CLINIC_OR_DEPARTMENT_OTHER): Payer: Self-pay | Admitting: *Deleted

## 2015-07-24 NOTE — Progress Notes (Addendum)
NPO AFTER MN.  ARRIVE AT 1015.  NEEDS ISTAT 8.  CURRENT EKG AND CHEST CT IN CHART AND EPIC.  WILL TAKE PRILOSEC , SYNTRHOID, AND NORVASC AM DOS W/ SIPS OF WATER.  REVIEWED CHART W/ DR Stanton Kidney JUDD MDA, OK TO PROCEED.

## 2015-07-26 ENCOUNTER — Ambulatory Visit (HOSPITAL_COMMUNITY)
Admission: RE | Admit: 2015-07-26 | Discharge: 2015-07-26 | Disposition: A | Discharge status: Home / Self Care | Payer: Medicare Other | Source: Ambulatory Visit | Attending: Urology | Admitting: Urology

## 2015-07-26 DIAGNOSIS — N1330 Unspecified hydronephrosis: Secondary | ICD-10-CM

## 2015-07-26 DIAGNOSIS — I110 Hypertensive heart disease with heart failure: Secondary | ICD-10-CM | POA: Diagnosis not present

## 2015-07-26 DIAGNOSIS — Z8711 Personal history of peptic ulcer disease: Secondary | ICD-10-CM | POA: Diagnosis not present

## 2015-07-26 DIAGNOSIS — Z79899 Other long term (current) drug therapy: Secondary | ICD-10-CM | POA: Diagnosis not present

## 2015-07-26 DIAGNOSIS — E039 Hypothyroidism, unspecified: Secondary | ICD-10-CM | POA: Diagnosis not present

## 2015-07-26 DIAGNOSIS — I10 Essential (primary) hypertension: Secondary | ICD-10-CM | POA: Diagnosis not present

## 2015-07-26 DIAGNOSIS — I480 Paroxysmal atrial fibrillation: Secondary | ICD-10-CM | POA: Diagnosis not present

## 2015-07-26 DIAGNOSIS — M810 Age-related osteoporosis without current pathological fracture: Secondary | ICD-10-CM | POA: Diagnosis not present

## 2015-07-26 DIAGNOSIS — Z7951 Long term (current) use of inhaled steroids: Secondary | ICD-10-CM | POA: Diagnosis not present

## 2015-07-26 DIAGNOSIS — I509 Heart failure, unspecified: Secondary | ICD-10-CM | POA: Diagnosis not present

## 2015-07-26 DIAGNOSIS — C642 Malignant neoplasm of left kidney, except renal pelvis: Secondary | ICD-10-CM | POA: Diagnosis not present

## 2015-07-26 DIAGNOSIS — Z87891 Personal history of nicotine dependence: Secondary | ICD-10-CM | POA: Diagnosis not present

## 2015-07-26 DIAGNOSIS — Z950 Presence of cardiac pacemaker: Secondary | ICD-10-CM | POA: Diagnosis not present

## 2015-07-26 DIAGNOSIS — G4730 Sleep apnea, unspecified: Secondary | ICD-10-CM | POA: Diagnosis not present

## 2015-07-26 DIAGNOSIS — K219 Gastro-esophageal reflux disease without esophagitis: Secondary | ICD-10-CM | POA: Diagnosis not present

## 2015-07-26 DIAGNOSIS — D49512 Neoplasm of unspecified behavior of left kidney: Secondary | ICD-10-CM | POA: Diagnosis not present

## 2015-07-26 DIAGNOSIS — I442 Atrioventricular block, complete: Secondary | ICD-10-CM | POA: Diagnosis not present

## 2015-07-26 MED ORDER — FUROSEMIDE 10 MG/ML IJ SOLN
INTRAMUSCULAR | Status: AC
  Filled 2015-07-26: qty 4

## 2015-07-26 MED ORDER — TECHNETIUM TC 99M MERTIATIDE
15.0000 | Freq: Once | INTRAVENOUS | Status: AC | PRN
  Administered 2015-07-26: 15 via INTRAVENOUS

## 2015-07-26 MED ORDER — FUROSEMIDE 10 MG/ML IJ SOLN: 30.0000 mg | Freq: Once | INTRAMUSCULAR | Status: DC

## 2015-07-28 ENCOUNTER — Ambulatory Visit (HOSPITAL_COMMUNITY): Payer: Medicare Other

## 2015-07-28 ENCOUNTER — Encounter (HOSPITAL_BASED_OUTPATIENT_CLINIC_OR_DEPARTMENT_OTHER)
Admission: RE | Disposition: A | Discharge status: Home / Self Care | Payer: Self-pay | Source: Ambulatory Visit | Attending: Urology

## 2015-07-28 ENCOUNTER — Ambulatory Visit (HOSPITAL_BASED_OUTPATIENT_CLINIC_OR_DEPARTMENT_OTHER): Payer: Medicare Other | Admitting: Anesthesiology

## 2015-07-28 ENCOUNTER — Ambulatory Visit (HOSPITAL_BASED_OUTPATIENT_CLINIC_OR_DEPARTMENT_OTHER)
Admission: RE | Admit: 2015-07-28 | Discharge: 2015-07-28 | Disposition: A | Discharge status: Home / Self Care | Payer: Medicare Other | Source: Ambulatory Visit | Attending: Urology | Admitting: Urology

## 2015-07-28 ENCOUNTER — Encounter (HOSPITAL_BASED_OUTPATIENT_CLINIC_OR_DEPARTMENT_OTHER): Payer: Self-pay | Admitting: *Deleted

## 2015-07-28 DIAGNOSIS — Z8711 Personal history of peptic ulcer disease: Secondary | ICD-10-CM | POA: Insufficient documentation

## 2015-07-28 DIAGNOSIS — I442 Atrioventricular block, complete: Secondary | ICD-10-CM | POA: Insufficient documentation

## 2015-07-28 DIAGNOSIS — Z950 Presence of cardiac pacemaker: Secondary | ICD-10-CM | POA: Insufficient documentation

## 2015-07-28 DIAGNOSIS — Z7951 Long term (current) use of inhaled steroids: Secondary | ICD-10-CM | POA: Insufficient documentation

## 2015-07-28 DIAGNOSIS — D49512 Neoplasm of unspecified behavior of left kidney: Secondary | ICD-10-CM | POA: Diagnosis not present

## 2015-07-28 DIAGNOSIS — I110 Hypertensive heart disease with heart failure: Secondary | ICD-10-CM | POA: Insufficient documentation

## 2015-07-28 DIAGNOSIS — Z79899 Other long term (current) drug therapy: Secondary | ICD-10-CM | POA: Insufficient documentation

## 2015-07-28 DIAGNOSIS — G4730 Sleep apnea, unspecified: Secondary | ICD-10-CM | POA: Insufficient documentation

## 2015-07-28 DIAGNOSIS — I509 Heart failure, unspecified: Secondary | ICD-10-CM | POA: Insufficient documentation

## 2015-07-28 DIAGNOSIS — N1330 Unspecified hydronephrosis: Secondary | ICD-10-CM | POA: Diagnosis not present

## 2015-07-28 DIAGNOSIS — C642 Malignant neoplasm of left kidney, except renal pelvis: Secondary | ICD-10-CM | POA: Insufficient documentation

## 2015-07-28 DIAGNOSIS — K219 Gastro-esophageal reflux disease without esophagitis: Secondary | ICD-10-CM | POA: Insufficient documentation

## 2015-07-28 DIAGNOSIS — I10 Essential (primary) hypertension: Secondary | ICD-10-CM | POA: Diagnosis not present

## 2015-07-28 DIAGNOSIS — M810 Age-related osteoporosis without current pathological fracture: Secondary | ICD-10-CM | POA: Insufficient documentation

## 2015-07-28 DIAGNOSIS — I480 Paroxysmal atrial fibrillation: Secondary | ICD-10-CM | POA: Insufficient documentation

## 2015-07-28 DIAGNOSIS — Z87891 Personal history of nicotine dependence: Secondary | ICD-10-CM | POA: Insufficient documentation

## 2015-07-28 DIAGNOSIS — E039 Hypothyroidism, unspecified: Secondary | ICD-10-CM | POA: Insufficient documentation

## 2015-07-28 HISTORY — DX: Personal history of other diseases of the circulatory system: Z86.79

## 2015-07-28 HISTORY — DX: Shortness of breath: R06.02

## 2015-07-28 HISTORY — DX: Paroxysmal atrial fibrillation: I48.0

## 2015-07-28 HISTORY — DX: Presence of cardiac pacemaker: Z95.0

## 2015-07-28 HISTORY — DX: Diaphragmatic hernia without obstruction or gangrene: K44.9

## 2015-07-28 HISTORY — DX: Malabsorption due to intolerance, not elsewhere classified: K90.49

## 2015-07-28 HISTORY — DX: Unspecified hydronephrosis: N13.30

## 2015-07-28 HISTORY — DX: Intestinal malabsorption, unspecified: K90.9

## 2015-07-28 HISTORY — DX: Personal history of urinary calculi: Z87.442

## 2015-07-28 HISTORY — PX: CYSTOSCOPY WITH RETROGRADE PYELOGRAM, URETEROSCOPY AND STENT PLACEMENT: SHX5789

## 2015-07-28 HISTORY — DX: Personal history of other endocrine, nutritional and metabolic disease: Z86.39

## 2015-07-28 HISTORY — DX: Personal history of other diseases of the digestive system: Z87.19

## 2015-07-28 HISTORY — DX: Other nonspecific abnormal finding of lung field: R91.8

## 2015-07-28 HISTORY — DX: Malignant neoplasm of left kidney, except renal pelvis: C64.2

## 2015-07-28 HISTORY — DX: Nocturia: R35.1

## 2015-07-28 HISTORY — DX: Sleep apnea, unspecified: G47.30

## 2015-07-28 HISTORY — DX: Personal history of other specified conditions: Z87.898

## 2015-07-28 LAB — POCT I-STAT, CHEM 8
BUN: 24 mg/dL — AB (ref 6–20)
CREATININE: 1.1 mg/dL — AB (ref 0.44–1.00)
Calcium, Ion: 1.18 mmol/L (ref 1.13–1.30)
Chloride: 100 mmol/L — ABNORMAL LOW (ref 101–111)
Glucose, Bld: 81 mg/dL (ref 65–99)
HEMATOCRIT: 39 % (ref 36.0–46.0)
HEMOGLOBIN: 13.3 g/dL (ref 12.0–15.0)
POTASSIUM: 4.5 mmol/L (ref 3.5–5.1)
Sodium: 140 mmol/L (ref 135–145)
TCO2: 29 mmol/L (ref 0–100)

## 2015-07-28 SURGERY — CYSTOURETEROSCOPY, WITH RETROGRADE PYELOGRAM AND STENT INSERTION
Anesthesia: General | Site: Ureter | Laterality: Left

## 2015-07-28 MED ORDER — PROPOFOL 10 MG/ML IV BOLUS
INTRAVENOUS | Status: AC
  Filled 2015-07-28: qty 20

## 2015-07-28 MED ORDER — PHENYLEPHRINE HCL 10 MG/ML IJ SOLN
INTRAMUSCULAR | Status: DC | PRN
  Administered 2015-07-28: 40 ug via INTRAVENOUS

## 2015-07-28 MED ORDER — ONDANSETRON HCL 4 MG/2ML IJ SOLN
INTRAMUSCULAR | Status: DC | PRN
  Administered 2015-07-28: 4 mg via INTRAVENOUS

## 2015-07-28 MED ORDER — FENTANYL CITRATE (PF) 100 MCG/2ML IJ SOLN
INTRAMUSCULAR | Status: DC | PRN
  Administered 2015-07-28 (×2): 25 ug via INTRAVENOUS

## 2015-07-28 MED ORDER — PHENYLEPHRINE 40 MCG/ML (10ML) SYRINGE FOR IV PUSH (FOR BLOOD PRESSURE SUPPORT)
PREFILLED_SYRINGE | INTRAVENOUS | Status: AC
  Filled 2015-07-28: qty 10

## 2015-07-28 MED ORDER — PROMETHAZINE HCL 25 MG/ML IJ SOLN
6.2500 mg | INTRAMUSCULAR | Status: DC | PRN
  Filled 2015-07-28: qty 1

## 2015-07-28 MED ORDER — HYDROMORPHONE HCL 1 MG/ML IJ SOLN
0.2500 mg | INTRAMUSCULAR | Status: DC | PRN
  Filled 2015-07-28: qty 1

## 2015-07-28 MED ORDER — ONDANSETRON HCL 4 MG/2ML IJ SOLN
INTRAMUSCULAR | Status: AC
  Filled 2015-07-28: qty 2

## 2015-07-28 MED ORDER — OXYBUTYNIN CHLORIDE 5 MG PO TABS: 5.0000 mg | ORAL_TABLET | Freq: Three times a day (TID) | ORAL | Status: DC

## 2015-07-28 MED ORDER — DEXAMETHASONE SODIUM PHOSPHATE 4 MG/ML IJ SOLN
INTRAMUSCULAR | Status: DC | PRN
  Administered 2015-07-28: 10 mg via INTRAVENOUS

## 2015-07-28 MED ORDER — CEFAZOLIN SODIUM-DEXTROSE 2-3 GM-% IV SOLR
INTRAVENOUS | Status: AC
  Filled 2015-07-28: qty 50

## 2015-07-28 MED ORDER — PROPOFOL 10 MG/ML IV BOLUS
INTRAVENOUS | Status: DC | PRN
  Administered 2015-07-28: 120 mg via INTRAVENOUS
  Administered 2015-07-28: 30 mg via INTRAVENOUS

## 2015-07-28 MED ORDER — LACTATED RINGERS IV SOLN
INTRAVENOUS | Status: DC
  Administered 2015-07-28: 11:00:00 via INTRAVENOUS
  Filled 2015-07-28: qty 1000

## 2015-07-28 MED ORDER — SODIUM CHLORIDE 0.9 % IR SOLN
Status: DC | PRN
  Administered 2015-07-28: 1000 mL
  Administered 2015-07-28: 3000 mL

## 2015-07-28 MED ORDER — LIDOCAINE HCL (CARDIAC) 20 MG/ML IV SOLN
INTRAVENOUS | Status: DC | PRN
  Administered 2015-07-28: 50 mg via INTRAVENOUS

## 2015-07-28 MED ORDER — OXYBUTYNIN CHLORIDE 5 MG PO TABS
ORAL_TABLET | ORAL | Status: AC
  Filled 2015-07-28: qty 1

## 2015-07-28 MED ORDER — DEXAMETHASONE SODIUM PHOSPHATE 10 MG/ML IJ SOLN
INTRAMUSCULAR | Status: AC
  Filled 2015-07-28: qty 1

## 2015-07-28 MED ORDER — OXYBUTYNIN CHLORIDE 5 MG PO TABS
5.0000 mg | ORAL_TABLET | Freq: Three times a day (TID) | ORAL | Status: DC
  Administered 2015-07-28: 5 mg via ORAL
  Filled 2015-07-28: qty 1

## 2015-07-28 MED ORDER — IOHEXOL 350 MG/ML SOLN
INTRAVENOUS | Status: DC | PRN
  Administered 2015-07-28: 10 mL

## 2015-07-28 MED ORDER — CEPHALEXIN 500 MG PO CAPS
500.0000 mg | ORAL_CAPSULE | Freq: Two times a day (BID) | ORAL | Status: DC
Start: 2015-07-28 — End: 2015-09-07

## 2015-07-28 MED ORDER — FENTANYL CITRATE (PF) 100 MCG/2ML IJ SOLN
INTRAMUSCULAR | Status: AC
  Filled 2015-07-28: qty 2

## 2015-07-28 MED ORDER — CEFAZOLIN SODIUM 1-5 GM-% IV SOLN
1.0000 g | INTRAVENOUS | Status: AC
  Administered 2015-07-28: 2 g via INTRAVENOUS
  Filled 2015-07-28: qty 50

## 2015-07-28 MED ORDER — LIDOCAINE HCL (CARDIAC) 20 MG/ML IV SOLN
INTRAVENOUS | Status: AC
  Filled 2015-07-28: qty 5

## 2015-07-28 MED FILL — CEPHALEXIN 500 MG CAPSULE: 500 | 3 days supply | Qty: 6 | Fill #0

## 2015-07-28 MED FILL — OXYBUTYNIN 5 MG TABLET: 5 | 20 days supply | Qty: 60 | Fill #0

## 2015-07-28 SURGICAL SUPPLY — 26 items
ADAPTER CATH URET PLST 4-6FR (CATHETERS) IMPLANT
ADPR CATH URET STRL DISP 4-6FR (CATHETERS)
BAG DRAIN URO-CYSTO SKYTR STRL (DRAIN) ×3 IMPLANT
CANISTER SUCT LVC 12 LTR MEDI- (MISCELLANEOUS) IMPLANT
CATH INTERMIT  6FR 70CM (CATHETERS) IMPLANT
CATH URET 5FR 28IN OPEN ENDED (CATHETERS) ×3 IMPLANT
CLOTH BEACON ORANGE TIMEOUT ST (SAFETY) ×3 IMPLANT
GLOVE BIO SURGEON STRL SZ8 (GLOVE) ×3 IMPLANT
GLOVE SURG SS PI 7.5 STRL IVOR (GLOVE) ×6 IMPLANT
GOWN STRL REUS W/ TWL LRG LVL3 (GOWN DISPOSABLE) ×1 IMPLANT
GOWN STRL REUS W/ TWL XL LVL3 (GOWN DISPOSABLE) ×1 IMPLANT
GOWN STRL REUS W/TWL LRG LVL3 (GOWN DISPOSABLE) ×2
GOWN STRL REUS W/TWL XL LVL3 (GOWN DISPOSABLE) ×2
GUIDEWIRE 0.038 PTFE COATED (WIRE) IMPLANT
GUIDEWIRE ANG ZIPWIRE 038X150 (WIRE) ×3 IMPLANT
GUIDEWIRE STR DUAL SENSOR (WIRE) ×3 IMPLANT
IV NS 1000ML (IV SOLUTION) ×2
IV NS 1000ML BAXH (IV SOLUTION) ×1 IMPLANT
IV NS IRRIG 3000ML ARTHROMATIC (IV SOLUTION) ×6 IMPLANT
KIT ROOM TURNOVER WOR (KITS) ×3 IMPLANT
MANIFOLD NEPTUNE II (INSTRUMENTS) ×3 IMPLANT
NS IRRIG 500ML POUR BTL (IV SOLUTION) IMPLANT
PACK CYSTO (CUSTOM PROCEDURE TRAY) ×3 IMPLANT
STENT URET 6FRX24 CONTOUR (STENTS) ×3 IMPLANT
TUBE CONNECTING 12'X1/4 (SUCTIONS) ×1
TUBE CONNECTING 12X1/4 (SUCTIONS) ×2 IMPLANT

## 2015-07-28 NOTE — Discharge Instructions (Addendum)
1. You may see some blood in the urine and may have some burning with urination for 48-72 hours. You also may notice that you have to urinate more frequently or urgently after your procedure which is normal.  °2. You should call should you develop an inability urinate, fever > 101, persistent nausea and vomiting that prevents you from eating or drinking to stay hydrated.  °3. If you have a stent, you will likely urinate more frequently and urgently until the stent is removed and you may experience some discomfort/pain in the lower abdomen and flank especially when urinating. You may take pain medication prescribed to you if needed for pain. You may also intermittently have blood in the urine until the stent is removed. °If you have a catheter, you will be taught how to take care of the catheter by the nursing staff prior to discharge from the hospital.  You may periodically feel a strong urge to void with the catheter in place.  This is a bladder spasm and most often can occur when having a bowel movement or moving around. It is typically self-limited and usually will stop after a few minutes.  You may use some Vaseline or Neosporin around the tip of the catheter to reduce friction at the tip of the penis. You may also see some blood in the urine.  A very small amount of blood can make the urine look quite red.  As long as the catheter is draining well, there usually is not a problem.  However, if the catheter is not draining well and is bloody, you should call the office ([phone]) to notify us. °Post Anesthesia Home Care Instructions ° °Activity: °Get plenty of rest for the remainder of the day. A responsible adult should stay with you for 24 hours following the procedure.  °For the next 24 hours, DO NOT: °-Drive a car °-Operate machinery °-Drink alcoholic beverages °-Take any medication unless instructed by your physician °-Make any legal decisions or sign important papers. ° °Meals: °Start with liquid foods  such as gelatin or soup. Progress to regular foods as tolerated. Avoid greasy, spicy, heavy foods. If nausea and/or vomiting occur, drink only clear liquids until the nausea and/or vomiting subsides. Call your physician if vomiting continues. ° °Special Instructions/Symptoms: °Your throat may feel dry or sore from the anesthesia or the breathing tube placed in your throat during surgery. If this causes discomfort, gargle with warm salt water. The discomfort should disappear within 24 hours. ° °If you had a scopolamine patch placed behind your ear for the management of post- operative nausea and/or vomiting: ° °1. The medication in the patch is effective for 72 hours, after which it should be removed.  Wrap patch in a tissue and discard in the trash. Wash hands thoroughly with soap and water. °2. You may remove the patch earlier than 72 hours if you experience unpleasant side effects which may include dry mouth, dizziness or visual disturbances. °3. Avoid touching the patch. Wash your hands with soap and water after contact with the patch. °  °4.  °

## 2015-07-28 NOTE — Op Note (Signed)
PATIENT:  [name]  PRE-OPERATIVE DIAGNOSIS: Left hydronephrosis  POST-OPERATIVE DIAGNOSIS: Same  PROCEDURE: Cystoscopy, left retrograde ureteropyelogram with interpretive fluoroscopy, placement of 24 cm x 6 French contour double-J stent, without string  SURGEON:  Lillette Boxer. Oluwafemi Villella, M.D.  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 The patient has 91/9% right/left renal splits. She was recently found, in addition, to have a small tumor in the left upper pole. She presents this time for cystoscopy, stent placement, to be followed down the road by repeat renogram. If there is salvageable left renal function, she will undergo pyeloplasty and partial nephrectomy. If no salvageable function, she will undergo nephrectomy.  Description of procedure: The patient was properly identified and marked (if applicable) in the holding area. They were then  taken to the operating room and placed on the table in a supine position. General anesthesia was then administered. Once fully anesthetized the patient was moved to the dorsolithotomy position and the genitalia and perineum were sterilely prepped and draped in standard fashion. An official timeout was then performed.   A 23 French panendoscope was advanced into the bladder. The bladder was inspected circumferentially. No tumors, trabeculations or foreign bodies were noted. Ureteral orifices were normal in configuration and location. A 6 French open-ended catheter was utilized to perform a retrograde ureteropyelogram. This was performed with Omnipaque. The entire ureter was normal. The ureteropelvic junction was never actually seen, but there seemed to be a tortuous pelvis, without any true "beaking".  The pelvis filled, and there was significant pyelocaliectasis. I did not see any filling defects along the course of the ureter, or the renal pelvis or calyceal system.  At this point, I negotiated a sensor-tip guidewire through the open-ended catheter. This was advanced into the renal pelvis and upper pole calyces. I was not able to advance the open-end catheter past the tortuous pelvis. I then used a zip wire to negotiate the guidewire into the upper pole, remove the 6 [place] open-ended catheter, and eventually was able to negotiate a 5 [place] open-ended catheter over the zip wire, into the upper pole calyces. The sensor-tip guidewire was then replaced, the open-ended catheter removed, and cystoscopically and fluoroscopically, a 24 cm, 6 French contour double-J stent was placed properly. Excellent proximal and distal curls were seen, and there was a hydronephrotic drainage of the kidney through the stent, identified cystoscopically. At this point, the bladder was drained. The patient was awakened and taken to the PACU in stable condition.  She will follow-up in a few weeks with repeat renal scan.   PLAN OF CARE: Discharge to home after PACU  PATIENT DISPOSITION:  PACU - hemodynamically stable.

## 2015-07-28 NOTE — Anesthesia Procedure Notes (Signed)
Procedure Name: LMA Insertion Date/Time: 07/28/2015 12:15 PM Performed by: Mechele Claude Pre-anesthesia Checklist: Patient identified, Emergency Drugs available, Suction available and Patient being monitored Patient Re-evaluated:Patient Re-evaluated prior to inductionOxygen Delivery Method: Circle System Utilized Preoxygenation: Pre-oxygenation with 100% oxygen Intubation Type: IV induction Ventilation: Mask ventilation without difficulty LMA: LMA inserted LMA Size: 4.0 Number of attempts: 1 Airway Equipment and Method: bite block Placement Confirmation: positive ETCO2 Tube secured with: Tape Dental Injury: Teeth and Oropharynx as per pre-operative assessment

## 2015-07-28 NOTE — Transfer of Care (Signed)
  Last Vitals:  Filed Vitals:   07/28/15 1033 07/28/15 1252  BP: 155/73   Pulse: 70   Temp: 36.3 C 36.2 C  Resp: 16   Immediate Anesthesia Transfer of Care Note  Patient: [name]  Procedure(s) Performed: Procedure(s) (LRB): CYSTOSCOPY WITH LEFT RETROGRADE PYELOGRAM, POSSIBLE LEFT URETEROSCOPY AND STENT PLACEMENT (Left)  Patient Location: PACU  Anesthesia Type: General  Level of Consciousness: awake, alert  and oriented  Airway & Oxygen Therapy: Patient Spontanous Breathing and Patient connected to nasal cannula oxygen  Post-op Assessment: Report given to PACU RN and Post -op Vital signs reviewed and stable  Post vital signs: Reviewed and stable  Complications: No apparent anesthesia complications

## 2015-07-28 NOTE — Anesthesia Preprocedure Evaluation (Addendum)
Anesthesia Evaluation  Patient identified by MRN, date of birth, ID band Patient awake    Reviewed: Allergy & Precautions, NPO status   History of Anesthesia Complications (+) PONV  Airway Mallampati: II  TM Distance: >3 FB Neck ROM: Full    Dental  (+) Teeth Intact, Caps,    Pulmonary shortness of breath, sleep apnea , former smoker,    breath sounds clear to auscultation       Cardiovascular hypertension, + dysrhythmias + pacemaker  Rhythm:Regular Rate:Normal  Hx of AV block   Neuro/Psych  Headaches,  Neuromuscular disease    GI/Hepatic PUD, GERD  ,  Endo/Other  Hypothyroidism   Renal/GU      Musculoskeletal   Abdominal   Peds  Hematology   Anesthesia Other Findings   Reproductive/Obstetrics                            Anesthesia Physical Anesthesia Plan  ASA: III  Anesthesia Plan: General   Post-op Pain Management:    Induction: Intravenous  Airway Management Planned: LMA  Additional Equipment:   Intra-op Plan:   Post-operative Plan: Extubation in OR  Informed Consent: I have reviewed the patients History and Physical, chart, labs and discussed the procedure including the risks, benefits and alternatives for the proposed anesthesia with the patient or authorized representative who has indicated his/her understanding and acceptance.   Dental advisory given  Plan Discussed with: CRNA and Surgeon  Anesthesia Plan Comments:        Anesthesia Quick Evaluation

## 2015-07-28 NOTE — Anesthesia Postprocedure Evaluation (Signed)
Anesthesia Post Note  Patient: NYKEA JACEK  Procedure(s) Performed: Procedure(s) (LRB): CYSTOSCOPY WITH LEFT RETROGRADE PYELOGRAM, POSSIBLE LEFT URETEROSCOPY AND STENT PLACEMENT (Left)  Patient location during evaluation: PACU Anesthesia Type: General Level of consciousness: awake and alert Pain management: pain level controlled Vital Signs Assessment: post-procedure vital signs reviewed and stable Respiratory status: spontaneous breathing, nonlabored ventilation, respiratory function stable and patient connected to nasal cannula oxygen Cardiovascular status: blood pressure returned to baseline and stable Postop Assessment: no signs of nausea or vomiting Anesthetic complications: no    Last Vitals:  Filed Vitals:   07/28/15 1340 07/28/15 1350  BP:  116/65  Pulse: 69 71  Temp:    Resp: 15 14    Last Pain: There were no vitals filed for this visit.               Latia Mataya L

## 2015-07-28 NOTE — H&P (Signed)
H&P  Chief Complaint: Blunt left kidney  History of Present Illness: [name] is a 75 y.o. 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  She has a split renal function of 91/9 right/left.She presents at this time for cystoscopy, left retrograde and stent placement, to be followed in a few weeks by repeat Lasix renogram.  Past Medical History  Diagnosis Date  . Atrioventricular block, complete ([place])     a. 08/2011 Upgrage of PPM to MDT Adapta L Dual Chamber PPM ser # [id number] H.  . Diastolic dysfunction   . HTN (hypertension)   . Contact dermatitis and eczema     unspec cause  . Osteoporosis   . GERD (gastroesophageal reflux disease)   . Depression   . Superficial thrombophlebitis   . Hypothyroidism   . PAF (paroxysmal atrial fibrillation) ([place])   . Pulmonary nodules     stable per last ct  . History of thyroid nodule     multinodular goiter  s/p  total thyroidectomy 2014  . Hiatal hernia   . History of diverticulitis of colon     2003  perforated diverticulitis w/ surgical intervention  . Hydronephrosis, left   . Renal cell carcinoma of left kidney ([facility])   . Cardiac pacemaker in situ   . PONV (postoperative nausea and vomiting)   . Shortness of breath     intermittant  . Nocturia   . Dairy product intolerance   . History of kidney stones     age 89  . History of peripheral edema     lower extremities  . Mild sleep apnea     per study 04-14-2014  . History of CHF (congestive heart failure)     Past Surgical History  Procedure Laterality Date  . Thyroidectomy N/A 08/07/2012    Procedure: THYROIDECTOMY;  Surgeon: Earnstine Regal, MD;  Location: [facility];  Service: General;  Laterality: N/A;  . Colonoscopy  01/19/2013  . Pacemaker generator change Left 09/16/2011    MDT ADDRL1 pacemaker  . Pacemaker placement  05-11-2003      medtronic  . Exploratory lapartomy sigmoid colectomy/ colostomy  04-28-2002    perforated diverticulitis  .  Colostomy takedown  07-22-2002  . Laparoscopy ventral hernia repair/  extensive lysis adhesions  05-04-2004  . Cardiovascular stress test  04-20-2015  dr Caryl Comes    normal nuclear study/  no ischemia/  normal LV function and wall motion, ef 74%  . Transthoracic echocardiogram  10-12-2014    moderate focal basal LVH,  ef 55-60%,  grade 2 diastolic dysfunction, mild AV calcification without stenosis,  mild MR,  trivial PR    Home Medications:  Medications Prior to Admission  Medication Sig Dispense Refill  . amLODipine (NORVASC) 5 MG tablet Take 1 tablet (5 mg total) by mouth daily. (Patient taking differently: Take 5 mg by mouth every morning. ) 90 tablet 3  . Calcium Carbonate-Vitamin D (OS-CAL 500 + D PO) Take 1 tablet by mouth 3 (three) times daily.    . Chlorpheniramine-APAP (CORICIDIN) 2-325 MG TABS Take 2 tablets by mouth 2 (two) times daily as needed (allergies).    . diphenhydramine-acetaminophen (TYLENOL PM) 25-500 MG TABS Take 2 tablets by mouth at bedtime as needed (sleep).     . fluticasone (FLONASE) 50 MCG/ACT nasal spray Place 2 sprays into both nostrils daily. (Patient taking differently: Place 2 sprays into both nostrils daily as needed for allergies. ) 16 g 6  .  levothyroxine (SYNTHROID, LEVOTHROID) 88 MCG tablet Take 1 tablet (88 mcg total) by mouth daily before breakfast. 90 tablet 3  . lisinopril-hydrochlorothiazide (ZESTORETIC) 20-12.5 MG per tablet Take 2 tablets by mouth daily. (Patient taking differently: Take 2 tablets by mouth every morning. ) 180 tablet 3  . Multiple Vitamin (MULTIVITAMIN WITH MINERALS) TABS tablet Take 1 tablet by mouth daily.    [name] omeprazole (PRILOSEC) 20 MG capsule Take 1 capsule (20 mg total) by mouth 2 (two) times daily before a meal. (Patient taking differently: Take 20 mg by mouth as needed. ) 60 capsule 5  . traMADol (ULTRAM) 50 MG tablet 1-2 po q6h prn pain 90 tablet 0  . denosumab (PROLIA) 60 MG/ML SOLN Inject 60 mg into the skin every 6 (six)  months. Next injection scheduled for Tuesday, May 12th      Allergies:  Allergies  Allergen Reactions  . Tape Rash  . Codeine Nausea And Vomiting    Family History  Problem Relation Age of Onset  . Heart disease Mother   . Heart failure Father   . Diverticulosis Sister   . Hypertension Brother   . Hypertension Brother   . Heart disease Brother   . Cancer Son     breast  . Heart attack Neg Hx   . Stroke Mother   . Hypertension Mother     Social History:  reports that she quit smoking about 35 years ago. Her smoking use included Cigarettes. She quit after 15 years of use. She has never used smokeless tobacco. She reports that she does not drink alcohol or use illicit drugs.  ROS: A complete review of systems was performed.  All systems are negative except for pertinent findings as noted.  Physical Exam:  Vital signs in last 24 hours: Temp:  [97.4 F (36.3 C)] 97.4 F (36.3 C) (01/27 1033) Pulse Rate:  [70] 70 (01/27 1033) Resp:  [16] 16 (01/27 1033) BP: (155)/(73) 155/73 mmHg (01/27 1033) SpO2:  [100 %] 100 % (01/27 1033) Weight:  [60.782 kg (134 lb)] 60.782 kg (134 lb) (01/27 1033) General:  Alert and oriented, No acute distress HEENT: Normocephalic, atraumatic Neck: No JVD or lymphadenopathy Cardiovascular: Regular rate and rhythm Lungs: Clear bilaterally Abdomen: Soft, nontender, nondistended, no abdominal masses Back: No CVA tenderness Extremities: No edema Neurologic: Grossly intact  Laboratory Data:  Results for orders placed or performed during the hospital encounter of 07/28/15 (from the past 24 hour(s))  I-STAT, chem 8     Status: Abnormal   Collection Time: 07/28/15 11:05 AM  Result Value Ref Range   Sodium 140 135 - 145 mmol/L   Potassium 4.5 3.5 - 5.1 mmol/L   Chloride 100 (L) 101 - 111 mmol/L   BUN 24 (H) 6 - 20 mg/dL   Creatinine, Ser 1.10 (H) 0.44 - 1.00 mg/dL   Glucose, Bld 81 65 - 99 mg/dL   Calcium, Ion 1.18 1.13 - 1.30 mmol/L   TCO2 29 0  - 100 mmol/L   Hemoglobin 13.3 12.0 - 15.0 g/dL   HCT 39.0 36.0 - 46.0 %   No results found for this or any previous visit (from the past 240 hour(s)). Creatinine:  Recent Labs  07/28/15 1105  CREATININE 1.10*    Radiologic Imaging: Nm Renal Imaging Flow W/pharm  07/26/2015  CLINICAL DATA:  Hydronephrosis on the LEFT.  RIGHT back pain. EXAM: NUCLEAR MEDICINE RENAL SCAN WITH DIURETIC ADMINISTRATION TECHNIQUE: Radionuclide angiographic and sequential renal images were obtained after intravenous injection of  radiopharmaceutical. Imaging was continued during slow intravenous injection of Lasix approximately 15 minutes after the start of the examination. RADIOPHARMACEUTICALS:  Fifteen Technetium-60m MAG3 IV COMPARISON:  None. FINDINGS: Flow:  Minimal arterial flow to the LEFT kidney. Left renogram: Minimal cortical uptake within the LEFT kidney. No clear excretion of counts into the LEFT renal collecting system. Right renogram: Normal cortical uptake and excretion into the collecting system. Counts clear the RIGHT renal collecting system prior to administration of Lasix. Minimal postvoid residual. Differential: Left kidney = 8 % Right kidney = 92 % T1/2 post Lasix : Left kidney = not applicable Right kidney = (counts clear near completely prior to administration of Lasix) IMPRESSION: 1. Severe obstructive hydronephrosis of the LEFT kidney with poor arterial vascular flow, poor parenchymal uptake and no visual excretion of radiotracer. 2. Severely asymmetric renal differential as above. 3. Normal LEFT kidney Electronically Signed   By: Suzy Bouchard M.D.   On: 07/26/2015 16:50    Impression/Assessment:  1.  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  Small left upper pole renal mass  Plan:  Cystoscopy, left retrograde, double-J stent placement  Jorja Loa 07/28/2015, 11:58 AM  Lillette Boxer. Gari Trovato MD

## 2015-07-31 ENCOUNTER — Encounter (HOSPITAL_BASED_OUTPATIENT_CLINIC_OR_DEPARTMENT_OTHER): Payer: Self-pay | Admitting: Urology

## 2015-07-31 LAB — CUP PACEART REMOTE DEVICE CHECK
Battery Voltage: 2.79 V
Brady Statistic AP VS Percent: 0 %
Brady Statistic AS VP Percent: 98 %
Implantable Lead Implant Date: 20041110
Implantable Lead Location: 753860
Lead Channel Pacing Threshold Amplitude: 0.5 V
Lead Channel Pacing Threshold Pulse Width: 0.4 ms
Lead Channel Pacing Threshold Pulse Width: 0.4 ms
Lead Channel Setting Pacing Amplitude: 2.5 V
Lead Channel Setting Sensing Sensitivity: 4 mV
MDC IDC LEAD IMPLANT DT: 20041110
MDC IDC LEAD LOCATION: 753859
MDC IDC MSMT BATTERY IMPEDANCE: 228 Ohm
MDC IDC MSMT BATTERY REMAINING LONGEVITY: 108 mo
MDC IDC MSMT LEADCHNL RA IMPEDANCE VALUE: 542 Ohm
MDC IDC MSMT LEADCHNL RA SENSING INTR AMPL: 2.8 mV
MDC IDC MSMT LEADCHNL RV IMPEDANCE VALUE: 475 Ohm
MDC IDC MSMT LEADCHNL RV PACING THRESHOLD AMPLITUDE: 0.625 V
MDC IDC SESS DTM: 20170110163646
MDC IDC SET LEADCHNL RA PACING AMPLITUDE: 2 V
MDC IDC SET LEADCHNL RV PACING PULSEWIDTH: 0.4 ms
MDC IDC STAT BRADY AP VP PERCENT: 2 %
MDC IDC STAT BRADY AS VS PERCENT: 0 %

## 2015-08-02 ENCOUNTER — Encounter: Payer: Self-pay | Admitting: Cardiology

## 2015-08-03 ENCOUNTER — Telehealth: Payer: Self-pay | Admitting: Family Medicine

## 2015-08-03 NOTE — Telephone Encounter (Signed)
Caller name: Mialyn   Relationship to patient: Self   Can be reached: [phone]  Reason for call: pt says that she received a message from [facility] stating that she is due her prolio vac. Pt says that she was under the impression that she already had it. Pt would like to know when was her last injection for Prolio?    Please advise further.    Thanks.

## 2015-08-03 NOTE — Telephone Encounter (Signed)
Last injection was 01/12/15 and the injection is still every 6 months. We have tried to contact the patient w/o success. A second message has been left call the office    KP

## 2015-08-03 NOTE — Telephone Encounter (Signed)
I called Dr.Dalhstedt office to make sure it is ok to give the Prolia with an elevated Creatinine. His office will get back to me and the patient is aware.      KP

## 2015-08-03 NOTE — Telephone Encounter (Signed)
Message left to call the office.    KP 

## 2015-08-04 NOTE — Telephone Encounter (Signed)
Spoke with Dr.Dalhstedt's office and was advised that [name] could make the choice on whether or not the patient could have her Prolia with an elevated creatinine level or she should see a kidney doctor in reference to her left kidney.  Please advise       KP

## 2015-08-04 NOTE — Telephone Encounter (Signed)
When does the stent come out--- wait until it comes out and recheck after a few weeks ----hold off on prolia until then

## 2015-08-07 NOTE — Telephone Encounter (Signed)
Spoke with patient and she gets the Stent taken out Mid-march,s he will call once that has been done to have her labs repeated and Prolia scheduled.     KP

## 2015-08-09 ENCOUNTER — Encounter: Payer: Self-pay | Admitting: Cardiology

## 2015-08-29 ENCOUNTER — Ambulatory Visit: Payer: Medicare Other | Admitting: Family Medicine

## 2015-08-30 ENCOUNTER — Other Ambulatory Visit: Payer: Self-pay | Admitting: Urology

## 2015-08-30 DIAGNOSIS — N1330 Unspecified hydronephrosis: Secondary | ICD-10-CM

## 2015-09-05 ENCOUNTER — Encounter: Payer: Self-pay | Admitting: Nurse Practitioner

## 2015-09-07 ENCOUNTER — Encounter: Payer: Self-pay | Admitting: Family Medicine

## 2015-09-07 ENCOUNTER — Ambulatory Visit (INDEPENDENT_AMBULATORY_CARE_PROVIDER_SITE_OTHER): Payer: Medicare Other | Admitting: Family Medicine

## 2015-09-07 VITALS — BP 120/70 | HR 82 | Temp 97.8°F | Ht 64.0 in | Wt 135.2 lb

## 2015-09-07 DIAGNOSIS — Z889 Allergy status to unspecified drugs, medicaments and biological substances status: Secondary | ICD-10-CM

## 2015-09-07 DIAGNOSIS — E039 Hypothyroidism, unspecified: Secondary | ICD-10-CM

## 2015-09-07 DIAGNOSIS — M545 Low back pain, unspecified: Secondary | ICD-10-CM

## 2015-09-07 DIAGNOSIS — M546 Pain in thoracic spine: Secondary | ICD-10-CM | POA: Diagnosis not present

## 2015-09-07 DIAGNOSIS — E890 Postprocedural hypothyroidism: Secondary | ICD-10-CM

## 2015-09-07 DIAGNOSIS — M5489 Other dorsalgia: Secondary | ICD-10-CM

## 2015-09-07 DIAGNOSIS — I10 Essential (primary) hypertension: Secondary | ICD-10-CM

## 2015-09-07 MED ORDER — LORATADINE 10 MG PO TABS: 10.0000 mg | ORAL_TABLET | Freq: Every day | ORAL | Status: DC

## 2015-09-07 MED ORDER — TRAMADOL HCL 50 MG PO TABS: ORAL_TABLET | ORAL | Status: DC

## 2015-09-07 MED ORDER — AZELASTINE HCL 0.1 % NA SOLN: 2.0000 | Freq: Two times a day (BID) | NASAL | Status: DC

## 2015-09-07 MED FILL — LORATADINE 10 MG TABLET: 10 | 100 days supply | Qty: 100 | Fill #0

## 2015-09-07 MED FILL — traMADol HCL 50 MG TABS: 50 | 11 days supply | Qty: 90 | Fill #0

## 2015-09-07 NOTE — Progress Notes (Signed)
Pre visit review using our clinic review tool, if applicable. No additional management support is needed unless otherwise documented below in the visit note. 

## 2015-09-07 NOTE — Patient Instructions (Addendum)
Hypertension Hypertension, commonly called high blood pressure, is when the force of blood pumping through your arteries is too strong. Your arteries are the blood vessels that carry blood from your heart throughout your body. A blood pressure reading consists of a higher number over a lower number, such as 110/72. The higher number (systolic) is the pressure inside your arteries when your heart pumps. The lower number (diastolic) is the pressure inside your arteries when your heart relaxes. Ideally you want your blood pressure below 120/80. Hypertension forces your heart to work harder to pump blood. Your arteries may become narrow or stiff. Having untreated or uncontrolled hypertension can cause heart attack, stroke, kidney disease, and other problems. RISK FACTORS Some risk factors for high blood pressure are controllable. Others are not.  Risk factors you cannot control include:   Race. You may be at higher risk if you are African American.  Age. Risk increases with age.  Gender. Men are at higher risk than women before age 45 years. After age 65, women are at higher risk than men. Risk factors you can control include:  Not getting enough exercise or physical activity.  Being overweight.  Getting too much fat, sugar, calories, or salt in your diet.  Drinking too much alcohol. SIGNS AND SYMPTOMS Hypertension does not usually cause signs or symptoms. Extremely high blood pressure (hypertensive crisis) may cause headache, anxiety, shortness of breath, and nosebleed. DIAGNOSIS To check if you have hypertension, your health care provider will measure your blood pressure while you are seated, with your arm held at the level of your heart. It should be measured at least twice using the same arm. Certain conditions can cause a difference in blood pressure between your right and left arms. A blood pressure reading that is higher than normal on one occasion does not mean that you need treatment. If  it is not clear whether you have high blood pressure, you may be asked to return on a different day to have your blood pressure checked again. Or, you may be asked to monitor your blood pressure at home for 1 or more weeks. TREATMENT Treating high blood pressure includes making lifestyle changes and possibly taking medicine. Living a healthy lifestyle can help lower high blood pressure. You may need to change some of your habits. Lifestyle changes may include:  Following the DASH diet. This diet is high in fruits, vegetables, and whole grains. It is low in salt, red meat, and added sugars.  Keep your sodium intake below 2,300 mg per day.  Getting at least 30-45 minutes of aerobic exercise at least 4 times per week.  Losing weight if necessary.  Not smoking.  Limiting alcoholic beverages.  Learning ways to reduce stress. Your health care provider may prescribe medicine if lifestyle changes are not enough to get your blood pressure under control, and if one of the following is true:  You are 18-59 years of age and your systolic blood pressure is above 140.  You are 60 years of age or older, and your systolic blood pressure is above 150.  Your diastolic blood pressure is above 90.  You have diabetes, and your systolic blood pressure is over 140 or your diastolic blood pressure is over 90.  You have kidney disease and your blood pressure is above 140/90.  You have heart disease and your blood pressure is above 140/90. Your personal target blood pressure may vary depending on your medical conditions, your age, and other factors. HOME CARE INSTRUCTIONS    Have your blood pressure rechecked as directed by your health care provider.   Take medicines only as directed by your health care provider. Follow the directions carefully. Blood pressure medicines must be taken as prescribed. The medicine does not work as well when you skip doses. Skipping doses also puts you at risk for  problems.  Do not smoke.   Monitor your blood pressure at home as directed by your health care provider. SEEK MEDICAL CARE IF:   You think you are having a reaction to medicines taken.  You have recurrent headaches or feel dizzy.  You have swelling in your ankles.  You have trouble with your vision. SEEK IMMEDIATE MEDICAL CARE IF:  You develop a severe headache or confusion.  You have unusual weakness, numbness, or feel faint.  You have severe chest or abdominal pain.  You vomit repeatedly.  You have trouble breathing. MAKE SURE YOU:   Understand these instructions.  Will watch your condition.  Will get help right away if you are not doing well or get worse.   This information is not intended to replace advice given to you by your health care provider. Make sure you discuss any questions you have with your health care provider.   Document Released: 06/17/2005 Document Revised: 11/01/2014 Document Reviewed: 04/09/2013 Elsevier Interactive Patient Education 2016 [employer] Hypertension Hypertension, commonly called high blood pressure, is when the force of blood pumping through your arteries is too strong. Your arteries are the blood vessels that carry blood from your heart throughout your body. A blood pressure reading consists of a higher number over a lower number, such as 110/72. The higher number (systolic) is the pressure inside your arteries when your heart pumps. The lower number (diastolic) is the pressure inside your arteries when your heart relaxes. Ideally you want your blood pressure below 120/80. Hypertension forces your heart to work harder to pump blood. Your arteries may become narrow or stiff. Having untreated or uncontrolled hypertension can cause heart attack, stroke, kidney disease, and other problems. RISK FACTORS Some risk factors for high blood pressure are controllable. Others are not.  Risk factors you cannot control include:   Race. You may  be at higher risk if you are African American.  Age. Risk increases with age.  Gender. Men are at higher risk than women before age 45 years. After age 65, women are at higher risk than men. Risk factors you can control include:  Not getting enough exercise or physical activity.  Being overweight.  Getting too much fat, sugar, calories, or salt in your diet.  Drinking too much alcohol. SIGNS AND SYMPTOMS Hypertension does not usually cause signs or symptoms. Extremely high blood pressure (hypertensive crisis) may cause headache, anxiety, shortness of breath, and nosebleed. DIAGNOSIS To check if you have hypertension, your health care provider will measure your blood pressure while you are seated, with your arm held at the level of your heart. It should be measured at least twice using the same arm. Certain conditions can cause a difference in blood pressure between your right and left arms. A blood pressure reading that is higher than normal on one occasion does not mean that you need treatment. If it is not clear whether you have high blood pressure, you may be asked to return on a different day to have your blood pressure checked again. Or, you may be asked to monitor your blood pressure at home for 1 or more weeks. TREATMENT Treating high blood pressure includes   making lifestyle changes and possibly taking medicine. Living a healthy lifestyle can help lower high blood pressure. You may need to change some of your habits. Lifestyle changes may include:  Following the DASH diet. This diet is high in fruits, vegetables, and whole grains. It is low in salt, red meat, and added sugars.  Keep your sodium intake below 2,300 mg per day.  Getting at least 30-45 minutes of aerobic exercise at least 4 times per week.  Losing weight if necessary.  Not smoking.  Limiting alcoholic beverages.  Learning ways to reduce stress. Your health care provider may prescribe medicine if lifestyle  changes are not enough to get your blood pressure under control, and if one of the following is true:  You are 18-59 years of age and your systolic blood pressure is above 140.  You are 60 years of age or older, and your systolic blood pressure is above 150.  Your diastolic blood pressure is above 90.  You have diabetes, and your systolic blood pressure is over 140 or your diastolic blood pressure is over 90.  You have kidney disease and your blood pressure is above 140/90.  You have heart disease and your blood pressure is above 140/90. Your personal target blood pressure may vary depending on your medical conditions, your age, and other factors. HOME CARE INSTRUCTIONS  Have your blood pressure rechecked as directed by your health care provider.   Take medicines only as directed by your health care provider. Follow the directions carefully. Blood pressure medicines must be taken as prescribed. The medicine does not work as well when you skip doses. Skipping doses also puts you at risk for problems.  Do not smoke.   Monitor your blood pressure at home as directed by your health care provider. SEEK MEDICAL CARE IF:   You think you are having a reaction to medicines taken.  You have recurrent headaches or feel dizzy.  You have swelling in your ankles.  You have trouble with your vision. SEEK IMMEDIATE MEDICAL CARE IF:  You develop a severe headache or confusion.  You have unusual weakness, numbness, or feel faint.  You have severe chest or abdominal pain.  You vomit repeatedly.  You have trouble breathing. MAKE SURE YOU:   Understand these instructions.  Will watch your condition.  Will get help right away if you are not doing well or get worse.   This information is not intended to replace advice given to you by your health care provider. Make sure you discuss any questions you have with your health care provider.   Document Released: 06/17/2005 Document  Revised: 11/01/2014 Document Reviewed: 04/09/2013 Elsevier Interactive Patient Education 2016 [employer] Inc[employer]  

## 2015-09-08 LAB — LIPID PANEL
CHOL/HDL RATIO: 4
CHOLESTEROL: 183 mg/dL (ref 0–200)
HDL: 43.5 mg/dL (ref >39.00)
LDL CALC: 105 mg/dL — AB (ref 0–99)
NonHDL: 139.69
Triglycerides: 173 mg/dL — ABNORMAL HIGH (ref 0.0–149.0)
VLDL: 34.6 mg/dL (ref 0.0–40.0)

## 2015-09-08 LAB — COMPREHENSIVE METABOLIC PANEL
ALBUMIN: 4.4 g/dL (ref 3.5–5.2)
ALT: 12 U/L (ref 0–35)
AST: 22 U/L (ref 0–37)
Alkaline Phosphatase: 46 U/L (ref 39–117)
BUN: 29 mg/dL — AB (ref 6–23)
CHLORIDE: 99 meq/L (ref 96–112)
CO2: 31 meq/L (ref 19–32)
CREATININE: 1.3 mg/dL — AB (ref 0.40–1.20)
Calcium: 10 mg/dL (ref 8.4–10.5)
GFR: 51.07 mL/min — ABNORMAL LOW (ref >60.00)
Glucose, Bld: 97 mg/dL (ref 70–99)
Potassium: 4.7 mEq/L (ref 3.5–5.1)
SODIUM: 138 meq/L (ref 135–145)
Total Bilirubin: 0.5 mg/dL (ref 0.2–1.2)
Total Protein: 7.1 g/dL (ref 6.0–8.3)

## 2015-09-08 LAB — TSH: TSH: 1.38 u[IU]/mL (ref 0.35–4.50)

## 2015-09-08 MED ORDER — LISINOPRIL-HYDROCHLOROTHIAZIDE 20-12.5 MG PO TABS: 2.0000 | ORAL_TABLET | Freq: Every day | ORAL | Status: DC

## 2015-09-08 MED ORDER — LEVOTHYROXINE SODIUM 88 MCG PO TABS: 88.0000 ug | ORAL_TABLET | Freq: Every day | ORAL | Status: DC

## 2015-09-08 MED ORDER — AMLODIPINE BESYLATE 5 MG PO TABS: 5.0000 mg | ORAL_TABLET | Freq: Every day | ORAL | Status: DC

## 2015-09-08 NOTE — Progress Notes (Signed)
Patient ID: [name], female    DOB: April 07, 1941  Age: 75 y.o. MRN: [id number]    Subjective:  Subjective HPI HELMI HECHAVARRIA presents for bp f/u.  No complaints.    Review of Systems  Constitutional: Negative for diaphoresis, appetite change, fatigue and unexpected weight change.  Eyes: Negative for pain, redness and visual disturbance.  Respiratory: Negative for cough, chest tightness, shortness of breath and wheezing.   Cardiovascular: Negative for chest pain, palpitations and leg swelling.  Endocrine: Negative for cold intolerance, heat intolerance, polydipsia, polyphagia and polyuria.  Genitourinary: Negative for dysuria, frequency and difficulty urinating.  Neurological: Negative for dizziness, light-headedness, numbness and headaches.    History Past Medical History  Diagnosis Date  . Atrioventricular block, complete ([place])     a. 08/2011 Upgrage of PPM to MDT Adapta L Dual Chamber PPM ser # [id number].  . Diastolic dysfunction   . HTN (hypertension)   . Contact dermatitis and eczema     unspec cause  . Osteoporosis   . GERD (gastroesophageal reflux disease)   . Depression   . Superficial thrombophlebitis   . Hypothyroidism   . PAF (paroxysmal atrial fibrillation) ([place])   . Pulmonary nodules     stable per last ct  . History of thyroid nodule     multinodular goiter  s/p  total thyroidectomy 2014  . Hiatal hernia   . History of diverticulitis of colon     2003  perforated diverticulitis w/ surgical intervention  . Hydronephrosis, left   . Renal cell carcinoma of left kidney ([facility])   . Cardiac pacemaker in situ   . PONV (postoperative nausea and vomiting)   . Shortness of breath     intermittant  . Nocturia   . Dairy product intolerance   . History of kidney stones     age 21  . History of peripheral edema     lower extremities  . Mild sleep apnea     per study 04-14-2014  . History of CHF (congestive heart failure)     She has past surgical history  that includes Thyroidectomy (N/A, 08/07/2012); Colonoscopy (01/19/2013); pacemaker generator change (Left, 09/16/2011); pacemaker placement (05-11-2003  ); EXPLORATORY LAPARTOMY SIGMOID COLECTOMY/ COLOSTOMY (04-28-2002); Colostomy takedown (07-22-2002); LAPAROSCOPY VENTRAL HERNIA REPAIR/  EXTENSIVE LYSIS ADHESIONS (05-04-2004); Cardiovascular stress test (04-20-2015  dr Caryl Comes); transthoracic echocardiogram (10-12-2014); and Cystoscopy with retrograde pyelogram, ureteroscopy and stent placement (Left, 07/28/2015).   Her family history includes Cancer in her son; Diverticulosis in her sister; Heart disease in her brother and mother; Heart failure in her father; Hypertension in her brother, brother, and mother; Stroke in her mother. There is no history of Heart attack.She reports that she quit smoking about 35 years ago. Her smoking use included Cigarettes. She quit after 15 years of use. She has never used smokeless tobacco. She reports that she does not drink alcohol or use illicit drugs.  Current Outpatient Prescriptions on File Prior to Visit  Medication Sig Dispense Refill  . amLODipine (NORVASC) 5 MG tablet Take 1 tablet (5 mg total) by mouth daily. (Patient taking differently: Take 5 mg by mouth every morning. ) 90 tablet 3  . Calcium Carbonate-Vitamin D (OS-CAL 500 + D PO) Take 1 tablet by mouth 3 (three) times daily.    . Chlorpheniramine-APAP (CORICIDIN) 2-325 MG TABS Take 2 tablets by mouth 2 (two) times daily as needed (allergies).    . fluticasone (FLONASE) 50 MCG/ACT nasal spray Place 2 sprays into both nostrils daily. (  Patient taking differently: Place 2 sprays into both nostrils daily as needed for allergies. ) 16 g 6  . levothyroxine (SYNTHROID, LEVOTHROID) 88 MCG tablet Take 1 tablet (88 mcg total) by mouth daily before breakfast. 90 tablet 3  . lisinopril-hydrochlorothiazide (ZESTORETIC) 20-12.5 MG per tablet Take 2 tablets by mouth daily. (Patient taking differently: Take 2 tablets by mouth  every morning. ) 180 tablet 3  . Multiple Vitamin (MULTIVITAMIN WITH MINERALS) TABS tablet Take 1 tablet by mouth daily.    [name] omeprazole (PRILOSEC) 20 MG capsule Take 1 capsule (20 mg total) by mouth 2 (two) times daily before a meal. (Patient taking differently: Take 20 mg by mouth as needed. ) 60 capsule 5  . oxybutynin (DITROPAN) 5 MG tablet Take 1 tablet (5 mg total) by mouth 3 (three) times daily. 60 tablet 1  . denosumab (PROLIA) 60 MG/ML SOLN Inject 60 mg into the skin every 6 (six) months. Reported on 09/07/2015    . [DISCONTINUED] famotidine (PEPCID) 20 MG tablet Take 20 mg by mouth as needed.      Current Facility-Administered Medications on File Prior to Visit  Medication Dose Route Frequency Provider Last Rate Last Dose  . aminophylline injection 150 mg  150 mg Intravenous BID PRN Dorothy Spark, MD   150 mg at 04/20/15 1200     Objective:  Objective Physical Exam  Constitutional: She is oriented to person, place, and time. She appears well-developed and well-nourished.  HENT:  Head: Normocephalic and atraumatic.  Eyes: Conjunctivae and EOM are normal.  Neck: Normal range of motion. Neck supple. No JVD present. Carotid bruit is not present. No thyromegaly present.  Cardiovascular: Normal rate, regular rhythm and normal heart sounds.   No murmur heard. Pulmonary/Chest: Effort normal and breath sounds normal. No respiratory distress. She has no wheezes. She has no rales. She exhibits no tenderness.  Musculoskeletal: She exhibits no edema.  Neurological: She is alert and oriented to person, place, and time.  Psychiatric: She has a normal mood and affect.  Nursing note and vitals reviewed.  BP 120/70 mmHg  Pulse 82  Temp(Src) 97.8 F (36.6 C) (Oral)  Ht '5\' 4"'  (1.626 m)  Wt 135 lb 3.2 oz (61.326 kg)  BMI 23.20 kg/m2  SpO2 96% Wt Readings from Last 3 Encounters:  09/07/15 135 lb 3.2 oz (61.326 kg)  07/28/15 134 lb (60.782 kg)  07/19/15 133 lb (60.328 kg)     Lab  Results  Component Value Date   WBC 5.7 06/21/2015   HGB 13.3 07/28/2015   HCT 39.0 07/28/2015   PLT 171 06/21/2015   GLUCOSE 97 09/07/2015   CHOL 183 09/07/2015   TRIG 173.0* 09/07/2015   HDL 43.50 09/07/2015   LDLCALC 105* 09/07/2015   ALT 12 09/07/2015   AST 22 09/07/2015   NA 138 09/07/2015   K 4.7 09/07/2015   CL 99 09/07/2015   CREATININE 1.30* 09/07/2015   BUN 29* 09/07/2015   CO2 31 09/07/2015   TSH 1.38 09/07/2015   INR 1.01 11/04/2013   HGBA1C 5.4 08/04/2014    Nm Renal Imaging Flow W/pharm  07/26/2015  CLINICAL DATA:  Hydronephrosis on the LEFT.  RIGHT back pain. EXAM: NUCLEAR MEDICINE RENAL SCAN WITH DIURETIC ADMINISTRATION TECHNIQUE: Radionuclide angiographic and sequential renal images were obtained after intravenous injection of radiopharmaceutical. Imaging was continued during slow intravenous injection of Lasix approximately 15 minutes after the start of the examination. RADIOPHARMACEUTICALS:  Fifteen Technetium-31mMAG3 IV COMPARISON:  None. FINDINGS: Flow:  Minimal arterial flow to the LEFT kidney. Left renogram: Minimal cortical uptake within the LEFT kidney. No clear excretion of counts into the LEFT renal collecting system. Right renogram: Normal cortical uptake and excretion into the collecting system. Counts clear the RIGHT renal collecting system prior to administration of Lasix. Minimal postvoid residual. Differential: Left kidney = 8 % Right kidney = 92 % T1/2 post Lasix : Left kidney = not applicable Right kidney = (counts clear near completely prior to administration of Lasix) IMPRESSION: 1. Severe obstructive hydronephrosis of the LEFT kidney with poor arterial vascular flow, poor parenchymal uptake and no visual excretion of radiotracer. 2. Severely asymmetric renal differential as above. 3. Normal LEFT kidney Electronically Signed   By: Suzy Bouchard M.D.   On: 07/26/2015 16:50     Assessment & Plan:  Plan I have discontinued Ms. Breese's  diphenhydramine-acetaminophen and cephALEXin. I am also having her start on loratadine and azelastine. Additionally, I am having her maintain her denosumab, Chlorpheniramine-APAP, multivitamin with minerals, omeprazole, levothyroxine, lisinopril-hydrochlorothiazide, fluticasone, amLODipine, Calcium Carbonate-Vitamin D (OS-CAL 500 + D PO), oxybutynin, cetirizine, and traMADol.  Meds ordered this encounter  Medications  . cetirizine (ZYRTEC) 10 MG tablet    Sig: Take 10 mg by mouth daily.  . traMADol (ULTRAM) 50 MG tablet    Sig: 1-2 po q6h prn pain    Dispense:  90 tablet    Refill:  1  . loratadine (CLARITIN) 10 MG tablet    Sig: Take 1 tablet (10 mg total) by mouth daily.    Dispense:  90 tablet    Refill:  3  . azelastine (ASTELIN) 0.1 % nasal spray    Sig: Place 2 sprays into both nostrils 2 (two) times daily. Use in each nostril as directed    Dispense:  30 mL    Refill:  12    Problem List Items Addressed This Visit    None    Visit Diagnoses    Essential hypertension    -  Primary    Relevant Orders    Comp Met (CMET) (Completed)    Lipid panel (Completed)    Hypothyroidism, unspecified hypothyroidism type        Relevant Orders    TSH (Completed)    Back pain, lumbosacral        Relevant Medications    traMADol (ULTRAM) 50 MG tablet    Bilateral thoracic back pain        Relevant Medications    traMADol (ULTRAM) 50 MG tablet    H/O seasonal allergies        Relevant Medications    loratadine (CLARITIN) 10 MG tablet    azelastine (ASTELIN) 0.1 % nasal spray       Follow-up: Return in about 6 months (around 03/09/2016), or if symptoms worsen or fail to improve, for hypertension, hyperlipidemia.  Garnet Koyanagi, DO

## 2015-09-11 ENCOUNTER — Other Ambulatory Visit: Payer: Self-pay | Admitting: Behavioral Health

## 2015-09-11 DIAGNOSIS — I10 Essential (primary) hypertension: Secondary | ICD-10-CM

## 2015-09-29 ENCOUNTER — Ambulatory Visit (HOSPITAL_COMMUNITY)
Admission: RE | Admit: 2015-09-29 | Discharge: 2015-09-29 | Disposition: A | Discharge status: Home / Self Care | Payer: Medicare Other | Source: Ambulatory Visit | Attending: Urology | Admitting: Urology

## 2015-09-29 ENCOUNTER — Other Ambulatory Visit: Payer: Self-pay | Admitting: Urology

## 2015-09-29 DIAGNOSIS — N139 Obstructive and reflux uropathy, unspecified: Secondary | ICD-10-CM | POA: Insufficient documentation

## 2015-09-29 DIAGNOSIS — N1330 Unspecified hydronephrosis: Secondary | ICD-10-CM | POA: Insufficient documentation

## 2015-09-29 MED ORDER — TECHNETIUM TC 99M MERTIATIDE
14.9700 | Freq: Once | INTRAVENOUS | Status: AC | PRN
  Administered 2015-09-29: 14.97 via INTRAVENOUS

## 2015-09-29 MED ORDER — FUROSEMIDE 10 MG/ML IJ SOLN
30.0000 mg | Freq: Once | INTRAMUSCULAR | Status: AC
  Administered 2015-09-29: 30 mg via INTRAVENOUS

## 2015-09-29 MED ORDER — FUROSEMIDE 10 MG/ML IJ SOLN
INTRAMUSCULAR | Status: AC
  Filled 2015-09-29: qty 4

## 2015-10-06 ENCOUNTER — Telehealth: Payer: Self-pay | Admitting: Family Medicine

## 2015-10-06 ENCOUNTER — Other Ambulatory Visit: Payer: Self-pay | Admitting: Family Medicine

## 2015-10-06 MED FILL — AMLODIPINE BESYLATE 5 MG TA: 5 | 90 days supply | Qty: 90 | Fill #0

## 2015-10-06 MED FILL — LISINOPRIL-HCTZ 20-12.5 MG: 20-12.5 | 90 days supply | Qty: 180 | Fill #0

## 2015-10-06 NOTE — Telephone Encounter (Signed)
Relation to [id number] Call back number: [id number] [facility] [place] [phone] (Phone) [phone] (Fax)         Reason for call:  Patient requesting levothyroxine (SYNTHROID, LEVOTHROID) 88 MCG tablet to go to  [facility] [place], [place] - [place] [phone] (Phone) [phone] (Fax)       Due to cost being $20 cheaper.

## 2015-10-06 NOTE — Telephone Encounter (Signed)
Rx faxed.    KP 

## 2015-10-10 ENCOUNTER — Ambulatory Visit (INDEPENDENT_AMBULATORY_CARE_PROVIDER_SITE_OTHER): Payer: Medicare Other | Admitting: *Deleted

## 2015-10-10 ENCOUNTER — Telehealth: Payer: Self-pay | Admitting: Cardiology

## 2015-10-10 DIAGNOSIS — Z950 Presence of cardiac pacemaker: Secondary | ICD-10-CM

## 2015-10-10 DIAGNOSIS — I442 Atrioventricular block, complete: Secondary | ICD-10-CM | POA: Diagnosis not present

## 2015-10-10 NOTE — Telephone Encounter (Signed)
Spoke with pt and reminded pt of remote transmission that is due today. Pt verbalized understanding.   

## 2015-10-10 NOTE — Progress Notes (Signed)
Remote pacemaker transmission.   

## 2015-11-14 LAB — CUP PACEART REMOTE DEVICE CHECK
Battery Impedance: 252 Ohm
Battery Remaining Longevity: 101 mo
Battery Voltage: 2.78 V
Brady Statistic AP VP Percent: 2 %
Brady Statistic AP VS Percent: 0 %
Brady Statistic AS VP Percent: 98 %
Brady Statistic AS VS Percent: 0 %
Date Time Interrogation Session: 20170411162941
Implantable Lead Implant Date: 20041110
Implantable Lead Implant Date: 20041110
Implantable Lead Location: 753859
Implantable Lead Location: 753860
Lead Channel Impedance Value: 393 Ohm
Lead Channel Impedance Value: 517 Ohm
Lead Channel Pacing Threshold Amplitude: 0.5 V
Lead Channel Pacing Threshold Amplitude: 0.75 V
Lead Channel Pacing Threshold Pulse Width: 0.4 ms
Lead Channel Pacing Threshold Pulse Width: 0.4 ms
Lead Channel Sensing Intrinsic Amplitude: 2.8 mV
Lead Channel Setting Pacing Amplitude: 2 V
Lead Channel Setting Pacing Amplitude: 2.5 V
Lead Channel Setting Pacing Pulse Width: 0.4 ms
Lead Channel Setting Sensing Sensitivity: 4 mV

## 2015-11-15 ENCOUNTER — Encounter: Payer: Self-pay | Admitting: Cardiology

## 2015-11-15 ENCOUNTER — Ambulatory Visit: Payer: Medicare Other | Admitting: Nurse Practitioner

## 2015-11-24 ENCOUNTER — Ambulatory Visit: Payer: Medicare Other | Admitting: Nurse Practitioner

## 2015-11-25 ENCOUNTER — Encounter: Payer: Self-pay | Admitting: Family Medicine

## 2015-12-06 ENCOUNTER — Ambulatory Visit (INDEPENDENT_AMBULATORY_CARE_PROVIDER_SITE_OTHER): Payer: Medicare Other | Admitting: Nurse Practitioner

## 2015-12-06 ENCOUNTER — Encounter: Payer: Self-pay | Admitting: Nurse Practitioner

## 2015-12-06 VITALS — BP 130/70 | HR 76 | Ht 63.5 in | Wt 140.0 lb

## 2015-12-06 DIAGNOSIS — I5032 Chronic diastolic (congestive) heart failure: Secondary | ICD-10-CM | POA: Diagnosis not present

## 2015-12-06 DIAGNOSIS — I10 Essential (primary) hypertension: Secondary | ICD-10-CM

## 2015-12-06 DIAGNOSIS — Z950 Presence of cardiac pacemaker: Secondary | ICD-10-CM | POA: Diagnosis not present

## 2015-12-06 NOTE — Patient Instructions (Addendum)
We will be checking the following labs today - NONE  Medication Instructions:    Continue with your current medicines.     Testing/Procedures To Be Arranged:  N/A  Follow-Up:   See Dr. Caryl Comes in October    Other Special Instructions:   Stay active!  100 calorie Klondikes!    If you need a refill on your cardiac medications before your next appointment, please call your pharmacy.   Call the [place] office at [phone] if you have any questions, problems or concerns.

## 2015-12-06 NOTE — Progress Notes (Signed)
CARDIOLOGY OFFICE NOTE  Date:  12/06/2015    [name] Date of Birth: 1941/04/16 Medical Record [id number]  PCP:  [name], DO  Cardiologist:  Caryl Comes    Chief Complaint  Patient presents with  . Hypertension  . Congestive Heart Failure    4 month check - seen for Dr. Caryl Comes    History of Present Illness: [name] is a 75 y.o. female who presents today for a 4 month check.  Seen for Dr. Caryl Comes.   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 Other issues include HTN, diastolic dysfunction, GERD, & depression. She is without known CAD. Low risk Myoview from October of 2016.   Seen here last October - had some pacemaker issues - required reprogramming. Had had episode of NSVT and noted some DOE - got a Myoview updated. Also with some lung nodules and a CT chest was obtained. See below.   I saw her right before Christmas for a pre op clearance - needing possible ureter stent for left hydronephrosis. BP sky high. Restarted Norvasc. Got a CT - see below - has renal cell carcinoma but no RAS. Referred for PFTs as well - some abnormality noted.   After the CT - I spoke with Dr. Diona Fanti by phone about those results - she was cleared to proceed on with her surgery.   Comes in today. Here alone. She is doing well clinically. No chest pain. Not short of breath. BP running good at home. Not dizzy or lightheaded. She is not walking as much as she has in the past due to some back issues - weight is up a few pounds - but otherwise. She is planning on having her nephrectomy later this fall.   Past Medical History  Diagnosis Date  . Atrioventricular block, complete ([place])     a. 08/2011 Upgrage of PPM to MDT Adapta L Dual Chamber PPM ser # [id number].  . Diastolic dysfunction   . HTN (hypertension)   . Contact dermatitis and eczema     unspec cause  . Osteoporosis   . GERD (gastroesophageal reflux disease)   . Depression   . Superficial thrombophlebitis   .  Hypothyroidism   . PAF (paroxysmal atrial fibrillation) ([place])   . Pulmonary nodules     stable per last ct  . History of thyroid nodule     multinodular goiter  s/p  total thyroidectomy 2014  . Hiatal hernia   . History of diverticulitis of colon     2003  perforated diverticulitis w/ surgical intervention  . Hydronephrosis, left   . Renal cell carcinoma of left kidney (HCC)   . Cardiac pacemaker in situ   . PONV (postoperative nausea and vomiting)   . Shortness of breath     intermittant  . Nocturia   . Dairy product intolerance   . History of kidney stones     age 50  . History of peripheral edema     lower extremities  . Mild sleep apnea     per study 04-14-2014  . History of CHF (congestive heart failure)     Past Surgical History  Procedure Laterality Date  . Thyroidectomy N/A 08/07/2012    Procedure: THYROIDECTOMY;  Surgeon: Earnstine Regal, MD;  Location: [facility];  Service: General;  Laterality: N/A;  . Colonoscopy  01/19/2013  . Pacemaker generator change Left 09/16/2011    MDT ADDRL1 pacemaker  . Pacemaker placement  05-11-2003      medtronic  . Exploratory lapartomy sigmoid colectomy/ colostomy  04-28-2002    perforated diverticulitis  . Colostomy takedown  07-22-2002  . Laparoscopy ventral hernia repair/  extensive lysis adhesions  05-04-2004  . Cardiovascular stress test  04-20-2015  dr Caryl Comes    normal nuclear study/  no ischemia/  normal LV function and wall motion, ef 74%  . Transthoracic echocardiogram  10-12-2014    moderate focal basal LVH,  ef 55-60%,  grade 2 diastolic dysfunction, mild AV calcification without stenosis,  mild MR,  trivial PR  . Cystoscopy with retrograde pyelogram, ureteroscopy and stent placement Left 07/28/2015    Procedure: CYSTOSCOPY WITH LEFT RETROGRADE PYELOGRAM, POSSIBLE LEFT URETEROSCOPY AND STENT PLACEMENT;  Surgeon: Franchot Gallo, MD;  Location: [facility];  Service: Urology;  Laterality: Left;      Medications: Current Outpatient Prescriptions  Medication Sig Dispense Refill  . amLODipine (NORVASC) 5 MG tablet Take 1 tablet (5 mg total) by mouth daily. 90 tablet 3  . Calcium Carbonate-Vitamin D (OS-CAL 500 + D PO) Take 1 tablet by mouth 3 (three) times daily.    . Chlorpheniramine-APAP (CORICIDIN) 2-325 MG TABS Take 2 tablets by mouth 2 (two) times daily as needed (allergies).    [name] denosumab (PROLIA) 60 MG/ML SOLN Inject 60 mg into the skin every 6 (six) months. Reported on 09/07/2015    . fluticasone (FLONASE) 50 MCG/ACT nasal spray Place 2 sprays into both nostrils daily. (Patient taking differently: Place 2 sprays into both nostrils daily as needed for allergies. ) 16 g 6  . levothyroxine (SYNTHROID, LEVOTHROID) 88 MCG tablet TAKE ONE TABLET BY MOUTH ONCE DAILY BEFORE BREAKFAST 90 tablet 3  . lisinopril-hydrochlorothiazide (ZESTORETIC) 20-12.5 MG tablet Take 2 tablets by mouth daily. 180 tablet 3  . loratadine (CLARITIN) 10 MG tablet Take 1 tablet (10 mg total) by mouth daily. 90 tablet 3  . Multiple Vitamin (MULTIVITAMIN WITH MINERALS) TABS tablet Take 1 tablet by mouth daily.    [name] omeprazole (PRILOSEC) 20 MG capsule Take 1 capsule (20 mg total) by mouth 2 (two) times daily before a meal. (Patient taking differently: Take 20 mg by mouth as needed. ) 60 capsule 5  . traMADol (ULTRAM) 50 MG tablet 1-2 po q6h prn pain 90 tablet 1  . [DISCONTINUED] famotidine (PEPCID) 20 MG tablet Take 20 mg by mouth as needed.      No current facility-administered medications for this visit.   Facility-Administered Medications Ordered in Other Visits  Medication Dose Route Frequency Provider Last Rate Last Dose  . aminophylline injection 150 mg  150 mg Intravenous BID PRN Dorothy Spark, MD   150 mg at 04/20/15 1200    Allergies: Allergies  Allergen Reactions  . Tape Rash  . Codeine Nausea And Vomiting    Social History: The patient  reports that she quit smoking about 36 years ago. Her  smoking use included Cigarettes. She quit after 15 years of use. She has never used smokeless tobacco. She reports that she does not drink alcohol or use illicit drugs.   Family History: The patient's family history includes Cancer in her son; Diverticulosis in her sister; Heart disease in her brother and mother; Heart failure in her father; Hypertension in her brother, brother, and mother; Stroke in her mother. There is no history of Heart attack.   Review of Systems: Please see the history of present illness.   Otherwise, the review of systems is positive for none.  All other systems are reviewed and negative.   Physical Exam: VS:  BP 130/70 mmHg  Pulse 76  Ht 5' 3.5" (1.613 m)  Wt 140 lb (63.504 kg)  BMI 24.41 kg/m2 .  BMI Body mass index is 24.41 kg/(m^2).  Wt Readings from Last 3 Encounters:  12/06/15 140 lb (63.504 kg)  09/07/15 135 lb 3.2 oz (61.326 kg)  07/28/15 134 lb (60.782 kg)    General: Pleasant. Well developed, well nourished and in no acute distress.  HEENT: Normal. Neck: Supple, no JVD, carotid bruits, or masses noted.  Cardiac: Regular rate and rhythm. No murmurs, rubs, or gallops. No edema.  Respiratory:  Lungs are clear to auscultation bilaterally with normal work of breathing.  GI: Soft and nontender.  MS: No deformity or atrophy. Gait and ROM intact. Skin: Warm and dry. Color is normal.  Neuro:  Strength and sensation are intact and no gross focal deficits noted.  Psych: Alert, appropriate and with normal affect.   LABORATORY DATA:  EKG:  EKG is not ordered today.   Lab Results  Component Value Date   WBC 5.7 06/21/2015   HGB 13.3 07/28/2015   HCT 39.0 07/28/2015   PLT 171 06/21/2015   GLUCOSE 97 09/07/2015   CHOL 183 09/07/2015   TRIG 173.0* 09/07/2015   HDL 43.50 09/07/2015   LDLCALC 105* 09/07/2015   ALT 12 09/07/2015   AST 22 09/07/2015   NA 138 09/07/2015   K 4.7 09/07/2015   CL 99 09/07/2015   CREATININE 1.30* 09/07/2015   BUN 29*  09/07/2015   CO2 31 09/07/2015   TSH 1.38 09/07/2015   INR 1.01 11/04/2013   HGBA1C 5.4 08/04/2014    BNP (last 3 results)  Recent Labs  06/21/15 1114  BNP 140.3*    ProBNP (last 3 results) No results for input(s): PROBNP in the last 8760 hours.   Other Studies Reviewed Today:  Myoview Study Highlights from October 2016     Nuclear stress EF: 74%.  There was no ST segment deviation noted during stress.  The study is normal.  This is a low risk study.  The left ventricular ejection fraction is hyperdynamic (>65%).  Normal resting and stress perfusion EF 74% underlying rhythm paced    Echo Study Conclusions from 09/2014  - Left ventricle: The cavity size was normal. There was moderate focal basal hypertrophy. Systolic function was normal. The estimated ejection fraction was in the range of 55% to 60%. Wall motion was normal; there were no regional wall motion abnormalities. Features are consistent with a pseudonormal left ventricular filling pattern, with concomitant abnormal relaxation and increased filling pressure (grade 2 diastolic dysfunction). - Aortic valve: Mildly calcified annulus. Trileaflet. Thickening. Transvalvular velocity was within the normal range. There was no stenosis. There was no regurgitation. - Mitral valve: Transvalvular velocity was within the normal range. There was no evidence for stenosis. There was mild regurgitation. - Pulmonic valve: There was trivial regurgitation.  CTA ABDOMEN AND PELVIS WITHOUT CONTRAST  COMPARISON: Unenhanced CT of the abdomen and pelvis on 04/28/2015 and prior contrast enhanced study on 10/14/2013  FINDINGS: Arterial phase CTA imaging shows scattered calcified plaque in the abdominal aorta. No evidence of aortic aneurysm or stenosis. The celiac axis, superior mesenteric artery and inferior mesenteric artery show normal patency. There is a mild amount of calcified plaque near  the origin of a single right renal artery which is not causing stenosis. A single left renal artery is also identified with a minimal  amount of eccentric calcified plaque at its origin. There is no evidence of left renal artery stenosis or occlusion.  Bilateral common iliac, external and internal iliac arteries show normal patency. The common femoral arteries and femoral bifurcations are normally patent.  The left kidney shows chronic obstruction with very thinned and atrophic appearance to the renal cortex. The renal cortex does receive normal-appearing blood flow. 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 There are some crossing arterial branches close to the expected position of the UPJ and a component of crossing vessel as a potential etiology for UPJ stenosis/ obstruction cannot be entirely excluded. It is difficult to accurately localize the exact level of the UPJ since the ureter is not visualized and decompressed. No focal enhancing mass is seen in the collecting system or at the level of the UPJ. Further delayed imaging was not performed to evaluate renal excretion into the collecting system.  In addition to the UPJ obstruction, there is an exophytic enhancing mass emanating from the top of the left kidney and growing posteriorly. This lesion measures approximately 1.3 x 1.8 x 1.9 cm and shows peripheral enhancement with central necrosis/cystic change. When reviewing the prior contrast enhanced study on 10/14/2013, this does appear to be an enlarging abnormality and measured approximately 1.3 cm on the prior study. The abnormality is quite subtle on the prior study and only detectable on coronal and sagittal reconstructed images.  Multiple splenic lesions are again demonstrated and appears stable. 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 The liver, pancreas, gallbladder, right kidney and visualized bowel are unremarkable. The bladder has a normal appearance. No enlarged lymph nodes are seen. Ventral hernia mesh present with no evidence of hernia.  Review of the MIP images confirms the above findings.  IMPRESSION: 1. No evidence of renal artery stenosis or renal artery occlusion. 2. Chronic appearing obstruction of the left kidney with thinned left renal cortex due to severe hydronephrosis. Level of obstruction appears to be at the expected position of the UPJ. There does not appear to be an obvious underlying mass at this level. There are some crossing arterial branches in close proximity to the expected position of the UPJ and a component of crossing vessel as a potential etiology for UPJ obstruction cannot be excluded. The only unusual aspect of this obstruction is the fact that there was no hydronephrosis present on a CT dated 10/14/2013. Urologic consultation is recommended, if not already performed. 3. Additional detection on the current study of an exophytic cortical mass emanating from the posterior aspect of the upper pole of the left kidney and measuring approximately 1.3 x 1.8 x 1.9 cm. This abnormality has clearly enlarged since the 2015 contrast-enhanced study and is consistent with renal neoplasm, most likely some type of renal carcinoma. Recommend Urologic consultation for this abnormality as well. 4. 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   Electronically Signed  By: Aletta Edouard M.D.  On: 07/12/2015 14:13  Pulmonary Function Diagnosis: Minimal Obstructive Airways Disease Insignificant response to bronchodilator Moderate Diffusion Defect  Assessment/Plan: 1. Underlying PPM - sees Dr. Caryl Comes in October.   2. DOE - PFTS with minimal obstructive airway disease and moderate diffusion defect. She really does not  complain about this now - would hold on referral to pulmonary.  3.  HTN - BP looks great on her current regimen  4. Stable Myoview from October [id number].  5. Diastolic dysfunction - continue with good BP control, CV risk factor modification, etc. She looks good clinically. I think her few pounds of weight gain are more a reflection of less activity and liking ice cream.   7. Slow growing renal cell carcinoma - she reports she will be having nephrectomy later this fall.   8. Chronic pulmonary nodules with last CT back in October showing unchanged findings since 2013. May need continued surveillance given the renal cell carcinoma findings.             Current medicines are reviewed with the patient today.  The patient does not have concerns regarding medicines other than what has been noted above.  The following changes have been made:  See above.  Labs/ tests ordered today include:   No orders of the defined types were placed in this encounter.     Disposition:   FU with Dr. Caryl Comes in October. I will be happy to see back as well as needed.  Patient is agreeable to this plan and will call if any problems develop in the interim.   Signed: Burtis Junes, RN, ANP-C 12/06/2015 11:40 AM  [facility] [address] [place], [place]  [address] Phone: [phone] Fax: [phone]

## 2015-12-12 ENCOUNTER — Other Ambulatory Visit: Payer: Medicare Other

## 2015-12-13 ENCOUNTER — Other Ambulatory Visit (INDEPENDENT_AMBULATORY_CARE_PROVIDER_SITE_OTHER): Payer: Medicare Other

## 2015-12-13 DIAGNOSIS — I10 Essential (primary) hypertension: Secondary | ICD-10-CM | POA: Diagnosis not present

## 2015-12-13 LAB — LIPID PANEL
CHOL/HDL RATIO: 4
Cholesterol: 187 mg/dL (ref 0–200)
HDL: 47.7 mg/dL (ref >39.00)
LDL CALC: 120 mg/dL — AB (ref 0–99)
NONHDL: 139.08
Triglycerides: 93 mg/dL (ref 0.0–149.0)
VLDL: 18.6 mg/dL (ref 0.0–40.0)

## 2015-12-13 MED FILL — LEVOTHYROXINE 88 MCG TABLET: 88 | 90 days supply | Qty: 90 | Fill #0

## 2015-12-13 MED FILL — LORATADINE 10 MG TABLET: 10 | 100 days supply | Qty: 100 | Fill #1

## 2015-12-13 MED FILL — SM ALLERGY RELIEF 50 MCG SP: 50 MCG | 30 days supply | Qty: 16 | Fill #2

## 2015-12-14 LAB — COMPLETE METABOLIC PANEL WITH GFR
ALT: 11 U/L (ref 6–29)
AST: 22 U/L (ref 10–35)
Albumin: 4.3 g/dL (ref 3.6–5.1)
Alkaline Phosphatase: 73 U/L (ref 33–130)
BUN: 24 mg/dL (ref 7–25)
CHLORIDE: 100 mmol/L (ref 98–110)
CO2: 18 mmol/L — AB (ref 20–31)
CREATININE: 1.26 mg/dL — AB (ref 0.60–0.93)
Calcium: 8.9 mg/dL (ref 8.6–10.4)
GFR, Est African American: 47 mL/min — ABNORMAL LOW (ref >=60)
GFR, Est Non African American: 41 mL/min — ABNORMAL LOW (ref >=60)
GLUCOSE: 121 mg/dL — AB (ref 65–99)
Potassium: 4.7 mmol/L (ref 3.5–5.3)
Sodium: 138 mmol/L (ref 135–146)
TOTAL PROTEIN: 6.8 g/dL (ref 6.1–8.1)
Total Bilirubin: 0.5 mg/dL (ref 0.2–1.2)

## 2015-12-20 ENCOUNTER — Encounter: Payer: Self-pay | Admitting: *Deleted

## 2015-12-22 ENCOUNTER — Other Ambulatory Visit: Payer: Self-pay | Admitting: *Deleted

## 2015-12-22 DIAGNOSIS — R7309 Other abnormal glucose: Secondary | ICD-10-CM

## 2015-12-22 DIAGNOSIS — E7800 Pure hypercholesterolemia, unspecified: Secondary | ICD-10-CM

## 2015-12-27 ENCOUNTER — Encounter: Payer: Self-pay | Admitting: Family Medicine

## 2015-12-27 DIAGNOSIS — M545 Low back pain, unspecified: Secondary | ICD-10-CM

## 2015-12-27 DIAGNOSIS — M546 Pain in thoracic spine: Secondary | ICD-10-CM

## 2015-12-27 DIAGNOSIS — I10 Essential (primary) hypertension: Secondary | ICD-10-CM

## 2015-12-27 MED FILL — AMLODIPINE BESYLATE 5 MG TA: 5 | 90 days supply | Qty: 90 | Fill #1

## 2015-12-27 MED FILL — traMADol HCL 50 MG TABS: 50 | 11 days supply | Qty: 90 | Fill #1

## 2015-12-28 MED ORDER — LISINOPRIL-HYDROCHLOROTHIAZIDE 20-12.5 MG PO TABS: 2.0000 | ORAL_TABLET | Freq: Every day | ORAL | Status: DC

## 2015-12-28 MED ORDER — TRAMADOL HCL 50 MG PO TABS: ORAL_TABLET | ORAL | Status: DC

## 2015-12-28 NOTE — Telephone Encounter (Signed)
Last see and filled 09/07/15 #90 with 1 refill.   Please advise    KP

## 2016-01-09 ENCOUNTER — Ambulatory Visit (INDEPENDENT_AMBULATORY_CARE_PROVIDER_SITE_OTHER): Payer: Medicare Other | Admitting: *Deleted

## 2016-01-09 DIAGNOSIS — I442 Atrioventricular block, complete: Secondary | ICD-10-CM

## 2016-01-09 DIAGNOSIS — Z950 Presence of cardiac pacemaker: Secondary | ICD-10-CM | POA: Diagnosis not present

## 2016-01-09 NOTE — Progress Notes (Signed)
Remote pacemaker transmission.   

## 2016-01-12 ENCOUNTER — Encounter: Payer: Self-pay | Admitting: Cardiology

## 2016-01-12 LAB — CUP PACEART REMOTE DEVICE CHECK
Battery Remaining Longevity: 98 mo
Brady Statistic AP VP Percent: 2 %
Brady Statistic AP VS Percent: 0 %
Implantable Lead Implant Date: 20041110
Implantable Lead Location: 753859
Lead Channel Pacing Threshold Amplitude: 1.125 V
Lead Channel Sensing Intrinsic Amplitude: 2.8 mV
Lead Channel Setting Pacing Pulse Width: 0.4 ms
MDC IDC LEAD IMPLANT DT: 20041110
MDC IDC LEAD LOCATION: 753860
MDC IDC MSMT BATTERY IMPEDANCE: 276 Ohm
MDC IDC MSMT BATTERY VOLTAGE: 2.78 V
MDC IDC MSMT LEADCHNL RA IMPEDANCE VALUE: 526 Ohm
MDC IDC MSMT LEADCHNL RA PACING THRESHOLD AMPLITUDE: 0.5 V
MDC IDC MSMT LEADCHNL RA PACING THRESHOLD PULSEWIDTH: 0.4 ms
MDC IDC MSMT LEADCHNL RV IMPEDANCE VALUE: 402 Ohm
MDC IDC MSMT LEADCHNL RV PACING THRESHOLD PULSEWIDTH: 0.4 ms
MDC IDC SESS DTM: 20170711124518
MDC IDC SET LEADCHNL RA PACING AMPLITUDE: 2 V
MDC IDC SET LEADCHNL RV PACING AMPLITUDE: 2.5 V
MDC IDC SET LEADCHNL RV SENSING SENSITIVITY: 4 mV
MDC IDC STAT BRADY AS VP PERCENT: 98 %
MDC IDC STAT BRADY AS VS PERCENT: 0 %

## 2016-02-22 ENCOUNTER — Encounter: Payer: Self-pay | Admitting: Internal Medicine

## 2016-03-07 ENCOUNTER — Encounter: Payer: Self-pay | Admitting: Internal Medicine

## 2016-03-07 ENCOUNTER — Ambulatory Visit (INDEPENDENT_AMBULATORY_CARE_PROVIDER_SITE_OTHER): Payer: Medicare Other | Admitting: Internal Medicine

## 2016-03-07 ENCOUNTER — Other Ambulatory Visit: Payer: Self-pay | Admitting: Urology

## 2016-03-07 VITALS — BP 154/84 | HR 72 | Ht 64.0 in | Wt 138.4 lb

## 2016-03-07 DIAGNOSIS — I472 Ventricular tachycardia, unspecified: Secondary | ICD-10-CM

## 2016-03-07 DIAGNOSIS — Z950 Presence of cardiac pacemaker: Secondary | ICD-10-CM

## 2016-03-07 DIAGNOSIS — I442 Atrioventricular block, complete: Secondary | ICD-10-CM | POA: Diagnosis not present

## 2016-03-07 MED FILL — AMLODIPINE BESYLATE 5 MG TA: 5 | 90 days supply | Qty: 90 | Fill #2

## 2016-03-07 NOTE — Progress Notes (Addendum)
Patient Care Team: [name], DO as PCP - General (Family Medicine) Patsey Berthold, NP as Nurse Practitioner (Cardiology) Franchot Gallo, MD as Consulting Physician (Urology)   HPI  [name] is a 75 y.o. female Seen in followup for a pacemaker implanted for high-grade heart block which reached ERI 3/13 at that time underwent generator replacement.  Because of some dypsnea she underwent echo >>4/16 normal LV function 10/16 Myoview normal LV function and no ischemia Renal function Cr 0.7  9/15  The patient denies chest pain, shortness of breath, nocturnal dyspnea, orthopnea or peripheral edema.  There have been no palpitations, lightheadedness or syncope.     Past Medical History:  Diagnosis Date  . Atrioventricular block, complete ([place])    a. 08/2011 Upgrage of PPM to MDT Adapta L Dual Chamber PPM ser # [id number] H.  . Cardiac pacemaker in situ   . Contact dermatitis and eczema    unspec cause  . Dairy product intolerance   . Depression   . Diastolic dysfunction   . GERD (gastroesophageal reflux disease)   . Hiatal hernia   . History of CHF (congestive heart failure)   . History of diverticulitis of colon    2003  perforated diverticulitis w/ surgical intervention  . History of kidney stones    age 23  . History of peripheral edema    lower extremities  . History of thyroid nodule    multinodular goiter  s/p  total thyroidectomy 2014  . HTN (hypertension)   . Hydronephrosis, left   . Hypothyroidism   . Mild sleep apnea    per study 04-14-2014  . Nocturia   . Osteoporosis   . PAF (paroxysmal atrial fibrillation) ([place])   . PONV (postoperative nausea and vomiting)   . Pulmonary nodules    stable per last ct  . Renal cell carcinoma of left kidney (HCC)   . Shortness of breath    intermittant  . Superficial thrombophlebitis     Past Surgical History:  Procedure Laterality Date  . CARDIOVASCULAR STRESS TEST  04-20-2015  dr Caryl Comes   normal nuclear  study/  no ischemia/  normal LV function and wall motion, ef 74%  . COLONOSCOPY  01/19/2013  . COLOSTOMY TAKEDOWN  07-22-2002  . CYSTOSCOPY WITH RETROGRADE PYELOGRAM, URETEROSCOPY AND STENT PLACEMENT Left 07/28/2015   Procedure: CYSTOSCOPY WITH LEFT RETROGRADE PYELOGRAM, POSSIBLE LEFT URETEROSCOPY AND STENT PLACEMENT;  Surgeon: Franchot Gallo, MD;  Location: [facility];  Service: Urology;  Laterality: Left;  . EXPLORATORY LAPARTOMY SIGMOID COLECTOMY/ COLOSTOMY  04-28-2002   perforated diverticulitis  . LAPAROSCOPY VENTRAL HERNIA REPAIR/  EXTENSIVE LYSIS ADHESIONS  05-04-2004  . PACEMAKER GENERATOR CHANGE Left 09/16/2011   MDT [place] pacemaker  . PACEMAKER PLACEMENT  05-11-2003     medtronic  . THYROIDECTOMY N/A 08/07/2012   Procedure: THYROIDECTOMY;  Surgeon: Earnstine Regal, MD;  Location: [facility];  Service: General;  Laterality: N/A;  . TRANSTHORACIC ECHOCARDIOGRAM  10-12-2014   moderate focal basal LVH,  ef 55-60%,  grade 2 diastolic dysfunction, mild AV calcification without stenosis,  mild MR,  trivial PR    Current Outpatient Prescriptions  Medication Sig Dispense Refill  . amLODipine (NORVASC) 5 MG tablet Take 1 tablet (5 mg total) by mouth daily. 90 tablet 3  . Calcium Carbonate-Vitamin D (OS-CAL 500 + D PO) Take 1 tablet by mouth 3 (three) times daily.    . Chlorpheniramine-APAP (CORICIDIN) 2-325 MG TABS Take 2 tablets by mouth 2 (  two) times daily as needed (allergies).    [name] denosumab (PROLIA) 60 MG/ML SOLN Inject 60 mg into the skin every 6 (six) months. Reported on 09/07/2015    . fluticasone (FLONASE) 50 MCG/ACT nasal spray Place 2 sprays into both nostrils daily as needed for allergies or rhinitis.    [name] levothyroxine (SYNTHROID, LEVOTHROID) 88 MCG tablet TAKE ONE TABLET BY MOUTH ONCE DAILY BEFORE BREAKFAST 90 tablet 3  . lisinopril-hydrochlorothiazide (ZESTORETIC) 20-12.5 MG tablet Take 2 tablets by mouth daily. 180 tablet 3  . loratadine (CLARITIN) 10 MG tablet Take  1 tablet (10 mg total) by mouth daily. 90 tablet 3  . Multiple Vitamin (MULTIVITAMIN WITH MINERALS) TABS tablet Take 1 tablet by mouth daily.    [name] omeprazole (PRILOSEC) 20 MG capsule Take 20 mg by mouth daily as needed (for heartburn and indigestion).    . traMADol (ULTRAM) 50 MG tablet Take 50-100 mg by mouth every 6 (six) hours as needed (for pain).     No current facility-administered medications for this visit.    Facility-Administered Medications Ordered in Other Visits  Medication Dose Route Frequency Provider Last Rate Last Dose  . aminophylline injection 150 mg  150 mg Intravenous BID PRN Dorothy Spark, MD   150 mg at 04/20/15 1200    Allergies  Allergen Reactions  . Tape Rash  . Codeine Nausea And Vomiting    Review of Systems negative except from HPI and PMH  Physical Exam BP (!) 154/84   Pulse 72   Ht 5\' 4"  (1.626 m)   Wt 138 lb 6.4 oz (62.8 kg)   SpO2 97%   BMI 23.76 kg/m  Well developed and well nourished in no acute distress HENT normal E scleral and icterus clear Neck Supple; scar JVP flat; carotids brisk and full Clear to ausculation Device pocket well healed; without hematoma or erythema Regular rate and rhythm, no murmurs gallops or rub Soft with active bowel sounds No clubbing cyanosis noEdema Alert and oriented, grossly normal motor and sensory function Skin Warm and Dry  ECG demonstrates sinus rhythm at about 80 with P synchronous pacing unipolar pacing Assessment and  Plan  Complete heart block   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  The information was reviewed. No changes were made in the programming.    Preoperative clearance    Blood pressure  higher in the office but normal home.   The device had undergone a polarity switch. We will maintain it in the unipolar mode  Should be at acceptable risk for surgery  Will need  protocol for pacer management--will need magnet

## 2016-03-07 NOTE — Patient Instructions (Signed)
Medication Instructions:    Your physician recommends that you continue on your current medications as directed. Please refer to the Current Medication list given to you today.  --- If you need a refill on your cardiac medications before your next appointment, please call your pharmacy. ---  Labwork:  None ordered  Testing/Procedures:  None ordered  Follow-Up: Remote monitoring is used to monitor your Pacemaker of ICD from home. This monitoring reduces the number of office visits required to check your device to one time per year. It allows [place] to keep an eye on the functioning of your device to ensure it is working properly. You are scheduled for a device check from home on 06/06/2016. You may send your transmission at any time that day. If you have a wireless device, the transmission will be sent automatically. After your physician reviews your transmission, you will receive a postcard with your next transmission date.   Your physician wants you to follow-up in: 1 year with Dr. Caryl Comes.  You will receive a reminder letter in the mail two months in advance. If you don't receive a letter, please call our office to schedule the follow-up appointment.   Thank you for choosing [facility]!!

## 2016-03-08 LAB — CUP PACEART INCLINIC DEVICE CHECK
Battery Impedance: 300 Ohm
Battery Voltage: 2.78 V
Brady Statistic AP VS Percent: 0 %
Brady Statistic AS VP Percent: 98 %
Date Time Interrogation Session: 20170907182716
Implantable Lead Location: 753859
Lead Channel Impedance Value: 542 Ohm
Lead Channel Pacing Threshold Amplitude: 0.5 V
Lead Channel Pacing Threshold Amplitude: 1 V
Lead Channel Pacing Threshold Amplitude: 1.125 V
Lead Channel Pacing Threshold Pulse Width: 0.4 ms
Lead Channel Pacing Threshold Pulse Width: 0.4 ms
Lead Channel Sensing Intrinsic Amplitude: 2.8 mV
Lead Channel Sensing Intrinsic Amplitude: 5.6 mV
Lead Channel Setting Pacing Amplitude: 2 V
Lead Channel Setting Pacing Pulse Width: 0.4 ms
MDC IDC LEAD IMPLANT DT: 20041110
MDC IDC LEAD IMPLANT DT: 20041110
MDC IDC LEAD LOCATION: 753860
MDC IDC MSMT BATTERY REMAINING LONGEVITY: 96 mo
MDC IDC MSMT LEADCHNL RA PACING THRESHOLD AMPLITUDE: 0.5 V
MDC IDC MSMT LEADCHNL RA PACING THRESHOLD PULSEWIDTH: 0.4 ms
MDC IDC MSMT LEADCHNL RV IMPEDANCE VALUE: 414 Ohm
MDC IDC MSMT LEADCHNL RV PACING THRESHOLD PULSEWIDTH: 0.4 ms
MDC IDC SET LEADCHNL RV PACING AMPLITUDE: 2.5 V
MDC IDC SET LEADCHNL RV SENSING SENSITIVITY: 4 mV
MDC IDC STAT BRADY AP VP PERCENT: 2 %
MDC IDC STAT BRADY AS VS PERCENT: 0 %

## 2016-03-11 NOTE — Patient Instructions (Addendum)
[name]  03/11/2016   Your procedure is scheduled on: Wednesday 03/20/2016  Report to [facility]  Entrance take [name]  elevators to 3rd floor to  [place] at  [place] AM.  Call this number if you have problems the morning of surgery [phone]   Remember: ONLY 1 PERSON MAY GO WITH YOU TO SHORT STAY TO GET  READY MORNING OF [name].              FOLLOW DR. MANNY'S BOWEL PREP INSTRUCTIONS WITH A CLEAR LIQUID DIET THE DAY BEFORE SURGERY ON Tuesday 03/19/2016!             TAKE THE BOTTLE OF MAGNESIUM CITRATE THE DAY BEFORE SURGERY BY 12 NOON! FOLLOW CLEAR LIQUID DIET ALL DAY!   CLEAR LIQUID DIET   Foods Allowed                                                                     Foods Excluded  Coffee and tea, regular and decaf                             liquids that you cannot  Plain Jell-O in any flavor                                             see through such as: Fruit ices (not with fruit pulp)                                     milk, soups, orange juice  Iced Popsicles                                    All solid food Carbonated beverages, regular and diet                                    Cranberry, grape and apple juices Sports drinks like Gatorade Lightly seasoned clear broth or consume(fat free) Sugar, honey syrup  Sample Menu Breakfast                                Lunch                                     Supper Cranberry juice                    Beef broth                            Chicken broth Jell-O  Grape juice                           Apple juice Coffee or tea                        Jell-O                                      Popsicle                                                Coffee or tea                        Coffee or tea  _____________________________________________________________________      Do not eat food or drink liquids :After Midnight.     Take these  medicines the morning of surgery with A SIP OF WATER: Levothyroxine, Amlodipine, may use Claritin and Flonase nasal spray if needed                                You may not have any metal on your body including hair pins and              piercings  Do not wear jewelry, make-up, lotions, powders or perfumes, deodorant             Do not wear nail polish.  Do not shave  48 hours prior to surgery.              Men may shave face and neck.   Do not bring valuables to the hospital. [name].  Contacts, dentures or bridgework may not be worn into surgery.  Leave suitcase in the car. After surgery it may be brought to your room.                  Please read over the following fact sheets you were given: _____________________________________________________________________             [facility] - Preparing for Surgery Before surgery, you can play an important role.  Because skin is not sterile, your skin needs to be as free of germs as possible.  You can reduce the number of germs on your skin by washing with CHG (chlorahexidine gluconate) soap before surgery.  CHG is an antiseptic cleaner which kills germs and bonds with the skin to continue killing germs even after washing. Please DO NOT use if you have an allergy to CHG or antibacterial soaps.  If your skin becomes reddened/irritated stop using the CHG and inform your nurse when you arrive at Short Stay. Do not shave (including legs and underarms) for at least 48 hours prior to the first CHG shower.  You may shave your face/neck. Please follow these instructions carefully:  1.  Shower with CHG Soap the night before surgery and the  morning of Surgery.  2.  If you choose to wash your hair, wash your hair first as usual with your  normal  shampoo.  3.  After you shampoo, rinse your hair and body thoroughly to remove the  shampoo.                           4.  Use CHG as you would any other  liquid soap.  You can apply chg directly  to the skin and wash                       Gently with a scrungie or clean washcloth.  5.  Apply the CHG Soap to your body ONLY FROM THE NECK DOWN.   Do not use on face/ open                           Wound or open sores. Avoid contact with eyes, ears mouth and genitals (private parts).                       Wash face,  Genitals (private parts) with your normal soap.             6.  Wash thoroughly, paying special attention to the area where your surgery  will be performed.  7.  Thoroughly rinse your body with warm water from the neck down.  8.  DO NOT shower/wash with your normal soap after using and rinsing off  the CHG Soap.                9.  Pat yourself dry with a clean towel.            10.  Wear clean pajamas.            11.  Place clean sheets on your bed the night of your first shower and do not  sleep with pets. Day of Surgery : Do not apply any lotions/deodorants the morning of surgery.  Please wear clean clothes to the hospital/surgery center.  FAILURE TO FOLLOW THESE INSTRUCTIONS MAY RESULT IN THE CANCELLATION OF YOUR SURGERY PATIENT SIGNATURE_________________________________  NURSE SIGNATURE__________________________________  ________________________________________________________________________   [name]  An incentive spirometer is a tool that can help keep your lungs clear and active. This tool measures how well you are filling your lungs with each breath. Taking long deep breaths may help reverse or decrease the chance of developing breathing (pulmonary) problems (especially infection) following:  A long period of time when you are unable to move or be active. BEFORE THE PROCEDURE   If the spirometer includes an indicator to show your best effort, your nurse or respiratory therapist will set it to a desired goal.  If possible, sit up straight or lean slightly forward. Try not to slouch.  Hold the incentive  spirometer in an upright position. INSTRUCTIONS FOR USE  1. Sit on the edge of your bed if possible, or sit up as far as you can in bed or on a chair. 2. Hold the incentive spirometer in an upright position. 3. Breathe out normally. 4. Place the mouthpiece in your mouth and seal your lips tightly around it. 5. Breathe in slowly and as deeply as possible, raising the piston or the ball toward the top of the column. 6. Hold your breath for 3-5 seconds or for as long as possible. Allow the piston or ball to fall to the bottom of the column. 7. Remove the mouthpiece from your mouth and breathe out normally. 8.  Rest for a few seconds and repeat Steps 1 through 7 at least 10 times every 1-2 hours when you are awake. Take your time and take a few normal breaths between deep breaths. 9. The spirometer may include an indicator to show your best effort. Use the indicator as a goal to work toward during each repetition. 10. After each set of 10 deep breaths, practice coughing to be sure your lungs are clear. If you have an incision (the cut made at the time of surgery), support your incision when coughing by placing a pillow or rolled up towels firmly against it. Once you are able to get out of bed, walk around indoors and cough well. You may stop using the incentive spirometer when instructed by your caregiver.  RISKS AND COMPLICATIONS  Take your time so you do not get dizzy or light-headed.  If you are in pain, you may need to take or ask for pain medication before doing incentive spirometry. It is harder to take a deep breath if you are having pain. AFTER USE  Rest and breathe slowly and easily.  It can be helpful to keep track of a log of your progress. Your caregiver can provide you with a simple table to help with this. If you are using the spirometer at home, follow these instructions: [place] IF:   You are having difficultly using the spirometer.  You have trouble using the  spirometer as often as instructed.  Your pain medication is not giving enough relief while using the spirometer.  You develop fever of 100.5 F (38.1 C) or higher. SEEK IMMEDIATE MEDICAL CARE IF:   You cough up bloody sputum that had not been present before.  You develop fever of 102 F (38.9 C) or greater.  You develop worsening pain at or near the incision site. MAKE SURE YOU:   Understand these instructions.  Will watch your condition.  Will get help right away if you are not doing well or get worse. Document Released: 10/28/2006 Document Revised: 09/09/2011 Document Reviewed: 12/29/2006 ExitCare Patient Information 2014 ExitCare, [place].   ________________________________________________________________________  WHAT IS A BLOOD TRANSFUSION? Blood Transfusion Information  A transfusion is the replacement of blood or some of its parts. Blood is made up of multiple cells which provide different functions.  Red blood cells carry oxygen and are used for blood loss replacement.  White blood cells fight against infection.  Platelets control bleeding.  Plasma helps clot blood.  Other blood products are available for specialized needs, such as hemophilia or other clotting disorders. BEFORE THE TRANSFUSION  Who gives blood for transfusions?   Healthy volunteers who are fully evaluated to make sure their blood is safe. This is blood bank blood. Transfusion therapy is the safest it has ever been in the practice of medicine. Before blood is taken from a donor, a complete history is taken to make sure that person has no history of diseases nor engages in risky social behavior (examples are intravenous drug use or sexual activity with multiple partners). The donor's travel history is screened to minimize risk of transmitting infections, such as malaria. The donated blood is tested for signs of infectious diseases, such as HIV and hepatitis. The blood is then tested to be sure it is  compatible with you in order to minimize the chance of a transfusion reaction. If you or a relative donates blood, this is often done in anticipation of surgery and is not appropriate for emergency situations. It takes  many days to process the donated blood. RISKS AND COMPLICATIONS Although transfusion therapy is very safe and saves many lives, the main dangers of transfusion include:   Getting an infectious disease.  Developing a transfusion reaction. This is an allergic reaction to something in the blood you were given. Every precaution is taken to prevent this. The decision to have a blood transfusion has been considered carefully by your caregiver before blood is given. Blood is not given unless the benefits outweigh the risks. AFTER THE TRANSFUSION  Right after receiving a blood transfusion, you will usually feel much better and more energetic. This is especially true if your red blood cells have gotten low (anemic). The transfusion raises the level of the red blood cells which carry oxygen, and this usually causes an energy increase.  The nurse administering the transfusion will monitor you carefully for complications. HOME CARE INSTRUCTIONS  No special instructions are needed after a transfusion. You may find your energy is better. Speak with your caregiver about any limitations on activity for underlying diseases you may have. SEEK MEDICAL CARE IF:   Your condition is not improving after your transfusion.  You develop redness or irritation at the intravenous (IV) site. SEEK IMMEDIATE MEDICAL CARE IF:  Any of the following symptoms occur over the next 12 hours:  Shaking chills.  You have a temperature by mouth above 102 F (38.9 C), not controlled by medicine.  Chest, back, or muscle pain.  People around you feel you are not acting correctly or are confused.  Shortness of breath or difficulty breathing.  Dizziness and fainting.  You get a rash or develop hives.  You have  a decrease in urine output.  Your urine turns a dark color or changes to pink, red, or brown. Any of the following symptoms occur over the next 10 days:  You have a temperature by mouth above 102 F (38.9 C), not controlled by medicine.  Shortness of breath.  Weakness after normal activity.  The white part of the eye turns yellow (jaundice).  You have a decrease in the amount of urine or are urinating less often.  Your urine turns a dark color or changes to pink, red, or brown. Document Released: 06/14/2000 Document Revised: 09/09/2011 Document Reviewed: 02/01/2008 [facility] Patient Information 2014 [place], [place].  _______________________________________________________________________

## 2016-03-12 ENCOUNTER — Encounter (HOSPITAL_COMMUNITY): Payer: Self-pay

## 2016-03-12 ENCOUNTER — Encounter (HOSPITAL_COMMUNITY)
Admission: RE | Admit: 2016-03-12 | Discharge: 2016-03-12 | Disposition: A | Discharge status: Home / Self Care | Payer: Medicare Other | Source: Ambulatory Visit | Attending: Urology | Admitting: Urology

## 2016-03-12 DIAGNOSIS — Z01812 Encounter for preprocedural laboratory examination: Secondary | ICD-10-CM | POA: Insufficient documentation

## 2016-03-12 LAB — CBC
HEMATOCRIT: 36.3 % (ref 36.0–46.0)
HEMOGLOBIN: 11.7 g/dL — AB (ref 12.0–15.0)
MCH: 27.4 pg (ref 26.0–34.0)
MCHC: 32.2 g/dL (ref 30.0–36.0)
MCV: 85 fL (ref 78.0–100.0)
Platelets: 151 10*3/uL (ref 150–400)
RBC: 4.27 MIL/uL (ref 3.87–5.11)
RDW: 12.5 % (ref 11.5–15.5)
WBC: 4.2 10*3/uL (ref 4.0–10.5)

## 2016-03-12 LAB — ABO/RH: ABO/RH(D): O POS

## 2016-03-12 LAB — BASIC METABOLIC PANEL
ANION GAP: 9 (ref 5–15)
BUN: 21 mg/dL — AB (ref 6–20)
CHLORIDE: 100 mmol/L — AB (ref 101–111)
CO2: 28 mmol/L (ref 22–32)
Calcium: 9.7 mg/dL (ref 8.9–10.3)
Creatinine, Ser: 1.08 mg/dL — ABNORMAL HIGH (ref 0.44–1.00)
GFR calc non Af Amer: 48 mL/min — ABNORMAL LOW (ref >60)
GFR, EST AFRICAN AMERICAN: 56 mL/min — AB (ref >60)
Glucose, Bld: 101 mg/dL — ABNORMAL HIGH (ref 65–99)
POTASSIUM: 3.9 mmol/L (ref 3.5–5.1)
SODIUM: 137 mmol/L (ref 135–145)

## 2016-03-12 LAB — SURGICAL PCR SCREEN
MRSA, PCR: NEGATIVE
Staphylococcus aureus: NEGATIVE

## 2016-03-12 NOTE — Progress Notes (Signed)
03/07/2016-NOTED IN epic-  EKG, Device check for pacemaker and note . Cardiac Device orders on chart from Dr. Caryl Comes.

## 2016-03-13 ENCOUNTER — Telehealth: Payer: Self-pay | Admitting: Family Medicine

## 2016-03-13 ENCOUNTER — Other Ambulatory Visit: Payer: Self-pay | Admitting: Family Medicine

## 2016-03-13 DIAGNOSIS — J069 Acute upper respiratory infection, unspecified: Secondary | ICD-10-CM

## 2016-03-13 MED FILL — SM ALLERGY RELIEF 50 MCG SP: 50 MCG | 30 days supply | Qty: 16 | Fill #0

## 2016-03-13 MED FILL — LORATADINE 10 MG TABLET: 10 | 100 days supply | Qty: 100 | Fill #2

## 2016-03-13 NOTE — Telephone Encounter (Signed)
Please advise if we should reach out to patient for next Prolia injection?

## 2016-03-13 NOTE — Telephone Encounter (Signed)
She has been seeing cardiology Reach out to her-- if she is due we need to at least ask her

## 2016-03-14 NOTE — Telephone Encounter (Signed)
In Jan the patient still had her stent and Dr.Dahlstedt advised not to continue the Prolia due to her kidney functions.    KP

## 2016-03-14 NOTE — Telephone Encounter (Signed)
noted 

## 2016-03-19 ENCOUNTER — Ambulatory Visit: Payer: Medicare Other

## 2016-03-20 ENCOUNTER — Inpatient Hospital Stay (HOSPITAL_COMMUNITY): Payer: Medicare Other | Admitting: Certified Registered Nurse Anesthetist

## 2016-03-20 ENCOUNTER — Encounter (HOSPITAL_COMMUNITY): Payer: Self-pay | Admitting: *Deleted

## 2016-03-20 ENCOUNTER — Encounter (HOSPITAL_COMMUNITY)
Admission: RE | Disposition: A | Discharge status: Home / Self Care | Payer: Self-pay | Source: Ambulatory Visit | Attending: Urology

## 2016-03-20 ENCOUNTER — Inpatient Hospital Stay (HOSPITAL_COMMUNITY)
Admission: RE | Admit: 2016-03-20 | Discharge: 2016-03-22 | DRG: 657 | Disposition: A | Discharge status: Home / Self Care | Payer: Medicare Other | Source: Ambulatory Visit | Attending: Urology | Admitting: Urology

## 2016-03-20 DIAGNOSIS — K219 Gastro-esophageal reflux disease without esophagitis: Secondary | ICD-10-CM | POA: Diagnosis not present

## 2016-03-20 DIAGNOSIS — Z823 Family history of stroke: Secondary | ICD-10-CM

## 2016-03-20 DIAGNOSIS — Z23 Encounter for immunization: Secondary | ICD-10-CM

## 2016-03-20 DIAGNOSIS — E039 Hypothyroidism, unspecified: Secondary | ICD-10-CM | POA: Diagnosis not present

## 2016-03-20 DIAGNOSIS — M810 Age-related osteoporosis without current pathological fracture: Secondary | ICD-10-CM | POA: Diagnosis not present

## 2016-03-20 DIAGNOSIS — N261 Atrophy of kidney (terminal): Secondary | ICD-10-CM | POA: Diagnosis not present

## 2016-03-20 DIAGNOSIS — K449 Diaphragmatic hernia without obstruction or gangrene: Secondary | ICD-10-CM | POA: Diagnosis not present

## 2016-03-20 DIAGNOSIS — Z8672 Personal history of thrombophlebitis: Secondary | ICD-10-CM | POA: Diagnosis not present

## 2016-03-20 DIAGNOSIS — Z9049 Acquired absence of other specified parts of digestive tract: Secondary | ICD-10-CM

## 2016-03-20 DIAGNOSIS — G4730 Sleep apnea, unspecified: Secondary | ICD-10-CM | POA: Diagnosis present

## 2016-03-20 DIAGNOSIS — Z8249 Family history of ischemic heart disease and other diseases of the circulatory system: Secondary | ICD-10-CM

## 2016-03-20 DIAGNOSIS — N2889 Other specified disorders of kidney and ureter: Secondary | ICD-10-CM | POA: Diagnosis present

## 2016-03-20 DIAGNOSIS — Z87891 Personal history of nicotine dependence: Secondary | ICD-10-CM | POA: Diagnosis not present

## 2016-03-20 DIAGNOSIS — I110 Hypertensive heart disease with heart failure: Secondary | ICD-10-CM | POA: Diagnosis present

## 2016-03-20 DIAGNOSIS — F329 Major depressive disorder, single episode, unspecified: Secondary | ICD-10-CM | POA: Diagnosis not present

## 2016-03-20 DIAGNOSIS — Z950 Presence of cardiac pacemaker: Secondary | ICD-10-CM

## 2016-03-20 DIAGNOSIS — I5032 Chronic diastolic (congestive) heart failure: Secondary | ICD-10-CM | POA: Diagnosis not present

## 2016-03-20 DIAGNOSIS — D3002 Benign neoplasm of left kidney: Principal | ICD-10-CM | POA: Diagnosis present

## 2016-03-20 DIAGNOSIS — Z87442 Personal history of urinary calculi: Secondary | ICD-10-CM | POA: Diagnosis not present

## 2016-03-20 DIAGNOSIS — I739 Peripheral vascular disease, unspecified: Secondary | ICD-10-CM | POA: Diagnosis not present

## 2016-03-20 HISTORY — PX: ROBOT ASSISTED LAPAROSCOPIC NEPHRECTOMY: SHX5140

## 2016-03-20 LAB — HEMOGLOBIN AND HEMATOCRIT, BLOOD
HCT: 36.7 % (ref 36.0–46.0)
HEMOGLOBIN: 11.9 g/dL — AB (ref 12.0–15.0)

## 2016-03-20 LAB — TYPE AND SCREEN
ABO/RH(D): O POS
Antibody Screen: NEGATIVE

## 2016-03-20 SURGERY — NEPHRECTOMY, RADICAL, ROBOT-ASSISTED, LAPAROSCOPIC, ADULT
Anesthesia: General | Laterality: Left

## 2016-03-20 MED ORDER — FENTANYL CITRATE (PF) 100 MCG/2ML IJ SOLN
INTRAMUSCULAR | Status: AC
  Filled 2016-03-20: qty 2

## 2016-03-20 MED ORDER — OXYCODONE HCL 5 MG PO TABS
5.0000 mg | ORAL_TABLET | ORAL | Status: DC | PRN
  Administered 2016-03-21 – 2016-03-22 (×5): 5 mg via ORAL
  Filled 2016-03-20 (×5): qty 1

## 2016-03-20 MED ORDER — ONDANSETRON HCL 4 MG/2ML IJ SOLN
INTRAMUSCULAR | Status: DC | PRN
  Administered 2016-03-20: 4 mg via INTRAVENOUS

## 2016-03-20 MED ORDER — PROPOFOL 10 MG/ML IV BOLUS
INTRAVENOUS | Status: DC | PRN
  Administered 2016-03-20: 100 mg via INTRAVENOUS

## 2016-03-20 MED ORDER — DEXAMETHASONE SODIUM PHOSPHATE 10 MG/ML IJ SOLN
INTRAMUSCULAR | Status: AC
  Filled 2016-03-20: qty 1

## 2016-03-20 MED ORDER — LEVOTHYROXINE SODIUM 88 MCG PO TABS
88.0000 ug | ORAL_TABLET | Freq: Every day | ORAL | Status: DC
  Administered 2016-03-21 – 2016-03-22 (×2): 88 ug via ORAL
  Filled 2016-03-20 (×2): qty 1

## 2016-03-20 MED ORDER — ACETAMINOPHEN 500 MG PO TABS
1000.0000 mg | ORAL_TABLET | Freq: Four times a day (QID) | ORAL | Status: AC
  Administered 2016-03-20 – 2016-03-21 (×4): 1000 mg via ORAL
  Filled 2016-03-20 (×4): qty 2

## 2016-03-20 MED ORDER — HYDROMORPHONE HCL 1 MG/ML IJ SOLN
0.2500 mg | INTRAMUSCULAR | Status: DC | PRN
  Administered 2016-03-20 (×4): 0.5 mg via INTRAVENOUS

## 2016-03-20 MED ORDER — BUPIVACAINE LIPOSOME 1.3 % IJ SUSP
INTRAMUSCULAR | Status: DC | PRN
  Administered 2016-03-20: 20 mL

## 2016-03-20 MED ORDER — FENTANYL CITRATE (PF) 100 MCG/2ML IJ SOLN
INTRAMUSCULAR | Status: AC
  Filled 2016-03-20: qty 4

## 2016-03-20 MED ORDER — FENTANYL CITRATE (PF) 100 MCG/2ML IJ SOLN
INTRAMUSCULAR | Status: DC | PRN
  Administered 2016-03-20: 50 ug via INTRAVENOUS
  Administered 2016-03-20 (×2): 25 ug via INTRAVENOUS
  Administered 2016-03-20 (×4): 50 ug via INTRAVENOUS
  Administered 2016-03-20 (×2): 25 ug via INTRAVENOUS
  Administered 2016-03-20: 50 ug via INTRAVENOUS

## 2016-03-20 MED ORDER — PANTOPRAZOLE SODIUM 40 MG PO TBEC
40.0000 mg | DELAYED_RELEASE_TABLET | Freq: Every day | ORAL | Status: DC
  Administered 2016-03-21 – 2016-03-22 (×2): 40 mg via ORAL
  Filled 2016-03-20 (×2): qty 1

## 2016-03-20 MED ORDER — HYDROCODONE-ACETAMINOPHEN 5-325 MG PO TABS: 1.0000 | ORAL_TABLET | Freq: Four times a day (QID) | ORAL | 0 refills | Status: DC | PRN

## 2016-03-20 MED ORDER — ONDANSETRON HCL 4 MG/2ML IJ SOLN
4.0000 mg | INTRAMUSCULAR | Status: DC | PRN
  Administered 2016-03-20 (×2): 4 mg via INTRAVENOUS
  Filled 2016-03-20 (×2): qty 2

## 2016-03-20 MED ORDER — SUGAMMADEX SODIUM 200 MG/2ML IV SOLN
INTRAVENOUS | Status: DC | PRN
  Administered 2016-03-20: 200 mg via INTRAVENOUS

## 2016-03-20 MED ORDER — ROCURONIUM BROMIDE 10 MG/ML (PF) SYRINGE
PREFILLED_SYRINGE | INTRAVENOUS | Status: AC
  Filled 2016-03-20: qty 10

## 2016-03-20 MED ORDER — DIPHENHYDRAMINE HCL 12.5 MG/5ML PO ELIX: 12.5000 mg | ORAL_SOLUTION | Freq: Four times a day (QID) | ORAL | Status: DC | PRN

## 2016-03-20 MED ORDER — ONDANSETRON HCL 4 MG/2ML IJ SOLN: 4.0000 mg | Freq: Four times a day (QID) | INTRAMUSCULAR | Status: DC | PRN

## 2016-03-20 MED ORDER — SUGAMMADEX SODIUM 200 MG/2ML IV SOLN
INTRAVENOUS | Status: AC
  Filled 2016-03-20: qty 2

## 2016-03-20 MED ORDER — BUPIVACAINE LIPOSOME 1.3 % IJ SUSP
INTRAMUSCULAR | Status: AC
  Filled 2016-03-20: qty 20

## 2016-03-20 MED ORDER — EPHEDRINE 5 MG/ML INJ
INTRAVENOUS | Status: AC
  Filled 2016-03-20: qty 10

## 2016-03-20 MED ORDER — CEFAZOLIN SODIUM-DEXTROSE 2-4 GM/100ML-% IV SOLN
2.0000 g | INTRAVENOUS | Status: AC
  Administered 2016-03-20: 2 g via INTRAVENOUS

## 2016-03-20 MED ORDER — HYDROMORPHONE HCL 1 MG/ML IJ SOLN
INTRAMUSCULAR | Status: AC
  Filled 2016-03-20: qty 1

## 2016-03-20 MED ORDER — DEXAMETHASONE SODIUM PHOSPHATE 10 MG/ML IJ SOLN
INTRAMUSCULAR | Status: DC | PRN
  Administered 2016-03-20: 10 mg via INTRAVENOUS

## 2016-03-20 MED ORDER — SODIUM CHLORIDE 0.9 % IJ SOLN
INTRAMUSCULAR | Status: DC | PRN
  Administered 2016-03-20: 20 mL

## 2016-03-20 MED ORDER — SUCCINYLCHOLINE CHLORIDE 20 MG/ML IJ SOLN
INTRAMUSCULAR | Status: DC | PRN
  Administered 2016-03-20: 100 mg via INTRAVENOUS

## 2016-03-20 MED ORDER — AMLODIPINE BESYLATE 5 MG PO TABS
5.0000 mg | ORAL_TABLET | Freq: Every day | ORAL | Status: DC
  Administered 2016-03-21 – 2016-03-22 (×2): 5 mg via ORAL
  Filled 2016-03-20 (×2): qty 1

## 2016-03-20 MED ORDER — HYDROMORPHONE HCL 1 MG/ML IJ SOLN
0.5000 mg | INTRAMUSCULAR | Status: DC | PRN
  Administered 2016-03-20 (×2): 1 mg via INTRAVENOUS
  Filled 2016-03-20 (×3): qty 1

## 2016-03-20 MED ORDER — CEFAZOLIN SODIUM-DEXTROSE 2-4 GM/100ML-% IV SOLN
INTRAVENOUS | Status: AC
Start: 2016-03-20 — End: 2016-03-20
  Filled 2016-03-20: qty 100

## 2016-03-20 MED ORDER — HYDROMORPHONE HCL 1 MG/ML IJ SOLN
INTRAMUSCULAR | Status: AC
Start: 2016-03-20 — End: 2016-03-20
  Filled 2016-03-20: qty 1

## 2016-03-20 MED ORDER — EPHEDRINE SULFATE 50 MG/ML IJ SOLN
INTRAMUSCULAR | Status: DC | PRN
  Administered 2016-03-20: 5 mg via INTRAVENOUS
  Administered 2016-03-20 (×3): 10 mg via INTRAVENOUS

## 2016-03-20 MED ORDER — LACTATED RINGERS IV SOLN: INTRAVENOUS | Status: DC

## 2016-03-20 MED ORDER — LIDOCAINE HCL (CARDIAC) 20 MG/ML IV SOLN
INTRAVENOUS | Status: DC | PRN
  Administered 2016-03-20: 100 mg via INTRAVENOUS

## 2016-03-20 MED ORDER — DEXTROSE-NACL 5-0.45 % IV SOLN
INTRAVENOUS | Status: DC
  Administered 2016-03-20 (×2): via INTRAVENOUS

## 2016-03-20 MED ORDER — LACTATED RINGERS IV SOLN
INTRAVENOUS | Status: DC | PRN
  Administered 2016-03-20 (×2): via INTRAVENOUS

## 2016-03-20 MED ORDER — SODIUM CHLORIDE 0.9 % IJ SOLN
INTRAMUSCULAR | Status: AC
  Filled 2016-03-20: qty 50

## 2016-03-20 MED ORDER — OXYCODONE HCL 5 MG PO TABS: 5.0000 mg | ORAL_TABLET | Freq: Once | ORAL | Status: DC | PRN

## 2016-03-20 MED ORDER — ONDANSETRON HCL 4 MG/2ML IJ SOLN
INTRAMUSCULAR | Status: AC
  Filled 2016-03-20: qty 2

## 2016-03-20 MED ORDER — HYDROMORPHONE HCL 1 MG/ML IJ SOLN
0.5000 mg | INTRAMUSCULAR | Status: DC | PRN
Start: 2016-03-20 — End: 2016-03-22

## 2016-03-20 MED ORDER — ROCURONIUM BROMIDE 100 MG/10ML IV SOLN
INTRAVENOUS | Status: DC | PRN
  Administered 2016-03-20: 20 mg via INTRAVENOUS
  Administered 2016-03-20 (×2): 10 mg via INTRAVENOUS
  Administered 2016-03-20: 40 mg via INTRAVENOUS

## 2016-03-20 MED ORDER — OXYCODONE HCL 5 MG/5ML PO SOLN: 5.0000 mg | Freq: Once | ORAL | Status: DC | PRN

## 2016-03-20 MED ORDER — LIDOCAINE 2% (20 MG/ML) 5 ML SYRINGE
INTRAMUSCULAR | Status: AC
  Filled 2016-03-20: qty 5

## 2016-03-20 MED ORDER — PHENYLEPHRINE HCL 10 MG/ML IJ SOLN
INTRAMUSCULAR | Status: AC
  Filled 2016-03-20: qty 1

## 2016-03-20 MED ORDER — PROMETHAZINE HCL 25 MG/ML IJ SOLN
12.5000 mg | Freq: Four times a day (QID) | INTRAMUSCULAR | Status: DC | PRN
  Administered 2016-03-20: 12.5 mg via INTRAVENOUS
  Filled 2016-03-20: qty 1

## 2016-03-20 MED ORDER — PROPOFOL 10 MG/ML IV BOLUS
INTRAVENOUS | Status: AC
  Filled 2016-03-20: qty 20

## 2016-03-20 MED ORDER — DIPHENHYDRAMINE HCL 50 MG/ML IJ SOLN: 12.5000 mg | Freq: Four times a day (QID) | INTRAMUSCULAR | Status: DC | PRN

## 2016-03-20 MED ORDER — LACTATED RINGERS IR SOLN
Status: DC | PRN
  Administered 2016-03-20: 50 mL

## 2016-03-20 MED ORDER — INFLUENZA VAC SPLIT QUAD 0.5 ML IM SUSY
0.5000 mL | PREFILLED_SYRINGE | INTRAMUSCULAR | Status: AC
Start: 2016-03-21 — End: 2016-03-21
  Administered 2016-03-21: 0.5 mL via INTRAMUSCULAR
  Filled 2016-03-20: qty 0.5

## 2016-03-20 SURGICAL SUPPLY — 67 items
APPLICATOR SURGIFLO ENDO (HEMOSTASIS) ×3 IMPLANT
BAG LAPAROSCOPIC 12 15 PORT 16 (BASKET) ×1 IMPLANT
BAG RETRIEVAL 12/15 (BASKET) ×2
BAG RETRIEVAL 12/15MM (BASKET) ×1
BNDG COHESIVE 1X5 TAN STRL LF (GAUZE/BANDAGES/DRESSINGS) ×3 IMPLANT
CHLORAPREP W/TINT 26ML (MISCELLANEOUS) ×3 IMPLANT
CLIP LIGATING HEM O LOK PURPLE (MISCELLANEOUS) ×3 IMPLANT
CLIP LIGATING HEMO LOK XL GOLD (MISCELLANEOUS) ×3 IMPLANT
CLIP LIGATING HEMO O LOK GREEN (MISCELLANEOUS) ×3 IMPLANT
COVER SURGICAL LIGHT HANDLE (MISCELLANEOUS) ×3 IMPLANT
COVER TIP SHEARS 8 DVNC (MISCELLANEOUS) ×2 IMPLANT
COVER TIP SHEARS 8MM DA VINCI (MISCELLANEOUS) ×4
CUTTER ECHEON FLEX ENDO 45 340 (ENDOMECHANICALS) ×3 IMPLANT
DECANTER SPIKE VIAL GLASS SM (MISCELLANEOUS) ×3 IMPLANT
DISSECT BALLN SPACEMKR + OVL (BALLOONS) ×3
DISSECTOR BALLN SPACEMKR + OVL (BALLOONS) ×1 IMPLANT
DRAIN CHANNEL 15F RND FF 3/16 (WOUND CARE) IMPLANT
DRAPE ARM DVNC X/XI (DISPOSABLE) ×4 IMPLANT
DRAPE COLUMN DVNC XI (DISPOSABLE) ×1 IMPLANT
DRAPE DA VINCI XI ARM (DISPOSABLE) ×8
DRAPE DA VINCI XI COLUMN (DISPOSABLE) ×2
DRAPE INCISE IOBAN 66X45 STRL (DRAPES) ×3 IMPLANT
DRAPE LAPAROSCOPIC ABDOMINAL (DRAPES) ×3 IMPLANT
DRAPE SHEET LG 3/4 BI-LAMINATE (DRAPES) ×3 IMPLANT
ELECT PENCIL ROCKER SW 15FT (MISCELLANEOUS) ×3 IMPLANT
ELECT REM PT RETURN 9FT ADLT (ELECTROSURGICAL) ×6
ELECTRODE REM PT RTRN 9FT ADLT (ELECTROSURGICAL) ×2 IMPLANT
EVACUATOR SILICONE 100CC (DRAIN) IMPLANT
GLOVE BIO SURGEON STRL SZ 6.5 (GLOVE) ×4 IMPLANT
GLOVE BIO SURGEON STRL SZ7 (GLOVE) ×6 IMPLANT
GLOVE BIO SURGEONS STRL SZ 6.5 (GLOVE) ×2
GLOVE BIOGEL M STRL SZ7.5 (GLOVE) ×9 IMPLANT
GOWN STRL REUS W/TWL LRG LVL3 (GOWN DISPOSABLE) ×21 IMPLANT
IRRIG SUCT STRYKERFLOW 2 WTIP (MISCELLANEOUS) ×3
IRRIGATION SUCT STRKRFLW 2 WTP (MISCELLANEOUS) ×1 IMPLANT
KIT BASIN OR (CUSTOM PROCEDURE TRAY) ×3 IMPLANT
LIQUID BAND (GAUZE/BANDAGES/DRESSINGS) ×3 IMPLANT
LOOP VESSEL MAXI BLUE (MISCELLANEOUS) IMPLANT
NEEDLE INSUFFLATION 14GA 120MM (NEEDLE) ×3 IMPLANT
PORT ACCESS TROCAR AIRSEAL 12 (TROCAR) ×1 IMPLANT
PORT ACCESS TROCAR AIRSEAL 5M (TROCAR) ×2
RELOAD STAPLER WHITE 60MM (STAPLE) IMPLANT
RELOAD WH ECHELON 45 (STAPLE) ×9 IMPLANT
SEAL CANN UNIV 5-8 DVNC XI (MISCELLANEOUS) ×4 IMPLANT
SEAL XI 5MM-8MM UNIVERSAL (MISCELLANEOUS) ×8
SET TRI-LUMEN FLTR TB AIRSEAL (TUBING) ×3 IMPLANT
SOLUTION ELECTROLUBE (MISCELLANEOUS) ×3 IMPLANT
SPONGE LAP 4X18 X RAY DECT (DISPOSABLE) ×3 IMPLANT
STAPLE ECHEON FLEX 60 POW ENDO (STAPLE) IMPLANT
STAPLER RELOAD WHITE 60MM (STAPLE)
SURGIFLO W/THROMBIN 8M KIT (HEMOSTASIS) ×3 IMPLANT
SUT ETHILON 3 0 PS 1 (SUTURE) IMPLANT
SUT MNCRL AB 4-0 PS2 18 (SUTURE) ×6 IMPLANT
SUT PDS AB 1 CT1 27 (SUTURE) ×9 IMPLANT
SUT VICRYL 0 UR6 27IN ABS (SUTURE) ×3 IMPLANT
TAPE STRIPS DRAPE STRL (GAUZE/BANDAGES/DRESSINGS) IMPLANT
TOWEL OR 17X26 10 PK STRL BLUE (TOWEL DISPOSABLE) ×3 IMPLANT
TOWEL OR NON WOVEN STRL DISP B (DISPOSABLE) ×3 IMPLANT
TRAY FOLEY BAG SILVER LF 14FR (CATHETERS) ×3 IMPLANT
TRAY FOLEY W/METER SILVER 16FR (SET/KITS/TRAYS/PACK) IMPLANT
TRAY LAPAROSCOPIC (CUSTOM PROCEDURE TRAY) ×3 IMPLANT
TROCAR BLADELESS OPT 12M 100M (ENDOMECHANICALS) IMPLANT
TROCAR BLADELESS OPT 5 100 (ENDOMECHANICALS) IMPLANT
TROCAR UNIVERSAL OPT 12M 100M (ENDOMECHANICALS) ×3 IMPLANT
TROCAR XCEL 12X100 BLDLESS (ENDOMECHANICALS) ×3 IMPLANT
WATER STERILE IRR 1000ML POUR (IV SOLUTION) ×3 IMPLANT
WATER STERILE IRR 1500ML POUR (IV SOLUTION) IMPLANT

## 2016-03-20 NOTE — Brief Op Note (Signed)
03/20/2016  11:34 AM  PATIENT:  Francesco Runner  75 y.o. female  PRE-OPERATIVE DIAGNOSIS:  LEFT ATROPHIC KIDNEY WITH MASS  POST-OPERATIVE DIAGNOSIS:  LEFT ATROPHIC KIDNEY WITH MASS  PROCEDURE:  Procedure(s): XI ROBOTIC ASSISTED LAPAROSCOPIC RETROPERITONEAL NEPHRECTOMY (Left)  SURGEON:  Surgeon(s) and Role:    * Alexis Frock, MD - Primary  PHYSICIAN ASSISTANT:   ASSISTANTS: Debbrah Alar, PA   ANESTHESIA:   local and general  EBL:  Total I/O In: 1000 [I.V.:1000] Out: 350 [Urine:300; Blood:50]  BLOOD ADMINISTERED:none  DRAINS: Foley to gravity   LOCAL MEDICATIONS USED:  MARCAINE     SPECIMEN:  Source of Specimen:  left radical nephrectomy   DISPOSITION OF SPECIMEN:  PATHOLOGY  COUNTS:  YES  TOURNIQUET:  * No tourniquets in log *  DICTATION: .Other Dictation: Dictation Number [id number]  PLAN OF CARE: Admit to inpatient   PATIENT DISPOSITION:  PACU - hemodynamically stable.   Delay start of Pharmacological VTE agent (>24hrs) due to surgical blood loss or risk of bleeding: yes

## 2016-03-20 NOTE — Anesthesia Preprocedure Evaluation (Signed)
Anesthesia Evaluation  Patient identified by MRN, date of birth, ID band Patient awake    Reviewed: Allergy & Precautions, NPO status , Patient's Chart, lab work & pertinent test results  History of Anesthesia Complications (+) PONV  Airway Mallampati: II   Neck ROM: full    Dental   Pulmonary shortness of breath, sleep apnea , former smoker,    breath sounds clear to auscultation       Cardiovascular hypertension, + Peripheral Vascular Disease  + dysrhythmias + pacemaker  Rhythm:regular Rate:Normal  Complete heart block.   Neuro/Psych  Headaches, PSYCHIATRIC DISORDERS Depression    GI/Hepatic hiatal hernia, PUD, GERD  ,  Endo/Other  Hypothyroidism   Renal/GU      Musculoskeletal   Abdominal   Peds  Hematology   Anesthesia Other Findings   Reproductive/Obstetrics                             Anesthesia Physical Anesthesia Plan  ASA: III  Anesthesia Plan: General   Post-op Pain Management:    Induction: Intravenous  Airway Management Planned: Oral ETT  Additional Equipment:   Intra-op Plan:   Post-operative Plan: Extubation in OR  Informed Consent: I have reviewed the patients History and Physical, chart, labs and discussed the procedure including the risks, benefits and alternatives for the proposed anesthesia with the patient or authorized representative who has indicated his/her understanding and acceptance.     Plan Discussed with: CRNA, Anesthesiologist and Surgeon  Anesthesia Plan Comments:         Anesthesia Quick Evaluation

## 2016-03-20 NOTE — Discharge Instructions (Signed)

## 2016-03-20 NOTE — H&P (Signed)
[name] is an 75 y.o. female.    Chief Complaint: Pre-op LEFT retroperitoneal robotic radical nephrectomy  HPI:   1 - Enlarging left renal mass in atrophic kidney - 1.3cm enhancing left upper pole mass by CT 2015. Slow size increase to 1.9cm with repeat imaging 2017. 1 artery / 1 vein left renovascular anatomy. No additional masses. Some small pulm nodules stable since 2013. She has large sheet of abdominal mesh from prior hernia repair.    After careful consideration of options including left retroperitoneal nephrectomy v. surveillance she has opted for left retroperitoneal nephrectomy.    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 Renogram 2017 after JJ stent with minimal function 7% left, 93% right), no ureteral masses by retrograde. Cr 1.3's GFR 50s.   3 - Nephrolithiasis - h/o stone passage over 50 years ago. No additional stones. CT 2017 stone free.   PMH sig for rupture diverticulitis / colostomy / colostomy reversal abd hernia repair with mesh (large sheet of mesh and anchors noted by CT 2017), Mild CHF / MR (follows Dr. 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 Her PCP is Daisy Koyanagi MD.    Today "[name]" is seen to proceed with left retroperitoneal radical nephrectomy. No interval fevers.    Past Medical History:  Diagnosis Date  . Atrioventricular block, complete ([place])    a. 08/2011 Upgrage of PPM to MDT [place] L Dual Chamber PPM ser # [id number] H.  . Cardiac pacemaker in situ   . Contact dermatitis and eczema    unspec cause  . Dairy product intolerance   . Depression   . Diastolic dysfunction   . GERD (gastroesophageal reflux disease)   . Hiatal hernia   . History of CHF (congestive heart failure)   . History of diverticulitis of colon    2003  perforated diverticulitis w/ surgical intervention  . History of kidney stones     age 55  . History of peripheral edema    lower extremities  . History of thyroid nodule    multinodular goiter  s/p  total thyroidectomy 2014  . HTN (hypertension)   . Hydronephrosis, left   . Hypothyroidism   . Mild sleep apnea    per study 04-14-2014  . Nocturia   . Osteoporosis   . PAF (paroxysmal atrial fibrillation) ([place])   . PONV (postoperative nausea and vomiting)   . Pulmonary nodules    stable per last ct  . Renal cell carcinoma of left kidney ([facility])   . Shortness of breath    intermittant  . Superficial thrombophlebitis     Past Surgical History:  Procedure Laterality Date  . CARDIOVASCULAR STRESS TEST  04-20-2015  dr Caryl Comes   normal nuclear study/  no ischemia/  normal LV function and wall motion, ef 74%  . COLONOSCOPY  01/19/2013  . COLOSTOMY TAKEDOWN  07-22-2002  . CYSTOSCOPY WITH RETROGRADE PYELOGRAM, URETEROSCOPY AND STENT PLACEMENT Left 07/28/2015   Procedure: CYSTOSCOPY WITH LEFT RETROGRADE PYELOGRAM, POSSIBLE LEFT URETEROSCOPY AND STENT PLACEMENT;  Surgeon: Franchot Gallo, MD;  Location: [facility];  Service: Urology;  Laterality: Left;  . EXPLORATORY LAPARTOMY SIGMOID COLECTOMY/ COLOSTOMY  04-28-2002   perforated diverticulitis  . LAPAROSCOPY VENTRAL HERNIA REPAIR/  EXTENSIVE LYSIS ADHESIONS  05-04-2004  . PACEMAKER GENERATOR CHANGE Left 09/16/2011   MDT [place] pacemaker  . PACEMAKER PLACEMENT  05-11-2003  medtronic  . THYROIDECTOMY N/A 08/07/2012   Procedure: THYROIDECTOMY;  Surgeon: Earnstine Regal, MD;  Location: [facility];  Service: General;  Laterality: N/A;  . TRANSTHORACIC ECHOCARDIOGRAM  10-12-2014   moderate focal basal LVH,  ef 55-60%,  grade 2 diastolic dysfunction, mild AV calcification without stenosis,  mild MR,  trivial PR    Family History  Problem Relation Age of Onset  . Heart disease Mother   . Stroke Mother   . Hypertension Mother   . Heart failure Father   . Hypertension Brother   . Heart disease Brother   .  Diverticulosis Sister   . Hypertension Brother   . Cancer Son     breast  . Heart attack Neg Hx    Social History:  reports that she quit smoking about 36 years ago. Her smoking use included Cigarettes. She quit after 15.00 years of use. She has never used smokeless tobacco. She reports that she does not drink alcohol or use drugs.  Allergies:  Allergies  Allergen Reactions  . Tape Rash  . Codeine Nausea And Vomiting    No prescriptions prior to admission.    No results found for this or any previous visit (from the past 48 hour(s)). No results found.  Review of Systems  Constitutional: Negative.  Negative for chills and fever.  HENT: Negative.   Eyes: Negative.   Respiratory: Negative.   Cardiovascular: Negative.   Gastrointestinal: Negative.  Negative for nausea and vomiting.  Genitourinary: Negative.  Negative for hematuria.  Musculoskeletal: Negative.   Skin: Negative.   Neurological: Negative.   Endo/Heme/Allergies: Negative.   Psychiatric/Behavioral: Negative.     There were no vitals taken for this visit. Physical Exam  Constitutional: She appears well-developed.  HENT:  Head: Normocephalic.  Eyes: Pupils are equal, round, and reactive to light.  Cardiovascular: Normal rate.   Respiratory: Effort normal.  GI: Soft.  Multiple scars w/o hernias.   Genitourinary:  Genitourinary Comments: NO CVAT.   Musculoskeletal: Normal range of motion.  Neurological: She is alert.  Skin: Skin is warm.  Psychiatric: She has a normal mood and affect. Her behavior is normal. Judgment and thought content normal.     Assessment/Plan  Proceed as planned with left radical nephrectomy with retroperitoneal technique. Risks, benefits, alternatives, expected peri-op course discussed previously and reiterated today.   Alexis Frock, MD 03/20/2016, 1:14 AM

## 2016-03-20 NOTE — Anesthesia Procedure Notes (Signed)
Procedure Name: Intubation Date/Time: 03/20/2016 8:42 AM Performed by: Maxwell Caul Pre-anesthesia Checklist: Patient identified, Emergency Drugs available, Suction available and Patient being monitored Patient Re-evaluated:Patient Re-evaluated prior to inductionOxygen Delivery Method: Circle system utilized Preoxygenation: Pre-oxygenation with 100% oxygen Intubation Type: IV induction Ventilation: Mask ventilation without difficulty Laryngoscope Size: Mac and 4 Grade View: Grade II Tube type: Oral Tube size: 7.0 mm Number of attempts: 1 Airway Equipment and Method: Stylet and Oral airway Placement Confirmation: ETT inserted through vocal cords under direct vision,  positive ETCO2 and breath sounds checked- equal and bilateral Secured at: 21 cm Tube secured with: Tape Dental Injury: Teeth and Oropharynx as per pre-operative assessment

## 2016-03-20 NOTE — Transfer of Care (Signed)
Immediate Anesthesia Transfer of Care Note  Patient: [name]  Procedure(s) Performed: Procedure(s): XI ROBOTIC ASSISTED LAPAROSCOPIC RETROPERITONEAL NEPHRECTOMY (Left)  Patient Location: PACU  Anesthesia Type:General  Level of Consciousness:  sedated, patient cooperative and responds to stimulation  Airway & Oxygen Therapy:Patient Spontanous Breathing and Patient connected to face mask oxgen  Post-op Assessment:  Report given to PACU RN and Post -op Vital signs reviewed and stable  Post vital signs:  Reviewed and stable  Last Vitals:  Vitals:   03/20/16 0706  BP: (!) 146/75  Pulse: 69  Resp: 18  Temp: [id number] C    Complications: No apparent anesthesia complications

## 2016-03-20 NOTE — Significant Event (Signed)
Rapid Response Event Note  Overview: Time Called: 2134 Arrival Time: 2136 Event Type: Neurologic  Initial Focused Assessment: Was called to patient room in 1409. RN was concerned that patient was drowsy and speech was slurred. On arrival to patient room, patient was in bed with her eyes closed. Patient was drowsy, but oriented to person, place, time and situation.Her pupils were equal and reactive to light. No facial asymmetry noted, smile was symmetric. Patient unable to actively open her left eyelid (Per RN,this was how she presents after her surgery). Grips was strong bilaterally, she was also able to move her extremeties. Lungs and chest clear bilaterally. BP 146/81, HR 90, RR 12, O2 sats 100% on 2L [place]. Of note patient was given 12.5mg  of Phenergan intravenously for nausea at 2102.   Interventions:  Warm blankets were placed on patient for a rectal Temp of 95.4.  Plan of Care (if not transferred): Patient RN told to call Rapid Response if patient condition worsens or fail to improve by 2300. Urologist on call aware of patient condition. Event Summary: event ended at 2200   at      at          [facility], [facility]

## 2016-03-20 NOTE — Anesthesia Postprocedure Evaluation (Signed)
Anesthesia Post Note  Patient: [name]  Procedure(s) Performed: Procedure(s) (LRB): XI ROBOTIC ASSISTED LAPAROSCOPIC RETROPERITONEAL NEPHRECTOMY (Left)  Patient location during evaluation: PACU Anesthesia Type: General Level of consciousness: awake and alert and patient cooperative Pain management: pain level controlled Vital Signs Assessment: post-procedure vital signs reviewed and stable Respiratory status: spontaneous breathing and respiratory function stable Cardiovascular status: stable Anesthetic complications: no Comments: There is some crepitus along the left neck area.  It is not significant enough to affect the airway.  Dr Tresa Moore aware.    Last Vitals:  Vitals:   03/20/16 1220 03/20/16 1230  BP:  122/61  Pulse: 91 89  Resp: (!) 5 17  Temp:  36.4 C    Last Pain:  Vitals:   03/20/16 1230  TempSrc:   PainSc: [place]

## 2016-03-21 LAB — BASIC METABOLIC PANEL
ANION GAP: 9 (ref 5–15)
BUN: 11 mg/dL (ref 6–20)
CHLORIDE: 96 mmol/L — AB (ref 101–111)
CO2: 27 mmol/L (ref 22–32)
Calcium: 9.3 mg/dL (ref 8.9–10.3)
Creatinine, Ser: 1.01 mg/dL — ABNORMAL HIGH (ref 0.44–1.00)
GFR calc Af Amer: >60 mL/min (ref >60)
GFR, EST NON AFRICAN AMERICAN: 52 mL/min — AB (ref >60)
Glucose, Bld: 141 mg/dL — ABNORMAL HIGH (ref 65–99)
POTASSIUM: 3.8 mmol/L (ref 3.5–5.1)
SODIUM: 132 mmol/L — AB (ref 135–145)

## 2016-03-21 LAB — HEMOGLOBIN AND HEMATOCRIT, BLOOD
HCT: 35.7 % — ABNORMAL LOW (ref 36.0–46.0)
HEMOGLOBIN: 11.8 g/dL — AB (ref 12.0–15.0)

## 2016-03-21 MED ORDER — DOCUSATE SODIUM 100 MG PO CAPS
100.0000 mg | ORAL_CAPSULE | Freq: Four times a day (QID) | ORAL | Status: DC | PRN
  Administered 2016-03-21 – 2016-03-22 (×2): 100 mg via ORAL
  Filled 2016-03-21 (×2): qty 1

## 2016-03-21 MED ORDER — BISACODYL 10 MG RE SUPP
10.0000 mg | Freq: Every day | RECTAL | Status: DC | PRN
  Administered 2016-03-22: 10 mg via RECTAL
  Filled 2016-03-21: qty 1

## 2016-03-21 MED ORDER — ACETAMINOPHEN 325 MG PO TABS
650.0000 mg | ORAL_TABLET | Freq: Four times a day (QID) | ORAL | Status: DC | PRN
  Administered 2016-03-21: 650 mg via ORAL
  Filled 2016-03-21: qty 2

## 2016-03-21 MED ORDER — ONDANSETRON HCL 4 MG PO TABS: 4.0000 mg | ORAL_TABLET | Freq: Three times a day (TID) | ORAL | Status: DC | PRN

## 2016-03-21 NOTE — Op Note (Signed)
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  PROCEDURE:  Robotic-assisted laparoscopic left retroperitoneal radical nephrectomy.  ASSISTANT:  Debbrah Alar, PA.  ESTIMATED BLOOD LOSS:  50 mL.  COMPLICATION:  None.  SPECIMEN:  Left radical nephrectomy.  FINDINGS: 1. Single artery, single vein, left renovascular anatomy. 2. Solid-appearing upper pole mass as anticipated. 3. Atrophic left kidney.  DRAINS:  Foley catheter to straight drain.  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  She has had a small renal mass that has been on surveillance for several years.  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  She had recent nuclear medicine renogram, which corroborated minimal function of the left kidney, had only 7% relative to her right. Her overall kidney function is normal.  Options were discussed including continued surveillance versus surgical therapy with radical nephrectomy being the preferred approach given the minimal function of the left kidney and she adamantly wished to proceed.  She has extensive surgical history including prior colon resection, temporary colostomy, three anastomosis as well as a ventral hernia repair with a very large piece of mesh in her anterior abdominal wall.  Given this, it was felt that a retroperitoneal approach would be most advantageous to avoid her peritoneal cavity.  Informed consent was obtained and placed in the medical record.  PROCEDURE IN DETAIL:  The patient being, [name], was verified. Procedure  being left retroperitoneal radical nephrectomy was confirmed. Procedure was carried out.  Time-out was performed.  Intravenous antibiotics were administered.  General endotracheal anesthesia was introduced.  The patient was placed into a left side up full flank position, applying 15 degrees of stable flexion, superior arm elevator, axillary roll, sequential compression devices, bottom leg bent, top leg straight, after Foley catheter was placed per urethra to straight drain, it was further fashioned to the operative table using 3-inch tape over foam padding across her supraxiphoid chest and her pelvis.  She is quite thin and her bony prominences were very carefully padded with a foam padding.  Sterile field was created by prepping and draping the patient's entire left flank using chlorhexidine gluconate.  Next, the 12th rib tip psoas and iliac crest were carefully defined and incision was made approximately 1.5 cm in length and it positioned approximately 2 fingerbreadths inferomedially to the tip of the 12th rib.  Dissection was carried down through the subcutaneous fat to the level of the lumbodorsal fascia, which was carefully pierced with blunt tip hemostats and continued digital dissection was performed into the retroperitoneal space.  The psoas musculature was felt as was the lower pole of the kidney and the iliacus muscles corroborating entrance into the retroperitoneal space.  The retroperitoneal space was further developed using surgeon's finger in this same  area.  Next, the coil-shaped pacemaker balloon apparatus was carefully advanced through the same incision under laparoscopic vision.  This was carefully and slowly inflated to 28 pumps with continued visualization of all quadrants, this resulted in excellent formation of the retroperitoneal space.  The psoas musculature and aorta were easily visualized as was the lower pole of the kidney during these maneuvers.  Next, robotic  ports were placed. First, a subcostal 12th rib medial port just lateral to the psoas musculature, a mid lateral 8-mm robotic port approximately 2 fingerbreadths lateral to the prior site of entrance and a far lateral port 3 fingerbreadths beyond this, taking exquisite care to avoid any injury to the peritoneal cavity, which did not occur as it had been swept away from this area.  A 12-mm AirSeal port was placed approximately 2 fingerbreadths below the level of the initial incision site directly above the surface of the psoas muscle and the initial incision site.  A pacemaker-type access port was then placed with 20 mL of air in the balloon, through which, 8-mm robotic camera port was placed.  Pneumoperitoneum was achieved.  Robot was docked and passed through the electronic checks.  Initial attention was directed to the development for added identification of the ureter and hilum.  Ureter was seen coursing along with the gonadal vein inferomedial to the lower pole of the kidney.  This was placed on gentle superior traction. Dissection proceeded within this triangle towards the area of the renal hilum following the gonadal vein.  Renal hilum consisted of single artery, single vein, left renovascular anatomy as anticipated.  The artery was controlled using extra-large clip proximal followed by vascular load stapler distal and the vein was controlled using vascular load stapler.  Notably, the surgical assistant, Debbrah Alar, was crucial in all aspects of the hilar control, operating at the bedside and allowing clipping and stapling the vital structures as well as suctioning and retraction during the remainder of the surgery.  Next, the posterior plane was carefully developed sweeping the posterior kidney away from the psoas musculature.  Next, the posterior plane was developed, the anterior plane was developed freeing the kidney up from the posterior surface of the peritoneum.  Superior  attachments were carefully taken down with placement of the kidney in gentle inferolateral traction.  Partial adrenal sparing was performed in effort to get around the known upper pole mass, which we were able to keep intact visibly.  Following these maneuvers, hemostasis appeared excellent.  All sponge and needle counts were correct.  The left nephrectomy specimen was completely freed up.  A small amount of Surgiflo was applied to the area of the partial adrenal resection bed, resulted in excellent hemostasis.  The specimen was placed into an EndoCatch bag.  Robot was then undocked.  Specimen was retrieved by connecting the prior camera port and assistant port sites, removing and setting aside from permanent pathology.  This site was closed at the level of the fascia using figure-of-eight PDS x3 followed by reapproximation of subcutaneous tissue using running Vicryl.  All incision sites were infiltrated with dilute lyophilized Marcaine and closed at the level of the skin using subcuticular Monocryl followed by Dermabond.  Procedure was then terminated.  The patient tolerated the procedure well, no immediate periprocedural complications.  The patient was taken to the postanesthesia care unit in stable condition.          ______________________________ Alexis Frock, MD     TM/MEDQ  D:  03/20/2016  T:  03/21/2016  Job:  [id number]

## 2016-03-21 NOTE — Progress Notes (Signed)
UROLOGY PROGRESS NOTES  Assessment/Plan: [name] is a 75 y.o. female s/p left robotic retroperitoneal radical nephrectomy on 03/20/16 with Dr. Tresa Moore.   Interval/Plan: Hypothermic last evening. Rest of vitals stable. 1.5L UOP via Foley overnight. Labs stable. Left sided sub-q emphysema of upper extremity and left sided facial edema. Nausea with emesis x1 yesterday. Sedated with dilaudid. OOB x1. Medlock, regular diet, d/c Foley and TOV, OOB, wean oxygen today.  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   Vital signs in last 24 hours: Temp:  [95.4 F (35.2 C)-98.1 F (36.7 C)] 98.1 F (36.7 C) (09/21 0408) Pulse Rate:  [62-101] 81 (09/21 0408) Resp:  [5-22] 13 (09/21 0408) BP: (122-149)/(61-81) 146/65 (09/21 0408) SpO2:  [92 %-100 %] 100 % (09/21 0408) Weight:  [139 lb 3.2 oz (63.1 kg)] 139 lb 3.2 oz (63.1 kg) (09/20 0723)  09/20 0701 - 09/21 0700 In: 2448.3 [P.O.:240; I.V.:2208.3] Out: 2100 [Urine:2050; Blood:50]    Physical Exam:  General:  well-developed and well-nourished  in NAD, lying in bed, alert & oriented, pleasant HEENT: [place]/AT, EOMI, sclera anicteric, hearing grossly intact, no nasal discharge, MMM. Some Lef sided facial subcutnaoeus epmhysema from priro surgery, improgin.  Respiratory: nonlabored respirations, satting well on RA, symmetrical chest rise Cardiovascular: pulse regular rate & rhythm Abdominal: soft, NTTP, nondistended, surgical incisions c/d/i without signs of exudate/erythema GU: recnet left flank incisions c/d/i. No hematomas / drainge.  Extremities: warm, well-perfused, no c/c/e Neuro: no focal deficits   Data Review: Results for orders placed or performed during the hospital encounter of 03/20/16 (from the past 24 hour(s))  Hemoglobin and hematocrit, blood     Status: Abnormal   Collection Time: 03/20/16 12:13 PM  Result Value Ref Range   Hemoglobin 11.9 (L) 12.0 - 15.0 g/dL   HCT 36.7 36.0 - [id number] %  Basic metabolic panel      Status: Abnormal   Collection Time: 03/21/16  5:56 AM  Result Value Ref Range   Sodium 132 (L) 135 - 145 mmol/L   Potassium 3.8 3.5 - 5.1 mmol/L   Chloride 96 (L) 101 - 111 mmol/L   CO2 27 22 - 32 mmol/L   Glucose, Bld 141 (H) 65 - 99 mg/dL   BUN 11 6 - 20 mg/dL   Creatinine, Ser 1.01 (H) 0.44 - 1.00 mg/dL   Calcium 9.3 8.9 - 10.3 mg/dL   GFR calc non Af Amer 52 (L) >60 mL/min   GFR calc Af Amer >60 >60 mL/min   Anion gap 9 5 - 15  Hemoglobin and hematocrit, blood     Status: Abnormal   Collection Time: 03/21/16  5:56 AM  Result Value Ref Range   Hemoglobin 11.8 (L) 12.0 - 15.0 g/dL   HCT 35.7 (L) 36.0 - 46.0 %

## 2016-03-21 NOTE — Consult Note (Signed)
   [facility] Inpatient Consult   03/21/2016  ARRIONNA MEDDAUGH 03-Feb-1941 [id number]   Patient screened for [facility] services due to history of CHF. Hospital admission is for renal surgery. She has had 1 hospital admission in past 6 months. Discussed with inpatient RNCM. No [facility] Care Management needs assessed at this time.    Marthenia Rolling, MSN-Ed, RN,BSN [facility] Liaison [phone]

## 2016-03-21 NOTE — Care Management Note (Signed)
Case Management Note  Patient Details  Name: [name] MRN: [id number] Date of Birth: 1941/03/28  Subjective/Objective: 75 y/o f admitted w/Renal mass. S/p Lap robotic radical nephrectomy.  From home.                 Action/Plan:d/c plan home.   Expected Discharge Date:                  Expected Discharge Plan:  Home/Self Care  In-House Referral:     Discharge planning Services  CM Consult  Post Acute Care Choice:    Choice offered to:     DME Arranged:    DME Agency:     HH Arranged:    HH Agency:     Status of Service:  In process, will continue to follow  If discussed at Long Length of Stay Meetings, dates discussed:    Additional Comments:  [name], RN 03/21/2016, 11:47 AM

## 2016-03-22 LAB — CBC
HCT: 33.7 % — ABNORMAL LOW (ref 36.0–46.0)
Hemoglobin: 11.3 g/dL — ABNORMAL LOW (ref 12.0–15.0)
MCH: 28.1 pg (ref 26.0–34.0)
MCHC: 33.5 g/dL (ref 30.0–36.0)
MCV: 83.8 fL (ref 78.0–100.0)
PLATELETS: 133 10*3/uL — AB (ref 150–400)
RBC: 4.02 MIL/uL (ref 3.87–5.11)
RDW: 12.8 % (ref 11.5–15.5)
WBC: 7.7 10*3/uL (ref 4.0–10.5)

## 2016-03-22 LAB — BASIC METABOLIC PANEL
Anion gap: 7 (ref 5–15)
BUN: 17 mg/dL (ref 6–20)
CALCIUM: 8.9 mg/dL (ref 8.9–10.3)
CO2: 27 mmol/L (ref 22–32)
CREATININE: 1.1 mg/dL — AB (ref 0.44–1.00)
Chloride: 100 mmol/L — ABNORMAL LOW (ref 101–111)
GFR calc non Af Amer: 47 mL/min — ABNORMAL LOW (ref >60)
GFR, EST AFRICAN AMERICAN: 55 mL/min — AB (ref >60)
Glucose, Bld: 111 mg/dL — ABNORMAL HIGH (ref 65–99)
Potassium: 3.8 mmol/L (ref 3.5–5.1)
SODIUM: 134 mmol/L — AB (ref 135–145)

## 2016-03-22 MED ORDER — SENNOSIDES-DOCUSATE SODIUM 8.6-50 MG PO TABS: 1.0000 | ORAL_TABLET | Freq: Two times a day (BID) | ORAL | 0 refills | Status: DC

## 2016-03-22 NOTE — Care Management Note (Signed)
Case Management Note  Patient Details  Name: [name] MRN: [id number] Date of Birth: 18-Aug-1940  Subjective/Objective:                    Action/Plan:d/c home no needs or orders.   Expected Discharge Date:                  Expected Discharge Plan:  Home/Self Care  In-House Referral:     Discharge planning Services  CM Consult  Post Acute Care Choice:    Choice offered to:     DME Arranged:    DME Agency:     HH Arranged:    [place] Agency:     Status of Service:  Completed, signed off  If discussed at [employer] of Stay Meetings, dates discussed:    Additional Comments:  [name], RN 03/22/2016, 12:03 PM

## 2016-03-22 NOTE — Progress Notes (Signed)
D/C instructions reviewed w/ pt and dtr. Both verbalize understanding and all questions answered. Pt ambulated in hall, tolerated small amts of solid food w/ no nausea/vomiting. Pain controlled on oxycodone. Pt d/c in stable condition in w/c to dtr's car by NT. Pt in possession of d/c instructions, scripts, and all personal belongings.

## 2016-03-22 NOTE — Discharge Summary (Signed)
Physician Discharge Summary  Patient ID: [name] MRN: [id number] DOB/AGE: 77-Jan-1942 77 y.o.  Admit date: 03/20/2016 Discharge date: 03/22/2016  Admission Diagnoses: Left Renal Mass in Atrophic Kidney  Discharge Diagnoses:  Active Problems:   Renal mass   Discharged Condition: good  Hospital Course:   Left Renal Mass in Atrophic Kidney - pt underwent LEFT robotic retroperiotneal radical nephrectomy on 9/20, the day of admission, withotu acute complicaitons. Admitted to the 4th floor Urolgoy service post-op. Foely removed POD 1, Hgb >11, Cr <1.5. By the mid morning of POD2, the day of discharge, she is ambulatory, pain controlled on PO meds, maintaining oral hydration, and felt to be adequate for discahrge. 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   Consults: None  Significant Diagnostic Studies: labs: as per above  Treatments: surgery:  LEFT robotic retroperiotneal radical nephrectomy on 03/20/16  Discharge Exam: Blood pressure 131/69, pulse 82, temperature 98.4 F (36.9 C), temperature source Oral, resp. rate 18, height 5\' 4"  (1.626 m), weight 63.1 kg (139 lb 3.2 oz), SpO2 95 %. General appearance: alert, cooperative and appears stated age Eyes: decreased left peri-orbital / facial sub Q emphyeseam from procedure. Denies visual defects.  Nose: Nares normal. Septum midline. Mucosa normal. No drainage or sinus tenderness. Throat: lips, mucosa, and tongue normal; teeth and gums normal Neck: supple, symmetrical, trachea midline Back: symmetric, no curvature. ROM normal. No CVA tenderness. Resp: non-labored on room air Cardio: Nl rate GI: soft, non-tender; bowel sounds normal; no masses,  no organomegaly and multiple anterior scars w/o herias. No rebound / guarding.  Extremities: extremities normal, atraumatic, no cyanosis or edema Pulses: 2+ and symmetric Lymph nodes: Cervical, supraclavicular, and axillary nodes normal. Neurologic:  Grossly normal Incision/Wound: Recent left flank incision sites c/d/i. Mild sking bruising but no hematomsas / hernias. Resolving sub Q emphyesema as expected.   Disposition: 01-Home or Self Care    Follow-up Information    Alexis Frock, MD On 04/08/2016.   Specialty:  Urology Why:  at 12:30 PM for MD visit. Dr. Tresa Moore will call you with pathology results when available.  Contact information: [address] [place] [address] [phone]           Signed: Alexis Frock 03/22/2016, 7:26 AM

## 2016-03-22 NOTE — Progress Notes (Signed)
UROLOGY PROGRESS NOTES  Assessment/Plan: [name] is a 75 y.o. female s/p left robotic retroperitoneal radical nephrectomy on 03/20/16 with Dr. Tresa Moore with final pathology showing oncocytoma.  Interval: AFVSS. 2.2L UOP +3x unmeasured s/p Foley removal and successful TOV. Cr stable at 1.1. Hb stable 11-12.   - D/c IV pain medications - Regular diet, OOB, IS - Discharge home later today  Subjective:  Feels somewhat "lousy" this morning, but better overall than yesterday. + OOB multiple times yesterday. Tolerating regular diet. Pain 5/10 in left flank and abdomen currently but being helped by PO PRNs. No n/v. Left sided postop edema and subcutaneous emphysema continues to improve.   Vital signs in last 24 hours: Temp:  [98.1 F (36.7 C)-98.5 F (36.9 C)] 98.4 F (36.9 C) (09/22 0530) Pulse Rate:  [79-92] 82 (09/22 0530) Resp:  [16-18] 18 (09/22 0530) BP: (131-137)/(66-69) 131/69 (09/22 0530) SpO2:  [94 %-95 %] 95 % (09/22 0530)  09/21 0701 - 09/22 0700 In: 480 [P.O.:480] Out: 2200 [Urine:2200]    Physical Exam:  General:  well-developed and well-nourished female in NAD, lying in bed, alert & oriented, pleasant HEENT: [place]/AT, EOMI, sclera anicteric, hearing grossly intact, no nasal discharge, MMM. Some Lef sided facial subcutnaoeus epmhysema from surgery, improving Respiratory: nonlabored respirations, satting well on RA, symmetrical chest rise Cardiovascular: pulse regular rate & rhythm Abdominal: soft, TTP in left flank, nondistended, surgical incisions c/d/i without signs of exudate/erythema GU: recent left flank incisions c/d/i. No hematomas / drainage.  Extremities: warm, well-perfused, no c/c/e Neuro: no focal deficits   Data Review: Results for orders placed or performed during the hospital encounter of 03/20/16 (from the past 24 hour(s))  Basic metabolic panel     Status: Abnormal   Collection Time: 03/22/16  5:00 AM  Result Value Ref Range   Sodium 134 (L) 135  - 145 mmol/L   Potassium 3.8 3.5 - 5.1 mmol/L   Chloride 100 (L) 101 - 111 mmol/L   CO2 27 22 - 32 mmol/L   Glucose, Bld 111 (H) 65 - 99 mg/dL   BUN 17 6 - 20 mg/dL   Creatinine, Ser 1.10 (H) 0.44 - 1.00 mg/dL   Calcium 8.9 8.9 - 10.3 mg/dL   GFR calc non Af Amer 47 (L) >60 mL/min   GFR calc Af Amer 55 (L) >60 mL/min   Anion gap 7 5 - 15  CBC     Status: Abnormal   Collection Time: 03/22/16  5:00 AM  Result Value Ref Range   WBC 7.7 4.0 - 10.5 K/uL   RBC 4.02 3.87 - 5.11 MIL/uL   Hemoglobin 11.3 (L) 12.0 - 15.0 g/dL   HCT 33.7 (L) 36.0 - 46.0 %   MCV 83.8 78.0 - 100.0 fL   MCH 28.1 26.0 - 34.0 pg   MCHC 33.5 30.0 - 36.0 g/dL   RDW 12.8 11.5 - 15.5 %   Platelets 133 (L) 150 - 400 K/uL

## 2016-03-25 ENCOUNTER — Encounter: Payer: Self-pay | Admitting: Internal Medicine

## 2016-05-30 ENCOUNTER — Telehealth: Payer: Self-pay | Admitting: Family Medicine

## 2016-05-30 NOTE — Telephone Encounter (Signed)
Patient is coming in on 06/03/16 for her CPE she would also like to get her Prolia injection at this time. Please advise.

## 2016-06-03 ENCOUNTER — Encounter: Payer: Self-pay | Admitting: Family Medicine

## 2016-06-03 ENCOUNTER — Ambulatory Visit (INDEPENDENT_AMBULATORY_CARE_PROVIDER_SITE_OTHER): Payer: Medicare Other | Admitting: Family Medicine

## 2016-06-03 VITALS — BP 138/80 | HR 69 | Temp 97.5°F | Resp 16 | Ht 64.0 in | Wt 133.4 lb

## 2016-06-03 DIAGNOSIS — K219 Gastro-esophageal reflux disease without esophagitis: Secondary | ICD-10-CM

## 2016-06-03 DIAGNOSIS — M545 Low back pain: Secondary | ICD-10-CM

## 2016-06-03 DIAGNOSIS — G8929 Other chronic pain: Secondary | ICD-10-CM

## 2016-06-03 DIAGNOSIS — Z0000 Encounter for general adult medical examination without abnormal findings: Secondary | ICD-10-CM | POA: Diagnosis not present

## 2016-06-03 DIAGNOSIS — E039 Hypothyroidism, unspecified: Secondary | ICD-10-CM

## 2016-06-03 DIAGNOSIS — I10 Essential (primary) hypertension: Secondary | ICD-10-CM | POA: Diagnosis not present

## 2016-06-03 MED ORDER — TRAMADOL HCL 50 MG PO TABS: 50.0000 mg | ORAL_TABLET | Freq: Three times a day (TID) | ORAL | 0 refills | Status: DC | PRN

## 2016-06-03 NOTE — Progress Notes (Signed)
Subjective:   [name] is a 74 y.o. female who presents for Medicare Annual (Subsequent) preventive examination.  Review of Systems:  Review of Systems  Constitutional: Negative for activity change, appetite change and fatigue.  HENT: Negative for hearing loss, congestion, tinnitus and ear discharge.  dentist q17m Eyes: Negative for visual disturbance (see optho q1y -- vision corrected to 20/20 with glasses).  Respiratory: Negative for cough, chest tightness and shortness of breath.   Cardiovascular: Negative for chest pain, palpitations and leg swelling.  Gastrointestinal: Negative for abdominal pain, diarrhea, constipation and abdominal distention.  Genitourinary: Negative for urgency, frequency, decreased urine volume and difficulty urinating.  Musculoskeletal: Negative for back pain, arthralgias and gait problem.  Skin: Negative for color change, pallor and rash.  Neurological: Negative for dizziness, light-headedness, numbness and headaches.  Hematological: Negative for adenopathy. Does not bruise/bleed easily.  Psychiatric/Behavioral: Negative for suicidal ideas, confusion, sleep disturbance, self-injury, dysphoric mood, decreased concentration and agitation.            Objective:     Vitals: BP 138/80 (BP Location: Right Arm, Patient Position: Sitting, Cuff Size: Normal)   Pulse 69   Temp 97.5 F (36.4 C) (Oral)   Resp 16   Ht 5\' 4"  (1.626 m)   Wt 133 lb 6.4 oz (60.5 kg)   SpO2 96%   BMI 22.90 kg/m   Body mass index is 22.9 kg/m.  BP 138/80 (BP Location: Right Arm, Patient Position: Sitting, Cuff Size: Normal)   Pulse 69   Temp 97.5 F (36.4 C) (Oral)   Resp 16   Ht 5\' 4"  (1.626 m)   Wt 133 lb 6.4 oz (60.5 kg)   SpO2 96%   BMI 22.90 kg/m  General appearance: alert, cooperative and appears stated age Head: Normocephalic, without obvious abnormality, atraumatic Eyes: negative findings: lids and lashes normal, conjunctivae and sclerae normal and pupils  equal, round, reactive to light and accomodation Ears: normal TM's and external ear canals both ears Nose: Nares normal. Septum midline. Mucosa normal. No drainage or sinus tenderness. Throat: lips, mucosa, and tongue normal; teeth and gums normal Neck: no adenopathy, no carotid bruit, no JVD, supple, symmetrical, trachea midline and thyroid not enlarged, symmetric, no tenderness/mass/nodules Back: symmetric, no curvature. ROM normal. No CVA tenderness. Lungs: clear to auscultation bilaterally Breasts: normal appearance, no masses or tenderness Heart: S1, S2 normal Abdomen: soft, non-tender; bowel sounds normal; no masses,  no organomegaly Pelvic: deferred Extremities: extremities normal, atraumatic, no cyanosis or edema Pulses: 2+ and symmetric Skin: Skin color, texture, turgor normal. No rashes or lesions Lymph nodes: Cervical, supraclavicular, and axillary nodes normal. Neurologic: Alert and oriented X 3, normal strength and tone. Normal symmetric reflexes. Normal coordination and gait   Tobacco History  Smoking Status  . Former Smoker  . Years: 15.00  . Types: Cigarettes  . Quit date: 10/06/1979  Smokeless Tobacco  . Never Used     Counseling given: Not Answered   Past Medical History:  Diagnosis Date  . Atrioventricular block, complete ([place])    a. 08/2011 Upgrage of PPM to MDT Adapta L Dual Chamber PPM ser # [id number].  . Cardiac pacemaker in situ   . Contact dermatitis and eczema    unspec cause  . Dairy product intolerance   . Depression   . Diastolic dysfunction   . GERD (gastroesophageal reflux disease)   . Hiatal hernia   . History of CHF (congestive heart failure)   . History of diverticulitis of colon  2003  perforated diverticulitis w/ surgical intervention  . History of kidney stones    age 30  . History of peripheral edema    lower extremities  . History of thyroid nodule    multinodular goiter  s/p  total thyroidectomy 2014  . HTN (hypertension)     . Hydronephrosis, left   . Hypothyroidism   . Mild sleep apnea    per study 04-14-2014  . Nocturia   . Osteoporosis   . PAF (paroxysmal atrial fibrillation) ([place])   . PONV (postoperative nausea and vomiting)   . Pulmonary nodules    stable per last ct  . Renal cell carcinoma of left kidney ([facility])   . Shortness of breath    intermittant  . Superficial thrombophlebitis    Past Surgical History:  Procedure Laterality Date  . CARDIOVASCULAR STRESS TEST  04-20-2015  dr Caryl Comes   normal nuclear study/  no ischemia/  normal LV function and wall motion, ef 74%  . COLONOSCOPY  01/19/2013  . COLOSTOMY TAKEDOWN  07-22-2002  . CYSTOSCOPY WITH RETROGRADE PYELOGRAM, URETEROSCOPY AND STENT PLACEMENT Left 07/28/2015   Procedure: CYSTOSCOPY WITH LEFT RETROGRADE PYELOGRAM, POSSIBLE LEFT URETEROSCOPY AND STENT PLACEMENT;  Surgeon: Franchot Gallo, MD;  Location: [facility];  Service: Urology;  Laterality: Left;  . EXPLORATORY LAPARTOMY SIGMOID COLECTOMY/ COLOSTOMY  04-28-2002   perforated diverticulitis  . LAPAROSCOPY VENTRAL HERNIA REPAIR/  EXTENSIVE LYSIS ADHESIONS  05-04-2004  . PACEMAKER GENERATOR CHANGE Left 09/16/2011   MDT [place] pacemaker  . PACEMAKER PLACEMENT  05-11-2003     medtronic  . ROBOT ASSISTED LAPAROSCOPIC NEPHRECTOMY Left 03/20/2016   Procedure: XI ROBOTIC ASSISTED LAPAROSCOPIC RETROPERITONEAL NEPHRECTOMY;  Surgeon: Alexis Frock, MD;  Location: [facility];  Service: Urology;  Laterality: Left;  . THYROIDECTOMY N/A 08/07/2012   Procedure: THYROIDECTOMY;  Surgeon: Earnstine Regal, MD;  Location: [facility];  Service: General;  Laterality: N/A;  . TRANSTHORACIC ECHOCARDIOGRAM  10-12-2014   moderate focal basal LVH,  ef 55-60%,  grade 2 diastolic dysfunction, mild AV calcification without stenosis,  mild MR,  trivial PR   Family History  Problem Relation Age of Onset  . Heart disease Mother   . Stroke Mother   . Hypertension Mother   . Heart failure Father   . Hypertension  Brother   . Heart disease Brother   . Diverticulosis Sister   . Hypertension Brother   . Cancer Son     breast  . Heart attack Neg Hx    History  Sexual Activity  . Sexual activity: Not on file    Outpatient Encounter Prescriptions as of 06/03/2016  Medication Sig  . acetaminophen (TYLENOL) 500 MG tablet Take 1,000 mg by mouth daily as needed for moderate pain.  [name] amLODipine (NORVASC) 5 MG tablet Take 1 tablet (5 mg total) by mouth daily.  [name] denosumab (PROLIA) 60 MG/ML SOLN Inject 60 mg into the skin every 6 (six) months. Reported on 09/07/2015  . diphenhydramine-acetaminophen (TYLENOL PM) 25-500 MG TABS tablet Take 2 tablets by mouth at bedtime as needed (sleep/pain).  [name] levothyroxine (SYNTHROID, LEVOTHROID) 88 MCG tablet TAKE ONE TABLET BY MOUTH ONCE DAILY BEFORE BREAKFAST  . lisinopril-hydrochlorothiazide (ZESTORETIC) 20-12.5 MG tablet Take 2 tablets by mouth daily.  [name] loratadine (CLARITIN) 10 MG tablet Take 1 tablet (10 mg total) by mouth daily. (Patient taking differently: Take 10 mg by mouth daily as needed for allergies. )  . omeprazole (PRILOSEC) 20 MG capsule Take 20 mg by mouth daily as  needed (for heartburn and indigestion).  . [DISCONTINUED] fluticasone (FLONASE) 50 MCG/ACT nasal spray Place 2 sprays into both nostrils daily.  . fluticasone (FLONASE) 50 MCG/ACT nasal spray Place 2 sprays into both nostrils daily as needed for allergies or rhinitis.  [name] HYDROcodone-acetaminophen (NORCO) 5-325 MG tablet Take 1-2 tablets by mouth every 6 (six) hours as needed for moderate pain. (Patient not taking: Reported on 06/03/2016)  . senna-docusate (SENOKOT-S) 8.6-50 MG tablet Take 1 tablet by mouth 2 (two) times daily. While taking pain meds to prevent constipation (Patient not taking: Reported on 06/03/2016)   Facility-Administered Encounter Medications as of 06/03/2016  Medication  . aminophylline injection 150 mg    Activities of Daily Living In your present state of health, do you  have any difficulty performing the following activities: 06/03/2016 03/20/2016  Hearing? N N  Vision? N N  Difficulty concentrating or making decisions? N N  Walking or climbing stairs? N N  Dressing or bathing? N N  Doing errands, shopping? N N  Some recent data might be hidden    Patient Care Team: [name], DO as PCP - General (Family Medicine) Patsey Berthold, NP as Nurse Practitioner (Cardiology) Alexis Frock, MD as Consulting Physician (Urology) Deboraha Sprang, MD as Consulting Physician (Cardiology) Ronald Lobo, MD as Consulting Physician (Gastroenterology)    Assessment:    CPE Exercise Activities and Dietary recommendations Current Exercise Habits: Structured exercise class, Type of exercise: yoga;Other - see comments, Time (Minutes): 60 (silver sneakers), Frequency (Times/Week): 5, Weekly Exercise (Minutes/Week): 300, Intensity: Moderate, Exercise limited by: None identified  Goals    None     Fall Risk Fall Risk  06/03/2016 02/28/2015 11/12/2013  Falls in the past year? No No No   Depression Screen PHQ 2/9 Scores 06/03/2016 02/28/2015 11/12/2013  PHQ - 2 Score 0 0 0     Cognitive Function MMSE - Mini Mental State Exam 06/03/2016  Orientation to time 5  Orientation to Place 5  Registration 3  Attention/ Calculation 5  Recall 3  Language- name 2 objects 2  Language- repeat 1  Language- follow 3 step command 3  Language- read & follow direction 1  Write a sentence 1  Copy design 1  Total score 30        Immunization History  Administered Date(s) Administered  . Influenza Split 04/10/2011, 03/10/2012  . Influenza Whole 03/24/2008, 04/11/2009  . Influenza,inj,Quad PF,36+ Mos 03/24/2013, 03/24/2014, 04/04/2015, 03/21/2016  . Pneumococcal Conjugate-13 06/02/2013  . Pneumococcal Polysaccharide-23 08/29/2014   Screening Tests Health Maintenance  Topic Date Due  . TETANUS/TDAP  08/01/1957  . ZOSTAVAX  08/01/1998  . INFLUENZA VACCINE   Completed  . DEXA SCAN  Completed  . PNA vac Low Risk Adult  Completed      Plan:   see AVS During the course of the visit the patient was educated and counseled about the following appropriate screening and preventive services:   Vaccines to include Pneumoccal, Influenza, Hepatitis B, Td, Zostavax, HCV  Electrocardiogram  Cardiovascular Disease  Colorectal cancer screening  Bone density screening  Diabetes screening  Glaucoma screening  Mammography/PAP  Nutrition counseling  Patient Instructions (the written plan) was given to the patient.  1. Essential hypertension stable - Comprehensive metabolic panel; Future - Lipid panel; Future - CBC with Differential/Platelet; Future - POCT urinalysis dipstick; Future - amLODipine (NORVASC) 5 MG tablet; Take 1 tablet (5 mg total) by mouth daily.  Dispense: 90 tablet; Refill: 3 - lisinopril-hydrochlorothiazide (ZESTORETIC) 20-12.5  MG tablet; Take 2 tablets by mouth daily.  Dispense: 180 tablet; Refill: 3  2. Chronic low back pain, unspecified back pain laterality, with sciatica presence unspecified  - traMADol (ULTRAM) 50 MG tablet; Take 1 tablet (50 mg total) by mouth every 8 (eight) hours as needed.  Dispense: 30 tablet; Refill: 0  3. Encounter for Medicare annual wellness exam   4. Hypothyroidism, unspecified type  - levothyroxine (SYNTHROID, LEVOTHROID) 88 MCG tablet; TAKE ONE TABLET BY MOUTH ONCE DAILY BEFORE BREAKFAST  Dispense: 90 tablet; Refill: 3  5. Gastroesophageal reflux disease, esophagitis presence not specified  - omeprazole (PRILOSEC) 20 MG capsule; Take 1 capsule (20 mg total) by mouth daily as needed (for heartburn and indigestion).  Dispense: 30 capsule; Refill: [place], [place]  06/03/2016

## 2016-06-03 NOTE — Progress Notes (Signed)
Pre visit review using our clinic review tool, if applicable. No additional management support is needed unless otherwise documented below in the visit note. 

## 2016-06-03 NOTE — Patient Instructions (Signed)
Preventive Care 65 Years and Older, Female Preventive care refers to lifestyle choices and visits with your health care provider that can promote health and wellness. What does preventive care include?  A yearly physical exam. This is also called an annual well check.  Dental exams once or twice a year.  Routine eye exams. Ask your health care provider how often you should have your eyes checked.  Personal lifestyle choices, including:  Daily care of your teeth and gums.  Regular physical activity.  Eating a healthy diet.  Avoiding tobacco and drug use.  Limiting alcohol use.  Practicing safe sex.  Taking low-dose aspirin every day.  Taking vitamin and mineral supplements as recommended by your health care provider. What happens during an annual well check? The services and screenings done by your health care provider during your annual well check will depend on your age, overall health, lifestyle risk factors, and family history of disease. Counseling  Your health care provider may ask you questions about your:  Alcohol use.  Tobacco use.  Drug use.  Emotional well-being.  Home and relationship well-being.  Sexual activity.  Eating habits.  History of falls.  Memory and ability to understand (cognition).  Work and work environment.  Reproductive health. Screening  You may have the following tests or measurements:  Height, weight, and BMI.  Blood pressure.  Lipid and cholesterol levels. These may be checked every 5 years, or more frequently if you are over 50 years old.  Skin check.  Lung cancer screening. You may have this screening every year starting at age 55 if you have a 30-pack-year history of smoking and currently smoke or have quit within the past 15 years.  Fecal occult blood test (FOBT) of the stool. You may have this test every year starting at age 50.  Flexible sigmoidoscopy or colonoscopy. You may have a sigmoidoscopy every 5 years or  a colonoscopy every 10 years starting at age 50.  Hepatitis C blood test.  Hepatitis B blood test.  Sexually transmitted disease (STD) testing.  Diabetes screening. This is done by checking your blood sugar (glucose) after you have not eaten for a while (fasting). You may have this done every 1-3 years.  Bone density scan. This is done to screen for osteoporosis. You may have this done starting at age 77.  Mammogram. This may be done every 1-2 years. Talk to your health care provider about how often you should have regular mammograms. Talk with your health care provider about your test results, treatment options, and if necessary, the need for more tests. Vaccines  Your health care provider may recommend certain vaccines, such as:  Influenza vaccine. This is recommended every year.  Tetanus, diphtheria, and acellular pertussis (Tdap, Td) vaccine. You may need a Td booster every 10 years.  Varicella vaccine. You may need this if you have not been vaccinated.  Zoster vaccine. You may need this after age 60.  Measles, mumps, and rubella (MMR) vaccine. You may need at least one dose of MMR if you were born in 1957 or later. You may also need a second dose.  Pneumococcal 13-valent conjugate (PCV13) vaccine. One dose is recommended after age 77.  Pneumococcal polysaccharide (PPSV23) vaccine. One dose is recommended after age 77.  Meningococcal vaccine. You may need this if you have certain conditions.  Hepatitis A vaccine. You may need this if you have certain conditions or if you travel or work in places where you may be exposed to   hepatitis A.  Hepatitis B vaccine. You may need this if you have certain conditions or if you travel or work in places where you may be exposed to hepatitis B.  Haemophilus influenzae type b (Hib) vaccine. You may need this if you have certain conditions. Talk to your health care provider about which screenings and vaccines you need and how often you need  them. This information is not intended to replace advice given to you by your health care provider. Make sure you discuss any questions you have with your health care provider. Document Released: 07/14/2015 Document Revised: 03/06/2016 Document Reviewed: 04/18/2015 Elsevier Interactive Patient Education  2017 [employer]vier Inc.  

## 2016-06-04 MED ORDER — AMLODIPINE BESYLATE 5 MG PO TABS: 5.0000 mg | ORAL_TABLET | Freq: Every day | ORAL | 3 refills | Status: DC

## 2016-06-04 MED ORDER — LISINOPRIL-HYDROCHLOROTHIAZIDE 20-12.5 MG PO TABS: 2.0000 | ORAL_TABLET | Freq: Every day | ORAL | 3 refills | Status: DC

## 2016-06-04 MED ORDER — LEVOTHYROXINE SODIUM 88 MCG PO TABS: ORAL_TABLET | ORAL | 3 refills | Status: DC

## 2016-06-04 MED ORDER — OMEPRAZOLE 20 MG PO CPDR: 20.0000 mg | DELAYED_RELEASE_CAPSULE | Freq: Every day | ORAL | 11 refills | Status: DC | PRN

## 2016-06-06 ENCOUNTER — Ambulatory Visit (INDEPENDENT_AMBULATORY_CARE_PROVIDER_SITE_OTHER): Payer: Medicare Other | Admitting: *Deleted

## 2016-06-06 ENCOUNTER — Telehealth: Payer: Self-pay | Admitting: Cardiology

## 2016-06-06 DIAGNOSIS — I442 Atrioventricular block, complete: Secondary | ICD-10-CM

## 2016-06-06 NOTE — Progress Notes (Signed)
Remote pacemaker transmission.   

## 2016-06-06 NOTE — Telephone Encounter (Signed)
Spoke with pt and reminded pt of remote transmission that is due today. Pt verbalized understanding.   

## 2016-06-07 ENCOUNTER — Other Ambulatory Visit (INDEPENDENT_AMBULATORY_CARE_PROVIDER_SITE_OTHER): Payer: Medicare Other

## 2016-06-07 ENCOUNTER — Other Ambulatory Visit: Payer: Self-pay

## 2016-06-07 DIAGNOSIS — I10 Essential (primary) hypertension: Secondary | ICD-10-CM | POA: Diagnosis not present

## 2016-06-07 DIAGNOSIS — E7800 Pure hypercholesterolemia, unspecified: Secondary | ICD-10-CM

## 2016-06-07 DIAGNOSIS — R7309 Other abnormal glucose: Secondary | ICD-10-CM | POA: Diagnosis not present

## 2016-06-07 LAB — COMPREHENSIVE METABOLIC PANEL
ALBUMIN: 4.6 g/dL (ref 3.5–5.2)
ALT: 13 U/L (ref 0–35)
AST: 17 U/L (ref 0–37)
Alkaline Phosphatase: 76 U/L (ref 39–117)
BILIRUBIN TOTAL: 0.6 mg/dL (ref 0.2–1.2)
BUN: 27 mg/dL — ABNORMAL HIGH (ref 6–23)
CALCIUM: 9.9 mg/dL (ref 8.4–10.5)
CHLORIDE: 98 meq/L (ref 96–112)
CO2: 30 mEq/L (ref 19–32)
CREATININE: 1.15 mg/dL (ref 0.40–1.20)
GFR: 58.71 mL/min — AB (ref >60.00)
Glucose, Bld: 101 mg/dL — ABNORMAL HIGH (ref 70–99)
Potassium: 4.2 mEq/L (ref 3.5–5.1)
Sodium: 136 mEq/L (ref 135–145)
Total Protein: 7.3 g/dL (ref 6.0–8.3)

## 2016-06-07 LAB — CBC WITH DIFFERENTIAL/PLATELET
BASOS ABS: 0 10*3/uL (ref 0.0–0.1)
BASOS PCT: 0.7 % (ref 0.0–3.0)
EOS ABS: 0.1 10*3/uL (ref 0.0–0.7)
Eosinophils Relative: 1.3 % (ref 0.0–5.0)
HEMATOCRIT: 37.4 % (ref 36.0–46.0)
HEMOGLOBIN: 12.5 g/dL (ref 12.0–15.0)
LYMPHS PCT: 24 % (ref 12.0–46.0)
Lymphs Abs: 1.1 10*3/uL (ref 0.7–4.0)
MCHC: 33.5 g/dL (ref 30.0–36.0)
MCV: 82 fl (ref 78.0–100.0)
MONO ABS: 0.4 10*3/uL (ref 0.1–1.0)
Monocytes Relative: 9.2 % (ref 3.0–12.0)
Neutro Abs: 2.8 10*3/uL (ref 1.4–7.7)
Neutrophils Relative %: 64.8 % (ref 43.0–77.0)
Platelets: 180 10*3/uL (ref 150.0–400.0)
RBC: 4.56 Mil/uL (ref 3.87–5.11)
RDW: 12.7 % (ref 11.5–15.5)
WBC: 4.4 10*3/uL (ref 4.0–10.5)

## 2016-06-07 LAB — LIPID PANEL
CHOL/HDL RATIO: 4
CHOLESTEROL: 188 mg/dL (ref 0–200)
HDL: 53.4 mg/dL (ref >39.00)
LDL CALC: 118 mg/dL — AB (ref 0–99)
NonHDL: 134.18
TRIGLYCERIDES: 79 mg/dL (ref 0.0–149.0)
VLDL: 15.8 mg/dL (ref 0.0–40.0)

## 2016-06-07 LAB — POC URINALSYSI DIPSTICK (AUTOMATED)
Bilirubin, UA: NEGATIVE
Blood, UA: NEGATIVE
Glucose, UA: NEGATIVE
KETONES UA: NEGATIVE
Leukocytes, UA: NEGATIVE
Nitrite, UA: NEGATIVE
PH UA: 6.5
PROTEIN UA: NEGATIVE
SPEC GRAV UA: 1.01
UROBILINOGEN UA: 0.2

## 2016-06-07 LAB — HEMOGLOBIN A1C: HEMOGLOBIN A1C: 5.5 % (ref 4.6–6.5)

## 2016-06-07 NOTE — Addendum Note (Signed)
Addended by: Harl Bowie on: 06/07/2016 09:37 AM   Modules accepted: Orders

## 2016-06-07 NOTE — Addendum Note (Signed)
Addended by: Caffie Pinto on: 06/07/2016 12:12 PM   Modules accepted: Orders

## 2016-06-07 NOTE — Addendum Note (Signed)
Addended by: Harl Bowie on: 06/07/2016 10:00 AM   Modules accepted: Orders

## 2016-06-12 ENCOUNTER — Encounter: Payer: Self-pay | Admitting: Cardiology

## 2016-07-03 ENCOUNTER — Telehealth: Payer: Self-pay

## 2016-07-03 LAB — CUP PACEART REMOTE DEVICE CHECK
Battery Impedance: 348 Ohm
Brady Statistic AP VS Percent: 0 %
Brady Statistic AS VS Percent: 0 %
Implantable Lead Implant Date: 20041110
Implantable Lead Implant Date: 20041110
Implantable Lead Location: 753860
Lead Channel Impedance Value: 371 Ohm
Lead Channel Pacing Threshold Amplitude: 1 V
Lead Channel Pacing Threshold Pulse Width: 0.4 ms
Lead Channel Setting Pacing Amplitude: 2 V
Lead Channel Setting Pacing Amplitude: 2.5 V
Lead Channel Setting Sensing Sensitivity: 4 mV
MDC IDC LEAD LOCATION: 753859
MDC IDC MSMT BATTERY REMAINING LONGEVITY: 89 mo
MDC IDC MSMT BATTERY VOLTAGE: 2.78 V
MDC IDC MSMT LEADCHNL RA IMPEDANCE VALUE: 534 Ohm
MDC IDC MSMT LEADCHNL RA PACING THRESHOLD AMPLITUDE: 0.5 V
MDC IDC MSMT LEADCHNL RV PACING THRESHOLD PULSEWIDTH: 0.4 ms
MDC IDC PG IMPLANT DT: 20130318
MDC IDC SESS DTM: 20171207170037
MDC IDC SET LEADCHNL RV PACING PULSEWIDTH: 0.4 ms
MDC IDC STAT BRADY AP VP PERCENT: 1 %
MDC IDC STAT BRADY AS VP PERCENT: 99 %

## 2016-07-03 NOTE — Telephone Encounter (Signed)
Called pt to discuss new afib on remote transmission, informed pt that Dr. Caryl Comes would like to see pt in office to discuss, pt agreeable to apt on 07/16/2016 at 2:45pm.

## 2016-07-08 MED FILL — SM ALLERGY RELIEF 50 MCG SP: 50 MCG | 30 days supply | Qty: 16 | Fill #1

## 2016-07-08 MED FILL — AMLODIPINE BESYLATE 5 MG TA: 5 | 90 days supply | Qty: 90 | Fill #3

## 2016-07-09 ENCOUNTER — Telehealth: Payer: Self-pay | Admitting: *Deleted

## 2016-07-09 MED FILL — LEVOTHYROXINE 88 MCG TABLET: 88 | 90 days supply | Qty: 90 | Fill #1

## 2016-07-09 NOTE — Telephone Encounter (Signed)
Ok to change if pt ok with it.  We will need to recheck TSh in 2 months

## 2016-07-09 NOTE — Telephone Encounter (Signed)
[facility] [address] [facility], sent a fax in regards to levothyroxine:  Narrow therapeutic medication-needs prescribers permission to change to a different generic manufacture-was [employer] brand- due to shortage would it be ok to change to [employer] brand?

## 2016-07-12 NOTE — Telephone Encounter (Signed)
Left message on machine to call back  

## 2016-07-16 ENCOUNTER — Ambulatory Visit (INDEPENDENT_AMBULATORY_CARE_PROVIDER_SITE_OTHER): Payer: Medicare Other | Admitting: Internal Medicine

## 2016-07-16 ENCOUNTER — Encounter: Payer: Self-pay | Admitting: Internal Medicine

## 2016-07-16 ENCOUNTER — Encounter: Payer: Self-pay | Admitting: *Deleted

## 2016-07-16 VITALS — BP 152/70 | HR 73 | Ht 63.0 in | Wt 134.2 lb

## 2016-07-16 DIAGNOSIS — I442 Atrioventricular block, complete: Secondary | ICD-10-CM | POA: Diagnosis not present

## 2016-07-16 DIAGNOSIS — I472 Ventricular tachycardia, unspecified: Secondary | ICD-10-CM

## 2016-07-16 DIAGNOSIS — I4819 Other persistent atrial fibrillation: Secondary | ICD-10-CM

## 2016-07-16 DIAGNOSIS — Z950 Presence of cardiac pacemaker: Secondary | ICD-10-CM

## 2016-07-16 DIAGNOSIS — I481 Persistent atrial fibrillation: Secondary | ICD-10-CM | POA: Diagnosis not present

## 2016-07-16 MED ORDER — APIXABAN 5 MG PO TABS: 5.0000 mg | ORAL_TABLET | Freq: Two times a day (BID) | ORAL | Status: DC

## 2016-07-16 NOTE — Patient Instructions (Addendum)
Medication Instructions: Your physician has recommended you make the following change in your medication:  --1. START Eliquis 5 mg - Take 1 tablet by mouth twice daily (samples given)   Labwork: None Ordered   Procedures/Testing: Your physician has recommended that you have a Cardioversion (DCCV). Electrical Cardioversion uses a jolt of electricity to your heart either through paddles or wired patches attached to your chest. This is a controlled, usually prescheduled, procedure. Defibrillation is done under light anesthesia in the hospital, and you usually go home the day of the procedure. This is done to get your heart back into a normal rhythm. You are not awake for the procedure. Please see the instruction sheet given to you today.    Follow-Up: Your physician recommends that you schedule a follow-up appointment in 3-4 WEEKS (from 07/24/16) with Marinus Maw, PA  Any Additional Special Instructions Will Be Listed Below (If Applicable).     If you need a refill on your cardiac medications before your next appointment, please call your pharmacy.

## 2016-07-16 NOTE — Telephone Encounter (Signed)
Patient picked up medication and was not sure if it was different or not.  I will call the pharmacy to check and to see if she needs to come back in 2 months or not at 9.

## 2016-07-16 NOTE — Progress Notes (Signed)
So I think to receive 5 people in sync to see  Patient Care Team: [name], DO as PCP - General (Family Medicine) Patsey Berthold, NP as Nurse Practitioner (Cardiology) Alexis Frock, MD as Consulting Physician (Urology) Deboraha Sprang, MD as Consulting Physician (Cardiology) Ronald Lobo, MD as Consulting Physician (Gastroenterology)   HPI  [name] is a 76 y.o. female Seen in followup for a pacemaker implanted for high-grade heart block which reached ERI 3/13 at that time underwent generator replacement.  Because of some dypsnea she underwent echo >>4/16 normal LV function 10/16 Myoview normal LV function and no ischemia Renal function Cr 0.7  9/15  .   She's been noted to have atrial fibrillation intercurrently.    she comes in today feeling lousy with dizziness and fatigue shortness of breath     Past Medical History:  Diagnosis Date  . Atrioventricular block, complete ([place])    a. 08/2011 Upgrage of PPM to MDT Adapta L Dual Chamber PPM ser # [id number] H.  . Cardiac pacemaker in situ   . Contact dermatitis and eczema    unspec cause  . Dairy product intolerance   . Depression   . Diastolic dysfunction   . GERD (gastroesophageal reflux disease)   . Hiatal hernia   . History of CHF (congestive heart failure)   . History of diverticulitis of colon    2003  perforated diverticulitis w/ surgical intervention  . History of kidney stones    age 60  . History of peripheral edema    lower extremities  . History of thyroid nodule    multinodular goiter  s/p  total thyroidectomy 2014  . HTN (hypertension)   . Hydronephrosis, left   . Hypothyroidism   . Mild sleep apnea    per study 04-14-2014  . Nocturia   . Osteoporosis   . PAF (paroxysmal atrial fibrillation) ([place])   . PONV (postoperative nausea and vomiting)   . Pulmonary nodules    stable per last ct  . Renal cell carcinoma of left kidney (HCC)   . Shortness of breath    intermittant  .  Superficial thrombophlebitis     Past Surgical History:  Procedure Laterality Date  . CARDIOVASCULAR STRESS TEST  04-20-2015  dr Caryl Comes   normal nuclear study/  no ischemia/  normal LV function and wall motion, ef 74%  . COLONOSCOPY  01/19/2013  . COLOSTOMY TAKEDOWN  07-22-2002  . CYSTOSCOPY WITH RETROGRADE PYELOGRAM, URETEROSCOPY AND STENT PLACEMENT Left 07/28/2015   Procedure: CYSTOSCOPY WITH LEFT RETROGRADE PYELOGRAM, POSSIBLE LEFT URETEROSCOPY AND STENT PLACEMENT;  Surgeon: Franchot Gallo, MD;  Location: [facility];  Service: Urology;  Laterality: Left;  . EXPLORATORY LAPARTOMY SIGMOID COLECTOMY/ COLOSTOMY  04-28-2002   perforated diverticulitis  . LAPAROSCOPY VENTRAL HERNIA REPAIR/  EXTENSIVE LYSIS ADHESIONS  05-04-2004  . PACEMAKER GENERATOR CHANGE Left 09/16/2011   MDT [place] pacemaker  . PACEMAKER PLACEMENT  05-11-2003     medtronic  . ROBOT ASSISTED LAPAROSCOPIC NEPHRECTOMY Left 03/20/2016   Procedure: XI ROBOTIC ASSISTED LAPAROSCOPIC RETROPERITONEAL NEPHRECTOMY;  Surgeon: Alexis Frock, MD;  Location: [facility];  Service: Urology;  Laterality: Left;  . THYROIDECTOMY N/A 08/07/2012   Procedure: THYROIDECTOMY;  Surgeon: Earnstine Regal, MD;  Location: [facility];  Service: General;  Laterality: N/A;  . TRANSTHORACIC ECHOCARDIOGRAM  10-12-2014   moderate focal basal LVH,  ef 55-60%,  grade 2 diastolic dysfunction, mild AV calcification without stenosis,  mild MR,  trivial PR  Current Outpatient Prescriptions  Medication Sig Dispense Refill  . acetaminophen (TYLENOL) 500 MG tablet Take 1,000 mg by mouth daily as needed for moderate pain.    [name] amLODipine (NORVASC) 5 MG tablet Take 1 tablet (5 mg total) by mouth daily. 90 tablet 3  . denosumab (PROLIA) 60 MG/ML SOLN Inject 60 mg into the skin every 6 (six) months. Reported on 09/07/2015    . diphenhydramine-acetaminophen (TYLENOL PM) 25-500 MG TABS tablet Take 2 tablets by mouth at bedtime as needed (sleep/pain).    .  fluticasone (FLONASE) 50 MCG/ACT nasal spray Place 2 sprays into both nostrils daily as needed for allergies or rhinitis.    [name] levothyroxine (SYNTHROID, LEVOTHROID) 88 MCG tablet TAKE ONE TABLET BY MOUTH ONCE DAILY BEFORE BREAKFAST 90 tablet 3  . lisinopril-hydrochlorothiazide (ZESTORETIC) 20-12.5 MG tablet Take 2 tablets by mouth daily. 180 tablet 3  . loratadine (CLARITIN) 10 MG tablet Take 1 tablet (10 mg total) by mouth daily. (Patient taking differently: Take 10 mg by mouth daily as needed for allergies. ) 90 tablet 3  . omeprazole (PRILOSEC) 20 MG capsule Take 1 capsule (20 mg total) by mouth daily as needed (for heartburn and indigestion). 30 capsule 11  . traMADol (ULTRAM) 50 MG tablet Take 1 tablet (50 mg total) by mouth every 8 (eight) hours as needed. 30 tablet 0   No current facility-administered medications for this visit.    Facility-Administered Medications Ordered in Other Visits  Medication Dose Route Frequency Provider Last Rate Last Dose  . aminophylline injection 150 mg  150 mg Intravenous BID PRN Dorothy Spark, MD   150 mg at 04/20/15 1200    Allergies  Allergen Reactions  . Tape Rash  . Codeine Nausea And Vomiting    Review of Systems negative except from HPI and PMH  Physical Exam BP (!) 152/70   Pulse 73   Ht 5\' 3"  (1.6 m)   Wt 134 lb 3.2 oz (60.9 kg)   SpO2 98%   BMI 23.77 kg/m  Well developed and well nourished in no acute distress HENT normal E scleral and icterus clear Neck Supple; scar JVP flat; carotids brisk and full Clear to ausculation Device pocket well healed; without hematoma or erythema Regular rate and rhythm, no murmurs gallops or rub Soft with active bowel sounds No clubbing cyanosis no Edema Alert and oriented, grossly normal motor and sensory function Skin Warm and Dry  ECG demonstrates atrial fib V pacing V  Assessment and  Plan  Complete heart block   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  The information was reviewed. No changes were made in the programming.    Atrial fibrillation  Dizziness   [name] has developed atrial fibrillation and with his symptoms that are consistent with it i.e. dizziness and fatigue. We will begin her on anticoagulation. We will start her on apixoban. 5 mg twice daily. Will anticipate cardioversion next week with TEE . We have discussed issues of rhythm and rate control. Hopefully she will maintain sinus rhythm. We look forward to seeing the change in her symptom following restoration of sinus rhythm to see if we can implicate this. One of the other potential explanations for her dizziness could be a CVA. She does have an MRI compatible device.  I trust that much of her symptoms are 2/2 Afib  BP well controlled \ No intercurrent Ventricular tachycardia

## 2016-07-19 ENCOUNTER — Encounter: Payer: Self-pay | Admitting: Family Medicine

## 2016-07-19 LAB — CUP PACEART INCLINIC DEVICE CHECK
Battery Impedance: 348 Ohm
Brady Statistic AP VS Percent: 0 %
Date Time Interrogation Session: 20180116215025
Implantable Lead Implant Date: 20041110
Implantable Lead Location: 753859
Implantable Lead Location: 753860
Lead Channel Impedance Value: 517 Ohm
Lead Channel Pacing Threshold Amplitude: 1 V
Lead Channel Pacing Threshold Pulse Width: 0.4 ms
Lead Channel Pacing Threshold Pulse Width: 0.4 ms
Lead Channel Pacing Threshold Pulse Width: 0.4 ms
Lead Channel Pacing Threshold Pulse Width: 0.46 ms
Lead Channel Sensing Intrinsic Amplitude: 4 mV
Lead Channel Sensing Intrinsic Amplitude: 5.6 mV
Lead Channel Setting Pacing Amplitude: 2.5 V
Lead Channel Setting Pacing Pulse Width: 0.46 ms
MDC IDC LEAD IMPLANT DT: 20041110
MDC IDC MSMT BATTERY REMAINING LONGEVITY: 87 mo
MDC IDC MSMT BATTERY VOLTAGE: 2.78 V
MDC IDC MSMT LEADCHNL RA PACING THRESHOLD AMPLITUDE: 0.5 V
MDC IDC MSMT LEADCHNL RA PACING THRESHOLD AMPLITUDE: 0.5 V
MDC IDC MSMT LEADCHNL RV IMPEDANCE VALUE: 377 Ohm
MDC IDC MSMT LEADCHNL RV PACING THRESHOLD AMPLITUDE: 1.125 V
MDC IDC PG IMPLANT DT: 20130318
MDC IDC SET LEADCHNL RA PACING AMPLITUDE: 2 V
MDC IDC SET LEADCHNL RV SENSING SENSITIVITY: 4 mV
MDC IDC STAT BRADY AP VP PERCENT: 1 %
MDC IDC STAT BRADY AS VP PERCENT: 99 %
MDC IDC STAT BRADY AS VS PERCENT: 0 %

## 2016-07-19 MED FILL — traMADol HCL 50 MG TABS: 50 | 10 days supply | Qty: 30 | Fill #0

## 2016-07-19 NOTE — Telephone Encounter (Signed)
Labs done 12/8-- ok to schedule prolia

## 2016-07-19 NOTE — Telephone Encounter (Signed)
Patient notified.  Spoke with [employer] and she got the same brand that she had gotten before.  I advised that she call us when that change is made so that we can check her thyroid level.

## 2016-07-25 ENCOUNTER — Encounter (HOSPITAL_COMMUNITY)
Admission: RE | Disposition: A | Discharge status: Home / Self Care | Payer: Self-pay | Source: Ambulatory Visit | Attending: Internal Medicine

## 2016-07-25 ENCOUNTER — Encounter (HOSPITAL_COMMUNITY): Payer: Self-pay | Admitting: Certified Registered Nurse Anesthetist

## 2016-07-25 ENCOUNTER — Ambulatory Visit (HOSPITAL_COMMUNITY)
Admission: RE | Admit: 2016-07-25 | Discharge: 2016-07-25 | Disposition: A | Discharge status: Home / Self Care | Payer: Medicare Other | Source: Ambulatory Visit | Attending: Internal Medicine | Admitting: Internal Medicine

## 2016-07-25 ENCOUNTER — Ambulatory Visit (HOSPITAL_COMMUNITY): Payer: Medicare Other

## 2016-07-25 DIAGNOSIS — Z538 Procedure and treatment not carried out for other reasons: Secondary | ICD-10-CM | POA: Diagnosis not present

## 2016-07-25 DIAGNOSIS — I442 Atrioventricular block, complete: Secondary | ICD-10-CM

## 2016-07-25 DIAGNOSIS — I4819 Other persistent atrial fibrillation: Secondary | ICD-10-CM

## 2016-07-25 DIAGNOSIS — I4891 Unspecified atrial fibrillation: Secondary | ICD-10-CM | POA: Diagnosis present

## 2016-07-25 DIAGNOSIS — Z950 Presence of cardiac pacemaker: Secondary | ICD-10-CM

## 2016-07-25 DIAGNOSIS — I472 Ventricular tachycardia, unspecified: Secondary | ICD-10-CM

## 2016-07-25 SURGERY — CANCELLED PROCEDURE

## 2016-07-25 MED ORDER — SODIUM CHLORIDE 0.9 % IV SOLN: INTRAVENOUS | Status: DC

## 2016-07-25 NOTE — H&P (View-Only) (Signed)
So I think to receive 5 people in sync to see  Patient Care Team: Daisy Held, DO as PCP - General (Family Medicine) Daisy Berthold, NP as Nurse Practitioner (Cardiology) Daisy Frock, MD as Consulting Physician (Urology) Daisy Sprang, MD as Consulting Physician (Cardiology) Daisy Lobo, MD as Consulting Physician (Gastroenterology)   HPI  [name] is a 76 y.o. female Seen in followup for a pacemaker implanted for high-grade heart block which reached ERI 3/13 at that time underwent generator replacement.  Because of some dypsnea she underwent echo >>4/16 normal LV function 10/16 Myoview normal LV function and no ischemia Renal function Cr 0.7  9/15  .   She's been noted to have atrial fibrillation intercurrently.    she [name] in today feeling lousy with dizziness and fatigue shortness of breath     Past Medical History:  Diagnosis Date  . Atrioventricular block, complete ([place])    a. 08/2011 Upgrage of PPM to MDT Adapta L Dual Chamber PPM ser # [id number] H.  . Cardiac pacemaker in situ   . Contact dermatitis and eczema    unspec cause  . Dairy product intolerance   . Depression   . Diastolic dysfunction   . GERD (gastroesophageal reflux disease)   . Hiatal hernia   . History of CHF (congestive heart failure)   . History of diverticulitis of colon    2003  perforated diverticulitis w/ surgical intervention  . History of kidney stones    age 88  . History of peripheral edema    lower extremities  . History of thyroid nodule    multinodular goiter  s/p  total thyroidectomy 2014  . HTN (hypertension)   . Hydronephrosis, left   . Hypothyroidism   . Mild sleep apnea    per study 04-14-2014  . Nocturia   . Osteoporosis   . PAF (paroxysmal atrial fibrillation) ([place])   . PONV (postoperative nausea and vomiting)   . Pulmonary nodules    stable per last ct  . Renal cell carcinoma of left kidney (HCC)   . Shortness of breath    intermittant  .  Superficial thrombophlebitis     Past Surgical History:  Procedure Laterality Date  . CARDIOVASCULAR STRESS TEST  04-20-2015  dr [name]   normal nuclear study/  no ischemia/  normal LV function and wall motion, ef 74%  . COLONOSCOPY  01/19/2013  . COLOSTOMY TAKEDOWN  07-22-2002  . CYSTOSCOPY WITH RETROGRADE PYELOGRAM, URETEROSCOPY AND STENT PLACEMENT Left 07/28/2015   Procedure: CYSTOSCOPY WITH LEFT RETROGRADE PYELOGRAM, POSSIBLE LEFT URETEROSCOPY AND STENT PLACEMENT;  Surgeon: Daisy Gallo, MD;  Location[facility];  Service: Urology;  Laterality: Left;  . EXPLORATORY LAPARTOMY SIGMOID COLECTOMY/ COLOSTOMY  04-28-2002   perforated diverticulitis  . LAPAROSCOPY VENTRAL HERNIA REPAIR/  EXTENSIVE LYSIS ADHESIONS  05-04-2004  . PACEMAKER GENERATOR CHANGE Left 09/16/2011   MDT [place] pacemaker  . PACEMAKER PLACEMENT  05-11-2003     medtronic  . ROBOT ASSISTED LAPAROSCOPIC NEPHRECTOMY Left 03/20/2016   Procedure: XI ROBOTIC ASSISTED LAPAROSCOPIC RETROPERITONEAL NEPHRECTOMY;  Surgeon: Daisy Frock, MD;  Location[facility];  Service: Urology;  Laterality: Left;  . THYROIDECTOMY N/A 08/07/2012   Procedure: THYROIDECTOMY;  Surgeon: Daisy Regal, MD;  Location: [facility];  Service: General;  Laterality: N/A;  . TRANSTHORACIC ECHOCARDIOGRAM  10-12-2014   moderate focal basal LVH,  ef 55-60%,  grade 2 diastolic dysfunction, mild AV calcification without stenosis,  mild MR,  trivial PR  Current Outpatient Prescriptions  Medication Sig Dispense Refill  . acetaminophen (TYLENOL) 500 MG tablet Take 1,000 mg by mouth daily as needed for moderate pain.    [name] amLODipine (NORVASC) 5 MG tablet Take 1 tablet (5 mg total) by mouth daily. 90 tablet 3  . denosumab (PROLIA) 60 MG/ML SOLN Inject 60 mg into the skin every 6 (six) months. Reported on 09/07/2015    . diphenhydramine-acetaminophen (TYLENOL PM) 25-500 MG TABS tablet Take 2 tablets by mouth at bedtime as needed (sleep/pain).    .  fluticasone (FLONASE) 50 MCG/ACT nasal spray Place 2 sprays into both nostrils daily as needed for allergies or rhinitis.    [name] levothyroxine (SYNTHROID, LEVOTHROID) 88 MCG tablet TAKE ONE TABLET BY MOUTH ONCE DAILY BEFORE BREAKFAST 90 tablet 3  . lisinopril-hydrochlorothiazide (ZESTORETIC) 20-12.5 MG tablet Take 2 tablets by mouth daily. 180 tablet 3  . loratadine (CLARITIN) 10 MG tablet Take 1 tablet (10 mg total) by mouth daily. (Patient taking differently: Take 10 mg by mouth daily as needed for allergies. ) 90 tablet 3  . omeprazole (PRILOSEC) 20 MG capsule Take 1 capsule (20 mg total) by mouth daily as needed (for heartburn and indigestion). 30 capsule 11  . traMADol (ULTRAM) 50 MG tablet Take 1 tablet (50 mg total) by mouth every 8 (eight) hours as needed. 30 tablet 0   No current facility-administered medications for this visit.    Facility-Administered Medications Ordered in Other Visits  Medication Dose Route Frequency Provider Last Rate Last Dose  . aminophylline injection 150 mg  150 mg Intravenous BID PRN Dorothy Spark, MD   150 mg at 04/20/15 1200    Allergies  Allergen Reactions  . Tape Rash  . Codeine Nausea And Vomiting    Review of Systems negative except from HPI and PMH  Physical Exam BP (!) 152/70   Pulse 73   Ht 5\' 3"  (1.6 m)   Wt 134 lb 3.2 oz (60.9 kg)   SpO2 98%   BMI 23.77 kg/m  Well developed and well nourished in no acute distress HENT normal E scleral and icterus clear Neck Supple; scar JVP flat; carotids brisk and full Clear to ausculation Device pocket well healed; without hematoma or erythema Regular rate and rhythm, no murmurs gallops or rub Soft with active bowel sounds No clubbing cyanosis no Edema Alert and oriented, grossly normal motor and sensory function Skin Warm and Dry  ECG demonstrates atrial fib V pacing V  Assessment and  Plan  Complete heart block   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  The information was reviewed. No changes were made in the programming.    Atrial fibrillation  Dizziness   [name] has developed atrial fibrillation and with his symptoms that are consistent with it i.e. dizziness and fatigue. We will begin her on anticoagulation. We will start her on apixoban. 5 mg twice daily. Will anticipate cardioversion next week with TEE . We have discussed issues of rhythm and rate control. Hopefully she will maintain sinus rhythm. We look forward to seeing the change in her symptom following restoration of sinus rhythm to see if we can implicate this. One of the other potential explanations for her dizziness could be a CVA. She does have an MRI compatible device.  I trust that much of her symptoms are 2/2 Afib  BP well controlled \ No intercurrent Ventricular tachycardia

## 2016-07-25 NOTE — Interval H&P Note (Signed)
History and Physical Interval Note:  07/25/2016 12:07 PM  [name]  has presented today for surgery, with the diagnosis of AFIB  The various methods of treatment have been discussed with the patient and family. After consideration of risks, benefits and other options for treatment, the patient has consented to  Procedure(s): CARDIOVERSION (N/A) TRANSESOPHAGEAL ECHOCARDIOGRAM (TEE) (N/A) as a surgical intervention .  The patient's history has been reviewed, patient examined, no change in status, stable for surgery.  I have reviewed the patient's chart and labs.  Questions were answered to the patient's satisfaction.     Dorris Carnes

## 2016-07-25 NOTE — Progress Notes (Signed)
Patient admitted to Endo unit and placed on monitor. Patient with pacemaker, paced 1:1. Medtronic rep interrogated device and showed NSR. Patient discharged to home with instructions to continue home medications and to keep follow up appointment.

## 2016-07-30 ENCOUNTER — Ambulatory Visit (HOSPITAL_BASED_OUTPATIENT_CLINIC_OR_DEPARTMENT_OTHER)
Admission: RE | Admit: 2016-07-30 | Discharge: 2016-07-30 | Disposition: A | Discharge status: Home / Self Care | Payer: Medicare Other | Source: Ambulatory Visit | Attending: Medical | Admitting: Medical

## 2016-07-30 ENCOUNTER — Encounter: Payer: Self-pay | Admitting: Medical

## 2016-07-30 ENCOUNTER — Ambulatory Visit (INDEPENDENT_AMBULATORY_CARE_PROVIDER_SITE_OTHER): Payer: Medicare Other | Admitting: Medical

## 2016-07-30 VITALS — BP 119/56 | HR 85 | Temp 97.8°F | Resp 16 | Ht 63.0 in | Wt 132.2 lb

## 2016-07-30 DIAGNOSIS — J40 Bronchitis, not specified as acute or chronic: Secondary | ICD-10-CM | POA: Diagnosis not present

## 2016-07-30 DIAGNOSIS — Z950 Presence of cardiac pacemaker: Secondary | ICD-10-CM | POA: Diagnosis not present

## 2016-07-30 DIAGNOSIS — R059 Cough, unspecified: Secondary | ICD-10-CM

## 2016-07-30 DIAGNOSIS — R918 Other nonspecific abnormal finding of lung field: Secondary | ICD-10-CM | POA: Diagnosis not present

## 2016-07-30 DIAGNOSIS — R6889 Other general symptoms and signs: Secondary | ICD-10-CM

## 2016-07-30 DIAGNOSIS — R05 Cough: Secondary | ICD-10-CM

## 2016-07-30 DIAGNOSIS — R0989 Other specified symptoms and signs involving the circulatory and respiratory systems: Secondary | ICD-10-CM | POA: Diagnosis present

## 2016-07-30 DIAGNOSIS — R5383 Other fatigue: Secondary | ICD-10-CM | POA: Diagnosis not present

## 2016-07-30 LAB — CBC WITH DIFFERENTIAL/PLATELET
BASOS PCT: 0.3 % (ref 0.0–3.0)
Basophils Absolute: 0 10*3/uL (ref 0.0–0.1)
EOS PCT: 0.3 % (ref 0.0–5.0)
Eosinophils Absolute: 0 10*3/uL (ref 0.0–0.7)
HCT: 35.8 % — ABNORMAL LOW (ref 36.0–46.0)
Hemoglobin: 11.9 g/dL — ABNORMAL LOW (ref 12.0–15.0)
LYMPHS ABS: 0.6 10*3/uL — AB (ref 0.7–4.0)
Lymphocytes Relative: 15.3 % (ref 12.0–46.0)
MCHC: 33.1 g/dL (ref 30.0–36.0)
MCV: 82.3 fl (ref 78.0–100.0)
MONO ABS: 0.7 10*3/uL (ref 0.1–1.0)
Monocytes Relative: 16.7 % — ABNORMAL HIGH (ref 3.0–12.0)
NEUTROS PCT: 67.4 % (ref 43.0–77.0)
Neutro Abs: 2.8 10*3/uL (ref 1.4–7.7)
Platelets: 126 10*3/uL — ABNORMAL LOW (ref 150.0–400.0)
RBC: 4.34 Mil/uL (ref 3.87–5.11)
RDW: 13.4 % (ref 11.5–15.5)
WBC: 4.2 10*3/uL (ref 4.0–10.5)

## 2016-07-30 LAB — POC INFLUENZA A&B (BINAX/QUICKVUE)
Influenza A, POC: NEGATIVE
Influenza B, POC: NEGATIVE

## 2016-07-30 LAB — POCT RAPID STREP A (OFFICE): RAPID STREP A SCREEN: NEGATIVE

## 2016-07-30 MED ORDER — AZITHROMYCIN 250 MG PO TABS: ORAL_TABLET | ORAL | 0 refills | Status: DC

## 2016-07-30 MED ORDER — BENZONATATE 100 MG PO CAPS: 100.0000 mg | ORAL_CAPSULE | Freq: Three times a day (TID) | ORAL | 0 refills | Status: DC | PRN

## 2016-07-30 MED FILL — AZITHROMYCIN 250 MG TABLET: 250 | 5 days supply | Qty: 6 | Fill #0

## 2016-07-30 MED FILL — BENZONATATE 100 MG CAP: 100 | 7 days supply | Qty: 21 | Fill #0

## 2016-07-30 NOTE — Patient Instructions (Addendum)
Your flu test and rapid strep test was negative.  However you symptoms are flu like and started 4 days ago so you may now have secondary infection of bronchitis and maybe early pneumonia.  Will rx azithromycin antibiotic and benzonatate for cough.  For laryngitis please rest your voice and keep throat moist.  Please get cbc and cxr today.  See cardiologist in February.  Follow up in 7 days or as needed

## 2016-07-30 NOTE — Progress Notes (Signed)
Subjective:    Patient ID: [name], female    DOB: 16-Nov-1940, 76 y.o.   MRN: [id number]  HPI   Pt in for follow up from some atrial fibrillation. She was admitted for possible cardioversion. But in hospital she converted and did not have to go through the procedure. Pt has follow up with cardiologist PA on 16 February. Pt reports on episodes of palpitations. Pt does have a pacemaker.  Pt was dc on 25th of January.  On the 27 th she developed head congestion, chest congestion sinus pressure, ear pain, st, hoarse voice and productive cough.   Pt has fatigue, chills and body aches for 4 days.       Review of Systems  Constitutional: Negative for chills, fatigue and fever.  HENT: Positive for congestion, ear pain, postnasal drip, sinus pain, sinus pressure, sore throat and voice change.   Respiratory: Negative for cough, chest tightness, shortness of breath and wheezing.   Cardiovascular: Negative for chest pain and palpitations.  Gastrointestinal: Negative for abdominal pain.  Musculoskeletal: Positive for myalgias. Negative for back pain.  Skin: Negative for rash.  Neurological: Negative for dizziness, weakness and numbness.  Hematological: Negative for adenopathy. Does not bruise/bleed easily.  Psychiatric/Behavioral: Negative for behavioral problems and confusion.    Past Medical History:  Diagnosis Date  . Atrioventricular block, complete ([place])    a. 08/2011 Upgrage of PPM to MDT Adapta L Dual Chamber PPM ser # [id number] H.  . Cardiac pacemaker in situ   . Contact dermatitis and eczema    unspec cause  . Dairy product intolerance   . Depression   . Diastolic dysfunction   . GERD (gastroesophageal reflux disease)   . Hiatal hernia   . History of CHF (congestive heart failure)   . History of diverticulitis of colon    2003  perforated diverticulitis w/ surgical intervention  . History of kidney stones    age 55  . History of peripheral edema    lower  extremities  . History of thyroid nodule    multinodular goiter  s/p  total thyroidectomy 2014  . HTN (hypertension)   . Hydronephrosis, left   . Hypothyroidism   . Mild sleep apnea    per study 04-14-2014  . Nocturia   . Osteoporosis   . PAF (paroxysmal atrial fibrillation) ([place])   . PONV (postoperative nausea and vomiting)   . Pulmonary nodules    stable per last ct  . Renal cell carcinoma of left kidney (HCC)   . Shortness of breath    intermittant  . Superficial thrombophlebitis      Social History   Social History  . Marital status: Single    Spouse name: N/A  . Number of children: 2  . Years of education: 16   Occupational History  . Retired Production assistant, radio for the [employer]   .  Retired   Social History Main Topics  . Smoking status: Former Smoker    Years: 15.00    Types: Cigarettes    Quit date: 10/06/1979  . Smokeless tobacco: Never Used  . Alcohol use No  . Drug use: No  . Sexual activity: Not on file   Other Topics Concern  . Not on file   Social History Narrative   The patient is a Writer of [employer].  She worked for the [employer] in [employer].  She was married for 22 years, divorced, and has remained single.  She has 1 grown son, 1 grown daughter.  She has 3 grandchildren, 2 of her children   live in [place].  She is retired.  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    Past Surgical History:  Procedure Laterality Date  . CARDIOVASCULAR STRESS TEST  04-20-2015  dr Caryl Comes   normal nuclear study/  no ischemia/  normal LV function and wall motion, ef 74%  . COLONOSCOPY  01/19/2013  . COLOSTOMY TAKEDOWN  07-22-2002  . CYSTOSCOPY WITH RETROGRADE PYELOGRAM, URETEROSCOPY AND STENT PLACEMENT Left 07/28/2015   Procedure: CYSTOSCOPY WITH LEFT RETROGRADE PYELOGRAM, POSSIBLE LEFT URETEROSCOPY AND STENT PLACEMENT;  Surgeon: Franchot Gallo, MD;  Location: [facility];  Service: Urology;  Laterality: Left;  . EXPLORATORY LAPARTOMY SIGMOID COLECTOMY/ COLOSTOMY  04-28-2002   perforated diverticulitis  . LAPAROSCOPY VENTRAL HERNIA REPAIR/  EXTENSIVE LYSIS ADHESIONS  05-04-2004  . PACEMAKER GENERATOR CHANGE Left 09/16/2011   MDT [place] pacemaker  . PACEMAKER PLACEMENT  05-11-2003     medtronic  . ROBOT ASSISTED LAPAROSCOPIC NEPHRECTOMY Left 03/20/2016   Procedure: XI ROBOTIC ASSISTED LAPAROSCOPIC RETROPERITONEAL NEPHRECTOMY;  Surgeon: Alexis Frock, MD;  [facility];  Service: Urology;  Laterality: Left;  . THYROIDECTOMY N/A 08/07/2012   Procedure: THYROIDECTOMY;  Surgeon: Earnstine Regal, MD;  Location: [facility];  Service: General;  Laterality: N/A;  . TRANSTHORACIC ECHOCARDIOGRAM  10-12-2014   moderate focal basal LVH,  ef 55-60%,  grade 2 diastolic dysfunction, mild AV calcification without stenosis,  mild MR,  trivial PR    Family History  Problem Relation Age of Onset  . Heart disease Mother   . Stroke Mother   . Hypertension Mother   . Heart failure Father   . Hypertension Brother   . Heart disease Brother   . Diverticulosis Sister   . Hypertension Brother   . Cancer Son     breast  . Heart attack Neg Hx     Allergies  Allergen Reactions  . Tape Rash  . Codeine Nausea And Vomiting    Current Outpatient Prescriptions on File Prior to Visit  Medication Sig Dispense Refill  . acetaminophen (TYLENOL) 500 MG tablet Take 1,000 mg by mouth daily as needed for moderate pain.    [name] amLODipine (NORVASC) 5 MG tablet Take 1 tablet (5 mg total) by mouth daily. 90 tablet 3  . apixaban (ELIQUIS) 5 MG TABS tablet Take 1 tablet (5 mg total) by mouth 2 (two) times daily.    [name] denosumab (PROLIA) 60 MG/ML SOLN Inject 60 mg into the skin every 6 (six) months. Reported on 09/07/2015    . diphenhydramine-acetaminophen (TYLENOL PM) 25-500 MG TABS tablet Take 2 tablets by mouth at bedtime as needed (sleep/pain).     . fluticasone (FLONASE) 50 MCG/ACT nasal spray Place 2 sprays into both nostrils daily as needed for allergies or rhinitis.    [name] levothyroxine (SYNTHROID, LEVOTHROID) 88 MCG tablet TAKE ONE TABLET BY MOUTH ONCE DAILY BEFORE BREAKFAST 90 tablet 3  . lisinopril-hydrochlorothiazide (ZESTORETIC) 20-12.5 MG tablet Take 2 tablets by mouth daily. 180 tablet 3  . loratadine (CLARITIN) 10 MG tablet Take 1 tablet (10 mg total) by mouth daily. (Patient taking differently: Take 10 mg by mouth daily as needed for allergies. ) 90 tablet 3  . omeprazole (PRILOSEC) 20 MG capsule Take 1 capsule (20 mg total) by mouth daily as needed (for heartburn and  indigestion). 30 capsule 11  . traMADol (ULTRAM) 50 MG tablet Take 1 tablet (50 mg total) by mouth every 8 (eight) hours as needed. 30 tablet 0  . [DISCONTINUED] famotidine (PEPCID) 20 MG tablet Take 20 mg by mouth as needed.      Current Facility-Administered Medications on File Prior to Visit  Medication Dose Route Frequency Provider Last Rate Last Dose  . aminophylline injection 150 mg  150 mg Intravenous BID PRN Dorothy Spark, MD   150 mg at 04/20/15 1200    BP (!) 119/56 (BP Location: Right Arm, Patient Position: Sitting, Cuff Size: Normal)   Pulse 85   Temp 97.8 F (36.6 C) (Oral)   Resp 16   Ht 5\' 3"  (1.6 m)   Wt 132 lb 4 oz (60 kg)   SpO2 99%   BMI 23.43 kg/m       Objective:   Physical Exam   General  Mental Status - Alert. General Appearance - Well groomed. Not in acute distress.  Skin Rashes- No Rashes.  HEENT Head- Normal. Ear Auditory Canal - Left- Normal. Right - Normal.Tympanic Membrane- Left- Normal. Right- Normal. Eye Sclera/Conjunctiva- Left- Normal. Right- Normal. Nose & Sinuses Nasal Mucosa- Left-  Boggy and Congested. Right-  Boggy and  Congested.Bilateral maxillary and frontal sinus pressure. Mouth & Throat Lips: Upper Lip- Normal: no dryness, cracking, pallor, cyanosis, or vesicular eruption. Lower Lip-Normal: no  dryness, cracking, pallor, cyanosis or vesicular eruption. Buccal Mucosa- Bilateral- No Aphthous ulcers. Oropharynx- No Discharge or Erythema. Tonsils: Characteristics- Bilateral- No Erythema or Congestion. Size/Enlargement- Bilateral- No enlargement. Discharge- bilateral-None.  Neck Neck- Supple. No Masses.   Chest and Lung Exam Auscultation: Breath Sounds:-Clear even and unlabored.  Cardiovascular Auscultation:Rythm- Regular, rate and rhythm. Murmurs & Other Heart Sounds:Ausculatation of the heart reveal- No Murmurs.  Lymphatic Head & Neck General Head & Neck Lymphatics: Bilateral: Description- No Localized lymphadenopathy.      Assessment & Plan:  Your flu test and rapid strep test was negative.  However you symptoms are flu like and started 4 days ago so you may now have secondary infection of bronchitis and maybe early pneumonia.  Will rx azithromycin antibiotic and benzonatate for cough.  For laryngitis please rest your voice and keep throat moist.  Please get cbc and cxr today.  See cardiologist in February.  Follow up in 7 days or as needed

## 2016-07-30 NOTE — Progress Notes (Signed)
poct Pre visit review using our clinic review tool, if applicable. No additional management support is needed unless otherwise documented below in the visit note/SLS

## 2016-07-31 ENCOUNTER — Telehealth: Payer: Self-pay | Admitting: Family Medicine

## 2016-07-31 LAB — CULTURE, GROUP A STREP: Organism ID, Bacteria: NORMAL

## 2016-07-31 MED ORDER — LEVOCETIRIZINE DIHYDROCHLORIDE 5 MG PO TABS: 5.0000 mg | ORAL_TABLET | Freq: Every evening | ORAL | 0 refills | Status: DC

## 2016-07-31 NOTE — Telephone Encounter (Signed)
Spoke to the patient and looks like Dr. Etter Sjogren approved for her to go ahead and get the prolia on 06/07/2016--found on a mychart message dated 07/19/2016. Provider states labs done and ok to do. But evidently was never messaged to you to do PA. So please do and let us know the results and then can call the patient. Please initiate

## 2016-07-31 NOTE — Telephone Encounter (Signed)
Pt called in returning cma call for lab results.   ALSO, pt would like to know if it is okay to go ahead and schedule her prolia injection. Informed pt that per PCP it's okay. Just would like to be sure that its okay to go ahead and schedule pt.

## 2016-07-31 NOTE — Telephone Encounter (Signed)
rx xyzal sent to pharmacy.

## 2016-08-01 ENCOUNTER — Other Ambulatory Visit: Payer: Self-pay | Admitting: Family Medicine

## 2016-08-01 MED ORDER — LEVOCETIRIZINE DIHYDROCHLORIDE 5 MG PO TABS: 5.0000 mg | ORAL_TABLET | Freq: Every evening | ORAL | 0 refills | Status: DC

## 2016-08-01 MED FILL — LEVOCETIRIZINE 5 MG TABLET: 5 | 30 days supply | Qty: 30 | Fill #0

## 2016-08-02 NOTE — Telephone Encounter (Signed)
Submitted request for Prolia benefits

## 2016-08-06 ENCOUNTER — Ambulatory Visit (INDEPENDENT_AMBULATORY_CARE_PROVIDER_SITE_OTHER): Payer: Medicare Other | Admitting: Medical

## 2016-08-06 VITALS — BP 117/56 | HR 72 | Temp 97.7°F | Ht 63.0 in | Wt 132.4 lb

## 2016-08-06 DIAGNOSIS — R0981 Nasal congestion: Secondary | ICD-10-CM | POA: Diagnosis not present

## 2016-08-06 DIAGNOSIS — J3489 Other specified disorders of nose and nasal sinuses: Secondary | ICD-10-CM

## 2016-08-06 MED ORDER — FLUTICASONE PROPIONATE 50 MCG/ACT NA SUSP: 2.0000 | Freq: Every day | NASAL | 1 refills | Status: DC

## 2016-08-06 MED ORDER — METHYLPREDNISOLONE ACETATE 40 MG/ML IJ SUSP
40.0000 mg | Freq: Once | INTRAMUSCULAR | Status: AC
  Administered 2016-08-06: 40 mg via INTRAMUSCULAR

## 2016-08-06 NOTE — Patient Instructions (Addendum)
You have severe nasal congestion post flu and secondary sinus infection. I don't think you have sinus infection any more(just finished zpack)  For nasal congestion we gave depomedrol 40 mg im and will rx flonase nasal spray.   I don't think you need second round antibiotic at this time  but update me on Friday am by my chart. If sinus pressure persists would consider rx doxycycline.  Please stay hydrated.  Follow up in 7-10 days or as needed  I think you energy level will get back to normal soon. Maybe another week at most.

## 2016-08-06 NOTE — Progress Notes (Signed)
Subjective:    Patient ID: [name], female    DOB: [birth year], 76 y.o.   MRN: [id number]  HPI  Pt in states she feels better about 70% better. She has some frontal sinus region pain(very nasal congested). She has pnd but was worse.   In the morning will cough up mucous up until about 1 pm in afternoon.   Pt had flu like syndrome but presented 4 days after onset. Treated for sinus infection(secondary infection). Pt voice started to come back on Sunday. Was very hoarse last visit.  Pt blood work looked good. No wbc elevation and cxr showed no pneumonia.    Review of Systems  Constitutional: Negative for chills, fatigue and fever.  HENT: Positive for congestion and postnasal drip. Negative for ear pain, hearing loss, mouth sores and nosebleeds.   Respiratory: Negative for cough, chest tightness, shortness of breath and wheezing.   Cardiovascular: Negative for chest pain and palpitations.  Gastrointestinal: Negative for abdominal pain, nausea and vomiting.  Musculoskeletal: Negative for back pain.  Neurological: Negative for dizziness, weakness, numbness and headaches.  Hematological: Negative for adenopathy. Does not bruise/bleed easily.    Past Medical History:  Diagnosis Date  . Atrioventricular block, complete ([place])    a. 08/2011 Upgrage of PPM to MDT Adapta L Dual Chamber PPM ser # [id number].  . Cardiac pacemaker in situ   . Contact dermatitis and eczema    unspec cause  . Dairy product intolerance   . Depression   . Diastolic dysfunction   . GERD (gastroesophageal reflux disease)   . Hiatal hernia   . History of CHF (congestive heart failure)   . History of diverticulitis of colon    2003  perforated diverticulitis w/ surgical intervention  . History of kidney stones    age 19  . History of peripheral edema    lower extremities  . History of thyroid nodule    multinodular goiter  s/p  total thyroidectomy 2014  . HTN (hypertension)   . Hydronephrosis, left     . Hypothyroidism   . Mild sleep apnea    per study 04-14-2014  . Nocturia   . Osteoporosis   . PAF (paroxysmal atrial fibrillation) ([place])   . PONV (postoperative nausea and vomiting)   . Pulmonary nodules    stable per last ct  . Renal cell carcinoma of left kidney (HCC)   . Shortness of breath    intermittant  . Superficial thrombophlebitis      Social History   Social History  . Marital status: Single    Spouse name: N/A  . Number of children: 2  . Years of education: 16   Occupational History  . Retired Production assistant, radio for the [employer]   .  Retired   Social History Main Topics  . Smoking status: Former Smoker    Years: 15.00    Types: Cigarettes    Quit date: 10/06/1979  . Smokeless tobacco: Never Used  . Alcohol use No  . Drug use: No  . Sexual activity: Not on file   Other Topics Concern  . Not on file   Social History Narrative   The patient is a Writer of [employer].  She worked for the [employer] in [employer].  She was married for 22 years, divorced, and has remained single. She has 1 grown son, 1 grown daughter.  She has 3 grandchildren, 2 of her children   live in  [place].  She is retired.  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    Past Surgical History:  Procedure Laterality Date  . CARDIOVASCULAR STRESS TEST  04-20-2015  dr Caryl Comes   normal nuclear study/  no ischemia/  normal LV function and wall motion, ef 74%  . COLONOSCOPY  01/19/2013  . COLOSTOMY TAKEDOWN  07-22-2002  . CYSTOSCOPY WITH RETROGRADE PYELOGRAM, URETEROSCOPY AND STENT PLACEMENT Left 07/28/2015   Procedure: CYSTOSCOPY WITH LEFT RETROGRADE PYELOGRAM, POSSIBLE LEFT URETEROSCOPY AND STENT PLACEMENT;  Surgeon: Franchot Gallo, MD;  [facility];  Service: Urology;  Laterality: Left;  . EXPLORATORY LAPARTOMY SIGMOID COLECTOMY/ COLOSTOMY   04-28-2002   perforated diverticulitis  . LAPAROSCOPY VENTRAL HERNIA REPAIR/  EXTENSIVE LYSIS ADHESIONS  05-04-2004  . PACEMAKER GENERATOR CHANGE Left 09/16/2011   MDT [place] pacemaker  . PACEMAKER PLACEMENT  05-11-2003     medtronic  . ROBOT ASSISTED LAPAROSCOPIC NEPHRECTOMY Left 03/20/2016   Procedure: XI ROBOTIC ASSISTED LAPAROSCOPIC RETROPERITONEAL NEPHRECTOMY;  Surgeon: Alexis Frock, MD;  Location: [facility];  Service: Urology;  Laterality: Left;  . THYROIDECTOMY N/A 08/07/2012   Procedure: THYROIDECTOMY;  Surgeon: Earnstine Regal, MD;  Location: [facility];  Service: General;  Laterality: N/A;  . TRANSTHORACIC ECHOCARDIOGRAM  10-12-2014   moderate focal basal LVH,  ef 55-60%,  grade 2 diastolic dysfunction, mild AV calcification without stenosis,  mild MR,  trivial PR    Family History  Problem Relation Age of Onset  . Heart disease Mother   . Stroke Mother   . Hypertension Mother   . Heart failure Father   . Hypertension Brother   . Heart disease Brother   . Diverticulosis Sister   . Hypertension Brother   . Cancer Son     breast  . Heart attack Neg Hx     Allergies  Allergen Reactions  . Tape Rash  . Codeine Nausea And Vomiting    Current Outpatient Prescriptions on File Prior to Visit  Medication Sig Dispense Refill  . acetaminophen (TYLENOL) 500 MG tablet Take 1,000 mg by mouth daily as needed for moderate pain.    [name] amLODipine (NORVASC) 5 MG tablet Take 1 tablet (5 mg total) by mouth daily. 90 tablet 3  . apixaban (ELIQUIS) 5 MG TABS tablet Take 1 tablet (5 mg total) by mouth 2 (two) times daily.    [name] denosumab (PROLIA) 60 MG/ML SOLN Inject 60 mg into the skin every 6 (six) months. Reported on 09/07/2015    . diphenhydramine-acetaminophen (TYLENOL PM) 25-500 MG TABS tablet Take 2 tablets by mouth at bedtime as needed (sleep/pain).    . fluticasone (FLONASE) 50 MCG/ACT nasal spray Place 2 sprays into both nostrils daily as needed for allergies or rhinitis.    [name] levocetirizine  (XYZAL) 5 MG tablet Take 1 tablet (5 mg total) by mouth every evening. 30 tablet 0  . levothyroxine (SYNTHROID, LEVOTHROID) 88 MCG tablet TAKE ONE TABLET BY MOUTH ONCE DAILY BEFORE BREAKFAST 90 tablet 3  . lisinopril-hydrochlorothiazide (ZESTORETIC) 20-12.5 MG tablet Take 2 tablets by mouth daily. 180 tablet 3  . loratadine (CLARITIN) 10 MG tablet Take 1 tablet (10 mg total) by mouth daily. (Patient taking differently: Take 10 mg by mouth daily as needed for allergies. ) 90 tablet 3  . omeprazole (PRILOSEC) 20 MG capsule Take 1 capsule (20 mg total) by mouth daily as needed (for heartburn and indigestion).  30 capsule 11  . traMADol (ULTRAM) 50 MG tablet Take 1 tablet (50 mg total) by mouth every 8 (eight) hours as needed. 30 tablet 0  . [DISCONTINUED] famotidine (PEPCID) 20 MG tablet Take 20 mg by mouth as needed.      Current Facility-Administered Medications on File Prior to Visit  Medication Dose Route Frequency Provider Last Rate Last Dose  . aminophylline injection 150 mg  150 mg Intravenous BID PRN Dorothy Spark, MD   150 mg at 04/20/15 1200    BP (!) 117/56   Pulse 72   Temp 97.7 F (36.5 C) (Oral)   Ht 5\' 3"  (1.6 m)   Wt 132 lb 6.4 oz (60.1 kg)   SpO2 100%   BMI 23.45 kg/m       Objective:   Physical Exam   General  Mental Status - Alert. General Appearance - Well groomed. Not in acute distress.  Skin Rashes- No Rashes.  HEENT Head- Normal. Ear Auditory Canal - Left- Normal. Right - Normal.Tympanic Membrane- Left- Normal. Right- Normal. Eye Sclera/Conjunctiva- Left- Normal. Right- Normal. Nose & Sinuses Nasal Mucosa- Left-  Boggy and Congested. Right-  Boggy and  Congested.Bilateral no  maxillary and no  frontal sinus pressure. Mouth & Throat Lips: Upper Lip- Normal: no dryness, cracking, pallor, cyanosis, or vesicular eruption. Lower Lip-Normal: no dryness, cracking, pallor, cyanosis or vesicular eruption. Buccal Mucosa- Bilateral- No Aphthous  ulcers. Oropharynx- No Discharge or Erythema.  Tonsils: Characteristics- Bilateral- No Erythema or Congestion. Size/Enlargement- Bilateral- No enlargement. Discharge- bilateral-None. +pnd  Neck Neck- Supple. No Masses.   Chest and Lung Exam Auscultation: Breath Sounds:-Clear even and unlabored.  Cardiovascular Auscultation:Rythm- Regular, rate and rhythm. Murmurs & Other Heart Sounds:Ausculatation of the heart reveal- No Murmurs.  Lymphatic Head & Neck General Head & Neck Lymphatics: Bilateral: Description- No Localized lymphadenopathy.      Assessment & Plan:  You have severe nasal congestion post flu and secondary sinus infection. I don't think you have sinus infection any more(just finished zpack)  For nasal congestion we gave depomedrol 40 mg im and will rx flonase nasal spray.   I don't think you need second round antibiotic at this time  but update me on Friday am by my chart. If sinus pressure persists would consider rx doxycycline.  Please stay hydrated.  Follow up in 7-10 days or as needed  I think you energy level will get back to normal soon. Maybe another week at most.  Jazlene Bares, [place]ercell Miller, [place]

## 2016-08-06 NOTE — Progress Notes (Signed)
Pre visit review using our clinic review tool, if applicable. No additional management support is needed unless otherwise documented below in the visit note. 

## 2016-08-12 ENCOUNTER — Other Ambulatory Visit: Payer: Self-pay | Admitting: Family Medicine

## 2016-08-12 ENCOUNTER — Telehealth: Payer: Self-pay | Admitting: Family Medicine

## 2016-08-12 DIAGNOSIS — E2839 Other primary ovarian failure: Secondary | ICD-10-CM

## 2016-08-12 NOTE — Telephone Encounter (Signed)
Spoke to the patient informed of her cost. She is ok to have, but last Bone Density was in 2016--her question should that be done first? Also does she need any updated lab work completed before getting prolia??  Let me know she is good to have done, but have those questions to be answered before ordering for her. Thanks,

## 2016-08-12 NOTE — Telephone Encounter (Signed)
Yes bone density needs to be done and cmp

## 2016-08-12 NOTE — Telephone Encounter (Signed)
Called the patient informed of lab/bone density order she agreed to do Put order in for CMP/put in referral/order for Bone density

## 2016-08-12 NOTE — Progress Notes (Signed)
Cardiology Office Note Date:  08/13/2016  Patient ID:  Daisy, [name] 11-24-1940, MRN [id number] PCP:  [name], DO  Cardiologist:  Dr. Caryl Comes   Chief Complaint: planned f/u  History of Present Illness: [name] is a 76 y.o. female with history of CHB w/PPM, HTN, hypothyroid s/p thyroidectomy, renal cancer/nephrectomy (left), comes to the office to be seen for Dr. Caryl Comes.  She last saw him 07/16/16, feeling poorly in newly noted AFib, she was started on Eliquis and planned for TEE/DCCV which was scheduled though on arrival to the endo department she was noted to be in [place].  A few days later she was seen by her PMD service for cough/cold subjective febrile illness flu test negative and tx with ZPak  This has definitely settled into her sinuses and is f/u with her PMD, she continues to have significant sinus congestion and post nasal gtt with throat irritation.  No chest congestion, no c/o SOB, no CP, palpitations, no near syncope or syncope.    She has no medication coverage and Eliquis will be unaffordable for her.  She denies any bleeding or signs of bleeding and is tolerating it well.  07/30/16: H/H11/35, plts 126 (down some) 06/07/16: Creat 1.15  Device information: MDT dual chamber PPM, implanted 2004, gen change 09/16/11, Dr. Lovena Le, CHB Dr. Olin Pia note mentions Ventricular failure to pace/polarity switch DEVICE DEPENDEDNT   Past Medical History:  Diagnosis Date  . Atrioventricular block, complete ([place])    a. 08/2011 Upgrage of PPM to MDT Adapta L Dual Chamber PPM ser # [id number] H.  . Cardiac pacemaker in situ   . Contact dermatitis and eczema    unspec cause  . Dairy product intolerance   . Depression   . Diastolic dysfunction   . GERD (gastroesophageal reflux disease)   . Hiatal hernia   . History of CHF (congestive heart failure)   . History of diverticulitis of colon    2003  perforated diverticulitis w/ surgical intervention  . History of kidney  stones    age 31  . History of peripheral edema    lower extremities  . History of thyroid nodule    multinodular goiter  s/p  total thyroidectomy 2014  . HTN (hypertension)   . Hydronephrosis, left   . Hypothyroidism   . Mild sleep apnea    per study 04-14-2014  . Nocturia   . Osteoporosis   . PAF (paroxysmal atrial fibrillation) ([place])   . PONV (postoperative nausea and vomiting)   . Pulmonary nodules    stable per last ct  . Renal cell carcinoma of left kidney (HCC)   . Shortness of breath    intermittant  . Superficial thrombophlebitis     Past Surgical History:  Procedure Laterality Date  . CARDIOVASCULAR STRESS TEST  04-20-2015  dr Caryl Comes   normal nuclear study/  no ischemia/  normal LV function and wall motion, ef 74%  . COLONOSCOPY  01/19/2013  . COLOSTOMY TAKEDOWN  07-22-2002  . CYSTOSCOPY WITH RETROGRADE PYELOGRAM, URETEROSCOPY AND STENT PLACEMENT Left 07/28/2015   Procedure: CYSTOSCOPY WITH LEFT RETROGRADE PYELOGRAM, POSSIBLE LEFT URETEROSCOPY AND STENT PLACEMENT;  Surgeon: Franchot Gallo, MD;  Location: [facility];  Service: Urology;  Laterality: Left;  . EXPLORATORY LAPARTOMY SIGMOID COLECTOMY/ COLOSTOMY  04-28-2002   perforated diverticulitis  . LAPAROSCOPY VENTRAL HERNIA REPAIR/  EXTENSIVE LYSIS ADHESIONS  05-04-2004  . PACEMAKER GENERATOR CHANGE Left 09/16/2011   MDT [place] pacemaker  . PACEMAKER PLACEMENT  05-11-2003     medtronic  . ROBOT ASSISTED LAPAROSCOPIC NEPHRECTOMY Left 03/20/2016   Procedure: XI ROBOTIC ASSISTED LAPAROSCOPIC RETROPERITONEAL NEPHRECTOMY;  Surgeon: Alexis Frock, MD;  Location: [facility];  Service: Urology;  Laterality: Left;  . THYROIDECTOMY N/A 08/07/2012   Procedure: THYROIDECTOMY;  Surgeon: Earnstine Regal, MD;  Location: [facility];  Service: General;  Laterality: N/A;  . TRANSTHORACIC ECHOCARDIOGRAM  10-12-2014   moderate focal basal LVH,  ef 55-60%,  grade 2 diastolic dysfunction, mild AV calcification without stenosis,   mild MR,  trivial PR    Current Outpatient Prescriptions  Medication Sig Dispense Refill  . acetaminophen (TYLENOL) 500 MG tablet Take 1,000 mg by mouth daily as needed for moderate pain.    [name] amLODipine (NORVASC) 5 MG tablet Take 1 tablet (5 mg total) by mouth daily. 90 tablet 3  . apixaban (ELIQUIS) 5 MG TABS tablet Take 1 tablet (5 mg total) by mouth 2 (two) times daily.    [name] denosumab (PROLIA) 60 MG/ML SOLN Inject 60 mg into the skin every 6 (six) months. Reported on 09/07/2015    . diphenhydramine-acetaminophen (TYLENOL PM) 25-500 MG TABS tablet Take 2 tablets by mouth at bedtime as needed (sleep/pain).    . fluticasone (FLONASE) 50 MCG/ACT nasal spray Place 2 sprays into both nostrils daily as needed for allergies or rhinitis.    [name] levocetirizine (XYZAL) 5 MG tablet Take 1 tablet (5 mg total) by mouth every evening. 30 tablet 0  . levothyroxine (SYNTHROID, LEVOTHROID) 88 MCG tablet TAKE ONE TABLET BY MOUTH ONCE DAILY BEFORE BREAKFAST 90 tablet 3  . lisinopril-hydrochlorothiazide (ZESTORETIC) 20-12.5 MG tablet Take 2 tablets by mouth daily. 180 tablet 3  . loratadine (CLARITIN) 10 MG tablet Take 1 tablet (10 mg total) by mouth daily. (Patient taking differently: Take 10 mg by mouth daily as needed for allergies. ) 90 tablet 3  . omeprazole (PRILOSEC) 20 MG capsule Take 1 capsule (20 mg total) by mouth daily as needed (for heartburn and indigestion). 30 capsule 11  . traMADol (ULTRAM) 50 MG tablet Take 1 tablet (50 mg total) by mouth every 8 (eight) hours as needed. 30 tablet 0   No current facility-administered medications for this visit.    Facility-Administered Medications Ordered in Other Visits  Medication Dose Route Frequency Provider Last Rate Last Dose  . aminophylline injection 150 mg  150 mg Intravenous BID PRN Dorothy Spark, MD   150 mg at 04/20/15 1200    Allergies:   Tape and Codeine   Social History:  The patient  reports that she quit smoking about 36 years ago. Her  smoking use included Cigarettes. She quit after 15.00 years of use. She has never used smokeless tobacco. She reports that she does not drink alcohol or use drugs.   Family History:  The patient's family history includes Cancer in her son; Diverticulosis in her sister; Heart disease in her brother and mother; Heart failure in her father; Hypertension in her brother, brother, and mother; Stroke in her mother.  ROS:  Please see the history of present illness.  All other systems are reviewed and otherwise negative.   PHYSICAL EXAM:  VS:  BP (!) 154/84   Pulse 88   Ht 5\' 3"  (1.6 m)   Wt 133 lb (60.3 kg)   BMI 23.56 kg/m  BMI: Body mass index is 23.56 kg/m. Well nourished, well developed, in no acute distress  HEENT: normocephalic, atraumatic  Neck: no JVD, carotid bruits or masses Cardiac:  RRR; no significant murmurs, no rubs, or gallops Lungs:  CTA b/l, no wheezing, rhonchi or rales  Abd: soft, nontender MS: no deformity or atrophy Ext: no edema  Skin: warm and dry, no rash Neuro:  No gross deficits appreciated Psych: euthymic mood, full affect  PPM site is stable, no tethering or discomfort   EKG:  Done 07/16/16: SR, V paced PPM interrogation done today and reviewed by myself: SR today, last EF noted 07/29/16, stable battery and lead measurements  10/12/14: TTE Study Conclusions - Left ventricle: The cavity size was normal. There was moderate focal basal hypertrophy. Systolic function was normal. The estimated ejection fraction was in the range of 55% to 60%. Wall motion was normal; there were no regional wall motion abnormalities. Features are consistent with a pseudonormal left ventricular filling pattern, with concomitant abnormal relaxation and increased filling pressure (grade 2 diastolic dysfunction). - Aortic valve: Mildly calcified annulus. Trileaflet. Thickening. Transvalvular velocity was within the normal range. There was no stenosis. There was no  regurgitation. - Mitral valve: Transvalvular velocity was within the normal range. There was no evidence for stenosis. There was mild regurgitation. - Pulmonic valve: There was trivial regurgitation.  10/16 Myoview normal LV function and no ischemia   Recent Labs: 09/07/2015: TSH 1.38 06/07/2016: ALT 13; BUN 27; Creatinine, Ser 1.15; Potassium 4.2; Sodium 136 07/30/2016: Hemoglobin 11.9; Platelets 126.0  06/07/2016: Cholesterol 188; HDL 53.40; LDL Cholesterol 118; Total CHOL/HDL Ratio 4; Triglycerides 79.0; VLDL 15.8   CrCl cannot be calculated (Patient's most recent lab result is older than the maximum 21 days allowed.).   Wt Readings from Last 3 Encounters:  08/13/16 133 lb (60.3 kg)  08/06/16 132 lb 6.4 oz (60.1 kg)  07/30/16 132 lb 4 oz (60 kg)     Other studies reviewed: Additional studies/records reviewed today include: summarized above  ASSESSMENT AND PLAN:  1. CHB w/PPM     stable device function  2. New Paroxysmal Afib     CHA2DS2Vasc is 4, on Eliquis     Discussed warfarin as a more cost comfortable alternative to Eliquis (or nothing), she would really like to try and avoid warfarin, she is given 30 day card and patient assistance paperwork, she will not have Rx coverage until jan 2019.  We will try to get her by with samples as we can as well, she is encouraged to reach out to her PMD if they can help her as well.     She understands the importance of a/c with the AF  3. HTN      High here today though she reports generally 130's at home.   Disposition: F/u with 3 month remote, Dr. Caryl Comes in 12 months, sooner if needed.  She will let us know about her Eliquis/ability to get and if needed will have to discuss alternative/warfarin.  Current medicines are reviewed at length with the patient today.  The patient did not have any concerns regarding medicines.  Haywood Lasso, PA-C 08/13/2016 1:37 PM     [address] [place]  [place] [address] [phone] (office)  [phone] (fax)

## 2016-08-12 NOTE — Telephone Encounter (Signed)
prolia benefits verified No PA required Patient has dual coverage and may be subject to 20% co-insurance   Patient may owe up to $200 out of pocket depending on how much of her deductible she has met at time of service.

## 2016-08-13 ENCOUNTER — Ambulatory Visit (INDEPENDENT_AMBULATORY_CARE_PROVIDER_SITE_OTHER): Payer: Medicare Other | Admitting: Physician Assistant

## 2016-08-13 ENCOUNTER — Encounter: Payer: Self-pay | Admitting: Family Medicine

## 2016-08-13 ENCOUNTER — Ambulatory Visit (INDEPENDENT_AMBULATORY_CARE_PROVIDER_SITE_OTHER): Payer: Medicare Other | Admitting: *Deleted

## 2016-08-13 DIAGNOSIS — I442 Atrioventricular block, complete: Secondary | ICD-10-CM

## 2016-08-13 DIAGNOSIS — Z950 Presence of cardiac pacemaker: Secondary | ICD-10-CM

## 2016-08-13 DIAGNOSIS — I480 Paroxysmal atrial fibrillation: Secondary | ICD-10-CM

## 2016-08-13 DIAGNOSIS — I10 Essential (primary) hypertension: Secondary | ICD-10-CM | POA: Diagnosis not present

## 2016-08-13 LAB — CUP PACEART INCLINIC DEVICE CHECK
Battery Impedance: 373 Ohm
Battery Remaining Longevity: 87 mo
Battery Voltage: 2.78 V
Brady Statistic AP VP Percent: 1 %
Brady Statistic AP VS Percent: 0 %
Brady Statistic AS VP Percent: 99 %
Implantable Lead Implant Date: 20041110
Implantable Lead Location: 753860
Implantable Pulse Generator Implant Date: 20130318
Lead Channel Impedance Value: 374 Ohm
Lead Channel Pacing Threshold Amplitude: 0.5 V
Lead Channel Pacing Threshold Amplitude: 0.75 V
Lead Channel Pacing Threshold Amplitude: 0.875 V
Lead Channel Pacing Threshold Pulse Width: 0.4 ms
Lead Channel Pacing Threshold Pulse Width: 0.4 ms
Lead Channel Pacing Threshold Pulse Width: 0.4 ms
Lead Channel Setting Pacing Amplitude: 2 V
Lead Channel Setting Pacing Pulse Width: 0.4 ms
Lead Channel Setting Sensing Sensitivity: 4 mV
MDC IDC LEAD IMPLANT DT: 20041110
MDC IDC LEAD LOCATION: 753859
MDC IDC MSMT LEADCHNL RA IMPEDANCE VALUE: 525 Ohm
MDC IDC MSMT LEADCHNL RA PACING THRESHOLD AMPLITUDE: 0.5 V
MDC IDC MSMT LEADCHNL RA SENSING INTR AMPL: 4 mV
MDC IDC MSMT LEADCHNL RV PACING THRESHOLD PULSEWIDTH: 0.4 ms
MDC IDC SESS DTM: 20180213154212
MDC IDC SET LEADCHNL RV PACING AMPLITUDE: 2.5 V
MDC IDC STAT BRADY AS VS PERCENT: 0 %

## 2016-08-13 MED ORDER — APIXABAN 5 MG PO TABS: 5.0000 mg | ORAL_TABLET | Freq: Two times a day (BID) | ORAL | 5 refills | Status: DC

## 2016-08-13 MED FILL — ELIQUIS 5 MG TABLET: 5 | 30 days supply | Qty: 60 | Fill #0

## 2016-08-13 NOTE — Progress Notes (Signed)
Pacemaker check in clinic. Normal device function. Thresholds, sensing, impedances consistent with previous measurements. Device programmed to maximize longevity. 0.3% mode switched- all episodes of AF occurred on 07/29/16- on Eliquis. No high ventricular rates noted. Device programmed at appropriate safety margins. Histogram distribution appropriate for patient activity level. Device programmed to optimize intrinsic conduction. Estimated longevity 5.5-8.5 years. Patient enrolled in remote follow-up. [facility] 11/12/16, ROV with SK in 12 months.

## 2016-08-13 NOTE — Telephone Encounter (Signed)
We do have samples----we need to know dose ----  We don't always have it so she will need to get from Dr Caryl Comes as well

## 2016-08-13 NOTE — Addendum Note (Signed)
Addended by: Claude Manges on: 08/13/2016 02:19 PM   Modules accepted: Orders

## 2016-08-13 NOTE — Patient Instructions (Addendum)
Medication Instructions:   Your physician recommends that you continue on your current medications as directed. Please refer to the Current Medication list given to you today.   If you need a refill on your cardiac medications before your next appointment, please call your pharmacy.  Labwork: TSH TODAY   Testing/Procedures: NONE ORDERED  TODAY    Follow-Up: Your physician wants you to follow-up in: [place] will receive a reminder letter in the mail two months in advance. If you don't receive a letter, please call our office to schedule the follow-up appointment.  Remote monitoring is used to monitor your Pacemaker of ICD from home. This monitoring reduces the number of office visits required to check your device to one time per year. It allows [place] to keep an eye on the functioning of your device to ensure it is working properly. You are scheduled for a device check from home on . 5/15/2018You may send your transmission at any time that day. If you have a wireless device, the transmission will be sent automatically. After your physician reviews your transmission, you will receive a postcard with your next transmission date.      Any Other Special Instructions Will Be Listed Below (If Applicable).

## 2016-08-14 LAB — TSH: TSH: 1.77 u[IU]/mL (ref 0.450–4.500)

## 2016-08-15 ENCOUNTER — Other Ambulatory Visit (INDEPENDENT_AMBULATORY_CARE_PROVIDER_SITE_OTHER): Payer: Medicare Other

## 2016-08-15 DIAGNOSIS — E2839 Other primary ovarian failure: Secondary | ICD-10-CM | POA: Diagnosis not present

## 2016-08-15 LAB — COMPREHENSIVE METABOLIC PANEL
ALBUMIN: 4.5 g/dL (ref 3.5–5.2)
ALT: 13 U/L (ref 0–35)
AST: 21 U/L (ref 0–37)
Alkaline Phosphatase: 75 U/L (ref 39–117)
BUN: 17 mg/dL (ref 6–23)
CALCIUM: 9.7 mg/dL (ref 8.4–10.5)
CHLORIDE: 99 meq/L (ref 96–112)
CO2: 30 mEq/L (ref 19–32)
CREATININE: 1.09 mg/dL (ref 0.40–1.20)
GFR: 62.43 mL/min (ref >60.00)
Glucose, Bld: 113 mg/dL — ABNORMAL HIGH (ref 70–99)
POTASSIUM: 3.7 meq/L (ref 3.5–5.1)
Sodium: 136 mEq/L (ref 135–145)
Total Bilirubin: 0.5 mg/dL (ref 0.2–1.2)
Total Protein: 7.2 g/dL (ref 6.0–8.3)

## 2016-08-15 NOTE — Telephone Encounter (Signed)
Ok to give samples

## 2016-08-15 NOTE — Telephone Encounter (Signed)
Patient informed per PCP instructions her next bone density is not due until after 05/28/2017. Did schedule her today for her lab appt do do the cmp

## 2016-08-16 NOTE — Telephone Encounter (Signed)
PCP reviewed Labs and did ok to order prolia. Patient contacted/scheduled nurse visit appt for 08/23/2016/contacted Tricia to order prolia.

## 2016-08-16 NOTE — Telephone Encounter (Signed)
Patient came in for her CMP yesterday, if ok let me know and will order her Prolia/schedule for her. thanks

## 2016-08-20 NOTE — Telephone Encounter (Signed)
Prolia has been received and is in fridge for pt. 

## 2016-08-22 ENCOUNTER — Telehealth: Payer: Self-pay | Admitting: Internal Medicine

## 2016-08-22 ENCOUNTER — Other Ambulatory Visit: Payer: Self-pay | Admitting: *Deleted

## 2016-08-22 MED ORDER — APIXABAN 5 MG PO TABS: 5.0000 mg | ORAL_TABLET | Freq: Two times a day (BID) | ORAL | 3 refills | Status: DC

## 2016-08-22 NOTE — Telephone Encounter (Signed)
Walk In pt Form-[employer]-paper dropped off placed in [employer].

## 2016-08-23 ENCOUNTER — Ambulatory Visit: Payer: Medicare Other

## 2016-08-24 ENCOUNTER — Encounter: Payer: Self-pay | Admitting: Internal Medicine

## 2016-08-29 ENCOUNTER — Encounter: Payer: Self-pay | Admitting: Internal Medicine

## 2016-08-29 NOTE — Progress Notes (Signed)
Patient assistance paper work faxed to [employer] at [phone]. Confirmation received.

## 2016-09-04 ENCOUNTER — Telehealth: Payer: Self-pay | Admitting: *Deleted

## 2016-09-04 NOTE — Telephone Encounter (Signed)
Spoke with patient and let her know that I would place some eliquis samples at the front desk for her.

## 2016-09-16 NOTE — Telephone Encounter (Signed)
Relation to OI:NOMV Call back number: [phone] (M)   Reason for call:  Patient scheduled nurse visit for 09/17/2016

## 2016-09-17 ENCOUNTER — Ambulatory Visit (INDEPENDENT_AMBULATORY_CARE_PROVIDER_SITE_OTHER): Payer: Medicare Other

## 2016-09-17 DIAGNOSIS — M8000XA Age-related osteoporosis with current pathological fracture, unspecified site, initial encounter for fracture: Secondary | ICD-10-CM

## 2016-09-17 MED ORDER — DENOSUMAB 60 MG/ML ~~LOC~~ SOLN
60.0000 mg | Freq: Once | SUBCUTANEOUS | Status: AC
  Administered 2016-09-17: 60 mg via SUBCUTANEOUS

## 2016-09-17 NOTE — Progress Notes (Signed)
Pre visit review using our clinic tool,if applicable. No additional management support is needed unless otherwise documented below in the visit note.   Patient in for Prolia injection per order from Dr. Roma Schanz dated 07/19/16. Given 60 mg SQ right arm. Patient tolerated well. Given patient reminder card for  time frame next injection due. (September/2018)

## 2016-09-18 NOTE — Telephone Encounter (Signed)
Pt kept appt as scheduled

## 2016-10-02 ENCOUNTER — Other Ambulatory Visit: Payer: Self-pay | Admitting: Family Medicine

## 2016-10-02 DIAGNOSIS — Z1231 Encounter for screening mammogram for malignant neoplasm of breast: Secondary | ICD-10-CM

## 2016-10-03 ENCOUNTER — Encounter: Payer: Self-pay | Admitting: Family Medicine

## 2016-10-03 DIAGNOSIS — I10 Essential (primary) hypertension: Secondary | ICD-10-CM

## 2016-10-03 DIAGNOSIS — G8929 Other chronic pain: Secondary | ICD-10-CM

## 2016-10-03 DIAGNOSIS — Z889 Allergy status to unspecified drugs, medicaments and biological substances status: Secondary | ICD-10-CM

## 2016-10-03 DIAGNOSIS — M545 Low back pain: Secondary | ICD-10-CM

## 2016-10-03 MED ORDER — FLUTICASONE PROPIONATE 50 MCG/ACT NA SUSP: 2.0000 | Freq: Every day | NASAL | 6 refills | Status: DC | PRN

## 2016-10-03 MED ORDER — AMLODIPINE BESYLATE 5 MG PO TABS: 5.0000 mg | ORAL_TABLET | Freq: Every day | ORAL | 2 refills | Status: DC

## 2016-10-03 MED ORDER — LORATADINE 10 MG PO TABS: 10.0000 mg | ORAL_TABLET | Freq: Every day | ORAL | 2 refills | Status: DC | PRN

## 2016-10-03 MED ORDER — TRAMADOL HCL 50 MG PO TABS: 50.0000 mg | ORAL_TABLET | Freq: Three times a day (TID) | ORAL | 0 refills | Status: DC | PRN

## 2016-10-03 MED FILL — AMLODIPINE BESYLATE 5 MG TA: 5 | 90 days supply | Qty: 90 | Fill #0

## 2016-10-03 MED FILL — FLUTICASONE PROP 50 MCG SPR: 50 | 30 days supply | Qty: 16 | Fill #0

## 2016-10-03 MED FILL — traMADol HCL 50 MG TABS: 50 | 10 days supply | Qty: 30 | Fill #0

## 2016-10-03 MED FILL — LORATADINE 10 MG TABLET: 10 | 100 days supply | Qty: 100 | Fill #0

## 2016-10-03 NOTE — Telephone Encounter (Signed)
Faxed hardcopy for Tramadol to [employer].

## 2016-10-03 NOTE — Telephone Encounter (Signed)
Last tramadol refill 06/03/2016 Last office visit 06/03/16 No contract/uds

## 2016-10-09 ENCOUNTER — Encounter: Payer: Self-pay | Admitting: Family Medicine

## 2016-10-10 ENCOUNTER — Ambulatory Visit (HOSPITAL_BASED_OUTPATIENT_CLINIC_OR_DEPARTMENT_OTHER)
Admission: RE | Admit: 2016-10-10 | Discharge: 2016-10-10 | Disposition: A | Discharge status: Home / Self Care | Payer: Medicare Other | Source: Ambulatory Visit | Attending: Family Medicine | Admitting: Family Medicine

## 2016-10-10 ENCOUNTER — Telehealth: Payer: Self-pay | Admitting: Family Medicine

## 2016-10-10 DIAGNOSIS — Z1231 Encounter for screening mammogram for malignant neoplasm of breast: Secondary | ICD-10-CM | POA: Diagnosis not present

## 2016-10-10 NOTE — Telephone Encounter (Signed)
Patient sent mychart message to request Eliquis Samples. Gave her today 10/10/16 2--  Bottle Eliquis 5 mg  #60 each  Lot# [id number] Exp. Date dec 2020 Patient informed by phone to pickup samples at our office.

## 2016-10-15 ENCOUNTER — Encounter: Payer: Self-pay | Admitting: Internal Medicine

## 2016-10-15 ENCOUNTER — Telehealth: Payer: Self-pay | Admitting: *Deleted

## 2016-10-15 NOTE — Telephone Encounter (Signed)
Daisy Held, [name]  Family Medicine   Medication Refill , Medication Management  Reason for call   Conversation: Medication Refill  (Newest Message First)  [name], [place]        10/10/16 11:09 AM  Note    Patient sent mychart message to request Eliquis Samples. Gave her today 10/10/16 2--  Bottle Eliquis 5 mg  #60 each  Lot# [id number] Exp. Date dec 2020 Patient informed by phone to pickup samples at our office.      Pt was given 2 months worth of medication from family medicine on 10/10/16

## 2016-10-15 NOTE — Telephone Encounter (Signed)
Jeani Hawking,  Can you check on this for me- I faxed all her paperwork to [employer] on 08/29/16 with her RX. Fax confirmation received.  I haven't heard anything back.  Thanks! Nira Conn

## 2016-10-17 NOTE — Telephone Encounter (Signed)
Spoke with a rep from [employer] today. He did confirm that they have patient's application in its entirety. Unsure why it has not been processed. He will send it for review. I will notify patient of this.

## 2016-10-18 NOTE — Telephone Encounter (Signed)
**Note De-Identified [name] Obfuscation** We received a fax stating that the pts Eliquis has been approved for pt assistance through [employer].  She will receive Eliquis free of charge from 10/17/2016 until 06/30/2017.

## 2016-10-28 ENCOUNTER — Encounter: Payer: Self-pay | Admitting: Medical

## 2016-10-29 MED ORDER — LEVOCETIRIZINE DIHYDROCHLORIDE 5 MG PO TABS: 5.0000 mg | ORAL_TABLET | Freq: Every evening | ORAL | 1 refills | Status: DC

## 2016-10-29 MED FILL — LEVOCETIRIZINE 5 MG TABLET: 5 | 30 days supply | Qty: 30 | Fill #0

## 2016-11-12 ENCOUNTER — Ambulatory Visit (INDEPENDENT_AMBULATORY_CARE_PROVIDER_SITE_OTHER): Payer: Medicare Other | Admitting: *Deleted

## 2016-11-12 DIAGNOSIS — I442 Atrioventricular block, complete: Secondary | ICD-10-CM | POA: Diagnosis not present

## 2016-11-12 NOTE — Progress Notes (Signed)
Remote pacemaker transmission.   

## 2016-11-13 ENCOUNTER — Encounter: Payer: Self-pay | Admitting: Cardiology

## 2016-11-13 LAB — CUP PACEART REMOTE DEVICE CHECK
Battery Impedance: 470 Ohm
Battery Remaining Longevity: 78 mo
Battery Voltage: 2.77 V
Brady Statistic AP VP Percent: 1 %
Brady Statistic AS VP Percent: 99 %
Implantable Lead Implant Date: 20041110
Implantable Lead Location: 753860
Implantable Pulse Generator Implant Date: 20130318
Lead Channel Pacing Threshold Amplitude: 0.5 V
Lead Channel Pacing Threshold Pulse Width: 0.4 ms
Lead Channel Setting Pacing Amplitude: 2 V
Lead Channel Setting Pacing Pulse Width: 0.46 ms
MDC IDC LEAD IMPLANT DT: 20041110
MDC IDC LEAD LOCATION: 753859
MDC IDC MSMT LEADCHNL RA IMPEDANCE VALUE: 551 Ohm
MDC IDC MSMT LEADCHNL RV IMPEDANCE VALUE: 391 Ohm
MDC IDC MSMT LEADCHNL RV PACING THRESHOLD AMPLITUDE: 1.125 V
MDC IDC MSMT LEADCHNL RV PACING THRESHOLD PULSEWIDTH: 0.4 ms
MDC IDC SESS DTM: 20180515142045
MDC IDC SET LEADCHNL RV PACING AMPLITUDE: 2.5 V
MDC IDC SET LEADCHNL RV SENSING SENSITIVITY: 4 mV
MDC IDC STAT BRADY AP VS PERCENT: 0 %
MDC IDC STAT BRADY AS VS PERCENT: 0 %

## 2016-12-02 ENCOUNTER — Encounter: Payer: Self-pay | Admitting: Family Medicine

## 2016-12-02 ENCOUNTER — Ambulatory Visit (INDEPENDENT_AMBULATORY_CARE_PROVIDER_SITE_OTHER): Payer: Medicare Other | Admitting: Family Medicine

## 2016-12-02 VITALS — BP 118/62 | HR 81 | Temp 97.6°F | Resp 16 | Ht 63.0 in | Wt 140.6 lb

## 2016-12-02 DIAGNOSIS — I10 Essential (primary) hypertension: Secondary | ICD-10-CM | POA: Diagnosis not present

## 2016-12-02 LAB — COMPREHENSIVE METABOLIC PANEL
ALT: 9 U/L (ref 0–35)
AST: 21 U/L (ref 0–37)
Albumin: 4.3 g/dL (ref 3.5–5.2)
Alkaline Phosphatase: 44 U/L (ref 39–117)
BUN: 24 mg/dL — AB (ref 6–23)
CO2: 30 meq/L (ref 19–32)
CREATININE: 1.11 mg/dL (ref 0.40–1.20)
Calcium: 9.6 mg/dL (ref 8.4–10.5)
Chloride: 98 mEq/L (ref 96–112)
GFR: 61.08 mL/min (ref >60.00)
Glucose, Bld: 77 mg/dL (ref 70–99)
POTASSIUM: 3.9 meq/L (ref 3.5–5.1)
SODIUM: 134 meq/L — AB (ref 135–145)
Total Bilirubin: 0.6 mg/dL (ref 0.2–1.2)
Total Protein: 6.8 g/dL (ref 6.0–8.3)

## 2016-12-02 LAB — LIPID PANEL
CHOL/HDL RATIO: 3
CHOLESTEROL: 161 mg/dL (ref 0–200)
HDL: 46.4 mg/dL (ref >39.00)
LDL CALC: 88 mg/dL (ref 0–99)
NONHDL: 114.2
Triglycerides: 131 mg/dL (ref 0.0–149.0)
VLDL: 26.2 mg/dL (ref 0.0–40.0)

## 2016-12-02 NOTE — Progress Notes (Signed)
Patient ID: [name], female   DOB: [birth year], 76 y.o.   MRN: [id number]     Subjective:  I acted as a Education administrator for Dr. Carollee Herter.  Guerry Bruin, [place]   Patient ID: [name], female    DOB: 1940/08/23, 76 y.o.   MRN: [id number]  Chief Complaint  Patient presents with  . Hypertension    HPI  Patient is in today for follow up hypertension.  She is doing well on current treatment.  Patient Care Team: Carollee Herter, Alferd Apa, DO as PCP - General (Family Medicine) Patsey Berthold, NP as Nurse Practitioner (Cardiology) Alexis Frock, MD as Consulting Physician (Urology) Deboraha Sprang, MD as Consulting Physician (Cardiology) Ronald Lobo, MD as Consulting Physician (Gastroenterology)   Past Medical History:  Diagnosis Date  . Atrioventricular block, complete ([place])    a. 08/2011 Upgrage of PPM to MDT [place] L Dual Chamber PPM ser # [id number].  . Cardiac pacemaker in situ   . Contact dermatitis and eczema    unspec cause  . Dairy product intolerance   . Depression   . Diastolic dysfunction   . GERD (gastroesophageal reflux disease)   . Hiatal hernia   . History of CHF (congestive heart failure)   . History of diverticulitis of colon    2003  perforated diverticulitis w/ surgical intervention  . History of kidney stones    age 62  . History of peripheral edema    lower extremities  . History of thyroid nodule    multinodular goiter  s/p  total thyroidectomy 2014  . HTN (hypertension)   . Hydronephrosis, left   . Hypothyroidism   . Mild sleep apnea    per study 04-14-2014  . Nocturia   . Osteoporosis   . PAF (paroxysmal atrial fibrillation) ([place])   . PONV (postoperative nausea and vomiting)   . Pulmonary nodules    stable per last ct  . Renal cell carcinoma of left kidney ([facility])   . Shortness of breath    intermittant  . Superficial thrombophlebitis     Past Surgical History:  Procedure Laterality Date  . CARDIOVASCULAR STRESS TEST  04-20-2015  dr Caryl Comes     normal nuclear study/  no ischemia/  normal LV function and wall motion, ef 74%  . COLONOSCOPY  01/19/2013  . COLOSTOMY TAKEDOWN  07-22-2002  . CYSTOSCOPY WITH RETROGRADE PYELOGRAM, URETEROSCOPY AND STENT PLACEMENT Left 07/28/2015   Procedure: CYSTOSCOPY WITH LEFT RETROGRADE PYELOGRAM, POSSIBLE LEFT URETEROSCOPY AND STENT PLACEMENT;  Surgeon: Franchot Gallo, MD;  [facility];  Service: Urology;  Laterality: Left;  . EXPLORATORY LAPARTOMY SIGMOID COLECTOMY/ COLOSTOMY  04-28-2002   perforated diverticulitis  . LAPAROSCOPY VENTRAL HERNIA REPAIR/  EXTENSIVE LYSIS ADHESIONS  05-04-2004  . PACEMAKER GENERATOR CHANGE Left 09/16/2011   MDT [place] pacemaker  . PACEMAKER PLACEMENT  05-11-2003     medtronic  . ROBOT ASSISTED LAPAROSCOPIC NEPHRECTOMY Left 03/20/2016   Procedure: XI ROBOTIC ASSISTED LAPAROSCOPIC RETROPERITONEAL NEPHRECTOMY;  Surgeon: Alexis Frock, MD;  Location: [facility];  Service: Urology;  Laterality: Left;  . THYROIDECTOMY N/A 08/07/2012   Procedure: THYROIDECTOMY;  Surgeon: Earnstine Regal, MD;  Location: [facility];  Service: General;  Laterality: N/A;  . TRANSTHORACIC ECHOCARDIOGRAM  10-12-2014   moderate focal basal LVH,  ef 55-60%,  grade 2 diastolic dysfunction, mild AV calcification without stenosis,  mild MR,  trivial PR    Family History  Problem Relation Age of Onset  . Heart disease Mother   .  Stroke Mother   . Hypertension Mother   . Heart failure Father   . Hypertension Brother   . Heart disease Brother   . Diverticulosis Sister   . Hypertension Brother   . Cancer Son        breast  . Heart attack Neg Hx     Social History   Social History  . Marital status: Single    Spouse name: N/A  . Number of children: 2  . Years of education: 16   Occupational History  . Retired Production assistant, radio for the [employer]   .  Retired   Social History Main Topics  . Smoking status: Former Smoker    Years: 15.00    Types: Cigarettes     Quit date: 10/06/1979  . Smokeless tobacco: Never Used  . Alcohol use No  . Drug use: No  . Sexual activity: Not on file   Other Topics Concern  . Not on file   Social History Narrative   The patient is a Writer of [employer].  She worked for the [employer] in [employer].  She was married for 22 years, divorced, and has remained single. She has 1 grown son, 1 grown daughter.  She has 3 grandchildren, 2 of her children   live in [place].  She is retired.  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    Outpatient Medications Prior to Visit  Medication Sig Dispense Refill  . acetaminophen (TYLENOL) 500 MG tablet Take 1,000 mg by mouth daily as needed for moderate pain.    [name] amLODipine (NORVASC) 5 MG tablet Take 1 tablet (5 mg total) by mouth daily. 90 tablet 2  . apixaban (ELIQUIS) 5 MG TABS tablet Take 1 tablet (5 mg total) by mouth 2 (two) times daily. 180 tablet 3  . denosumab (PROLIA) 60 MG/ML SOLN Inject 60 mg into the skin every 6 (six) months. Reported on 09/07/2015    . diphenhydramine-acetaminophen (TYLENOL PM) 25-500 MG TABS tablet Take 2 tablets by mouth at bedtime as needed (sleep/pain).    . fluticasone (FLONASE) 50 MCG/ACT nasal spray Place 2 sprays into both nostrils daily as needed for allergies or rhinitis. 16 g 6  . levocetirizine (XYZAL) 5 MG tablet Take 1 tablet (5 mg total) by mouth every evening. 30 tablet 1  . levothyroxine (SYNTHROID, LEVOTHROID) 88 MCG tablet TAKE ONE TABLET BY MOUTH ONCE DAILY BEFORE BREAKFAST 90 tablet 3  . lisinopril-hydrochlorothiazide (ZESTORETIC) 20-12.5 MG tablet Take 2 tablets by mouth daily. 180 tablet 3  . loratadine (CLARITIN) 10 MG tablet Take 1 tablet (10 mg total) by mouth daily as needed for allergies. 90 tablet 2  . omeprazole (PRILOSEC) 20 MG capsule Take 1 capsule (20 mg total) by mouth daily as needed (for heartburn and  indigestion). 30 capsule 11  . traMADol (ULTRAM) 50 MG tablet Take 1 tablet (50 mg total) by mouth every 8 (eight) hours as needed. 30 tablet 0   Facility-Administered Medications Prior to Visit  Medication Dose Route Frequency Provider Last Rate Last Dose  . aminophylline injection 150 mg  150 mg Intravenous BID PRN Dorothy Spark, MD   150 mg at 04/20/15 1200    Allergies  Allergen Reactions  . Tape Rash  . Codeine Nausea And Vomiting    Review of Systems  Constitutional: Negative for chills, fever and malaise/fatigue.  HENT: Negative for congestion and hearing loss.   Eyes: Negative for discharge.  Respiratory: Negative for cough, sputum production and shortness of breath.   Cardiovascular: Negative for chest pain, palpitations and leg swelling.  Gastrointestinal: Negative for abdominal pain, blood in stool, constipation, diarrhea, heartburn, nausea and vomiting.  Genitourinary: Negative for dysuria, frequency, hematuria and urgency.  Musculoskeletal: Negative for back pain, falls and myalgias.  Skin: Negative for rash.  Neurological: Negative for dizziness, sensory change, loss of consciousness, weakness and headaches.  Endo/Heme/Allergies: Negative for environmental allergies. Does not bruise/bleed easily.  Psychiatric/Behavioral: Negative for depression and suicidal ideas. The patient is not nervous/anxious and does not have insomnia.        Objective:    Physical Exam  Constitutional: She is oriented to person, place, and time. She appears well-developed and well-nourished.  HENT:  Head: Normocephalic and atraumatic.  Eyes: Conjunctivae and EOM are normal.  Neck: Normal range of motion. Neck supple. No JVD present. Carotid bruit is not present. No thyromegaly present.  Cardiovascular: Normal rate, regular rhythm and normal heart sounds.   No murmur heard. Pulmonary/Chest: Effort normal and breath sounds normal. No respiratory distress. She has no wheezes. She has no  rales. She exhibits no tenderness.  Musculoskeletal: She exhibits no edema.  Neurological: She is alert and oriented to person, place, and time.  Psychiatric: She has a normal mood and affect. Her behavior is normal. Judgment and thought content normal.  Nursing note and vitals reviewed.   BP 118/62 (BP Location: Left Arm, Cuff Size: Normal)   Pulse 81   Temp 97.6 F (36.4 C) (Oral)   Resp 16   Ht 5\' 3"  (1.6 m)   Wt 140 lb 9.6 oz (63.8 kg)   SpO2 96%   BMI 24.91 kg/m  Wt Readings from Last 3 Encounters:  12/02/16 140 lb 9.6 oz (63.8 kg)  08/13/16 133 lb (60.3 kg)  08/06/16 132 lb 6.4 oz (60.1 kg)   BP Readings from Last 3 Encounters:  12/02/16 118/62  08/13/16 (!) 154/84  08/06/16 (!) 117/56     Immunization History  Administered Date(s) Administered  . Influenza Split 04/10/2011, 03/10/2012  . Influenza Whole 03/24/2008, 04/11/2009  . Influenza,inj,Quad PF,36+ Mos 03/24/2013, 03/24/2014, 04/04/2015, 03/21/2016  . Pneumococcal Conjugate-13 06/02/2013  . Pneumococcal Polysaccharide-23 08/29/2014    Health Maintenance  Topic Date Due  . TETANUS/TDAP  08/01/1957  . INFLUENZA VACCINE  01/29/2017  . DEXA SCAN  Completed  . PNA vac Low Risk Adult  Completed    Lab Results  Component Value Date   WBC 4.2 07/30/2016   HGB 11.9 (L) 07/30/2016   HCT 35.8 (L) 07/30/2016   PLT 126.0 (L) 07/30/2016   GLUCOSE 113 (H) 08/15/2016   CHOL 188 06/07/2016   TRIG 79.0 06/07/2016   HDL 53.40 06/07/2016   LDLCALC 118 (H) 06/07/2016   ALT 13 08/15/2016   AST 21 08/15/2016   NA 136 08/15/2016   K 3.7 08/15/2016   CL 99 08/15/2016   CREATININE 1.09 08/15/2016   BUN 17 08/15/2016   CO2 30 08/15/2016   TSH 1.770 08/13/2016   INR 1.01 11/04/2013   HGBA1C 5.5 06/07/2016    Lab Results  Component Value Date   TSH 1.770 08/13/2016   Lab Results  Component Value Date   WBC 4.2 07/30/2016   HGB 11.9 (L) 07/30/2016   HCT 35.8 (L) 07/30/2016   MCV 82.3 07/30/2016   PLT 126.0  (L) 07/30/2016   Lab Results  Component Value Date   NA 136 08/15/2016   K 3.7 08/15/2016   CO2 30 08/15/2016   GLUCOSE 113 (H) 08/15/2016   BUN 17 08/15/2016   CREATININE 1.09 08/15/2016   BILITOT 0.5 08/15/2016   ALKPHOS 75 08/15/2016   AST 21 08/15/2016   ALT 13 08/15/2016   PROT 7.2 08/15/2016   ALBUMIN 4.5 08/15/2016   CALCIUM 9.7 08/15/2016   ANIONGAP 7 03/22/2016   GFR 62.43 08/15/2016   Lab Results  Component Value Date   CHOL 188 06/07/2016   Lab Results  Component Value Date   HDL 53.40 06/07/2016   Lab Results  Component Value Date   LDLCALC 118 (H) 06/07/2016   Lab Results  Component Value Date   TRIG 79.0 06/07/2016   Lab Results  Component Value Date   CHOLHDL 4 06/07/2016   Lab Results  Component Value Date   HGBA1C 5.5 06/07/2016         Assessment & Plan:   Problem List Items Addressed This Visit      Unprioritized   Essential hypertension - Primary    Well controlled, no changes to meds. Encouraged heart healthy diet such as the DASH diet and exercise as tolerated.       Relevant Orders   Lipid panel   Comprehensive metabolic panel      I am having Ms. Lamy maintain her denosumab, diphenhydramine-acetaminophen, acetaminophen, levothyroxine, lisinopril-hydrochlorothiazide, omeprazole, apixaban, loratadine, fluticasone, amLODipine, traMADol, and levocetirizine.  No orders of the defined types were placed in this encounter.   CMA served as Education administrator during this visit. History, Physical and Plan performed by medical provider. Documentation and orders reviewed and attested to.  [name], DO

## 2016-12-02 NOTE — Patient Instructions (Signed)

## 2016-12-02 NOTE — Assessment & Plan Note (Signed)
Well controlled, no changes to meds. Encouraged heart healthy diet such as the DASH diet and exercise as tolerated.  °

## 2016-12-13 MED FILL — LEVOCETIRIZINE 5 MG TABLET: 5 | 30 days supply | Qty: 30 | Fill #1

## 2016-12-31 ENCOUNTER — Encounter: Payer: Self-pay | Admitting: Family Medicine

## 2017-01-28 MED FILL — LORATADINE 10 MG TABLET: 10 | 100 days supply | Qty: 100 | Fill #1

## 2017-01-28 MED FILL — SM ALLERGY RELIEF 50 MCG SP: 50 MCG | 30 days supply | Qty: 16 | Fill #2

## 2017-01-30 MED FILL — AMLODIPINE BESYLATE 5 MG TA: 5 | 90 days supply | Qty: 90 | Fill #1

## 2017-02-10 ENCOUNTER — Telehealth: Payer: Self-pay | Admitting: Family Medicine

## 2017-02-10 NOTE — Telephone Encounter (Signed)
Prolia Benefits verified No PA required Deductible met 20% co-insurance should be covered by secondary  Patient may owe approximately $0 OOP  Due after 03/16/17

## 2017-02-11 ENCOUNTER — Ambulatory Visit (INDEPENDENT_AMBULATORY_CARE_PROVIDER_SITE_OTHER): Payer: Medicare Other | Admitting: *Deleted

## 2017-02-11 ENCOUNTER — Encounter: Payer: Self-pay | Admitting: Family Medicine

## 2017-02-11 DIAGNOSIS — I10 Essential (primary) hypertension: Secondary | ICD-10-CM

## 2017-02-11 DIAGNOSIS — I442 Atrioventricular block, complete: Secondary | ICD-10-CM | POA: Diagnosis not present

## 2017-02-11 MED ORDER — LISINOPRIL-HYDROCHLOROTHIAZIDE 20-12.5 MG PO TABS: 2.0000 | ORAL_TABLET | Freq: Every day | ORAL | 1 refills | Status: DC

## 2017-02-11 MED ORDER — LEVOCETIRIZINE DIHYDROCHLORIDE 5 MG PO TABS: 5.0000 mg | ORAL_TABLET | Freq: Every evening | ORAL | 3 refills | Status: DC

## 2017-02-11 MED FILL — LISINOPRIL-HCTZ 20-12.5 MG: 20-12.5 | 90 days supply | Qty: 180 | Fill #0

## 2017-02-11 NOTE — Telephone Encounter (Signed)
Patient request refill for Tramadol to be sent to [facility] and placed on your desk.  Last filled per database:  10/03/16 Last written: 10/03/16 Last ov: 12/02/16 Next ov: 06/03/17 Contract: not on file UDS: not on file

## 2017-02-11 NOTE — Telephone Encounter (Signed)
Ok to refill tramadol x1

## 2017-02-12 LAB — CUP PACEART REMOTE DEVICE CHECK
Battery Impedance: 520 Ohm
Battery Remaining Longevity: 68 mo
Brady Statistic AP VP Percent: 1 %
Brady Statistic AP VS Percent: 0 %
Brady Statistic AS VP Percent: 99 %
Brady Statistic AS VS Percent: 0 %
Implantable Lead Implant Date: 20041110
Lead Channel Impedance Value: 365 Ohm
Lead Channel Pacing Threshold Amplitude: 0.5 V
Lead Channel Pacing Threshold Pulse Width: 0.4 ms
Lead Channel Setting Pacing Amplitude: 2.75 V
Lead Channel Setting Pacing Pulse Width: 0.4 ms
Lead Channel Setting Sensing Sensitivity: 4 mV
MDC IDC LEAD IMPLANT DT: 20041110
MDC IDC LEAD LOCATION: 753859
MDC IDC LEAD LOCATION: 753860
MDC IDC MSMT BATTERY VOLTAGE: 2.77 V
MDC IDC MSMT LEADCHNL RA IMPEDANCE VALUE: 527 Ohm
MDC IDC MSMT LEADCHNL RV PACING THRESHOLD AMPLITUDE: 1.375 V
MDC IDC MSMT LEADCHNL RV PACING THRESHOLD PULSEWIDTH: 0.4 ms
MDC IDC PG IMPLANT DT: 20130318
MDC IDC SESS DTM: 20180814144038
MDC IDC SET LEADCHNL RA PACING AMPLITUDE: 2 V

## 2017-02-12 NOTE — Progress Notes (Signed)
Remote pacemaker check. 

## 2017-02-13 NOTE — Telephone Encounter (Signed)
Spoke with pt about the out of pocket cost of prolia injection $0, $20 copay, and 20% co insurance. Pt state that she would like to receive the prolia injections every 6 months. Pt's next injection is due after 03/16/17. Pt had no further questions or concerns. LB

## 2017-02-16 ENCOUNTER — Other Ambulatory Visit: Payer: Self-pay | Admitting: Family Medicine

## 2017-02-16 DIAGNOSIS — I10 Essential (primary) hypertension: Secondary | ICD-10-CM

## 2017-02-17 NOTE — Telephone Encounter (Signed)
Patient requesting  levocetirizine (XYZAL) 5 MG tablet please send to   [place], [place] - [address] [phone] (Phone) [phone] (Fax)   Patient checking on the status of Tramadol

## 2017-02-19 ENCOUNTER — Telehealth: Payer: Self-pay

## 2017-02-19 DIAGNOSIS — M545 Low back pain: Principal | ICD-10-CM

## 2017-02-19 DIAGNOSIS — G8929 Other chronic pain: Secondary | ICD-10-CM

## 2017-02-19 NOTE — Telephone Encounter (Signed)
Relation to pt: self  Call back number:[phone]  Pharmacy: [facility], [place] [phone] (Phone) [phone] (Fax)     Reason for call:  Patient states this has never happen before medication request was sent on 02/11/17, patient checking on the status levocetirizine (XYZAL) 5 MG tablet and traMADol (ULTRAM) 50 MG tablet

## 2017-02-19 NOTE — Telephone Encounter (Signed)
Database Need contract and uds

## 2017-02-19 NOTE — Telephone Encounter (Signed)
Requesting: Tramadol Contract:  Not listed UDS: Not on file Last OV:  6.04.18 Next OV:  12.04.18 Last Refill:  4.5.18   Please advise

## 2017-02-19 NOTE — Telephone Encounter (Signed)
Prolia has been ordered

## 2017-02-21 ENCOUNTER — Other Ambulatory Visit: Payer: Self-pay

## 2017-02-21 DIAGNOSIS — G8929 Other chronic pain: Secondary | ICD-10-CM

## 2017-02-21 DIAGNOSIS — M545 Low back pain: Principal | ICD-10-CM

## 2017-02-21 MED ORDER — LEVOCETIRIZINE DIHYDROCHLORIDE 5 MG PO TABS: 5.0000 mg | ORAL_TABLET | Freq: Every evening | ORAL | 3 refills | Status: DC

## 2017-02-21 MED ORDER — TRAMADOL HCL 50 MG PO TABS: 50.0000 mg | ORAL_TABLET | Freq: Three times a day (TID) | ORAL | 0 refills | Status: DC | PRN

## 2017-02-21 MED FILL — traMADol HCL 50 MG TABS: 50 | 10 days supply | Qty: 30 | Fill #0

## 2017-02-21 MED FILL — LEVOCETIRIZINE 5 MG TABLET: 5 | 30 days supply | Qty: 30 | Fill #0

## 2017-02-21 NOTE — Telephone Encounter (Signed)
Refused Tramadol with note to pharm for them to advise pt to contact prescriber/per provider needs Contract and UDS/thx dmf

## 2017-02-21 NOTE — Telephone Encounter (Signed)
Refill x1 

## 2017-02-21 NOTE — Telephone Encounter (Signed)
Database in media; printed 02/11/2017?

## 2017-02-21 NOTE — Telephone Encounter (Signed)
Sent Rx to pharmacy. LB 

## 2017-02-21 NOTE — Telephone Encounter (Signed)
database 

## 2017-02-24 NOTE — Telephone Encounter (Signed)
Looks like per chart Xyzal and Tramadol were refilled to pharmacy on 02/21/2017.

## 2017-02-24 NOTE — Telephone Encounter (Signed)
Prolia received and in fridge for pt. 

## 2017-02-24 NOTE — Telephone Encounter (Signed)
Left message on pt's vm to call the office to make an appointment to receive prolia injection. Patient is due to have prolia injection after 03/16/17. LB

## 2017-02-25 ENCOUNTER — Encounter: Payer: Self-pay | Admitting: Cardiology

## 2017-02-26 NOTE — Telephone Encounter (Signed)
That is fine  As long as ca and cr are normal

## 2017-02-26 NOTE — Telephone Encounter (Signed)
Patient has an appointment for 03/18/17 to receive her prolia injection. Patient had labs done in June and her BUN level was a little elevated 24, normal ranges is 6-24, calcium was in the normal ranges. Please advise. LB

## 2017-02-26 NOTE — Telephone Encounter (Signed)
Patient scheduled with  Nurse only for Tue 03/18/2017

## 2017-03-18 ENCOUNTER — Ambulatory Visit (INDEPENDENT_AMBULATORY_CARE_PROVIDER_SITE_OTHER): Payer: Medicare Other

## 2017-03-18 DIAGNOSIS — M8000XA Age-related osteoporosis with current pathological fracture, unspecified site, initial encounter for fracture: Secondary | ICD-10-CM

## 2017-03-18 DIAGNOSIS — M810 Age-related osteoporosis without current pathological fracture: Secondary | ICD-10-CM

## 2017-03-18 MED ORDER — DENOSUMAB 60 MG/ML ~~LOC~~ SOLN
60.0000 mg | Freq: Once | SUBCUTANEOUS | Status: AC
  Administered 2017-03-18: 60 mg via SUBCUTANEOUS

## 2017-03-18 NOTE — Progress Notes (Signed)
Pre visit review using our clinic tool,if applicable. No additional management support is needed unless otherwise documented below in the visit note.   Patient in for Prolia injection per order from Dr. Carollee Herter due to patient having Osteoporosis.  No complaints voiced this visit. Given 60 mg Prolia SQ right arm. Patient tolerated well.  Patient given appointment reminder card for 6 months. Patient wants to have Flu shot as well. Scheduled appointment for next week. Requested approval from Dr. Carollee Herter.

## 2017-03-25 ENCOUNTER — Ambulatory Visit: Payer: Medicare Other

## 2017-03-28 ENCOUNTER — Ambulatory Visit (INDEPENDENT_AMBULATORY_CARE_PROVIDER_SITE_OTHER): Payer: Medicare Other | Admitting: Behavioral Health

## 2017-03-28 DIAGNOSIS — Z23 Encounter for immunization: Secondary | ICD-10-CM | POA: Diagnosis not present

## 2017-03-28 NOTE — Progress Notes (Signed)
Pre visit review using our clinic review tool, if applicable. No additional management support is needed unless otherwise documented below in the visit note.  Patient came in clinic for influenza vaccination. IM injection was given in the right deltoid. Patient tolerated the injection well. No signs or symptoms of a reaction prior to patient leaving the nurse visit.

## 2017-04-24 ENCOUNTER — Telehealth: Payer: Self-pay | Admitting: Family Medicine

## 2017-04-24 NOTE — Telephone Encounter (Signed)
Called pt to schedule AWV. Lvm for pt to call office to schedule appt.   *Last AWV 06/03/2016; pt can schedule appt after 06/03/2017. SF

## 2017-04-30 ENCOUNTER — Ambulatory Visit (INDEPENDENT_AMBULATORY_CARE_PROVIDER_SITE_OTHER): Payer: Medicare Other | Admitting: Medical

## 2017-04-30 ENCOUNTER — Telehealth: Payer: Self-pay | Admitting: Medical

## 2017-04-30 ENCOUNTER — Ambulatory Visit (HOSPITAL_BASED_OUTPATIENT_CLINIC_OR_DEPARTMENT_OTHER)
Admission: RE | Admit: 2017-04-30 | Discharge: 2017-04-30 | Disposition: A | Discharge status: Home / Self Care | Payer: Medicare Other | Source: Ambulatory Visit | Attending: Medical | Admitting: Medical

## 2017-04-30 ENCOUNTER — Encounter: Payer: Self-pay | Admitting: Medical

## 2017-04-30 VITALS — BP 133/61 | HR 77 | Temp 97.4°F | Resp 16 | Ht 63.0 in | Wt 138.6 lb

## 2017-04-30 DIAGNOSIS — M25532 Pain in left wrist: Secondary | ICD-10-CM

## 2017-04-30 LAB — URIC ACID: Uric Acid, Serum: 5.5 mg/dL (ref 2.4–7.0)

## 2017-04-30 LAB — SEDIMENTATION RATE: Sed Rate: 1 mm/hr (ref 0–30)

## 2017-04-30 MED ORDER — PREDNISONE 10 MG PO TABS: ORAL_TABLET | ORAL | 0 refills | Status: DC

## 2017-04-30 MED FILL — AMLODIPINE BESYLATE 5 MG TA: 5 | 90 days supply | Qty: 90 | Fill #2

## 2017-04-30 MED FILL — LISINOPRIL-HCTZ 20-12.5 MG: 20-12.5 | 90 days supply | Qty: 180 | Fill #1

## 2017-04-30 NOTE — Progress Notes (Signed)
Subjective:    Patient ID: [name], female    DOB: 08-27-1940, 76 y.o.   MRN: [id number]  HPI  Pt in with left hand pain that is constant. Pain level 6/10. Pain is about 10 days now. Sometimes will radiate upword but not all the way up arm. Pain will hurt even without movements.   Pt states no other area that are painful/out of ordinary like left hand pain. No injury or fall. Pt rt handed.   No hx of hand achiness like this in past. Some transient pain all over hand but most of pain is at base of thumb.  Pt has been using tylenol for pain. Pt is on eliquis. Pain level with tylenol still 6/10.  She is not diabetic.    Review of Systems  Constitutional: Negative for chills, fatigue and fever.  Respiratory: Negative for cough, chest tightness, shortness of breath and wheezing.   Cardiovascular: Negative for chest pain and palpitations.  Gastrointestinal: Negative for abdominal pain.  Musculoskeletal: Negative for back pain, joint swelling and neck pain.       Wrist pain.  Skin: Negative for rash.  Neurological: Negative for dizziness, speech difficulty, weakness, numbness and headaches.  Hematological: Negative for adenopathy. Does not bruise/bleed easily.  Psychiatric/Behavioral: Negative for behavioral problems, confusion, hallucinations and sleep disturbance. The patient is not nervous/anxious and is not hyperactive.    Past Medical History:  Diagnosis Date  . Atrioventricular block, complete ([place])    a. 08/2011 Upgrage of PPM to MDT Adapta L Dual Chamber PPM ser # [id number].  . Cardiac pacemaker in situ   . Contact dermatitis and eczema    unspec cause  . Dairy product intolerance   . Depression   . Diastolic dysfunction   . GERD (gastroesophageal reflux disease)   . Hiatal hernia   . History of CHF (congestive heart failure)   . History of diverticulitis of colon    2003  perforated diverticulitis w/ surgical intervention  . History of kidney stones    age 62   . History of peripheral edema    lower extremities  . History of thyroid nodule    multinodular goiter  s/p  total thyroidectomy 2014  . HTN (hypertension)   . Hydronephrosis, left   . Hypothyroidism   . Mild sleep apnea    per study 04-14-2014  . Nocturia   . Osteoporosis   . PAF (paroxysmal atrial fibrillation) ([place])   . PONV (postoperative nausea and vomiting)   . Pulmonary nodules    stable per last ct  . Renal cell carcinoma of left kidney (HCC)   . Shortness of breath    intermittant  . Superficial thrombophlebitis      Social History   Social History  . Marital status: Single    Spouse name: N/A  . Number of children: 2  . Years of education: 16   Occupational History  . Retired Production assistant, radio for the [employer]   .  Retired   Social History Main Topics  . Smoking status: Former Smoker    Years: 15.00    Types: Cigarettes    Quit date: 10/06/1979  . Smokeless tobacco: Never Used  . Alcohol use No  . Drug use: No  . Sexual activity: Not on file   Other Topics Concern  . Not on file   Social History Narrative   The patient is a Writer of [employer].  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  She was married for 22 years, divorced, and has remained single. She has 1 grown son, 1 grown daughter.  She has 3 grandchildren, 2 of her children   live in [place].  She is retired.  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    Past Surgical History:  Procedure Laterality Date  . CARDIOVASCULAR STRESS TEST  04-20-2015  dr Caryl Comes   normal nuclear study/  no ischemia/  normal LV function and wall motion, ef 74%  . COLONOSCOPY  01/19/2013  . COLOSTOMY TAKEDOWN  07-22-2002  . CYSTOSCOPY WITH RETROGRADE PYELOGRAM, URETEROSCOPY AND STENT PLACEMENT Left 07/28/2015   Procedure: CYSTOSCOPY WITH LEFT RETROGRADE PYELOGRAM, POSSIBLE LEFT URETEROSCOPY  AND STENT PLACEMENT;  Surgeon: Franchot Gallo, MD;  Location: [facility];  Service: Urology;  Laterality: Left;  . EXPLORATORY LAPARTOMY SIGMOID COLECTOMY/ COLOSTOMY  04-28-2002   perforated diverticulitis  . LAPAROSCOPY VENTRAL HERNIA REPAIR/  EXTENSIVE LYSIS ADHESIONS  05-04-2004  . PACEMAKER GENERATOR CHANGE Left 09/16/2011   MDT [employer] pacemaker  . PACEMAKER PLACEMENT  05-11-2003     medtronic  . ROBOT ASSISTED LAPAROSCOPIC NEPHRECTOMY Left 03/20/2016   Procedure: XI ROBOTIC ASSISTED LAPAROSCOPIC RETROPERITONEAL NEPHRECTOMY;  Surgeon: Alexis Frock, MD;  Location: [facility];  Service: Urology;  Laterality: Left;  . THYROIDECTOMY N/A 08/07/2012   Procedure: THYROIDECTOMY;  Surgeon: Earnstine Regal, MD;  Location: [facility];  Service: General;  Laterality: N/A;  . TRANSTHORACIC ECHOCARDIOGRAM  10-12-2014   moderate focal basal LVH,  ef 55-60%,  grade 2 diastolic dysfunction, mild AV calcification without stenosis,  mild MR,  trivial PR    Family History  Problem Relation Age of Onset  . Heart disease Mother   . Stroke Mother   . Hypertension Mother   . Heart failure Father   . Hypertension Brother   . Heart disease Brother   . Diverticulosis Sister   . Hypertension Brother   . Cancer Son        breast  . Heart attack Neg Hx     Allergies  Allergen Reactions  . Tape Rash  . Codeine Nausea And Vomiting    Current Outpatient Prescriptions on File Prior to Visit  Medication Sig Dispense Refill  . acetaminophen (TYLENOL) 500 MG tablet Take 1,000 mg by mouth daily as needed for moderate pain.    [name] amLODipine (NORVASC) 5 MG tablet Take 1 tablet (5 mg total) by mouth daily. 90 tablet 2  . apixaban (ELIQUIS) 5 MG TABS tablet Take 1 tablet (5 mg total) by mouth 2 (two) times daily. 180 tablet 3  . denosumab (PROLIA) 60 MG/ML SOLN Inject 60 mg into the skin every 6 (six) months. Reported on 09/07/2015    . diphenhydramine-acetaminophen (TYLENOL PM) 25-500 MG TABS tablet Take 2  tablets by mouth at bedtime as needed (sleep/pain).    . fluticasone (FLONASE) 50 MCG/ACT nasal spray Place 2 sprays into both nostrils daily as needed for allergies or rhinitis. 16 g 6  . levocetirizine (XYZAL) 5 MG tablet Take 1 tablet (5 mg total) by mouth every evening. 30 tablet 3  . levothyroxine (SYNTHROID, LEVOTHROID) 88 MCG tablet TAKE ONE TABLET BY MOUTH ONCE DAILY BEFORE BREAKFAST 90 tablet 3  . lisinopril-hydrochlorothiazide (PRINZIDE,ZESTORETIC) 20-12.5 MG tablet TAKE TWO TABLETS BY MOUTH ONCE DAILY 180 tablet 3  . lisinopril-hydrochlorothiazide (ZESTORETIC) 20-12.5 MG tablet Take 2 tablets  by mouth daily. 180 tablet 1  . loratadine (CLARITIN) 10 MG tablet Take 1 tablet (10 mg total) by mouth daily as needed for allergies. 90 tablet 2  . omeprazole (PRILOSEC) 20 MG capsule Take 1 capsule (20 mg total) by mouth daily as needed (for heartburn and indigestion). 30 capsule 11  . traMADol (ULTRAM) 50 MG tablet Take 1 tablet (50 mg total) by mouth every 8 (eight) hours as needed. 30 tablet 0  . [DISCONTINUED] famotidine (PEPCID) 20 MG tablet Take 20 mg by mouth as needed.      Current Facility-Administered Medications on File Prior to Visit  Medication Dose Route Frequency Provider Last Rate Last Dose  . aminophylline injection 150 mg  150 mg Intravenous BID PRN Dorothy Spark, MD   150 mg at 04/20/15 1200    BP 133/61   Pulse 77   Temp (!) 97.4 F (36.3 C) (Oral)   Resp 16   Ht 5\' 3"  (1.6 m)   Wt 138 lb 9.6 oz (62.9 kg)   SpO2 100%   BMI 24.55 kg/m       Objective:   Physical Exam  General- No acute distress. Pleasant patient. Neck- Full range of motion, no jvd Lungs- Clear, even and unlabored. Heart- regular rate and rhythm. Neurologic- CNII- XII grossly intact.   Left wrist/hand- mild tendereness to palpation at base of thumb. Faint tenderness 1st and second metacarpal. No pain on flexion and extension of wrist.      Assessment & Plan:  For left wrist pain  will get xray of wrist today. Also will get inflammatory labs.  Use tylenol presently. I want to follow xray and labs and will likely need to rx prednisone but want to review studies before deciding on dose and duration .  Due to eliquis use need to avoid nsaids.  Follow up in 10-14 days or as needed  Jaqualin Serpa, Percell Miller, [employer]

## 2017-04-30 NOTE — Patient Instructions (Addendum)
For left wrist pain will get xray of wrist today. Also will get inflammatory labs.  Use tylenol presently. I want to follow xray and labs and will likely need to rx prednisone but want to review studies before deciding on dose and duration .  Due to eliquis use need to avoid nsaids.  Follow up in 10-14 days or as needed

## 2017-04-30 NOTE — Telephone Encounter (Signed)
Rx of prednisone sent to pt pharmacy.

## 2017-05-01 LAB — RHEUMATOID FACTOR: Rhuematoid fact SerPl-aCnc: <14 IU/mL (ref <14)

## 2017-05-13 ENCOUNTER — Telehealth: Payer: Self-pay | Admitting: Cardiology

## 2017-05-13 ENCOUNTER — Ambulatory Visit (INDEPENDENT_AMBULATORY_CARE_PROVIDER_SITE_OTHER): Payer: Medicare Other | Admitting: *Deleted

## 2017-05-13 DIAGNOSIS — I442 Atrioventricular block, complete: Secondary | ICD-10-CM | POA: Diagnosis not present

## 2017-05-13 NOTE — Telephone Encounter (Signed)
LMOVM reminding pt to send remote transmission.   

## 2017-05-13 NOTE — Progress Notes (Signed)
Remote pacemaker transmission.   

## 2017-05-14 LAB — CUP PACEART REMOTE DEVICE CHECK
Battery Impedance: 594 Ohm
Battery Remaining Longevity: 73 mo
Brady Statistic AP VS Percent: 0 %
Brady Statistic AS VS Percent: 0 %
Implantable Lead Implant Date: 20041110
Implantable Lead Location: 753860
Implantable Pulse Generator Implant Date: 20130318
Lead Channel Impedance Value: 392 Ohm
Lead Channel Pacing Threshold Amplitude: 1.125 V
Lead Channel Pacing Threshold Pulse Width: 0.4 ms
Lead Channel Setting Pacing Amplitude: 2 V
Lead Channel Setting Pacing Amplitude: 2.5 V
Lead Channel Setting Pacing Pulse Width: 0.4 ms
Lead Channel Setting Sensing Sensitivity: 4 mV
MDC IDC LEAD IMPLANT DT: 20041110
MDC IDC LEAD LOCATION: 753859
MDC IDC MSMT BATTERY VOLTAGE: 2.78 V
MDC IDC MSMT LEADCHNL RA IMPEDANCE VALUE: 565 Ohm
MDC IDC MSMT LEADCHNL RA PACING THRESHOLD AMPLITUDE: 0.5 V
MDC IDC MSMT LEADCHNL RA PACING THRESHOLD PULSEWIDTH: 0.4 ms
MDC IDC SESS DTM: 20181113191836
MDC IDC STAT BRADY AP VP PERCENT: 1 %
MDC IDC STAT BRADY AS VP PERCENT: 99 %

## 2017-05-16 ENCOUNTER — Encounter: Payer: Self-pay | Admitting: Cardiology

## 2017-05-29 ENCOUNTER — Other Ambulatory Visit: Payer: Self-pay

## 2017-05-29 ENCOUNTER — Ambulatory Visit (HOSPITAL_BASED_OUTPATIENT_CLINIC_OR_DEPARTMENT_OTHER)
Admission: RE | Admit: 2017-05-29 | Discharge: 2017-05-29 | Disposition: A | Discharge status: Home / Self Care | Payer: Medicare Other | Source: Ambulatory Visit | Attending: Family Medicine | Admitting: Family Medicine

## 2017-05-29 DIAGNOSIS — M818 Other osteoporosis without current pathological fracture: Secondary | ICD-10-CM | POA: Diagnosis not present

## 2017-05-29 DIAGNOSIS — E2839 Other primary ovarian failure: Secondary | ICD-10-CM | POA: Insufficient documentation

## 2017-05-29 DIAGNOSIS — M810 Age-related osteoporosis without current pathological fracture: Secondary | ICD-10-CM

## 2017-06-03 ENCOUNTER — Encounter: Payer: Self-pay | Admitting: Family Medicine

## 2017-06-03 ENCOUNTER — Ambulatory Visit (INDEPENDENT_AMBULATORY_CARE_PROVIDER_SITE_OTHER): Payer: Medicare Other | Admitting: Family Medicine

## 2017-06-03 VITALS — BP 122/62 | HR 86 | Temp 97.5°F | Resp 16 | Ht 63.0 in | Wt 137.4 lb

## 2017-06-03 DIAGNOSIS — Z889 Allergy status to unspecified drugs, medicaments and biological substances status: Secondary | ICD-10-CM

## 2017-06-03 DIAGNOSIS — E785 Hyperlipidemia, unspecified: Secondary | ICD-10-CM | POA: Diagnosis not present

## 2017-06-03 DIAGNOSIS — E039 Hypothyroidism, unspecified: Secondary | ICD-10-CM | POA: Diagnosis not present

## 2017-06-03 DIAGNOSIS — I10 Essential (primary) hypertension: Secondary | ICD-10-CM

## 2017-06-03 MED ORDER — LORATADINE 10 MG PO TABS: 10.0000 mg | ORAL_TABLET | Freq: Every day | ORAL | 2 refills | Status: DC | PRN

## 2017-06-03 MED ORDER — FLUTICASONE PROPIONATE 50 MCG/ACT NA SUSP: 2.0000 | Freq: Every day | NASAL | 6 refills | Status: DC | PRN

## 2017-06-03 MED ORDER — LEVOTHYROXINE SODIUM 88 MCG PO TABS: ORAL_TABLET | ORAL | 3 refills | Status: DC

## 2017-06-03 NOTE — Assessment & Plan Note (Signed)
Encouraged heart healthy diet, increase exercise, avoid trans fats, consider a krill oil cap daily 

## 2017-06-03 NOTE — Patient Instructions (Signed)

## 2017-06-03 NOTE — Progress Notes (Deleted)
Patient ID: [name], female   DOB: Sep 21, 1940, 76 y.o.   MRN: [id number]    Subjective:  I acted as a Education administrator for Dr. Carollee [name].  [name], [name]   Patient ID: [name], female    DOB: 1940-10-13, 76 y.o.   MRN: [id number]  Chief Complaint  Patient presents with  . Hypertension    HPI  Patient is in today for follow up blood pressure.   Patient Care Team: [name], Daisy Apa, DO as PCP - General (Family Medicine) Daisy Berthold, NP as Nurse Practitioner (Cardiology) Daisy Frock, MD as Consulting Physician (Urology) Daisy Sprang, MD as Consulting Physician (Cardiology) Daisy Lobo, MD as Consulting Physician (Gastroenterology)   Past Medical History:  Diagnosis Date  . Atrioventricular block, complete ([place])    a. 08/2011 Upgrage of PPM to MDT Adapta L Dual Chamber PPM ser # [id number].  . Cardiac pacemaker in situ   . Contact dermatitis and eczema    unspec cause  . Dairy product intolerance   . Depression   . Diastolic dysfunction   . GERD (gastroesophageal reflux disease)   . Hiatal hernia   . History of CHF (congestive heart failure)   . History of diverticulitis of colon    2003  perforated diverticulitis w/ surgical intervention  . History of kidney stones    age 58  . History of peripheral edema    lower extremities  . History of thyroid nodule    multinodular goiter  s/p  total thyroidectomy 2014  . HTN (hypertension)   . Hydronephrosis, left   . Hypothyroidism   . Mild sleep apnea    per study 04-14-2014  . Nocturia   . Osteoporosis   . PAF (paroxysmal atrial fibrillation) ([place])   . PONV (postoperative nausea and vomiting)   . Pulmonary nodules    stable per last ct  . Renal cell carcinoma of left kidney (HCC)   . Shortness of breath    intermittant  . Superficial thrombophlebitis     Past Surgical History:  Procedure Laterality Date  . CARDIOVASCULAR STRESS TEST  04-20-2015  dr Caryl Comes   normal nuclear study/  no ischemia/   normal LV function and wall motion, ef 74%  . COLONOSCOPY  01/19/2013  . COLOSTOMY TAKEDOWN  07-22-2002  . CYSTOSCOPY WITH RETROGRADE PYELOGRAM, URETEROSCOPY AND STENT PLACEMENT Left 07/28/2015   Procedure: CYSTOSCOPY WITH LEFT RETROGRADE PYELOGRAM, POSSIBLE LEFT URETEROSCOPY AND STENT PLACEMENT;  Surgeon: Franchot Gallo, MD;  Location[facility];  Service: Urology;  Laterality: Left;  . EXPLORATORY LAPARTOMY SIGMOID COLECTOMY/ COLOSTOMY  04-28-2002   perforated diverticulitis  . LAPAROSCOPY VENTRAL HERNIA REPAIR/  EXTENSIVE LYSIS ADHESIONS  05-04-2004  . PACEMAKER GENERATOR CHANGE Left 09/16/2011   MDT [place] pacemaker  . PACEMAKER PLACEMENT  05-11-2003     medtronic  . ROBOT ASSISTED LAPAROSCOPIC NEPHRECTOMY Left 03/20/2016   Procedure: XI ROBOTIC ASSISTED LAPAROSCOPIC RETROPERITONEAL NEPHRECTOMY;  Surgeon: Daisy Frock, MD;  [facility];  Service: Urology;  Laterality: Left;  . THYROIDECTOMY N/A 08/07/2012   Procedure: THYROIDECTOMY;  Surgeon: Earnstine Regal, MD;  Location: [facility];  Service: General;  Laterality: N/A;  . TRANSTHORACIC ECHOCARDIOGRAM  10-12-2014   moderate focal basal LVH,  ef 55-60%,  grade 2 diastolic dysfunction, mild AV calcification without stenosis,  mild MR,  trivial PR    Family History  Problem Relation Age of Onset  . Heart disease Mother   . Stroke Mother   . Hypertension  Mother   . Heart failure Father   . Hypertension Brother   . Heart disease Brother   . Diverticulosis Sister   . Hypertension Brother   . Cancer Son        breast  . Heart attack Neg Hx     Social History   Socioeconomic History  . Marital status: Single    Spouse name: Not on file  . Number of children: 2  . Years of education: 28  . Highest education level: Not on file  Social Needs  . Financial resource strain: Not on file  . Food insecurity - worry: Not on file  . Food insecurity - inability: Not on file  . Transportation needs - medical: Not on  file  . Transportation needs - non-medical: Not on file  Occupational History  . 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 Smoking status: Former Smoker    Years: 15.00    Types: Cigarettes    Last attempt to quit: 10/06/1979    Years since quitting: 37.6  . Smokeless tobacco: Never Used  Substance and Sexual Activity  . Alcohol use: No  . Drug use: No  . Sexual activity: Not on file  Other Topics Concern  . 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  She was married for 22 years, divorced, and has remained single. She has 1 grown son, 1 grown daughter.  She has 3 grandchildren, 2 of her children   live in [place].  She is retired.  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    Outpatient Medications Prior to Visit  Medication Sig Dispense Refill  . acetaminophen (TYLENOL) 500 MG tablet Take 1,000 mg by mouth daily as needed for moderate pain.    [name] amLODipine (NORVASC) 5 MG tablet Take 1 tablet (5 mg total) by mouth daily. 90 tablet 2  . apixaban (ELIQUIS) 5 MG TABS tablet Take 1 tablet (5 mg total) by mouth 2 (two) times daily. 180 tablet 3  . denosumab (PROLIA) 60 MG/ML SOLN Inject 60 mg into the skin every 6 (six) months. Reported on 09/07/2015    . diphenhydramine-acetaminophen (TYLENOL PM) 25-500 MG TABS tablet Take 2 tablets by mouth at bedtime as needed (sleep/pain).    . fluticasone (FLONASE) 50 MCG/ACT nasal spray Place 2 sprays into both nostrils daily as needed for allergies or rhinitis. 16 g 6  . levocetirizine (XYZAL) 5 MG tablet Take 1 tablet (5 mg total) by mouth every evening. 30 tablet 3  . levothyroxine (SYNTHROID, LEVOTHROID) 88 MCG tablet TAKE ONE TABLET BY MOUTH ONCE DAILY BEFORE BREAKFAST 90 tablet 3  .  lisinopril-hydrochlorothiazide (PRINZIDE,ZESTORETIC) 20-12.5 MG tablet TAKE TWO TABLETS BY MOUTH ONCE DAILY 180 tablet 3  . loratadine (CLARITIN) 10 MG tablet Take 1 tablet (10 mg total) by mouth daily as needed for allergies. 90 tablet 2  . omeprazole (PRILOSEC) 20 MG capsule Take 1 capsule (20 mg total) by mouth daily as needed (for heartburn and indigestion). 30 capsule 11  . traMADol (ULTRAM) 50 MG tablet Take 1 tablet (50 mg total) by mouth every 8 (eight) hours as needed. 30 tablet 0  . lisinopril-hydrochlorothiazide (ZESTORETIC) 20-12.5 MG tablet Take 2 tablets  by mouth daily. 180 tablet 1  . predniSONE (DELTASONE) 10 MG tablet 4 tab po day 1, 3 tab po day 2, 2 tab po day 3, 1 tab po day 4. 10 tablet 0   Facility-Administered Medications Prior to Visit  Medication Dose Route Frequency Provider Last Rate Last Dose  . aminophylline injection 150 mg  150 mg Intravenous BID PRN Dorothy Spark, MD   150 mg at 04/20/15 1200    Allergies  Allergen Reactions  . Tape Rash  . Codeine Nausea And Vomiting    Review of Systems  Constitutional: Negative for fever and malaise/fatigue.  HENT: Negative for congestion.   Eyes: Negative for blurred vision.  Respiratory: Negative for cough and shortness of breath.   Cardiovascular: Negative for chest pain, palpitations and leg swelling.  Gastrointestinal: Negative for vomiting.  Musculoskeletal: Negative for back pain.  Skin: Negative for rash.  Neurological: Negative for loss of consciousness and headaches.       Objective:    Physical Exam  BP (!) 146/69 (BP Location: Right Arm, Cuff Size: Normal)   Pulse 86   Temp (!) 97.5 F (36.4 C) (Oral)   Resp 16   Ht 5\' 3"  (1.6 m)   Wt 137 lb 6.4 oz (62.3 kg)   SpO2 100%   BMI 24.34 kg/m  Wt Readings from Last 3 Encounters:  06/03/17 137 lb 6.4 oz (62.3 kg)  04/30/17 138 lb 9.6 oz (62.9 kg)  12/02/16 140 lb 9.6 oz (63.8 kg)   BP Readings from Last 3 Encounters:  06/03/17 (!) 146/69    04/30/17 133/61  12/02/16 118/62     Immunization History  Administered Date(s) Administered  . Influenza Split 04/10/2011, 03/10/2012  . Influenza Whole 03/24/2008, 04/11/2009  . Influenza, High Dose Seasonal PF 03/28/2017  . Influenza,inj,Quad PF,6+ Mos 03/24/2013, 03/24/2014, 04/04/2015, 03/21/2016  . Pneumococcal Conjugate-13 06/02/2013  . Pneumococcal Polysaccharide-23 08/29/2014    Health Maintenance  Topic Date Due  . TETANUS/TDAP  08/01/1957  . INFLUENZA VACCINE  Completed  . DEXA SCAN  Completed  . PNA vac Low Risk Adult  Completed    Lab Results  Component Value Date   WBC 4.2 07/30/2016   HGB 11.9 (L) 07/30/2016   HCT 35.8 (L) 07/30/2016   PLT 126.0 (L) 07/30/2016   GLUCOSE 77 12/02/2016   CHOL 161 12/02/2016   TRIG 131.0 12/02/2016   HDL 46.40 12/02/2016   LDLCALC 88 12/02/2016   ALT 9 12/02/2016   AST 21 12/02/2016   NA 134 (L) 12/02/2016   K 3.9 12/02/2016   CL 98 12/02/2016   CREATININE 1.11 12/02/2016   BUN 24 (H) 12/02/2016   CO2 30 12/02/2016   TSH 1.770 08/13/2016   INR 1.01 11/04/2013   HGBA1C 5.5 06/07/2016    Lab Results  Component Value Date   TSH 1.770 08/13/2016   Lab Results  Component Value Date   WBC 4.2 07/30/2016   HGB 11.9 (L) 07/30/2016   HCT 35.8 (L) 07/30/2016   MCV 82.3 07/30/2016   PLT 126.0 (L) 07/30/2016   Lab Results  Component Value Date   NA 134 (L) 12/02/2016   K 3.9 12/02/2016   CO2 30 12/02/2016   GLUCOSE 77 12/02/2016   BUN 24 (H) 12/02/2016   CREATININE 1.11 12/02/2016   BILITOT 0.6 12/02/2016   ALKPHOS 44 12/02/2016   AST 21 12/02/2016   ALT 9 12/02/2016   PROT 6.8 12/02/2016   ALBUMIN 4.3 12/02/2016   CALCIUM  9.6 12/02/2016   ANIONGAP 7 03/22/2016   GFR 61.08 12/02/2016   Lab Results  Component Value Date   CHOL 161 12/02/2016   Lab Results  Component Value Date   HDL 46.40 12/02/2016   Lab Results  Component Value Date   LDLCALC 88 12/02/2016   Lab Results  Component Value Date    TRIG 131.0 12/02/2016   Lab Results  Component Value Date   CHOLHDL 3 12/02/2016   Lab Results  Component Value Date   HGBA1C 5.5 06/07/2016         Assessment & Plan:   Problem List Items Addressed This Visit    None    Visit Diagnoses    H/O seasonal allergies          I have discontinued [employer]. Ahuja's predniSONE. I am also having her maintain her denosumab, diphenhydramine-acetaminophen, acetaminophen, levothyroxine, omeprazole, apixaban, loratadine, fluticasone, amLODipine, lisinopril-hydrochlorothiazide, traMADol, and levocetirizine.  No orders of the defined types were placed in this encounter.   {PROVIDER TO DELETE} Jerene Dilling, CMA

## 2017-06-03 NOTE — Assessment & Plan Note (Signed)
Well controlled, no changes to meds. Encouraged heart healthy diet such as the DASH diet and exercise as tolerated.  °

## 2017-06-03 NOTE — Progress Notes (Signed)
Patient ID: [name], female    DOB: December 27, 1940  Age: 76 y.o. MRN: [id number]    Subjective:  Subjective  HPI [name] presents for f/u bp and cholesterol -- she also needs refills on claritin and flonase No other complaints  Review of Systems  Constitutional: Negative for activity change, appetite change, fatigue and unexpected weight change.  Respiratory: Negative for cough and shortness of breath.   Cardiovascular: Negative for chest pain and palpitations.  Psychiatric/Behavioral: Negative for behavioral problems and dysphoric mood. The patient is not nervous/anxious.     History Past Medical History:  Diagnosis Date  . Atrioventricular block, complete ([place])    a. 08/2011 Upgrage of PPM to MDT Adapta L Dual Chamber PPM ser # [id number].  . Cardiac pacemaker in situ   . Contact dermatitis and eczema    unspec cause  . Dairy product intolerance   . Depression   . Diastolic dysfunction   . GERD (gastroesophageal reflux disease)   . Hiatal hernia   . History of CHF (congestive heart failure)   . History of diverticulitis of colon    2003  perforated diverticulitis w/ surgical intervention  . History of kidney stones    age 74  . History of peripheral edema    lower extremities  . History of thyroid nodule    multinodular goiter  s/p  total thyroidectomy 2014  . HTN (hypertension)   . Hydronephrosis, left   . Hypothyroidism   . Mild sleep apnea    per study 04-14-2014  . Nocturia   . Osteoporosis   . PAF (paroxysmal atrial fibrillation) ([place])   . PONV (postoperative nausea and vomiting)   . Pulmonary nodules    stable per last ct  . Renal cell carcinoma of left kidney (HCC)   . Shortness of breath    intermittant  . Superficial thrombophlebitis     She has a past surgical history that includes Thyroidectomy (N/A, 08/07/2012); Colonoscopy (01/19/2013); pacemaker generator change (Left, 09/16/2011); pacemaker placement (05-11-2003  ); EXPLORATORY LAPARTOMY  SIGMOID COLECTOMY/ COLOSTOMY (04-28-2002); Colostomy takedown (07-22-2002); LAPAROSCOPY VENTRAL HERNIA REPAIR/  EXTENSIVE LYSIS ADHESIONS (05-04-2004); Cardiovascular stress test (04-20-2015  dr Caryl Comes); transthoracic echocardiogram (10-12-2014); Cystoscopy with retrograde pyelogram, ureteroscopy and stent placement (Left, 07/28/2015); and Robot assisted laparoscopic nephrectomy (Left, 03/20/2016).   Her family history includes Cancer in her son; Diverticulosis in her sister; Heart disease in her brother and mother; Heart failure in her father; Hypertension in her brother, brother, and mother; Stroke in her mother.She reports that she quit smoking about 37 years ago. Her smoking use included cigarettes. She quit after 15.00 years of use. she has never used smokeless tobacco. She reports that she does not drink alcohol or use drugs.  Current Outpatient Medications on File Prior to Visit  Medication Sig Dispense Refill  . acetaminophen (TYLENOL) 500 MG tablet Take 1,000 mg by mouth daily as needed for moderate pain.    [name] amLODipine (NORVASC) 5 MG tablet Take 1 tablet (5 mg total) by mouth daily. 90 tablet 2  . apixaban (ELIQUIS) 5 MG TABS tablet Take 1 tablet (5 mg total) by mouth 2 (two) times daily. 180 tablet 3  . denosumab (PROLIA) 60 MG/ML SOLN Inject 60 mg into the skin every 6 (six) months. Reported on 09/07/2015    . diphenhydramine-acetaminophen (TYLENOL PM) 25-500 MG TABS tablet Take 2 tablets by mouth at bedtime as needed (sleep/pain).    [name] levocetirizine (XYZAL) 5 MG tablet Take 1 tablet (  5 mg total) by mouth every evening. 30 tablet 3  . lisinopril-hydrochlorothiazide (PRINZIDE,ZESTORETIC) 20-12.5 MG tablet TAKE TWO TABLETS BY MOUTH ONCE DAILY 180 tablet 3  . omeprazole (PRILOSEC) 20 MG capsule Take 1 capsule (20 mg total) by mouth daily as needed (for heartburn and indigestion). 30 capsule 11  . traMADol (ULTRAM) 50 MG tablet Take 1 tablet (50 mg total) by mouth every 8 (eight) hours as needed.  30 tablet 0  . [DISCONTINUED] famotidine (PEPCID) 20 MG tablet Take 20 mg by mouth as needed.      Current Facility-Administered Medications on File Prior to Visit  Medication Dose Route Frequency Provider Last Rate Last Dose  . aminophylline injection 150 mg  150 mg Intravenous BID PRN Dorothy Spark, MD   150 mg at 04/20/15 1200     Objective:  Objective  Physical Exam  Constitutional: She is oriented to person, place, and time. She appears well-developed and well-nourished.  HENT:  Head: Normocephalic and atraumatic.  Eyes: Conjunctivae and EOM are normal.  Neck: Normal range of motion. Neck supple. No JVD present. Carotid bruit is not present. No thyromegaly present.  Cardiovascular: Normal rate, regular rhythm and normal heart sounds.  No murmur heard. Pulmonary/Chest: Effort normal and breath sounds normal. No respiratory distress. She has no wheezes. She has no rales. She exhibits no tenderness.  Musculoskeletal: She exhibits no edema.  Neurological: She is alert and oriented to person, place, and time.  Psychiatric: She has a normal mood and affect.  Nursing note and vitals reviewed.  BP 122/62   Pulse 86   Temp (!) 97.5 F (36.4 C) (Oral)   Resp 16   Ht 5\' 3"  (1.6 m)   Wt 137 lb 6.4 oz (62.3 kg)   SpO2 100%   BMI 24.34 kg/m  Wt Readings from Last 3 Encounters:  06/03/17 137 lb 6.4 oz (62.3 kg)  04/30/17 138 lb 9.6 oz (62.9 kg)  12/02/16 140 lb 9.6 oz (63.8 kg)     Lab Results  Component Value Date   WBC 4.2 07/30/2016   HGB 11.9 (L) 07/30/2016   HCT 35.8 (L) 07/30/2016   PLT 126.0 (L) 07/30/2016   GLUCOSE 77 12/02/2016   CHOL 161 12/02/2016   TRIG 131.0 12/02/2016   HDL 46.40 12/02/2016   LDLCALC 88 12/02/2016   ALT 9 12/02/2016   AST 21 12/02/2016   NA 134 (L) 12/02/2016   K 3.9 12/02/2016   CL 98 12/02/2016   CREATININE 1.11 12/02/2016   BUN 24 (H) 12/02/2016   CO2 30 12/02/2016   TSH 1.770 08/13/2016   INR 1.01 11/04/2013   HGBA1C 5.5  06/07/2016    Dg Bone Density  Result Date: 05/29/2017 EXAM: DUAL X-RAY ABSORPTIOMETRY (DXA) FOR BONE MINERAL DENSITY IMPRESSION: Referring Physician:  Rosalita Chessman [name] PATIENT: Name: Daisy, [name] Patient ID: [id number] Birth Date: 02-Feb-1941 Height: 63.0 in. Sex: Female Measured: 05/29/2017 Weight: 135.6 lbs. Indications: Advanced Age, Caucasian, Estrogen Deficiency, Family Hx of Osteoporosis, Height Loss, History of Fracture (Adult), History of Osteopenia, Hypothyroidism, Post Menopausal, Previous Tobacco User, Secondary Osteoporosis Fractures: Lumbar Spine, Thoracic Spine Treatments: Calcium, Multivitamin, Prolia, Synthroid, Vitamin D ASSESSMENT: The BMD measured at Forearm Radius 33% is 0.600 g/cm2 with a T-score of -3.2. This patient is considered OSTEOPOROTIC according to [facility]) criteria. Lumbar spine was not utilized due to compression fractures. Site Region Measured Date Measured Age WHO YA BMD Classification T-score DualFemur Neck Left 3/29/2017 78.8 [age] Osteoporosis -3.1  0.612 g/cm2 Left Forearm Radius 33% 05/29/2017 78.8 Osteoporosis -3.2 0.600 g/cm2 World Health Organization [facility]) criteria for post-menopausal, Caucasian Women: Normal       T-score at or above -1 SD Osteopenia   T-score between -1 and -2.5 SD Osteoporosis T-score at or below -2.5 SD RECOMMENDATION: [place] recommends that FDA-approved medical therapies be considered in postmenopausal women and men age 102 or older with a: 1. Hip or vertebral (clinical or morphometric) fracture. 2. T-score of < -2.5 at the spine or hip. 3. Ten-year fracture probability by FRAX of 3% or greater for hip fracture or 20% or greater for major osteoporotic fracture. All treatment decisions require clinical judgment and consideration of individual patient factors, including patient preferences, co-morbidities, previous drug use, risk factors not captured in the FRAX model (e.g. falls, vitamin D  deficiency, increased bone turnover, interval significant decline in bone density) and possible under - or over-estimation of fracture risk by FRAX. All patients should ensure an adequate intake of dietary calcium (1200 mg/d) and vitamin D (800 IU daily) unless contraindicated. FOLLOW-UP: People with diagnosed cases of osteoporosis or at high risk for fracture should have regular bone mineral density tests. For patients eligible for Medicare, routine testing is allowed once every 2 years. The testing frequency can be increased to one year for patients who have rapidly progressing disease, those who are receiving or discontinuing medical therapy to restore bone mass, or have additional risk factors. I have reviewed this report and agree with the above findings. Mark A. Thornton Papas, M.D. [facility] Electronically Signed   By: Lavonia Dana M.D.   On: 05/29/2017 12:18     Assessment & Plan:  Plan  I have discontinued Baljit Liebert. Spegal's predniSONE. I am also having her maintain her denosumab, diphenhydramine-acetaminophen, acetaminophen, omeprazole, apixaban, amLODipine, lisinopril-hydrochlorothiazide, traMADol, levocetirizine, loratadine, fluticasone, and levothyroxine.  Meds ordered this encounter  Medications  . loratadine (CLARITIN) 10 MG tablet    Sig: Take 1 tablet (10 mg total) by mouth daily as needed for allergies.    Dispense:  90 tablet    Refill:  2  . fluticasone (FLONASE) 50 MCG/ACT nasal spray    Sig: Place 2 sprays into both nostrils daily as needed for allergies or rhinitis.    Dispense:  16 g    Refill:  6  . levothyroxine (SYNTHROID, LEVOTHROID) 88 MCG tablet    Sig: TAKE ONE TABLET BY MOUTH ONCE DAILY BEFORE BREAKFAST    Dispense:  90 tablet    Refill:  3    Problem List Items Addressed This Visit      Unprioritized   Essential hypertension    Well controlled, no changes to meds. Encouraged heart healthy diet such as the DASH diet and exercise as tolerated.        Hyperlipidemia    Encouraged heart healthy diet, increase exercise, avoid trans fats, consider a krill oil cap daily       Other Visit Diagnoses    Hyperlipidemia LDL goal <100    -  Primary   Relevant Orders   Comprehensive metabolic panel   Lipid panel   H/O seasonal allergies       Relevant Medications   loratadine (CLARITIN) 10 MG tablet   fluticasone (FLONASE) 50 MCG/ACT nasal spray   Hypothyroidism, unspecified type       Relevant Medications   levothyroxine (SYNTHROID, LEVOTHROID) 88 MCG tablet   Other Relevant Orders   TSH    1. H/O seasonal allergies  Controlled with meds - loratadine (CLARITIN) 10 MG tablet; Take 1 tablet (10 mg total) by mouth daily as needed for allergies.  Dispense: 90 tablet; Refill: 2 - fluticasone (FLONASE) 50 MCG/ACT nasal spray; Place 2 sprays into both nostrils daily as needed for allergies or rhinitis.  Dispense: 16 g; Refill: 6  2. Hyperlipidemia LDL goal <100 Encouraged heart healthy diet, increase exercise, avoid trans fats, consider a krill oil cap daily - Comprehensive metabolic panel; Future - Lipid panel; Future  3. Hypothyroidism, unspecified type stable - levothyroxine (SYNTHROID, LEVOTHROID) 88 MCG tablet; TAKE ONE TABLET BY MOUTH ONCE DAILY BEFORE BREAKFAST  Dispense: 90 tablet; Refill: 3 - TSH; Future  4. Essential hypertension Well controlled, no changes to meds. Encouraged heart healthy diet such as the DASH diet and exercise as tolerated.      Follow-up: Return in about 6 months (around 12/02/2017) for hypertension, hyperlipidemia, fasting.  [name], DO

## 2017-06-05 ENCOUNTER — Other Ambulatory Visit (INDEPENDENT_AMBULATORY_CARE_PROVIDER_SITE_OTHER): Payer: Medicare Other

## 2017-06-05 ENCOUNTER — Other Ambulatory Visit: Payer: Medicare Other

## 2017-06-05 ENCOUNTER — Other Ambulatory Visit: Payer: Self-pay | Admitting: *Deleted

## 2017-06-05 DIAGNOSIS — E039 Hypothyroidism, unspecified: Secondary | ICD-10-CM

## 2017-06-05 DIAGNOSIS — E785 Hyperlipidemia, unspecified: Secondary | ICD-10-CM

## 2017-06-05 DIAGNOSIS — I10 Essential (primary) hypertension: Secondary | ICD-10-CM

## 2017-06-05 DIAGNOSIS — E7800 Pure hypercholesterolemia, unspecified: Secondary | ICD-10-CM

## 2017-06-05 LAB — COMPREHENSIVE METABOLIC PANEL
ALT: 10 U/L (ref 0–35)
AST: 19 U/L (ref 0–37)
Albumin: 4.7 g/dL (ref 3.5–5.2)
Alkaline Phosphatase: 44 U/L (ref 39–117)
BILIRUBIN TOTAL: 0.6 mg/dL (ref 0.2–1.2)
BUN: 14 mg/dL (ref 6–23)
CALCIUM: 9.1 mg/dL (ref 8.4–10.5)
CO2: 27 meq/L (ref 19–32)
CREATININE: 1.09 mg/dL (ref 0.40–1.20)
Chloride: 97 mEq/L (ref 96–112)
GFR: 62.3 mL/min (ref >60.00)
Glucose, Bld: 100 mg/dL — ABNORMAL HIGH (ref 70–99)
Potassium: 5 mEq/L (ref 3.5–5.1)
SODIUM: 134 meq/L — AB (ref 135–145)
Total Protein: 7 g/dL (ref 6.0–8.3)

## 2017-06-05 LAB — LIPID PANEL
CHOL/HDL RATIO: 4
CHOLESTEROL: 183 mg/dL (ref 0–200)
HDL: 47.8 mg/dL (ref >39.00)
LDL CALC: 111 mg/dL — AB (ref 0–99)
NonHDL: 134.79
TRIGLYCERIDES: 121 mg/dL (ref 0.0–149.0)
VLDL: 24.2 mg/dL (ref 0.0–40.0)

## 2017-06-05 LAB — TSH: TSH: 1.14 u[IU]/mL (ref 0.35–4.50)

## 2017-06-20 ENCOUNTER — Telehealth: Payer: Self-pay | Admitting: *Deleted

## 2017-06-20 NOTE — Telephone Encounter (Signed)
Copied from [place]. Topic: Referral - Question >> Jun 18, 2017  1:16 PM Synthia Innocent wrote: Reason for CRM: Patient was unaware of referral to Endocrinology, would like to know what is going on. Please advise

## 2017-06-20 NOTE — Telephone Encounter (Signed)
Patient stated that she was notified about appointment for endocrinology.  Explained to her what the referral was for.  She agrees to see endo.  Do I need to put in another referral or can we do it from the one on file already?

## 2017-06-29 ENCOUNTER — Other Ambulatory Visit: Payer: Self-pay | Admitting: Family Medicine

## 2017-06-29 DIAGNOSIS — E039 Hypothyroidism, unspecified: Secondary | ICD-10-CM

## 2017-07-16 ENCOUNTER — Encounter: Payer: Self-pay | Admitting: Family Medicine

## 2017-07-28 ENCOUNTER — Other Ambulatory Visit: Payer: Self-pay | Admitting: Family Medicine

## 2017-07-28 DIAGNOSIS — I10 Essential (primary) hypertension: Secondary | ICD-10-CM

## 2017-07-28 MED FILL — LISINOPRIL-HCTZ 20-12.5 MG: 20-12.5 | 90 days supply | Qty: 180 | Fill #0

## 2017-07-28 MED FILL — AMLODIPINE BESYLATE 5 MG TA: 5 | 90 days supply | Qty: 90 | Fill #0

## 2017-07-28 MED FILL — LEVOCETIRIZINE 5 MG TABLET: 5 | 30 days supply | Qty: 30 | Fill #1

## 2017-07-30 ENCOUNTER — Telehealth: Payer: Self-pay | Admitting: Internal Medicine

## 2017-07-30 NOTE — Telephone Encounter (Signed)
New message  Patient says that she receives Eliquis from [facility] assistance program but she hasn't received any since 04/10/2017, Pt wants to know if she needs to do another application, it was done in our office. Please call

## 2017-07-30 NOTE — Telephone Encounter (Signed)
Patient didn't realize she needs to complete new patient assistance forms for this year.  Only has 5 days worth of Eliquis left.  I advised her to come by on Friday to pick up samples and at that time we will provide patient assistance forms to be completed as well.    Will route to [place], LPN to inform pt will be requesting the forms.

## 2017-07-31 NOTE — Telephone Encounter (Addendum)
Have patient a [name] patient assistance form here in office for help with ELIQUIS. Patient is suppose to come on Friday 08/01/17 to get samples.  This is a renewal for this year. Will place in Dr Aquilla Hacker mailbox for signature.

## 2017-08-05 NOTE — Telephone Encounter (Signed)
The pt returned her [employer] pt assistance application. I have placed it in the green folder with the provider part awaiting Dr Aquilla Hacker signature.

## 2017-08-06 NOTE — Telephone Encounter (Signed)
**Note De-Identified [name] Obfuscation** Dr Caryl Comes has signed the provider part of the [employer] pt assistance application and the Eliquis RX and I have faxed all to [employer].

## 2017-08-12 ENCOUNTER — Ambulatory Visit (INDEPENDENT_AMBULATORY_CARE_PROVIDER_SITE_OTHER): Payer: Medicare Other | Admitting: *Deleted

## 2017-08-12 ENCOUNTER — Telehealth: Payer: Self-pay | Admitting: Cardiology

## 2017-08-12 DIAGNOSIS — I442 Atrioventricular block, complete: Secondary | ICD-10-CM | POA: Diagnosis not present

## 2017-08-12 NOTE — Progress Notes (Signed)
Remote pacemaker transmission.   

## 2017-08-12 NOTE — Telephone Encounter (Signed)
Spoke with pt and reminded pt of remote transmission that is due today. Pt verbalized understanding.   

## 2017-08-13 ENCOUNTER — Encounter: Payer: Self-pay | Admitting: Cardiology

## 2017-08-13 MED FILL — SM ALLERGY RELIEF 50 MCG SP: 50 MCG | 30 days supply | Qty: 16 | Fill #0

## 2017-08-13 MED FILL — LORATADINE 10 MG TABLET: 10 | 100 days supply | Qty: 100 | Fill #2

## 2017-08-21 LAB — CUP PACEART REMOTE DEVICE CHECK
Battery Voltage: 2.78 V
Brady Statistic AP VS Percent: 0 %
Brady Statistic AS VS Percent: 0 %
Date Time Interrogation Session: 20190212205541
Implantable Lead Location: 753860
Lead Channel Pacing Threshold Pulse Width: 0.4 ms
Lead Channel Pacing Threshold Pulse Width: 0.4 ms
Lead Channel Setting Pacing Pulse Width: 0.4 ms
Lead Channel Setting Sensing Sensitivity: 4 mV
MDC IDC LEAD IMPLANT DT: 20041110
MDC IDC LEAD IMPLANT DT: 20041110
MDC IDC LEAD LOCATION: 753859
MDC IDC MSMT BATTERY IMPEDANCE: 669 Ohm
MDC IDC MSMT BATTERY REMAINING LONGEVITY: 69 mo
MDC IDC MSMT LEADCHNL RA IMPEDANCE VALUE: 551 Ohm
MDC IDC MSMT LEADCHNL RA PACING THRESHOLD AMPLITUDE: 0.5 V
MDC IDC MSMT LEADCHNL RV IMPEDANCE VALUE: 410 Ohm
MDC IDC MSMT LEADCHNL RV PACING THRESHOLD AMPLITUDE: 1.125 V
MDC IDC PG IMPLANT DT: 20130318
MDC IDC SET LEADCHNL RA PACING AMPLITUDE: 2 V
MDC IDC SET LEADCHNL RV PACING AMPLITUDE: 2.5 V
MDC IDC STAT BRADY AP VP PERCENT: 1 %
MDC IDC STAT BRADY AS VP PERCENT: 99 %

## 2017-09-03 ENCOUNTER — Other Ambulatory Visit (INDEPENDENT_AMBULATORY_CARE_PROVIDER_SITE_OTHER): Payer: Medicare Other

## 2017-09-03 DIAGNOSIS — I10 Essential (primary) hypertension: Secondary | ICD-10-CM | POA: Diagnosis not present

## 2017-09-03 DIAGNOSIS — E7800 Pure hypercholesterolemia, unspecified: Secondary | ICD-10-CM

## 2017-09-03 LAB — COMPREHENSIVE METABOLIC PANEL
ALBUMIN: 4.2 g/dL (ref 3.5–5.2)
ALK PHOS: 41 U/L (ref 39–117)
ALT: 12 U/L (ref 0–35)
AST: 20 U/L (ref 0–37)
BILIRUBIN TOTAL: 0.7 mg/dL (ref 0.2–1.2)
BUN: 19 mg/dL (ref 6–23)
CALCIUM: 9.7 mg/dL (ref 8.4–10.5)
CO2: 28 meq/L (ref 19–32)
CREATININE: 1.1 mg/dL (ref 0.40–1.20)
Chloride: 98 mEq/L (ref 96–112)
GFR: 61.61 mL/min (ref >60.00)
Glucose, Bld: 88 mg/dL (ref 70–99)
Potassium: 4 mEq/L (ref 3.5–5.1)
Sodium: 134 mEq/L — ABNORMAL LOW (ref 135–145)
TOTAL PROTEIN: 6.9 g/dL (ref 6.0–8.3)

## 2017-09-03 LAB — LIPID PANEL
CHOLESTEROL: 147 mg/dL (ref 0–200)
HDL: 50.8 mg/dL (ref >39.00)
LDL Cholesterol: 85 mg/dL (ref 0–99)
NonHDL: 96.17
TRIGLYCERIDES: 56 mg/dL (ref 0.0–149.0)
Total CHOL/HDL Ratio: 3
VLDL: 11.2 mg/dL (ref 0.0–40.0)

## 2017-09-08 ENCOUNTER — Encounter: Payer: Self-pay | Admitting: Internal Medicine

## 2017-09-10 ENCOUNTER — Encounter: Payer: Self-pay | Admitting: Internal Medicine

## 2017-09-10 ENCOUNTER — Ambulatory Visit (INDEPENDENT_AMBULATORY_CARE_PROVIDER_SITE_OTHER): Payer: Medicare Other | Admitting: Internal Medicine

## 2017-09-10 VITALS — BP 126/68 | HR 79 | Temp 98.2°F | Resp 14 | Ht 63.0 in | Wt 139.2 lb

## 2017-09-10 DIAGNOSIS — M778 Other enthesopathies, not elsewhere classified: Secondary | ICD-10-CM

## 2017-09-10 MED ORDER — DICLOFENAC SODIUM 1 % TD GEL: 2.0000 g | Freq: Four times a day (QID) | TRANSDERMAL | 1 refills | Status: DC

## 2017-09-10 NOTE — Patient Instructions (Signed)
Ice  Tylenol  500 mg OTC 2 tabs a day every 8 hours as needed for pain  Voltaren gel 3 or 4 tines a day as needed  Call if no better

## 2017-09-10 NOTE — Progress Notes (Signed)
Subjective:    Patient ID: [name], female    DOB: [birth year], 77 y.o.   MRN: [id number]  DOS:  09/10/2017 Type of visit - description : Acute visit Interval history: Several months history of on and off pain at the base of the left thumb, pain is persistent at times but worse when she uses her thumb. She is here because in addition to above, has noted a knot at the base of the thumb that is very tender. She was seen in October with similar symptoms, x-ray, sed rate, uric acid were all normal. She took prednisone temporarily with some relief.  Review of Systems Denies any fever chills No injury or overuse The area of concerns have never been red or swollen.   Past Medical History:  Diagnosis Date  . Atrioventricular block, complete ([place])    a. 08/2011 Upgrage of PPM to MDT Adapta L Dual Chamber PPM ser # [id number].  . Cardiac pacemaker in situ   . Contact dermatitis and eczema    unspec cause  . Dairy product intolerance   . Depression   . Diastolic dysfunction   . GERD (gastroesophageal reflux disease)   . Hiatal hernia   . History of CHF (congestive heart failure)   . History of diverticulitis of colon    2003  perforated diverticulitis w/ surgical intervention  . History of kidney stones    age 78  . History of peripheral edema    lower extremities  . History of thyroid nodule    multinodular goiter  s/p  total thyroidectomy 2014  . HTN (hypertension)   . Hydronephrosis, left   . Hypothyroidism   . Mild sleep apnea    per study 04-14-2014  . Nocturia   . Osteoporosis   . PAF (paroxysmal atrial fibrillation) ([place])   . PONV (postoperative nausea and vomiting)   . Pulmonary nodules    stable per last ct  . Renal cell carcinoma of left kidney (HCC)   . Shortness of breath    intermittant  . Superficial thrombophlebitis     Past Surgical History:  Procedure Laterality Date  . CARDIOVASCULAR STRESS TEST  04-20-2015  dr Caryl Comes   normal nuclear study/  no  ischemia/  normal LV function and wall motion, ef 74%  . COLONOSCOPY  01/19/2013  . COLOSTOMY TAKEDOWN  07-22-2002  . CYSTOSCOPY WITH RETROGRADE PYELOGRAM, URETEROSCOPY AND STENT PLACEMENT Left 07/28/2015   Procedure: CYSTOSCOPY WITH LEFT RETROGRADE PYELOGRAM, POSSIBLE LEFT URETEROSCOPY AND STENT PLACEMENT;  Surgeon: Franchot Gallo, MD;  [facility];  Service: Urology;  Laterality: Left;  . EXPLORATORY LAPARTOMY SIGMOID COLECTOMY/ COLOSTOMY  04-28-2002   perforated diverticulitis  . LAPAROSCOPY VENTRAL HERNIA REPAIR/  EXTENSIVE LYSIS ADHESIONS  05-04-2004  . PACEMAKER GENERATOR CHANGE Left 09/16/2011   MDT [place] pacemaker  . PACEMAKER PLACEMENT  05-11-2003     medtronic  . ROBOT ASSISTED LAPAROSCOPIC NEPHRECTOMY Left 03/20/2016   Procedure: XI ROBOTIC ASSISTED LAPAROSCOPIC RETROPERITONEAL NEPHRECTOMY;  Surgeon: Alexis Frock, MD;  Location: [facility];  Service: Urology;  Laterality: Left;  . THYROIDECTOMY N/A 08/07/2012   Procedure: THYROIDECTOMY;  Surgeon: Earnstine Regal, MD;  Location: [facility];  Service: General;  Laterality: N/A;  . TRANSTHORACIC ECHOCARDIOGRAM  10-12-2014   moderate focal basal LVH,  ef 55-60%,  grade 2 diastolic dysfunction, mild AV calcification without stenosis,  mild MR,  trivial PR    Social History   Socioeconomic History  . Marital status: Single  Spouse name: Not on file  . Number of children: 2  . Years of education: 8  . Highest education level: Not on file  Social Needs  . Financial resource strain: Not on file  . Food insecurity - worry: Not on file  . Food insecurity - inability: Not on file  . Transportation needs - medical: Not on file  . Transportation needs - non-medical: Not on file  Occupational History  . 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 Smoking status: Former Smoker    Years: 15.00    Types: Cigarettes    Last attempt to quit: 10/06/1979    Years  since quitting: 37.9  . Smokeless tobacco: Never Used  Substance and Sexual Activity  . Alcohol use: No  . Drug use: No  . Sexual activity: Not on file  Other Topics Concern  . 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  She was married for 22 years, divorced, and has remained single. She has 1 grown son, 1 grown daughter.  She has 3 grandchildren, 2 of her children   live in [place].  She is retired.  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      Allergies as of 09/10/2017      Reactions   Tape Rash   Codeine Nausea And Vomiting      Medication List        Accurate as of 09/10/17 11:59 PM. Always use your most recent med list.          acetaminophen 500 MG tablet Commonly known as:  TYLENOL Take 1,000 mg by mouth daily as needed for moderate pain.   amLODipine 5 MG tablet Commonly known as:  NORVASC TAKE 1 TABLET (5 MG TOTAL) BY MOUTH DAILY.   apixaban 5 MG Tabs tablet Commonly known as:  ELIQUIS Take 1 tablet (5 mg total) by mouth 2 (two) times daily.   diclofenac sodium 1 % Gel Commonly known as:  VOLTAREN Apply 2 g topically 4 (four) times daily.   diphenhydramine-acetaminophen 25-500 MG Tabs tablet Commonly known as:  TYLENOL PM Take 2 tablets by mouth at bedtime as needed (sleep/pain).   fluticasone 50 MCG/ACT nasal spray Commonly known as:  FLONASE Place 2 sprays into both nostrils daily as needed for allergies or rhinitis.   levocetirizine 5 MG tablet Commonly known as:  XYZAL Take 1 tablet (5 mg total) by mouth every evening.   levothyroxine 88 MCG tablet Commonly known as:  SYNTHROID, LEVOTHROID TAKE ONE TABLET BY MOUTH ONCE DAILY BEFORE  BREAKFAST   lisinopril-hydrochlorothiazide 20-12.5 MG tablet Commonly known as:  PRINZIDE,ZESTORETIC TAKE 2 TABLETS  BY MOUTH DAILY.   loratadine 10 MG tablet Commonly known as:  CLARITIN Take 1 tablet (10 mg total) by mouth daily as needed for allergies.   omeprazole 20 MG capsule Commonly known as:  PRILOSEC Take 1 capsule (20 mg total) by mouth daily as needed (for heartburn and indigestion).   PROLIA 60 MG/ML Soln injection Generic drug:  denosumab Inject 60 mg into the skin every 6 (six) months. Reported on 09/07/2015   traMADol 50 MG tablet Commonly known as:  ULTRAM Take 1 tablet (50 mg total) by mouth  every 8 (eight) hours as needed.          Objective:   Physical Exam  Musculoskeletal:       Hands:  BP 126/68 (BP Location: Right Arm, Patient Position: Sitting, Cuff Size: Small)   Pulse 79   Temp 98.2 F (36.8 C) (Oral)   Resp 14   Ht 5\' 3"  (1.6 m)   Wt 139 lb 4 oz (63.2 kg)   SpO2 98%   BMI 24.67 kg/m  General:   Well developed, well nourished . NAD.  HEENT:  Normocephalic . Face symmetric, atraumatic MSK: Right hand and wrist normal Left hand and wrist: No synovitis on the knuckles or fingers except for TTP @ base of the thumb.  See picture and graphic. Skin: Not pale. Not jaundice Neurologic:  alert & oriented X3.  Speech normal, gait appropriate for age and unassisted Psych--  Cognition and judgment appear intact.  Cooperative with normal attention span and concentration.  Behavior appropriate. No anxious or depressed appearing.   She has a small knot, see below        Assessment & Plan:    78 year old female with multiple medical problems including depression, hyperlipidemia, hypothyroidism, osteoporosis, anticoagulated due to paroxysmal A. fib, presents with  Tendinitis: Persistent tendinitis, left hand, she is anticoagulated, recommend avoidance of oral NSAIDs. We discussed a referral to Ortho but elected to wait for now. We will treat with ice, topical Voltaren, Tylenol.  Will call if not better for a referral.

## 2017-09-10 NOTE — Progress Notes (Signed)
Pre visit review using our clinic review tool, if applicable. No additional management support is needed unless otherwise documented below in the visit note. 

## 2017-09-15 ENCOUNTER — Encounter: Payer: Self-pay | Admitting: Internal Medicine

## 2017-09-23 NOTE — Progress Notes (Signed)
Electrophysiology Office Note Date: 09/30/2017  ID:  Daisy, [name] [birth year], MRN [id number]  PCP: Carollee Herter, Alferd Apa, DO Primary Cardiologist: Burt Knack Electrophysiologist: Caryl Comes  CC: Pacemaker follow-up  [name] is a 77 y.o. female seen today for Dr Caryl Comes.  She presents today for routine electrophysiology followup.  Since last being seen in our clinic, the patient reports doing very well.  She denies chest pain, palpitations, dyspnea, PND, orthopnea, nausea, vomiting, dizziness, syncope, edema, weight gain, or early satiety.  Device History: MDT dual chamber PPM implanted 2004 for CHB with generator change 2013.     Past Medical History:  Diagnosis Date  . Atrioventricular block, complete ([place])    a. 08/2011 Upgrage of PPM to MDT [place] Dual Chamber PPM ser # [id number].  . Cardiac pacemaker in situ   . Contact dermatitis and eczema    unspec cause  . Dairy product intolerance   . Depression   . Diastolic dysfunction   . GERD (gastroesophageal reflux disease)   . Hiatal hernia   . History of CHF (congestive heart failure)   . History of diverticulitis of colon    2003  perforated diverticulitis w/ surgical intervention  . History of kidney stones    age 45  . History of peripheral edema    lower extremities  . History of thyroid nodule    multinodular goiter  s/p  total thyroidectomy 2014  . HTN (hypertension)   . Hydronephrosis, left   . Hypothyroidism   . Mild sleep apnea    per study 04-14-2014  . Nocturia   . Osteoporosis   . PAF (paroxysmal atrial fibrillation) ([place])   . PONV (postoperative nausea and vomiting)   . Pulmonary nodules    stable per last ct  . Renal cell carcinoma of left kidney (HCC)   . Shortness of breath    intermittant  . Superficial thrombophlebitis    Past Surgical History:  Procedure Laterality Date  . CARDIOVASCULAR STRESS TEST  04-20-2015  dr Caryl Comes   normal nuclear study/  no ischemia/  normal LV function and  wall motion, ef 74%  . COLONOSCOPY  01/19/2013  . COLOSTOMY TAKEDOWN  07-22-2002  . CYSTOSCOPY WITH RETROGRADE PYELOGRAM, URETEROSCOPY AND STENT PLACEMENT Left 07/28/2015   Procedure: CYSTOSCOPY WITH LEFT RETROGRADE PYELOGRAM, POSSIBLE LEFT URETEROSCOPY AND STENT PLACEMENT;  Surgeon: Franchot Gallo, MD;  Location: [facility];  Service: Urology;  Laterality: Left;  . EXPLORATORY LAPARTOMY SIGMOID COLECTOMY/ COLOSTOMY  04-28-2002   perforated diverticulitis  . LAPAROSCOPY VENTRAL HERNIA REPAIR/  EXTENSIVE LYSIS ADHESIONS  05-04-2004  . PACEMAKER GENERATOR CHANGE Left 09/16/2011   MDT [place] pacemaker  . PACEMAKER PLACEMENT  05-11-2003     medtronic  . ROBOT ASSISTED LAPAROSCOPIC NEPHRECTOMY Left 03/20/2016   Procedure: XI ROBOTIC ASSISTED LAPAROSCOPIC RETROPERITONEAL NEPHRECTOMY;  Surgeon: Alexis Frock, MD;  Location: [facility];  Service: Urology;  Laterality: Left;  . THYROIDECTOMY N/A 08/07/2012   Procedure: THYROIDECTOMY;  Surgeon: Earnstine Regal, MD;  Location: [facility];  Service: General;  Laterality: N/A;  . TRANSTHORACIC ECHOCARDIOGRAM  10-12-2014   moderate focal basal LVH,  ef 55-60%,  grade 2 diastolic dysfunction, mild AV calcification without stenosis,  mild MR,  trivial PR    Current Outpatient Medications  Medication Sig Dispense Refill  . acetaminophen (TYLENOL) 500 MG tablet Take 1,000 mg by mouth daily as needed for moderate pain.    [name] amLODipine (NORVASC) 5 MG tablet TAKE 1 TABLET (5  MG TOTAL) BY MOUTH DAILY. 90 tablet 2  . apixaban (ELIQUIS) 5 MG TABS tablet Take 1 tablet (5 mg total) by mouth 2 (two) times daily. 180 tablet 3  . denosumab (PROLIA) 60 MG/ML SOLN Inject 60 mg into the skin every 6 (six) months. Reported on 09/07/2015    . diclofenac sodium (VOLTAREN) 1 % GEL Apply 2 g topically 4 (four) times daily. 100 g 1  . diphenhydramine-acetaminophen (TYLENOL PM) 25-500 MG TABS tablet Take 2 tablets by mouth at bedtime as needed (sleep/pain).    . fluticasone  (FLONASE) 50 MCG/ACT nasal spray Place 2 sprays into both nostrils daily as needed for allergies or rhinitis. 16 g 6  . levocetirizine (XYZAL) 5 MG tablet Take 1 tablet (5 mg total) by mouth every evening. 30 tablet 3  . levothyroxine (SYNTHROID, LEVOTHROID) 88 MCG tablet TAKE ONE TABLET BY MOUTH ONCE DAILY BEFORE  BREAKFAST 90 tablet 3  . lisinopril-hydrochlorothiazide (PRINZIDE,ZESTORETIC) 20-12.5 MG tablet TAKE 2 TABLETS BY MOUTH DAILY. 180 tablet 1  . loratadine (CLARITIN) 10 MG tablet Take 1 tablet (10 mg total) by mouth daily as needed for allergies. 90 tablet 2  . omeprazole (PRILOSEC) 20 MG capsule Take 1 capsule (20 mg total) by mouth daily as needed (for heartburn and indigestion). 30 capsule 11  . traMADol (ULTRAM) 50 MG tablet Take 1 tablet (50 mg total) by mouth every 8 (eight) hours as needed. 30 tablet 0   No current facility-administered medications for this visit.    Facility-Administered Medications Ordered in Other Visits  Medication Dose Route Frequency Provider Last Rate Last Dose  . aminophylline injection 150 mg  150 mg Intravenous BID PRN Dorothy Spark, MD   150 mg at 04/20/15 1200    Allergies:   Tape and Codeine   Social History: Social History   Socioeconomic History  . Marital status: Single    Spouse name: Not on file  . Number of children: 2  . Years of education: 72  . Highest education level: Not on file  Occupational History  . 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 Financial resource strain: Not on file  . Food insecurity:    Worry: Not on file    Inability: Not on file  . Transportation needs:    Medical: Not on file    Non-medical: Not on file  Tobacco Use  . Smoking status: Former Smoker    Years: 15.00    Types: Cigarettes    Last attempt to quit: 10/06/1979    Years since quitting: 38.0  . Smokeless tobacco: Never Used  Substance and Sexual Activity  . Alcohol use: No    . Drug use: No  . Sexual activity: Not on file  Lifestyle  . Physical activity:    Days per week: Not on file    Minutes per session: Not on file  . Stress: Not on file  Relationships  . Social connections:    Talks on phone: Not on file    Gets together: Not on file    Attends religious service: Not on file    Active member of club or organization: Not on file    Attends meetings of clubs or organizations: Not on file    Relationship status: Not on file  . Intimate partner violence:    Fear of current or ex partner: Not on file    Emotionally abused: Not on file  Physically abused: Not on file    Forced sexual activity: Not on file  Other Topics Concern  . 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  She was married for 22 years, divorced, and has remained single. She has 1 grown son, 1 grown daughter.  She has 3 grandchildren, 2 of her children   live in [place].  She is retired.  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    Family History: Family History  Problem Relation Age of Onset  . Heart disease Mother   . Stroke Mother   . Hypertension Mother   . Heart failure Father   . Hypertension Brother   . Heart disease Brother   . Diverticulosis Sister   . Hypertension Brother   . Cancer Son        breast  . Heart attack Neg Hx      Review of Systems: All other systems reviewed and are otherwise negative except as noted above.   Physical Exam: VS:  BP 128/68   Pulse 86   Ht 5\' 3"  (1.6 m)   Wt 141 lb (64 kg)   SpO2 99%   BMI 24.98 kg/m  , BMI Body mass index is 24.98 kg/m.  GEN- The patient is well appearing, alert and oriented x 3 today.   HEENT: normocephalic, atraumatic; sclera clear, conjunctiva pink; hearing intact; oropharynx clear; neck supple  Lungs-  Clear to ausculation bilaterally, normal work of breathing.  No wheezes, rales, rhonchi Heart- Regular rate and rhythm (paced) GI- soft, non-tender, non-distended, bowel sounds present  Extremities- no clubbing, cyanosis, or edema  MS- no significant deformity or atrophy Skin- warm and dry, no rash or lesion; PPM pocket well healed Psych- euthymic mood, full affect Neuro- strength and sensation are intact  PPM Interrogation- reviewed in detail today,  See PACEART report  EKG:  EKG is not ordered today.  Recent Labs: 06/05/2017: TSH 1.14 09/03/2017: ALT 12; BUN 19; Creatinine, Ser 1.10; Potassium 4.0; Sodium 134   Wt Readings from Last 3 Encounters:  09/30/17 141 lb (64 kg)  09/10/17 139 lb 4 oz (63.2 kg)  06/03/17 137 lb 6.4 oz (62.3 kg)     Other studies Reviewed: Additional studies/ records that were reviewed today include: prior cardiology office notes   Assessment and Plan:  1.  Complete heart block Normal PPM function See Pace Art report No changes today  2.  Paroxysmal atrial fibrillation Burden by device interrogation 0.1% V rates controlled Continue Eliquis for CHADS2VASC of 4  3.  HTN Stable No change required today    Current medicines are reviewed at length with the patient today.   The patient does not have concerns regarding her medicines.  The following changes were made today:  none  Labs/ tests ordered today include:  Orders Placed This Encounter  Procedures  . CUP PACEART [place]  . EKG 12-Lead     Disposition:   Follow up with [facility], Dr Caryl Comes 1 year (needs to be with Dr Caryl Comes - today's appointment was supposed to have been with him)    Signed, Chanetta Marshall, NP 09/30/2017 11:48 AM  [place] [address] [place] [address] [phone] (office) [phone] (fax)

## 2017-09-24 ENCOUNTER — Encounter: Payer: Medicare Other | Admitting: Internal Medicine

## 2017-09-30 ENCOUNTER — Encounter: Payer: Self-pay | Admitting: Nurse Practitioner

## 2017-09-30 ENCOUNTER — Ambulatory Visit (INDEPENDENT_AMBULATORY_CARE_PROVIDER_SITE_OTHER): Payer: Medicare Other | Admitting: Nurse Practitioner

## 2017-09-30 VITALS — BP 128/68 | HR 86 | Ht 63.0 in | Wt 141.0 lb

## 2017-09-30 DIAGNOSIS — I442 Atrioventricular block, complete: Secondary | ICD-10-CM

## 2017-09-30 DIAGNOSIS — I480 Paroxysmal atrial fibrillation: Secondary | ICD-10-CM

## 2017-09-30 DIAGNOSIS — I10 Essential (primary) hypertension: Secondary | ICD-10-CM

## 2017-09-30 LAB — CUP PACEART INCLINIC DEVICE CHECK
Implantable Lead Implant Date: 20041110
Implantable Lead Location: 753860
MDC IDC LEAD IMPLANT DT: 20041110
MDC IDC LEAD LOCATION: 753859
MDC IDC PG IMPLANT DT: 20130318
MDC IDC SESS DTM: 20190402114709

## 2017-09-30 NOTE — Patient Instructions (Addendum)
Medication Instructions:   Your physician recommends that you continue on your current medications as directed. Please refer to the Current Medication list given to you today.   If you need a refill on your cardiac medications before your next appointment, please call your pharmacy.  Labwork: NONE ORDERED  TODAY    Testing/Procedures: NONE ORDERED  TODAY    Follow-Up:   Your physician wants you to follow-up in: [place] will receive a reminder letter in the mail two months in advance. If you don't receive a letter, please call our office to schedule the follow-up appointment.   Remote monitoring is used to monitor your Pacemaker of ICD from home. This monitoring reduces the number of office visits required to check your device to one time per year. It allows [place] to keep an eye on the functioning of your device to ensure it is working properly. You are scheduled for a device check from home on . 11-11-17 You may send your transmission at any time that day. If you have a wireless device, the transmission will be sent automatically. After your physician reviews your transmission, you will receive a postcard with your next transmission date.     Any Other Special Instructions Will Be Listed Below (If Applicable).

## 2017-10-02 MED FILL — SM ALLERGY RELIEF 50 MCG SP: 50 MCG | 30 days supply | Qty: 16 | Fill #1

## 2017-10-06 ENCOUNTER — Ambulatory Visit (INDEPENDENT_AMBULATORY_CARE_PROVIDER_SITE_OTHER): Payer: Medicare Other | Admitting: Internal Medicine

## 2017-10-06 ENCOUNTER — Encounter: Payer: Self-pay | Admitting: Internal Medicine

## 2017-10-06 VITALS — BP 116/74 | HR 83 | Ht 63.25 in | Wt 138.6 lb

## 2017-10-06 DIAGNOSIS — M810 Age-related osteoporosis without current pathological fracture: Secondary | ICD-10-CM | POA: Diagnosis not present

## 2017-10-06 LAB — VITAMIN D 25 HYDROXY (VIT D DEFICIENCY, FRACTURES): VITD: 29.82 ng/mL — ABNORMAL LOW (ref 30.00–100.00)

## 2017-10-06 NOTE — Patient Instructions (Addendum)
We will try to switch to get Prolia here in the office.  Please stop at the lab.  Let me know about the vitamin D dose.  Please make sure you get 1200 mg calcium ideally from the diet.  Please come back for a follow-up appointment in 1 year.  Check out the book:   How Can I Prevent Falls? Men and women with osteoporosis need to take care not to fall down. Falls can break bones. Some reasons people fall are: Poor vision  Poor balance  Certain diseases that affect how you walk  Some types of medicine, such as sleeping pills.  Some tips to help prevent falls outdoors are: Use a cane or walker  Wear rubber-soled shoes so you don't slip  Walk on grass when sidewalks are slippery  In winter, put salt or kitty litter on icy sidewalks.  Some ways to help prevent falls indoors are: Keep rooms free of clutter, especially on floors  Use plastic or carpet runners on slippery floors  Wear low-heeled shoes that provide good support  Do not walk in socks, stockings, or slippers  Be sure carpets and area rugs have skid-proof backs or are tacked to the floor  Be sure stairs are well lit and have rails on both sides  Put grab bars on bathroom walls near tub, shower, and toilet  Use a rubber bath mat in the shower or tub  Keep a flashlight next to your bed  Use a sturdy step stool with a handrail and wide steps  Add more lights in rooms (and night lights) Buy a cordless phone to keep with you so that you don't have to rush to the phone       when it rings and so that you can call for help if you fall.   (adapted from http://www.niams.NightlifePreviews.se)    Exercise for Strong Bones (from [place]) There are two types of exercises that are important for building and maintaining bone density:  weight-bearing and muscle-strengthening exercises. Weight-bearing Exercises These exercises include activities that make you move against  gravity while staying upright. Weight-bearing exercises can be high-impact or low-impact. High-impact weight-bearing exercises help build bones and keep them strong. If you have broken a bone due to osteoporosis or are at risk of breaking a bone, you may need to avoid high-impact exercises. If you're not sure, you should check with your healthcare provider. Examples of high-impact weight-bearing exercises are: . Dancing . Doing high-impact aerobics . Hiking . Jogging/running . Jumping Rope . Stair climbing . Tennis Low-impact weight-bearing exercises can also help keep bones strong and are a safe alternative if you cannot do high-impact exercises. Examples of low-impact weight-bearing exercises are: . Using elliptical training machines . Doing low-impact aerobics . Using stair-step machines . Fast walking on a treadmill or outside Muscle-Strengthening Exercises These exercises include activities where you move your body, a weight or some other resistance against gravity. They are also known as resistance exercises and include: . Lifting weights . Using elastic exercise bands . Using weight machines . Lifting your own body weight . Functional movements, such as standing and rising up on your toes Yoga and Pilates can also improve strength, balance and flexibility. However, certain positions may not be safe for people with osteoporosis or those at increased risk of broken bones. For example, exercises that have you bend forward may increase the chance of breaking a bone in the spine. A physical therapist should be able to help you  learn which exercises are safe and appropriate for you. Non-Impact Exercises Non-impact exercises can help you to improve balance, posture and how well you move in everyday activities. These exercises can also help to increase muscle strength and decrease the risk of falls and broken bones. Some of these exercises include: . Balance exercises that strengthen your  legs and test your balance, such as Tai Chi, can decrease your risk of falls. . Posture exercises that improve your posture and reduce rounded or "sloping" shoulders can help you decrease the chance of breaking a bone, especially in the spine. . Functional exercises that improve how well you move can help you with everyday activities and decrease your chance of falling and breaking a bone. For example, if you have trouble getting up from a chair or climbing stairs, you should do these activities as exercises. A physical therapist can teach you balance, posture and functional exercises. Starting a New Exercise Program If you haven't exercised regularly for a while, check with your healthcare provider before beginning a new exercise program-particularly if you have health problems such as heart disease, diabetes or high blood pressure. If you're at high risk of breaking a bone, you should work with a physical therapist to develop a safe exercise program. Once you have your healthcare provider's approval, start slowly. If you've already broken bones in the spine because of osteoporosis, be very careful to avoid activities that require reaching down, bending forward, rapid twisting motions, heavy lifting and those that increase your chance of a fall. As you get started, your muscles may feel sore for a day or two after you exercise. If soreness lasts longer, you may be working too hard and need to ease up. Exercises should be done in a pain-free range of motion.  How Much Exercise Do You Need? Weight-bearing exercises 30 minutes on most days of the week. Do a 30-minutesession or multiple sessions spread out throughout the day. The benefits to your bones are the same.   Muscle-strengthening exercises Two to three days per week. If you don't have much time for strengthening/resistance training, do small amounts at a time. You can do just one body part each day. For example do arms one day, legs the next and  trunk the next. You can also spread these exercises out during your normal day.  Balance, posture and functional exercises Every day or as often as needed. You may want to focus on one area more than the others. If you have fallen or lose your balance, spend time doing balance exercises. If you are getting rounded shoulders, work more on posture exercises. If you have trouble climbing stairs or getting up from the couch, do more functional exercises. You can also perform these exercises at one time or spread them during your day. Work with a phyiscal therapist to learn the right exercises for you.

## 2017-10-06 NOTE — Progress Notes (Signed)
Patient ID: [name], female   DOB: 12/03/1940, 77 y.o.   MRN: [id number]    HPI  [name] is a 77 y.o.-year-old female, referred by her PCP, Dr. Etter Sjogren, for management of osteoporosis.  Pt was dx with OP in in 1999-2000 [place].  I reviewed pt's DEXA scans: Date L1-L4 T score FN T score 33% distal Radius Ultra distal radius  05/29/2017 [facility]) N/a (multiple compression fractures) LFN: -3.1 (+0.5%) RFN: -2.9 -3.2 (-1.0%) -4.6  05/29/2015 ([facility]) L1, L3: -2.7 LFN: -3.1 -3.1 n/a  06/04/2012 L1-L4: -3.4 LFN: -2.2 RFN: -2.2 n/a n/a   Fractures: 7 vertebral fxs - 6 kyphoplasties: Reviewed CT Lumbar spine report from 2016: Remote compression fractures with cement augmentation T11, T12, L2, L4 and L5. Tiny amount of cement seen adjacent to the right L4 and L5 nerve roots. Subacute superior endplate compression deformity L1 and L3.  OP treatments:  - Fosamax  - 2 years >> ineffective - Forteo - 2 years  - 2003 - Prolia - started 2012 (total of 7 years - however, only 1 documented inj/year in 2012, 2014, 2015 and no doses documented in 2017), last 03/2017:   + dizziness 2/2 allergies, but no vertigo/orthostasis/poor vision. No falls.  + h/o vitamin D deficiency. Reviewed available vit D levels: Lab Results  Component Value Date   VD25OH 32 07/04/2009   VD25OH 24 (L) 02/14/2009   Pt is on calcium + vitamin D 500 mg - 200 IU TID supplements.   No weight bearing exercises.   She does not take high vitamin A doses.  No steroid inj.  Menopause was early, at 36-43 y/o.   Pt does have a FH of osteoporosis: mother - fx'ed ankle, foot.  No h/o hyper/hypocalcemia or hyperparathyroidism. No h/o kidney stones. Lab Results  Component Value Date   CALCIUM 9.7 09/03/2017   CALCIUM 9.1 06/05/2017   CALCIUM 9.6 12/02/2016   CALCIUM 9.7 08/15/2016   CALCIUM 9.9 06/07/2016   CALCIUM 8.9 03/22/2016   CALCIUM 9.3 03/21/2016   CALCIUM 9.7 03/12/2016   CALCIUM 8.9 12/13/2015   CALCIUM 10.0 09/07/2015   No h/o thyrotoxicosis. She has a h/o thyroid nodules >> now s/p total thyroidectomy, on LT4. Reviewed TSH recent levels:  Lab Results  Component Value Date   TSH 1.14 06/05/2017   TSH 1.770 08/13/2016   TSH 1.38 09/07/2015   TSH 2.33 09/08/2014   TSH 3.580 10/15/2013   No h/o CKD. Last BUN/Cr: Lab Results  Component Value Date   BUN 19 09/03/2017   CREATININE 1.10 09/03/2017   She also has a h/o HTN.  ROS: Constitutional: no weight gain/loss, no fatigue, no subjective hyperthermia/hypothermia, + poor sleep, + nocturia Eyes: no blurry vision, no xerophthalmia ENT: no sore throat, no nodules palpated in throat, no dysphagia/odynophagia, no hoarseness Cardiovascular: no CP/+ SOB/no palpitations/leg swelling Respiratory: no cough/+ SOB Gastrointestinal: no N/V/D/C/+ heartburn Musculoskeletal: + muscle/no joint aches Skin: no rashes, + hair loss, + easy bruising Neurological: no tremors/numbness/tingling/dizziness Psychiatric: no depression/anxiety  Past Medical History:  Diagnosis Date  . Atrioventricular block, complete ([place])    a. 08/2011 Upgrage of PPM to MDT Adapta L Dual Chamber PPM ser # [id number].  . Cardiac pacemaker in situ   . Contact dermatitis and eczema    unspec cause  . Dairy product intolerance   . Depression   . Diastolic dysfunction   . GERD (gastroesophageal reflux disease)   . Hiatal hernia   .  History of CHF (congestive heart failure)   . History of diverticulitis of colon    2003  perforated diverticulitis w/ surgical intervention  . History of kidney stones    age 77  . History of peripheral edema    lower extremities  . History of thyroid nodule    multinodular goiter  s/p  total thyroidectomy 2014  . HTN (hypertension)   . Hydronephrosis, left   . Hypothyroidism   . Mild sleep apnea    per study 04-14-2014  . Nocturia   . Osteoporosis   . PAF (paroxysmal atrial fibrillation) ([place])   .  PONV (postoperative nausea and vomiting)   . Pulmonary nodules    stable per last ct  . Renal cell carcinoma of left kidney (H[facility])   . Shortness of breath    intermittant  . Superficial thrombophlebitis    Past Surgical History:  Procedure Laterality Date  . CARDIOVASCULAR STRESS TEST  04-20-2015  dr Caryl Comes   normal nuclear study/  no ischemia/  normal LV function and wall motion, ef 74%  . COLONOSCOPY  01/19/2013  . COLOSTOMY TAKEDOWN  07-22-2002  . CYSTOSCOPY WITH RETROGRADE PYELOGRAM, URETEROSCOPY AND STENT PLACEMENT Left 07/28/2015   Procedure: CYSTOSCOPY WITH LEFT RETROGRADE PYELOGRAM, POSSIBLE LEFT URETEROSCOPY AND STENT PLACEMENT;  Surgeon: Franchot Gallo, MD;  [facility];  Service: Urology;  Laterality: Left;  . EXPLORATORY LAPARTOMY SIGMOID COLECTOMY/ COLOSTOMY  04-28-2002   perforated diverticulitis  . LAPAROSCOPY VENTRAL HERNIA REPAIR/  EXTENSIVE LYSIS ADHESIONS  05-04-2004  . PACEMAKER GENERATOR CHANGE Left 09/16/2011   MDT Black pacemaker  . PACEMAKER PLACEMENT  05-11-2003     medtronic  . ROBOT ASSISTED LAPAROSCOPIC NEPHRECTOMY Left 03/20/2016   Procedure: XI ROBOTIC ASSISTED LAPAROSCOPIC RETROPERITONEAL NEPHRECTOMY;  Surgeon: Alexis Frock, MD;  Location: [facility];  Service: Urology;  Laterality: Left;  . THYROIDECTOMY N/A 08/07/2012   Procedure: THYROIDECTOMY;  Surgeon: Earnstine Regal, MD;  Location: [facility];  Service: General;  Laterality: N/A;  . TRANSTHORACIC ECHOCARDIOGRAM  10-12-2014   moderate focal basal LVH,  ef 55-60%,  grade 2 diastolic dysfunction, mild AV calcification without stenosis,  mild MR,  trivial PR   Social History   Socioeconomic History  . Marital status: Single    Spouse name: Not on file  . Number of children: 2  . Years of education: 4  . Highest education level: Not on file  Occupational History  . 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 Financial  resource strain: Not on file  . Food insecurity:    Worry: Not on file    Inability: Not on file  . Transportation needs:    Medical: Not on file    Non-medical: Not on file  Tobacco Use  . Smoking status: Former Smoker    Years: 15.00    Types: Cigarettes    Last attempt to quit: 10/06/1979    Years since quitting: 38.0  . Smokeless tobacco: Never Used  Substance and Sexual Activity  . Alcohol use: No  . Drug use: No  . Sexual activity: Not on file  Lifestyle  . Physical activity:    Days per week: Not on file    Minutes per session: Not on file  . Stress: Not on file  Relationships  . Social connections:    Talks on phone: Not on file    Gets together: Not on file  Attends religious service: Not on file    Active member of club or organization: Not on file    Attends meetings of clubs or organizations: Not on file    Relationship status: Not on file  . Intimate partner violence:    Fear of current or ex partner: Not on file    Emotionally abused: Not on file    Physically abused: Not on file    Forced sexual activity: Not on file  Other Topics Concern  . 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  She was married for 22 years, divorced, and has remained single. She has 1 grown son, 1 grown daughter.  She has 3 grandchildren, 2 of her children   live in [place].  She is retired.  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   Current Outpatient Medications on File Prior to Visit  Medication Sig Dispense Refill  . acetaminophen (TYLENOL) 500 MG tablet Take 1,000 mg by mouth daily as needed for moderate pain.    [name] amLODipine (NORVASC) 5 MG tablet TAKE 1 TABLET (5 MG TOTAL) BY MOUTH DAILY. 90 tablet 2  . apixaban (ELIQUIS) 5 MG TABS tablet Take 1 tablet (5 mg total) by mouth 2 (two)  times daily. 180 tablet 3  . denosumab (PROLIA) 60 MG/ML SOLN Inject 60 mg into the skin every 6 (six) months. Reported on 09/07/2015    . diclofenac sodium (VOLTAREN) 1 % GEL Apply 2 g topically 4 (four) times daily. 100 g 1  . diphenhydramine-acetaminophen (TYLENOL PM) 25-500 MG TABS tablet Take 2 tablets by mouth at bedtime as needed (sleep/pain).    . fluticasone (FLONASE) 50 MCG/ACT nasal spray Place 2 sprays into both nostrils daily as needed for allergies or rhinitis. 16 g 6  . levocetirizine (XYZAL) 5 MG tablet Take 1 tablet (5 mg total) by mouth every evening. 30 tablet 3  . levothyroxine (SYNTHROID, LEVOTHROID) 88 MCG tablet TAKE ONE TABLET BY MOUTH ONCE DAILY BEFORE  BREAKFAST 90 tablet 3  . lisinopril-hydrochlorothiazide (PRINZIDE,ZESTORETIC) 20-12.5 MG tablet TAKE 2 TABLETS BY MOUTH DAILY. 180 tablet 1  . loratadine (CLARITIN) 10 MG tablet Take 1 tablet (10 mg total) by mouth daily as needed for allergies. 90 tablet 2  . omeprazole (PRILOSEC) 20 MG capsule Take 1 capsule (20 mg total) by mouth daily as needed (for heartburn and indigestion). 30 capsule 11  . traMADol (ULTRAM) 50 MG tablet Take 1 tablet (50 mg total) by mouth every 8 (eight) hours as needed. 30 tablet 0   Current Facility-Administered Medications on File Prior to Visit  Medication Dose Route Frequency Provider Last Rate Last Dose  . aminophylline injection 150 mg  150 mg Intravenous BID PRN Dorothy Spark, MD   150 mg at 04/20/15 1200   Allergies  Allergen Reactions  . Tape Rash  . Codeine Nausea And Vomiting   Family History  Problem Relation Age of Onset  . Heart disease Mother   . Stroke Mother   . Hypertension Mother   . Heart failure Father   . Hypertension Brother   . Heart disease Brother   . Diverticulosis Sister   . Hypertension Brother   . Cancer Son        breast  . Heart attack  Neg Hx     PE: BP 116/74   Pulse 83   Ht 5' 3.25" (1.607 m)   Wt 138 lb 9.6 oz (62.9 kg)   SpO2 98%   BMI  24.36 kg/m  Wt Readings from Last 3 Encounters:  10/06/17 138 lb 9.6 oz (62.9 kg)  09/30/17 141 lb (64 kg)  09/10/17 139 lb 4 oz (63.2 kg)   Constitutional: normal weight, in NAD. No kyphosis. Eyes: PERRLA, EOMI, no exophthalmos ENT: moist mucous membranes, no thyromegaly, no cervical lymphadenopathy Cardiovascular: RRR, No MRG Respiratory: CTA B Gastrointestinal: abdomen soft, NT, ND, BS+ Musculoskeletal: no deformities, strength intact in all 4 Skin: moist, warm, no rashes Neurological: no tremor with outstretched hands, DTR normal in all 4  Assessment: 1. Osteoporosis  Plan: 1. Osteoporosis - likely postmenopausal (early menopause, no HRT) + age related, she has FH of OP - Discussed about increased risk of fracture, depending on the T score, greatly increased when the T score is lower than -2.5, but it is actually a continuum and -2.5 should not be regarded as an absolute threshold. We reviewed her last 3 DXA scan reports and images together, and I explained that based on the T scores, she has an increased risk for fractures. She already had several vertebral compression fractures >> her risk is quite high. We discussed about possible reasons for her fractures. Definitely early menopause is an issue. She does not have a h/o excessive steroid use, no h/o HPTH (will recheck today), no h/o severe vit D def (will recheck this today), no RA, no signs of Cushing's ds, no systemic mastocytosis, no h/o cancer, no renal failure to indicate multiple myeloma, no h/o low Aphos, no previous fractures (before her vertebral ones) or blue sclerae, to indicate osteogenesis imperfecta. I suspect that her missed Prolia doses may have increased her spine fracture risk >> we discussed that the BMD decreases precipitously after 6 months from last injection and the risk of fracture in this interval is actually higher than before starting Prolia. The last 2 BMD evaluations show stable T-scores after she started to  take this consistently.  - We discussed about the different medication classes, benefits and side effects (including atypical fractures and ONJ - no dental workup in progress or planned).  - She is on sq denosumab (Prolia) for 7 years, as mentioned above. Bisphosphonates were ineffective in the past and she already has 2 years of Forteo.  We do not have many options now, but Romosozumab is in final studies and I am hoping will be out on the market soon. For now, I suggested to continue Prolia for a total of 10 years, then get zoledronic acid (iv Reclast) for 1-2 years.  - we reviewed her dietary and supplemental calcium and vitamin D intake. I recommended to make sure she gets 1000-1200 mg of calcium daily preferentially from diet (will reduce her calcium supplement - now 1500 mg a day split in 3 doses) and I will check vit D today to see if she needs further supplementation. - discussed fall precautions   - given handout from [place] Re: weight bearing exercises - advised to do this at least 5/7 days - we discussed about maintaining a good amount of protein in her diet. The recommended daily protein intake is ~0.8 g per kilogram per day (~50 g daily for her weight). I advised her to try to aim for this amount, since a diet low in proteins can exacerbate osteoporosis. Also,  avoid smoking or >2 drinks of alcohol a day. - I recommended the following book for the concept of low-acid eating:   - Will check the following labs today: Orders Placed This Encounter  Procedures  . VITAMIN D 25 Hydroxy (Vit-D Deficiency, Fractures)  . PTH, intact and calcium  - if labs normal, will arrange for a Prolia inj ASAP - as she is now 7 mo after her last inj - will check a new DXA scan in 2 years after the last -  I explained that the first indication that the treatment is working is her not having anymore fractures. DXA scan changes are secondary: unchanged or slightly higher T-scores are  desirable - will see pt back in a year  Component     Latest Ref Rng & Units 10/06/2017  Calcium     8.7 - 10.3 mg/dL 10.0  PTH, Intact     15 - 65 pg/mL 25  VITD     30.00 - 100.00 ng/mL 29.82 (L)   PTH and calcium normal.  Vitamin D slightly low, I will suggest to add 1000 units of vitamin D daily.  Philemon Kingdom, MD PhD [facility]

## 2017-10-07 ENCOUNTER — Other Ambulatory Visit: Payer: Self-pay

## 2017-10-07 ENCOUNTER — Encounter: Payer: Self-pay | Admitting: Internal Medicine

## 2017-10-07 ENCOUNTER — Encounter: Payer: Medicare Other | Admitting: Physician Assistant

## 2017-10-07 ENCOUNTER — Telehealth: Payer: Self-pay | Admitting: Family Medicine

## 2017-10-07 LAB — PTH, INTACT AND CALCIUM
Calcium: 10 mg/dL (ref 8.7–10.3)
PTH: 25 pg/mL (ref 15–65)

## 2017-10-07 MED ORDER — VITAMIN D (ERGOCALCIFEROL) 1.25 MG (50000 UNIT) PO CAPS: 50000.0000 [IU] | ORAL_CAPSULE | ORAL | 2 refills | Status: DC

## 2017-10-07 MED FILL — VIT D2 1.25 MG (50,000 UNIT: 1.25 MG | 28 days supply | Qty: 4 | Fill #0

## 2017-10-07 NOTE — Telephone Encounter (Signed)
Prolia benefits received PA not required $300 secondary deductible 20% co-insurance   Patient may owe approximately $250 OOP  Patient due after 09/14/17  Letter mailed to inform patient of benefits and to schedule

## 2017-10-07 NOTE — Addendum Note (Signed)
Addended by: Bartholome Bill on: 10/07/2017 11:53 AM   Modules accepted: Orders

## 2017-10-09 ENCOUNTER — Ambulatory Visit (INDEPENDENT_AMBULATORY_CARE_PROVIDER_SITE_OTHER): Payer: Medicare Other

## 2017-10-09 DIAGNOSIS — M4850XA Collapsed vertebra, not elsewhere classified, site unspecified, initial encounter for fracture: Secondary | ICD-10-CM

## 2017-10-09 DIAGNOSIS — M810 Age-related osteoporosis without current pathological fracture: Secondary | ICD-10-CM

## 2017-10-09 MED ORDER — DENOSUMAB 60 MG/ML ~~LOC~~ SOLN
60.0000 mg | Freq: Once | SUBCUTANEOUS | Status: AC
  Administered 2017-10-09: 60 mg via SUBCUTANEOUS

## 2017-11-05 MED FILL — AMLODIPINE BESYLATE 5 MG TA: 5 | 90 days supply | Qty: 90 | Fill #1

## 2017-11-11 ENCOUNTER — Encounter: Payer: Medicare Other | Admitting: Internal Medicine

## 2017-11-11 ENCOUNTER — Ambulatory Visit (INDEPENDENT_AMBULATORY_CARE_PROVIDER_SITE_OTHER): Payer: Medicare Other | Admitting: *Deleted

## 2017-11-11 DIAGNOSIS — I442 Atrioventricular block, complete: Secondary | ICD-10-CM | POA: Diagnosis not present

## 2017-11-12 NOTE — Progress Notes (Signed)
Remote pacemaker transmission.   

## 2017-11-13 ENCOUNTER — Encounter: Payer: Self-pay | Admitting: Cardiology

## 2017-11-13 LAB — CUP PACEART REMOTE DEVICE CHECK
Battery Impedance: 770 Ohm
Battery Voltage: 2.77 V
Brady Statistic AP VP Percent: 1 %
Brady Statistic AP VS Percent: 0 %
Brady Statistic AS VP Percent: 99 %
Brady Statistic AS VS Percent: 0 %
Implantable Lead Implant Date: 20041110
Implantable Lead Implant Date: 20041110
Lead Channel Impedance Value: 394 Ohm
Lead Channel Impedance Value: 526 Ohm
Lead Channel Pacing Threshold Amplitude: 0.5 V
Lead Channel Pacing Threshold Pulse Width: 0.4 ms
Lead Channel Pacing Threshold Pulse Width: 0.4 ms
Lead Channel Setting Pacing Amplitude: 2 V
Lead Channel Setting Pacing Amplitude: 2.5 V
Lead Channel Setting Sensing Sensitivity: 4 mV
MDC IDC LEAD LOCATION: 753859
MDC IDC LEAD LOCATION: 753860
MDC IDC MSMT BATTERY REMAINING LONGEVITY: 63 mo
MDC IDC MSMT LEADCHNL RA SENSING INTR AMPL: 2.8 mV
MDC IDC MSMT LEADCHNL RV PACING THRESHOLD AMPLITUDE: 0.875 V
MDC IDC PG IMPLANT DT: 20130318
MDC IDC SESS DTM: 20190514164907
MDC IDC SET LEADCHNL RV PACING PULSEWIDTH: 0.4 ms

## 2017-11-17 MED FILL — LISINOPRIL-HCTZ 20-12.5 TAB: 20-12.5 | 90 days supply | Qty: 180 | Fill #1

## 2017-11-17 MED FILL — LEVOCETIRIZINE 5 MG TABLET: 5 | 30 days supply | Qty: 30 | Fill #2

## 2017-12-12 MED FILL — LORATADINE 10 MG TABLET: 10 | 100 days supply | Qty: 100 | Fill #0

## 2017-12-12 MED FILL — SM ALLERGY RELIEF 50 MCG SP: 50 MCG | 30 days supply | Qty: 16 | Fill #2

## 2018-01-14 MED FILL — LEVOCETIRIZINE 5 MG TABLET: 5 | 30 days supply | Qty: 30 | Fill #3

## 2018-01-28 MED FILL — SM ALLERGY RELIEF 50 MCG SP: 50 MCG | 30 days supply | Qty: 16 | Fill #3

## 2018-02-10 ENCOUNTER — Ambulatory Visit (INDEPENDENT_AMBULATORY_CARE_PROVIDER_SITE_OTHER): Payer: Medicare Other | Admitting: *Deleted

## 2018-02-10 ENCOUNTER — Telehealth: Payer: Self-pay | Admitting: Cardiology

## 2018-02-10 DIAGNOSIS — I442 Atrioventricular block, complete: Secondary | ICD-10-CM | POA: Diagnosis not present

## 2018-02-10 DIAGNOSIS — I10 Essential (primary) hypertension: Secondary | ICD-10-CM

## 2018-02-10 NOTE — Progress Notes (Signed)
Remote pacemaker transmission.   

## 2018-02-10 NOTE — Telephone Encounter (Signed)
LMOVM reminding pt to send remote transmission.   

## 2018-02-13 ENCOUNTER — Other Ambulatory Visit: Payer: Self-pay | Admitting: Family Medicine

## 2018-02-13 DIAGNOSIS — I10 Essential (primary) hypertension: Secondary | ICD-10-CM

## 2018-02-13 MED FILL — LISINOPRIL-HCTZ 20-12.5 TAB: 20-12.5 | 90 days supply | Qty: 180 | Fill #0

## 2018-03-05 ENCOUNTER — Encounter: Payer: Self-pay | Admitting: Family Medicine

## 2018-03-05 ENCOUNTER — Ambulatory Visit (INDEPENDENT_AMBULATORY_CARE_PROVIDER_SITE_OTHER): Payer: Medicare Other | Admitting: Family Medicine

## 2018-03-05 VITALS — BP 141/66 | HR 68 | Temp 97.6°F | Resp 16 | Ht 63.0 in | Wt 144.2 lb

## 2018-03-05 DIAGNOSIS — M62838 Other muscle spasm: Secondary | ICD-10-CM

## 2018-03-05 DIAGNOSIS — R531 Weakness: Secondary | ICD-10-CM | POA: Diagnosis not present

## 2018-03-05 DIAGNOSIS — R202 Paresthesia of skin: Secondary | ICD-10-CM

## 2018-03-05 DIAGNOSIS — R5383 Other fatigue: Secondary | ICD-10-CM

## 2018-03-05 DIAGNOSIS — G8929 Other chronic pain: Secondary | ICD-10-CM

## 2018-03-05 DIAGNOSIS — R200 Anesthesia of skin: Secondary | ICD-10-CM | POA: Diagnosis not present

## 2018-03-05 DIAGNOSIS — M791 Myalgia, unspecified site: Secondary | ICD-10-CM | POA: Diagnosis not present

## 2018-03-05 DIAGNOSIS — M545 Low back pain: Secondary | ICD-10-CM

## 2018-03-05 DIAGNOSIS — R0602 Shortness of breath: Secondary | ICD-10-CM

## 2018-03-05 LAB — COMPREHENSIVE METABOLIC PANEL
ALT: 11 U/L (ref 0–35)
AST: 22 U/L (ref 0–37)
Albumin: 4.5 g/dL (ref 3.5–5.2)
Alkaline Phosphatase: 43 U/L (ref 39–117)
BILIRUBIN TOTAL: 0.7 mg/dL (ref 0.2–1.2)
BUN: 28 mg/dL — ABNORMAL HIGH (ref 6–23)
CALCIUM: 9.9 mg/dL (ref 8.4–10.5)
CO2: 30 mEq/L (ref 19–32)
Chloride: 96 mEq/L (ref 96–112)
Creatinine, Ser: 1.05 mg/dL (ref 0.40–1.20)
GFR: 64.92 mL/min (ref >60.00)
GLUCOSE: 97 mg/dL (ref 70–99)
POTASSIUM: 4.6 meq/L (ref 3.5–5.1)
Sodium: 130 mEq/L — ABNORMAL LOW (ref 135–145)
Total Protein: 6.9 g/dL (ref 6.0–8.3)

## 2018-03-05 LAB — CBC WITH DIFFERENTIAL/PLATELET
Basophils Absolute: 0 10*3/uL (ref 0.0–0.1)
Basophils Relative: 0.6 % (ref 0.0–3.0)
Eosinophils Absolute: 0.1 10*3/uL (ref 0.0–0.7)
Eosinophils Relative: 1.4 % (ref 0.0–5.0)
HCT: 38.6 % (ref 36.0–46.0)
Hemoglobin: 13 g/dL (ref 12.0–15.0)
Lymphocytes Relative: 21.7 % (ref 12.0–46.0)
Lymphs Abs: 1.2 10*3/uL (ref 0.7–4.0)
MCHC: 33.7 g/dL (ref 30.0–36.0)
MCV: 82.2 fl (ref 78.0–100.0)
Monocytes Absolute: 0.5 10*3/uL (ref 0.1–1.0)
Monocytes Relative: 9.7 % (ref 3.0–12.0)
NEUTROS PCT: 66.6 % (ref 43.0–77.0)
Neutro Abs: 3.6 10*3/uL (ref 1.4–7.7)
PLATELETS: 166 10*3/uL (ref 150.0–400.0)
RBC: 4.69 Mil/uL (ref 3.87–5.11)
RDW: 13.1 % (ref 11.5–15.5)
WBC: 5.4 10*3/uL (ref 4.0–10.5)

## 2018-03-05 LAB — VITAMIN B12: Vitamin B-12: 334 pg/mL (ref 211–911)

## 2018-03-05 LAB — TSH: TSH: 5.4 u[IU]/mL — AB (ref 0.35–4.50)

## 2018-03-05 MED ORDER — TIZANIDINE HCL 4 MG PO TABS: 4.0000 mg | ORAL_TABLET | Freq: Four times a day (QID) | ORAL | 1 refills | Status: DC | PRN

## 2018-03-05 NOTE — Assessment & Plan Note (Signed)
Pt had kyphoplasty in the past Numbness / tingling may be from that  Check labs  Pain not bothering her today

## 2018-03-05 NOTE — Assessment & Plan Note (Signed)
Check ct chest

## 2018-03-05 NOTE — Progress Notes (Signed)
Patient ID: [name], female    DOB: 1941/05/23  Age: 77 y.o. MRN: [id number]    Subjective:  Subjective  HPI [name] presents for for headaches that started this year with change of season.  Headache is in back or head base of scalp.    Review of Systems  Constitutional: Negative for chills and fever.  HENT: Positive for congestion, ear pain and rhinorrhea. Negative for hearing loss.   Eyes: Negative for discharge.  Respiratory: Negative for cough and shortness of breath.   Cardiovascular: Negative for chest pain, palpitations and leg swelling.  Gastrointestinal: Negative for abdominal pain, blood in stool, constipation, diarrhea, nausea and vomiting.  Genitourinary: Negative for dysuria, frequency, hematuria and urgency.  Musculoskeletal: Positive for myalgias, neck pain and neck stiffness. Negative for back pain.  Skin: Negative for rash.  Allergic/Immunologic: Negative for environmental allergies.  Neurological: Positive for light-headedness and headaches. Negative for dizziness and weakness.  Hematological: Does not bruise/bleed easily.  Psychiatric/Behavioral: Negative for suicidal ideas. The patient is not nervous/anxious.     History Past Medical History:  Diagnosis Date  . Atrioventricular block, complete ([place])    a. 08/2011 Upgrage of PPM to MDT Adapta L Dual Chamber PPM ser # [id number].  . Cardiac pacemaker in situ   . Contact dermatitis and eczema    unspec cause  . Dairy product intolerance   . Depression   . Diastolic dysfunction   . GERD (gastroesophageal reflux disease)   . Hiatal hernia   . History of CHF (congestive heart failure)   . History of diverticulitis of colon    2003  perforated diverticulitis w/ surgical intervention  . History of kidney stones    age 49  . History of peripheral edema    lower extremities  . History of thyroid nodule    multinodular goiter  s/p  total thyroidectomy 2014  . HTN (hypertension)   . Hydronephrosis,  left   . Hypothyroidism   . Mild sleep apnea    per study 04-14-2014  . Nocturia   . Osteoporosis   . PAF (paroxysmal atrial fibrillation) ([place])   . PONV (postoperative nausea and vomiting)   . Pulmonary nodules    stable per last ct  . Renal cell carcinoma of left kidney ([facility])   . Shortness of breath    intermittant  . Superficial thrombophlebitis     She has a past surgical history that includes Thyroidectomy (N/A, 08/07/2012); Colonoscopy (01/19/2013); pacemaker generator change (Left, 09/16/2011); pacemaker placement (05-11-2003  ); EXPLORATORY LAPARTOMY SIGMOID COLECTOMY/ COLOSTOMY (04-28-2002); Colostomy takedown (07-22-2002); LAPAROSCOPY VENTRAL HERNIA REPAIR/  EXTENSIVE LYSIS ADHESIONS (05-04-2004); Cardiovascular stress test (04-20-2015  dr Caryl Comes); transthoracic echocardiogram (10-12-2014); Cystoscopy with retrograde pyelogram, ureteroscopy and stent placement (Left, 07/28/2015); Robot assisted laparoscopic nephrectomy (Left, 03/20/2016); and Kyphoplasty.   Her family history includes Cancer in her son; Diverticulosis in her sister; Heart disease in her brother and mother; Heart failure in her father; Hypertension in her brother, brother, and mother; Stroke in her mother.She reports that she quit smoking about 38 years ago. Her smoking use included cigarettes. She quit after 15.00 years of use. She has never used smokeless tobacco. She reports that she does not drink alcohol or use drugs.  Current Outpatient Medications on File Prior to Visit  Medication Sig Dispense Refill  . acetaminophen (TYLENOL) 500 MG tablet Take 1,000 mg by mouth daily as needed for moderate pain.    [name] amLODipine (NORVASC) 5 MG tablet TAKE 1 TABLET (  5 MG TOTAL) BY MOUTH DAILY. 90 tablet 2  . apixaban (ELIQUIS) 5 MG TABS tablet Take 1 tablet (5 mg total) by mouth 2 (two) times daily. 180 tablet 3  . denosumab (PROLIA) 60 MG/ML SOLN Inject 60 mg into the skin every 6 (six) months. Reported on 09/07/2015    .  diclofenac sodium (VOLTAREN) 1 % GEL Apply 2 g topically 4 (four) times daily. 100 g 1  . diphenhydramine-acetaminophen (TYLENOL PM) 25-500 MG TABS tablet Take 2 tablets by mouth at bedtime as needed (sleep/pain).    . fluticasone (FLONASE) 50 MCG/ACT nasal spray Place 2 sprays into both nostrils daily as needed for allergies or rhinitis. 16 g 6  . levocetirizine (XYZAL) 5 MG tablet Take 1 tablet (5 mg total) by mouth every evening. 30 tablet 3  . levothyroxine (SYNTHROID, LEVOTHROID) 88 MCG tablet TAKE ONE TABLET BY MOUTH ONCE DAILY BEFORE  BREAKFAST 90 tablet 3  . lisinopril-hydrochlorothiazide (PRINZIDE,ZESTORETIC) 20-12.5 MG tablet TAKE 2 TABLETS BY MOUTH DAILY. 180 tablet 0  . loratadine (CLARITIN) 10 MG tablet Take 1 tablet (10 mg total) by mouth daily as needed for allergies. 90 tablet 2   Current Facility-Administered Medications on File Prior to Visit  Medication Dose Route Frequency Provider Last Rate Last Dose  . aminophylline injection 150 mg  150 mg Intravenous BID PRN Dorothy Spark, MD   150 mg at 04/20/15 1200     Objective:  Objective  Physical Exam  Constitutional: She is oriented to person, place, and time. She appears well-developed and well-nourished.  HENT:  Head: Normocephalic and atraumatic.  Eyes: Conjunctivae and EOM are normal.  Neck: Normal range of motion. Neck supple. No JVD present. Carotid bruit is not present. No thyromegaly present.  Cardiovascular: Normal rate, regular rhythm and normal heart sounds.  No murmur heard. Pulmonary/Chest: Effort normal and breath sounds normal. No respiratory distress. She has no wheezes. She has no rales. She exhibits no tenderness.  Musculoskeletal: She exhibits no edema.  Neurological: She is alert and oriented to person, place, and time.  Psychiatric: She has a normal mood and affect.  Nursing note and vitals reviewed.  BP (!) 141/66   Pulse 68   Temp 97.6 F (36.4 C) (Oral)   Resp 16   Ht 5\' 3"  (1.6 m)   Wt  144 lb 3.2 oz (65.4 kg)   SpO2 100%   BMI 25.54 kg/m  Wt Readings from Last 3 Encounters:  03/05/18 144 lb 3.2 oz (65.4 kg)  10/06/17 138 lb 9.6 oz (62.9 kg)  09/30/17 141 lb (64 kg)     Lab Results  Component Value Date   WBC 5.4 03/05/2018   HGB 13.0 03/05/2018   HCT 38.6 03/05/2018   PLT 166.0 03/05/2018   GLUCOSE 97 03/05/2018   CHOL 147 09/03/2017   TRIG 56.0 09/03/2017   HDL 50.80 09/03/2017   LDLCALC 85 09/03/2017   ALT 11 03/05/2018   AST 22 03/05/2018   NA 130 (L) 03/05/2018   K 4.6 03/05/2018   CL 96 03/05/2018   CREATININE 1.05 03/05/2018   BUN 28 (H) 03/05/2018   CO2 30 03/05/2018   TSH 5.40 (H) 03/05/2018   INR 1.01 11/04/2013   HGBA1C 5.5 06/07/2016    Dg Bone Density  Result Date: 05/29/2017 EXAM: DUAL X-RAY ABSORPTIOMETRY (DXA) FOR BONE MINERAL DENSITY IMPRESSION: Referring Physician:  Rosalita Chessman CHASE PATIENT: Name: Shannah, Conteh Patient ID: [id number] Birth Date: 10-Jan-1941 Height: 63.0 in. Sex: Female  Measured: 05/29/2017 Weight: 135.6 lbs. Indications: Advanced Age, Caucasian, Estrogen Deficiency, Family Hx of Osteoporosis, Height Loss, History of Fracture (Adult), History of Osteopenia, Hypothyroidism, Post Menopausal, Previous Tobacco User, Secondary Osteoporosis Fractures: Lumbar Spine, Thoracic Spine Treatments: Calcium, Multivitamin, Prolia, Synthroid, Vitamin D ASSESSMENT: The BMD measured at Forearm Radius 33% is 0.600 g/cm2 with a T-score of -3.2. This patient is considered OSTEOPOROTIC according to [facility]) criteria. Lumbar spine was not utilized due to compression fractures. Site Region Measured Date Measured Age WHO YA BMD Classification T-score DualFemur Neck Left 05/29/2017 78.8 [age] Osteoporosis -3.1 0.612 g/cm2 Left Forearm Radius 33% 05/29/2017 78.8 Osteoporosis -3.2 0.600 g/cm2 World Health Organization [facility]) criteria for post-menopausal, Caucasian Women: Normal       T-score at or above -1 SD Osteopenia   T-score  between -1 and -2.5 SD Osteoporosis T-score at or below -2.5 SD RECOMMENDATION: [place] recommends that FDA-approved medical therapies be considered in postmenopausal women and men age 40 or older with a: 1. Hip or vertebral (clinical or morphometric) fracture. 2. T-score of < -2.5 at the spine or hip. 3. Ten-year fracture probability by FRAX of 3% or greater for hip fracture or 20% or greater for major osteoporotic fracture. All treatment decisions require clinical judgment and consideration of individual patient factors, including patient preferences, co-morbidities, previous drug use, risk factors not captured in the FRAX model (e.g. falls, vitamin D deficiency, increased bone turnover, interval significant decline in bone density) and possible under - or over-estimation of fracture risk by FRAX. All patients should ensure an adequate intake of dietary calcium (1200 mg/d) and vitamin D (800 IU daily) unless contraindicated. FOLLOW-UP: People with diagnosed cases of osteoporosis or at high risk for fracture should have regular bone mineral density tests. For patients eligible for Medicare, routine testing is allowed once every 2 years. The testing frequency can be increased to one year for patients who have rapidly progressing disease, those who are receiving or discontinuing medical therapy to restore bone mass, or have additional risk factors. I have reviewed this report and agree with the above findings. Mark A. Thornton Papas, M.D. [facility] Radiology Electronically Signed   By: Lavonia Dana M.D.   On: 05/29/2017 12:18     Assessment & Plan:  Plan  I have discontinued Kris Burd. Prine's omeprazole, traMADol, and Vitamin D (Ergocalciferol). I am also having her start on tiZANidine. Additionally, I am having her maintain her denosumab, diphenhydramine-acetaminophen, acetaminophen, apixaban, levocetirizine, loratadine, fluticasone, levothyroxine, amLODipine, diclofenac sodium, and  lisinopril-hydrochlorothiazide.  Meds ordered this encounter  Medications  . tiZANidine (ZANAFLEX) 4 MG tablet    Sig: Take 1 tablet (4 mg total) by mouth every 6 (six) hours as needed for muscle spasms.    Dispense:  30 tablet    Refill:  1    Problem List Items Addressed This Visit      Unprioritized   Low back pain    Pt had kyphoplasty in the past Numbness / tingling may be from that  Check labs  Pain not bothering her today      Relevant Medications   tiZANidine (ZANAFLEX) 4 MG tablet    Other Visit Diagnoses    Fatigue, unspecified type    -  Primary   Relevant Orders   CBC with Differential/Platelet (Completed)   TSH (Completed)   Vitamin B12 (Completed)   Comprehensive metabolic panel (Completed)   Weakness       Relevant Orders   CBC with Differential/Platelet (Completed)  TSH (Completed)   Vitamin B12 (Completed)   Comprehensive metabolic panel (Completed)   Numbness and tingling of left lower extremity       Relevant Orders   CBC with Differential/Platelet (Completed)   TSH (Completed)   Vitamin B12 (Completed)   Comprehensive metabolic panel (Completed)   Myalgia       Relevant Orders   CBC with Differential/Platelet (Completed)   TSH (Completed)   Vitamin B12 (Completed)   Comprehensive metabolic panel (Completed)   Muscle spasm       Relevant Medications   tiZANidine (ZANAFLEX) 4 MG tablet   Shortness of breath       Relevant Orders   CT Chest Wo Contrast      Follow-up: Return if symptoms worsen or fail to improve.  [name], DO

## 2018-03-05 NOTE — Patient Instructions (Signed)
Shortness of Breath, Adult Shortness of breath is when a person has trouble breathing enough air, or when a person feels like she or he is having trouble breathing in enough air. Shortness of breath could be a sign of medical problem. Follow these instructions at home: Pay attention to any changes in your symptoms. Take these actions to help with your condition:  Do not smoke. Smoking is a common cause of shortness of breath. If you smoke and you need help quitting, ask your health care provider.  Avoid things that can irritate your airways, such as: ? Mold. ? Dust. ? Air pollution. ? Chemical fumes. ? Things that can cause allergy symptoms (allergens), if you have allergies.  Keep your living space clean and free of mold and dust.  Rest as needed. Slowly return to your usual activities.  Take over-the-counter and prescription medicines, including oxygen and inhaled medicines, only as told by your health care provider.  Keep all follow-up visits as told by your health care provider. This is important.  Contact a health care provider if:  Your condition does not improve as soon as expected.  You have a hard time doing your normal activities, even after you rest.  You have new symptoms. Get help right away if:  Your shortness of breath gets worse.  You have shortness of breath when you are resting.  You feel light-headed or you faint.  You have a cough that is not controlled with medicines.  You cough up blood.  You have pain with breathing.  You have pain in your chest, arms, shoulders, or abdomen.  You have a fever.  You cannot walk up stairs or exercise the way that you normally do. This information is not intended to replace advice given to you by your health care provider. Make sure you discuss any questions you have with your health care provider. Document Released: 03/12/2001 Document Revised: 01/06/2016 Document Reviewed: 11/23/2015 Elsevier Interactive Patient  Education  2018 Elsevier Inc.  

## 2018-03-06 ENCOUNTER — Encounter: Payer: Self-pay | Admitting: Family Medicine

## 2018-03-06 LAB — CUP PACEART REMOTE DEVICE CHECK
Battery Remaining Longevity: 62 mo
Battery Voltage: 2.77 V
Brady Statistic AP VS Percent: 0 %
Brady Statistic AS VS Percent: 0 %
Date Time Interrogation Session: 20190813160713
Implantable Lead Implant Date: 20041110
Implantable Lead Location: 753860
Lead Channel Impedance Value: 401 Ohm
Lead Channel Pacing Threshold Pulse Width: 0.4 ms
Lead Channel Pacing Threshold Pulse Width: 0.4 ms
Lead Channel Setting Pacing Amplitude: 2 V
Lead Channel Setting Pacing Pulse Width: 0.4 ms
Lead Channel Setting Sensing Sensitivity: 4 mV
MDC IDC LEAD IMPLANT DT: 20041110
MDC IDC LEAD LOCATION: 753859
MDC IDC MSMT BATTERY IMPEDANCE: 819 Ohm
MDC IDC MSMT LEADCHNL RA IMPEDANCE VALUE: 549 Ohm
MDC IDC MSMT LEADCHNL RA PACING THRESHOLD AMPLITUDE: 0.5 V
MDC IDC MSMT LEADCHNL RV PACING THRESHOLD AMPLITUDE: 1 V
MDC IDC PG IMPLANT DT: 20130318
MDC IDC SET LEADCHNL RV PACING AMPLITUDE: 2.5 V
MDC IDC STAT BRADY AP VP PERCENT: 1 %
MDC IDC STAT BRADY AS VP PERCENT: 99 %

## 2018-03-06 MED FILL — AMLODIPINE BESYLATE 5 MG TA: 5 | 90 days supply | Qty: 90 | Fill #2

## 2018-03-09 ENCOUNTER — Ambulatory Visit (HOSPITAL_BASED_OUTPATIENT_CLINIC_OR_DEPARTMENT_OTHER): Payer: Medicare Other

## 2018-03-10 MED FILL — SM ALLERGY RELIEF 50 MCG SP: 50 MCG | 30 days supply | Qty: 16 | Fill #4

## 2018-03-10 NOTE — Telephone Encounter (Signed)
I would have to see her  We need doc of symptoms

## 2018-03-10 NOTE — Telephone Encounter (Signed)
It looks like you may have talked about this with her at the last visit 03/05/18.

## 2018-03-11 ENCOUNTER — Encounter: Payer: Self-pay | Admitting: Family Medicine

## 2018-03-11 DIAGNOSIS — E871 Hypo-osmolality and hyponatremia: Secondary | ICD-10-CM

## 2018-03-11 DIAGNOSIS — E039 Hypothyroidism, unspecified: Secondary | ICD-10-CM

## 2018-03-13 MED ORDER — LEVOTHYROXINE SODIUM 100 MCG PO TABS: 100.0000 ug | ORAL_TABLET | Freq: Every day | ORAL | 2 refills | Status: DC

## 2018-03-16 ENCOUNTER — Ambulatory Visit (HOSPITAL_BASED_OUTPATIENT_CLINIC_OR_DEPARTMENT_OTHER)
Admission: RE | Admit: 2018-03-16 | Discharge: 2018-03-16 | Disposition: A | Discharge status: Home / Self Care | Payer: Medicare Other | Source: Ambulatory Visit | Attending: Family Medicine | Admitting: Family Medicine

## 2018-03-16 DIAGNOSIS — R918 Other nonspecific abnormal finding of lung field: Secondary | ICD-10-CM | POA: Insufficient documentation

## 2018-03-16 DIAGNOSIS — I700 Atherosclerosis of aorta: Secondary | ICD-10-CM | POA: Insufficient documentation

## 2018-03-16 DIAGNOSIS — R0602 Shortness of breath: Secondary | ICD-10-CM | POA: Diagnosis present

## 2018-03-19 MED FILL — LORATADINE 10 MG TABLET: 10 | 100 days supply | Qty: 100 | Fill #1

## 2018-03-31 ENCOUNTER — Other Ambulatory Visit: Payer: Self-pay | Admitting: Family Medicine

## 2018-03-31 MED FILL — LEVOCETIRIZINE 5 MG TABLET: 5 | 30 days supply | Qty: 30 | Fill #0

## 2018-04-09 ENCOUNTER — Encounter: Payer: Self-pay | Admitting: Medical

## 2018-04-09 ENCOUNTER — Ambulatory Visit (HOSPITAL_BASED_OUTPATIENT_CLINIC_OR_DEPARTMENT_OTHER)
Admission: RE | Admit: 2018-04-09 | Discharge: 2018-04-09 | Disposition: A | Discharge status: Home / Self Care | Payer: Medicare Other | Source: Ambulatory Visit | Attending: Medical | Admitting: Medical

## 2018-04-09 ENCOUNTER — Ambulatory Visit: Payer: Medicare Other | Admitting: Family Medicine

## 2018-04-09 ENCOUNTER — Ambulatory Visit (INDEPENDENT_AMBULATORY_CARE_PROVIDER_SITE_OTHER): Payer: Medicare Other | Admitting: Medical

## 2018-04-09 VITALS — BP 132/68 | HR 74 | Temp 97.9°F | Resp 16 | Ht 63.0 in | Wt 147.4 lb

## 2018-04-09 DIAGNOSIS — M25552 Pain in left hip: Secondary | ICD-10-CM

## 2018-04-09 DIAGNOSIS — M5432 Sciatica, left side: Secondary | ICD-10-CM

## 2018-04-09 DIAGNOSIS — M5442 Lumbago with sciatica, left side: Secondary | ICD-10-CM

## 2018-04-09 DIAGNOSIS — M1612 Unilateral primary osteoarthritis, left hip: Secondary | ICD-10-CM | POA: Insufficient documentation

## 2018-04-09 MED ORDER — METHYLPREDNISOLONE ACETATE 40 MG/ML IJ SUSP
40.0000 mg | Freq: Once | INTRAMUSCULAR | Status: AC
  Administered 2018-04-09: 40 mg via INTRAMUSCULAR

## 2018-04-09 MED ORDER — PREDNISONE 10 MG PO TABS: 10.0000 mg | ORAL_TABLET | Freq: Every day | ORAL | 0 refills | Status: DC

## 2018-04-09 MED FILL — predniSONE 10 MG TABS: 10 | 5 days supply | Qty: 15 | Fill #0

## 2018-04-09 NOTE — Patient Instructions (Signed)
You do appear to have recent left sciatica as well as hip region pain.  We gave you Depo-Medrol 40 mg IM injection and you can start 5-day taper dose of prednisone tomorrow.  I do want to get lumbar spine x-ray and left hip x-ray.  I do think lumbar spine x-ray would be beneficial in light of the fact that you have chronic low back pain with history of several kyphoplasty's.  If pain persists despite the above treatment then might refer you to orthopedist or sports medicine.  Would recommend that you take it easy over the next week and not do your daily walks.  Follow-up in 7 to 10 days or as needed.

## 2018-04-09 NOTE — Progress Notes (Signed)
   Subjective:    Patient ID: [name], female    DOB: [birth year], 77 y.o.   MRN: [id number]  HPI  Pt in with some left hip region and leg pain. Pt states she has not been exercising much but then recently just started back.   She notes that pain got worse with her starting walking. But even before she was having some left hip pain when laying on left side for past 2 months.   Pt also reports hx of lumbar compression fractures in 2015 and 2016. She had kyphoplasty. Still has some residual pain but not all the sudden   Review of Systems  Constitutional: Negative for chills, fatigue and fever.  Respiratory: Negative for cough, chest tightness, shortness of breath and wheezing.   Cardiovascular: Negative for chest pain and palpitations.  Gastrointestinal: Negative for abdominal distention, abdominal pain, nausea and vomiting.  Musculoskeletal: Positive for back pain.       Hip pain  Skin: Negative for rash.  Neurological: Negative for dizziness, seizures, numbness and headaches.  Hematological: Negative for adenopathy. Does not bruise/bleed easily.  Psychiatric/Behavioral: Negative for behavioral problems and decreased concentration.       Objective:   Physical Exam   General Appearance- Not in acute distress.    Chest and Lung Exam Auscultation: Breath sounds:-Normal. Clear even and unlabored. Adventitious sounds:- No Adventitious sounds.  Cardiovascular Auscultation:Rythm - Regular, rate and rythm. Heart Sounds -Normal heart sounds.  Abdomen Inspection:-Inspection Normal.  Palpation/Perucssion: Palpation and Percussion of the abdomen reveal- Non Tender, No Rebound tenderness, No rigidity(Guarding) and No Palpable abdominal masses.  Liver:-Normal.  Spleen:- Normal.   Back No mid lumbar spine tenderness to palpation. Direct left si tenderness Pain on straight leg lift. Pain on lateral movements and flexion/extension of the spine.  Lower ext neurologic  L5-S1  sensation intact bilaterally. Normal patellar reflexes bilaterally. No foot drop bilaterally.  Left hip-  direct pain on palpation on palpation trochanter. No pain on range of motion.      Assessment & Plan:  You do appear to have recent left sciatica as well as hip region pain.  We gave you Depo-Medrol 40 mg IM injection and you can start 5-day taper dose of prednisone tomorrow.  I do want to get lumbar spine x-ray and left hip x-ray.  I do think lumbar spine x-ray would be beneficial in light of the fact that you have chronic low back pain with history of several kyphoplasty's.  If pain persists despite the above treatment then might refer you to orthopedist or sports medicine.  Would recommend that you take it easy over the next week and not do your daily walks.  Follow-up in 7 to 10 days or as needed.  Mackie Pai, PA-C

## 2018-04-09 NOTE — Addendum Note (Signed)
Addended by: Hinton Dyer on: 04/09/2018 02:58 PM   Modules accepted: Orders

## 2018-04-17 ENCOUNTER — Telehealth: Payer: Self-pay

## 2018-04-17 NOTE — Telephone Encounter (Signed)
LMTCB to schedule Prolia she owes $0 and is ready to be scheduled for nurse visit now

## 2018-05-06 ENCOUNTER — Encounter: Payer: Self-pay | Admitting: Internal Medicine

## 2018-05-12 ENCOUNTER — Ambulatory Visit: Payer: Medicare Other

## 2018-05-12 ENCOUNTER — Telehealth: Payer: Self-pay | Admitting: Cardiology

## 2018-05-12 ENCOUNTER — Ambulatory Visit (INDEPENDENT_AMBULATORY_CARE_PROVIDER_SITE_OTHER): Payer: Medicare Other | Admitting: *Deleted

## 2018-05-12 DIAGNOSIS — I442 Atrioventricular block, complete: Secondary | ICD-10-CM

## 2018-05-12 NOTE — Telephone Encounter (Signed)
LMOVM reminding pt to send remote transmission.   

## 2018-05-13 NOTE — Progress Notes (Signed)
Remote pacemaker transmission.   

## 2018-05-14 ENCOUNTER — Other Ambulatory Visit: Payer: Self-pay | Admitting: Family Medicine

## 2018-05-14 ENCOUNTER — Other Ambulatory Visit (INDEPENDENT_AMBULATORY_CARE_PROVIDER_SITE_OTHER): Payer: Medicare Other

## 2018-05-14 ENCOUNTER — Ambulatory Visit (INDEPENDENT_AMBULATORY_CARE_PROVIDER_SITE_OTHER): Payer: Medicare Other

## 2018-05-14 DIAGNOSIS — I10 Essential (primary) hypertension: Secondary | ICD-10-CM

## 2018-05-14 DIAGNOSIS — M810 Age-related osteoporosis without current pathological fracture: Secondary | ICD-10-CM | POA: Diagnosis not present

## 2018-05-14 DIAGNOSIS — E871 Hypo-osmolality and hyponatremia: Secondary | ICD-10-CM | POA: Diagnosis not present

## 2018-05-14 DIAGNOSIS — E039 Hypothyroidism, unspecified: Secondary | ICD-10-CM

## 2018-05-14 LAB — BASIC METABOLIC PANEL
BUN: 22 mg/dL (ref 6–23)
CO2: 29 mEq/L (ref 19–32)
Calcium: 9.2 mg/dL (ref 8.4–10.5)
Chloride: 96 mEq/L (ref 96–112)
Creatinine, Ser: 1.07 mg/dL (ref 0.40–1.20)
GFR: 63.49 mL/min (ref >60.00)
GLUCOSE: 89 mg/dL (ref 70–99)
POTASSIUM: 4 meq/L (ref 3.5–5.1)
SODIUM: 132 meq/L — AB (ref 135–145)

## 2018-05-14 LAB — TSH: TSH: 2.92 u[IU]/mL (ref 0.35–4.50)

## 2018-05-14 MED ORDER — DENOSUMAB 60 MG/ML ~~LOC~~ SOSY
60.0000 mg | PREFILLED_SYRINGE | Freq: Once | SUBCUTANEOUS | Status: AC
  Administered 2018-05-14: 60 mg via SUBCUTANEOUS

## 2018-05-14 NOTE — Progress Notes (Signed)
Per orders of Dr. Gherghe injection of Prolia given today by Scarlett Portlock, Certified Medical Assistant . Patient tolerated injection well.  

## 2018-05-15 MED FILL — LISINOPRIL-HCTZ 20-12.5 MG: 20-12.5 | 90 days supply | Qty: 180 | Fill #0

## 2018-05-17 ENCOUNTER — Encounter: Payer: Self-pay | Admitting: Family Medicine

## 2018-05-17 DIAGNOSIS — I10 Essential (primary) hypertension: Secondary | ICD-10-CM

## 2018-05-18 MED ORDER — LEVOTHYROXINE SODIUM 100 MCG PO TABS: 100.0000 ug | ORAL_TABLET | Freq: Every day | ORAL | 1 refills | Status: DC

## 2018-05-21 ENCOUNTER — Other Ambulatory Visit: Payer: Self-pay

## 2018-05-22 ENCOUNTER — Telehealth: Payer: Self-pay

## 2018-05-22 NOTE — Telephone Encounter (Signed)
**Note De-Identified [name] Obfuscation** The pt brought in her [place] pt asst application for Eliquis. I have completed the provider part of the application and placed it in Dr Aquilla Hacker mail bin awaiting his signature.

## 2018-05-25 MED FILL — LEVOCETIRIZINE 5 MG TABLET: 5 | 30 days supply | Qty: 30 | Fill #1

## 2018-05-25 NOTE — Telephone Encounter (Signed)
Dr Caryl Comes has signed the pts application.  I called the pt and asked her if she wants to include her 2019 proof of income or out of pocket expense report but she states that she read the application and it states that she may need to supply that info.  She is requesting that I fax her application as is and that she will respond if [employer] reaches out to her for more info.  I have faxed her pt asst application for Eliquis to [employer] as is.

## 2018-06-30 MED FILL — LEVOCETIRIZINE 5 MG TABLET: 5 | 30 days supply | Qty: 30 | Fill #2

## 2018-07-08 NOTE — Telephone Encounter (Signed)
**Note De-Identified [name] Obfuscation** The pt called asking if I have heard back from [employer] concerning her application that was sent to them in Nov. 2019. I advised her that I have not and that she needs to contact [employer] to check on her application and I gave her their phone number.  She states that she will contact them to see if she was approved or not.

## 2018-07-09 ENCOUNTER — Other Ambulatory Visit: Payer: Self-pay | Admitting: Family Medicine

## 2018-07-09 DIAGNOSIS — Z889 Allergy status to unspecified drugs, medicaments and biological substances status: Secondary | ICD-10-CM

## 2018-07-10 MED FILL — LORATADINE 10 MG TABLET: 10 | 100 days supply | Qty: 100 | Fill #0

## 2018-07-12 LAB — CUP PACEART REMOTE DEVICE CHECK
Battery Impedance: 922 Ohm
Battery Remaining Longevity: 57 mo
Battery Voltage: 2.76 V
Brady Statistic AP VP Percent: 1 %
Brady Statistic AP VS Percent: 0 %
Brady Statistic AS VP Percent: 99 %
Brady Statistic AS VS Percent: 0 %
Date Time Interrogation Session: 20191112182130
Implantable Lead Implant Date: 20041110
Implantable Lead Implant Date: 20041110
Implantable Lead Location: 753859
Implantable Lead Location: 753860
Implantable Pulse Generator Implant Date: 20130318
Lead Channel Impedance Value: 390 Ohm
Lead Channel Impedance Value: 503 Ohm
Lead Channel Pacing Threshold Amplitude: 0.5 V
Lead Channel Pacing Threshold Amplitude: 0.875 V
Lead Channel Pacing Threshold Pulse Width: 0.4 ms
Lead Channel Pacing Threshold Pulse Width: 0.4 ms
Lead Channel Setting Pacing Amplitude: 2 V
Lead Channel Setting Pacing Amplitude: 2.5 V
Lead Channel Setting Pacing Pulse Width: 0.4 ms
Lead Channel Setting Sensing Sensitivity: 4 mV

## 2018-07-20 ENCOUNTER — Other Ambulatory Visit: Payer: Self-pay | Admitting: Family Medicine

## 2018-07-20 DIAGNOSIS — I10 Essential (primary) hypertension: Secondary | ICD-10-CM

## 2018-07-20 MED FILL — AMLODIPINE BESYLATE 5 MG TA: 5 | 90 days supply | Qty: 90 | Fill #0

## 2018-07-27 NOTE — Telephone Encounter (Signed)
**Note De-Identified [name] Obfuscation** Letter received from [employer] stating that they have denied the pt assistance with her Eliquis. Reason: Patient income.  The letter states that they have notified the pt of this decision.

## 2018-07-31 NOTE — Telephone Encounter (Signed)
We received a letter via fax from [employer] stating that they have approved the pt for pt asst with Eliquis. Approval good from 07/30/2018 until 07/01/2019. Application Case#: [id number]

## 2018-08-05 MED FILL — LEVOCETIRIZINE 5 MG TABLET: 5 | 30 days supply | Qty: 30 | Fill #3

## 2018-08-11 ENCOUNTER — Ambulatory Visit (INDEPENDENT_AMBULATORY_CARE_PROVIDER_SITE_OTHER): Payer: Medicare Other

## 2018-08-11 DIAGNOSIS — I442 Atrioventricular block, complete: Secondary | ICD-10-CM

## 2018-08-11 LAB — CUP PACEART REMOTE DEVICE CHECK
Battery Impedance: 1025 Ohm
Battery Voltage: 2.77 V
Brady Statistic AP VP Percent: 1 %
Brady Statistic AP VS Percent: 0 %
Brady Statistic AS VS Percent: 0 %
Implantable Lead Implant Date: 20041110
Implantable Lead Location: 753859
Implantable Pulse Generator Implant Date: 20130318
Lead Channel Impedance Value: 382 Ohm
Lead Channel Impedance Value: 518 Ohm
Lead Channel Pacing Threshold Amplitude: 0.5 V
Lead Channel Pacing Threshold Pulse Width: 0.4 ms
Lead Channel Setting Pacing Amplitude: 2.5 V
Lead Channel Setting Pacing Pulse Width: 0.4 ms
MDC IDC LEAD IMPLANT DT: 20041110
MDC IDC LEAD LOCATION: 753860
MDC IDC MSMT BATTERY REMAINING LONGEVITY: 54 mo
MDC IDC MSMT LEADCHNL RV PACING THRESHOLD AMPLITUDE: 1 V
MDC IDC MSMT LEADCHNL RV PACING THRESHOLD PULSEWIDTH: 0.4 ms
MDC IDC SESS DTM: 20200210190956
MDC IDC SET LEADCHNL RA PACING AMPLITUDE: 2 V
MDC IDC SET LEADCHNL RV SENSING SENSITIVITY: 4 mV
MDC IDC STAT BRADY AS VP PERCENT: 99 %

## 2018-08-12 ENCOUNTER — Other Ambulatory Visit: Payer: Self-pay | Admitting: Family Medicine

## 2018-08-12 DIAGNOSIS — I10 Essential (primary) hypertension: Secondary | ICD-10-CM

## 2018-08-13 MED FILL — LISINOPRIL-HCTZ 20-12.5 MG: 20-12.5 | 90 days supply | Qty: 180 | Fill #0

## 2018-08-24 NOTE — Progress Notes (Signed)
Remote pacemaker transmission.   

## 2018-09-08 ENCOUNTER — Other Ambulatory Visit: Payer: Self-pay | Admitting: Family Medicine

## 2018-09-08 MED FILL — LEVOCETIRIZINE 5 MG TABLET: 5 | 30 days supply | Qty: 30 | Fill #0

## 2018-09-23 ENCOUNTER — Other Ambulatory Visit: Payer: Self-pay | Admitting: Family Medicine

## 2018-09-23 DIAGNOSIS — Z889 Allergy status to unspecified drugs, medicaments and biological substances status: Secondary | ICD-10-CM

## 2018-09-23 MED FILL — SM ALLERGY RELIEF 50 MCG SP: 50 MCG | 30 days supply | Qty: 16 | Fill #0

## 2018-09-23 MED FILL — LORATADINE 10 MG TABLET: 10 | 100 days supply | Qty: 100 | Fill #1

## 2018-10-07 ENCOUNTER — Ambulatory Visit: Payer: Medicare Other | Admitting: Internal Medicine

## 2018-10-27 MED FILL — LEVOCETIRIZINE 5 MG TABLET: 5 | 30 days supply | Qty: 30 | Fill #1

## 2018-10-27 MED FILL — AMLODIPINE BESYLATE 5 MG TA: 5 | 90 days supply | Qty: 90 | Fill #1

## 2018-10-28 ENCOUNTER — Encounter: Payer: Self-pay | Admitting: Family Medicine

## 2018-11-02 ENCOUNTER — Encounter: Payer: Self-pay | Admitting: Family Medicine

## 2018-11-03 ENCOUNTER — Other Ambulatory Visit: Payer: Self-pay

## 2018-11-03 ENCOUNTER — Encounter: Payer: Self-pay | Admitting: Family Medicine

## 2018-11-03 ENCOUNTER — Other Ambulatory Visit: Payer: Self-pay | Admitting: Family Medicine

## 2018-11-03 ENCOUNTER — Ambulatory Visit (INDEPENDENT_AMBULATORY_CARE_PROVIDER_SITE_OTHER): Payer: Medicare Other | Admitting: Family Medicine

## 2018-11-03 VITALS — Ht 63.0 in | Wt 148.4 lb

## 2018-11-03 DIAGNOSIS — M5432 Sciatica, left side: Secondary | ICD-10-CM | POA: Diagnosis not present

## 2018-11-03 DIAGNOSIS — I10 Essential (primary) hypertension: Secondary | ICD-10-CM

## 2018-11-03 MED ORDER — PREDNISONE 10 MG PO TABS: ORAL_TABLET | ORAL | 0 refills | Status: DC

## 2018-11-03 MED ORDER — TIZANIDINE HCL 4 MG PO TABS: 4.0000 mg | ORAL_TABLET | Freq: Four times a day (QID) | ORAL | 0 refills | Status: DC | PRN

## 2018-11-03 MED FILL — predniSONE 10 MG TABS: 10 | 12 days supply | Qty: 20 | Fill #0

## 2018-11-03 MED FILL — tiZANidine HCL 4 MG TABS: 4 | 7 days supply | Qty: 30 | Fill #0

## 2018-11-03 NOTE — Progress Notes (Signed)
Virtual Visit via Video Note  I connected with [name] on 11/03/18 at  2:15 PM EDT by a video enabled telemedicine application and verified that I am speaking with the correct person using two identifiers.  Location: Patient: home Provider: office   I discussed the limitations of evaluation and management by telemedicine and the availability of in person appointments. The patient expressed understanding and agreed to proceed.  History of Present Illness: Pt c/o L hip and back pain .  She has same thing last fall and was given depo medrol and pred taper.  It helped but never went away and now its flared again.   Pt is in a lot of pain.  She prefers not to have mri.  See xrays done in fall.   She would like to try pred again.     Observations/Objective: .unable to get vitals due to pt not having equipment  Pt is in NAD   Assessment and Plan: 1. Sciatica of left side pred taper and muscle relaxer Heat alt ice Pt will let us know if it is not helping and then we will schedule MRI - predniSONE (DELTASONE) 10 MG tablet; TAKE 3 TABLETS PO QD FOR 3 DAYS THEN TAKE 2 TABLETS PO QD FOR 3 DAYS THEN TAKE 1 TABLET PO QD FOR 3 DAYS THEN TAKE 1/2 TAB PO QD FOR 3 DAYS  Dispense: 20 tablet; Refill: 0 - tiZANidine (ZANAFLEX) 4 MG tablet; Take 1 tablet (4 mg total) by mouth every 6 (six) hours as needed for muscle spasms.  Dispense: 30 tablet; Refill: 0   Follow Up Instructions:    I discussed the assessment and treatment plan with the patient. The patient was provided an opportunity to ask questions and all were answered. The patient agreed with the plan and demonstrated an understanding of the instructions.   The patient was advised to call back or seek an in-person evaluation if the symptoms worsen or if the condition fails to improve as anticipated.  I provided 20 minutes of non-face-to-face time during this encounter.   [name], DO

## 2018-11-05 MED FILL — LISINOPRIL-HCTZ 20-12.5 MG: 20-12.5 | 90 days supply | Qty: 180 | Fill #0

## 2018-11-10 ENCOUNTER — Ambulatory Visit (INDEPENDENT_AMBULATORY_CARE_PROVIDER_SITE_OTHER): Payer: Medicare Other | Admitting: *Deleted

## 2018-11-10 ENCOUNTER — Other Ambulatory Visit: Payer: Self-pay

## 2018-11-10 DIAGNOSIS — I442 Atrioventricular block, complete: Secondary | ICD-10-CM

## 2018-11-10 DIAGNOSIS — I480 Paroxysmal atrial fibrillation: Secondary | ICD-10-CM

## 2018-11-11 ENCOUNTER — Telehealth: Payer: Self-pay

## 2018-11-11 LAB — CUP PACEART REMOTE DEVICE CHECK
Battery Impedance: 1078 Ohm
Battery Remaining Longevity: 52 mo
Battery Voltage: 2.76 V
Brady Statistic AP VP Percent: 1 %
Brady Statistic AP VS Percent: 0 %
Brady Statistic AS VP Percent: 99 %
Brady Statistic AS VS Percent: 0 %
Date Time Interrogation Session: 20200513154238
Implantable Lead Implant Date: 20041110
Implantable Lead Implant Date: 20041110
Implantable Lead Location: 753859
Implantable Lead Location: 753860
Implantable Pulse Generator Implant Date: 20130318
Lead Channel Impedance Value: 380 Ohm
Lead Channel Impedance Value: 526 Ohm
Lead Channel Pacing Threshold Amplitude: 0.5 V
Lead Channel Pacing Threshold Amplitude: 0.875 V
Lead Channel Pacing Threshold Pulse Width: 0.4 ms
Lead Channel Pacing Threshold Pulse Width: 0.4 ms
Lead Channel Setting Pacing Amplitude: 2 V
Lead Channel Setting Pacing Amplitude: 2.5 V
Lead Channel Setting Pacing Pulse Width: 0.4 ms
Lead Channel Setting Sensing Sensitivity: 4 mV

## 2018-11-11 NOTE — Telephone Encounter (Signed)
Left message for patient to remind of missed remote transmission.  

## 2018-11-13 ENCOUNTER — Telehealth: Payer: Self-pay | Admitting: Internal Medicine

## 2018-11-13 NOTE — Telephone Encounter (Signed)
Pt last Prolia injection was 05/14/2018 - pt is now due and is wanting to setup a curb side visit to have this done.  Please advise if Prolia is available so patient can be scheduled. Thanks.

## 2018-11-15 NOTE — Progress Notes (Addendum)
Patient ID: [name], female   DOB: [birth year], 78 y.o.   MRN: [id number]   Patient location: Home My location: Office  Referring Provider: [name], DO  I connected with the patient on 11/15/18 at  3:32 PM EDT by a video enabled telemedicine application and verified that I am speaking with the correct person.   I discussed the limitations of evaluation and management by telemedicine and the availability of in person appointments. The patient expressed understanding and agreed to proceed.   Details of the encounter are shown below.  HPI  RIELYN KRUPINSKI is a 78 y.o.-year-old female, referred by her PCP, Dr. Etter Sjogren, presenting for follow-up for osteoporosis.  Last visit a year ago.  Pt was dx with OP in in 1999-2000 in [place].  Reviewed patient's DXA scan reports: Date L1-L4 T score FN T score 33% distal Radius Ultra distal radius  05/29/2017 [facility]) N/a (multiple compression fractures) LFN: -3.1 (+0.5%) RFN: -2.9 -3.2 (-1.0%) -4.6  05/29/2015 ([facility]) L1, L3: -2.7 LFN: -3.1 -3.1 n/a  06/04/2012 L1-L4: -3.4 LFN: -2.2 RFN: -2.2 n/a n/a   Fractures: 7 vertebral fxs -6 kyphoplasty's: Reviewed CT Lumbar spine report from 2016: Remote compression fractures with cement augmentation T11, T12, L2, L4 and L5. Tiny amount of cement seen adjacent to the right L4 and L5 nerve roots. Subacute superior endplate compression deformity L1 and L3.  Osteoporosis treatments: - Fosamax  - 2 years >> ineffective - Forteo - 2 years  - 2003 - Prolia - started 2012 (total of 7 years - however, only 1 documented inj/year in 2012, 2014, 2015 and no doses documented in 2017):  10/09/2017 05/14/2018  She continues to have dizziness from allergies, but no vertigo, orthostasis, poor vision.  No falls since last visit.  She has a history of vitamin D insufficiency.  Reviewed available vit D levels: Lab Results  Component Value Date   VD25OH 29.82 (L)  10/06/2017   VD25OH 32 07/04/2009   VD25OH 24 (L) 02/14/2009   Patient is on calcium and vitamin D 500 mg - 200 units 3 times daily supplements.  We also added 1000 units vitamin D daily at last visit. She ran out of this a long time ago.  She does not do weight bearing exercises.   She does not take high vitamin A doses.  No steroid injections.  Menopause was early, at 20-43 y/o.   Pt does have a FH of osteoporosis: Mother- fx'ed ankle, foot.  No history of kidney stones, no hyper/hypocalcemia or hyperparathyroidism. Lab Results  Component Value Date   PTH 25 10/06/2017   PTH Comment 10/06/2017   CALCIUM 9.2 05/14/2018   CALCIUM 9.9 03/05/2018   CALCIUM 10.0 10/06/2017   CALCIUM 9.7 09/03/2017   CALCIUM 9.1 06/05/2017   CALCIUM 9.6 12/02/2016   CALCIUM 9.7 08/15/2016   CALCIUM 9.9 06/07/2016   CALCIUM 8.9 03/22/2016   CALCIUM 9.3 03/21/2016   No history of thyrotoxicosis. She has a h/o thyroid nodules >> now s/p total thyroidectomy, on LT4.  Latest TSH was normal: Lab Results  Component Value Date   TSH 2.92 05/14/2018   TSH 5.40 (H) 03/05/2018   TSH 1.14 06/05/2017   TSH 1.770 08/13/2016   TSH 1.38 09/07/2015   No history of CKD. Last BUN/Cr: Lab Results  Component Value Date   BUN 22 05/14/2018   CREATININE 1.07 05/14/2018   She also has a h/o HTN.  ROS: Constitutional: +  weight gain/no weight loss, no fatigue, no subjective hyperthermia, no subjective hypothermia Eyes: no blurry vision, no xerophthalmia ENT: no sore throat, no nodules palpated in neck, no dysphagia, no odynophagia, no hoarseness Cardiovascular: no CP/no SOB/no palpitations/no leg swelling Respiratory: no cough/no SOB/no wheezing Gastrointestinal: no N/no V/no D/no C/no acid reflux Musculoskeletal: no muscle aches/no joint aches Skin: no rashes, no hair loss Neurological: no tremors/no numbness/no tingling/no dizziness  I reviewed pt's medications, allergies, PMH, social hx, family hx,  and changes were documented in the history of present illness. Otherwise, unchanged from my initial visit note.  Past Medical History:  Diagnosis Date  . Atrioventricular block, complete ([place])    a. 08/2011 Upgrage of PPM to MDT Adapta L Dual Chamber PPM ser # [id number] H.  . Cardiac pacemaker in situ   . Contact dermatitis and eczema    unspec cause  . Dairy product intolerance   . Depression   . Diastolic dysfunction   . GERD (gastroesophageal reflux disease)   . Hiatal hernia   . History of CHF (congestive heart failure)   . History of diverticulitis of colon    2003  perforated diverticulitis w/ surgical intervention  . History of kidney stones    age 61  . History of peripheral edema    lower extremities  . History of thyroid nodule    multinodular goiter  s/p  total thyroidectomy 2014  . HTN (hypertension)   . Hydronephrosis, left   . Hypothyroidism   . Mild sleep apnea    per study 04-14-2014  . Nocturia   . Osteoporosis   . PAF (paroxysmal atrial fibrillation) ([place])   . PONV (postoperative nausea and vomiting)   . Pulmonary nodules    stable per last ct  . Renal cell carcinoma of left kidney ([facility])   . Shortness of breath    intermittant  . Superficial thrombophlebitis    Past Surgical History:  Procedure Laterality Date  . CARDIOVASCULAR STRESS TEST  04-20-2015  dr Caryl Comes   normal nuclear study/  no ischemia/  normal LV function and wall motion, ef 74%  . COLONOSCOPY  01/19/2013  . COLOSTOMY TAKEDOWN  07-22-2002  . CYSTOSCOPY WITH RETROGRADE PYELOGRAM, URETEROSCOPY AND STENT PLACEMENT Left 07/28/2015   Procedure: CYSTOSCOPY WITH LEFT RETROGRADE PYELOGRAM, POSSIBLE LEFT URETEROSCOPY AND STENT PLACEMENT;  Surgeon: Franchot Gallo, MD;  Location: [facility];  Service: Urology;  Laterality: Left;  . EXPLORATORY LAPARTOMY SIGMOID COLECTOMY/ COLOSTOMY  04-28-2002   perforated diverticulitis  . KYPHOPLASTY    . LAPAROSCOPY VENTRAL HERNIA REPAIR/   EXTENSIVE LYSIS ADHESIONS  05-04-2004  . PACEMAKER GENERATOR CHANGE Left 09/16/2011   MDT [place] pacemaker  . PACEMAKER PLACEMENT  05-11-2003     medtronic  . ROBOT ASSISTED LAPAROSCOPIC NEPHRECTOMY Left 03/20/2016   Procedure: XI ROBOTIC ASSISTED LAPAROSCOPIC RETROPERITONEAL NEPHRECTOMY;  Surgeon: Alexis Frock, MD;  Location: [facility];  Service: Urology;  Laterality: Left;  . THYROIDECTOMY N/A 08/07/2012   Procedure: THYROIDECTOMY;  Surgeon: Earnstine Regal, MD;  Location: [facility];  Service: General;  Laterality: N/A;  . TRANSTHORACIC ECHOCARDIOGRAM  10-12-2014   moderate focal basal LVH,  ef 55-60%,  grade 2 diastolic dysfunction, mild AV calcification without stenosis,  mild MR,  trivial PR   Social History   Socioeconomic History  . Marital status: Single    Spouse name: Not on file  . Number of children: 2  . Years of education: 68  . Highest education level: Not on file  Occupational  History  . 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 Financial resource strain: Not on file  . Food insecurity:    Worry: Not on file    Inability: Not on file  . Transportation needs:    Medical: Not on file    Non-medical: Not on file  Tobacco Use  . Smoking status: Former Smoker    Years: 15.00    Types: Cigarettes    Last attempt to quit: 10/06/1979    Years since quitting: 39.1  . Smokeless tobacco: Never Used  Substance and Sexual Activity  . Alcohol use: No  . Drug use: No  . Sexual activity: Not on file  Lifestyle  . Physical activity:    Days per week: Not on file    Minutes per session: Not on file  . Stress: Not on file  Relationships  . Social connections:    Talks on phone: Not on file    Gets together: Not on file    Attends religious service: Not on file    Active member of club or organization: Not on file    Attends meetings of clubs or organizations: Not on file    Relationship status: Not on file  . Intimate  partner violence:    Fear of current or ex partner: Not on file    Emotionally abused: Not on file    Physically abused: Not on file    Forced sexual activity: Not on file  Other Topics Concern  . 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  She was married for 22 years, divorced, and has remained single. She has 1 grown son, 1 grown daughter.  She has 3 grandchildren, 2 of her children   live in [place].  She is retired.  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   Current Outpatient Medications on File Prior to Visit  Medication Sig Dispense Refill  . acetaminophen (TYLENOL) 500 MG tablet Take 1,000 mg by mouth daily as needed for moderate pain.    [name] amLODipine (NORVASC) 5 MG tablet TAKE 1 TABLET (5 MG TOTAL) BY MOUTH DAILY. 90 tablet 2  . apixaban (ELIQUIS) 5 MG TABS tablet Take 1 tablet (5 mg total) by mouth 2 (two) times daily. 180 tablet 3  . denosumab (PROLIA) 60 MG/ML SOLN Inject 60 mg into the skin every 6 (six) months. Reported on 09/07/2015    . diclofenac sodium (VOLTAREN) 1 % GEL Apply 2 g topically 4 (four) times daily. 100 g 1  . diphenhydramine-acetaminophen (TYLENOL PM) 25-500 MG TABS tablet Take 2 tablets by mouth at bedtime as needed (sleep/pain).    . fluticasone (FLONASE) 50 MCG/ACT nasal spray PLACE 2 SPRAYS INTO BOTH NOSTRILS DAILY AS NEEDED FOR ALLERGIES OR RHINITIS. 16 g 2  . levocetirizine (XYZAL) 5 MG tablet TAKE 1 TABLET (5 MG TOTAL) BY MOUTH EVERY EVENING. 30 tablet 3  . levothyroxine (SYNTHROID, LEVOTHROID) 100 MCG tablet Take 1 tablet (100 mcg total) by mouth daily before breakfast. 90 tablet 1  . lisinopril-hydrochlorothiazide (ZESTORETIC) 20-12.5 MG tablet TAKE 2 TABLETS BY MOUTH DAILY 180 tablet 0  . loratadine (CLARITIN) 10 MG tablet TAKE 1 TABLET (10 MG TOTAL) BY  MOUTH DAILY  AS NEEDED FOR ALLERGIES. 100 tablet 2  . predniSONE (DELTASONE) 10 MG tablet Take 1 tablet (10 mg total) by mouth daily with breakfast. (Patient not taking: Reported on 11/03/2018) 15 tablet 0  . predniSONE (DELTASONE) 10 MG tablet TAKE 3 TABLETS PO QD FOR 3 DAYS THEN TAKE 2 TABLETS PO QD FOR 3 DAYS THEN TAKE 1 TABLET PO QD FOR 3 DAYS THEN TAKE 1/2 TAB PO QD FOR 3 DAYS 20 tablet 0  . tiZANidine (ZANAFLEX) 4 MG tablet Take 1 tablet (4 mg total) by mouth every 6 (six) hours as needed for muscle spasms. 30 tablet 0   Current Facility-Administered Medications on File Prior to Visit  Medication Dose Route Frequency Provider Last Rate Last Dose  . aminophylline injection 150 mg  150 mg Intravenous BID PRN Dorothy Spark, MD   150 mg at 04/20/15 1200   Allergies  Allergen Reactions  . Tape Rash  . Codeine Nausea And Vomiting   Family History  Problem Relation Age of Onset  . Heart disease Mother   . Stroke Mother   . Hypertension Mother   . Heart failure Father   . Hypertension Brother   . Heart disease Brother   . Diverticulosis Sister   . Hypertension Brother   . Cancer Son        breast  . Heart attack Neg Hx     PE: There were no vitals taken for this visit. Wt Readings from Last 3 Encounters:  11/03/18 148 lb 6.4 oz (67.3 kg)  04/09/18 147 lb 6.4 oz (66.9 kg)  03/05/18 144 lb 3.2 oz (65.4 kg)   Constitutional:  in NAD  The physical exam was not performed (virtual visit).  Assessment: 1. Osteoporosis  2.  Vitamin D insufficiency  Plan: 1. Osteoporosis -Likely postmenopausal (early menopause, no HRT) + age-related and she also has family history of osteoporosis -Reviewed her last 3 DEXA scan reports and I explained that based on T-scores, she has an increased risk for fracture.  She already had several vertebral compression fractures so her risk is quite high.  We discussed at last visit about possible causes for vertebral fractures (early menopause, missed  Prolia injections).  She did not have a history of excessive steroid use, no history of hyperparathyroidism (this was rechecked at last visit), no history of severe vitamin D deficiency, no RA, no signs of Cushing syndrome, no systemic mastocytosis, no history of cancer, no renal failure to indicate multiple myeloma, no history of low alkaline phosphatase, no previous fractures before her vertebral once or blue sclerae, to indicate osteogenesis imperfecta.  At last visit, we reviewed her Prolia injection schedule and I explained that whenever she lives more than 6 months to pass between her injections, she starts to have an increased risk for fracture as her BMD decreases precipitously.  Her to latest BMD evaluations showed stable T-scores after she started to take Prolia consistently.  In total, she was on Prolia for 8 years, however, she missed doses.  Latest injection was 05/14/2018 and she is due for another injection now. -We discussed about continuing Prolia for now for a total of 10 years, then give her 1-2 doses of Reclast IV.  Of note, she has already been on 2 years of Forteo.  Therefore, we do not have many options, but Romosozumab is now out on the market and we could use this if absolutely needed.  We discussed about the possibly higher risk for stroke and cardiovascular events especially  in patients that have had a history of cardiovascular disease. - She is getting a good amount of calcium in her diet.  We actually reduced her intake to 500 mg 3 times a day before last visit. -We discussed fall precautions and I advised her to do balance and weightbearing exercises -Will check the following labs with next lab draw - in 2 mo: BMP, vitamin D -As mentioned above, we will give her the Prolia injection as soon as she returns to the clinic (now coronavirus pandemic). -She is due for another DXA scan after 05/30/2019.  We discussed that the first indication that the treatment is working is her not  having fractures.  Improvement in BMD or stable BMD is a surrogate outcome. -I will see her back in a year.  2.  Vitamin D insufficiency -Vitamin D level was slightly low at last visit -We added 1000 units vitamin D daily, but did not take it beyond 1st med bottle - forgot -advised her to start 2000 units daily -Recheck level in 2 mo  Orders Placed This Encounter  Procedures  . BASIC METABOLIC PANEL WITH GFR  . VITAMIN D 25 Hydroxy (Vit-D Deficiency, Fractures)    Philemon Kingdom, MD PhD [facility] Endocrinology

## 2018-11-16 ENCOUNTER — Other Ambulatory Visit: Payer: Self-pay

## 2018-11-16 ENCOUNTER — Encounter: Payer: Self-pay | Admitting: Internal Medicine

## 2018-11-16 ENCOUNTER — Ambulatory Visit (INDEPENDENT_AMBULATORY_CARE_PROVIDER_SITE_OTHER): Payer: Medicare Other | Admitting: Internal Medicine

## 2018-11-16 DIAGNOSIS — M810 Age-related osteoporosis without current pathological fracture: Secondary | ICD-10-CM | POA: Diagnosis not present

## 2018-11-16 DIAGNOSIS — E559 Vitamin D deficiency, unspecified: Secondary | ICD-10-CM

## 2018-11-16 NOTE — Patient Instructions (Signed)
Please continue Prolia for now.  Please give [place] a call if we do not contact you for the Prolia injection in the next week.  Start Vitamin D 2000 units daily.  Please come back for labs in 2 months.  Please come back for a follow-up appointment in 1 year.

## 2018-11-17 NOTE — Telephone Encounter (Signed)
Patient is scheduled for Prolia in jection on 11/19/18

## 2018-11-19 ENCOUNTER — Encounter: Payer: Self-pay | Admitting: Internal Medicine

## 2018-11-19 ENCOUNTER — Ambulatory Visit (INDEPENDENT_AMBULATORY_CARE_PROVIDER_SITE_OTHER): Payer: Medicare Other

## 2018-11-19 ENCOUNTER — Other Ambulatory Visit: Payer: Self-pay

## 2018-11-19 DIAGNOSIS — M810 Age-related osteoporosis without current pathological fracture: Secondary | ICD-10-CM | POA: Diagnosis not present

## 2018-11-19 MED ORDER — DENOSUMAB 60 MG/ML ~~LOC~~ SOSY
60.0000 mg | PREFILLED_SYRINGE | Freq: Once | SUBCUTANEOUS | Status: AC
  Administered 2018-11-19: 14:00:00 60 mg via SUBCUTANEOUS

## 2018-11-19 NOTE — Progress Notes (Signed)
Per orders of Dr. Gherghe injection of Prolia given today by Melissa, Certified Medical Assistant . Patient tolerated injection well.  

## 2018-11-20 NOTE — Telephone Encounter (Signed)
Spoke with the front desk and although the accounts were very different from the front office perspective compared to the patients. I did let them know how the patient perceived the discussion.  Spoke with pt and apologized for the inconvenience and for the way she felt. She appreciated the call and was fine upon getting off the line.

## 2018-11-25 ENCOUNTER — Other Ambulatory Visit: Payer: Self-pay | Admitting: Family Medicine

## 2018-11-25 MED FILL — LEVOCETIRIZINE 5 MG TABLET: 5 | 30 days supply | Qty: 30 | Fill #2

## 2018-11-27 NOTE — Progress Notes (Signed)
Remote pacemaker transmission.   

## 2018-11-28 ENCOUNTER — Encounter: Payer: Self-pay | Admitting: Internal Medicine

## 2018-12-07 IMAGING — DX DG HIP (WITH OR WITHOUT PELVIS) 1V*L*
3 series · 3 of 3 positions shown · non-contrast
Comparison: None.

CLINICAL DATA: Chronic low back pain. Spinal augmentation at
multiple levels. Left hip pain.

EXAM:
DG HIP (WITH OR WITHOUT PELVIS) 1V*L*

[pelvis ap]
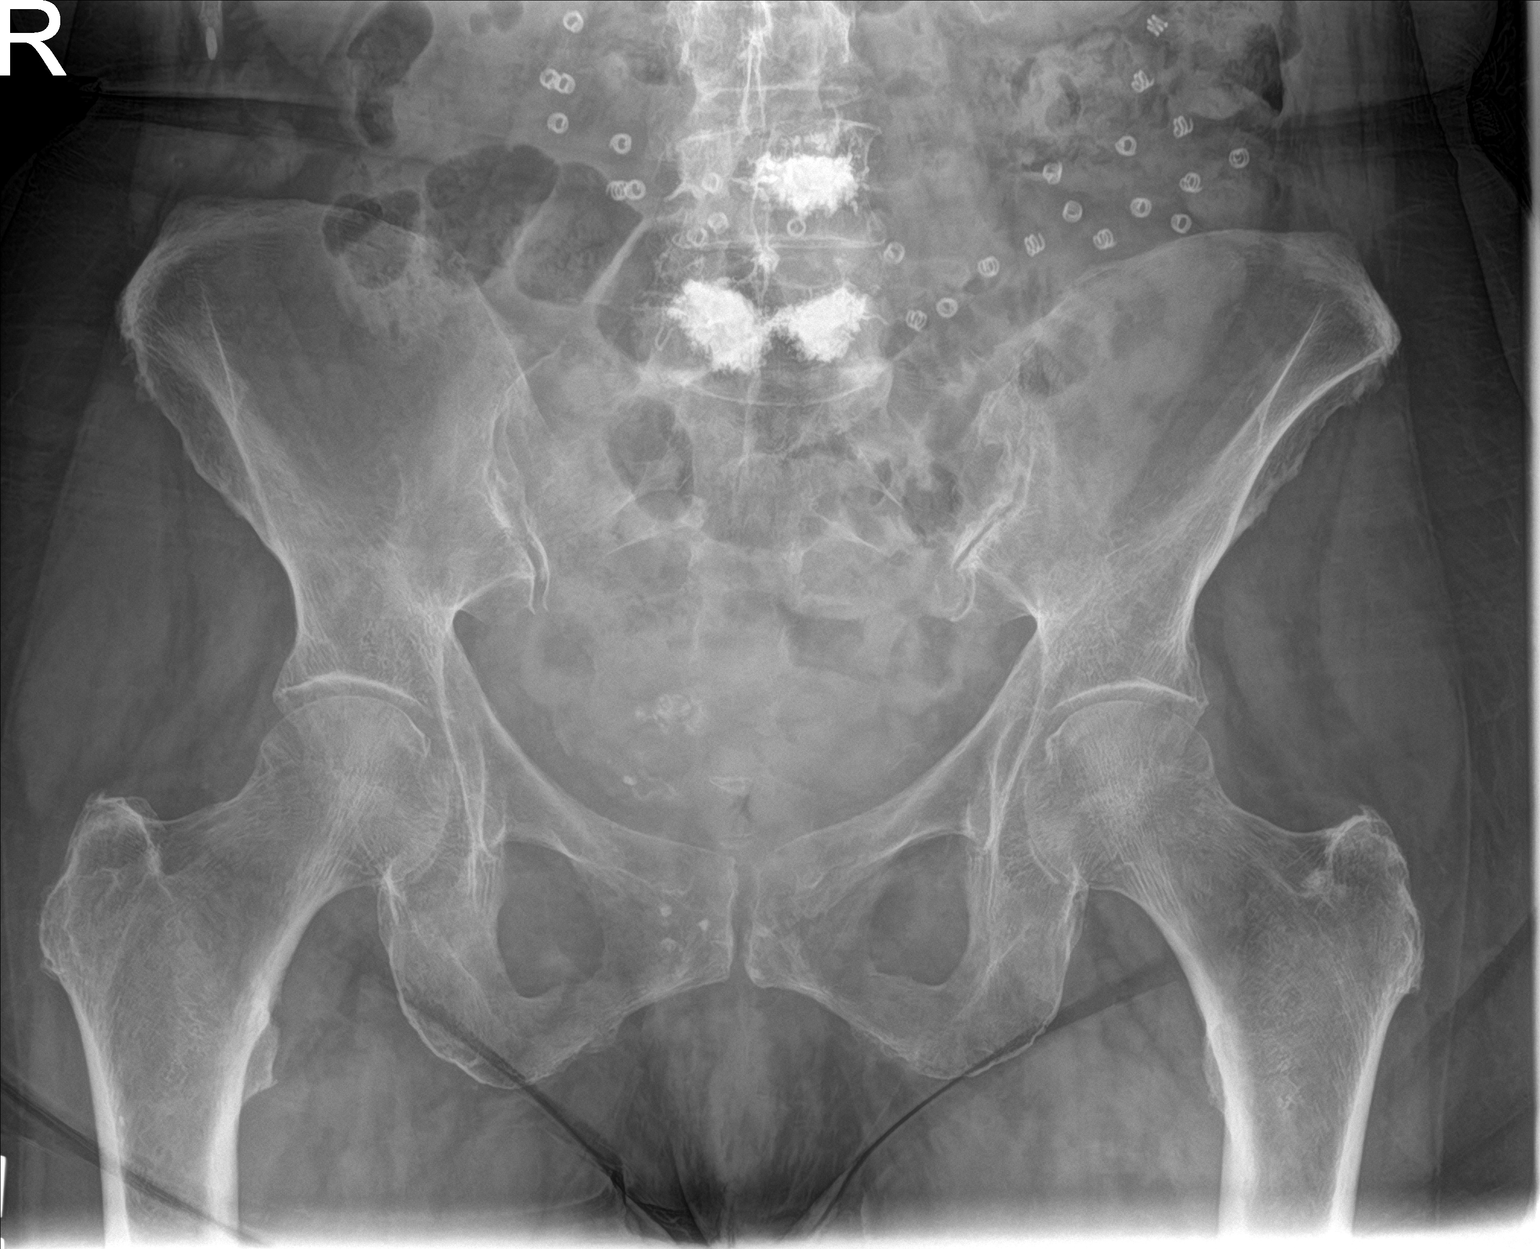

[hip ap]
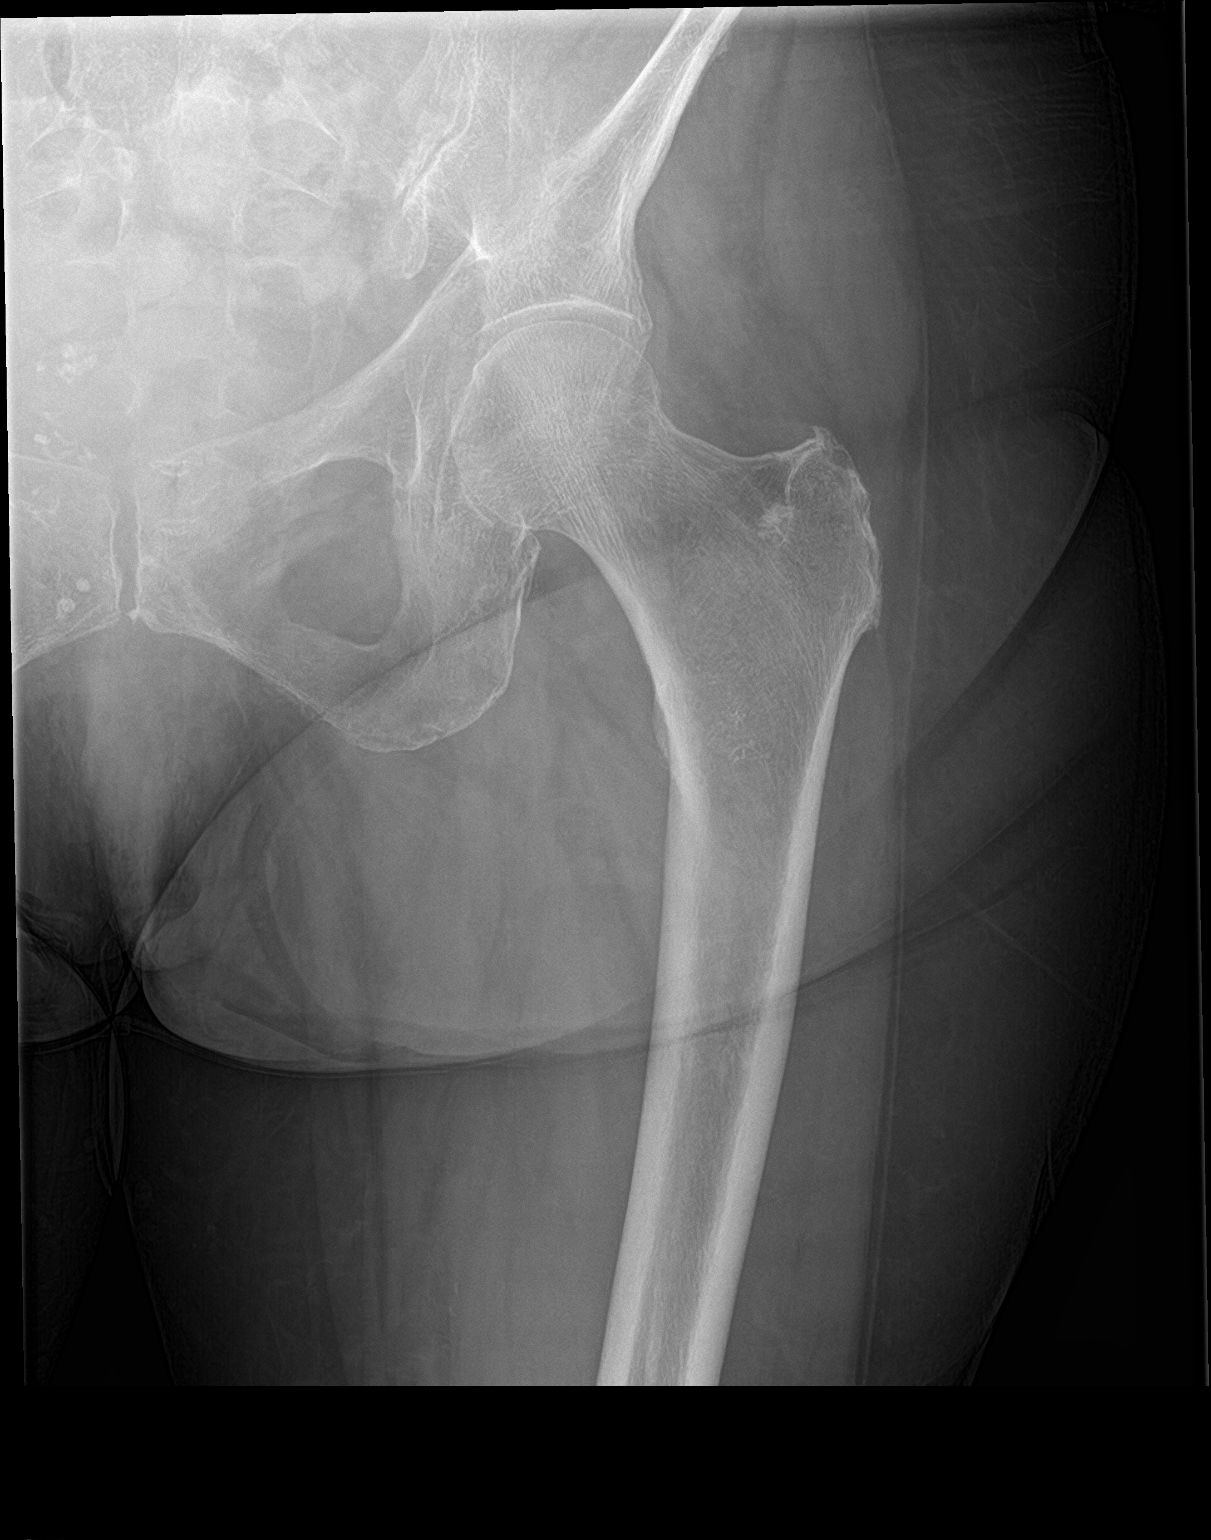

[hip lat]
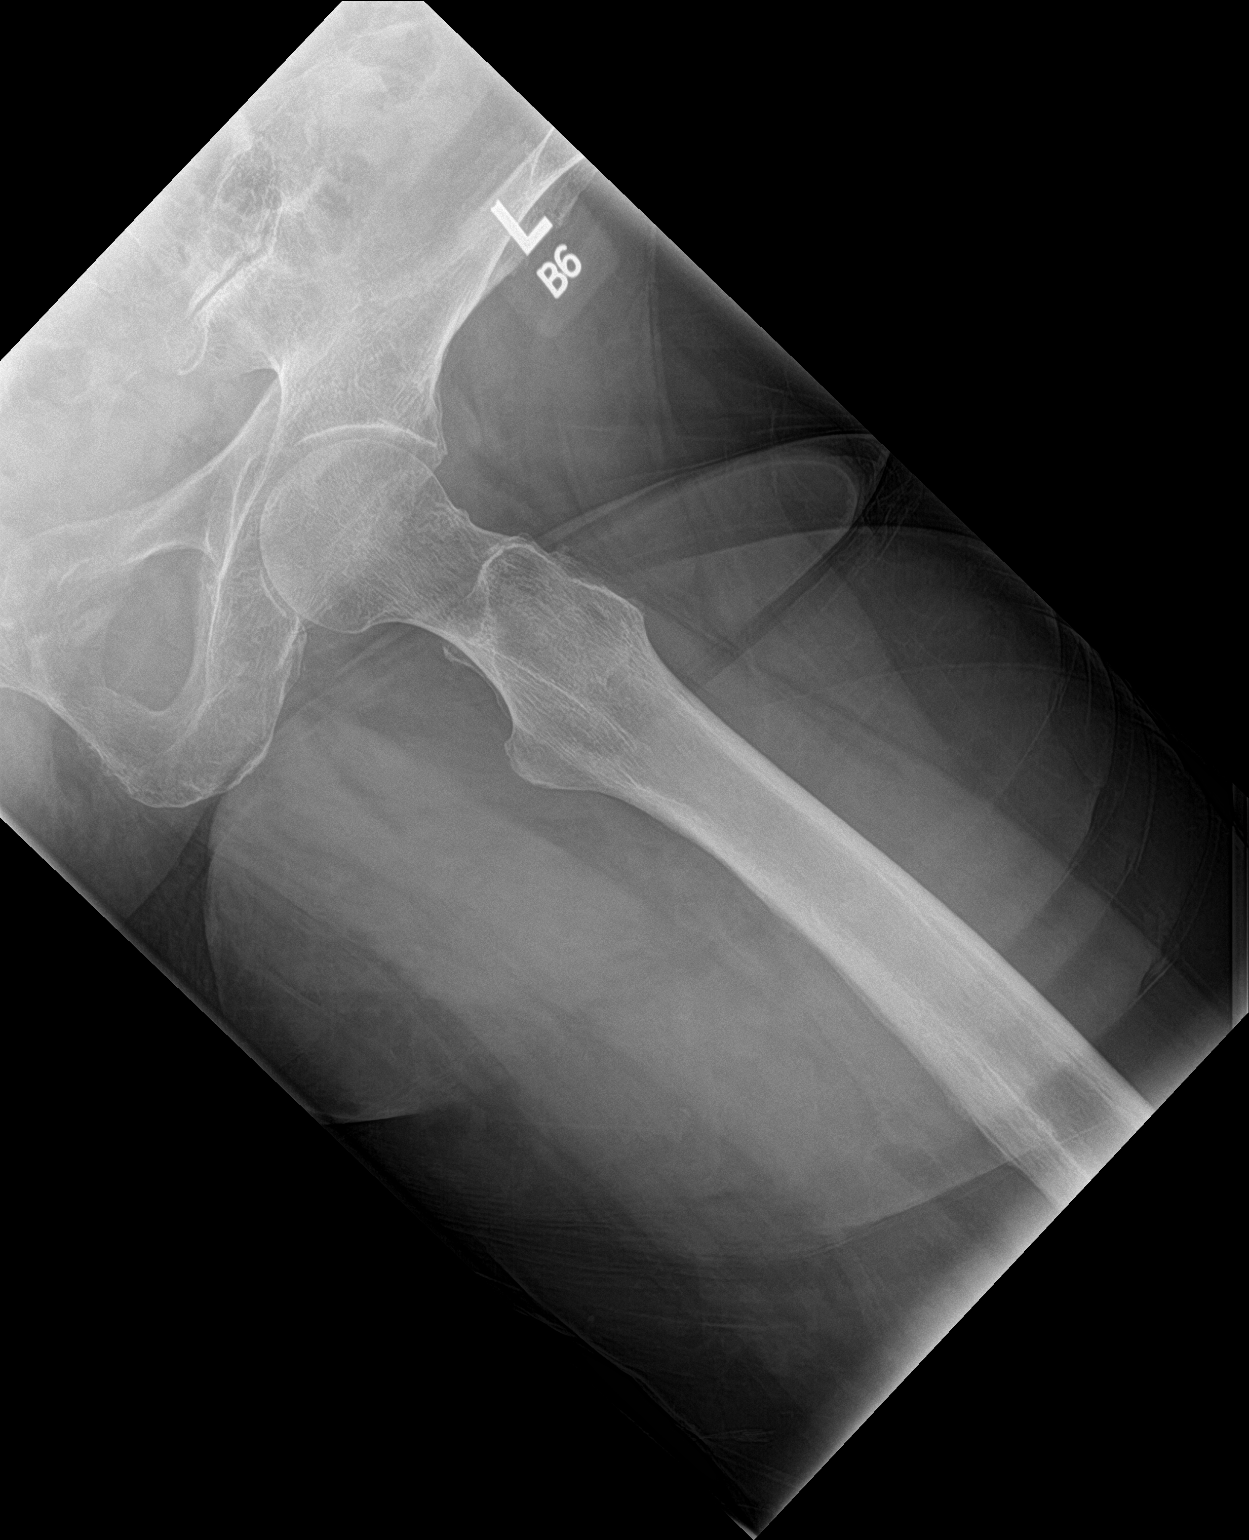

[3 of 3 positions shown; findings below may reference images not displayed]

FINDINGS: Left hip is located. Acute or healing fractures are present. Mild
degenerative changes are noted at the greater trochanter.
Degenerative changes are also noted at the SI joints bilaterally,
left greater than right. Spinal mutation is evident at L4 and L5.
IMPRESSION: 1. Mild degenerative changes in the left hip.
2. No acute abnormality.

## 2018-12-22 MED FILL — LEVOCETIRIZINE 5 MG TABLET: 5 | 30 days supply | Qty: 30 | Fill #3

## 2019-01-11 MED FILL — SM ALLERGY RELIEF 50 MCG SP: 50 MCG | 30 days supply | Qty: 16 | Fill #1

## 2019-01-18 ENCOUNTER — Other Ambulatory Visit: Payer: Self-pay

## 2019-01-18 ENCOUNTER — Other Ambulatory Visit (INDEPENDENT_AMBULATORY_CARE_PROVIDER_SITE_OTHER): Payer: Medicare Other

## 2019-01-18 DIAGNOSIS — M810 Age-related osteoporosis without current pathological fracture: Secondary | ICD-10-CM

## 2019-01-18 DIAGNOSIS — E559 Vitamin D deficiency, unspecified: Secondary | ICD-10-CM

## 2019-01-18 LAB — VITAMIN D 25 HYDROXY (VIT D DEFICIENCY, FRACTURES): VITD: 37.32 ng/mL (ref 30.00–100.00)

## 2019-01-19 ENCOUNTER — Encounter: Payer: Self-pay | Admitting: Internal Medicine

## 2019-01-19 ENCOUNTER — Other Ambulatory Visit: Payer: Medicare Other

## 2019-01-19 LAB — BASIC METABOLIC PANEL WITH GFR
BUN/Creatinine Ratio: 27 (calc) — ABNORMAL HIGH (ref 6–22)
BUN: 29 mg/dL — ABNORMAL HIGH (ref 7–25)
CO2: 27 mmol/L (ref 20–32)
Calcium: 9.5 mg/dL (ref 8.6–10.4)
Chloride: 95 mmol/L — ABNORMAL LOW (ref 98–110)
Creat: 1.08 mg/dL — ABNORMAL HIGH (ref 0.60–0.88)
GFR, Est African American: 56 mL/min/{1.73_m2} — ABNORMAL LOW (ref >=60)
GFR, Est Non African American: 48 mL/min/{1.73_m2} — ABNORMAL LOW (ref >=60)
Glucose, Bld: 95 mg/dL (ref 65–99)
Potassium: 3.9 mmol/L (ref 3.5–5.3)
Sodium: 131 mmol/L — ABNORMAL LOW (ref 135–146)

## 2019-01-24 ENCOUNTER — Other Ambulatory Visit: Payer: Self-pay | Admitting: Family Medicine

## 2019-01-24 ENCOUNTER — Encounter: Payer: Self-pay | Admitting: Internal Medicine

## 2019-01-24 DIAGNOSIS — E871 Hypo-osmolality and hyponatremia: Secondary | ICD-10-CM

## 2019-01-25 ENCOUNTER — Encounter: Payer: Self-pay | Admitting: Family Medicine

## 2019-01-26 ENCOUNTER — Other Ambulatory Visit: Payer: Self-pay

## 2019-01-26 MED ORDER — HYDROCHLOROTHIAZIDE 12.5 MG PO TABS: 12.5000 mg | ORAL_TABLET | Freq: Every day | ORAL | 1 refills | Status: DC

## 2019-01-26 MED ORDER — LISINOPRIL 40 MG PO TABS: 40.0000 mg | ORAL_TABLET | Freq: Every day | ORAL | 1 refills | Status: DC

## 2019-01-26 MED FILL — LISINOPRIL 40 MG TABLET: 40 | 90 days supply | Qty: 90 | Fill #0

## 2019-01-26 MED FILL — HYDROCHLOROTHIAZIDE 12.5 MG: 12.5 | 90 days supply | Qty: 90 | Fill #0

## 2019-01-27 ENCOUNTER — Other Ambulatory Visit: Payer: Self-pay | Admitting: Family Medicine

## 2019-01-27 MED FILL — AMLODIPINE BESYLATE 5 MG TA: 5 | 90 days supply | Qty: 90 | Fill #2

## 2019-01-27 MED FILL — LEVOCETIRIZINE 5 MG TABLET: 5 | 30 days supply | Qty: 30 | Fill #0

## 2019-01-29 ENCOUNTER — Encounter: Payer: Self-pay | Admitting: Family Medicine

## 2019-02-03 ENCOUNTER — Encounter: Payer: Self-pay | Admitting: Family Medicine

## 2019-02-04 MED FILL — LORATADINE 10 MG TABLET: 10 | 100 days supply | Qty: 100 | Fill #2

## 2019-02-09 ENCOUNTER — Ambulatory Visit (INDEPENDENT_AMBULATORY_CARE_PROVIDER_SITE_OTHER): Payer: Medicare Other | Admitting: *Deleted

## 2019-02-09 DIAGNOSIS — I442 Atrioventricular block, complete: Secondary | ICD-10-CM

## 2019-02-11 ENCOUNTER — Other Ambulatory Visit: Payer: Self-pay

## 2019-02-11 LAB — CUP PACEART REMOTE DEVICE CHECK
Battery Impedance: 1132 Ohm
Battery Remaining Longevity: 50 mo
Battery Voltage: 2.76 V
Brady Statistic AP VP Percent: 1 %
Brady Statistic AP VS Percent: 0 %
Brady Statistic AS VP Percent: 99 %
Brady Statistic AS VS Percent: 0 %
Date Time Interrogation Session: 20200812180535
Implantable Lead Implant Date: 20041110
Implantable Lead Implant Date: 20041110
Implantable Lead Location: 753859
Implantable Lead Location: 753860
Implantable Pulse Generator Implant Date: 20130318
Lead Channel Impedance Value: 374 Ohm
Lead Channel Impedance Value: 495 Ohm
Lead Channel Pacing Threshold Amplitude: 0.5 V
Lead Channel Pacing Threshold Amplitude: 1 V
Lead Channel Pacing Threshold Pulse Width: 0.4 ms
Lead Channel Pacing Threshold Pulse Width: 0.4 ms
Lead Channel Setting Pacing Amplitude: 2 V
Lead Channel Setting Pacing Amplitude: 2.5 V
Lead Channel Setting Pacing Pulse Width: 0.4 ms
Lead Channel Setting Sensing Sensitivity: 4 mV

## 2019-02-15 ENCOUNTER — Other Ambulatory Visit: Payer: Medicare Other

## 2019-02-15 ENCOUNTER — Ambulatory Visit (INDEPENDENT_AMBULATORY_CARE_PROVIDER_SITE_OTHER): Payer: Medicare Other | Admitting: Family Medicine

## 2019-02-15 ENCOUNTER — Other Ambulatory Visit: Payer: Self-pay

## 2019-02-15 ENCOUNTER — Encounter: Payer: Self-pay | Admitting: Family Medicine

## 2019-02-15 VITALS — BP 158/72 | HR 80 | Temp 97.3°F | Resp 18 | Ht 63.0 in | Wt 151.0 lb

## 2019-02-15 DIAGNOSIS — H00014 Hordeolum externum left upper eyelid: Secondary | ICD-10-CM | POA: Diagnosis not present

## 2019-02-15 DIAGNOSIS — E871 Hypo-osmolality and hyponatremia: Secondary | ICD-10-CM

## 2019-02-15 MED ORDER — ERYTHROMYCIN 5 MG/GM OP OINT: 1.0000 "application " | TOPICAL_OINTMENT | Freq: Three times a day (TID) | OPHTHALMIC | 0 refills | Status: DC

## 2019-02-15 MED FILL — ERYTHROMYCIN EYE OINTMENT: 5 | 10 days supply | Qty: 4 | Fill #0

## 2019-02-15 NOTE — Patient Instructions (Signed)

## 2019-02-15 NOTE — Assessment & Plan Note (Signed)
Recurrent Warm compresses abx ointment F/u opth

## 2019-02-15 NOTE — Progress Notes (Signed)
Patient ID: [name], female    DOB: Nov 04, 1940  Age: 78 y.o. MRN: [id number]    Subjective:  Subjective  HPI [name] presents for stye L eye that is almost gone now but it keeps coming back.  It gets very big at times as well.   No other complaints.    Review of Systems  Constitutional: Negative for activity change, appetite change, fatigue and unexpected weight change.  Respiratory: Negative for cough and shortness of breath.   Cardiovascular: Negative for chest pain and palpitations.  Psychiatric/Behavioral: Negative for behavioral problems and dysphoric mood. The patient is not nervous/anxious.     History Past Medical History:  Diagnosis Date  . Atrioventricular block, complete ([place])    a. 08/2011 Upgrage of PPM to MDT Adapta L Dual Chamber PPM ser # [id number] H.  . Cardiac pacemaker in situ   . Contact dermatitis and eczema    unspec cause  . Dairy product intolerance   . Depression   . Diastolic dysfunction   . GERD (gastroesophageal reflux disease)   . Hiatal hernia   . History of CHF (congestive heart failure)   . History of diverticulitis of colon    2003  perforated diverticulitis w/ surgical intervention  . History of kidney stones    age 31  . History of peripheral edema    lower extremities  . History of thyroid nodule    multinodular goiter  s/p  total thyroidectomy 2014  . HTN (hypertension)   . Hydronephrosis, left   . Hypothyroidism   . Mild sleep apnea    per study 04-14-2014  . Nocturia   . Osteoporosis   . PAF (paroxysmal atrial fibrillation) ([place])   . PONV (postoperative nausea and vomiting)   . Pulmonary nodules    stable per last ct  . Renal cell carcinoma of left kidney ([facility])   . Shortness of breath    intermittant  . Superficial thrombophlebitis     She has a past surgical history that includes Thyroidectomy (N/A, 08/07/2012); Colonoscopy (01/19/2013); pacemaker generator change (Left, 09/16/2011); pacemaker placement (05-11-2003   ); EXPLORATORY LAPARTOMY SIGMOID COLECTOMY/ COLOSTOMY (04-28-2002); Colostomy takedown (07-22-2002); LAPAROSCOPY VENTRAL HERNIA REPAIR/  EXTENSIVE LYSIS ADHESIONS (05-04-2004); Cardiovascular stress test (04-20-2015  dr Caryl Comes); transthoracic echocardiogram (10-12-2014); Cystoscopy with retrograde pyelogram, ureteroscopy and stent placement (Left, 07/28/2015); Robot assisted laparoscopic nephrectomy (Left, 03/20/2016); and Kyphoplasty.   [name] family history includes Cancer in [name] son; Diverticulosis in [name] sister; Heart disease in [name] brother and mother; Heart failure in [name] father; Hypertension in [name] brother, brother, and mother; Stroke in [name] mother.She reports that she quit smoking about 39 years ago. [name] smoking use included cigarettes. She quit after 15.00 years of use. She has never used smokeless tobacco. She reports that she does not drink alcohol or use drugs.  Current Outpatient Medications on File Prior to Visit  Medication Sig Dispense Refill  . acetaminophen (TYLENOL) 500 MG tablet Take 1,000 mg by mouth daily as needed for moderate pain.    [name] amLODipine (NORVASC) 5 MG tablet TAKE 1 TABLET (5 MG TOTAL) BY MOUTH DAILY. 90 tablet 2  . apixaban (ELIQUIS) 5 MG TABS tablet Take 1 tablet (5 mg total) by mouth 2 (two) times daily. 180 tablet 3  . denosumab (PROLIA) 60 MG/ML SOLN Inject 60 mg into the skin every 6 (six) months. Reported on 09/07/2015    . diclofenac sodium (VOLTAREN) 1 % GEL Apply 2 g topically 4 (four) times daily. 100  g 1  . diphenhydramine-acetaminophen (TYLENOL PM) 25-500 MG TABS tablet Take 2 tablets by mouth at bedtime as needed (sleep/pain).    . fluticasone (FLONASE) 50 MCG/ACT nasal spray PLACE 2 SPRAYS INTO BOTH NOSTRILS DAILY AS NEEDED FOR ALLERGIES OR RHINITIS. 16 g 2  . hydrochlorothiazide (HYDRODIURIL) 12.5 MG tablet Take 1 tablet (12.5 mg total) by mouth daily. 90 tablet 1  . levocetirizine (XYZAL) 5 MG tablet TAKE 1 TABLET (5 MG TOTAL) BY MOUTH EVERY EVENING. 30 tablet  3  . levothyroxine (SYNTHROID) 100 MCG tablet TAKE 1 TABLET BY MOUTH ONCE DAILY BEFORE BREAKFAST 90 tablet 0  . lisinopril (ZESTRIL) 40 MG tablet Take 1 tablet (40 mg total) by mouth daily. 90 tablet 1  . loratadine (CLARITIN) 10 MG tablet TAKE 1 TABLET (10 MG TOTAL) BY MOUTH DAILY AS NEEDED FOR ALLERGIES. 100 tablet 2  . predniSONE (DELTASONE) 10 MG tablet Take 1 tablet (10 mg total) by mouth daily with breakfast. (Patient not taking: Reported on 02/15/2019) 15 tablet 0  . predniSONE (DELTASONE) 10 MG tablet TAKE 3 TABLETS PO QD FOR 3 DAYS THEN TAKE 2 TABLETS PO QD FOR 3 DAYS THEN TAKE 1 TABLET PO QD FOR 3 DAYS THEN TAKE 1/2 TAB PO QD FOR 3 DAYS (Patient not taking: Reported on 02/15/2019) 20 tablet 0  . tiZANidine (ZANAFLEX) 4 MG tablet Take 1 tablet (4 mg total) by mouth every 6 (six) hours as needed for muscle spasms. (Patient not taking: Reported on 02/15/2019) 30 tablet 0   Current Facility-Administered Medications on File Prior to Visit  Medication Dose Route Frequency Provider Last Rate Last Dose  . aminophylline injection 150 mg  150 mg Intravenous BID PRN Dorothy Spark, MD   150 mg at 04/20/15 1200     Objective:  Objective  Physical Exam Vitals signs and nursing note reviewed.  Eyes:     General:        Right eye: No hordeolum.        Left eye: Hordeolum present.   BP (!) 158/72 (BP Location: Left Arm, Patient Position: Sitting, Cuff Size: Normal)   Pulse 80   Temp (!) 97.3 F (36.3 C) (Temporal)   Resp 18   Ht 5\' 3"  (1.6 m)   Wt 151 lb (68.5 kg)   SpO2 100%   BMI 26.75 kg/m  Wt Readings from Last 3 Encounters:  02/15/19 151 lb (68.5 kg)  11/03/18 148 lb 6.4 oz (67.3 kg)  04/09/18 147 lb 6.4 oz (66.9 kg)     Lab Results  Component Value Date   WBC 5.4 03/05/2018   HGB 13.0 03/05/2018   HCT 38.6 03/05/2018   PLT 166.0 03/05/2018   GLUCOSE 95 01/18/2019   CHOL 147 09/03/2017   TRIG 56.0 09/03/2017   HDL 50.80 09/03/2017   LDLCALC 85 09/03/2017   ALT 11  03/05/2018   AST 22 03/05/2018   NA 131 (L) 01/18/2019   K 3.9 01/18/2019   CL 95 (L) 01/18/2019   CREATININE 1.08 (H) 01/18/2019   BUN 29 (H) 01/18/2019   CO2 27 01/18/2019   TSH 2.92 05/14/2018   INR 1.01 11/04/2013   HGBA1C 5.5 06/07/2016    Dg Lumbar Spine 2-3 Views  Result Date: 04/09/2018 CLINICAL DATA:  Chronic low back pain. History of kyphoplasties. Left hip pain for 2 months. No injury. EXAM: LUMBAR SPINE - 2-3 VIEW COMPARISON:  CT, CT, 07/12/2015 FINDINGS: No acute fracture. Multiple prior fractures have been stabilized with vertebroplasty. Vertebroplasty  cement is seen at T9, T11, T12, L2, L4 and L5. Slight anterolisthesis of L5 on S1.  No other spondylolisthesis. Mild loss of disc height at L3-L4 and L4-L5 with moderate loss of disc height at L5-S1. Bones are diffusely demineralized. IMPRESSION: 1. No acute fracture or acute finding.  No bone lesion. 2. Multiple old fractures stabilized with vertebroplasty. 3. Mild disc degenerative changes. Slight anterolisthesis of L5 on S1. These findings are stable from the prior CT. Electronically Signed   By: Lajean Manes M.D.   On: 04/09/2018 15:42   Dg Hip Unilat With Pelvis 1v Left  Result Date: 04/09/2018 CLINICAL DATA:  Chronic low back pain. Spinal augmentation at multiple levels. Left hip pain. EXAM: DG HIP (WITH OR WITHOUT PELVIS) 1V*L* COMPARISON:  None. FINDINGS: Left hip is located. Acute or healing fractures are present. Mild degenerative changes are noted at the greater trochanter. Degenerative changes are also noted at the SI joints bilaterally, left greater than right. 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 IMPRESSION: 1. 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 2. No acute abnormality. Electronically Signed   By: San Morelle M.D.   On: 04/09/2018 15:40     Assessment & Plan:  Plan  I am having [name] start on erythromycin. I am also having [name] maintain [name] denosumab, diphenhydramine-acetaminophen,  acetaminophen, apixaban, diclofenac sodium, predniSONE, loratadine, amLODipine, fluticasone, predniSONE, tiZANidine, lisinopril, hydrochlorothiazide, levothyroxine, and levocetirizine.  Meds ordered this encounter  Medications  . erythromycin ophthalmic ointment    Sig: Place 1 application into the left eye 3 (three) times daily.    Dispense:  3.5 g    Refill:  0    Problem List Items Addressed This Visit      Unprioritized   Hordeolum externum of left upper eyelid - Primary    Recurrent Warm compresses abx ointment F/u opth      Relevant Medications   erythromycin ophthalmic ointment   Other Relevant Orders   Ambulatory referral to Ophthalmology   Hyponatremia    Repeat labs today         Follow-up: No follow-ups on file.  [name], DO    -

## 2019-02-15 NOTE — Assessment & Plan Note (Signed)
Repeat labs today

## 2019-02-16 LAB — BASIC METABOLIC PANEL
BUN: 17 mg/dL (ref 6–23)
CO2: 28 mEq/L (ref 19–32)
Calcium: 9.5 mg/dL (ref 8.4–10.5)
Chloride: 96 mEq/L (ref 96–112)
Creatinine, Ser: 1.02 mg/dL (ref 0.40–1.20)
GFR: 63.01 mL/min (ref >60.00)
Glucose, Bld: 88 mg/dL (ref 70–99)
Potassium: 4.5 mEq/L (ref 3.5–5.1)
Sodium: 130 mEq/L — ABNORMAL LOW (ref 135–145)

## 2019-02-17 ENCOUNTER — Encounter: Payer: Self-pay | Admitting: Cardiology

## 2019-02-17 NOTE — Progress Notes (Signed)
Remote pacemaker transmission.   

## 2019-02-19 ENCOUNTER — Encounter: Payer: Self-pay | Admitting: Family Medicine

## 2019-03-11 ENCOUNTER — Ambulatory Visit: Payer: Medicare Other | Admitting: Family Medicine

## 2019-03-11 ENCOUNTER — Encounter: Payer: Self-pay | Admitting: Family Medicine

## 2019-03-11 ENCOUNTER — Ambulatory Visit (INDEPENDENT_AMBULATORY_CARE_PROVIDER_SITE_OTHER): Payer: Medicare Other | Admitting: Family Medicine

## 2019-03-11 ENCOUNTER — Other Ambulatory Visit: Payer: Self-pay

## 2019-03-11 DIAGNOSIS — J069 Acute upper respiratory infection, unspecified: Secondary | ICD-10-CM | POA: Diagnosis not present

## 2019-03-11 DIAGNOSIS — Z889 Allergy status to unspecified drugs, medicaments and biological substances status: Secondary | ICD-10-CM

## 2019-03-11 DIAGNOSIS — D1722 Benign lipomatous neoplasm of skin and subcutaneous tissue of left arm: Secondary | ICD-10-CM | POA: Diagnosis not present

## 2019-03-11 MED ORDER — FLUTICASONE PROPIONATE 50 MCG/ACT NA SUSP: 2.0000 | Freq: Every day | NASAL | 2 refills | Status: DC | PRN

## 2019-03-11 MED FILL — LEVOCETIRIZINE 5 MG TABLET: 5 | 30 days supply | Qty: 30 | Fill #1

## 2019-03-11 MED FILL — FLUTICASONE PROP 50 MCG SPR: 50 | 30 days supply | Qty: 16 | Fill #0

## 2019-03-11 NOTE — Progress Notes (Signed)
Virtual Visit via Video Note  I connected with [name] on 03/11/19 at  9:40 AM EDT by a video enabled telemedicine application and verified that I am speaking with the correct person using two identifiers.  Location: Patient: home  Provider: office   I discussed the limitations of evaluation and management by telemedicine and the availability of in person appointments. The patient expressed understanding and agreed to proceed.  History of Present Illness: Pt woke up this am feeling dizzy, congestion , productive cough , no fever  Pt has flonase and loratidine No sob, no cp Pt also c/o lipoma on L arm that is getting bigger.  No pain-- it is not bothering her It just "doesn't look nice"  Observations/Objective: Afebrile   No other vitals obtained Pt is in NAD L arm-- large lipoma lat left arm ,  Feels rubbery per pt , no pain Assessment and Plan: 1. Viral upper respiratory tract infection con't flonase and loratidine  Can use mucinex as well  If symptoms worsen --- call office - Novel Coronavirus, NAA ([facility])  2. H/O seasonal allergies stable - fluticasone (FLONASE) 50 MCG/ACT nasal spray; Place 2 sprays into both nostrils daily as needed for allergies or rhinitis.  Dispense: 16 g; Refill: 2  3. Lipoma of left upper extremity Pt wants to wait on surgery appointment  She will call when she wants the referral    Follow Up Instructions:    I discussed the assessment and treatment plan with the patient. The patient was provided an opportunity to ask questions and all were answered. The patient agreed with the plan and demonstrated an understanding of the instructions.   The patient was advised to call back or seek an in-person evaluation if the symptoms worsen or if the condition fails to improve as anticipated.  I provided 15 minutes of non-face-to-face time during this encounter.   [name], DO

## 2019-03-11 NOTE — Assessment & Plan Note (Signed)
Pt wants to hold off on surgery app

## 2019-03-15 ENCOUNTER — Telehealth: Payer: Self-pay | Admitting: Family Medicine

## 2019-03-15 ENCOUNTER — Other Ambulatory Visit: Payer: Self-pay

## 2019-03-15 ENCOUNTER — Encounter: Payer: Self-pay | Admitting: Family Medicine

## 2019-03-15 DIAGNOSIS — Z20822 Contact with and (suspected) exposure to covid-19: Secondary | ICD-10-CM

## 2019-03-15 NOTE — Telephone Encounter (Signed)
Daily is ok as long as she is only using it as directed

## 2019-03-15 NOTE — Telephone Encounter (Signed)
Hi Dr Carollee Herter, went for the Covid test today even though I am feeling much better. Thinking it might be allergy/sinus related but wanted to rule the virus out. I have a unrelated question I neglected to ask during our visit last week. Some time ago Percell Miller prescribed Diclofenac gel for my hand & it helped.  I have since use it often for the pain in my back. Are there side effects/issues with using it on a regular basis (i.e. daily)?

## 2019-03-16 NOTE — Telephone Encounter (Signed)
Replied via Mychart.

## 2019-03-17 LAB — NOVEL CORONAVIRUS, NAA: SARS-CoV-2, NAA: NOT DETECTED

## 2019-03-26 ENCOUNTER — Ambulatory Visit (INDEPENDENT_AMBULATORY_CARE_PROVIDER_SITE_OTHER): Payer: Medicare Other

## 2019-03-26 ENCOUNTER — Other Ambulatory Visit: Payer: Self-pay

## 2019-03-26 DIAGNOSIS — Z23 Encounter for immunization: Secondary | ICD-10-CM

## 2019-03-26 NOTE — Progress Notes (Signed)
Here for flu shot

## 2019-04-12 ENCOUNTER — Telehealth: Payer: Self-pay

## 2019-04-12 ENCOUNTER — Encounter: Payer: Self-pay | Admitting: Family Medicine

## 2019-04-12 NOTE — Telephone Encounter (Signed)
   [facility] Pre-operative Risk Assessment    Request for surgical clearance:  1. What type of surgery is being performed? Cataract surgery   2. When is this surgery scheduled? 05/04/19   3. What type of clearance is required (medical clearance vs. Pharmacy clearance to hold med vs. Both)? medical  4. Are there any medications that need to be held prior to surgery and how long? Patient is on eliquis   5. Practice name and name of physician performing surgery? [facility]/Dr. Eulas Post   6. What is your office phone number [phone]    7.   What is your office fax number [phone]  8.   Anesthesia type (None, local, MAC, general) ? None listed   Mady Haagensen 04/12/2019, 9:47 AM  _________________________________________________________________   (provider comments below)

## 2019-04-12 NOTE — Telephone Encounter (Signed)
Does she think she reacted to the cream?  She could try cortisone otc but i'm happy to do virtual or in person

## 2019-04-12 NOTE — Telephone Encounter (Signed)
Clinical pharmacist to review eliquis 

## 2019-04-13 NOTE — Telephone Encounter (Signed)
   Primary Cardiologist: Dr. Caryl Comes  Chart reviewed as part of pre-operative protocol coverage. Cataract extractions are recognized in guidelines as low risk surgeries that do not typically require specific preoperative testing or holding of blood thinner therapy. Therefore, given past medical history and time since last visit, based on ACC/AHA guidelines, [name] would be at acceptable risk for the planned procedure without further cardiovascular testing.   I will route this recommendation to the requesting party via Epic fax function and remove from pre-op pool.  Please call with questions. See recommendation by our clinical pharmacist regarding eliquis  [name], PA 04/13/2019, 11:29 AM

## 2019-04-13 NOTE — Telephone Encounter (Signed)
Per protocol, no need to hold anticoagulation for cataract removal surgery.

## 2019-04-19 MED FILL — LEVOCETIRIZINE 5 MG TABLET: 5 | 30 days supply | Qty: 30 | Fill #2

## 2019-04-19 MED FILL — HYDROCHLOROTHIAZIDE 12.5 MG: 12.5 | 90 days supply | Qty: 90 | Fill #1

## 2019-04-19 MED FILL — LISINOPRIL 40 MG TABLET: 40 | 90 days supply | Qty: 90 | Fill #1

## 2019-05-11 ENCOUNTER — Ambulatory Visit (INDEPENDENT_AMBULATORY_CARE_PROVIDER_SITE_OTHER): Payer: Medicare Other | Admitting: *Deleted

## 2019-05-11 ENCOUNTER — Other Ambulatory Visit: Payer: Self-pay | Admitting: Family Medicine

## 2019-05-11 DIAGNOSIS — I442 Atrioventricular block, complete: Secondary | ICD-10-CM

## 2019-05-11 DIAGNOSIS — I480 Paroxysmal atrial fibrillation: Secondary | ICD-10-CM

## 2019-05-11 DIAGNOSIS — I10 Essential (primary) hypertension: Secondary | ICD-10-CM

## 2019-05-11 MED FILL — AMLODIPINE BESYLATE 5 MG TA: 5 | 90 days supply | Qty: 90 | Fill #0

## 2019-05-12 LAB — CUP PACEART REMOTE DEVICE CHECK
Battery Impedance: 1294 Ohm
Battery Remaining Longevity: 45 mo
Battery Voltage: 2.76 V
Brady Statistic AP VP Percent: 1 %
Brady Statistic AP VS Percent: 0 %
Brady Statistic AS VP Percent: 99 %
Brady Statistic AS VS Percent: 0 %
Date Time Interrogation Session: 20201111163319
Implantable Lead Implant Date: 20041110
Implantable Lead Implant Date: 20041110
Implantable Lead Location: 753859
Implantable Lead Location: 753860
Implantable Pulse Generator Implant Date: 20130318
Lead Channel Impedance Value: 374 Ohm
Lead Channel Impedance Value: 490 Ohm
Lead Channel Pacing Threshold Amplitude: 0.5 V
Lead Channel Pacing Threshold Amplitude: 0.75 V
Lead Channel Pacing Threshold Pulse Width: 0.4 ms
Lead Channel Pacing Threshold Pulse Width: 0.4 ms
Lead Channel Setting Pacing Amplitude: 2 V
Lead Channel Setting Pacing Amplitude: 2.5 V
Lead Channel Setting Pacing Pulse Width: 0.4 ms
Lead Channel Setting Sensing Sensitivity: 4 mV

## 2019-05-13 ENCOUNTER — Other Ambulatory Visit: Payer: Self-pay | Admitting: Family Medicine

## 2019-05-13 ENCOUNTER — Telehealth: Payer: Self-pay

## 2019-05-13 DIAGNOSIS — Z889 Allergy status to unspecified drugs, medicaments and biological substances status: Secondary | ICD-10-CM

## 2019-05-13 MED FILL — LEVOCETIRIZINE 5 MG TABLET: 5 | 30 days supply | Qty: 30 | Fill #3

## 2019-05-13 NOTE — Telephone Encounter (Signed)
Left message for patient to remind of missed remote transmission. LL 

## 2019-05-25 ENCOUNTER — Telehealth: Payer: Self-pay

## 2019-05-25 NOTE — Telephone Encounter (Signed)
LVM requesting patient call back to schedule nurse visit for Prolia injection-patient owes $0 at check-in and is ready to be scheduled

## 2019-05-26 ENCOUNTER — Other Ambulatory Visit: Payer: Self-pay

## 2019-06-01 ENCOUNTER — Other Ambulatory Visit: Payer: Self-pay

## 2019-06-01 ENCOUNTER — Ambulatory Visit (INDEPENDENT_AMBULATORY_CARE_PROVIDER_SITE_OTHER): Payer: Medicare Other | Admitting: Nutrition

## 2019-06-01 DIAGNOSIS — M810 Age-related osteoporosis without current pathological fracture: Secondary | ICD-10-CM | POA: Diagnosis not present

## 2019-06-01 NOTE — Progress Notes (Signed)
Remote pacemaker transmission.   

## 2019-06-01 NOTE — Progress Notes (Signed)
Per orders of Dr Cruzita Lederer  injection of Prolia 60mg  given today by L.Spagnol,RN. Patient tolerated injection well.

## 2019-06-06 ENCOUNTER — Other Ambulatory Visit: Payer: Self-pay | Admitting: Family Medicine

## 2019-06-11 ENCOUNTER — Other Ambulatory Visit: Payer: Self-pay | Admitting: Family Medicine

## 2019-06-11 ENCOUNTER — Other Ambulatory Visit: Payer: Self-pay

## 2019-06-11 MED ORDER — LEVOTHYROXINE SODIUM 100 MCG PO TABS: ORAL_TABLET | ORAL | 0 refills | Status: DC

## 2019-06-11 NOTE — Telephone Encounter (Signed)
Patient called in asking for current pending medication to be sent to  [facility]  [place] [address]  Phone: [phone] Fax: [phone]

## 2019-06-15 ENCOUNTER — Other Ambulatory Visit: Payer: Self-pay | Admitting: *Deleted

## 2019-06-15 NOTE — Telephone Encounter (Addendum)
Eliquis 5mg  refill request received, pt is 78 yrs old, weight-68.5kg, Crea-1.02 on 02/15/2019, Diagnosis-Afib, and last seen by [facility] on 09/30/17-therefore, pt needs an appt. Dose is appropriate based on dosing criteria. Will call the pt to remind her that she needs to schedule an appt with Cardiologist. Called pt and notified her that she needs an appt and she stated that she was unaware she missed an appt and was very apologetic for not seeing the MD in a year from her last appt. Advised that an appt had not been made and that I was unsure if she had received a letter regarding calling to schedule an appt or not but I wanted to ensure she got scheduled to see the Cardiologist and she was thankful. Also, pt has just opened her last bottle of 60 tabs she stated. She is aware that I will send in a refill and transferred to the scheduling dept. Pt has an appt in January with Dr. Lequita Halt, will send refill.

## 2019-06-16 MED ORDER — APIXABAN 5 MG PO TABS: 5.0000 mg | ORAL_TABLET | Freq: Two times a day (BID) | ORAL | 1 refills | Status: DC

## 2019-06-21 ENCOUNTER — Other Ambulatory Visit: Payer: Self-pay | Admitting: Family Medicine

## 2019-06-22 MED FILL — LEVOCETIRIZINE 5 MG TABLET: 5 | 30 days supply | Qty: 30 | Fill #0

## 2019-07-12 ENCOUNTER — Encounter: Payer: Self-pay | Admitting: Family Medicine

## 2019-07-12 ENCOUNTER — Ambulatory Visit (INDEPENDENT_AMBULATORY_CARE_PROVIDER_SITE_OTHER): Payer: Medicare Other | Admitting: Family Medicine

## 2019-07-12 ENCOUNTER — Other Ambulatory Visit: Payer: Self-pay

## 2019-07-12 VITALS — BP 130/78 | HR 78 | Temp 97.2°F | Resp 18 | Ht 63.0 in | Wt 145.0 lb

## 2019-07-12 DIAGNOSIS — I10 Essential (primary) hypertension: Secondary | ICD-10-CM | POA: Diagnosis not present

## 2019-07-12 DIAGNOSIS — E890 Postprocedural hypothyroidism: Secondary | ICD-10-CM

## 2019-07-12 DIAGNOSIS — E039 Hypothyroidism, unspecified: Secondary | ICD-10-CM | POA: Diagnosis not present

## 2019-07-12 LAB — LIPID PANEL
Cholesterol: 175 mg/dL (ref 0–200)
HDL: 49.9 mg/dL (ref >39.00)
LDL Cholesterol: 106 mg/dL — ABNORMAL HIGH (ref 0–99)
NonHDL: 125.37
Total CHOL/HDL Ratio: 4
Triglycerides: 98 mg/dL (ref 0.0–149.0)
VLDL: 19.6 mg/dL (ref 0.0–40.0)

## 2019-07-12 LAB — COMPREHENSIVE METABOLIC PANEL
ALT: 15 U/L (ref 0–35)
AST: 24 U/L (ref 0–37)
Albumin: 4.5 g/dL (ref 3.5–5.2)
Alkaline Phosphatase: 48 U/L (ref 39–117)
BUN: 23 mg/dL (ref 6–23)
CO2: 26 mEq/L (ref 19–32)
Calcium: 10.3 mg/dL (ref 8.4–10.5)
Chloride: 96 mEq/L (ref 96–112)
Creatinine, Ser: 1.01 mg/dL (ref 0.40–1.20)
GFR: 63.66 mL/min (ref >60.00)
Glucose, Bld: 81 mg/dL (ref 70–99)
Potassium: 3.8 mEq/L (ref 3.5–5.1)
Sodium: 132 mEq/L — ABNORMAL LOW (ref 135–145)
Total Bilirubin: 0.6 mg/dL (ref 0.2–1.2)
Total Protein: 7.1 g/dL (ref 6.0–8.3)

## 2019-07-12 LAB — TSH: TSH: 3.04 u[IU]/mL (ref 0.35–4.50)

## 2019-07-12 MED ORDER — AMLODIPINE BESYLATE 5 MG PO TABS: 5.0000 mg | ORAL_TABLET | Freq: Every day | ORAL | 1 refills | Status: DC

## 2019-07-12 MED ORDER — LEVOTHYROXINE SODIUM 100 MCG PO TABS: ORAL_TABLET | ORAL | 2 refills | Status: DC

## 2019-07-12 MED ORDER — DICLOFENAC SODIUM 1 % EX GEL: 4.0000 g | Freq: Four times a day (QID) | CUTANEOUS | 2 refills | Status: DC

## 2019-07-12 MED ORDER — LISINOPRIL 40 MG PO TABS: 40.0000 mg | ORAL_TABLET | Freq: Every day | ORAL | 1 refills | Status: DC

## 2019-07-12 MED ORDER — HYDROCHLOROTHIAZIDE 12.5 MG PO TABS: 12.5000 mg | ORAL_TABLET | Freq: Every day | ORAL | 1 refills | Status: DC

## 2019-07-12 MED FILL — LISINOPRIL 40 MG TABLET: 40 | 90 days supply | Qty: 90 | Fill #0

## 2019-07-12 MED FILL — DICLOFENAC SODIUM 1 % GEL: 1 | 6 days supply | Qty: 100 | Fill #0

## 2019-07-12 MED FILL — HYDROCHLOROTHIAZIDE 12.5 MG: 12.5 | 90 days supply | Qty: 90 | Fill #0

## 2019-07-12 MED FILL — AMLODIPINE BESYLATE 5 MG TA: 5 | 90 days supply | Qty: 90 | Fill #0

## 2019-07-12 NOTE — Patient Instructions (Signed)
DASH Eating Plan DASH stands for "Dietary Approaches to Stop Hypertension." The DASH eating plan is a healthy eating plan that has been shown to reduce high blood pressure (hypertension). It may also reduce your risk for type 2 diabetes, heart disease, and stroke. The DASH eating plan may also help with weight loss. What are tips for following this plan?  General guidelines  Avoid eating more than 2,300 mg (milligrams) of salt (sodium) a day. If you have hypertension, you may need to reduce your sodium intake to 1,500 mg a day.  Limit alcohol intake to no more than 1 drink a day for nonpregnant women and 2 drinks a day for men. One drink equals 12 oz of beer, 5 oz of wine, or 1 oz of hard liquor.  Work with your health care provider to maintain a healthy body weight or to lose weight. Ask what an ideal weight is for you.  Get at least 30 minutes of exercise that causes your heart to beat faster (aerobic exercise) most days of the week. Activities may include walking, swimming, or biking.  Work with your health care provider or diet and nutrition specialist (dietitian) to adjust your eating plan to your individual calorie needs. Reading food labels   Check food labels for the amount of sodium per serving. Choose foods with less than 5 percent of the Daily Value of sodium. Generally, foods with less than 300 mg of sodium per serving fit into this eating plan.  To find whole grains, look for the word "whole" as the first word in the ingredient list. Shopping  Buy products labeled as "low-sodium" or "no salt added."  Buy fresh foods. Avoid canned foods and premade or frozen meals. Cooking  Avoid adding salt when cooking. Use salt-free seasonings or herbs instead of table salt or sea salt. Check with your health care provider or pharmacist before using salt substitutes.  Do not fry foods. Cook foods using healthy methods such as baking, boiling, grilling, and broiling instead.  Cook with  heart-healthy oils, such as olive, canola, soybean, or sunflower oil. Meal planning  Eat a balanced diet that includes: ? 5 or more servings of fruits and vegetables each day. At each meal, try to fill half of your plate with fruits and vegetables. ? Up to 6-8 servings of whole grains each day. ? Less than 6 oz of lean meat, poultry, or fish each day. A 3-oz serving of meat is about the same size as a deck of cards. One egg equals 1 oz. ? 2 servings of low-fat dairy each day. ? A serving of nuts, seeds, or beans 5 times each week. ? Heart-healthy fats. Healthy fats called Omega-3 fatty acids are found in foods such as flaxseeds and coldwater fish, like sardines, salmon, and mackerel.  Limit how much you eat of the following: ? Canned or prepackaged foods. ? Food that is high in trans fat, such as fried foods. ? Food that is high in saturated fat, such as fatty meat. ? Sweets, desserts, sugary drinks, and other foods with added sugar. ? Full-fat dairy products.  Do not salt foods before eating.  Try to eat at least 2 vegetarian meals each week.  Eat more home-cooked food and less restaurant, buffet, and fast food.  When eating at a restaurant, ask that your food be prepared with less salt or no salt, if possible. What foods are recommended? The items listed may not be a complete list. Talk with your dietitian about   what dietary choices are best for you. Grains Whole-grain or whole-wheat bread. Whole-grain or whole-wheat pasta. Brown rice. Oatmeal. Quinoa. Bulgur. Whole-grain and low-sodium cereals. Pita bread. Low-fat, low-sodium crackers. Whole-wheat flour tortillas. Vegetables Fresh or frozen vegetables (raw, steamed, roasted, or grilled). Low-sodium or reduced-sodium tomato and vegetable juice. Low-sodium or reduced-sodium tomato sauce and tomato paste. Low-sodium or reduced-sodium canned vegetables. Fruits All fresh, dried, or frozen fruit. Canned fruit in natural juice (without  added sugar). Meat and other protein foods Skinless chicken or turkey. Ground chicken or turkey. Pork with fat trimmed off. Fish and seafood. Egg whites. Dried beans, peas, or lentils. Unsalted nuts, nut butters, and seeds. Unsalted canned beans. Lean cuts of beef with fat trimmed off. Low-sodium, lean deli meat. Dairy Low-fat (1%) or fat-free (skim) milk. Fat-free, low-fat, or reduced-fat cheeses. Nonfat, low-sodium ricotta or cottage cheese. Low-fat or nonfat yogurt. Low-fat, low-sodium cheese. Fats and oils Soft margarine without trans fats. Vegetable oil. Low-fat, reduced-fat, or light mayonnaise and salad dressings (reduced-sodium). Canola, safflower, olive, soybean, and sunflower oils. Avocado. Seasoning and other foods Herbs. Spices. Seasoning mixes without salt. Unsalted popcorn and pretzels. Fat-free sweets. What foods are not recommended? The items listed may not be a complete list. Talk with your dietitian about what dietary choices are best for you. Grains Baked goods made with fat, such as croissants, muffins, or some breads. Dry pasta or rice meal packs. Vegetables Creamed or fried vegetables. Vegetables in a cheese sauce. Regular canned vegetables (not low-sodium or reduced-sodium). Regular canned tomato sauce and paste (not low-sodium or reduced-sodium). Regular tomato and vegetable juice (not low-sodium or reduced-sodium). Pickles. Olives. Fruits Canned fruit in a light or heavy syrup. Fried fruit. Fruit in cream or butter sauce. Meat and other protein foods Fatty cuts of meat. Ribs. Fried meat. Bacon. Sausage. Bologna and other processed lunch meats. Salami. Fatback. Hotdogs. Bratwurst. Salted nuts and seeds. Canned beans with added salt. Canned or smoked fish. Whole eggs or egg yolks. Chicken or turkey with skin. Dairy Whole or 2% milk, cream, and half-and-half. Whole or full-fat cream cheese. Whole-fat or sweetened yogurt. Full-fat cheese. Nondairy creamers. Whipped toppings.  Processed cheese and cheese spreads. Fats and oils Butter. Stick margarine. Lard. Shortening. Ghee. Bacon fat. Tropical oils, such as coconut, palm kernel, or palm oil. Seasoning and other foods Salted popcorn and pretzels. Onion salt, garlic salt, seasoned salt, table salt, and sea salt. Worcestershire sauce. Tartar sauce. Barbecue sauce. Teriyaki sauce. Soy sauce, including reduced-sodium. Steak sauce. Canned and packaged gravies. Fish sauce. Oyster sauce. Cocktail sauce. Horseradish that you find on the shelf. Ketchup. Mustard. Meat flavorings and tenderizers. Bouillon cubes. Hot sauce and Tabasco sauce. Premade or packaged marinades. Premade or packaged taco seasonings. Relishes. Regular salad dressings. Where to find more information:  National Heart, Lung, and Blood Institute: www.nhlbi.nih.gov  American Heart Association: www.heart.org Summary  The DASH eating plan is a healthy eating plan that has been shown to reduce high blood pressure (hypertension). It may also reduce your risk for type 2 diabetes, heart disease, and stroke.  With the DASH eating plan, you should limit salt (sodium) intake to 2,300 mg a day. If you have hypertension, you may need to reduce your sodium intake to 1,500 mg a day.  When on the DASH eating plan, aim to eat more fresh fruits and vegetables, whole grains, lean proteins, low-fat dairy, and heart-healthy fats.  Work with your health care provider or diet and nutrition specialist (dietitian) to adjust your eating plan to your   individual calorie needs. This information is not intended to replace advice given to you by your health care provider. Make sure you discuss any questions you have with your health care provider. Document Revised: 05/30/2017 Document Reviewed: 06/10/2016 Elsevier Patient Education  2020 [employer]  

## 2019-07-12 NOTE — Progress Notes (Signed)
Patient ID: [name], female    DOB: Nov 26, 1940  Age: 79 y.o. MRN: [id number]    Subjective:  Subjective  HPI NICAELA TREESH presents for f/u bp and thyroid.  Pt has no complaints   Review of Systems  Constitutional: Negative for appetite change, diaphoresis, fatigue and unexpected weight change.  Eyes: Negative for pain, redness and visual disturbance.  Respiratory: Negative for cough, chest tightness, shortness of breath and wheezing.   Cardiovascular: Negative for chest pain, palpitations and leg swelling.  Endocrine: Negative for cold intolerance, heat intolerance, polydipsia, polyphagia and polyuria.  Genitourinary: Negative for difficulty urinating, dysuria and frequency.  Neurological: Negative for dizziness, light-headedness, numbness and headaches.    History Past Medical History:  Diagnosis Date  . Atrioventricular block, complete ([place])    a. 08/2011 Upgrage of PPM to MDT Adapta L Dual Chamber PPM ser # [id number].  . Cardiac pacemaker in situ   . Contact dermatitis and eczema    unspec cause  . Dairy product intolerance   . Depression   . Diastolic dysfunction   . GERD (gastroesophageal reflux disease)   . Hiatal hernia   . History of CHF (congestive heart failure)   . History of diverticulitis of colon    2003  perforated diverticulitis w/ surgical intervention  . History of kidney stones    age 32  . History of peripheral edema    lower extremities  . History of thyroid nodule    multinodular goiter  s/p  total thyroidectomy 2014  . HTN (hypertension)   . Hydronephrosis, left   . Hypothyroidism   . Mild sleep apnea    per study 04-14-2014  . Nocturia   . Osteoporosis   . PAF (paroxysmal atrial fibrillation) ([place])   . PONV (postoperative nausea and vomiting)   . Pulmonary nodules    stable per last ct  . Renal cell carcinoma of left kidney (HCC)   . Shortness of breath    intermittant  . Superficial thrombophlebitis     She has a past surgical  history that includes Thyroidectomy (N/A, 08/07/2012); Colonoscopy (01/19/2013); pacemaker generator change (Left, 09/16/2011); pacemaker placement (05-11-2003  ); EXPLORATORY LAPARTOMY SIGMOID COLECTOMY/ COLOSTOMY (04-28-2002); Colostomy takedown (07-22-2002); LAPAROSCOPY VENTRAL HERNIA REPAIR/  EXTENSIVE LYSIS ADHESIONS (05-04-2004); Cardiovascular stress test (04-20-2015  dr Caryl Comes); transthoracic echocardiogram (10-12-2014); Cystoscopy with retrograde pyelogram, ureteroscopy and stent placement (Left, 07/28/2015); Robot assisted laparoscopic nephrectomy (Left, 03/20/2016); and Kyphoplasty.   Her family history includes Cancer in her son; Diverticulosis in her sister; Heart disease in her brother and mother; Heart failure in her father; Hypertension in her brother, brother, and mother; Stroke in her mother.She reports that she quit smoking about 39 years ago. Her smoking use included cigarettes. She quit after 15.00 years of use. She has never used smokeless tobacco. She reports that she does not drink alcohol or use drugs.  Current Outpatient Medications on File Prior to Visit  Medication Sig Dispense Refill  . acetaminophen (TYLENOL) 500 MG tablet Take 1,000 mg by mouth daily as needed for moderate pain.    [name] apixaban (ELIQUIS) 5 MG TABS tablet Take 1 tablet (5 mg total) by mouth 2 (two) times daily. 180 tablet 1  . denosumab (PROLIA) 60 MG/ML SOLN Inject 60 mg into the skin every 6 (six) months. Reported on 09/07/2015    . diclofenac sodium (VOLTAREN) 1 % GEL Apply 2 g topically 4 (four) times daily. 100 g 1  . diphenhydramine-acetaminophen (TYLENOL PM) 25-500 MG TABS  tablet Take 2 tablets by mouth at bedtime as needed (sleep/pain).    . fluticasone (FLONASE) 50 MCG/ACT nasal spray Place 2 sprays into both nostrils daily as needed for allergies or rhinitis. 16 g 2  . levocetirizine (XYZAL) 5 MG tablet TAKE 1 TABLET (5 MG TOTAL) BY MOUTH EVERY EVENING. 30 tablet 3  . loratadine (CLARITIN) 10 MG tablet TAKE  1 TABLET (10 MG TOTAL) BY MOUTH DAILY AS NEEDED FOR ALLERGIES. 100 tablet 2   Current Facility-Administered Medications on File Prior to Visit  Medication Dose Route Frequency Provider Last Rate Last Admin  . aminophylline injection 150 mg  150 mg Intravenous BID PRN Dorothy Spark, MD   150 mg at 04/20/15 1200     Objective:  Objective  Physical Exam Vitals and nursing note reviewed.  Constitutional:      Appearance: She is well-developed.  HENT:     Head: Normocephalic and atraumatic.  Eyes:     Conjunctiva/sclera: Conjunctivae normal.  Neck:     Thyroid: No thyromegaly.     Vascular: No carotid bruit or JVD.  Cardiovascular:     Rate and Rhythm: Normal rate and regular rhythm.     Heart sounds: Normal heart sounds. No murmur.  Pulmonary:     Effort: Pulmonary effort is normal. No respiratory distress.     Breath sounds: Normal breath sounds. No wheezing or rales.  Chest:     Chest wall: No tenderness.  Musculoskeletal:     Cervical back: Normal range of motion and neck supple.  Neurological:     Mental Status: She is alert and oriented to person, place, and time.    BP 130/78 (BP Location: Right Arm, Patient Position: Sitting, Cuff Size: Normal)   Pulse 78   Temp (!) 97.2 F (36.2 C) (Temporal)   Resp 18   Ht 5\' 3"  (1.6 m)   Wt 145 lb (65.8 kg)   SpO2 99%   BMI 25.69 kg/m  Wt Readings from Last 3 Encounters:  07/12/19 145 lb (65.8 kg)  02/15/19 151 lb (68.5 kg)  11/03/18 148 lb 6.4 oz (67.3 kg)     Lab Results  Component Value Date   WBC 5.4 03/05/2018   HGB 13.0 03/05/2018   HCT 38.6 03/05/2018   PLT 166.0 03/05/2018   GLUCOSE 81 07/12/2019   CHOL 175 07/12/2019   TRIG 98.0 07/12/2019   HDL 49.90 07/12/2019   LDLCALC 106 (H) 07/12/2019   ALT 15 07/12/2019   AST 24 07/12/2019   NA 132 (L) 07/12/2019   K 3.8 07/12/2019   CL 96 07/12/2019   CREATININE 1.01 07/12/2019   BUN 23 07/12/2019   CO2 26 07/12/2019   TSH 3.04 07/12/2019   INR 1.01  11/04/2013   HGBA1C 5.5 06/07/2016    DG Lumbar Spine 2-3 Views  Result Date: 04/09/2018 CLINICAL DATA:  Chronic low back pain. History of kyphoplasties. Left hip pain for 2 months. No injury. EXAM: LUMBAR SPINE - 2-3 VIEW COMPARISON:  CT, CT, 07/12/2015 FINDINGS: No acute fracture. Multiple prior fractures have been stabilized with vertebroplasty. Vertebroplasty cement is seen at T9, T11, T12, L2, L4 and L5. Slight anterolisthesis of L5 on S1.  No other spondylolisthesis. Mild loss of disc height at L3-L4 and L4-L5 with moderate loss of disc height at L5-S1. Bones are diffusely demineralized. IMPRESSION: 1. No acute fracture or acute finding.  No bone lesion. 2. Multiple old fractures stabilized with vertebroplasty. 3. Mild disc degenerative changes. Slight anterolisthesis  of L5 on S1. These findings are stable from the prior CT. Electronically Signed   By: Lajean Manes M.D.   On: 04/09/2018 15:42   DG HIP UNILAT WITH PELVIS 1V LEFT  Result Date: 04/09/2018 CLINICAL DATA:  Chronic low back pain. Spinal augmentation at multiple levels. Left hip pain. EXAM: DG HIP (WITH OR WITHOUT PELVIS) 1V*L* COMPARISON:  None. FINDINGS: Left hip is located. Acute or healing fractures are present. Mild degenerative changes are noted at the greater trochanter. Degenerative changes are also noted at the SI joints bilaterally, left greater than right. 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 IMPRESSION: 1. 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 2. No acute abnormality. Electronically Signed   By: San Morelle M.D.   On: 04/09/2018 15:40     Assessment & Plan:  Plan  I have discontinued Zura Lancto. Muff's predniSONE, predniSONE, tiZANidine, and erythromycin. I have also changed her amLODipine. Additionally, I am having her start on diclofenac Sodium. Lastly, I am having her maintain her denosumab, diphenhydramine-acetaminophen, acetaminophen, diclofenac sodium, fluticasone, loratadine, apixaban,  levocetirizine, levothyroxine, hydrochlorothiazide, and lisinopril.  Meds ordered this encounter  Medications  . levothyroxine (SYNTHROID) 100 MCG tablet    Sig: TAKE 1 TABLET BY MOUTH ONCE DAILY BEFORE BREAKFAST    Dispense:  30 tablet    Refill:  2    Needs office visit  . amLODipine (NORVASC) 5 MG tablet    Sig: Take 1 tablet (5 mg total) by mouth daily.    Dispense:  90 tablet    Refill:  1  . hydrochlorothiazide (HYDRODIURIL) 12.5 MG tablet    Sig: Take 1 tablet (12.5 mg total) by mouth daily.    Dispense:  90 tablet    Refill:  1  . lisinopril (ZESTRIL) 40 MG tablet    Sig: Take 1 tablet (40 mg total) by mouth daily.    Dispense:  90 tablet    Refill:  1  . diclofenac Sodium (VOLTAREN) 1 % GEL    Sig: Apply 4 g topically 4 (four) times daily.    Dispense:  100 g    Refill:  2    Problem List Items Addressed This Visit      Unprioritized   Essential hypertension - Primary    Well controlled, no changes to meds. Encouraged heart healthy diet such as the DASH diet and exercise as tolerated.       Relevant Medications   amLODipine (NORVASC) 5 MG tablet   hydrochlorothiazide (HYDRODIURIL) 12.5 MG tablet   lisinopril (ZESTRIL) 40 MG tablet   Other Relevant Orders   Lipid panel (Completed)   Comprehensive metabolic panel (Completed)   Hypothyroidism, postsurgical    Check labs  con't synthroid       Relevant Medications   levothyroxine (SYNTHROID) 100 MCG tablet    Other Visit Diagnoses    Hypothyroidism, unspecified type       Relevant Medications   levothyroxine (SYNTHROID) 100 MCG tablet   Other Relevant Orders   TSH (Completed)      Follow-up: Return if symptoms worsen or fail to improve.  [name], DO

## 2019-07-12 NOTE — Assessment & Plan Note (Signed)
Check labs con't synthroid 

## 2019-07-12 NOTE — Assessment & Plan Note (Signed)
Well controlled, no changes to meds. Encouraged heart healthy diet such as the DASH diet and exercise as tolerated.  °

## 2019-07-16 ENCOUNTER — Telehealth: Payer: Self-pay | Admitting: Internal Medicine

## 2019-07-16 NOTE — Telephone Encounter (Signed)
Patient states that she got a call from [place] stating that they have not received the patient assistant form for the patients apixaban (ELIQUIS) 5 MG TABS tablet. She stated that the lady she spoke with told her that if the letter was faxed and they never received it then the date can just be changed and faxed again. The fax number is [phone].

## 2019-07-16 NOTE — Telephone Encounter (Signed)
**Note De-Identified [name] Obfuscation** The pt states that she faxed her part of a [employer] pt asst application to [employer] for asst with her Eliquis. She states that they contacted her and asked her to contact us concerning the provider part of the application. I advised her that we will take care of this today.

## 2019-07-22 DIAGNOSIS — I480 Paroxysmal atrial fibrillation: Secondary | ICD-10-CM | POA: Insufficient documentation

## 2019-07-22 DIAGNOSIS — I472 Ventricular tachycardia, unspecified: Secondary | ICD-10-CM | POA: Insufficient documentation

## 2019-07-22 DIAGNOSIS — I4891 Unspecified atrial fibrillation: Secondary | ICD-10-CM | POA: Insufficient documentation

## 2019-07-23 ENCOUNTER — Encounter: Payer: Self-pay | Admitting: Internal Medicine

## 2019-07-23 ENCOUNTER — Ambulatory Visit (INDEPENDENT_AMBULATORY_CARE_PROVIDER_SITE_OTHER): Payer: Medicare Other | Admitting: Internal Medicine

## 2019-07-23 ENCOUNTER — Other Ambulatory Visit: Payer: Self-pay

## 2019-07-23 VITALS — BP 140/84 | HR 91 | Ht 63.0 in | Wt 146.0 lb

## 2019-07-23 DIAGNOSIS — I472 Ventricular tachycardia, unspecified: Secondary | ICD-10-CM

## 2019-07-23 DIAGNOSIS — I10 Essential (primary) hypertension: Secondary | ICD-10-CM

## 2019-07-23 DIAGNOSIS — I480 Paroxysmal atrial fibrillation: Secondary | ICD-10-CM

## 2019-07-23 DIAGNOSIS — I442 Atrioventricular block, complete: Secondary | ICD-10-CM | POA: Diagnosis not present

## 2019-07-23 DIAGNOSIS — R7303 Prediabetes: Secondary | ICD-10-CM | POA: Diagnosis not present

## 2019-07-23 DIAGNOSIS — I4891 Unspecified atrial fibrillation: Secondary | ICD-10-CM

## 2019-07-23 NOTE — Patient Instructions (Addendum)
Medication Instructions:  Your physician recommends that you continue on your current medications as directed. Please refer to the Current Medication list given to you today.  Labwork: Your physician recommends that you return for lab work today CBC   Testing/Procedures: None ordered.  Follow-Up: Your physician wants you to follow-up in: one year. You will receive a reminder letter in the mail two months in advance. If you don't receive a letter, please call our office to schedule the follow-up appointment.  Remote monitoring is used to monitor your Pacemaker of ICD from home. This monitoring reduces the number of office visits required to check your device to one time per year. It allows [place] to keep an eye on the functioning of your device to ensure it is working properly.   Any Other Special Instructions Will Be Listed Below (If Applicable).  If you need a refill on your cardiac medications before your next appointment, please call your pharmacy.

## 2019-07-23 NOTE — Telephone Encounter (Signed)
**Note De-Identified [name] Obfuscation** Letter received from [employer] stating that they have denied pt asst to the pt for her Eliquis. Reason: The pts household income is over current eligibility criteria.  The letter states they have notified the pt of this denial as well.

## 2019-07-23 NOTE — Progress Notes (Signed)
So I think to receive 5 people in sync to see  Patient Care Team: Carollee Herter, Alferd Apa, DO as PCP - General (Family Medicine) Patsey Berthold, NP as Nurse Practitioner (Cardiology) Alexis Frock, MD as Consulting Physician (Urology) Deboraha Sprang, MD as Consulting Physician (Cardiology) Ronald Lobo, MD as Consulting Physician (Gastroenterology)   HPI  [name] is a 79 y.o. female Seen in followup for a pacemaker implanted for high-grade heart block which reached ERI 3/13 at that time underwent generator replacement.  Also found to have atrial fibrillation which has been cardioverted  Because of some dypsnea she underwent echo >> 4/16 normal LV function 10/16 Myoview normal LV function and no ischemia  Date Cr K Hgb  1/21 1.01 3.8             Past Medical History:  Diagnosis Date  . Atrioventricular block, complete ([place])    a. 08/2011 Upgrage of PPM to MDT Adapta L Dual Chamber PPM ser # [id number].  . Cardiac pacemaker in situ   . Contact dermatitis and eczema    unspec cause  . Dairy product intolerance   . Depression   . Diastolic dysfunction   . GERD (gastroesophageal reflux disease)   . Hiatal hernia   . History of CHF (congestive heart failure)   . History of diverticulitis of colon    2003  perforated diverticulitis w/ surgical intervention  . History of kidney stones    age 70  . History of peripheral edema    lower extremities  . History of thyroid nodule    multinodular goiter  s/p  total thyroidectomy 2014  . HTN (hypertension)   . Hydronephrosis, left   . Hypothyroidism   . Mild sleep apnea    per study 04-14-2014  . Nocturia   . Osteoporosis   . PAF (paroxysmal atrial fibrillation) ([place])   . PONV (postoperative nausea and vomiting)   . Pulmonary nodules    stable per last ct  . Renal cell carcinoma of left kidney (HCC)   . Shortness of breath    intermittant  . Superficial thrombophlebitis     Past Surgical History:  Procedure  Laterality Date  . CARDIOVASCULAR STRESS TEST  04-20-2015  dr Caryl Comes   normal nuclear study/  no ischemia/  normal LV function and wall motion, ef 74%  . COLONOSCOPY  01/19/2013  . COLOSTOMY TAKEDOWN  07-22-2002  . CYSTOSCOPY WITH RETROGRADE PYELOGRAM, URETEROSCOPY AND STENT PLACEMENT Left 07/28/2015   Procedure: CYSTOSCOPY WITH LEFT RETROGRADE PYELOGRAM, POSSIBLE LEFT URETEROSCOPY AND STENT PLACEMENT;  Surgeon: Franchot Gallo, MD;  Location: [facility];  Service: Urology;  Laterality: Left;  . EXPLORATORY LAPARTOMY SIGMOID COLECTOMY/ COLOSTOMY  04-28-2002   perforated diverticulitis  . KYPHOPLASTY    . LAPAROSCOPY VENTRAL HERNIA REPAIR/  EXTENSIVE LYSIS ADHESIONS  05-04-2004  . PACEMAKER GENERATOR CHANGE Left 09/16/2011   MDT [place] pacemaker  . PACEMAKER PLACEMENT  05-11-2003     medtronic  . ROBOT ASSISTED LAPAROSCOPIC NEPHRECTOMY Left 03/20/2016   Procedure: XI ROBOTIC ASSISTED LAPAROSCOPIC RETROPERITONEAL NEPHRECTOMY;  Surgeon: Alexis Frock, MD;  Location: [facility];  Service: Urology;  Laterality: Left;  . THYROIDECTOMY N/A 08/07/2012   Procedure: THYROIDECTOMY;  Surgeon: Earnstine Regal, MD;  Location: [facility];  Service: General;  Laterality: N/A;  . TRANSTHORACIC ECHOCARDIOGRAM  10-12-2014   moderate focal basal LVH,  ef 55-60%,  grade 2 diastolic dysfunction, mild AV calcification without stenosis,  mild MR,  trivial PR  Current Outpatient Medications  Medication Sig Dispense Refill  . acetaminophen (TYLENOL) 500 MG tablet Take 1,000 mg by mouth daily as needed for moderate pain.    [name] amLODipine (NORVASC) 5 MG tablet Take 1 tablet (5 mg total) by mouth daily. 90 tablet 1  . apixaban (ELIQUIS) 5 MG TABS tablet Take 1 tablet (5 mg total) by mouth 2 (two) times daily. 180 tablet 1  . Chlorphen-Phenyleph-APAP (CORICIDIN D COLD/FLU/SINUS) 2-5-325 MG TABS Take 2 tablets by mouth daily.    [name] denosumab (PROLIA) 60 MG/ML SOLN Inject 60 mg into the skin every 6 (six) months.  Reported on 09/07/2015    . diclofenac Sodium (VOLTAREN) 1 % GEL Apply 4 g topically 4 (four) times daily. 100 g 2  . diphenhydramine-acetaminophen (TYLENOL PM) 25-500 MG TABS tablet Take 2 tablets by mouth at bedtime as needed (sleep/pain).    . fluticasone (FLONASE) 50 MCG/ACT nasal spray Place 2 sprays into both nostrils daily as needed for allergies or rhinitis. 16 g 2  . hydrochlorothiazide (HYDRODIURIL) 12.5 MG tablet Take 1 tablet (12.5 mg total) by mouth daily. 90 tablet 1  . levocetirizine (XYZAL) 5 MG tablet TAKE 1 TABLET (5 MG TOTAL) BY MOUTH EVERY EVENING. 30 tablet 3  . levothyroxine (SYNTHROID) 100 MCG tablet TAKE 1 TABLET BY MOUTH ONCE DAILY BEFORE BREAKFAST 30 tablet 2  . lisinopril (ZESTRIL) 40 MG tablet Take 1 tablet (40 mg total) by mouth daily. 90 tablet 1  . loratadine (CLARITIN) 10 MG tablet TAKE 1 TABLET (10 MG TOTAL) BY MOUTH DAILY AS NEEDED FOR ALLERGIES. 100 tablet 2   No current facility-administered medications for this visit.   Facility-Administered Medications Ordered in Other Visits  Medication Dose Route Frequency Provider Last Rate Last Admin  . aminophylline injection 150 mg  150 mg Intravenous BID PRN Dorothy Spark, MD   150 mg at 04/20/15 1200    Allergies  Allergen Reactions  . Tape Rash  . Codeine Nausea And Vomiting    Review of Systems negative except from HPI and PMH  Physical Exam BP 140/84   Pulse 91   Ht 5\' 3"  (1.6 m)   Wt 146 lb (66.2 kg)   SpO2 97%   BMI 25.86 kg/m  Well developed and well nourished in no acute distress HENT normal Neck supple with JVP-flat Clear Device pocket well healed; without hematoma or erythema.  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  The information was reviewed. No changes were made in the programming.     Atrial fibrillation   Interval brief atrial fibrillation.  Longest episode at this point is 41 minutes.  Nonsustained ventricular tachycardia  Blood pressure well controlled at home.    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

## 2019-07-24 LAB — CBC
Hematocrit: 40.8 % (ref 34.0–46.6)
Hemoglobin: 13.5 g/dL (ref 11.1–15.9)
MCH: 28.2 pg (ref 26.6–33.0)
MCHC: 33.1 g/dL (ref 31.5–35.7)
MCV: 85 fL (ref 79–97)
Platelets: 178 10*3/uL (ref 150–450)
RBC: 4.78 x10E6/uL (ref 3.77–5.28)
RDW: 12.3 % (ref 11.7–15.4)
WBC: 6.2 10*3/uL (ref 3.4–10.8)

## 2019-07-28 NOTE — Addendum Note (Signed)
Addended by: Rose Phi on: 07/28/2019 05:12 PM   Modules accepted: Orders

## 2019-07-29 NOTE — Telephone Encounter (Signed)
**Note De-Identified [name] Obfuscation** Letter received from [employer] pt asst foundation stating that they have approved the pt for asst with her Eliquis. Approval good until [id number] Application Case#: [id number]  The letter states that they have notified the pt of this approval as well.

## 2019-08-09 ENCOUNTER — Ambulatory Visit: Payer: Medicare Other | Attending: Internal Medicine

## 2019-08-09 DIAGNOSIS — Z23 Encounter for immunization: Secondary | ICD-10-CM | POA: Insufficient documentation

## 2019-08-09 NOTE — Progress Notes (Signed)
   Covid-19 Vaccination Clinic  Name:  [name]    MRN: [id number] DOB: 1941-03-01  08/09/2019  [name] was observed post Covid-19 immunization for 15 minutes without incidence. She was provided with Vaccine Information Sheet and instruction to access the V-Safe system.   [name] was instructed to call 911 with any severe reactions post vaccine: [name] Difficulty breathing  . Swelling of your face and throat  . A fast heartbeat  . A bad rash all over your body  . Dizziness and weakness    Immunizations Administered    Name Date Dose VIS Date Route   Pfizer COVID-19 Vaccine 08/09/2019  3:47 PM 0.3 mL 06/11/2019 Intramuscular   Manufacturer: [place]   Lot: [id number]   [place]: [id number]

## 2019-08-10 ENCOUNTER — Ambulatory Visit (INDEPENDENT_AMBULATORY_CARE_PROVIDER_SITE_OTHER): Payer: Medicare Other | Admitting: *Deleted

## 2019-08-10 DIAGNOSIS — I442 Atrioventricular block, complete: Secondary | ICD-10-CM

## 2019-08-11 ENCOUNTER — Other Ambulatory Visit: Payer: Self-pay | Admitting: Family Medicine

## 2019-08-11 ENCOUNTER — Telehealth: Payer: Self-pay

## 2019-08-11 DIAGNOSIS — E039 Hypothyroidism, unspecified: Secondary | ICD-10-CM

## 2019-08-11 LAB — CUP PACEART REMOTE DEVICE CHECK
Battery Impedance: 1429 Ohm
Battery Remaining Longevity: 43 mo
Battery Voltage: 2.75 V
Brady Statistic AP VP Percent: 1 %
Brady Statistic AP VS Percent: 0 %
Brady Statistic AS VP Percent: 99 %
Brady Statistic AS VS Percent: 0 %
Date Time Interrogation Session: 20210210101153
Implantable Lead Implant Date: 20041110
Implantable Lead Implant Date: 20041110
Implantable Lead Location: 753859
Implantable Lead Location: 753860
Implantable Pulse Generator Implant Date: 20130318
Lead Channel Impedance Value: 400 Ohm
Lead Channel Impedance Value: 520 Ohm
Lead Channel Pacing Threshold Amplitude: 0.375 V
Lead Channel Pacing Threshold Amplitude: 0.75 V
Lead Channel Pacing Threshold Pulse Width: 0.4 ms
Lead Channel Pacing Threshold Pulse Width: 0.4 ms
Lead Channel Setting Pacing Amplitude: 2 V
Lead Channel Setting Pacing Amplitude: 2.5 V
Lead Channel Setting Pacing Pulse Width: 0.4 ms
Lead Channel Setting Sensing Sensitivity: 4 mV

## 2019-08-11 NOTE — Progress Notes (Signed)
PPM Remote  

## 2019-08-11 NOTE — Telephone Encounter (Signed)
Spoke with patient to remind of missed remote transmission 

## 2019-08-16 MED FILL — LEVOCETIRIZINE 5 MG TABLET: 5 | 30 days supply | Qty: 30 | Fill #1

## 2019-08-16 MED FILL — SM ALLERGY RELIEF 50 MCG SP: 50 MCG | 30 days supply | Qty: 16 | Fill #2

## 2019-08-19 ENCOUNTER — Encounter: Payer: Self-pay | Admitting: Family Medicine

## 2019-08-25 ENCOUNTER — Telehealth: Payer: Self-pay | Admitting: *Deleted

## 2019-08-25 NOTE — Telephone Encounter (Signed)
No available appointments for the Covid 19 vaccine #2 on the day the patient is requesting to reschedule. Kept March 8th appointment.

## 2019-08-25 NOTE — Telephone Encounter (Signed)
Patient requesting to reschedule her 2nd Covid 19 vaccine.

## 2019-09-06 ENCOUNTER — Ambulatory Visit: Payer: Medicare Other | Attending: Internal Medicine

## 2019-09-06 DIAGNOSIS — Z23 Encounter for immunization: Secondary | ICD-10-CM

## 2019-09-06 NOTE — Progress Notes (Signed)
   Covid-19 Vaccination Clinic  Name:  [name]    MRN: [id number] DOB: 1940/08/10  09/06/2019  [name] was observed post Covid-19 immunization for 15 minutes without incident. She was provided with Vaccine Information Sheet and instruction to access the V-Safe system.   [name] was instructed to call 911 with any severe reactions post vaccine: [name] Difficulty breathing  . Swelling of face and throat  . A fast heartbeat  . A bad rash all over body  . Dizziness and weakness   Immunizations Administered    Name Date Dose VIS Date Route   Pfizer COVID-19 Vaccine 09/06/2019  9:54 AM 0.3 mL 06/11/2019 Intramuscular   Manufacturer: [place]   Lot: [id number]   [place]: [id number]

## 2019-09-21 MED FILL — LEVOCETIRIZINE 5 MG TABLET: 5 | 30 days supply | Qty: 30 | Fill #2

## 2019-09-21 MED FILL — LORATADINE 10 MG TABS: 10 | 100 days supply | Qty: 100 | Fill #0

## 2019-09-30 MED FILL — FLUTICASONE PROP 50 MCG SPR: 50 | 30 days supply | Qty: 16 | Fill #1

## 2019-09-30 MED FILL — DICLOFENAC SODIUM 1 % GEL: 1 | 6 days supply | Qty: 100 | Fill #1

## 2019-10-20 MED FILL — LISINOPRIL 40 MG TABS: 40 | 90 days supply | Qty: 90 | Fill #1

## 2019-10-26 MED FILL — HYDROCHLOROTHIAZIDE 12.5 MG: 12.5 | 90 days supply | Qty: 90 | Fill #1

## 2019-11-09 ENCOUNTER — Ambulatory Visit (INDEPENDENT_AMBULATORY_CARE_PROVIDER_SITE_OTHER): Payer: Medicare Other | Admitting: *Deleted

## 2019-11-09 DIAGNOSIS — I442 Atrioventricular block, complete: Secondary | ICD-10-CM | POA: Diagnosis not present

## 2019-11-10 LAB — CUP PACEART REMOTE DEVICE CHECK
Battery Impedance: 1569 Ohm
Battery Remaining Longevity: 39 mo
Battery Voltage: 2.75 V
Brady Statistic AP VP Percent: 2 %
Brady Statistic AP VS Percent: 0 %
Brady Statistic AS VP Percent: 98 %
Brady Statistic AS VS Percent: 0 %
Date Time Interrogation Session: 20210512132051
Implantable Lead Implant Date: 20041110
Implantable Lead Implant Date: 20041110
Implantable Lead Location: 753859
Implantable Lead Location: 753860
Implantable Pulse Generator Implant Date: 20130318
Lead Channel Impedance Value: 373 Ohm
Lead Channel Impedance Value: 511 Ohm
Lead Channel Pacing Threshold Amplitude: 0.5 V
Lead Channel Pacing Threshold Amplitude: 0.75 V
Lead Channel Pacing Threshold Pulse Width: 0.4 ms
Lead Channel Pacing Threshold Pulse Width: 0.4 ms
Lead Channel Setting Pacing Amplitude: 2 V
Lead Channel Setting Pacing Amplitude: 2.5 V
Lead Channel Setting Pacing Pulse Width: 0.4 ms
Lead Channel Setting Sensing Sensitivity: 4 mV

## 2019-11-11 NOTE — Progress Notes (Signed)
Remote pacemaker transmission.   

## 2019-11-12 ENCOUNTER — Other Ambulatory Visit: Payer: Self-pay

## 2019-11-15 ENCOUNTER — Telehealth: Payer: Self-pay

## 2019-11-15 NOTE — Telephone Encounter (Signed)
Patient has been submitted to [employer] Assist Portal to get summary of benefits. 

## 2019-11-16 ENCOUNTER — Other Ambulatory Visit: Payer: Self-pay

## 2019-11-16 ENCOUNTER — Ambulatory Visit (INDEPENDENT_AMBULATORY_CARE_PROVIDER_SITE_OTHER): Payer: Medicare Other | Admitting: Internal Medicine

## 2019-11-16 ENCOUNTER — Encounter: Payer: Self-pay | Admitting: Internal Medicine

## 2019-11-16 VITALS — BP 150/98 | HR 90 | Ht 63.0 in | Wt 143.0 lb

## 2019-11-16 DIAGNOSIS — M810 Age-related osteoporosis without current pathological fracture: Secondary | ICD-10-CM | POA: Diagnosis not present

## 2019-11-16 DIAGNOSIS — E559 Vitamin D deficiency, unspecified: Secondary | ICD-10-CM | POA: Diagnosis not present

## 2019-11-16 LAB — BASIC METABOLIC PANEL
BUN: 24 mg/dL — ABNORMAL HIGH (ref 6–23)
CO2: 31 mEq/L (ref 19–32)
Calcium: 9.9 mg/dL (ref 8.4–10.5)
Chloride: 98 mEq/L (ref 96–112)
Creatinine, Ser: 1.04 mg/dL (ref 0.40–1.20)
GFR: 61.49 mL/min (ref >60.00)
Glucose, Bld: 91 mg/dL (ref 70–99)
Potassium: 4.4 mEq/L (ref 3.5–5.1)
Sodium: 134 mEq/L — ABNORMAL LOW (ref 135–145)

## 2019-11-16 NOTE — Progress Notes (Signed)
Patient ID: [name], female   DOB: 02-22-1941, 79 y.o.   MRN: [id number]   This visit occurred during the SARS-CoV-2 public health emergency.  Safety protocols were in place, including screening questions prior to the visit, additional usage of staff PPE, and extensive cleaning of exam room while observing appropriate contact time as indicated for disinfecting solutions.   HPI  [name] is a 79 y.o.-year-old female, referred by her PCP, Dr. Etter Sjogren, presenting for follow-up for osteoporosis.  Last visit 1 year ago.  Pt was dx with OP in in 1999-2000, in [place].  Reviewed her DXA scan reports: Date L1-L4 T score FN T score 33% distal Radius Ultra distal radius  05/29/2017 [facility]) N/a (multiple compression fractures) LFN: -3.1 (+0.5%) RFN: -2.9 -3.2 (-1.0%) -4.6  05/29/2015 ([facility]) L1, L3: -2.7 LFN: -3.1 -3.1 n/a  06/04/2012 L1-L4: -3.4 LFN: -2.2 RFN: -2.2 n/a n/a   Fractures: 7 vertebral fractures with 6 kyphoplasties: Reviewed CT Lumbar spine report from 2016: Remote compression fractures with cement augmentation T11, T12, L2, L4 and L5. Tiny amount of cement seen adjacent to the right L4 and L5 nerve roots. Subacute superior endplate compression deformity L1 and L3.  Osteoporosis treatments: - Fosamax  - 2 years >> ineffective - Forteo - 2 years  - 2003 - Prolia - started 2012 (total of 7 years - however, only 1 documented inj/year in 2012, 2014, 2015 and no doses documented in 2017):  10/09/2017 05/14/2018 11/19/2018 06/01/2019  She continues to have dizziness from allergies, but denies vertigo, orthostasis, poor vision.  No falls since last visit  She has a history of vitamin D insufficiency.  Reviewed available vit D levels: Lab Results  Component Value Date   VD25OH 37.32 01/18/2019   VD25OH 29.82 (L) 10/06/2017   VD25OH 32 07/04/2009   VD25OH 24 (L) 02/14/2009   Patient is on calcium and vitamin D 500 mg - 200 units 3 times daily  supplements.  We also added 1000 units vitamin D daily but she was at last visit.  We started 2000 units daily.  She also gets 200 units daily from calcium plus D.  She does not do weightbearing exercises..   She does not take high vitamin A doses.  No steroid injections.  She had early menopause, at 78 to 79 years old.  No HRT.   Pt does have a FH of osteoporosis: Mother- fx'ed ankle, foot.  No history of kidney stones, hyper or hypocalcemia or hyperparathyroidism: Lab Results  Component Value Date   PTH 25 10/06/2017   PTH Comment 10/06/2017   CALCIUM 10.3 07/12/2019   CALCIUM 9.5 02/15/2019   CALCIUM 9.5 01/18/2019   CALCIUM 9.2 05/14/2018   CALCIUM 9.9 03/05/2018   CALCIUM 10.0 10/06/2017   CALCIUM 9.7 09/03/2017   CALCIUM 9.1 06/05/2017   CALCIUM 9.6 12/02/2016   CALCIUM 9.7 08/15/2016   No history of thyrotoxicosis. She has a h/o thyroid nodules >> now s/p total thyroidectomy, on LT4 100 mcg.  Latest TSH was normal: Lab Results  Component Value Date   TSH 3.04 07/12/2019   TSH 2.92 05/14/2018   TSH 5.40 (H) 03/05/2018   TSH 1.14 06/05/2017   TSH 1.770 08/13/2016   No history of CKD. Last BUN/Cr: Lab Results  Component Value Date   BUN 23 07/12/2019   CREATININE 1.01 07/12/2019   She also has HTN.  ROS: Constitutional: no weight gain/no weight loss, no fatigue, no subjective  hyperthermia, no subjective hypothermia Eyes: no blurry vision, no xerophthalmia ENT: no sore throat, no nodules palpated in neck, no dysphagia, no odynophagia, no hoarseness Cardiovascular: no CP/no SOB/no palpitations/no leg swelling Respiratory: no cough/no SOB/no wheezing Gastrointestinal: no N/no V/no D/no C/no acid reflux Musculoskeletal: no muscle aches/no joint aches Skin: no rashes, no hair loss Neurological: no tremors/no numbness/no tingling/no dizziness  I reviewed pt's medications, allergies, PMH, social hx, family hx, and changes were documented in the history of present  illness. Otherwise, unchanged from my initial visit note.  Past Medical History:  Diagnosis Date  . Atrioventricular block, complete ([facility])    a. 08/2011 Upgrage of PPM to MDT Adapta L Dual Chamber PPM ser # [id number].  . Cardiac pacemaker in situ   . Contact dermatitis and eczema    unspec cause  . Dairy product intolerance   . Depression   . Diastolic dysfunction   . GERD (gastroesophageal reflux disease)   . Hiatal hernia   . History of CHF (congestive heart failure)   . History of diverticulitis of colon    2003  perforated diverticulitis w/ surgical intervention  . History of kidney stones    age 62  . History of peripheral edema    lower extremities  . History of thyroid nodule    multinodular goiter  s/p  total thyroidectomy 2014  . HTN (hypertension)   . Hydronephrosis, left   . Hypothyroidism   . Mild sleep apnea    per study 04-14-2014  . Nocturia   . Osteoporosis   . PAF (paroxysmal atrial fibrillation) ([facility])   . PONV (postoperative nausea and vomiting)   . Pulmonary nodules    stable per last ct  . Renal cell carcinoma of left kidney (H[facility])   . Shortness of breath    intermittant  . Superficial thrombophlebitis    Past Surgical History:  Procedure Laterality Date  . CARDIOVASCULAR STRESS TEST  04-20-2015  dr Caryl Comes   normal nuclear study/  no ischemia/  normal LV function and wall motion, ef 74%  . COLONOSCOPY  01/19/2013  . COLOSTOMY TAKEDOWN  07-22-2002  . CYSTOSCOPY WITH RETROGRADE PYELOGRAM, URETEROSCOPY AND STENT PLACEMENT Left 07/28/2015   Procedure: CYSTOSCOPY WITH LEFT RETROGRADE PYELOGRAM, POSSIBLE LEFT URETEROSCOPY AND STENT PLACEMENT;  Surgeon: Franchot Gallo, MD;  Location: [facility];  Service: Urology;  Laterality: Left;  . EXPLORATORY LAPARTOMY SIGMOID COLECTOMY/ COLOSTOMY  04-28-2002   perforated diverticulitis  . KYPHOPLASTY    . LAPAROSCOPY VENTRAL HERNIA REPAIR/  EXTENSIVE LYSIS ADHESIONS  05-04-2004  . PACEMAKER  GENERATOR CHANGE Left 09/16/2011   MDT [place] pacemaker  . PACEMAKER PLACEMENT  05-11-2003     medtronic  . ROBOT ASSISTED LAPAROSCOPIC NEPHRECTOMY Left 03/20/2016   Procedure: XI ROBOTIC ASSISTED LAPAROSCOPIC RETROPERITONEAL NEPHRECTOMY;  Surgeon: Alexis Frock, MD;  Location: [facility];  Service: Urology;  Laterality: Left;  . THYROIDECTOMY N/A 08/07/2012   Procedure: THYROIDECTOMY;  Surgeon: Earnstine Regal, MD;  Location: [facility];  Service: General;  Laterality: N/A;  . TRANSTHORACIC ECHOCARDIOGRAM  10-12-2014   moderate focal basal LVH,  ef 55-60%,  grade 2 diastolic dysfunction, mild AV calcification without stenosis,  mild MR,  trivial PR   Social History   Socioeconomic History  . Marital status: Single    Spouse name: Not on file  . Number of children: 2  . Years of education: 38  . Highest education level: Not on file  Occupational History  . 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 Smoking status: Former Smoker    Years: 15.00    Types: Cigarettes    Quit date: 10/06/1979    Years since quitting: 40.1  . Smokeless tobacco: Never Used  Substance and Sexual Activity  . Alcohol use: No  . Drug use: No  . Sexual activity: Not on file  Other Topics Concern  . 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  She was married for 22 years, divorced, and has remained single. She has 1 grown son, 1 grown daughter.  She has 3 grandchildren, 2 of her children   live in [place].  She is retired.  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   Social Determinants of Health   Financial Resource Strain:   . Difficulty of Paying Living Expenses:   Food Insecurity:   . Worried About Charity fundraiser in the Last Year:   . Arboriculturist in  the Last Year:   Transportation Needs:   . Film/video editor (Medical):   [name] Lack of Transportation (Non-Medical):   Physical Activity:   . Days of Exercise per Week:   . Minutes of Exercise per Session:   Stress:   . Feeling of Stress :   Social Connections:   . Frequency of Communication with Friends and Family:   . Frequency of Social Gatherings with Friends and Family:   . Attends Religious Services:   . Active Member of Clubs or Organizations:   . Attends Archivist Meetings:   [name] Marital Status:   Intimate Partner Violence:   . Fear of Current or Ex-Partner:   . Emotionally Abused:   [name] Physically Abused:   . Sexually Abused:    Current Outpatient Medications on File Prior to Visit  Medication Sig Dispense Refill  . acetaminophen (TYLENOL) 500 MG tablet Take 1,000 mg by mouth daily as needed for moderate pain.    [name] amLODipine (NORVASC) 5 MG tablet Take 1 tablet (5 mg total) by mouth daily. 90 tablet 1  . apixaban (ELIQUIS) 5 MG TABS tablet Take 1 tablet (5 mg total) by mouth 2 (two) times daily. 180 tablet 1  . Chlorphen-Phenyleph-APAP (CORICIDIN D COLD/FLU/SINUS) 2-5-325 MG TABS Take 2 tablets by mouth daily.    [name] denosumab (PROLIA) 60 MG/ML SOLN Inject 60 mg into the skin every 6 (six) months. Reported on 09/07/2015    . diclofenac Sodium (VOLTAREN) 1 % GEL Apply 4 g topically 4 (four) times daily. 100 g 2  . diphenhydramine-acetaminophen (TYLENOL PM) 25-500 MG TABS tablet Take 2 tablets by mouth at bedtime as needed (sleep/pain).    . fluticasone (FLONASE) 50 MCG/ACT nasal spray Place 2 sprays into both nostrils daily as needed for allergies or rhinitis. 16 g 2  . hydrochlorothiazide (HYDRODIURIL) 12.5 MG tablet Take 1 tablet (12.5 mg total) by mouth daily. 90 tablet 1  . levocetirizine (XYZAL) 5 MG tablet TAKE 1 TABLET (5 MG TOTAL) BY MOUTH EVERY EVENING. 30 tablet 3  . levothyroxine (SYNTHROID) 100 MCG tablet TAKE 1 TABLET BY MOUTH ONCE DAILY BEFORE BREAKFAST .  APPOINTMENT REQUIRED FOR FUTURE REFILLS 90 tablet 0  . lisinopril (ZESTRIL) 40 MG tablet Take 1  tablet (40 mg total) by mouth daily. 90 tablet 1  . loratadine (CLARITIN) 10 MG tablet TAKE 1 TABLET (10 MG TOTAL) BY MOUTH DAILY AS NEEDED FOR ALLERGIES. 100 tablet 2   Current Facility-Administered Medications on File Prior to Visit  Medication Dose Route Frequency Provider Last Rate Last Admin  . aminophylline injection 150 mg  150 mg Intravenous BID PRN Dorothy Spark, MD   150 mg at 04/20/15 1200   Allergies  Allergen Reactions  . Tape Rash  . Codeine Nausea And Vomiting   Family History  Problem Relation Age of Onset  . Heart disease Mother   . Stroke Mother   . Hypertension Mother   . Heart failure Father   . Hypertension Brother   . Heart disease Brother   . Diverticulosis Sister   . Hypertension Brother   . Cancer Son        breast  . Heart attack Neg Hx     PE: BP (!) 150/98   Pulse 90   Ht '5\' 3"'  (1.6 m)   Wt 143 lb (64.9 kg)   SpO2 99%   BMI 25.33 kg/m  Wt Readings from Last 3 Encounters:  11/16/19 143 lb (64.9 kg)  07/23/19 146 lb (66.2 kg)  07/12/19 145 lb (65.8 kg)   Constitutional: normal weight, in NAD Eyes: PERRLA, EOMI, no exophthalmos ENT: moist mucous membranes, no thyromegaly, no cervical lymphadenopathy Cardiovascular: RRR, No MRG Respiratory: CTA B Gastrointestinal: abdomen soft, NT, ND, BS+ Musculoskeletal: no deformities, strength intact in all 4 Skin: moist, warm, no rashes Neurological: no tremor with outstretched hands, DTR normal in all 4  Assessment: 1. Osteoporosis  2.  Vitamin D insufficiency  Plan: 1. Osteoporosis -Likely postmenopausal (early menopause, no HRT), + age-related, + she also has family history of OP -Reviewed the latest 3 DXA scan reports, last in 2018.  Based on the T-scores, she has an increased risk for fracture.  She already had several vertebral fractures so her risk is quite high.  Possible risk factors were  early menopause, missed Prolia injections. She did not have a history of excessive steroid use, no history of hyperparathyroidism (this was rechecked at last visit), no history of severe vitamin D deficiency, no RA, no signs of Cushing syndrome, no systemic mastocytosis, no history of cancer, no renal failure to indicate multiple myeloma, no history of low alkaline phosphatase, no previous fractures before her vertebral once or blue sclerae, to indicate osteogenesis imperfecta.   -We did discuss in the past about not missing Prolia injections or delaying by more than 1 month since this increases her risk of fracture and decreases her BMD precipitously.  Her latest bone density scans show stability, after she started to take Prolia consistently.  In total, she was on Prolia for 9 years, however, she missed doses.  Latest injection was 06/01/2019 and she is due for another injection soon. -We will continue Prolia for a total of 10 years, then give her 1-2 doses of Reclast IV.  Of note, she has already been on 2 years of Forteo.  Therefore, we do not have many options for treatment but Romosozumab is now out on the market and we could use this is absolutely needed. -She continues a calcium supplement and continues to get a good amount of calcium from the diet -I again discussed with her about fall precautions and advised her to do balance and weightbearing exercises - use bands, weights -At today's visit, we will check a  BMP and a vitamin D level -She was due for another DXA scan after 05/30/2019.  We will order this today. -I will see her back in 1 year  2.  Vitamin D insufficiency -We added 2000 units vitamin D daily at last visit.  She is currently taking 2000 units + 200 units from [facility] -Previous levels were slightly low -Latest level was normal in 12/2018 -We will recheck the level today   Component     Latest Ref Rng & Units 11/16/2019  Sodium     135 - 145 mEq/L 134 (L)  Potassium     3.5 -  5.1 mEq/L 4.4  Chloride     96 - 112 mEq/L 98  CO2     19 - 32 mEq/L 31  Glucose     70 - 99 mg/dL 91  BUN     6 - 23 mg/dL 24 (H)  Creatinine     0.40 - 1.20 mg/dL 1.04  Calcium     8.4 - 10.5 mg/dL 9.9  GFR     >60.00 mL/min 61.49  Vitamin D, 25-Hydroxy     30.0 - 100.0 ng/mL 52.5    Philemon Kingdom, MD PhD [facility] Endocrinology

## 2019-11-16 NOTE — Patient Instructions (Addendum)
Please stop at the lab.  Will schedule a new bone density.  Please come back for a follow-up appointment in 1 year.   Exercise for Strong Bones (from [place]) There are two types of exercises that are important for building and maintaining bone density:  weight-bearing and muscle-strengthening exercises. Weight-bearing Exercises These exercises include activities that make you move against gravity while staying upright. Weight-bearing exercises can be high-impact or low-impact. High-impact weight-bearing exercises help build bones and keep them strong. If you have broken a bone due to osteoporosis or are at risk of breaking a bone, you may need to avoid high-impact exercises. If you're not sure, you should check with your healthcare provider. Examples of high-impact weight-bearing exercises are: . Dancing . Doing high-impact aerobics . Hiking . Jogging/running . Jumping Rope . Stair climbing . Tennis Low-impact weight-bearing exercises can also help keep bones strong and are a safe alternative if you cannot do high-impact exercises. Examples of low-impact weight-bearing exercises are: . Using elliptical training machines . Doing low-impact aerobics . Using stair-step machines . Fast walking on a treadmill or outside Muscle-Strengthening Exercises These exercises include activities where you move your body, a weight or some other resistance against gravity. They are also known as resistance exercises and include: . Lifting weights . Using elastic exercise bands . Using weight machines . Lifting your own body weight . Functional movements, such as standing and rising up on your toes Yoga and Pilates can also improve strength, balance and flexibility. However, certain positions may not be safe for people with osteoporosis or those at increased risk of broken bones. For example, exercises that have you bend forward may increase the chance of breaking a bone in the  spine. A physical therapist should be able to help you learn which exercises are safe and appropriate for you. Non-Impact Exercises Non-impact exercises can help you to improve balance, posture and how well you move in everyday activities. These exercises can also help to increase muscle strength and decrease the risk of falls and broken bones. Some of these exercises include: . Balance exercises that strengthen your legs and test your balance, such as Tai Chi, can decrease your risk of falls. . Posture exercises that improve your posture and reduce rounded or "sloping" shoulders can help you decrease the chance of breaking a bone, especially in the spine. . Functional exercises that improve how well you move can help you with everyday activities and decrease your chance of falling and breaking a bone. For example, if you have trouble getting up from a chair or climbing stairs, you should do these activities as exercises. A physical therapist can teach you balance, posture and functional exercises. Starting a New Exercise Program If you haven't exercised regularly for a while, check with your healthcare provider before beginning a new exercise program-particularly if you have health problems such as heart disease, diabetes or high blood pressure. If you're at high risk of breaking a bone, you should work with a physical therapist to develop a safe exercise program. Once you have your healthcare provider's approval, start slowly. If you've already broken bones in the spine because of osteoporosis, be very careful to avoid activities that require reaching down, bending forward, rapid twisting motions, heavy lifting and those that increase your chance of a fall. As you get started, your muscles may feel sore for a day or two after you exercise. If soreness lasts longer, you may be working too hard and need to ease  up. Exercises should be done in a pain-free range of motion. How Much Exercise Do You  Need? Weight-bearing exercises 30 minutes on most days of the week. Do a 30-minutesession or multiple sessions spread out throughout the day. The benefits to your bones are the same.   Muscle-strengthening exercises Two to three days per week. If you don't have much time for strengthening/resistance training, do small amounts at a time. You can do just one body part each day. For example do arms one day, legs the next and trunk the next. You can also spread these exercises out during your normal day.  Balance, posture and functional exercises Every day or as often as needed. You may want to focus on one area more than the others. If you have fallen or lose your balance, spend time doing balance exercises. If you are getting rounded shoulders, work more on posture exercises. If you have trouble climbing stairs or getting up from the couch, do more functional exercises. You can also perform these exercises at one time or spread them during your day. Work with a phyiscal therapist to learn the right exercises for you.

## 2019-11-17 LAB — VITAMIN D 25 HYDROXY (VIT D DEFICIENCY, FRACTURES): Vit D, 25-Hydroxy: 52.5 ng/mL (ref 30.0–100.0)

## 2019-12-02 ENCOUNTER — Ambulatory Visit (HOSPITAL_BASED_OUTPATIENT_CLINIC_OR_DEPARTMENT_OTHER)
Admission: RE | Admit: 2019-12-02 | Discharge: 2019-12-02 | Disposition: A | Discharge status: Home / Self Care | Payer: Medicare Other | Source: Ambulatory Visit | Attending: Internal Medicine | Admitting: Internal Medicine

## 2019-12-02 ENCOUNTER — Other Ambulatory Visit: Payer: Self-pay

## 2019-12-02 DIAGNOSIS — M810 Age-related osteoporosis without current pathological fracture: Secondary | ICD-10-CM | POA: Diagnosis present

## 2019-12-02 MED FILL — AMLODIPINE BESYLATE 5 MG TA: 5 | 90 days supply | Qty: 90 | Fill #1

## 2019-12-10 NOTE — Telephone Encounter (Signed)
Received summary of benefits, and it states that the patient does not need a PA for Prolia. Patient does have a deductible of $203 for the primary insurance, and this has been met. Patient also has a $300 deductible for secondary insurance, and this has also been met. After deductibles have been met, patient does not owe anything at all for injection.   Patient is ready to be scheduled and owes $0.  Called pt and she has been scheduled for 12/15/19 at 1000.

## 2019-12-13 ENCOUNTER — Other Ambulatory Visit: Payer: Self-pay | Admitting: Family Medicine

## 2019-12-14 ENCOUNTER — Ambulatory Visit: Payer: Medicare Other

## 2019-12-14 MED FILL — LEVOCETIRIZINE 5 MG TABLET: 5 | 30 days supply | Qty: 30 | Fill #0

## 2019-12-15 ENCOUNTER — Ambulatory Visit (INDEPENDENT_AMBULATORY_CARE_PROVIDER_SITE_OTHER): Payer: Medicare Other

## 2019-12-15 ENCOUNTER — Other Ambulatory Visit: Payer: Self-pay

## 2019-12-15 ENCOUNTER — Other Ambulatory Visit (INDEPENDENT_AMBULATORY_CARE_PROVIDER_SITE_OTHER): Payer: Medicare Other

## 2019-12-15 ENCOUNTER — Ambulatory Visit: Payer: Medicare Other

## 2019-12-15 DIAGNOSIS — M810 Age-related osteoporosis without current pathological fracture: Secondary | ICD-10-CM | POA: Diagnosis not present

## 2019-12-15 DIAGNOSIS — E559 Vitamin D deficiency, unspecified: Secondary | ICD-10-CM

## 2019-12-15 MED ORDER — DENOSUMAB 60 MG/ML ~~LOC~~ SOSY
60.0000 mg | PREFILLED_SYRINGE | Freq: Once | SUBCUTANEOUS | Status: AC
  Administered 2019-12-15: 60 mg via SUBCUTANEOUS

## 2019-12-15 NOTE — Progress Notes (Signed)
After obtaining consent, and per orders of Dr. Cruzita Lederer, injection of prolia 60 mg given by Gwinda Maine. Patient instructed to remain in clinic for 20 minutes afterwards, and to report any adverse reaction to me immediately.

## 2019-12-22 MED FILL — LORATADINE 10 MG TABS: 10 | 90 days supply | Qty: 90 | Fill #1

## 2019-12-23 ENCOUNTER — Encounter: Payer: Self-pay | Admitting: Family Medicine

## 2019-12-23 ENCOUNTER — Other Ambulatory Visit: Payer: Self-pay

## 2019-12-23 ENCOUNTER — Ambulatory Visit (INDEPENDENT_AMBULATORY_CARE_PROVIDER_SITE_OTHER): Payer: Medicare Other | Admitting: Family Medicine

## 2019-12-23 VITALS — BP 156/74 | HR 77 | Temp 97.3°F | Resp 16 | Ht 63.0 in | Wt 147.0 lb

## 2019-12-23 DIAGNOSIS — E039 Hypothyroidism, unspecified: Secondary | ICD-10-CM | POA: Diagnosis not present

## 2019-12-23 DIAGNOSIS — E890 Postprocedural hypothyroidism: Secondary | ICD-10-CM | POA: Diagnosis not present

## 2019-12-23 DIAGNOSIS — M545 Low back pain, unspecified: Secondary | ICD-10-CM

## 2019-12-23 DIAGNOSIS — R200 Anesthesia of skin: Secondary | ICD-10-CM | POA: Insufficient documentation

## 2019-12-23 DIAGNOSIS — R202 Paresthesia of skin: Secondary | ICD-10-CM

## 2019-12-23 MED ORDER — LEVOTHYROXINE SODIUM 100 MCG PO TABS: ORAL_TABLET | ORAL | 3 refills | Status: DC

## 2019-12-23 MED FILL — FLUTICASONE PROP 50 MCG SPR: 50 | 30 days supply | Qty: 16 | Fill #2

## 2019-12-23 NOTE — Patient Instructions (Signed)
Neuropathic Pain Neuropathic pain is pain caused by damage to the nerves that are responsible for certain sensations in your body (sensory nerves). The pain can be caused by:  Damage to the sensory nerves that send signals to your spinal cord and brain (peripheral nervous system).  Damage to the sensory nerves in your brain or spinal cord (central nervous system). Neuropathic pain can make you more sensitive to pain. Even a minor sensation can feel very painful. This is usually a long-term condition that can be difficult to treat. The type of pain differs from person to person. It may:  Start suddenly (acute), or it may develop slowly and last for a long time (chronic).  Come and go as damaged nerves heal, or it may stay at the same level for years.  Cause emotional distress, loss of sleep, and a lower quality of life. What are the causes? The most common cause of this condition is diabetes. Many other diseases and conditions can also cause neuropathic pain. Causes of neuropathic pain can be classified as:  Toxic. This is caused by medicines and chemicals. The most common cause of toxic neuropathic pain is damage from cancer treatments (chemotherapy).  Metabolic. This can be caused by: ? Diabetes. This is the most common disease that damages the nerves. ? Lack of vitamin B from long-term alcohol abuse.  Traumatic. Any injury that cuts, crushes, or stretches a nerve can cause damage and pain. A common example is feeling pain after losing an arm or leg (phantom limb pain).  Compression-related. If a sensory nerve gets trapped or compressed for a long period of time, the blood supply to the nerve can be cut off.  Vascular. Many blood vessel diseases can cause neuropathic pain by decreasing blood supply and oxygen to nerves.  Autoimmune. This type of pain results from diseases in which the body's defense system (immune system) mistakenly attacks sensory nerves. Examples of autoimmune diseases  that can cause neuropathic pain include lupus and multiple sclerosis.  Infectious. Many types of viral infections can damage sensory nerves and cause pain. Shingles infection is a common cause of this type of pain.  Inherited. Neuropathic pain can be a symptom of many diseases that are passed down through families (genetic). What increases the risk? You are more likely to develop this condition if:  You have diabetes.  You smoke.  You drink too much alcohol.  You are taking certain medicines, including medicines that kill cancer cells (chemotherapy) or that treat immune system disorders. What are the signs or symptoms? The main symptom is pain. Neuropathic pain is often described as:  Burning.  Shock-like.  Stinging.  Hot or cold.  Itching. How is this diagnosed? No single test can diagnose neuropathic pain. It is diagnosed based on:  Physical exam and your symptoms. Your health care provider will ask you about your pain. You may be asked to use a pain scale to describe how bad your pain is.  Tests. These may be done to see if you have a high sensitivity to pain and to help find the cause and location of any sensory nerve damage. They include: ? Nerve conduction studies to test how well nerve signals travel through your sensory nerves (electrodiagnostic testing). ? Stimulating your sensory nerves through electrodes on your skin and measuring the response in your spinal cord and brain (somatosensory evoked potential).  Imaging studies, such as: ? X-rays. ? CT scan. ? MRI. How is this treated? Treatment for neuropathic pain may change   over time. You may need to try different treatment options or a combination of treatments. Some options include:  Treating the underlying cause of the neuropathy, such as diabetes, kidney disease, or vitamin deficiencies.  Stopping medicines that can cause neuropathy, such as chemotherapy.  Medicine to relieve pain. Medicines may  include: ? Prescription or over-the-counter pain medicine. ? Anti-seizure medicine. ? Antidepressant medicines. ? Pain-relieving patches that are applied to painful areas of skin. ? A medicine to numb the area (local anesthetic), which can be injected as a nerve block.  Transcutaneous nerve stimulation. This uses electrical currents to block painful nerve signals. The treatment is painless.  Alternative treatments, such as: ? Acupuncture. ? Meditation. ? Massage. ? Physical therapy. ? Pain management programs. ? Counseling. Follow these instructions at home: Medicines   Take over-the-counter and prescription medicines only as told by your health care provider.  Do not drive or use heavy machinery while taking prescription pain medicine.  If you are taking prescription pain medicine, take actions to prevent or treat constipation. Your health care provider may recommend that you: ? Drink enough fluid to keep your urine pale yellow. ? Eat foods that are high in fiber, such as fresh fruits and vegetables, whole grains, and beans. ? Limit foods that are high in fat and processed sugars, such as fried or sweet foods. ? Take an over-the-counter or prescription medicine for constipation. Lifestyle   Have a good support system at home.  Consider joining a chronic pain support group.  Do not use any products that contain nicotine or tobacco, such as cigarettes and e-cigarettes. If you need help quitting, ask your health care provider.  Do not drink alcohol. General instructions  Learn as much as you can about your condition.  Work closely with all your health care providers to find the treatment plan that works best for you.  Ask your health care provider what activities are safe for you.  Keep all follow-up visits as told by your health care provider. This is important. Contact a health care provider if:  Your pain treatments are not working.  You are having side effects  from your medicines.  You are struggling with tiredness (fatigue), mood changes, depression, or anxiety. Summary  Neuropathic pain is pain caused by damage to the nerves that are responsible for certain sensations in your body (sensory nerves).  Neuropathic pain may come and go as damaged nerves heal, or it may stay at the same level for years.  Neuropathic pain is usually a long-term condition that can be difficult to treat. Consider joining a chronic pain support group. This information is not intended to replace advice given to you by your health care provider. Make sure you discuss any questions you have with your health care provider. Document Revised: 10/08/2018 Document Reviewed: 07/04/2017 Elsevier Patient Education  2020 [employer]  

## 2019-12-23 NOTE — Assessment & Plan Note (Signed)
Check ls spine xray

## 2019-12-23 NOTE — Assessment & Plan Note (Signed)
Check labs Cont' meds 

## 2019-12-23 NOTE — Assessment & Plan Note (Signed)
?   If coming from low back  Check xray  Consider mri vs neurology

## 2019-12-23 NOTE — Progress Notes (Signed)
Patient ID: [name], female    DOB: 07-21-1940  Age: 79 y.o. MRN: [id number]    Subjective:  Subjective  HPI [name] presents for f/u thyroid.   She c/o burning and numbness in her feet and itching skin -- she is concerned about her kidneys   Review of Systems  Constitutional: Negative for appetite change, diaphoresis, fatigue and unexpected weight change.  Eyes: Negative for pain, redness and visual disturbance.  Respiratory: Negative for cough, chest tightness, shortness of breath and wheezing.   Cardiovascular: Negative for chest pain, palpitations and leg swelling.  Endocrine: Negative for cold intolerance, heat intolerance, polydipsia, polyphagia and polyuria.  Genitourinary: Negative for difficulty urinating, dysuria and frequency.  Neurological: Positive for numbness. Negative for dizziness, light-headedness and headaches.    History Past Medical History:  Diagnosis Date  . Atrioventricular block, complete ([place])    a. 08/2011 Upgrage of PPM to MDT Adapta L Dual Chamber PPM ser # [id number].  . Cardiac pacemaker in situ   . Contact dermatitis and eczema    unspec cause  . Dairy product intolerance   . Depression   . Diastolic dysfunction   . GERD (gastroesophageal reflux disease)   . Hiatal hernia   . History of CHF (congestive heart failure)   . History of diverticulitis of colon    2003  perforated diverticulitis w/ surgical intervention  . History of kidney stones    age 56  . History of peripheral edema    lower extremities  . History of thyroid nodule    multinodular goiter  s/p  total thyroidectomy 2014  . HTN (hypertension)   . Hydronephrosis, left   . Hypothyroidism   . Mild sleep apnea    per study 04-14-2014  . Nocturia   . Osteoporosis   . PAF (paroxysmal atrial fibrillation) ([place])   . PONV (postoperative nausea and vomiting)   . Pulmonary nodules    stable per last ct  . Renal cell carcinoma of left kidney ([facility])   . Shortness of  breath    intermittant  . Superficial thrombophlebitis     She has a past surgical history that includes Thyroidectomy (N/A, 08/07/2012); Colonoscopy (01/19/2013); pacemaker generator change (Left, 09/16/2011); pacemaker placement (05-11-2003  ); EXPLORATORY LAPARTOMY SIGMOID COLECTOMY/ COLOSTOMY (04-28-2002); Colostomy takedown (07-22-2002); LAPAROSCOPY VENTRAL HERNIA REPAIR/  EXTENSIVE LYSIS ADHESIONS (05-04-2004); Cardiovascular stress test (04-20-2015  dr Caryl Comes); transthoracic echocardiogram (10-12-2014); Cystoscopy with retrograde pyelogram, ureteroscopy and stent placement (Left, 07/28/2015); Robot assisted laparoscopic nephrectomy (Left, 03/20/2016); and Kyphoplasty.   Her family history includes Cancer in her son; Diverticulosis in her sister; Heart disease in her brother and mother; Heart failure in her father; Hypertension in her brother, brother, and mother; Stroke in her mother.She reports that she quit smoking about 40 years ago. Her smoking use included cigarettes. She quit after 15.00 years of use. She has never used smokeless tobacco. She reports that she does not drink alcohol and does not use drugs.  Current Outpatient Medications on File Prior to Visit  Medication Sig Dispense Refill  . acetaminophen (TYLENOL) 500 MG tablet Take 1,000 mg by mouth daily as needed for moderate pain.    [name] amLODipine (NORVASC) 5 MG tablet Take 1 tablet (5 mg total) by mouth daily. 90 tablet 1  . apixaban (ELIQUIS) 5 MG TABS tablet Take 1 tablet (5 mg total) by mouth 2 (two) times daily. 180 tablet 1  . Chlorphen-Phenyleph-APAP (CORICIDIN D COLD/FLU/SINUS) 2-5-325 MG TABS Take 2 tablets by  mouth daily.    [name] denosumab (PROLIA) 60 MG/ML SOSY injection Inject 60 mg into the skin every 6 (six) months.    . diclofenac Sodium (VOLTAREN) 1 % GEL Apply 4 g topically 4 (four) times daily. 100 g 2  . diphenhydramine-acetaminophen (TYLENOL PM) 25-500 MG TABS tablet Take 2 tablets by mouth at bedtime as needed  (sleep/pain).    . fluticasone (FLONASE) 50 MCG/ACT nasal spray Place 2 sprays into both nostrils daily as needed for allergies or rhinitis. 16 g 2  . hydrochlorothiazide (HYDRODIURIL) 12.5 MG tablet Take 1 tablet (12.5 mg total) by mouth daily. 90 tablet 1  . levocetirizine (XYZAL) 5 MG tablet TAKE 1 TABLET (5 MG TOTAL) BY MOUTH EVERY EVENING. 30 tablet 3  . lisinopril (ZESTRIL) 40 MG tablet Take 1 tablet (40 mg total) by mouth daily. 90 tablet 1  . loratadine (CLARITIN) 10 MG tablet TAKE 1 TABLET (10 MG TOTAL) BY MOUTH DAILY AS NEEDED FOR ALLERGIES. 100 tablet 2   Current Facility-Administered Medications on File Prior to Visit  Medication Dose Route Frequency Provider Last Rate Last Admin  . aminophylline injection 150 mg  150 mg Intravenous BID PRN Dorothy Spark, MD   150 mg at 04/20/15 1200     Objective:  Objective  Physical Exam Vitals and nursing note reviewed.  Constitutional:      Appearance: She is well-developed.  HENT:     Head: Normocephalic and atraumatic.  Eyes:     Conjunctiva/sclera: Conjunctivae normal.  Neck:     Thyroid: No thyromegaly.     Vascular: No carotid bruit or JVD.  Cardiovascular:     Rate and Rhythm: Normal rate and regular rhythm.     Heart sounds: Normal heart sounds. No murmur heard.   Pulmonary:     Effort: Pulmonary effort is normal. No respiratory distress.     Breath sounds: Normal breath sounds. No wheezing or rales.  Chest:     Chest wall: No tenderness.  Musculoskeletal:     Cervical back: Normal range of motion and neck supple.  Neurological:     Mental Status: She is alert and oriented to person, place, and time.    BP (!) 156/74 (BP Location: Left Arm, Patient Position: Sitting, Cuff Size: Small)   Pulse 77   Temp (!) 97.3 F (36.3 C) (Temporal)   Resp 16   Ht 5\' 3"  (1.6 m)   Wt 147 lb (66.7 kg)   SpO2 100%   BMI 26.04 kg/m  Wt Readings from Last 3 Encounters:  12/23/19 147 lb (66.7 kg)  11/16/19 143 lb (64.9 kg)    07/23/19 146 lb (66.2 kg)     Lab Results  Component Value Date   WBC 6.2 07/23/2019   HGB 13.5 07/23/2019   HCT 40.8 07/23/2019   PLT 178 07/23/2019   GLUCOSE 91 11/16/2019   CHOL 175 07/12/2019   TRIG 98.0 07/12/2019   HDL 49.90 07/12/2019   LDLCALC 106 (H) 07/12/2019   ALT 15 07/12/2019   AST 24 07/12/2019   NA 134 (L) 11/16/2019   K 4.4 11/16/2019   CL 98 11/16/2019   CREATININE 1.04 11/16/2019   BUN 24 (H) 11/16/2019   CO2 31 11/16/2019   TSH 3.04 07/12/2019   INR 1.01 11/04/2013   HGBA1C 5.5 06/07/2016    DG BONE DENSITY (DXA)  Result Date: 12/02/2019 EXAM: DUAL X-RAY ABSORPTIOMETRY (DXA) FOR BONE MINERAL DENSITY IMPRESSION: CRISTINA GHERGHE Your patient Kendallyn Lippold completed a BMD  test on 12/02/2019 using the [place] (analysis version: 16.SP2) manufactured by [employer]. The following summarizes the results of our evaluation. [facility] PATIENT: Name: Renezmae, Canlas Patient ID: [id number] Birth Date: 1940-09-03 Height: 63.0 in. Gender: Female Measured: 12/02/2019 Weight: 143.0 lbs. Indications: Advanced Age, Caucasian, Estrogen Deficiency, Family Hx of Osteoporosis, Height Loss, History of Fracture (Adult), History of Osteopenia, Hypothyroidism, Post Menopausal, Previous Tobacco User, Secondary Osteoporosis Fractures: Lumbar Spine, Thoracic Spine Treatments: Calcium, Forteo, Fosamax(Alendronate), Levothyroxine, Multivitamin, Prolia, Vitamin D ASSESSMENT: The BMD measured at Forearm Radius 33% is 0.613 g/cm2 with a T-score of -3.0. This patient is considered osteoporotic according to [facility]) criteria. Lumbar spine was not utilized due to advanced degenerative changes. Compared with the prior study on, 05/29/2017 the BMD of the total mean shows a statistically significant increase. The scan quality is good. Site Region Measured Date Measured Age WHO YA BMD Classification T-score DualFemur Total Mean 12/02/2019 81.3 Osteopenia -2.0 0.753 g/cm2  DualFemur Total Mean 05/29/2017 78.8 Osteoporosis -2.5 0.688 g/cm2 DualFemur Total Mean 05/29/2015 76.8 Osteopenia -2.3 0.723 g/cm2 Left Forearm Radius 33% 12/02/2019 81.3 Osteoporosis -3.0 0.613 g/cm2 Left Forearm Radius 33% 05/29/2017 78.8 Osteoporosis -3.2 0.600 g/cm2 Left Forearm Radius 33% 05/29/2015 76.8 Osteoporosis -3.1 0.606 g/cm2 World Health Organization [facility]) criteria for post-menopausal, Caucasian Women: Normal       T-score at or above -1 SD Osteopenia   T-score between -1 and -2.5 SD Osteoporosis T-score at or below -2.5 SD RECOMMENDATION:1. All patients should optimize calcium and vitamin D intake. 2. Consider FDA-approved medical therapies in postmenopausal women and men aged 52 years and older, based on the following: a. A hip or vertebral(clinical or morphometric) fracture. b. T-Score < -2.5 at the femoral neck or spine after appropriate evaluation to exclude secondary causes c. Low bone mass (T-score between -1.0 and -2.5 at the femoral neck or spine) and a 10 year probability of a hip fracture >3% or a 10 year probability of major osteoporosis-related fracture > 20% based on the [place]-adapted WHO algorithm d. Clinical judgement and/or patient preferences may indicate treatment for people with 10-year fracture probabilities above or below these levels FOLLOW-UP: Patients with diagnosis of osteoporosis or at high risk for fracture should have regular bone mineral density tests. For patients eligible for Medicare, routine testing is allowed once every 2 years. The testing frequency can be increased to one year for patients who have rapidly progressing disease, those who are receiving or discontinuing medical therapy to restore bone mass, or have additional risk factors. I have reviewed this report, anf agree with the above findings. [facility]iology Electronically Signed   By: Rolm Baptise M.D.   On: 12/02/2019 15:52     Assessment & Plan:  Plan  I have changed Daisy Police. [name] "[name]"'s  levothyroxine. I am also having her maintain her diphenhydramine-acetaminophen, acetaminophen, fluticasone, loratadine, apixaban, amLODipine, hydrochlorothiazide, lisinopril, diclofenac Sodium, Coricidin D Cold/Flu/Sinus, levocetirizine, and denosumab.  Meds ordered this encounter  Medications  . levothyroxine (SYNTHROID) 100 MCG tablet    Sig: TAKE 1 TABLET BY MOUTH ONCE DAILY BEFORE BREAKFAST .    Dispense:  90 tablet    Refill:  3    Problem List Items Addressed This Visit      Unprioritized   Hypothyroidism, postsurgical    Check labs  Cont meds      Relevant Medications   levothyroxine (SYNTHROID) 100 MCG tablet   Low back pain with radiation - Primary    Check  ls spine xray      Relevant Orders   DG Lumbar Spine Complete   Numbness and tingling of both feet    ? If coming from low back  Check xray  Consider mri vs neurology       Other Visit Diagnoses    Hypothyroidism, unspecified type       Relevant Medications   levothyroxine (SYNTHROID) 100 MCG tablet      Follow-up: Return in about 6 months (around 06/23/2020), or if symptoms worsen or fail to improve.  [name], DO

## 2019-12-27 ENCOUNTER — Ambulatory Visit (HOSPITAL_BASED_OUTPATIENT_CLINIC_OR_DEPARTMENT_OTHER)
Admission: RE | Admit: 2019-12-27 | Discharge: 2019-12-27 | Disposition: A | Discharge status: Home / Self Care | Payer: Medicare Other | Source: Ambulatory Visit | Attending: Family Medicine | Admitting: Family Medicine

## 2019-12-27 ENCOUNTER — Other Ambulatory Visit: Payer: Self-pay

## 2019-12-27 DIAGNOSIS — M545 Low back pain, unspecified: Secondary | ICD-10-CM

## 2020-01-11 ENCOUNTER — Other Ambulatory Visit: Payer: Self-pay | Admitting: Family Medicine

## 2020-01-11 DIAGNOSIS — I10 Essential (primary) hypertension: Secondary | ICD-10-CM

## 2020-01-11 MED FILL — HYDROCHLOROTHIAZIDE 12.5 MG: 12.5 | 90 days supply | Qty: 90 | Fill #0

## 2020-01-24 ENCOUNTER — Other Ambulatory Visit: Payer: Self-pay | Admitting: Family Medicine

## 2020-01-24 DIAGNOSIS — I10 Essential (primary) hypertension: Secondary | ICD-10-CM

## 2020-01-24 MED FILL — LEVOCETIRIZINE 5 MG TABLET: 5 | 30 days supply | Qty: 30 | Fill #1

## 2020-01-24 MED FILL — LISINOPRIL 40 MG TABS: 40 | 90 days supply | Qty: 90 | Fill #0

## 2020-02-10 ENCOUNTER — Other Ambulatory Visit: Payer: Self-pay | Admitting: Family Medicine

## 2020-02-10 DIAGNOSIS — Z889 Allergy status to unspecified drugs, medicaments and biological substances status: Secondary | ICD-10-CM

## 2020-02-10 MED FILL — FLUTICASONE PROP 50 MCG SPR: 50 | 30 days supply | Qty: 16 | Fill #0

## 2020-02-28 MED FILL — AMLODIPINE BESYLATE 5 MG TA: 5 | 90 days supply | Qty: 90 | Fill #1

## 2020-02-28 MED FILL — LEVOCETIRIZINE 5 MG TABLET: 5 | 30 days supply | Qty: 30 | Fill #2

## 2020-03-24 MED FILL — FLUTICASONE PROP 50 MCG SPR: 50 | 30 days supply | Qty: 16 | Fill #1

## 2020-03-29 MED FILL — LEVOCETIRIZINE 5 MG TABLET: 5 | 30 days supply | Qty: 30 | Fill #3

## 2020-04-03 ENCOUNTER — Other Ambulatory Visit: Payer: Self-pay

## 2020-04-03 ENCOUNTER — Encounter: Payer: Self-pay | Admitting: Family Medicine

## 2020-04-03 ENCOUNTER — Other Ambulatory Visit (HOSPITAL_BASED_OUTPATIENT_CLINIC_OR_DEPARTMENT_OTHER): Payer: Self-pay | Admitting: Family Medicine

## 2020-04-03 ENCOUNTER — Ambulatory Visit (INDEPENDENT_AMBULATORY_CARE_PROVIDER_SITE_OTHER): Payer: Medicare Other | Admitting: Family Medicine

## 2020-04-03 VITALS — BP 120/88 | HR 73 | Temp 98.1°F | Resp 18 | Ht 63.0 in | Wt 145.6 lb

## 2020-04-03 DIAGNOSIS — Z23 Encounter for immunization: Secondary | ICD-10-CM

## 2020-04-03 DIAGNOSIS — I781 Nevus, non-neoplastic: Secondary | ICD-10-CM

## 2020-04-03 DIAGNOSIS — I10 Essential (primary) hypertension: Secondary | ICD-10-CM | POA: Diagnosis not present

## 2020-04-03 DIAGNOSIS — Z889 Allergy status to unspecified drugs, medicaments and biological substances status: Secondary | ICD-10-CM | POA: Diagnosis not present

## 2020-04-03 DIAGNOSIS — I83893 Varicose veins of bilateral lower extremities with other complications: Secondary | ICD-10-CM

## 2020-04-03 DIAGNOSIS — G44209 Tension-type headache, unspecified, not intractable: Secondary | ICD-10-CM | POA: Diagnosis not present

## 2020-04-03 MED ORDER — DICLOFENAC SODIUM 1 % EX GEL: 4.0000 g | Freq: Four times a day (QID) | CUTANEOUS | 2 refills | Status: DC

## 2020-04-03 MED ORDER — TIZANIDINE HCL 4 MG PO CAPS: 4.0000 mg | ORAL_CAPSULE | Freq: Three times a day (TID) | ORAL | 1 refills | Status: DC | PRN

## 2020-04-03 MED ORDER — LORATADINE 10 MG PO TABS: 10.0000 mg | ORAL_TABLET | Freq: Every day | ORAL | 2 refills | Status: DC | PRN

## 2020-04-03 MED FILL — tiZANidine HCL 4 MG TABS: 4 | 10 days supply | Qty: 30 | Fill #0

## 2020-04-03 MED FILL — LORATADINE 10 MG TABS: 10 | 90 days supply | Qty: 90 | Fill #0

## 2020-04-03 MED FILL — DICLOFENAC SODIUM 1 % GEL: 1 | 6 days supply | Qty: 100 | Fill #0

## 2020-04-03 NOTE — Progress Notes (Signed)
Patient ID: [name], female    DOB: 1941/05/13  Age: 79 y.o. MRN: [id number]    Subjective:  Subjective  HPI [name] presents for R sided neck/ head pain --- pt states pain starts at base of neck and shoots up to top of head.  It lasts 30 seconds.  Last severe episode was last sat--- but the lesser pain today to ear ---- tylenol seems to help some   She has not tried anything else    She also c/o spider veins and varicose veins which have become painful  Review of Systems  Constitutional: Negative for appetite change, diaphoresis, fatigue and unexpected weight change.  Eyes: Negative for pain, redness and visual disturbance.  Respiratory: Negative for cough, chest tightness, shortness of breath and wheezing.   Cardiovascular: Negative for chest pain, palpitations and leg swelling.  Endocrine: Negative for cold intolerance, heat intolerance, polydipsia, polyphagia and polyuria.  Genitourinary: Negative for difficulty urinating, dysuria and frequency.  Musculoskeletal: Positive for neck pain and neck stiffness.  Neurological: Negative for dizziness, light-headedness, numbness and headaches.    History Past Medical History:  Diagnosis Date  . Atrioventricular block, complete ([place])    a. 08/2011 Upgrage of PPM to MDT Adapta L Dual Chamber PPM ser # [id number].  . Cardiac pacemaker in situ   . Contact dermatitis and eczema    unspec cause  . Dairy product intolerance   . Depression   . Diastolic dysfunction   . GERD (gastroesophageal reflux disease)   . Hiatal hernia   . History of CHF (congestive heart failure)   . History of diverticulitis of colon    2003  perforated diverticulitis w/ surgical intervention  . History of kidney stones    age 72  . History of peripheral edema    lower extremities  . History of thyroid nodule    multinodular goiter  s/p  total thyroidectomy 2014  . HTN (hypertension)   . Hydronephrosis, left   . Hypothyroidism   . Mild sleep apnea     per study 04-14-2014  . Nocturia   . Osteoporosis   . PAF (paroxysmal atrial fibrillation) ([place])   . PONV (postoperative nausea and vomiting)   . Pulmonary nodules    stable per last ct  . Renal cell carcinoma of left kidney ([facility])   . Shortness of breath    intermittant  . Superficial thrombophlebitis     She has a past surgical history that includes Thyroidectomy (N/A, 08/07/2012); Colonoscopy (01/19/2013); pacemaker generator change (Left, 09/16/2011); pacemaker placement (05-11-2003  ); EXPLORATORY LAPARTOMY SIGMOID COLECTOMY/ COLOSTOMY (04-28-2002); Colostomy takedown (07-22-2002); LAPAROSCOPY VENTRAL HERNIA REPAIR/  EXTENSIVE LYSIS ADHESIONS (05-04-2004); Cardiovascular stress test (04-20-2015  dr Caryl Comes); transthoracic echocardiogram (10-12-2014); Cystoscopy with retrograde pyelogram, ureteroscopy and stent placement (Left, 07/28/2015); Robot assisted laparoscopic nephrectomy (Left, 03/20/2016); and Kyphoplasty.   Her family history includes Cancer in her son; Diverticulosis in her sister; Heart disease in her brother and mother; Heart failure in her father; Hypertension in her brother, brother, and mother; Stroke in her mother.She reports that she quit smoking about 40 years ago. Her smoking use included cigarettes. She quit after 15.00 years of use. She has never used smokeless tobacco. She reports that she does not drink alcohol and does not use drugs.  Current Outpatient Medications on File Prior to Visit  Medication Sig Dispense Refill  . acetaminophen (TYLENOL) 500 MG tablet Take 1,000 mg by mouth daily as needed for moderate pain.    [name]  amLODipine (NORVASC) 5 MG tablet Take 1 tablet (5 mg total) by mouth daily. 90 tablet 1  . apixaban (ELIQUIS) 5 MG TABS tablet Take 1 tablet (5 mg total) by mouth 2 (two) times daily. 180 tablet 1  . Chlorphen-Phenyleph-APAP (CORICIDIN D COLD/FLU/SINUS) 2-5-325 MG TABS Take 2 tablets by mouth daily.    [name] denosumab (PROLIA) 60 MG/ML SOSY injection  Inject 60 mg into the skin every 6 (six) months.    . diphenhydramine-acetaminophen (TYLENOL PM) 25-500 MG TABS tablet Take 2 tablets by mouth at bedtime as needed (sleep/pain).    . fluticasone (FLONASE) 50 MCG/ACT nasal spray PLACE 2 SPRAYS INTO BOTH NOSTRILS DAILY AS NEEDED FOR ALLERGIES OR RHINITIS. 16 g 2  . hydrochlorothiazide (HYDRODIURIL) 12.5 MG tablet TAKE 1 TABLET (12.5 MG TOTAL) BY MOUTH DAILY. 90 tablet 1  . levocetirizine (XYZAL) 5 MG tablet TAKE 1 TABLET (5 MG TOTAL) BY MOUTH EVERY EVENING. 30 tablet 3  . levothyroxine (SYNTHROID) 100 MCG tablet TAKE 1 TABLET BY MOUTH ONCE DAILY BEFORE BREAKFAST . 90 tablet 3  . lisinopril (ZESTRIL) 40 MG tablet TAKE 1 TABLET (40 MG TOTAL) BY MOUTH DAILY. 90 tablet 1   Current Facility-Administered Medications on File Prior to Visit  Medication Dose Route Frequency Provider Last Rate Last Admin  . aminophylline injection 150 mg  150 mg Intravenous BID PRN Dorothy Spark, MD   150 mg at 04/20/15 1200     Objective:  Objective  Physical Exam Vitals and nursing note reviewed.  Constitutional:      Appearance: She is well-developed.  HENT:     Head: Normocephalic and atraumatic.  Eyes:     Conjunctiva/sclera: Conjunctivae normal.  Neck:     Thyroid: No thyromegaly.     Vascular: No carotid bruit or JVD.  Cardiovascular:     Rate and Rhythm: Normal rate and regular rhythm.     Heart sounds: Normal heart sounds. No murmur heard.   Pulmonary:     Effort: Pulmonary effort is normal. No respiratory distress.     Breath sounds: Normal breath sounds. No wheezing or rales.  Chest:     Chest wall: No tenderness.  Musculoskeletal:        General: Tenderness present.       Arms:     Cervical back: Normal range of motion and neck supple.  Neurological:     Mental Status: She is alert and oriented to person, place, and time.    BP 120/88 (BP Location: Left Arm, Patient Position: Sitting, Cuff Size: Normal)   Pulse 73   Temp 98.1 F  (36.7 C) (Oral)   Resp 18   Ht 5\' 3"  (1.6 m)   Wt 145 lb 9.6 oz (66 kg)   SpO2 98%   BMI 25.79 kg/m  Wt Readings from Last 3 Encounters:  04/03/20 145 lb 9.6 oz (66 kg)  12/23/19 147 lb (66.7 kg)  11/16/19 143 lb (64.9 kg)     Lab Results  Component Value Date   WBC 6.2 07/23/2019   HGB 13.5 07/23/2019   HCT 40.8 07/23/2019   PLT 178 07/23/2019   GLUCOSE 91 11/16/2019   CHOL 175 07/12/2019   TRIG 98.0 07/12/2019   HDL 49.90 07/12/2019   LDLCALC 106 (H) 07/12/2019   ALT 15 07/12/2019   AST 24 07/12/2019   NA 134 (L) 11/16/2019   K 4.4 11/16/2019   CL 98 11/16/2019   CREATININE 1.04 11/16/2019   BUN 24 (H) 11/16/2019  CO2 31 11/16/2019   TSH 3.04 07/12/2019   INR 1.01 11/04/2013   HGBA1C 5.5 06/07/2016    DG Lumbar Spine Complete  Result Date: 12/27/2019 CLINICAL DATA:  Chronic low back pain with bilateral foot numbness. EXAM: LUMBAR SPINE - COMPLETE 4+ VIEW COMPARISON:  April 09, 2018. FINDINGS: Status post kyphoplasty of T9, T11, T12, L2, L4 and L5 vertebral bodies. No acute fracture or spondylolisthesis is noted. Mild degenerative disc disease is noted at L5-S1. IMPRESSION: Status post kyphoplasty at multiple levels. No acute abnormality seen in the lumbar spine. Mild degenerative disc disease is noted at L5-S1. Aortic Atherosclerosis (ICD10-I70.0). Electronically Signed   By: Marijo Conception M.D.   On: 12/27/2019 10:39     Assessment & Plan:  Plan  I am having Weslie Rasmus. Tourigny "Terri" start on tiZANidine. I am also having her maintain her diphenhydramine-acetaminophen, acetaminophen, apixaban, amLODipine, Coricidin D Cold/Flu/Sinus, levocetirizine, denosumab, levothyroxine, hydrochlorothiazide, lisinopril, fluticasone, diclofenac Sodium, and loratadine.  Meds ordered this encounter  Medications  . diclofenac Sodium (VOLTAREN) 1 % GEL    Sig: Apply 4 g topically 4 (four) times daily.    Dispense:  100 g    Refill:  2  . loratadine (CLARITIN) 10 MG tablet     Sig: Take 1 tablet (10 mg total) by mouth daily as needed for allergies.    Dispense:  100 tablet    Refill:  2  . tiZANidine (ZANAFLEX) 4 MG capsule    Sig: Take 1 capsule (4 mg total) by mouth 3 (three) times daily as needed for muscle spasms.    Dispense:  30 capsule    Refill:  1    Problem List Items Addressed This Visit      Unprioritized   Essential hypertension    Well controlled, no changes to meds. Encouraged heart healthy diet such as the DASH diet and exercise as tolerated.       H/O seasonal allergies    Stable con't antihistamine      Relevant Medications   loratadine (CLARITIN) 10 MG tablet   HEADACHE, TENSION - Primary    Muscle relaxer Massage Consider chiropractor       Relevant Medications   tiZANidine (ZANAFLEX) 4 MG capsule   Spider veins    Compression socks Refer to vascular       Relevant Orders   Ambulatory referral to Vascular Surgery   Varicose veins of bilateral lower extremities with other complications    Wear compression socks Referral to vascular placed      Relevant Orders   Ambulatory referral to Vascular Surgery    Other Visit Diagnoses    Need for influenza vaccination       Relevant Orders   Flu Vaccine QUAD High Dose(Fluad)      Follow-up: Return if symptoms worsen or fail to improve.  [name], DO

## 2020-04-03 NOTE — Patient Instructions (Signed)
Muscle Cramps and Spasms Muscle cramps and spasms occur when a muscle or muscles tighten and you have no control over this tightening (involuntary muscle contraction). They are a common problem and can develop in any muscle. The most common place is in the calf muscles of the leg. Muscle cramps and muscle spasms are both involuntary muscle contractions, but there are some differences between the two:  Muscle cramps are painful. They come and go and may last for a few seconds or up to 15 minutes. Muscle cramps are often more forceful and last longer than muscle spasms.  Muscle spasms may or may not be painful. They may also last just a few seconds or much longer. Certain medical conditions, such as diabetes or Parkinson's disease, can make it more likely to develop cramps or spasms. However, cramps or spasms are usually not caused by a serious underlying problem. Common causes include:  Doing more physical work or exercise than your body is ready for (overexertion).  Overuse from repeating certain movements too many times.  Remaining in a certain position for a long period of time.  Improper preparation, form, or technique while playing a sport or doing an activity.  Dehydration.  Injury.  Side effects of some medicines.  Abnormally low levels of the salts and minerals in your blood (electrolytes), especially potassium and calcium. This could happen if you are taking water pills (diuretics) or if you are pregnant. In many cases, the cause of muscle cramps or spasms is not known. Follow these instructions at home: Managing pain and stiffness      Try massaging, stretching, and relaxing the affected muscle. Do this for several minutes at a time.  If directed, apply heat to tight or tense muscles as often as told by your health care provider. Use the heat source that your health care provider recommends, such as a moist heat pack or a heating pad. ? Place a towel between your skin and  the heat source. ? Leave the heat on for 20-30 minutes. ? Remove the heat if your skin turns bright red. This is especially important if you are unable to feel pain, heat, or cold. You may have a greater risk of getting burned.  If directed, put ice on the affected area. This may help if you are sore or have pain after a cramp or spasm. ? Put ice in a plastic bag. ? Place a towel between your skin and the bag. ? Leavethe ice on for 20 minutes, 2-3 times a day.  Try taking hot showers or baths to help relax tight muscles. Eating and drinking  Drink enough fluid to keep your urine pale yellow. Staying well hydrated may help prevent cramps or spasms.  Eat a healthy diet that includes plenty of nutrients to help your muscles function. A healthy diet includes fruits and vegetables, lean protein, whole grains, and low-fat or nonfat dairy products. General instructions  If you are having frequent cramps, avoid intense exercise for several days.  Take over-the-counter and prescription medicines only as told by your health care provider.  Pay attention to any changes in your symptoms.  Keep all follow-up visits as told by your health care provider. This is important. Contact a health care provider if:  Your cramps or spasms get more severe or happen more often.  Your cramps or spasms do not improve over time. Summary  Muscle cramps and spasms occur when a muscle or muscles tighten and you have no control over this   tightening (involuntary muscle contraction). °· The most common place for cramps or spasms to occur is in the calf muscles of the leg. °· Massaging, stretching, and relaxing the affected muscle may relieve the cramp or spasm. °· Drink enough fluid to keep your urine pale yellow. Staying well hydrated may help prevent cramps or spasms. °This information is not intended to replace advice given to you by your health care provider. Make sure you discuss any questions you have with your  health care provider. °Document Revised: 11/10/2017 Document Reviewed: 11/10/2017 °Elsevier Patient Education © 2020 [employer] ° °

## 2020-04-04 ENCOUNTER — Encounter: Payer: Self-pay | Admitting: Family Medicine

## 2020-04-05 DIAGNOSIS — I83893 Varicose veins of bilateral lower extremities with other complications: Secondary | ICD-10-CM | POA: Insufficient documentation

## 2020-04-05 DIAGNOSIS — Z889 Allergy status to unspecified drugs, medicaments and biological substances status: Secondary | ICD-10-CM | POA: Insufficient documentation

## 2020-04-05 DIAGNOSIS — I781 Nevus, non-neoplastic: Secondary | ICD-10-CM | POA: Insufficient documentation

## 2020-04-05 NOTE — Assessment & Plan Note (Signed)
Muscle relaxer Massage Consider chiropractor

## 2020-04-05 NOTE — Assessment & Plan Note (Signed)
Wear compression socks Referral to vascular placed

## 2020-04-05 NOTE — Assessment & Plan Note (Signed)
Well controlled, no changes to meds. Encouraged heart healthy diet such as the DASH diet and exercise as tolerated.  °

## 2020-04-05 NOTE — Assessment & Plan Note (Signed)
Compression socks Refer to vascular

## 2020-04-05 NOTE — Assessment & Plan Note (Signed)
Stable con't antihistamine

## 2020-04-17 MED FILL — HYDROCHLOROTHIAZIDE 12.5 MG: 12.5 | 90 days supply | Qty: 90 | Fill #1

## 2020-04-17 MED FILL — LISINOPRIL 40 MG TABS: 40 | 90 days supply | Qty: 90 | Fill #1

## 2020-05-08 ENCOUNTER — Other Ambulatory Visit (HOSPITAL_BASED_OUTPATIENT_CLINIC_OR_DEPARTMENT_OTHER): Payer: Self-pay | Admitting: Internal Medicine

## 2020-05-08 ENCOUNTER — Ambulatory Visit: Payer: Medicare Other | Attending: Internal Medicine

## 2020-05-08 DIAGNOSIS — Z23 Encounter for immunization: Secondary | ICD-10-CM

## 2020-05-09 ENCOUNTER — Ambulatory Visit (INDEPENDENT_AMBULATORY_CARE_PROVIDER_SITE_OTHER): Payer: Medicare Other

## 2020-05-09 DIAGNOSIS — I442 Atrioventricular block, complete: Secondary | ICD-10-CM | POA: Diagnosis not present

## 2020-05-10 LAB — CUP PACEART REMOTE DEVICE CHECK
Battery Impedance: 1794 Ohm
Battery Remaining Longevity: 34 mo
Battery Voltage: 2.74 V
Brady Statistic AP VP Percent: 7 %
Brady Statistic AP VS Percent: 0 %
Brady Statistic AS VP Percent: 93 %
Brady Statistic AS VS Percent: 0 %
Date Time Interrogation Session: 20211110090019
Implantable Lead Implant Date: 20041110
Implantable Lead Implant Date: 20041110
Implantable Lead Location: 753859
Implantable Lead Location: 753860
Implantable Pulse Generator Implant Date: 20130318
Lead Channel Impedance Value: 373 Ohm
Lead Channel Impedance Value: 511 Ohm
Lead Channel Pacing Threshold Amplitude: 0.5 V
Lead Channel Pacing Threshold Amplitude: 0.625 V
Lead Channel Pacing Threshold Pulse Width: 0.4 ms
Lead Channel Pacing Threshold Pulse Width: 0.4 ms
Lead Channel Setting Pacing Amplitude: 2 V
Lead Channel Setting Pacing Amplitude: 2.5 V
Lead Channel Setting Pacing Pulse Width: 0.4 ms
Lead Channel Setting Sensing Sensitivity: 4 mV

## 2020-05-10 NOTE — Progress Notes (Signed)
Remote pacemaker transmission.   

## 2020-05-12 MED FILL — PFIZER-BIONTECH COVID-19 VA: 30 | 1 days supply | Qty: 0 | Fill #0

## 2020-05-19 ENCOUNTER — Other Ambulatory Visit: Payer: Self-pay | Admitting: Family Medicine

## 2020-05-19 ENCOUNTER — Encounter: Payer: Self-pay | Admitting: Family Medicine

## 2020-05-19 MED ORDER — LEVOCETIRIZINE DIHYDROCHLORIDE 5 MG PO TABS: 5.0000 mg | ORAL_TABLET | Freq: Every evening | ORAL | 3 refills | Status: DC

## 2020-05-19 MED FILL — LEVOCETIRIZINE 5 MG TABLET: 5 | 90 days supply | Qty: 90 | Fill #0

## 2020-06-08 ENCOUNTER — Other Ambulatory Visit: Payer: Self-pay | Admitting: Family Medicine

## 2020-06-08 DIAGNOSIS — I10 Essential (primary) hypertension: Secondary | ICD-10-CM

## 2020-06-08 MED FILL — AMLODIPINE BESYLATE 5 MG TA: 5 | 90 days supply | Qty: 90 | Fill #0

## 2020-06-12 MED FILL — DICLOFENAC SODIUM 1 % GEL: 1 | 6 days supply | Qty: 100 | Fill #1

## 2020-06-15 ENCOUNTER — Ambulatory Visit (INDEPENDENT_AMBULATORY_CARE_PROVIDER_SITE_OTHER): Payer: Medicare Other

## 2020-06-15 ENCOUNTER — Other Ambulatory Visit: Payer: Self-pay

## 2020-06-15 DIAGNOSIS — M810 Age-related osteoporosis without current pathological fracture: Secondary | ICD-10-CM | POA: Diagnosis not present

## 2020-06-15 MED ORDER — DENOSUMAB 60 MG/ML ~~LOC~~ SOSY
60.0000 mg | PREFILLED_SYRINGE | Freq: Once | SUBCUTANEOUS | Status: AC
  Administered 2020-06-15: 11:00:00 60 mg via SUBCUTANEOUS

## 2020-06-15 NOTE — Progress Notes (Signed)
Patient received prolia injection- tolerated well.

## 2020-06-27 ENCOUNTER — Telehealth: Payer: Self-pay

## 2020-06-27 ENCOUNTER — Other Ambulatory Visit: Payer: Self-pay | Admitting: *Deleted

## 2020-06-27 DIAGNOSIS — I83893 Varicose veins of bilateral lower extremities with other complications: Secondary | ICD-10-CM

## 2020-06-27 NOTE — Telephone Encounter (Signed)
Patient called back about a form for the [employer]. The patient would like [place] to fax the paperwork to the company after Dr. Graciela Husbands signs the paperwork.

## 2020-06-27 NOTE — Telephone Encounter (Addendum)
**Note De-Identified [place] Date Obfuscation** The provider page of a [employer] application was either left at the office or faxed to the office and was then emailed to me.  Since I am unsure what the pt wants [place] to do with the provider page once Dr Graciela Husbands signs it I called the pt but got no answer so I left a detailed message on her VM asking her to call us back to let us know if she wants [place] to fax the provider page to [facility] or if she wants to pick it up once Dr Graciela Husbands signs it.  I have emailed the provider page to Dr [place]n Bound nurse so she can obtain his signature, date it and hold onto it until the pt lets [place] know what she wants [place] to do with it.

## 2020-06-28 ENCOUNTER — Other Ambulatory Visit: Payer: Self-pay

## 2020-06-28 MED ORDER — APIXABAN 5 MG PO TABS: 5.0000 mg | ORAL_TABLET | Freq: Two times a day (BID) | ORAL | 1 refills | Status: DC

## 2020-06-28 NOTE — Telephone Encounter (Signed)
Pt last saw Dr Graciela Husbands 07/23/19, last labs 11/16/19 Creat 1.04, age 79, weight 66kg, based on specified criteria pt is on appropriate dosage of Eliquis 5mg  BID.  Will refill rx.

## 2020-07-10 ENCOUNTER — Other Ambulatory Visit: Payer: Self-pay | Admitting: Family Medicine

## 2020-07-10 ENCOUNTER — Ambulatory Visit (HOSPITAL_COMMUNITY)
Admission: RE | Admit: 2020-07-10 | Discharge: 2020-07-10 | Disposition: A | Discharge status: Home / Self Care | Payer: Medicare Other | Source: Ambulatory Visit | Attending: Physician Assistant | Admitting: Physician Assistant

## 2020-07-10 ENCOUNTER — Other Ambulatory Visit: Payer: Self-pay

## 2020-07-10 ENCOUNTER — Ambulatory Visit (INDEPENDENT_AMBULATORY_CARE_PROVIDER_SITE_OTHER): Payer: Medicare Other | Admitting: Physician Assistant

## 2020-07-10 VITALS — Resp 14 | Ht 63.5 in | Wt 145.0 lb

## 2020-07-10 DIAGNOSIS — I10 Essential (primary) hypertension: Secondary | ICD-10-CM

## 2020-07-10 DIAGNOSIS — I83893 Varicose veins of bilateral lower extremities with other complications: Secondary | ICD-10-CM

## 2020-07-10 MED FILL — FLUTICASONE PROP 50 MCG SPR: 50 | 30 days supply | Qty: 16 | Fill #2

## 2020-07-10 MED FILL — LISINOPRIL 40 MG TABS: 40 | 90 days supply | Qty: 90 | Fill #0

## 2020-07-10 MED FILL — HYDROCHLOROTHIAZIDE 12.5 MG: 12.5 | 90 days supply | Qty: 90 | Fill #0

## 2020-07-10 NOTE — Progress Notes (Signed)
VASCULAR & VEIN SPECIALISTS           OF [place]  History and Physical   [name] is a 80 y.o. female who presents with complaints of leg swelling.  She states that over the past few months, she has been awoken at night with pain in the left leg that starts in the thigh and goes down to her ankle.  This has happened a couple of times over the past couple of months with 10/10 pain.  She states that she also has some numbness, discoloration in both legs.  She also has cramping in her legs at night.  She does not get cramping with walking.  Her walking is limited by her breathing.  She says she has had varicose veins since she was in her 4's but has recently gotten symptomatic over the past few years.  She has hx of seven compression fractures in her back.  She states that she has had some left arm numbness ~ four times over the past four months.  She states it comes on and lasts a few minutes and then resolves.  She denies any visual changes, speech difficulties, facial droop or other weakness or numbness or clumsiness of an extremity.  She has never had a stroke.  She does have afib and is on Eliquis.  She has not missed any doses of her Eliquis.  Her swelling is better in the mornings after being in bed.   The patient has no history of DVT. Pt does have history of varicose vein.   Pt does have history of skin changes in lower legs.   There is a family history of venous disorders.   The patient has used compression stockings in the past but she states they are ankle high.  She occasionally elevates her legs.  The pt is not on a statin for cholesterol management.  The pt is not on a daily aspirin.   Other AC:  Eliquis The pt is on CCB, ACEI for hypertension.   The pt is not diabetic.   Tobacco hx:  former   Past Medical History:  Diagnosis Date  . Atrioventricular block, complete ([place])    a. 08/2011 Upgrage of PPM to MDT Adapta L Dual Chamber PPM ser # [id number].  .  Cardiac pacemaker in situ   . Contact dermatitis and eczema    unspec cause  . Dairy product intolerance   . Depression   . Diastolic dysfunction   . GERD (gastroesophageal reflux disease)   . Hiatal hernia   . History of CHF (congestive heart failure)   . History of diverticulitis of colon    2003  perforated diverticulitis w/ surgical intervention  . History of kidney stones    age 37  . History of peripheral edema    lower extremities  . History of thyroid nodule    multinodular goiter  s/p  total thyroidectomy 2014  . HTN (hypertension)   . Hydronephrosis, left   . Hypothyroidism   . Mild sleep apnea    per study 04-14-2014  . Nocturia   . Osteoporosis   . PAF (paroxysmal atrial fibrillation) ([place])   . PONV (postoperative nausea and vomiting)   . Pulmonary nodules    stable per last ct  . Renal cell carcinoma of left kidney (HCC)   . Shortness of breath    intermittant  . Superficial thrombophlebitis     Past Surgical History:  Procedure Laterality Date  . CARDIOVASCULAR STRESS TEST  04-20-2015  dr Caryl Comes   normal nuclear study/  no ischemia/  normal LV function and wall motion, ef 74%  . COLONOSCOPY  01/19/2013  . COLOSTOMY TAKEDOWN  07-22-2002  . CYSTOSCOPY WITH RETROGRADE PYELOGRAM, URETEROSCOPY AND STENT PLACEMENT Left 07/28/2015   Procedure: CYSTOSCOPY WITH LEFT RETROGRADE PYELOGRAM, POSSIBLE LEFT URETEROSCOPY AND STENT PLACEMENT;  Surgeon: Franchot Gallo, MD;  Location: [facility];  Service: Urology;  Laterality: Left;  . EXPLORATORY LAPARTOMY SIGMOID COLECTOMY/ COLOSTOMY  04-28-2002   perforated diverticulitis  . KYPHOPLASTY    . LAPAROSCOPY VENTRAL HERNIA REPAIR/  EXTENSIVE LYSIS ADHESIONS  05-04-2004  . PACEMAKER GENERATOR CHANGE Left 09/16/2011   MDT [place] pacemaker  . PACEMAKER PLACEMENT  05-11-2003     medtronic  . ROBOT ASSISTED LAPAROSCOPIC NEPHRECTOMY Left 03/20/2016   Procedure: XI ROBOTIC ASSISTED LAPAROSCOPIC RETROPERITONEAL  NEPHRECTOMY;  Surgeon: Alexis Frock, MD;  Location: [facility];  Service: Urology;  Laterality: Left;  . THYROIDECTOMY N/A 08/07/2012   Procedure: THYROIDECTOMY;  Surgeon: Earnstine Regal, MD;  Location: [facility];  Service: General;  Laterality: N/A;  . TRANSTHORACIC ECHOCARDIOGRAM  10-12-2014   moderate focal basal LVH,  ef 55-60%,  grade 2 diastolic dysfunction, mild AV calcification without stenosis,  mild MR,  trivial PR    Social History   Socioeconomic History  . Marital status: Single    Spouse name: Not on file  . Number of children: 2  . Years of education: 41  . Highest education level: Not on file  Occupational History  . 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 Smoking status: Former Smoker    Years: 15.00    Types: Cigarettes    Quit date: 10/06/1979    Years since quitting: 40.7  . Smokeless tobacco: Never Used  Substance and Sexual Activity  . Alcohol use: No  . Drug use: No  . Sexual activity: Not on file  Other Topics Concern  . 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  She was married for 22 years, divorced, and has remained single. She has 1 grown son, 1 grown daughter.  She has 3 grandchildren, 2 of her children   live in [place].  She is retired.  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   Social Determinants of Health   Financial Resource Strain: Not on file  Food Insecurity: Not on file  Transportation Needs: Not on file  Physical Activity: Not on file  Stress: Not on file  Social Connections: Not on file  Intimate Partner Violence: Not on file     Family History  Problem Relation Age of Onset  . Heart disease Mother   . Stroke Mother   . Hypertension Mother   . Heart failure Father   .  Hypertension Brother   . Heart disease Brother   . Diverticulosis Sister   . Hypertension Brother   . Cancer Son        breast  . Heart attack Neg Hx     Current Outpatient Medications  Medication Sig Dispense Refill  . acetaminophen (TYLENOL) 500 MG tablet Take 1,000 mg by mouth daily as needed for moderate pain.    [name]  amLODipine (NORVASC) 5 MG tablet Take 1 tablet (5 mg total) by mouth daily. 90 tablet 1  . apixaban (ELIQUIS) 5 MG TABS tablet Take 1 tablet (5 mg total) by mouth 2 (two) times daily. 180 tablet 1  . Chlorphen-Phenyleph-APAP (CORICIDIN D COLD/FLU/SINUS) 2-5-325 MG TABS Take 2 tablets by mouth daily.    [name] denosumab (PROLIA) 60 MG/ML SOSY injection Inject 60 mg into the skin every 6 (six) months.    . diclofenac Sodium (VOLTAREN) 1 % GEL Apply 4 g topically 4 (four) times daily. 100 g 2  . diphenhydramine-acetaminophen (TYLENOL PM) 25-500 MG TABS tablet Take 2 tablets by mouth at bedtime as needed (sleep/pain).    . fluticasone (FLONASE) 50 MCG/ACT nasal spray PLACE 2 SPRAYS INTO BOTH NOSTRILS DAILY AS NEEDED FOR ALLERGIES OR RHINITIS. 16 g 2  . hydrochlorothiazide (HYDRODIURIL) 12.5 MG tablet TAKE 1 TABLET BY MOUTH ONCE DAILY 90 tablet 1  . levocetirizine (XYZAL) 5 MG tablet Take 1 tablet (5 mg total) by mouth every evening. 90 tablet 3  . levothyroxine (SYNTHROID) 100 MCG tablet TAKE 1 TABLET BY MOUTH ONCE DAILY BEFORE BREAKFAST . 90 tablet 3  . lisinopril (ZESTRIL) 40 MG tablet TAKE 1 TABLET (40 MG TOTAL) BY MOUTH DAILY. 90 tablet 1  . loratadine (CLARITIN) 10 MG tablet Take 1 tablet (10 mg total) by mouth daily as needed for allergies. 100 tablet 2  . tiZANidine (ZANAFLEX) 4 MG capsule Take 1 capsule (4 mg total) by mouth 3 (three) times daily as needed for muscle spasms. (Patient not taking: Reported on 07/10/2020) 30 capsule 1   No current facility-administered medications for this visit.   Facility-Administered Medications Ordered in Other Visits  Medication Dose Route  Frequency Provider Last Rate Last Admin  . aminophylline injection 150 mg  150 mg Intravenous BID PRN Dorothy Spark, MD   150 mg at 04/20/15 1200    Allergies  Allergen Reactions  . Tape Rash  . Codeine Nausea And Vomiting    REVIEW OF SYSTEMS:   [X]  denotes positive finding, [ ]  denotes negative finding Cardiac  Comments:  Chest pain or chest pressure:    Shortness of breath upon exertion:    Short of breath when lying flat:    Irregular heart rhythm:        Vascular    Pain in calf, thigh, or hip brought on by ambulation:    Pain in feet at night that wakes you up from your sleep:  x See HPI  Blood clot in your veins:    Leg swelling:  x       Pulmonary    Oxygen at home:    Productive cough:     Wheezing:         Neurologic    Sudden weakness in arms or legs:     Sudden numbness in arms or legs:  x See HPI  Sudden onset of difficulty speaking or slurred speech:    Temporary loss of vision in one eye:     Problems with dizziness:         Gastrointestinal    Blood in stool:     Vomited blood:         Genitourinary    Burning when urinating:     Blood in urine:        Psychiatric    Major depression:         Hematologic    Bleeding problems:    Problems with blood  clotting too easily:        Skin    Rashes or ulcers:        Constitutional    Fever or chills:      PHYSICAL EXAMINATION:  Today's Vitals   07/10/20 1400  Resp: 14  SpO2: 99%  Weight: 145 lb (65.8 kg)  Height: 5' 3.5" (1.613 m)  PainSc: 10-Worst pain ever   Body mass index is 25.28 kg/m.   General:  WDWN in NAD; vital signs documented above Gait: Not observed HENT: WNL, normocephalic Pulmonary: normal non-labored breathing without wheezing Cardiac: irregular HR; without carotid bruits Skin: without rashes Vascular Exam/Pulses:  Right Left  Radial 2+ (normal) 2+ (normal)  DP 2+ (normal) 2+ (normal)  PT 2+ (normal) 2+ (normal)   Extremities: without ischemic changes,  without cellulitis; without open wounds; with skin color changes Musculoskeletal: no muscle wasting or atrophy  Neurologic: A&O X 3;  moving all extremities equally Psychiatric:  The pt has Normal affect.   Non-Invasive Vascular Imaging:   Venous duplex on 07/10/2020: Venous Reflux Times  +--------------+---------+------+-----------+------------+--------+  RIGHT     Reflux NoRefluxReflux TimeDiameter cmsComments               Yes                   +--------------+---------+------+-----------+------------+--------+  CFV            yes  >1 second             +--------------+---------+------+-----------+------------+--------+  FV mid    no                         +--------------+---------+------+-----------+------------+--------+  Popliteal   no                         +--------------+---------+------+-----------+------------+--------+  GSV at [facility]  no               0.62        +--------------+---------+------+-----------+------------+--------+  GSV prox thigh      yes  >500 ms   0.37        +--------------+---------+------+-----------+------------+--------+  GSV mid thigh       yes  >500 ms   0.36        +--------------+---------+------+-----------+------------+--------+  GSV dist thigh      yes  >500 ms   0.33        +--------------+---------+------+-----------+------------+--------+  GSV at knee        yes  >500 ms   0.24        +--------------+---------+------+-----------+------------+--------+  GSV prox calf       yes  >500 ms   0.19        +--------------+---------+------+-----------+------------+--------+  SSV Pop Fossa no               0.12         +--------------+---------+------+-----------+------------+--------+     +------------------+---------+------+-----------+------------+-------------  -+  LEFT       Reflux NoRefluxReflux TimeDiameter cmsComments                   Yes                    +------------------+---------+------+-----------+------------+-------------  CFV        no                           +------------------+---------+------+-----------+------------+-------------  FV mid      no                           +------------------+---------+------+-----------+------------+-------------  Popliteal     no                           +------------------+---------+------+-----------+------------+-------------  GSV at [facility]    no               0.67          +------------------+---------+------+-----------+------------+-------------  GSV prox thigh  no               0.36  courses medial  +------------------+---------+------+-----------+------------+-------------  GSV mid thigh   no               0.39  out of fascia   +------------------+---------+------+-----------+------------+-------------  GSV dist thigh        yes  >500 ms   0.29          +------------------+---------+------+-----------+------------+-------------  GSV at knee                        out of fascia   +------------------+---------+------+-----------+------------+-------------  SSV Pop Fossa                       NWV        +------------------+---------+------+-----------+------------+-------------   SSV prox calf   no               0.25           +------------------+---------+------+-----------+------------+-------------  anterior accessoryno               0.22          +------------------+---------+------+-----------+------------+-------------   Summary:  Right:  - No evidence of deep vein thrombosis from the common femoral through the  popliteal veins.  - No evidence of superficial venous thrombosis.  - The common femoral vein is not competent.  - The great saphenous vein is not competent.  - The small saphenous vein is competent.    Left:  - No evidence of deep vein thrombosis from the common femoral through the  popliteal veins.  - No evidence of superficial venous thrombosis.  - The deep venous system is competent.  - The great saphenous vein is not competent (distal thigh only).  - The small saphenous vein is competent.    [name] is a 80 y.o. female who presents with: leg swelling   Pt does not have evidence of DVT BLE.  She has easily palpable pedal pulses.  She does have venous reflux in the right CFV and GSV as well as the left GSV in the distal thigh only.  She is not a candidate for laser ablation. -discussed with pt about wearing knee high 15-51mmHg compression stockings.  We discussed putting them on in the morning and taking them off at night.   -discussed the importance of leg elevation and how to elevate.    -she was given a handout.    She has had some transient left arm numbness over the past 4 months.  I discussed with her that this could be caused by but not limited to her back issues, heart issues or possible TIA's.  She does have afib and she states she has not missed any Elilquis doses.  She states she has an appt with  Dr. Caryl Comes next month.  She states that she would like to talk with him about this.  I informed her that if she has any more of these episodes, she needs to go to the ER or contact her PCP or cardiologist.    Leontine Locket, [facility] Vascular [facility] Vein  Specialists 07/10/2020 2:14 PM  Clinic MD:  Trula Slade

## 2020-08-09 ENCOUNTER — Ambulatory Visit (INDEPENDENT_AMBULATORY_CARE_PROVIDER_SITE_OTHER): Payer: Medicare Other

## 2020-08-09 DIAGNOSIS — I442 Atrioventricular block, complete: Secondary | ICD-10-CM | POA: Diagnosis not present

## 2020-08-11 LAB — CUP PACEART REMOTE DEVICE CHECK
Battery Impedance: 1915 Ohm
Battery Remaining Longevity: 32 mo
Battery Voltage: 2.75 V
Brady Statistic AP VP Percent: 9 %
Brady Statistic AP VS Percent: 0 %
Brady Statistic AS VP Percent: 91 %
Brady Statistic AS VS Percent: 0 %
Date Time Interrogation Session: 20220210144933
Implantable Lead Implant Date: 20041110
Implantable Lead Implant Date: 20041110
Implantable Lead Location: 753859
Implantable Lead Location: 753860
Implantable Pulse Generator Implant Date: 20130318
Lead Channel Impedance Value: 403 Ohm
Lead Channel Impedance Value: 529 Ohm
Lead Channel Pacing Threshold Amplitude: 0.5 V
Lead Channel Pacing Threshold Amplitude: 0.625 V
Lead Channel Pacing Threshold Pulse Width: 0.4 ms
Lead Channel Pacing Threshold Pulse Width: 0.4 ms
Lead Channel Setting Pacing Amplitude: 2 V
Lead Channel Setting Pacing Amplitude: 2.5 V
Lead Channel Setting Pacing Pulse Width: 0.4 ms
Lead Channel Setting Sensing Sensitivity: 4 mV

## 2020-08-14 NOTE — Progress Notes (Signed)
Remote pacemaker transmission.   

## 2020-08-22 ENCOUNTER — Encounter: Payer: Medicare Other | Admitting: Internal Medicine

## 2020-08-22 DIAGNOSIS — Z950 Presence of cardiac pacemaker: Secondary | ICD-10-CM

## 2020-08-22 DIAGNOSIS — I4891 Unspecified atrial fibrillation: Secondary | ICD-10-CM

## 2020-08-22 DIAGNOSIS — I472 Ventricular tachycardia: Secondary | ICD-10-CM

## 2020-08-22 DIAGNOSIS — I442 Atrioventricular block, complete: Secondary | ICD-10-CM

## 2020-09-01 ENCOUNTER — Encounter: Payer: Medicare Other | Admitting: Internal Medicine

## 2020-09-13 ENCOUNTER — Ambulatory Visit (INDEPENDENT_AMBULATORY_CARE_PROVIDER_SITE_OTHER): Payer: Medicare Other | Admitting: Medical

## 2020-09-13 ENCOUNTER — Encounter (HOSPITAL_BASED_OUTPATIENT_CLINIC_OR_DEPARTMENT_OTHER): Payer: Self-pay

## 2020-09-13 ENCOUNTER — Emergency Department (HOSPITAL_BASED_OUTPATIENT_CLINIC_OR_DEPARTMENT_OTHER): Payer: Medicare Other

## 2020-09-13 ENCOUNTER — Encounter: Payer: Self-pay | Admitting: Medical

## 2020-09-13 ENCOUNTER — Inpatient Hospital Stay (HOSPITAL_BASED_OUTPATIENT_CLINIC_OR_DEPARTMENT_OTHER)
Admission: EM | Admit: 2020-09-13 | Discharge: 2020-09-17 | DRG: 392 | Disposition: A | Discharge status: Home / Self Care | Payer: Medicare Other | Attending: Internal Medicine | Admitting: Internal Medicine

## 2020-09-13 ENCOUNTER — Other Ambulatory Visit: Payer: Self-pay

## 2020-09-13 VITALS — BP 130/80 | HR 96 | Resp 18 | Wt 147.2 lb

## 2020-09-13 DIAGNOSIS — I429 Cardiomyopathy, unspecified: Secondary | ICD-10-CM | POA: Diagnosis present

## 2020-09-13 DIAGNOSIS — Z888 Allergy status to other drugs, medicaments and biological substances status: Secondary | ICD-10-CM

## 2020-09-13 DIAGNOSIS — Z79899 Other long term (current) drug therapy: Secondary | ICD-10-CM

## 2020-09-13 DIAGNOSIS — Z87891 Personal history of nicotine dependence: Secondary | ICD-10-CM | POA: Diagnosis not present

## 2020-09-13 DIAGNOSIS — Z20822 Contact with and (suspected) exposure to covid-19: Secondary | ICD-10-CM | POA: Diagnosis not present

## 2020-09-13 DIAGNOSIS — E7800 Pure hypercholesterolemia, unspecified: Secondary | ICD-10-CM | POA: Diagnosis not present

## 2020-09-13 DIAGNOSIS — I509 Heart failure, unspecified: Secondary | ICD-10-CM | POA: Diagnosis not present

## 2020-09-13 DIAGNOSIS — I10 Essential (primary) hypertension: Secondary | ICD-10-CM | POA: Diagnosis not present

## 2020-09-13 DIAGNOSIS — K449 Diaphragmatic hernia without obstruction or gangrene: Secondary | ICD-10-CM | POA: Diagnosis present

## 2020-09-13 DIAGNOSIS — I480 Paroxysmal atrial fibrillation: Secondary | ICD-10-CM | POA: Diagnosis not present

## 2020-09-13 DIAGNOSIS — Z7901 Long term (current) use of anticoagulants: Secondary | ICD-10-CM

## 2020-09-13 DIAGNOSIS — M810 Age-related osteoporosis without current pathological fracture: Secondary | ICD-10-CM | POA: Diagnosis not present

## 2020-09-13 DIAGNOSIS — Z885 Allergy status to narcotic agent status: Secondary | ICD-10-CM | POA: Diagnosis not present

## 2020-09-13 DIAGNOSIS — N179 Acute kidney failure, unspecified: Secondary | ICD-10-CM | POA: Diagnosis not present

## 2020-09-13 DIAGNOSIS — T502X5A Adverse effect of carbonic-anhydrase inhibitors, benzothiadiazides and other diuretics, initial encounter: Secondary | ICD-10-CM | POA: Diagnosis present

## 2020-09-13 DIAGNOSIS — G4733 Obstructive sleep apnea (adult) (pediatric): Secondary | ICD-10-CM | POA: Diagnosis present

## 2020-09-13 DIAGNOSIS — I442 Atrioventricular block, complete: Secondary | ICD-10-CM | POA: Diagnosis not present

## 2020-09-13 DIAGNOSIS — Z950 Presence of cardiac pacemaker: Secondary | ICD-10-CM | POA: Diagnosis not present

## 2020-09-13 DIAGNOSIS — M79602 Pain in left arm: Secondary | ICD-10-CM | POA: Diagnosis not present

## 2020-09-13 DIAGNOSIS — R072 Precordial pain: Secondary | ICD-10-CM | POA: Diagnosis present

## 2020-09-13 DIAGNOSIS — E785 Hyperlipidemia, unspecified: Secondary | ICD-10-CM | POA: Diagnosis present

## 2020-09-13 DIAGNOSIS — Z85528 Personal history of other malignant neoplasm of kidney: Secondary | ICD-10-CM

## 2020-09-13 DIAGNOSIS — R0981 Nasal congestion: Secondary | ICD-10-CM

## 2020-09-13 DIAGNOSIS — E876 Hypokalemia: Secondary | ICD-10-CM | POA: Diagnosis present

## 2020-09-13 DIAGNOSIS — E890 Postprocedural hypothyroidism: Secondary | ICD-10-CM | POA: Diagnosis not present

## 2020-09-13 DIAGNOSIS — K219 Gastro-esophageal reflux disease without esophagitis: Secondary | ICD-10-CM | POA: Diagnosis not present

## 2020-09-13 DIAGNOSIS — N1831 Chronic kidney disease, stage 3a: Secondary | ICD-10-CM | POA: Diagnosis not present

## 2020-09-13 DIAGNOSIS — R079 Chest pain, unspecified: Secondary | ICD-10-CM | POA: Diagnosis present

## 2020-09-13 DIAGNOSIS — Z905 Acquired absence of kidney: Secondary | ICD-10-CM

## 2020-09-13 DIAGNOSIS — Z823 Family history of stroke: Secondary | ICD-10-CM

## 2020-09-13 DIAGNOSIS — I130 Hypertensive heart and chronic kidney disease with heart failure and stage 1 through stage 4 chronic kidney disease, or unspecified chronic kidney disease: Secondary | ICD-10-CM | POA: Diagnosis not present

## 2020-09-13 DIAGNOSIS — R52 Pain, unspecified: Secondary | ICD-10-CM

## 2020-09-13 DIAGNOSIS — Z79890 Hormone replacement therapy (postmenopausal): Secondary | ICD-10-CM

## 2020-09-13 DIAGNOSIS — Z8249 Family history of ischemic heart disease and other diseases of the circulatory system: Secondary | ICD-10-CM | POA: Diagnosis not present

## 2020-09-13 HISTORY — DX: Other ventricular tachycardia: I47.29

## 2020-09-13 HISTORY — DX: Ventricular tachycardia: I47.2

## 2020-09-13 LAB — TROPONIN I (HIGH SENSITIVITY)
Troponin I (High Sensitivity): 12 ng/L (ref <18)
Troponin I (High Sensitivity): 14 ng/L (ref <18)

## 2020-09-13 LAB — CBC
HCT: 40.2 % (ref 36.0–46.0)
Hemoglobin: 13.1 g/dL (ref 12.0–15.0)
MCH: 27.6 pg (ref 26.0–34.0)
MCHC: 32.6 g/dL (ref 30.0–36.0)
MCV: 84.6 fL (ref 80.0–100.0)
Platelets: 147 10*3/uL — ABNORMAL LOW (ref 150–400)
RBC: 4.75 MIL/uL (ref 3.87–5.11)
RDW: 12.5 % (ref 11.5–15.5)
WBC: 5.4 10*3/uL (ref 4.0–10.5)
nRBC: 0 % (ref 0.0–0.2)

## 2020-09-13 LAB — BASIC METABOLIC PANEL
Anion gap: 11 (ref 5–15)
BUN: 18 mg/dL (ref 8–23)
CO2: 26 mmol/L (ref 22–32)
Calcium: 9.9 mg/dL (ref 8.9–10.3)
Chloride: 99 mmol/L (ref 98–111)
Creatinine, Ser: 1.01 mg/dL — ABNORMAL HIGH (ref 0.44–1.00)
GFR, Estimated: 56 mL/min — ABNORMAL LOW (ref >60)
Glucose, Bld: 99 mg/dL (ref 70–99)
Potassium: 3.4 mmol/L — ABNORMAL LOW (ref 3.5–5.1)
Sodium: 136 mmol/L (ref 135–145)

## 2020-09-13 LAB — SARS CORONAVIRUS 2 (TAT 6-24 HRS): SARS Coronavirus 2: NEGATIVE

## 2020-09-13 LAB — BRAIN NATRIURETIC PEPTIDE: B Natriuretic Peptide: 226.9 pg/mL — ABNORMAL HIGH (ref 0.0–100.0)

## 2020-09-13 LAB — SEDIMENTATION RATE: Sed Rate: 3 mm/hr (ref 0–22)

## 2020-09-13 MED ORDER — LEVOTHYROXINE SODIUM 100 MCG PO TABS
100.0000 ug | ORAL_TABLET | Freq: Every day | ORAL | Status: DC
  Administered 2020-09-14 – 2020-09-17 (×3): 100 ug via ORAL
  Filled 2020-09-13 (×4): qty 1

## 2020-09-13 MED ORDER — METOPROLOL TARTRATE 25 MG PO TABS
25.0000 mg | ORAL_TABLET | Freq: Two times a day (BID) | ORAL | Status: DC
  Administered 2020-09-13 – 2020-09-17 (×8): 25 mg via ORAL
  Filled 2020-09-13 (×8): qty 1

## 2020-09-13 MED ORDER — APIXABAN 5 MG PO TABS
5.0000 mg | ORAL_TABLET | Freq: Two times a day (BID) | ORAL | Status: DC
  Administered 2020-09-13 – 2020-09-17 (×8): 5 mg via ORAL
  Filled 2020-09-13 (×8): qty 1

## 2020-09-13 MED ORDER — DIPHENHYDRAMINE HCL 25 MG PO CAPS
25.0000 mg | ORAL_CAPSULE | Freq: Once | ORAL | Status: AC
  Administered 2020-09-13: 25 mg via ORAL
  Filled 2020-09-13: qty 1

## 2020-09-13 MED ORDER — ASPIRIN 81 MG PO CHEW
324.0000 mg | CHEWABLE_TABLET | Freq: Once | ORAL | Status: AC
  Administered 2020-09-13: 324 mg via ORAL
  Filled 2020-09-13: qty 4

## 2020-09-13 MED ORDER — ONDANSETRON HCL 4 MG/2ML IJ SOLN: 4.0000 mg | Freq: Four times a day (QID) | INTRAMUSCULAR | Status: DC | PRN

## 2020-09-13 MED ORDER — LISINOPRIL 40 MG PO TABS
40.0000 mg | ORAL_TABLET | Freq: Every day | ORAL | Status: DC
  Administered 2020-09-14 – 2020-09-17 (×4): 40 mg via ORAL
  Filled 2020-09-13 (×4): qty 1

## 2020-09-13 MED ORDER — POTASSIUM CHLORIDE 20 MEQ PO PACK
40.0000 meq | PACK | Freq: Once | ORAL | Status: AC
  Administered 2020-09-13: 40 meq via ORAL
  Filled 2020-09-13: qty 2

## 2020-09-13 MED ORDER — NITROGLYCERIN 0.4 MG SL SUBL: 0.4000 mg | SUBLINGUAL_TABLET | SUBLINGUAL | Status: DC | PRN

## 2020-09-13 MED ORDER — ACETAMINOPHEN 325 MG PO TABS
650.0000 mg | ORAL_TABLET | ORAL | Status: DC | PRN
  Administered 2020-09-13 – 2020-09-16 (×7): 650 mg via ORAL
  Filled 2020-09-13 (×7): qty 2

## 2020-09-13 MED ORDER — ALUM & MAG HYDROXIDE-SIMETH 200-200-20 MG/5ML PO SUSP
30.0000 mL | Freq: Once | ORAL | Status: AC
  Administered 2020-09-13: 30 mL via ORAL
  Filled 2020-09-13 (×2): qty 30

## 2020-09-13 MED ORDER — LIDOCAINE VISCOUS HCL 2 % MT SOLN
15.0000 mL | Freq: Once | OROMUCOSAL | Status: AC
  Administered 2020-09-13: 15 mL via ORAL
  Filled 2020-09-13: qty 15

## 2020-09-13 NOTE — ED Notes (Signed)
Lab called and stated that the ESR could not be run and needs recollect.  Pt has already transferred to [facility].  Labs resent to admission hospital.

## 2020-09-13 NOTE — H&P (Signed)
History and Physical    GLADA WICKSTROM GUR:[id number] DOB: [birth year] DOA: 09/13/2020  PCP: [name], DO  Patient coming from: [place] have personally briefly reviewed patient's old medical records in [place]  Chief Complaint: Chest discomfort  HPI: MARIELL [name] is a 80 y.o. female with medical history significant for paroxysmal atrial fibrillation on Eliquis, CHB s/p PPM, HTN, HLD, hypothyroidism who presented to [place] ED for evaluation of chest discomfort.  Patient reports new onset of sternal and left chest discomfort beginning on 09/10/2020.  Discomfort is described as a dull ache with radiation to her left shoulder and down her left arm, up her left neck, and towards her back.  Symptoms are intermittent but occur even at rest.  She has also had significant acid reflux/heartburn.  She says her symptoms feel like they improve when she belches.  On 09/11/2020 she developed frequent loose stools without significant watery diarrhea or bloody stools.  Loose stools are beginning to improve.  She notices becoming short of breath when she walks up a flight of stairs in her home which is new.  She reports a chronic cough in the morning due to postnasal drip which is unchanged.  She denies any subjective fevers, chills, diaphoresis, nausea, vomiting, palpitations, dysuria, or peripheral edema.  [name] Course:  Initial vitals showed BP 160/104, pulse 86, RR 18, temp 98.3 F, SPO2 100% on room air.  Labs show sodium 136, potassium 3.4, bicarb 26, BUN 18, creatinine 1.01, serum glucose 99, WBC 5.4, hemoglobin 13.1, platelets 147,000, BNP 226.9, high-sensitivity troponin I 12 > 14.  SARS-CoV-2 PCR was collected and pending.  2 view chest x-ray showed left-sided pacemaker in place without focal consolidation, edema, or effusion.  Patient was felt to have a heart score of 5.  The hospitalist service was consulted to admit for  chest pain rule out.  Patient was given aspirin 324 mg once and started on Lopressor 25 mg p.o. twice daily prior to transfer to [facility].  Review of Systems: All systems reviewed and are negative except as documented in history of present illness above.   Past Medical History:  Diagnosis Date  . Atrioventricular block, complete ([place])    a. 08/2011 Upgrage of PPM to MDT Adapta L Dual Chamber PPM ser # [id number].  . Cardiac pacemaker in situ   . Contact dermatitis and eczema    unspec cause  . Dairy product intolerance   . Depression   . Diastolic dysfunction   . GERD (gastroesophageal reflux disease)   . Hiatal hernia   . History of CHF (congestive heart failure)   . History of diverticulitis of colon    2003  perforated diverticulitis w/ surgical intervention  . History of kidney stones    age 69  . History of peripheral edema    lower extremities  . History of thyroid nodule    multinodular goiter  s/p  total thyroidectomy 2014  . HTN (hypertension)   . Hydronephrosis, left   . Hypothyroidism   . Mild sleep apnea    per study 04-14-2014  . Nocturia   . Osteoporosis   . PAF (paroxysmal atrial fibrillation) ([place])   . PONV (postoperative nausea and vomiting)   . Pulmonary nodules    stable per last ct  . Renal cell carcinoma of left kidney (HCC)   . Shortness of breath    intermittant  .  Superficial thrombophlebitis     Past Surgical History:  Procedure Laterality Date  . CARDIOVASCULAR STRESS TEST  04-20-2015  dr Caryl Comes   normal nuclear study/  no ischemia/  normal LV function and wall motion, ef 74%  . COLONOSCOPY  01/19/2013  . COLOSTOMY TAKEDOWN  07-22-2002  . CYSTOSCOPY WITH RETROGRADE PYELOGRAM, URETEROSCOPY AND STENT PLACEMENT Left 07/28/2015   Procedure: CYSTOSCOPY WITH LEFT RETROGRADE PYELOGRAM, POSSIBLE LEFT URETEROSCOPY AND STENT PLACEMENT;  Surgeon: Franchot Gallo, MD;  [facility];  Service: Urology;  Laterality: Left;   . EXPLORATORY LAPARTOMY SIGMOID COLECTOMY/ COLOSTOMY  04-28-2002   perforated diverticulitis  . KYPHOPLASTY    . LAPAROSCOPY VENTRAL HERNIA REPAIR/  EXTENSIVE LYSIS ADHESIONS  05-04-2004  . PACEMAKER GENERATOR CHANGE Left 09/16/2011   MDT [place] pacemaker  . PACEMAKER PLACEMENT  05-11-2003     medtronic  . ROBOT ASSISTED LAPAROSCOPIC NEPHRECTOMY Left 03/20/2016   Procedure: XI ROBOTIC ASSISTED LAPAROSCOPIC RETROPERITONEAL NEPHRECTOMY;  Surgeon: Alexis Frock, MD;  Location: [facility];  Service: Urology;  Laterality: Left;  . THYROIDECTOMY N/A 08/07/2012   Procedure: THYROIDECTOMY;  Surgeon: Earnstine Regal, MD;  Location: [facility];  Service: General;  Laterality: N/A;  . TRANSTHORACIC ECHOCARDIOGRAM  10-12-2014   moderate focal basal LVH,  ef 55-60%,  grade 2 diastolic dysfunction, mild AV calcification without stenosis,  mild MR,  trivial PR    Social History:  reports that she quit smoking about 40 years ago. Her smoking use included cigarettes. She quit after 15.00 years of use. She has never used smokeless tobacco. She reports that she does not drink alcohol and does not use drugs.  Allergies  Allergen Reactions  . Tape Rash  . Codeine Nausea And Vomiting    Family History  Problem Relation Age of Onset  . Heart disease Mother   . Stroke Mother   . Hypertension Mother   . Heart failure Father   . Hypertension Brother   . Heart disease Brother   . Diverticulosis Sister   . Hypertension Brother   . Cancer Son        breast  . Heart attack Neg Hx      Prior to Admission medications   Medication Sig Start Date End Date Taking? Authorizing Provider  acetaminophen (TYLENOL) 500 MG tablet Take 1,000 mg by mouth daily as needed for moderate pain.    [provider]  amLODipine (NORVASC) 5 MG tablet Take 1 tablet (5 mg total) by mouth daily. 06/08/20   [name], DO  apixaban (ELIQUIS) 5 MG TABS tablet Take 1 tablet (5 mg total) by mouth 2 (two) times daily.  06/28/20   Deboraha Sprang, MD  Chlorphen-Phenyleph-APAP (CORICIDIN D COLD/FLU/SINUS) 2-5-325 MG TABS Take 2 tablets by mouth daily.    [provider]  denosumab (PROLIA) 60 MG/ML SOSY injection Inject 60 mg into the skin every 6 (six) months.    [provider]  diclofenac Sodium (VOLTAREN) 1 % GEL Apply 4 g topically 4 (four) times daily. 04/03/20   Roma Schanz R, DO  diphenhydramine-acetaminophen (TYLENOL PM) 25-500 MG TABS tablet Take 2 tablets by mouth at bedtime as needed (sleep/pain).    [provider]  fluticasone (FLONASE) 50 MCG/ACT nasal spray PLACE 2 SPRAYS INTO BOTH NOSTRILS DAILY AS NEEDED FOR ALLERGIES OR RHINITIS. 02/10/20   Carollee Herter, Alferd Apa, DO  hydrochlorothiazide (HYDRODIURIL) 12.5 MG tablet TAKE 1 TABLET BY MOUTH ONCE DAILY 07/10/20   [name],  DO  levocetirizine (XYZAL) 5 MG tablet Take 1 tablet (5 mg total) by mouth every evening. 05/19/20   Roma Schanz R, DO  levothyroxine (SYNTHROID) 100 MCG tablet TAKE 1 TABLET BY MOUTH ONCE DAILY BEFORE BREAKFAST . 12/23/19   Carollee Herter, Yvonne R, DO  lisinopril (ZESTRIL) 40 MG tablet TAKE 1 TABLET (40 MG TOTAL) BY MOUTH DAILY. 07/10/20   [name], DO  loratadine (CLARITIN) 10 MG tablet Take 1 tablet (10 mg total) by mouth daily as needed for allergies. 04/03/20   [name], DO  tiZANidine (ZANAFLEX) 4 MG capsule Take 1 capsule (4 mg total) by mouth 3 (three) times daily as needed for muscle spasms. Patient not taking: Reported on 07/10/2020 04/03/20   [name], DO    Physical Exam: Vitals:   09/13/20 1500 09/13/20 1600 09/13/20 1700 09/13/20 1747  BP: (!) 159/83 (!) 162/64  (!) 163/67  Pulse: 80 78  63  Resp: 18 18  16   Temp:    97.9 F (36.6 C)  TempSrc:    Oral  SpO2: 98% 100%  100%  Weight:   66.2 kg   Height:   5\' 3"  (1.6 m)    Constitutional: Resting in bed with head elevated, NAD, calm, comfortable Eyes: PERRL, lids and  conjunctivae normal ENMT: Mucous membranes are moist. Posterior pharynx clear of any exudate or lesions.Normal dentition.  Neck: normal, supple, no masses. Respiratory: clear to auscultation bilaterally, no wheezing, no crackles. Normal respiratory effort. No accessory muscle use.  Cardiovascular:  Paced rhythm, no murmurs / rubs / gallops. No extremity edema. 2+ pedal pulses. Abdomen: no tenderness, no masses palpated. Bowel sounds positive.  Musculoskeletal: no clubbing / cyanosis. No joint deformity upper and lower extremities. Good ROM, no contractures. Normal muscle tone.  Skin: no rashes, lesions, ulcers. No induration Neurologic: CN 2-12 grossly intact. Sensation intact. Strength 5/5 in all 4.  Psychiatric: Normal judgment and insight. Alert and oriented x 3. Normal mood.   Labs on Admission: I have personally reviewed following labs and imaging studies  CBC: Recent Labs  Lab 09/13/20 1208  WBC 5.4  HGB 13.1  HCT 40.2  MCV 84.6  PLT 578*   Basic Metabolic Panel: Recent Labs  Lab 09/13/20 1208  NA 136  K 3.4*  CL 99  CO2 26  GLUCOSE 99  BUN 18  CREATININE 1.01*  CALCIUM 9.9   GFR: Estimated Creatinine Clearance: 39.3 mL/min (A) (by C-G formula based on SCr of 1.01 mg/dL (H)). Liver Function Tests: No results for input(s): AST, ALT, ALKPHOS, BILITOT, PROT, ALBUMIN in the last 168 hours. No results for input(s): LIPASE, AMYLASE in the last 168 hours. No results for input(s): AMMONIA in the last 168 hours. Coagulation Profile: No results for input(s): INR, PROTIME in the last 168 hours. Cardiac Enzymes: No results for input(s): CKTOTAL, CKMB, CKMBINDEX, TROPONINI in the last 168 hours. BNP (last 3 results) No results for input(s): PROBNP in the last 8760 hours. HbA1C: No results for input(s): HGBA1C in the last 72 hours. CBG: No results for input(s): GLUCAP in the last 168 hours. Lipid Profile: No results for input(s): CHOL, HDL, LDLCALC, TRIG, CHOLHDL,  LDLDIRECT in the last 72 hours. Thyroid Function Tests: No results for input(s): TSH, T4TOTAL, FREET4, T3FREE, THYROIDAB in the last 72 hours. Anemia Panel: No results for input(s): VITAMINB12, FOLATE, FERRITIN, TIBC, IRON, RETICCTPCT in the last 72 hours. Urine analysis:    Component Value Date/Time  COLORURINE YELLOW 10/14/2013 [place] 10/14/2013 2241   LABSPEC 1.024 10/14/2013 2241   PHURINE 6.5 10/14/2013 2241   GLUCOSEU NEGATIVE 10/14/2013 [place] 10/03/2006 0743   HGBUR NEGATIVE 10/14/2013 2241   BILIRUBINUR neg 06/07/2016 1212   KETONESUR NEGATIVE 10/14/2013 2241   PROTEINUR neg 06/07/2016 1212   PROTEINUR NEGATIVE 10/14/2013 2241   UROBILINOGEN 0.2 06/07/2016 1212   UROBILINOGEN 0.2 10/14/2013 2241   NITRITE neg 06/07/2016 1212   NITRITE NEGATIVE 10/14/2013 2241   LEUKOCYTESUR Negative 06/07/2016 1212    Radiological Exams on Admission: DG Chest 2 View  Result Date: 09/13/2020 CLINICAL DATA:  Chest pain. Additional history provided: Chest pain and left arm pain since Sunday, worsening last night, dizziness when standing. EXAM: CHEST - 2 VIEW COMPARISON:  CT chest 03/16/2018.  Chest radiographs 07/30/2016. FINDINGS: Redemonstrated left chest dual lead pacer device. Heart size within normal limits. Aortic atherosclerosis. No appreciable airspace consolidation. No evidence of pleural effusion or pneumothorax. No acute bony abnormality is identified. Surgical clips within the lower neck. Multiple previously augmented vertebral compression fractures within the thoracic and lumbar spine. IMPRESSION: No evidence of acute cardiopulmonary abnormality. Aortic Atherosclerosis (ICD10-I70.0). Electronically Signed   By: Kellie Simmering DO   On: 09/13/2020 12:48    EKG: Personally reviewed. Sinus paced rhythm, similar to prior.  Assessment/Plan Principal Problem:   Chest pain Active Problems:   Essential hypertension   AV BLOCK, COMPLETE    Hypothyroidism, postsurgical   Paroxysmal atrial fibrillation (HCC)   [name] is a 80 y.o. female with medical history significant for paroxysmal atrial fibrillation on Eliquis, CHB s/p PPM, HTN, HLD, hypothyroidism who is admitted for evaluation of chest discomfort.  Chest discomfort/dyspnea on exertion: Patient with mixed symptoms, no prior ischemic work-up in our system.  High-sensitivity troponin are negative x2.  EKG without acute ischemic changes.  Chest x-ray reassuring.  Low risk for PE by [place] and modified Geneva score.  She reports adherence to Eliquis. -Monitor on telemetry -Update echocardiogram -Has been started on Lopressor 25 mg p.o. twice daily, will continue -Trial GI cocktail  Paroxysmal atrial fibrillation CHB s/p PPM: Chronic and stable.  Continue Eliquis.  Started on Lopressor as above.  Hypertension: Continue Lopressor, resume home lisinopril.  Holding amlodipine and HCTZ for now.  Hypokalemia: Supplement potassium, holding HCTZ.  Hypothyroidism: Continue Synthroid.  DVT prophylaxis: Eliquis Code Status: Full code, confirmed with patient Family Communication: Discussed with patient's daughter at bedside Disposition Plan: From home and likely discharge to home pending chest pain rule out Consults called: None Level of care: Telemetry Medical Admission status:  Status is: Observation  The patient remains OBS appropriate and will d/c before 2 midnights.  Dispo: The patient is from: Home              Anticipated d/c is to: Home              Patient currently is not medically stable to d/c.   Difficult to place patient No  Zada Finders MD [facility]  If 7PM-7AM, please contact night-coverage [email or web address]  09/13/2020, 7:13 PM

## 2020-09-13 NOTE — ED Provider Notes (Signed)
[facility] DEPARTMENT Provider Note   CSN: [id number] Arrival date & time: 09/13/20  1148     History Chief Complaint  Patient presents with  . Chest Pain    [name] is a 80 y.o. female.  HPI  HPI: A 80 year old patient with a history of hypertension and hypercholesterolemia presents for evaluation of chest pain. Initial onset of pain was more than 6 hours ago. The patient's chest pain is not worse with exertion. The patient's chest pain is middle- or left-sided, is not well-localized, is not described as heaviness/pressure/tightness, is not sharp and does radiate to the arms/jaw/neck. The patient does not complain of nausea and denies diaphoresis. The patient has no history of stroke, has no history of peripheral artery disease, has not smoked in the past 90 days, denies any history of treated diabetes, has no relevant family history of coronary artery disease (first degree relative at less than age 60) and does not have an elevated BMI (>=30).    [name] is a 80 y.o. female, with a history of cardiac pacemaker, GERD, CHF, HTN, paroxysmal atrial fibrillation, presenting to the ED with chest discomfort beginning March 13.  She was sent to the ED from her PCPs office.  She describes the discomfort as an aching in the left chest, mild, does not worsen with position or exertion.  She is also noted aching in the left shoulder and bilateral throat. She notes feelings of discomfort in the epigastric region, "like acid reflux," along with nausea and loose stools.  Nausea and loose stools resolved yesterday. She has noted some lightheadedness with standing, but this quickly resolves. When asked about shortness of breath, she states, "not really, but when I walk up the steps in my house I notice I am breathing a little harder than normal once I get to the top." She also notes intermittent left-sided headache. Denies fever/chills, cough, shortness of breath at  rest, lower extremity edema/pain, orthopnea, syncope, room spinning dizziness, hematochezia/melena, urinary symptoms, vomiting, or any other complaints  Past Medical History:  Diagnosis Date  . Atrioventricular block, complete ([place])    a. 08/2011 Upgrage of PPM to MDT Adapta L Dual Chamber PPM ser # [id number].  . Cardiac pacemaker in situ   . Contact dermatitis and eczema    unspec cause  . Dairy product intolerance   . Depression   . Diastolic dysfunction   . GERD (gastroesophageal reflux disease)   . Hiatal hernia   . History of CHF (congestive heart failure)   . History of diverticulitis of colon    2003  perforated diverticulitis w/ surgical intervention  . History of kidney stones    age 49  . History of peripheral edema    lower extremities  . History of thyroid nodule    multinodular goiter  s/p  total thyroidectomy 2014  . HTN (hypertension)   . Hydronephrosis, left   . Hypothyroidism   . Mild sleep apnea    per study 04-14-2014  . Nocturia   . Osteoporosis   . PAF (paroxysmal atrial fibrillation) ([place])   . PONV (postoperative nausea and vomiting)   . Pulmonary nodules    stable per last ct  . Renal cell carcinoma of left kidney (HCC)   . Shortness of breath    intermittant  . Superficial thrombophlebitis     Patient Active Problem List   Diagnosis Date Noted  . Chest pain 09/13/2020  . Spider veins 04/05/2020  .  Varicose veins of bilateral lower extremities with other complications 84/69/6295  . H/O seasonal allergies 04/05/2020  . Numbness and tingling of both feet 12/23/2019  . A-fib ([place]) 07/22/2019  . V-tach ([place]) 07/22/2019  . Lipoma of left upper extremity 03/11/2019  . Hordeolum externum of left upper eyelid 02/15/2019  . Hyponatremia 02/15/2019  . Hyperlipidemia 06/03/2017  . Renal mass 03/20/2016  . Fecal impaction ([place]) 10/16/2013  . Vertebral compression fracture ([place]) 10/16/2013  . Constipation 10/15/2013  . Abdominal pain 10/15/2013  .  PUD (peptic ulcer disease) 10/15/2013  . Low back pain with radiation 10/15/2013  . 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 Pacemaker-Medtronic 02/01/2011  . AV BLOCK, COMPLETE 11/23/2009  . LIPOMAS, MULTIPLE 11/30/2008  . GANGLION OF TENDON SHEATH 11/30/2008  . SUPERFICIAL THROMBOPHLEBITIS 10/10/2008  . LUNG NODULE 10/10/2008  . HEADACHE, TENSION 05/02/2008  . GOITER, MULTINODULAR 01/19/2008  . DIASTOLIC DYSFUNCTION 28/41/3244  . DEPRESSION 01/23/2007  . Essential hypertension 01/23/2007  . GERD 01/23/2007  . Osteoporosis 01/23/2007  . Personal history of other endocrine, metabolic, and immunity disorders 01/23/2007    Past Surgical History:  Procedure Laterality Date  . CARDIOVASCULAR STRESS TEST  04-20-2015  dr Caryl Comes   normal nuclear study/  no ischemia/  normal LV function and wall motion, ef 74%  . COLONOSCOPY  01/19/2013  . COLOSTOMY TAKEDOWN  07-22-2002  . CYSTOSCOPY WITH RETROGRADE PYELOGRAM, URETEROSCOPY AND STENT PLACEMENT Left 07/28/2015   Procedure: CYSTOSCOPY WITH LEFT RETROGRADE PYELOGRAM, POSSIBLE LEFT URETEROSCOPY AND STENT PLACEMENT;  Surgeon: Franchot Gallo, MD;  Location: [facility];  Service: Urology;  Laterality: Left;  . EXPLORATORY LAPARTOMY SIGMOID COLECTOMY/ COLOSTOMY  04-28-2002   perforated diverticulitis  . KYPHOPLASTY    . LAPAROSCOPY VENTRAL HERNIA REPAIR/  EXTENSIVE LYSIS ADHESIONS  05-04-2004  . PACEMAKER GENERATOR CHANGE Left 09/16/2011   MDT Lovilia pacemaker  . PACEMAKER PLACEMENT  05-11-2003     medtronic  . ROBOT ASSISTED LAPAROSCOPIC NEPHRECTOMY Left 03/20/2016   Procedure: XI ROBOTIC ASSISTED LAPAROSCOPIC RETROPERITONEAL NEPHRECTOMY;  Surgeon: Alexis Frock, MD;  Location: [facility];  Service: Urology;  Laterality: Left;  . THYROIDECTOMY N/A 08/07/2012   Procedure: THYROIDECTOMY;  Surgeon: Earnstine Regal, MD;  Location: [facility];  Service: General;  Laterality: N/A;   . TRANSTHORACIC ECHOCARDIOGRAM  10-12-2014   moderate focal basal LVH,  ef 55-60%,  grade 2 diastolic dysfunction, mild AV calcification without stenosis,  mild MR,  trivial PR     OB History   No obstetric history on file.     Family History  Problem Relation Age of Onset  . Heart disease Mother   . Stroke Mother   . Hypertension Mother   . Heart failure Father   . Hypertension Brother   . Heart disease Brother   . Diverticulosis Sister   . Hypertension Brother   . Cancer Son        breast  . Heart attack Neg Hx     Social History   Tobacco Use  . Smoking status: Former Smoker    Years: 15.00    Types: Cigarettes    Quit date: 10/06/1979    Years since quitting: 40.9  . Smokeless tobacco: Never Used  Substance Use Topics  . Alcohol use: No  . Drug use: No    Home Medications Prior to Admission medications   Medication Sig Start Date End Date Taking? Authorizing Provider  acetaminophen (TYLENOL) 500 MG tablet Take 1,000  mg by mouth daily as needed for moderate pain.    [provider]  amLODipine (NORVASC) 5 MG tablet Take 1 tablet (5 mg total) by mouth daily. 06/08/20   [name], DO  apixaban (ELIQUIS) 5 MG TABS tablet Take 1 tablet (5 mg total) by mouth 2 (two) times daily. 06/28/20   Deboraha Sprang, MD  Chlorphen-Phenyleph-APAP (CORICIDIN D COLD/FLU/SINUS) 2-5-325 MG TABS Take 2 tablets by mouth daily.    [provider]  denosumab (PROLIA) 60 MG/ML SOSY injection Inject 60 mg into the skin every 6 (six) months.    [provider]  diclofenac Sodium (VOLTAREN) 1 % GEL Apply 4 g topically 4 (four) times daily. 04/03/20   Roma Schanz R, DO  diphenhydramine-acetaminophen (TYLENOL PM) 25-500 MG TABS tablet Take 2 tablets by mouth at bedtime as needed (sleep/pain).    [provider]  fluticasone (FLONASE) 50 MCG/ACT nasal spray PLACE 2 SPRAYS INTO BOTH NOSTRILS DAILY AS NEEDED FOR ALLERGIES OR RHINITIS. 02/10/20    Carollee Herter, Alferd Apa, DO  hydrochlorothiazide (HYDRODIURIL) 12.5 MG tablet TAKE 1 TABLET BY MOUTH ONCE DAILY 07/10/20   Carollee Herter, Alferd Apa, DO  levocetirizine (XYZAL) 5 MG tablet Take 1 tablet (5 mg total) by mouth every evening. 05/19/20   Roma Schanz R, DO  levothyroxine (SYNTHROID) 100 MCG tablet TAKE 1 TABLET BY MOUTH ONCE DAILY BEFORE BREAKFAST . 12/23/19   Carollee Herter, Yvonne R, DO  lisinopril (ZESTRIL) 40 MG tablet TAKE 1 TABLET (40 MG TOTAL) BY MOUTH DAILY. 07/10/20   [name], DO  loratadine (CLARITIN) 10 MG tablet Take 1 tablet (10 mg total) by mouth daily as needed for allergies. 04/03/20   [name], DO  tiZANidine (ZANAFLEX) 4 MG capsule Take 1 capsule (4 mg total) by mouth 3 (three) times daily as needed for muscle spasms. Patient not taking: Reported on 07/10/2020 04/03/20   Roma Schanz R, DO    Allergies    Tape and Codeine  Review of Systems   Review of Systems  Constitutional: Negative for chills, diaphoresis and fever.  Eyes: Negative for visual disturbance.  Respiratory: Negative for cough and shortness of breath.   Cardiovascular: Positive for chest pain. Negative for leg swelling.  Gastrointestinal: Positive for abdominal pain, diarrhea and nausea. Negative for vomiting.  Genitourinary: Negative for dysuria.  Musculoskeletal: Negative for back pain.  Neurological: Negative for syncope, weakness and numbness.  All other systems reviewed and are negative.   Physical Exam Updated Vital Signs BP (!) 147/76 (BP Location: Right Arm)   Pulse 77   Temp 98.3 F (36.8 C) (Oral)   Resp 20   Ht 5\' 3"  (1.6 m)   Wt 66.7 kg   SpO2 97%   BMI 26.04 kg/m   Physical Exam Vitals and nursing note reviewed.  Constitutional:      General: She is not in acute distress.    Appearance: She is well-developed. She is not diaphoretic.  HENT:     Head: Normocephalic and atraumatic.     Comments: No tenderness to the head, specifically no  tenderness in the temporal region. No change in symptoms or onset of new symptoms with range of motion of the neck.    Mouth/Throat:     Mouth: Mucous membranes are moist.     Pharynx: Oropharynx is clear.  Eyes:     Conjunctiva/sclera: Conjunctivae normal.  Cardiovascular:     Rate and Rhythm: Normal  rate and regular rhythm.     Pulses: Normal pulses.          Radial pulses are 2+ on the right side and 2+ on the left side.       Posterior tibial pulses are 2+ on the right side and 2+ on the left side.     Heart sounds: Normal heart sounds.     Comments: Tactile temperature in the extremities appropriate and equal bilaterally. Pulmonary:     Effort: Pulmonary effort is normal. No respiratory distress.     Breath sounds: Normal breath sounds.  Abdominal:     Palpations: Abdomen is soft.     Tenderness: There is no abdominal tenderness. There is no guarding.  Musculoskeletal:     Cervical back: Neck supple.     Right lower leg: No edema.     Left lower leg: No edema.  Lymphadenopathy:     Cervical: No cervical adenopathy.  Skin:    General: Skin is warm and dry.  Neurological:     Mental Status: She is alert.     Comments: No noted acute cognitive deficit. Sensation grossly intact to light touch in the extremities.   Grip strengths equal bilaterally.   Strength 5/5 in all extremities.  Coordination intact.  Cranial nerves III-XII grossly intact.  Handles oral secretions without noted difficulty.  No noted phonation or speech deficit. No facial droop.   Psychiatric:        Mood and Affect: Mood and affect normal.        Speech: Speech normal.        Behavior: Behavior normal.     ED Results / Procedures / Treatments   Labs (all labs ordered are listed, but only abnormal results are displayed) Labs Reviewed  BASIC METABOLIC PANEL - Abnormal; Notable for the following components:      Result Value   Potassium 3.4 (*)    Creatinine, Ser 1.01 (*)    GFR, Estimated 56 (*)     All other components within normal limits  CBC - Abnormal; Notable for the following components:   Platelets 147 (*)    All other components within normal limits  SARS CORONAVIRUS 2 (TAT 6-24 HRS)  BRAIN NATRIURETIC PEPTIDE  SEDIMENTATION RATE  TROPONIN I (HIGH SENSITIVITY)  TROPONIN I (HIGH SENSITIVITY)    EKG EKG Interpretation  Date/Time:  Wednesday September 13 2020 12:01:03 EDT Ventricular Rate:  85 PR Interval:    QRS Duration: 152 QT Interval:  408 QTC Calculation: 485 R Axis:   -51 Text Interpretation: Sinus rhythm with Premature atrial complexes Left axis deviation Right bundle branch block Left ventricular hypertrophy with repolarization abnormality ( R in aVL , Romhilt-Estes ) Cannot rule out Anterior infarct , age undetermined Abnormal ECG No significant change since last tracing Confirmed by Gareth Morgan [phone]) on 09/13/2020 12:40:33 PM   Radiology DG Chest 2 View  Result Date: 09/13/2020 CLINICAL DATA:  Chest pain. Additional history provided: Chest pain and left arm pain since Sunday, worsening last night, dizziness when standing. EXAM: CHEST - 2 VIEW COMPARISON:  CT chest 03/16/2018.  Chest radiographs 07/30/2016. FINDINGS: Redemonstrated left chest dual lead pacer device. Heart size within normal limits. Aortic atherosclerosis. No appreciable airspace consolidation. No evidence of pleural effusion or pneumothorax. No acute bony abnormality is identified. Surgical clips within the lower neck. Multiple previously augmented vertebral compression fractures within the thoracic and lumbar spine. IMPRESSION: No evidence of acute cardiopulmonary abnormality. Aortic Atherosclerosis (ICD10-I70.0). Electronically Signed  By: Kellie Simmering DO   On: 09/13/2020 12:48    Procedures Procedures   Medications Ordered in ED Medications  metoprolol tartrate (LOPRESSOR) tablet 25 mg (0 mg Oral Hold 09/13/20 1558)  aspirin chewable tablet 324 mg (324 mg Oral Given 09/13/20 1604)     ED Course  I have reviewed the triage vital signs and the nursing notes.  Pertinent labs & imaging results that were available during my care of the patient were reviewed by me and considered in my medical decision making (see chart for details).  Clinical Course as of 09/13/20 1606  Wed Sep 13, 2020  1540 Spoke with Dr. Verlon Au, hospitalist.  Requests we administer aspirin and beta-blocker.  Add BNP to lab testing.  He will admit the patient to [facility] for chest pain observation. [SJ]    Clinical Course User Index [SJ] Mieko Kneebone C, PA-C   MDM Rules/Calculators/A&P HEAR Score: 5                        Patient presents with complaint of chest pain for the last several days. Her pain is atypical sounding, however, it radiates to the neck and left arm, accompanied by shortness of breath with exertion, and she has experienced lightheadedness along with the discomfort.  Upon consideration of the patient's symptoms and her HEART score, patient would benefit from chest pain observation admission.  EKG without evidence of acute ischemia or pathologic/symptomatic arrhythmia. Wells criteria score is 0, indicating low risk for PE.  Additionally, patient is on Eliquis. Dissection was considered, but thought less likely base on: History and description of the pain are not suggestive, patient is not ill-appearing, lack of risk factors, equal bilateral pulses, lack of neurologic deficits, no widened mediastinum on chest x-ray.  I reviewed and interpreted the patient's labs and radiological studies.   Findings and plan of care discussed with attending physician, Gareth Morgan.   Final Clinical Impression(s) / ED Diagnoses Final diagnoses:  Left-sided chest pain    Rx / DC Orders ED Discharge Orders    None       Layla Maw 09/13/20 1608    Gareth Morgan, MD 09/15/20 [phone]

## 2020-09-13 NOTE — ED Triage Notes (Signed)
Pt arrives to ED from PCP where she was seen for CP and left arm pain since Sunday, worsening last night. Pt does have Medtronic pacemaker.

## 2020-09-13 NOTE — Progress Notes (Signed)
Subjective:    Patient ID: [name], female    DOB: [birth year], 80 y.o.   MRN: [id number]  HPI  Pt in for recent blood pressure elevation. Pt states yesterday bp was 157/98 left arm. Also 155/92 rt arm.  Pt is on amlodipine 5 mg daily, lisinopril 40 mg daily and hctz 12.5 mg daily.  Pt also states some off and on left hand pain in past. But recenlty she has with some arm pain. She states constant ache in her arm and left shoulder area pain. Pt in past had hand area pain. Pain in left shoulder and arm woke her up last night. This pain is still present and not with movement and not reproducible.  Mild high cholesterol level in past.no hx of smoking. No diabetes. Dad chf. Mom did have stent placed.  Pt in shoulder more of ache. Also some left trap/scapula area.   Pt states she had some nasal congestion recently. Off and on temporal area pain for past 2 days. Some pain toward eye. No vision changes.  Left temporal pain with pain minimal pain toward eye last night.    Review of Systems  Constitutional: Negative for chills, fatigue and fever.  HENT: Negative for dental problem.   Respiratory: Negative for cough, choking, chest tightness, shortness of breath and wheezing.   Cardiovascular: Negative for chest pain and palpitations.  Gastrointestinal: Positive for abdominal pain.       Recent belches a lot.  Musculoskeletal: Negative for back pain.  Skin: Negative for rash.  Neurological: Negative for dizziness, seizures, weakness and headaches.  Hematological: Negative for adenopathy. Does not bruise/bleed easily.  Psychiatric/Behavioral: Negative for behavioral problems and decreased concentration.   Past Medical History:  Diagnosis Date  . Atrioventricular block, complete ([place])    a. 08/2011 Upgrage of PPM to MDT Adapta L Dual Chamber PPM ser # [id number].  . Cardiac pacemaker in situ   . Contact dermatitis and eczema    unspec cause  . Dairy product intolerance   .  Depression   . Diastolic dysfunction   . GERD (gastroesophageal reflux disease)   . Hiatal hernia   . History of CHF (congestive heart failure)   . History of diverticulitis of colon    2003  perforated diverticulitis w/ surgical intervention  . History of kidney stones    age 63  . History of peripheral edema    lower extremities  . History of thyroid nodule    multinodular goiter  s/p  total thyroidectomy 2014  . HTN (hypertension)   . Hydronephrosis, left   . Hypothyroidism   . Mild sleep apnea    per study 04-14-2014  . Nocturia   . Osteoporosis   . PAF (paroxysmal atrial fibrillation) ([place])   . PONV (postoperative nausea and vomiting)   . Pulmonary nodules    stable per last ct  . Renal cell carcinoma of left kidney ([facility])   . Shortness of breath    intermittant  . Superficial thrombophlebitis      Social History   Socioeconomic History  . Marital status: Single    Spouse name: Not on file  . Number of children: 2  . Years of education: 70  . Highest education level: Not on file  Occupational History  . 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 Smoking status: Former Smoker    Years: 15.00    Types: Cigarettes  Quit date: 10/06/1979    Years since quitting: 40.9  . Smokeless tobacco: Never Used  Substance and Sexual Activity  . Alcohol use: No  . Drug use: No  . Sexual activity: Not on file  Other Topics Concern  . 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  She was married for 22 years, divorced, and has remained single. She has 1 grown son, 1 grown daughter.  She has 3 grandchildren, 2 of her children   live in [place].  She is retired.  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   Social Determinants  of Health   Financial Resource Strain: Not on file  Food Insecurity: Not on file  Transportation Needs: Not on file  Physical Activity: Not on file  Stress: Not on file  Social Connections: Not on file  Intimate Partner Violence: Not on file    Past Surgical History:  Procedure Laterality Date  . CARDIOVASCULAR STRESS TEST  04-20-2015  dr Caryl Comes   normal nuclear study/  no ischemia/  normal LV function and wall motion, ef 74%  . COLONOSCOPY  01/19/2013  . COLOSTOMY TAKEDOWN  07-22-2002  . CYSTOSCOPY WITH RETROGRADE PYELOGRAM, URETEROSCOPY AND STENT PLACEMENT Left 07/28/2015   Procedure: CYSTOSCOPY WITH LEFT RETROGRADE PYELOGRAM, POSSIBLE LEFT URETEROSCOPY AND STENT PLACEMENT;  Surgeon: Franchot Gallo, MD;  Location: [facility];  Service: Urology;  Laterality: Left;  . EXPLORATORY LAPARTOMY SIGMOID COLECTOMY/ COLOSTOMY  04-28-2002   perforated diverticulitis  . KYPHOPLASTY    . LAPAROSCOPY VENTRAL HERNIA REPAIR/  EXTENSIVE LYSIS ADHESIONS  05-04-2004  . PACEMAKER GENERATOR CHANGE Left 09/16/2011   MDT Douglass pacemaker  . PACEMAKER PLACEMENT  05-11-2003     medtronic  . ROBOT ASSISTED LAPAROSCOPIC NEPHRECTOMY Left 03/20/2016   Procedure: XI ROBOTIC ASSISTED LAPAROSCOPIC RETROPERITONEAL NEPHRECTOMY;  Surgeon: Alexis Frock, MD;  Location: [facility];  Service: Urology;  Laterality: Left;  . THYROIDECTOMY N/A 08/07/2012   Procedure: THYROIDECTOMY;  Surgeon: Earnstine Regal, MD;  Location: [facility];  Service: General;  Laterality: N/A;  . TRANSTHORACIC ECHOCARDIOGRAM  10-12-2014   moderate focal basal LVH,  ef 55-60%,  grade 2 diastolic dysfunction, mild AV calcification without stenosis,  mild MR,  trivial PR    Family History  Problem Relation Age of Onset  . Heart disease Mother   . Stroke Mother   . Hypertension Mother   . Heart failure Father   . Hypertension Brother   . Heart disease Brother   . Diverticulosis Sister   . Hypertension Brother   . Cancer Son         breast  . Heart attack Neg Hx     Allergies  Allergen Reactions  . Tape Rash  . Codeine Nausea And Vomiting    Current Outpatient Medications on File Prior to Visit  Medication Sig Dispense Refill  . acetaminophen (TYLENOL) 500 MG tablet Take 1,000 mg by mouth daily as needed for moderate pain.    [name] amLODipine (NORVASC) 5 MG tablet Take 1 tablet (5 mg total) by mouth daily. 90 tablet 1  . apixaban (ELIQUIS) 5 MG TABS tablet Take 1 tablet (5 mg total) by mouth 2 (two) times daily. 180 tablet 1  . Chlorphen-Phenyleph-APAP (CORICIDIN D COLD/FLU/SINUS) 2-5-325 MG TABS Take 2  tablets by mouth daily.    [name] denosumab (PROLIA) 60 MG/ML SOSY injection Inject 60 mg into the skin every 6 (six) months.    . diclofenac Sodium (VOLTAREN) 1 % GEL Apply 4 g topically 4 (four) times daily. 100 g 2  . diphenhydramine-acetaminophen (TYLENOL PM) 25-500 MG TABS tablet Take 2 tablets by mouth at bedtime as needed (sleep/pain).    . fluticasone (FLONASE) 50 MCG/ACT nasal spray PLACE 2 SPRAYS INTO BOTH NOSTRILS DAILY AS NEEDED FOR ALLERGIES OR RHINITIS. 16 g 2  . hydrochlorothiazide (HYDRODIURIL) 12.5 MG tablet TAKE 1 TABLET BY MOUTH ONCE DAILY 90 tablet 1  . levocetirizine (XYZAL) 5 MG tablet Take 1 tablet (5 mg total) by mouth every evening. 90 tablet 3  . levothyroxine (SYNTHROID) 100 MCG tablet TAKE 1 TABLET BY MOUTH ONCE DAILY BEFORE BREAKFAST . 90 tablet 3  . lisinopril (ZESTRIL) 40 MG tablet TAKE 1 TABLET (40 MG TOTAL) BY MOUTH DAILY. 90 tablet 1  . loratadine (CLARITIN) 10 MG tablet Take 1 tablet (10 mg total) by mouth daily as needed for allergies. 100 tablet 2  . tiZANidine (ZANAFLEX) 4 MG capsule Take 1 capsule (4 mg total) by mouth 3 (three) times daily as needed for muscle spasms. (Patient not taking: Reported on 07/10/2020) 30 capsule 1   Current Facility-Administered Medications on File Prior to Visit  Medication Dose Route Frequency Provider Last Rate Last Admin  . aminophylline injection 150 mg   150 mg Intravenous BID PRN Dorothy Spark, MD   150 mg at 04/20/15 1200    BP (!) 135/49 (BP Location: Right Arm, Patient Position: Sitting, Cuff Size: Normal)   Pulse 96   Resp 18   Wt 147 lb 3.2 oz (66.8 kg)   SpO2 100%   BMI 25.67 kg/m       Objective:   Physical Exam  General Mental Status- Alert. General Appearance- Not in acute distress.   Skin General: Color- Normal Color. Moisture- Normal Moisture.  Neck Carotid Arteries- Normal color. Moisture- Normal Moisture. No carotid bruits. No JVD.  Left side trapezius nontender to palpation.  No mid cervical spine tenderness.  Chest and Lung Exam Auscultation: Breath Sounds:-Normal.  Cardiovascular Auscultation:Rythm- Regular. Murmurs & Other Heart Sounds:Auscultation of the heart reveals- No Murmurs.  Abdomen Inspection:-Inspeection Normal. Palpation/Percussion:Note:No mass. Palpation and Percussion of the abdomen reveal- Non Tender, Non Distended + BS, no rebound or guarding.    Neurologic Cranial Nerve exam:- CN III-XII intact(No nystagmus), symmetric smile. Drift Test:- No drift. Romberg Exam:- Negative.  Heal to Toe Gait exam:-Normal. Finger to Nose:- Normal/Intact Strength:- 5/5 equal and symmetric strength both upper and lower extremities.  Left upper extremity-no pain on palpation of shoulder.  No pain on range of motion.  On palpation of upper arm/tricep area no pain on palpation.  Does have prominent lipoma.        Assessment & Plan:  History of recent left upper extremity achiness with pain mid humerus region, shoulder and left trapezius area.  Pain not worsened with movement and not reproducible on exam.  Pain present since Sunday and woke up from sleep abruptly last night.  Pain still present.  History of hypertension, hyperlipidemia, history of atrial fibrillation, AV block and now has pacemaker.  Mother history of stent.  EKG today showed pacemaker present.  Some frequent ectopic ventricular  beats and EKG read anterior ST elevation that might be secondary to conduction deficit.  Staff had a difficult time with leads sticking.  With your  age, medical history and left upper extremity pain do think best that she be evaluated in the emergency department.  Troponin studies would be beneficial.  Also you have some direct tenderness to the left temporal region.  Sed rate would be helpful as well.  Follow-up date to be determined after ED evaluation.  Time spent with patient today was 40+  minutes which consisted of chart revdiew, discussing diagnosis, work up treatment and documentation.

## 2020-09-13 NOTE — Patient Instructions (Addendum)
History of recent left upper extremity achiness with pain mid humerus region, shoulder and left trapezius area.  Pain not worsened with movement and not reproducible on exam.  Pain present since Sunday and woke up from sleep abruptly last night.  Pain still present.  History of hypertension, hyperlipidemia, history of atrial fibrillation, AV block and now has pacemaker.  Mother history of stent.  EKG today showed pacemaker present.  Some frequent ectopic ventricular beats and EKG read anterior ST elevation that might be secondary to conduction deficit.  Staff had a difficult time with leads sticking.  With your age, medical history and left upper extremity pain do think best that she be evaluated in the emergency department.  Troponin studies would be beneficial.  Also you have some direct tenderness to the left temporal region.  Sed rate would be helpful as well.  Follow-up date to be determined after ED evaluation.  Called downstairs and  Update ED MD on recent presentation.

## 2020-09-13 NOTE — Progress Notes (Signed)
Pt received from [place] via [facility]. VSS. Telemetry applied. CHG complete. Pt complaining L arm pain that is aching 3/10. Call light in reach. Will continue to monitor.  Clyde Canterbury, RN

## 2020-09-14 ENCOUNTER — Observation Stay (HOSPITAL_COMMUNITY): Payer: Medicare Other

## 2020-09-14 ENCOUNTER — Encounter (HOSPITAL_COMMUNITY): Payer: Self-pay | Admitting: Internal Medicine

## 2020-09-14 ENCOUNTER — Inpatient Hospital Stay (HOSPITAL_COMMUNITY): Payer: Medicare Other

## 2020-09-14 DIAGNOSIS — I480 Paroxysmal atrial fibrillation: Secondary | ICD-10-CM | POA: Diagnosis present

## 2020-09-14 DIAGNOSIS — N179 Acute kidney failure, unspecified: Secondary | ICD-10-CM | POA: Diagnosis not present

## 2020-09-14 DIAGNOSIS — I509 Heart failure, unspecified: Secondary | ICD-10-CM | POA: Diagnosis present

## 2020-09-14 DIAGNOSIS — R079 Chest pain, unspecified: Secondary | ICD-10-CM | POA: Diagnosis present

## 2020-09-14 DIAGNOSIS — Z79890 Hormone replacement therapy (postmenopausal): Secondary | ICD-10-CM | POA: Diagnosis not present

## 2020-09-14 DIAGNOSIS — E876 Hypokalemia: Secondary | ICD-10-CM | POA: Diagnosis present

## 2020-09-14 DIAGNOSIS — I442 Atrioventricular block, complete: Secondary | ICD-10-CM | POA: Diagnosis present

## 2020-09-14 DIAGNOSIS — E890 Postprocedural hypothyroidism: Secondary | ICD-10-CM | POA: Diagnosis present

## 2020-09-14 DIAGNOSIS — I130 Hypertensive heart and chronic kidney disease with heart failure and stage 1 through stage 4 chronic kidney disease, or unspecified chronic kidney disease: Secondary | ICD-10-CM | POA: Diagnosis present

## 2020-09-14 DIAGNOSIS — I10 Essential (primary) hypertension: Secondary | ICD-10-CM

## 2020-09-14 DIAGNOSIS — Z888 Allergy status to other drugs, medicaments and biological substances status: Secondary | ICD-10-CM | POA: Diagnosis not present

## 2020-09-14 DIAGNOSIS — Z950 Presence of cardiac pacemaker: Secondary | ICD-10-CM | POA: Diagnosis not present

## 2020-09-14 DIAGNOSIS — Z7901 Long term (current) use of anticoagulants: Secondary | ICD-10-CM | POA: Diagnosis not present

## 2020-09-14 DIAGNOSIS — Z87891 Personal history of nicotine dependence: Secondary | ICD-10-CM | POA: Diagnosis not present

## 2020-09-14 DIAGNOSIS — Z20822 Contact with and (suspected) exposure to covid-19: Secondary | ICD-10-CM | POA: Diagnosis present

## 2020-09-14 DIAGNOSIS — Z823 Family history of stroke: Secondary | ICD-10-CM | POA: Diagnosis not present

## 2020-09-14 DIAGNOSIS — R0981 Nasal congestion: Secondary | ICD-10-CM | POA: Diagnosis not present

## 2020-09-14 DIAGNOSIS — Z885 Allergy status to narcotic agent status: Secondary | ICD-10-CM | POA: Diagnosis not present

## 2020-09-14 DIAGNOSIS — I429 Cardiomyopathy, unspecified: Secondary | ICD-10-CM | POA: Diagnosis present

## 2020-09-14 DIAGNOSIS — E785 Hyperlipidemia, unspecified: Secondary | ICD-10-CM | POA: Diagnosis present

## 2020-09-14 DIAGNOSIS — N1831 Chronic kidney disease, stage 3a: Secondary | ICD-10-CM | POA: Diagnosis present

## 2020-09-14 DIAGNOSIS — R0789 Other chest pain: Secondary | ICD-10-CM | POA: Diagnosis not present

## 2020-09-14 DIAGNOSIS — Z79899 Other long term (current) drug therapy: Secondary | ICD-10-CM | POA: Diagnosis not present

## 2020-09-14 DIAGNOSIS — K219 Gastro-esophageal reflux disease without esophagitis: Secondary | ICD-10-CM | POA: Diagnosis present

## 2020-09-14 DIAGNOSIS — Z85528 Personal history of other malignant neoplasm of kidney: Secondary | ICD-10-CM | POA: Diagnosis not present

## 2020-09-14 DIAGNOSIS — M810 Age-related osteoporosis without current pathological fracture: Secondary | ICD-10-CM | POA: Diagnosis present

## 2020-09-14 DIAGNOSIS — E7800 Pure hypercholesterolemia, unspecified: Secondary | ICD-10-CM | POA: Diagnosis present

## 2020-09-14 DIAGNOSIS — Z8249 Family history of ischemic heart disease and other diseases of the circulatory system: Secondary | ICD-10-CM | POA: Diagnosis not present

## 2020-09-14 LAB — BASIC METABOLIC PANEL
Anion gap: 7 (ref 5–15)
BUN: 19 mg/dL (ref 8–23)
CO2: 25 mmol/L (ref 22–32)
Calcium: 9.3 mg/dL (ref 8.9–10.3)
Chloride: 104 mmol/L (ref 98–111)
Creatinine, Ser: 1.13 mg/dL — ABNORMAL HIGH (ref 0.44–1.00)
GFR, Estimated: 49 mL/min — ABNORMAL LOW (ref >60)
Glucose, Bld: 102 mg/dL — ABNORMAL HIGH (ref 70–99)
Potassium: 4.1 mmol/L (ref 3.5–5.1)
Sodium: 136 mmol/L (ref 135–145)

## 2020-09-14 LAB — ECHOCARDIOGRAM COMPLETE
AR max vel: 2.46 cm2
AV Area VTI: 2.65 cm2
AV Area mean vel: 2.77 cm2
AV Mean grad: 3 mmHg
AV Peak grad: 6.5 mmHg
Ao pk vel: 1.27 m/s
Area-P 1/2: 4.6 cm2
Height: 63 in
S' Lateral: 2.8 cm
Weight: 2335.11 oz

## 2020-09-14 LAB — CBC
HCT: 35.3 % — ABNORMAL LOW (ref 36.0–46.0)
Hemoglobin: 12 g/dL (ref 12.0–15.0)
MCH: 28.4 pg (ref 26.0–34.0)
MCHC: 34 g/dL (ref 30.0–36.0)
MCV: 83.5 fL (ref 80.0–100.0)
Platelets: 144 10*3/uL — ABNORMAL LOW (ref 150–400)
RBC: 4.23 MIL/uL (ref 3.87–5.11)
RDW: 12.4 % (ref 11.5–15.5)
WBC: 5.4 10*3/uL (ref 4.0–10.5)
nRBC: 0 % (ref 0.0–0.2)

## 2020-09-14 MED ORDER — PANTOPRAZOLE SODIUM 40 MG PO TBEC
40.0000 mg | DELAYED_RELEASE_TABLET | Freq: Every day | ORAL | Status: DC
  Administered 2020-09-14: 40 mg via ORAL
  Filled 2020-09-14: qty 1

## 2020-09-14 MED ORDER — PANTOPRAZOLE SODIUM 40 MG PO TBEC
40.0000 mg | DELAYED_RELEASE_TABLET | Freq: Two times a day (BID) | ORAL | Status: DC
  Administered 2020-09-14 – 2020-09-17 (×6): 40 mg via ORAL
  Filled 2020-09-14 (×6): qty 1

## 2020-09-14 MED ORDER — MORPHINE SULFATE (PF) 2 MG/ML IV SOLN
INTRAVENOUS | Status: AC
  Administered 2020-09-14: 2 mg via INTRAVENOUS
  Filled 2020-09-14: qty 1

## 2020-09-14 MED ORDER — METOCLOPRAMIDE HCL 5 MG/ML IJ SOLN
5.0000 mg | Freq: Once | INTRAMUSCULAR | Status: AC
  Administered 2020-09-14: 5 mg via INTRAVENOUS
  Filled 2020-09-14: qty 2

## 2020-09-14 MED ORDER — DIPHENHYDRAMINE HCL 25 MG PO CAPS
25.0000 mg | ORAL_CAPSULE | Freq: Once | ORAL | Status: AC
  Administered 2020-09-15: 25 mg via ORAL
  Filled 2020-09-14: qty 1

## 2020-09-14 MED ORDER — FLUTICASONE PROPIONATE 50 MCG/ACT NA SUSP: 2.0000 | Freq: Every day | NASAL | Status: DC | PRN

## 2020-09-14 MED ORDER — ALUM & MAG HYDROXIDE-SIMETH 200-200-20 MG/5ML PO SUSP
30.0000 mL | Freq: Four times a day (QID) | ORAL | Status: DC | PRN
  Administered 2020-09-14 – 2020-09-17 (×3): 30 mL via ORAL
  Filled 2020-09-14 (×4): qty 30

## 2020-09-14 MED ORDER — LIDOCAINE VISCOUS HCL 2 % MT SOLN
15.0000 mL | Freq: Four times a day (QID) | OROMUCOSAL | Status: DC | PRN
  Administered 2020-09-14: 15 mL via ORAL
  Filled 2020-09-14: qty 15

## 2020-09-14 MED ORDER — KETOROLAC TROMETHAMINE 30 MG/ML IJ SOLN: 30.0000 mg | Freq: Four times a day (QID) | INTRAMUSCULAR | Status: DC | PRN

## 2020-09-14 MED ORDER — LORATADINE 10 MG PO TABS
10.0000 mg | ORAL_TABLET | Freq: Every day | ORAL | Status: DC | PRN
  Administered 2020-09-14: 10 mg via ORAL
  Filled 2020-09-14: qty 1

## 2020-09-14 MED ORDER — MORPHINE SULFATE (PF) 2 MG/ML IV SOLN: 1.0000 mg | INTRAVENOUS | Status: DC | PRN

## 2020-09-14 MED ORDER — CHLORPHEN-PE-ACETAMINOPHEN 2-5-325 MG PO TABS: 2.0000 | ORAL_TABLET | Freq: Every day | ORAL | Status: DC

## 2020-09-14 MED ORDER — FLUTICASONE PROPIONATE 50 MCG/ACT NA SUSP
2.0000 | Freq: Every day | NASAL | Status: DC
  Administered 2020-09-14 – 2020-09-17 (×4): 2 via NASAL
  Filled 2020-09-14 (×2): qty 16

## 2020-09-14 NOTE — Progress Notes (Signed)
  Echocardiogram 2D Echocardiogram has been performed.  Merrie Roof F 09/14/2020, 10:55 AM

## 2020-09-14 NOTE — Consult Note (Addendum)
Cardiology Consultation:   Patient ID: [name] MRN: [id number]; DOB: 10-Mar-1941  Admit date: 09/13/2020 Date of Consult: 09/14/2020  PCP:  Lowne Chase, [facility]  Cardiologist:  Virl Axe, MD  Advanced Practice Provider:  No care team member to display Electrophysiologist:  Virl Axe, MD   Patient Profile:   [name] is a 80 y.o. 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 Grandville Silos.  History of Present Illness:   [name] has followed with Dr. Caryl Comes primarily for EP history of PPM, PAF, and remote NSVT. She has no known history of CAD. She has undergone previous ischemic evaluation for dyspnea with nuclear stress test 2016 which was normal. Last echo 2016 showed EF 55-60%, grade 2 DD, mild MR. In general she considers herself fairly active for 80 and enjoys baking and walking. She smoked socially for about 10 years and quit 60 years ago. Mother had stent in her 56s.   On Sunday 3/13 she developed an upset stomach. She did not necessarily have diarrhea but moreso several stools that were looser than usual. She also had nausea and developed a sense of upper chest discomfort winding around her left neck and down into her left arm that felt like acid reflux. This lasted all the way from Sunday night, all day Monday, woke her up Tuesday and continued to last all day Tuesday. She finally went to her PCP on Wednesday for evaluation. Given her symptoms, she was sent to the ED for evaluation. Her blood pressure was higher than usual. In the ER she was treated with a GI cocktail which she said helped  significantly. She started to feel the discomfort come back this morning and states Protonix helped. She was feeling slightly more SOB on Monday-Tuesday but this has since resolved. Labs show hsTroponin negative x 2, BNP 226, ESR 3 (done for tenderness over temporal artery), Covid test negative, mild thrombocytopenia (intermittent in the past), Cr 1.13 (previous baseline 1-1.3), 3.4 s/p supplementation to 4.1. She denies any further chest discomfort presently. States she walked in the halls and felt better. Her biggest complaint is related to back/leg discomfort from lying in the bed given her prior compression fractures. 2D echo today reported EF 40-45%, mild asymmetric LVH, grade 2 DD, mild MR, mild-moderate TR. She has not had any orthopnea. She does have intermittent dependent edema after being on her feet all day but this is not unusual. It is not present today.  Past Medical History:  Diagnosis Date  . Atrioventricular block, complete ([place])    a. 08/2011 Upgrade of PPM to MDT Adapta L Dual Chamber PPM ser # [id number].  . Cardiac pacemaker in situ   . Contact dermatitis and eczema    unspec cause  . Dairy product intolerance   . Depression   . Diastolic dysfunction   . GERD (gastroesophageal reflux disease)   . Hiatal hernia   . History of diverticulitis of colon    2003  perforated diverticulitis w/ surgical intervention  . History of kidney stones    age 45  . History of peripheral edema    lower extremities  .  History of thyroid nodule    multinodular goiter  s/p  total thyroidectomy 2014  . HTN (hypertension)   . Hydronephrosis, left   . Hypothyroidism   . Mild sleep apnea    per study 04-14-2014  . Nocturia   . NSVT (nonsustained ventricular tachycardia) ([place])   . Osteoporosis   . PAF (paroxysmal atrial fibrillation) ([place])   . PONV (postoperative nausea and vomiting)   . Pulmonary nodules    stable per last ct  . Renal cell carcinoma of left kidney (HCC)   . Superficial  thrombophlebitis     Past Surgical History:  Procedure Laterality Date  . CARDIOVASCULAR STRESS TEST  04-20-2015  dr Caryl Comes   normal nuclear study/  no ischemia/  normal LV function and wall motion, ef 74%  . COLONOSCOPY  01/19/2013  . COLOSTOMY TAKEDOWN  07-22-2002  . CYSTOSCOPY WITH RETROGRADE PYELOGRAM, URETEROSCOPY AND STENT PLACEMENT Left 07/28/2015   Procedure: CYSTOSCOPY WITH LEFT RETROGRADE PYELOGRAM, POSSIBLE LEFT URETEROSCOPY AND STENT PLACEMENT;  Surgeon: Franchot Gallo, MD;  Location: [facility];  Service: Urology;  Laterality: Left;  . EXPLORATORY LAPARTOMY SIGMOID COLECTOMY/ COLOSTOMY  04-28-2002   perforated diverticulitis  . KYPHOPLASTY    . LAPAROSCOPY VENTRAL HERNIA REPAIR/  EXTENSIVE LYSIS ADHESIONS  05-04-2004  . PACEMAKER GENERATOR CHANGE Left 09/16/2011   MDT [place] pacemaker  . PACEMAKER PLACEMENT  05-11-2003     medtronic  . ROBOT ASSISTED LAPAROSCOPIC NEPHRECTOMY Left 03/20/2016   Procedure: XI ROBOTIC ASSISTED LAPAROSCOPIC RETROPERITONEAL NEPHRECTOMY;  Surgeon: Alexis Frock, MD;  Location: [facility];  Service: Urology;  Laterality: Left;  . THYROIDECTOMY N/A 08/07/2012   Procedure: THYROIDECTOMY;  Surgeon: Earnstine Regal, MD;  Location: [facility];  Service: General;  Laterality: N/A;  . TRANSTHORACIC ECHOCARDIOGRAM  10-12-2014   moderate focal basal LVH,  ef 55-60%,  grade 2 diastolic dysfunction, mild AV calcification without stenosis,  mild MR,  trivial PR     Home Medications:  Prior to Admission medications   Medication Sig Start Date End Date Taking? Authorizing Provider  acetaminophen (TYLENOL) 500 MG tablet Take 1,000 mg by mouth daily as needed for moderate pain.   Yes [provider]  amLODipine (NORVASC) 5 MG tablet Take 1 tablet (5 mg total) by mouth daily. 06/08/20  Yes [name], DO  apixaban (ELIQUIS) 5 MG TABS tablet Take 1 tablet (5 mg total) by mouth 2 (two) times daily. 06/28/20  Yes Deboraha Sprang, MD   Chlorphen-Phenyleph-APAP (CORICIDIN D COLD/FLU/SINUS) 2-5-325 MG TABS Take 2 tablets by mouth daily.   Yes [provider]  denosumab (PROLIA) 60 MG/ML SOSY injection Inject 60 mg into the skin every 6 (six) months.   Yes [provider]  diclofenac Sodium (VOLTAREN) 1 % GEL Apply 4 g topically 4 (four) times daily. 04/03/20  Yes Roma Schanz R, DO  diphenhydramine-acetaminophen (TYLENOL PM) 25-500 MG TABS tablet Take 2 tablets by mouth at bedtime as needed (sleep/pain).   Yes [provider]  fluticasone (FLONASE) 50 MCG/ACT nasal spray PLACE 2 SPRAYS INTO BOTH NOSTRILS DAILY AS NEEDED FOR ALLERGIES OR RHINITIS. 02/10/20  Yes Carollee Herter, Yvonne R, DO  hydrochlorothiazide (HYDRODIURIL) 12.5 MG tablet TAKE 1 TABLET BY MOUTH ONCE DAILY Patient taking differently: Take 12.5 mg by mouth daily. 07/10/20  Yes Roma Schanz R, DO  levocetirizine (XYZAL) 5 MG tablet Take 1 tablet (5 mg total) by mouth every evening. 05/19/20  Yes Roma Schanz R, DO  levothyroxine (  SYNTHROID) 100 MCG tablet TAKE 1 TABLET BY MOUTH ONCE DAILY BEFORE BREAKFAST . Patient taking differently: Take 100 mcg by mouth daily before breakfast. TAKE 1 TABLET BY MOUTH ONCE DAILY BEFORE BREAKFAST . 12/23/19  Yes Lowne Chase, Yvonne R, DO  lisinopril (ZESTRIL) 40 MG tablet TAKE 1 TABLET (40 MG TOTAL) BY MOUTH DAILY. 07/10/20  Yes Seabron Spates R, DO  loratadine (CLARITIN) 10 MG tablet Take 1 tablet (10 mg total) by mouth daily as needed for allergies. 04/03/20  Yes Seabron Spates R, DO  tiZANidine (ZANAFLEX) 4 MG capsule Take 1 capsule (4 mg total) by mouth 3 (three) times daily as needed for muscle spasms. Patient not taking: No sig reported 04/03/20   Donato Schultz, DO    Inpatient Medications: Scheduled Meds: . apixaban  5 mg Oral BID  . fluticasone  2 spray Each Nare Daily  . levothyroxine  100 mcg Oral Q0600  . lisinopril  40 mg Oral Daily  . metoprolol tartrate  25 mg  Oral BID  . pantoprazole  40 mg Oral Q0600   Continuous Infusions:  PRN Meds: acetaminophen, alum & mag hydroxide-simeth **AND** lidocaine, loratadine, nitroGLYCERIN, ondansetron (ZOFRAN) IV  Allergies:    Allergies  Allergen Reactions  . Tape Rash  . Codeine Nausea And Vomiting    Social History:   Social History   Socioeconomic History  . Marital status: Single    Spouse name: Not on file  . Number of children: 2  . Years of education: 53  . Highest education level: Not on file  Occupational History  . Occupation: Retired Nurse, adult for the [employer]    Employer: RETIRED  Tobacco Use  . Smoking status: Former Smoker    Years: 15.00    Types: Cigarettes    Quit date: 10/06/1979    Years since quitting: 40.9  . Smokeless tobacco: Never Used  Substance and Sexual Activity  . Alcohol use: No  . Drug use: No  . Sexual activity: Not on file  Other Topics Concern  . Not on file  Social History Narrative   The patient is a Buyer, retail of [employer].  She worked for the [employer] in [employer].  She was married for 22 years, divorced, and has remained single. She has 1 grown son, 1 grown daughter.  She has 3 grandchildren, 2 of her children   live in [place].  She is retired.  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   Social Determinants of Health   Financial Resource Strain: Not on file  Food Insecurity: Not on file  Transportation Needs: Not on file  Physical Activity: Not on file  Stress: Not on file  Social Connections: Not on file  Intimate Partner Violence: Not on file    Family History:    Family History  Problem Relation Age of Onset  . Heart disease Mother        Stent in her 75s  . Stroke Mother   . Hypertension Mother   . Heart failure Father   . Hypertension Brother   . Heart disease Brother   . Diverticulosis Sister   .  Hypertension Brother   . Cancer Son        breast  . Heart attack Neg Hx      ROS:  Please see the history of present  illness.  All other ROS reviewed and negative.     Physical Exam/Data:   Vitals:   09/14/20 0428 09/14/20 0746 09/14/20 1253 09/14/20 1519  BP: 134/82 (!) 126/53 118/72 136/64  Pulse: 65 74 60 (!) 56  Resp: _0 Temp: (!) 97.5 F (36.4 C) (!) 97.5 F (36.4 C) 98.1 F (36.7 C)   TempSrc: Oral Oral Oral   SpO2: 98% 97% 97% 96%  Weight:      Height:       No intake or output data in the 24 hours ending 09/14/20 1525 Last 3 Weights 09/13/2020 09/13/2020 09/13/2020  Weight (lbs) 145 lb 15.1 oz 147 lb 147 lb 3.2 oz  Weight (kg) 66.2 kg 66.679 kg 66.769 kg     Body mass index is 25.85 kg/m.  General: Well developed, well nourished F, in no acute distress. Head: Normocephalic, atraumatic, sclera non-icteric, no xanthomas, nares are without discharge. Neck: Negative for carotid bruits. JVP not elevated. Lungs: Clear bilaterally to auscultation without wheezes, rales, or rhonchi. Breathing is unlabored. Heart: RRR S1 S2 without murmurs, rubs, or gallops. PPM L upper chest wall. Abdomen: Soft, non-tender, non-distended with normoactive bowel sounds. No rebound/guarding. Extremities: No clubbing or cyanosis. No edema. Distal pedal pulses are 2+ and equal bilaterally. Neuro: Alert and oriented X 3. Moves all extremities spontaneously. Psych:  Responds to questions appropriately with a normal affect.  EKG:  The EKG was personally reviewed and demonstrates:  NSR 85bpm with PACs, left axis deviation, V paced rhythm   Telemetry:  Telemetry was personally reviewed and demonstrates:  Atrial sensed, V pacing, occasional PACs  Relevant CV Studies: 2D echo 09/13/20  1. Left ventricular ejection fraction, by estimation, is 40 to 45%. The  left ventricle has mildly decreased function. The left ventricle has no  regional wall motion abnormalities. There is mild  asymmetric left  ventricular hypertrophy. Left ventricular  diastolic parameters are consistent with Grade II diastolic dysfunction  (pseudonormalization).  2. Right ventricular systolic function is normal. The right ventricular  size is normal. There is normal pulmonary artery systolic pressure.  3. The mitral valve is grossly normal. Mild mitral valve regurgitation.  4. Tricuspid valve regurgitation is mild to moderate.  5. The aortic valve is tricuspid. Aortic valve regurgitation is not  visualized.   Laboratory Data:  High Sensitivity Troponin:   Recent Labs  Lab 09/13/20 1208 09/13/20 1446  TROPONINIHS 12 14     Chemistry Recent Labs  Lab 09/13/20 1208 09/14/20 0128  NA 136 136  K 3.4* 4.1  CL 99 104  CO2 26 25  GLUCOSE 99 102*  BUN 18 19  CREATININE 1.01* 1.13*  CALCIUM 9.9 9.3  GFRNONAA 56* 49*  ANIONGAP 11 7    No results for input(s): PROT, ALBUMIN, AST, ALT, ALKPHOS, BILITOT in the last 168 hours. Hematology Recent Labs  Lab 09/13/20 1208 09/14/20 0128  WBC 5.4 5.4  RBC 4.75 4.23  HGB 13.1 12.0  HCT 40.2 35.3*  MCV 84.6 83.5  MCH 27.6 28.4  MCHC 32.6 34.0  RDW 12.5 12.4  PLT 147* 144*   BNP Recent Labs  Lab 09/13/20 1208  BNP 226.9*     Radiology/Studies:  DG Chest 2 View  Result Date: 09/13/2020 CLINICAL DATA:  Chest pain. Additional history provided: Chest pain and left arm pain since Sunday, worsening last night, dizziness when standing. EXAM: CHEST - 2 VIEW COMPARISON:  CT chest 03/16/2018.  Chest radiographs 07/30/2016. FINDINGS: Redemonstrated left  chest dual lead pacer device. Heart size within normal limits. Aortic atherosclerosis. No appreciable airspace consolidation. No evidence of pleural effusion or pneumothorax. No acute bony abnormality is identified. Surgical clips within the lower neck. Multiple previously augmented vertebral compression fractures within the thoracic and lumbar spine. IMPRESSION: No evidence of acute  cardiopulmonary abnormality. Aortic Atherosclerosis (ICD10-I70.0). Electronically Signed   By: Kellie Simmering DO   On: 09/13/2020 12:48   ECHOCARDIOGRAM COMPLETE  Result Date: 09/14/2020    ECHOCARDIOGRAM REPORT   Patient Name:   KITA NEACE Mamone Date of Exam: 09/14/2020 Medical Rec #:  [id number]       Height:       63.0 in Accession #:    [id number]      Weight:       145.9 lb Date of Birth:  12-30-1940        BSA:          1.691 m Patient Age:    68 years        BP:           126/53 mmHg Patient Gender: F               HR:           63 bpm. Exam Location:  Inpatient Procedure: 2D Echo, Cardiac Doppler and Color Doppler Indications:    R07.9* Chest pain, unspecified  History:        Patient has prior history of Echocardiogram examinations, most                 recent 10/12/2014.  Sonographer:    Merrie Roof RDCS Referring Phys: [id number] [place]  1. Left ventricular ejection fraction, by estimation, is 40 to 45%. The left ventricle has mildly decreased function. The left ventricle has no regional wall motion abnormalities. There is mild asymmetric left ventricular hypertrophy. Left ventricular diastolic parameters are consistent with Grade II diastolic dysfunction (pseudonormalization).  2. Right ventricular systolic function is normal. The right ventricular size is normal. There is normal pulmonary artery systolic pressure.  3. The mitral valve is grossly normal. Mild mitral valve regurgitation.  4. Tricuspid valve regurgitation is mild to moderate.  5. The aortic valve is tricuspid. Aortic valve regurgitation is not visualized. FINDINGS  Left Ventricle: Left ventricular ejection fraction, by estimation, is 40 to 45%. The left ventricle has mildly decreased function. The left ventricle has no regional wall motion abnormalities. The left ventricular internal cavity size was normal in size. There is mild asymmetric left ventricular hypertrophy. Left ventricular diastolic parameters are consistent with  Grade II diastolic dysfunction (pseudonormalization). Right Ventricle: The right ventricular size is normal. No increase in right ventricular wall thickness. Right ventricular systolic function is normal. There is normal pulmonary artery systolic pressure. The tricuspid regurgitant velocity is 2.65 m/s, and  with an assumed right atrial pressure of 3 mmHg, the estimated right ventricular systolic pressure is 90.2 mmHg. Left Atrium: Left atrial size was normal in size. Right Atrium: Right atrial size was normal in size. Pericardium: There is no evidence of pericardial effusion. Mitral Valve: The mitral valve is grossly normal. Mild mitral valve regurgitation. Tricuspid Valve: The tricuspid valve is grossly normal. Tricuspid valve regurgitation is mild to moderate. Aortic Valve: The aortic valve is tricuspid. Aortic valve regurgitation is not visualized. Aortic valve mean gradient measures 3.0 mmHg. Aortic valve peak gradient measures 6.5 mmHg. Aortic valve area, by VTI measures 2.65 cm. Pulmonic Valve: The pulmonic valve was  normal in structure. Pulmonic valve regurgitation is not visualized. Aorta: The aortic root and ascending aorta are structurally normal, with no evidence of dilitation. IAS/Shunts: The atrial septum is grossly normal. Additional Comments: A device lead is visualized.  LEFT VENTRICLE PLAX 2D LVIDd:         4.10 cm  Diastology LVIDs:         2.80 cm  LV e' medial:    6.85 cm/s LV PW:         0.80 cm  LV E/e' medial:  12.0 LV IVS:        1.20 cm  LV e' lateral:   10.20 cm/s LVOT diam:     1.80 cm  LV E/e' lateral: 8.1 LV SV:         73 LV SV Index:   43 LVOT Area:     2.54 cm  RIGHT VENTRICLE          IVC RV Basal diam:  3.20 cm  IVC diam: 1.70 cm LEFT ATRIUM             Index       RIGHT ATRIUM           Index LA diam:        3.60 cm 2.13 cm/m  RA Area:     15.10 cm LA Vol (A2C):   53.5 ml 31.63 ml/m RA Volume:   42.90 ml  25.37 ml/m LA Vol (A4C):   26.3 ml 15.55 ml/m LA Biplane Vol: 38.6 ml  22.82 ml/m  AORTIC VALVE AV Area (Vmax):    2.46 cm AV Area (Vmean):   2.77 cm AV Area (VTI):     2.65 cm AV Vmax:           127.00 cm/s AV Vmean:          86.200 cm/s AV VTI:            0.275 m AV Peak Grad:      6.5 mmHg AV Mean Grad:      3.0 mmHg LVOT Vmax:         123.00 cm/s LVOT Vmean:        94.000 cm/s LVOT VTI:          0.286 m LVOT/AV VTI ratio: 1.04  AORTA Ao Root diam: 2.90 cm Ao Asc diam:  3.80 cm MITRAL VALVE               TRICUSPID VALVE MV Area (PHT): 4.60 cm    TR Peak grad:   28.1 mmHg MV Decel Time: 165 msec    TR Vmax:        265.00 cm/s MV E velocity: 82.30 cm/s MV A velocity: 90.40 cm/s  SHUNTS MV E/A ratio:  0.91        Systemic VTI:  0.29 m                            Systemic Diam: 1.80 cm Mertie Moores MD Electronically signed by Mertie Moores MD Signature Date/Time: 09/14/2020/12:36:32 PM    Final      Assessment and Plan:   1. Chest pain with mixed features, also recent GI illness - troponins negative - EKG paced so challenging to assess - chest pain relieved with GI measures, not specifically exertional - general feeling is that this is more likely GI related but given incidentally noted new drop in LVEF, will discuss further evaluation  with MD   2. New cardiomyopathy of uncertain etiology - her troponins are negative so this does not seem related to acute MI process - chronicity unclear and she does not appear volume overloaded - will discuss further eval and med management with MD - may be beneficial for Dr. Radford Pax to personally review images  3. Paroxysmal atrial fibrillation (also prior h/o PPM) - chest pain does not appear to be related to arrhythmia as she has been maintaining NSR - on Eliquis PTA which has been continued here  4. Probable CKD stage IIIa - Cr appears relatively similar to prior  5. Essential HTN - blood pressure was higher on arrival but much improved currently - home regimen: lisinopril 34m, amlodipine 546m HCTZ 12.90m590m current  regimen: lisinopril 72m26metoprolol 290mg90m  - manage in context of the above   Risk Assessment/Risk Scores:     HEAR Score (for undifferentiated chest pain):  HEAR Score: 4  New York Heart Association (NYHA) Functional Class NYHA Class II  CHA2DS2-VASc Score = 6  This indicates a 9.7% annual risk of stroke. The patient's score is based upon: CHF History: Yes HTN History: Yes Diabetes History: No Stroke History: No Vascular Disease History: Yes - aortic atherosclerosis Age Score: 2 Gender Score: 1      For questions or updates, please contact [facility] consult [email or web address] for contact info under    Signed, DaynaCharlie PitterC  09/14/2020 3:25 PM

## 2020-09-14 NOTE — Progress Notes (Signed)
PROGRESS NOTE    [name]  WER:[id number] DOB: 1940-10-10 DOA: 09/13/2020 PCP: [name], DO    Chief Complaint  Patient presents with  . Chest Pain    Brief Narrative:  Patient is a 80 year old female history of paroxysmal A. fib on Eliquis, CHB status post PPM, hypertension, hyperlipidemia, hypothyroidism presented to the ED with substernal chest pain radiating to the left jaw left shoulder left upper extremity with arm pain described as a aching pain.  Patient also states some improvement with chest pain with belching.  Patient endorses shortness of breath when she walks up a flight of stairs which is new onset. Patient seen in the ED admitted for chest pain rule out.  Cardiac enzymes negative x3.  2D echo pending.  Chest x-ray unremarkable.  Cardiology consultation pending for evaluation for inpatient versus outpatient invasive work-up.   Assessment & Plan:   Principal Problem:   Chest pain Active Problems:   Essential hypertension   AV BLOCK, COMPLETE   Hypothyroidism, postsurgical   Paroxysmal atrial fibrillation (HCC)   Congestion of nasal sinus  #1 chest pain Patient presented with chest pain with both typical and atypical features.  High-sensitivity troponin negative x2.  EKG with with right bundle branch block, paced rhythm with no acute ischemic changes noted.  Patient low risk for PE by Wells and modified Geneva score with adherence to Eliquis. -Patient with multiple cardiac risk factors. -2D echo pending. -Continue Lopressor, lisinopril, Eliquis. -Placed on a PPI, GI cocktail as needed. -Due to multiple cardiac risk factors consulted cardiology for further evaluation for inpatient versus outpatient invasive work-up.  2.  Hypothyroidism Continue home regimen Synthroid.  3.  Hypertension Well-controlled on current regimen of Lopressor and lisinopril.  Continue to hold Norvasc and HCTZ  4.  Paroxysmal atrial fibrillation/history of CHB status post  PPM Continue Lopressor for rate control.  Eliquis for anticoagulation.  5.  Hypokalemia Likely secondary to HCTZ.  Repleted.  Follow.  6.  Congestion of nasal sinus Patient with complaints of nasal congestion and probable sinus headache.  Patient requesting home regimen of coricidin D to be resumed.  Resume home regimen Flonase.  Claritin as needed.   DVT prophylaxis: Eliquis Code Status: Full Family Communication: Updated patient.  No family at bedside. Disposition:   Status is: Observation    Dispo: The patient is from: Home              Anticipated d/c is to: Home              Patient currently awaiting 2D echo, work-up for chest pain underway, cardiology consultation pending.  Not stable for discharge.   Difficult to place patient no       Consultants:   Cardiology pending  Procedures:   2D echo pending 09/14/2020  Chest x-ray 09/13/2020    Antimicrobials:  None   Subjective: States some improvement with chest pain after GI cocktail with some belching.  Still with left neck left shoulder left upper extremity pain that she describes as an aching pain.  No nausea or emesis.  Feeling better than on admission.  Tolerating current diet. Patient complaining of headache/congestion.  Objective: Vitals:   09/13/20 1747 09/13/20 2328 09/14/20 0428 09/14/20 0746  BP: (!) 163/67 (!) 131/51 134/82 (!) 126/53  Pulse: 63 64 65 74  Resp: 16 19 16 18   Temp: 97.9 F (36.6 C) 97.8 F (36.6 C) (!) 97.5 F (36.4 C) (!) 97.5 F (36.4 C)  TempSrc: Oral Oral Oral Oral  SpO2: 100% 96% 98% 97%  Weight:      Height:       No intake or output data in the 24 hours ending 09/14/20 1036 Filed Weights   09/13/20 1158 09/13/20 1700  Weight: 66.7 kg 66.2 kg    Examination:  General exam: Appears calm and comfortable  Respiratory system: Clear to auscultation. Respiratory effort normal. Cardiovascular system: S1 & S2 heard, RRR. No JVD, murmurs, rubs, gallops or clicks. No  pedal edema. Gastrointestinal system: Abdomen is nondistended, soft and nontender. No organomegaly or masses felt. Normal bowel sounds heard. Central nervous system: Alert and oriented. No focal neurological deficits. Extremities: Symmetric 5 x 5 power. Skin: No rashes, lesions or ulcers Psychiatry: Judgement and insight appear normal. Mood & affect appropriate.     Data Reviewed: I have personally reviewed following labs and imaging studies  CBC: Recent Labs  Lab 09/13/20 1208 09/14/20 0128  WBC 5.4 5.4  HGB 13.1 12.0  HCT 40.2 35.3*  MCV 84.6 83.5  PLT 147* 144*    Basic Metabolic Panel: Recent Labs  Lab 09/13/20 1208 09/14/20 0128  NA 136 136  K 3.4* 4.1  CL 99 104  CO2 26 25  GLUCOSE 99 102*  BUN 18 19  CREATININE 1.01* 1.13*  CALCIUM 9.9 9.3    GFR: Estimated Creatinine Clearance: 35.1 mL/min (A) (by C-G formula based on SCr of 1.13 mg/dL (H)).  Liver Function Tests: No results for input(s): AST, ALT, ALKPHOS, BILITOT, PROT, ALBUMIN in the last 168 hours.  CBG: No results for input(s): GLUCAP in the last 168 hours.   Recent Results (from the past 240 hour(s))  SARS CORONAVIRUS 2 (TAT 6-24 HRS) Nasopharyngeal Nasopharyngeal Swab     Status: None   Collection Time: 09/13/20  2:03 PM   Specimen: Nasopharyngeal Swab  Result Value Ref Range Status   SARS Coronavirus 2 NEGATIVE NEGATIVE Final    Comment: (NOTE) SARS-CoV-2 target nucleic acids are NOT DETECTED.  The SARS-CoV-2 RNA is generally detectable in upper and lower respiratory specimens during the acute phase of infection. Negative results do not preclude SARS-CoV-2 infection, do not rule out co-infections with other pathogens, and should not be used as the sole basis for treatment or other patient management decisions. Negative results must be combined with clinical observations, patient history, and epidemiological information. The expected result is Negative.  Fact Sheet for  Patients: SugarRoll.be  Fact Sheet for Healthcare Providers: https://www.woods-mathews.com/  This test is not yet approved or cleared by the [place] FDA and  has been authorized for detection and/or diagnosis of SARS-CoV-2 by FDA under an Emergency Use Authorization (EUA). This EUA will remain  in effect (meaning this test can be used) for the duration of the COVID-19 declaration under Se ction 564(b)(1) of the Act, 21 U.S.C. section 360bbb-3(b)(1), unless the authorization is terminated or revoked sooner.  Performed at [facility], [place] [address] [place], [place] [address]          Radiology Studies: DG Chest 2 View  Result Date: 09/13/2020 CLINICAL DATA:  Chest pain. Additional history provided: Chest pain and left arm pain since Sunday, worsening last night, dizziness when standing. EXAM: CHEST - 2 VIEW COMPARISON:  CT chest 03/16/2018.  Chest radiographs 07/30/2016. FINDINGS: Redemonstrated left chest dual lead pacer device. Heart size within normal limits. Aortic atherosclerosis. No appreciable airspace consolidation. No evidence of pleural effusion or pneumothorax. No acute bony abnormality is identified. Surgical  clips within the lower neck. Multiple previously augmented vertebral compression fractures within the thoracic and lumbar spine. IMPRESSION: No evidence of acute cardiopulmonary abnormality. Aortic Atherosclerosis (ICD10-I70.0). Electronically Signed   By: Kellie Simmering DO   On: 09/13/2020 12:48        Scheduled Meds: . apixaban  5 mg Oral BID  . fluticasone  2 spray Each Nare Daily  . levothyroxine  100 mcg Oral Q0600  . lisinopril  40 mg Oral Daily  . metoprolol tartrate  25 mg Oral BID  . pantoprazole  40 mg Oral Q0600   Continuous Infusions:   LOS: 0 days    Time spent: 40 minutes    Irine Seal, MD [facility]   To contact the attending provider between 7A-7P or the covering  provider during after hours 7P-7A, please log into the web site [email or web address] and access using universal [place] password for that web site. If you do not have the password, please call the hospital operator.  09/14/2020, 10:36 AM

## 2020-09-14 NOTE — Progress Notes (Addendum)
Pt requested nurse to bring that "Cocktail" like she had yesterday.   C/o sudden onset heartburn in mid chest.   Associated symptoms include mild left arm ache and a severe top/posterior headache.  EKG completed.  Vital signs recorded. MD notified.

## 2020-09-15 DIAGNOSIS — I10 Essential (primary) hypertension: Secondary | ICD-10-CM | POA: Diagnosis not present

## 2020-09-15 DIAGNOSIS — I442 Atrioventricular block, complete: Secondary | ICD-10-CM | POA: Diagnosis not present

## 2020-09-15 DIAGNOSIS — N179 Acute kidney failure, unspecified: Secondary | ICD-10-CM | POA: Diagnosis not present

## 2020-09-15 DIAGNOSIS — R079 Chest pain, unspecified: Secondary | ICD-10-CM | POA: Diagnosis not present

## 2020-09-15 DIAGNOSIS — R0789 Other chest pain: Secondary | ICD-10-CM | POA: Diagnosis not present

## 2020-09-15 DIAGNOSIS — I480 Paroxysmal atrial fibrillation: Secondary | ICD-10-CM | POA: Diagnosis not present

## 2020-09-15 LAB — BASIC METABOLIC PANEL WITH GFR
Anion gap: 8 (ref 5–15)
BUN: 29 mg/dL — ABNORMAL HIGH (ref 8–23)
CO2: 23 mmol/L (ref 22–32)
Calcium: 8.8 mg/dL — ABNORMAL LOW (ref 8.9–10.3)
Chloride: 102 mmol/L (ref 98–111)
Creatinine, Ser: 1.32 mg/dL — ABNORMAL HIGH (ref 0.44–1.00)
GFR, Estimated: 40 mL/min — ABNORMAL LOW
Glucose, Bld: 101 mg/dL — ABNORMAL HIGH (ref 70–99)
Potassium: 4 mmol/L (ref 3.5–5.1)
Sodium: 133 mmol/L — ABNORMAL LOW (ref 135–145)

## 2020-09-15 LAB — CBC WITH DIFFERENTIAL/PLATELET
Abs Immature Granulocytes: 0.01 10*3/uL (ref 0.00–0.07)
Basophils Absolute: 0 10*3/uL (ref 0.0–0.1)
Basophils Relative: 0 %
Eosinophils Absolute: 0.1 10*3/uL (ref 0.0–0.5)
Eosinophils Relative: 2 %
HCT: 37.7 % (ref 36.0–46.0)
Hemoglobin: 12.2 g/dL (ref 12.0–15.0)
Immature Granulocytes: 0 %
Lymphocytes Relative: 17 %
Lymphs Abs: 1 10*3/uL (ref 0.7–4.0)
MCH: 27.6 pg (ref 26.0–34.0)
MCHC: 32.4 g/dL (ref 30.0–36.0)
MCV: 85.3 fL (ref 80.0–100.0)
Monocytes Absolute: 0.7 10*3/uL (ref 0.1–1.0)
Monocytes Relative: 12 %
Neutro Abs: 4.1 10*3/uL (ref 1.7–7.7)
Neutrophils Relative %: 69 %
Platelets: 144 10*3/uL — ABNORMAL LOW (ref 150–400)
RBC: 4.42 MIL/uL (ref 3.87–5.11)
RDW: 12.3 % (ref 11.5–15.5)
WBC: 6 10*3/uL (ref 4.0–10.5)
nRBC: 0 % (ref 0.0–0.2)

## 2020-09-15 LAB — URINALYSIS, ROUTINE W REFLEX MICROSCOPIC
Bilirubin Urine: NEGATIVE
Glucose, UA: NEGATIVE mg/dL
Hgb urine dipstick: NEGATIVE
Ketones, ur: NEGATIVE mg/dL
Leukocytes,Ua: NEGATIVE
Nitrite: NEGATIVE
Protein, ur: NEGATIVE mg/dL
Specific Gravity, Urine: 1.013 (ref 1.005–1.030)
pH: 6 (ref 5.0–8.0)

## 2020-09-15 LAB — SODIUM, URINE, RANDOM: Sodium, Ur: <10 mmol/L

## 2020-09-15 LAB — CREATININE, URINE, RANDOM: Creatinine, Urine: 91.86 mg/dL

## 2020-09-15 MED ORDER — ZOLPIDEM TARTRATE 5 MG PO TABS: 5.0000 mg | ORAL_TABLET | Freq: Every evening | ORAL | Status: DC | PRN

## 2020-09-15 MED ORDER — SODIUM CHLORIDE 0.9 % IV SOLN: INTRAVENOUS | Status: DC

## 2020-09-15 MED ORDER — AMLODIPINE BESYLATE 5 MG PO TABS
2.5000 mg | ORAL_TABLET | Freq: Every day | ORAL | Status: DC
  Administered 2020-09-15: 2.5 mg via ORAL
  Filled 2020-09-15: qty 1

## 2020-09-15 MED ORDER — DIPHENHYDRAMINE HCL 25 MG PO CAPS
25.0000 mg | ORAL_CAPSULE | Freq: Every evening | ORAL | Status: DC | PRN
  Administered 2020-09-15 – 2020-09-16 (×2): 25 mg via ORAL
  Filled 2020-09-15 (×2): qty 1

## 2020-09-15 NOTE — Consult Note (Signed)
[facility] Consultation Note  Referring Provider: [facility] Primary Care Physician:  Carollee Herter, Alferd Apa, DO Primary Gastroenterologist:  Dr. Cristina Gong  Reason for Consultation:  Chest discomfort, eructation  HPI: [name] is a 80 y.o. female admitted ultimately to hospital for several day history of left sided chest discomfort/ache and left arm discomfort/ache.  She has some retrosternal burning and belching; symptoms worsen with eating, and improve after belching.  She has many-year (10+) year history of GERD, and has been on/off PPI over the years, but none recently.  No dysphagia, nausea/vomiting, blood in stool, hematemesis, unintentional weight loss.  Has had some epigastric discomfort.  History of gallstones.  Has had two prior endoscopies for similar symptoms in the past.   Past Medical History:  Diagnosis Date  . Atrioventricular block, complete ([place])    a. 08/2011 Upgrade of PPM to MDT Adapta L Dual Chamber PPM ser # [id number].  . Cardiac pacemaker in situ   . Contact dermatitis and eczema    unspec cause  . Dairy product intolerance   . Depression   . Diastolic dysfunction   . GERD (gastroesophageal reflux disease)   . Hiatal hernia   . History of diverticulitis of colon    2003  perforated diverticulitis w/ surgical intervention  . History of kidney stones    age 52  . History of peripheral edema    lower extremities  . History of thyroid nodule    multinodular goiter  s/p  total thyroidectomy 2014  . HTN (hypertension)   . Hydronephrosis, left   . Hypothyroidism   . Mild sleep apnea    per study 04-14-2014  . Nocturia   . NSVT (nonsustained ventricular tachycardia) ([place])   . Osteoporosis   . PAF (paroxysmal atrial fibrillation) ([place])   . PONV (postoperative nausea and vomiting)   . Pulmonary nodules    stable per last ct  . Renal cell carcinoma of left kidney (HCC)   . Superficial thrombophlebitis     Past Surgical History:   Procedure Laterality Date  . CARDIOVASCULAR STRESS TEST  04-20-2015  dr Caryl Comes   normal nuclear study/  no ischemia/  normal LV function and wall motion, ef 74%  . COLONOSCOPY  01/19/2013  . COLOSTOMY TAKEDOWN  07-22-2002  . CYSTOSCOPY WITH RETROGRADE PYELOGRAM, URETEROSCOPY AND STENT PLACEMENT Left 07/28/2015   Procedure: CYSTOSCOPY WITH LEFT RETROGRADE PYELOGRAM, POSSIBLE LEFT URETEROSCOPY AND STENT PLACEMENT;  Surgeon: Franchot Gallo, MD;  Location: [facility];  Service: Urology;  Laterality: Left;  . EXPLORATORY LAPARTOMY SIGMOID COLECTOMY/ COLOSTOMY  04-28-2002   perforated diverticulitis  . KYPHOPLASTY    . LAPAROSCOPY VENTRAL HERNIA REPAIR/  EXTENSIVE LYSIS ADHESIONS  05-04-2004  . PACEMAKER GENERATOR CHANGE Left 09/16/2011   MDT [place] pacemaker  . PACEMAKER PLACEMENT  05-11-2003     medtronic  . ROBOT ASSISTED LAPAROSCOPIC NEPHRECTOMY Left 03/20/2016   Procedure: XI ROBOTIC ASSISTED LAPAROSCOPIC RETROPERITONEAL NEPHRECTOMY;  Surgeon: Alexis Frock, MD;  Location: [facility];  Service: Urology;  Laterality: Left;  . THYROIDECTOMY N/A 08/07/2012   Procedure: THYROIDECTOMY;  Surgeon: Earnstine Regal, MD;  Location: [facility];  Service: General;  Laterality: N/A;  . TRANSTHORACIC ECHOCARDIOGRAM  10-12-2014   moderate focal basal LVH,  ef 55-60%,  grade 2 diastolic dysfunction, mild AV calcification without stenosis,  mild MR,  trivial PR    Prior to Admission medications   Medication Sig Start Date End Date Taking? Authorizing Provider  acetaminophen (TYLENOL) 500 MG tablet  Take 1,000 mg by mouth daily as needed for moderate pain.   Yes [provider]  amLODipine (NORVASC) 5 MG tablet Take 1 tablet (5 mg total) by mouth daily. 06/08/20  Yes [name], DO  apixaban (ELIQUIS) 5 MG TABS tablet Take 1 tablet (5 mg total) by mouth 2 (two) times daily. 06/28/20  Yes Deboraha Sprang, MD  Chlorphen-Phenyleph-APAP (CORICIDIN D COLD/FLU/SINUS) 2-5-325 MG TABS Take 2  tablets by mouth daily.   Yes [provider]  denosumab (PROLIA) 60 MG/ML SOSY injection Inject 60 mg into the skin every 6 (six) months.   Yes [provider]  diclofenac Sodium (VOLTAREN) 1 % GEL Apply 4 g topically 4 (four) times daily. 04/03/20  Yes Roma Schanz R, DO  diphenhydramine-acetaminophen (TYLENOL PM) 25-500 MG TABS tablet Take 2 tablets by mouth at bedtime as needed (sleep/pain).   Yes [provider]  fluticasone (FLONASE) 50 MCG/ACT nasal spray PLACE 2 SPRAYS INTO BOTH NOSTRILS DAILY AS NEEDED FOR ALLERGIES OR RHINITIS. 02/10/20  Yes Carollee Herter, Yvonne R, DO  hydrochlorothiazide (HYDRODIURIL) 12.5 MG tablet TAKE 1 TABLET BY MOUTH ONCE DAILY Patient taking differently: Take 12.5 mg by mouth daily. 07/10/20  Yes Roma Schanz R, DO  levocetirizine (XYZAL) 5 MG tablet Take 1 tablet (5 mg total) by mouth every evening. 05/19/20  Yes Roma Schanz R, DO  levothyroxine (SYNTHROID) 100 MCG tablet TAKE 1 TABLET BY MOUTH ONCE DAILY BEFORE BREAKFAST . Patient taking differently: Take 100 mcg by mouth daily before breakfast. TAKE 1 TABLET BY MOUTH ONCE DAILY BEFORE BREAKFAST . 12/23/19  Yes Lowne Chase, Yvonne R, DO  lisinopril (ZESTRIL) 40 MG tablet TAKE 1 TABLET (40 MG TOTAL) BY MOUTH DAILY. 07/10/20  Yes Roma Schanz R, DO  loratadine (CLARITIN) 10 MG tablet Take 1 tablet (10 mg total) by mouth daily as needed for allergies. 04/03/20  Yes Roma Schanz R, DO  tiZANidine (ZANAFLEX) 4 MG capsule Take 1 capsule (4 mg total) by mouth 3 (three) times daily as needed for muscle spasms. Patient not taking: No sig reported 04/03/20   [name], DO    Current Facility-Administered Medications  Medication Dose Route Frequency Provider Last Rate Last Admin  . 0.9 %  sodium chloride infusion   Intravenous Continuous Eugenie Filler, MD 75 mL/hr at 09/15/20 1042 New Bag at 09/15/20 1042  . acetaminophen (TYLENOL) tablet 650 mg  650  mg Oral Q4H PRN Lenore Cordia, MD   650 mg at 09/15/20 0942  . alum & mag hydroxide-simeth (MAALOX/MYLANTA) 200-200-20 MG/5ML suspension 30 mL  30 mL Oral Q6H PRN Eugenie Filler, MD   30 mL at 09/15/20 1057   And  . lidocaine (XYLOCAINE) 2 % viscous mouth solution 15 mL  15 mL Oral Q6H PRN Eugenie Filler, MD   15 mL at 09/14/20 1523  . amLODipine (NORVASC) tablet 2.5 mg  2.5 mg Oral Daily Fransico Him R, MD   2.5 mg at 09/15/20 0829  . apixaban (ELIQUIS) tablet 5 mg  5 mg Oral BID Lenore Cordia, MD   5 mg at 09/15/20 0829  . fluticasone (FLONASE) 50 MCG/ACT nasal spray 2 spray  2 spray Each Nare Daily Eugenie Filler, MD   2 spray at 09/15/20 0830  . levothyroxine (SYNTHROID) tablet 100 mcg  100 mcg Oral Q0600 Lenore Cordia, MD   100 mcg at 09/15/20 0554  . lisinopril (ZESTRIL) tablet 40  mg  40 mg Oral Daily Lenore Cordia, MD   40 mg at 09/15/20 0829  . loratadine (CLARITIN) tablet 10 mg  10 mg Oral Daily PRN Eugenie Filler, MD   10 mg at 09/14/20 1243  . metoprolol tartrate (LOPRESSOR) tablet 25 mg  25 mg Oral BID Joy, Shawn C, PA-C   25 mg at 09/15/20 0829  . morphine 2 MG/ML injection 1 mg  1 mg Intravenous Q4H PRN Eugenie Filler, MD      . nitroGLYCERIN (NITROSTAT) SL tablet 0.4 mg  0.4 mg Sublingual Q5 min PRN Lenore Cordia, MD      . ondansetron [facility]) injection 4 mg  4 mg Intravenous Q6H PRN Lenore Cordia, MD      . pantoprazole (PROTONIX) EC tablet 40 mg  40 mg Oral BID AC Eugenie Filler, MD   40 mg at 09/15/20 0350   Facility-Administered Medications Ordered in Other Encounters  Medication Dose Route Frequency Provider Last Rate Last Admin  . aminophylline injection 150 mg  150 mg Intravenous BID PRN Dorothy Spark, MD   150 mg at 04/20/15 1200    Allergies as of 09/13/2020 - Review Complete 09/13/2020  Allergen Reaction Noted  . Tape Rash 09/30/2011  . Codeine Nausea And Vomiting 01/23/2007    Family History  Problem Relation Age of  Onset  . Heart disease Mother        Stent in her 23s  . Stroke Mother   . Hypertension Mother   . Heart failure Father   . Hypertension Brother   . Heart disease Brother   . Diverticulosis Sister   . Hypertension Brother   . Cancer Son        breast  . Heart attack Neg Hx     Social History   Socioeconomic History  . Marital status: Single    Spouse name: Not on file  . Number of children: 2  . Years of education: 52  . Highest education level: Not on file  Occupational History  . 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 Smoking status: Former Smoker    Years: 15.00    Types: Cigarettes    Quit date: 10/06/1979    Years since quitting: 40.9  . Smokeless tobacco: Never Used  Substance and Sexual Activity  . Alcohol use: No  . Drug use: No  . Sexual activity: Not on file  Other Topics Concern  . 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  She was married for 22 years, divorced, and has remained single. She has 1 grown son, 1 grown daughter.  She has 3 grandchildren, 2 of her children   live in [place].  She is retired.  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   Social Determinants of Health   Financial Resource Strain: Not on file  Food Insecurity: Not on file  Transportation Needs: Not on file  Physical Activity: Not on file  Stress: Not on file  Social Connections: Not on file  Intimate Partner Violence: Not on file    Review of Systems: As per HPI, all others negative  Physical Exam: Vital signs in last 24 hours: Temp:  [  97.8 F (36.6 C)-98.4 F (36.9 C)] 97.8 F (36.6 C) (03/18 1104) Pulse Rate:  [56-73] 61 (03/18 1104) Resp:  [14-20] 20 (03/18 1104) BP: (113-150)/(48-76) 150/74 (03/18 1104) SpO2:   [95 %-100 %] 100 % (03/18 1104) Last BM Date: 09/13/20 General:   Alert,  Well-developed, well-nourished, pleasant and cooperative in NAD Head:  Normocephalic and atraumatic. Eyes:  Sclera clear, no icterus.   Conjunctiva pink. Ears:  Normal auditory acuity. Nose:  No deformity, discharge,  or lesions. Mouth:  No deformity or lesions.  Oropharynx pink & moist. Neck:  Supple; no masses or thyromegaly. Abdomen:  Soft, nontender and nondistended. No masses, hepatosplenomegaly or hernias noted. Normal bowel sounds, without guarding, and without rebound.     Msk:  Symmetrical without gross deformities. Normal posture. Pulses:  Normal pulses noted. Extremities:  Without clubbing or edema. Neurologic:  Alert and  oriented x4; diffusely weak, otherwise grossly normal neurologically. Skin:  Intact without significant lesions or rashes. Psych:  Alert and cooperative. Normal mood and affect.   Lab Results: Recent Labs    09/13/20 1208 09/14/20 0128  WBC 5.4 5.4  HGB 13.1 12.0  HCT 40.2 35.3*  PLT 147* 144*   BMET Recent Labs    09/13/20 1208 09/14/20 0128 09/15/20 0128  NA 136 136 133*  K 3.4* 4.1 4.0  CL 99 104 102  CO2 26 25 23   GLUCOSE 99 102* 101*  BUN 18 19 29*  CREATININE 1.01* 1.13* 1.32*  CALCIUM 9.9 9.3 8.8*   LFT No results for input(s): PROT, ALBUMIN, AST, ALT, ALKPHOS, BILITOT, BILIDIR, IBILI in the last 72 hours. PT/INR No results for input(s): LABPROT, INR in the last 72 hours.  Studies/Results: DG Chest 2 View  Result Date: 09/13/2020 CLINICAL DATA:  Chest pain. Additional history provided: Chest pain and left arm pain since Sunday, worsening last night, dizziness when standing. EXAM: CHEST - 2 VIEW COMPARISON:  CT chest 03/16/2018.  Chest radiographs 07/30/2016. FINDINGS: Redemonstrated left chest dual lead pacer device. Heart size within normal limits. Aortic atherosclerosis. No appreciable airspace consolidation. No evidence of pleural effusion or  pneumothorax. No acute bony abnormality is identified. Surgical clips within the lower neck. Multiple previously augmented vertebral compression fractures within the thoracic and lumbar spine. IMPRESSION: No evidence of acute cardiopulmonary abnormality. Aortic Atherosclerosis (ICD10-I70.0). Electronically Signed   By: Kyle  Golden DO   On: 09/13/2020 12:48   US Abdomen Complete  Result Date: 09/15/2020 CLINICAL DATA:  Abdominal pain EXAM: ABDOMEN ULTRASOUND COMPLETE COMPARISON:  Chest x-ray 09/13/2020, CT 07/12/2015 FINDINGS: Gallbladder: Small gallstone. Normal wall thickness. Negative sonographic Murphy Common bile duct: Diameter: 2.2 mm Liver: No focal lesion identified. Within normal limits in parenchymal echogenicity. Portal vein is patent on color Doppler imaging with normal direction of blood flow towards the liver. IVC: No abnormality visualized. Pancreas: Visualized portion unremarkable. Spleen: Upper normal in size. Right Kidney: Length: 10.7 cm. Echogenicity within normal limits. No mass or hydronephrosis visualized. Left Kidney: Length: Surgically absent. Abdominal aorta: No aneurysm visualized. Other findings: None. IMPRESSION: 1. Status post left nephrectomy. 2. Cholelithiasis without sonographic evidence for acute cholecystitis Electronically Signed   By: Kim  Fujinaga M.D.   On: 09/15/2020 00:10   ECHOCARDIOGRAM COMPLETE  Result Date: 09/14/2020    ECHOCARDIOGRAM REPORT   Patient Name:   [name] Date of Exam: 09/14/2020 Medical Rec #:  [id number]       Height:       63 .0 in Accession #:  [id number]      Weight:       145.9 lb Date of Birth:  1940/08/18        BSA:          1.691 m Patient Age:    33 years        BP:           126/53 mmHg Patient Gender: F               HR:           63 bpm. Exam Location:  Inpatient Procedure: 2D Echo, Cardiac Doppler and Color Doppler Indications:     R07.9* Chest pain, unspecified  History:         Patient has prior history of Echocardiogram  examinations, most                  recent 10/12/2014.  Sonographer:     Merrie Roof RDCS Referring Phys:  [id number] Cleaster Corin PATEL Diagnosing Phys: Mertie Moores MD IMPRESSIONS  1. Left ventricular ejection fraction, by estimation, is 55 to 60%. The left ventricle has normal function. The left ventricle has no regional wall motion abnormalities. There is mild asymmetric left ventricular hypertrophy. Left ventricular diastolic parameters are consistent with Grade II diastolic dysfunction (pseudonormalization).  2. Right ventricular systolic function is normal. The right ventricular size is normal. There is normal pulmonary artery systolic pressure.  3. The mitral valve is grossly normal. Mild mitral valve regurgitation.  4. Tricuspid valve regurgitation is mild to moderate.  5. The aortic valve is tricuspid. Aortic valve regurgitation is not visualized. FINDINGS  Left Ventricle: Left ventricular ejection fraction, by estimation, is 55 to 60%. The left ventricle has normal function. The left ventricle has no regional wall motion abnormalities. The left ventricular internal cavity size was normal in size. There is  mild asymmetric left ventricular hypertrophy. Left ventricular diastolic parameters are consistent with Grade II diastolic dysfunction (pseudonormalization). Right Ventricle: The right ventricular size is normal. No increase in right ventricular wall thickness. Right ventricular systolic function is normal. There is normal pulmonary artery systolic pressure. The tricuspid regurgitant velocity is 2.65 m/s, and  with an assumed right atrial pressure of 3 mmHg, the estimated right ventricular systolic pressure is 27.0 mmHg. Left Atrium: Left atrial size was normal in size. Right Atrium: Right atrial size was normal in size. Pericardium: There is no evidence of pericardial effusion. Mitral Valve: The mitral valve is grossly normal. Mild mitral valve regurgitation. Tricuspid Valve: The tricuspid valve is grossly  normal. Tricuspid valve regurgitation is mild to moderate. Aortic Valve: The aortic valve is tricuspid. Aortic valve regurgitation is not visualized. Aortic valve mean gradient measures 3.0 mmHg. Aortic valve peak gradient measures 6.5 mmHg. Aortic valve area, by VTI measures 2.65 cm. Pulmonic Valve: The pulmonic valve was normal in structure. Pulmonic valve regurgitation is not visualized. Aorta: The aortic root and ascending aorta are structurally normal, with no evidence of dilitation. IAS/Shunts: The atrial septum is grossly normal. Additional Comments: A device lead is visualized.  LEFT VENTRICLE PLAX 2D LVIDd:         4.10 cm  Diastology LVIDs:         2.80 cm  LV e' medial:    6.85 cm/s LV PW:         0.80 cm  LV E/e' medial:  12.0 LV IVS:        1.20 cm  LV e' lateral:   10.20 cm/s  LVOT diam:     1.80 cm  LV E/e' lateral: 8.1 LV SV:         73 LV SV Index:   43 LVOT Area:     2.54 cm  RIGHT VENTRICLE          IVC RV Basal diam:  3.20 cm  IVC diam: 1.70 cm LEFT ATRIUM             Index       RIGHT ATRIUM           Index LA diam:        3.60 cm 2.13 cm/m  RA Area:     15.10 cm LA Vol (A2C):   53.5 ml 31.63 ml/m RA Volume:   42.90 ml  25.37 ml/m LA Vol (A4C):   26.3 ml 15.55 ml/m LA Biplane Vol: 38.6 ml 22.82 ml/m  AORTIC VALVE AV Area (Vmax):    2.46 cm AV Area (Vmean):   2.77 cm AV Area (VTI):     2.65 cm AV Vmax:           127.00 cm/s AV Vmean:          86.200 cm/s AV VTI:            0.275 m AV Peak Grad:      6.5 mmHg AV Mean Grad:      3.0 mmHg LVOT Vmax:         123.00 cm/s LVOT Vmean:        94.000 cm/s LVOT VTI:          0.286 m LVOT/AV VTI ratio: 1.04  AORTA Ao Root diam: 2.90 cm Ao Asc diam:  3.80 cm MITRAL VALVE               TRICUSPID VALVE MV Area (PHT): 4.60 cm    TR Peak grad:   28.1 mmHg MV Decel Time: 165 msec    TR Vmax:        265.00 cm/s MV E velocity: 82.30 cm/s MV A velocity: 90.40 cm/s  SHUNTS MV E/A ratio:  0.91        Systemic VTI:  0.29 m                            Systemic  Diam: 1.80 cm Mertie Moores MD Electronically signed by Mertie Moores MD Signature Date/Time: 09/14/2020/12:36:32 PM    Final (Updated)     Impression:   1.  Chest discomfort.  Suspect at least a component of reflux.   Specifically, chest discomfort improved with belching, worse after eating, highly consistent with GERD. 2.  Left arm discomfort. 3.  Gallstones.  Doubt cause of symptoms.  Plan:  1.   Pantoprazole 40 mg po bid x 6 weeks, then 40 mg po qd indefinitely. 2.  If renal function improves, could consider short-term (~ 3-4 weeks) of sucralfate suspension 1 gram po QID. 3.  Given classic symptoms, prior endoscopies for similar symptoms, and no alarm signs, I do not see need for endoscopic evaluation. 4.  Agree with cardiac work-up. 5.  Nothing further to add from GI perspective. 6.  [facility] will sign-off; we will arrange outpatient follow-up; thank you for the consultation; please call with any further questions.   LOS: 1 day   Kierre Hintz M  09/15/2020, 11:21 AM  Cell [phone] If no answer or after 5 PM call [phone]

## 2020-09-15 NOTE — Progress Notes (Addendum)
Progress Note  Patient Name: [name] Date of Encounter: 09/15/2020  [facility] Cardiologist: Virl Axe, MD **  Subjective   No further CP since yesterday. Resolved with GI cocktail  Inpatient Medications    Scheduled Meds: . apixaban  5 mg Oral BID  . fluticasone  2 spray Each Nare Daily  . levothyroxine  100 mcg Oral Q0600  . lisinopril  40 mg Oral Daily  . metoprolol tartrate  25 mg Oral BID  . pantoprazole  40 mg Oral BID AC   Continuous Infusions:  PRN Meds: acetaminophen, alum & mag hydroxide-simeth **AND** lidocaine, loratadine, morphine injection, nitroGLYCERIN, ondansetron (ZOFRAN) IV   Vital Signs    Vitals:   09/14/20 1945 09/14/20 2110 09/14/20 2317 09/15/20 0358  BP: 113/62 (!) 121/48 (!) 150/76 (!) 147/65  Pulse: 65 73 66 61  Resp: 17  18 16   Temp: 97.8 F (36.6 C)  98 F (36.7 C) 98 F (36.7 C)  TempSrc: Oral  Oral Oral  SpO2: 95%  98% 96%  Weight:      Height:       No intake or output data in the 24 hours ending 09/15/20 0741 Last 3 Weights 09/13/2020 09/13/2020 09/13/2020  Weight (lbs) 145 lb 15.1 oz 147 lb 147 lb 3.2 oz  Weight (kg) 66.2 kg 66.679 kg 66.769 kg      Telemetry    NSR - Personally Reviewed  ECG    No new EKG to review- Personally Reviewed  Physical Exam   GEN: No acute distress.   Neck: No JVD Cardiac: RRR, no murmurs, rubs, or gallops.  Respiratory: Clear to auscultation bilaterally. GI: Soft, nontender, non-distended  MS: No edema; No deformity. Neuro:  Nonfocal  Psych: Normal affect   Labs    High Sensitivity Troponin:   Recent Labs  Lab 09/13/20 1208 09/13/20 1446  TROPONINIHS 12 14      Chemistry Recent Labs  Lab 09/13/20 1208 09/14/20 0128 09/15/20 0128  NA 136 136 133*  K 3.4* 4.1 4.0  CL 99 104 102  CO2 26 25 23   GLUCOSE 99 102* 101*  BUN 18 19 29*  CREATININE 1.01* 1.13* 1.32*  CALCIUM 9.9 9.3 8.8*  GFRNONAA 56* 49* 40*  ANIONGAP 11 7 8      Hematology Recent Labs   Lab 09/13/20 1208 09/14/20 0128  WBC 5.4 5.4  RBC 4.75 4.23  HGB 13.1 12.0  HCT 40.2 35.3*  MCV 84.6 83.5  MCH 27.6 28.4  MCHC 32.6 34.0  RDW 12.5 12.4  PLT 147* 144*    BNP Recent Labs  Lab 09/13/20 1208  BNP 226.9*     DDimer No results for input(s): DDIMER in the last 168 hours.   CHA2DS2-VASc Score = 6   Radiology    DG Chest 2 View  Result Date: 09/13/2020 CLINICAL DATA:  Chest pain. Additional history provided: Chest pain and left arm pain since Sunday, worsening last night, dizziness when standing. EXAM: CHEST - 2 VIEW COMPARISON:  CT chest 03/16/2018.  Chest radiographs 07/30/2016. FINDINGS: Redemonstrated left chest dual lead pacer device. Heart size within normal limits. Aortic atherosclerosis. No appreciable airspace consolidation. No evidence of pleural effusion or pneumothorax. No acute bony abnormality is identified. Surgical clips within the lower neck. Multiple previously augmented vertebral compression fractures within the thoracic and lumbar spine. IMPRESSION: No evidence of acute cardiopulmonary abnormality. Aortic Atherosclerosis (ICD10-I70.0). Electronically Signed   By: Kellie Simmering DO   On: 09/13/2020 12:48  US Abdomen Complete  Result Date: 09/15/2020 CLINICAL DATA:  Abdominal pain EXAM: ABDOMEN ULTRASOUND COMPLETE COMPARISON:  Chest x-ray 09/13/2020, CT 07/12/2015 FINDINGS: Gallbladder: Small gallstone. Normal wall thickness. Negative sonographic Murphy Common bile duct: Diameter: 2.2 mm Liver: No focal lesion identified. Within normal limits in parenchymal echogenicity. Portal vein is patent on color Doppler imaging with normal direction of blood flow towards the liver. IVC: No abnormality visualized. Pancreas: Visualized portion unremarkable. Spleen: Upper normal in size. Right Kidney: Length: 10.7 cm. Echogenicity within normal limits. No mass or hydronephrosis visualized. Left Kidney: Length: Surgically absent. Abdominal aorta: No aneurysm visualized.  Other findings: None. IMPRESSION: 1. Status post left nephrectomy. 2. Cholelithiasis without sonographic evidence for acute cholecystitis Electronically Signed   By: Donavan Foil M.D.   On: 09/15/2020 00:10   ECHOCARDIOGRAM COMPLETE  Result Date: 09/14/2020    ECHOCARDIOGRAM REPORT   Patient Name:   [name] Dossantos Date of Exam: 09/14/2020 Medical Rec #:  [id number]       Height:       63.0 in Accession #:    [id number]      Weight:       145.9 lb Date of Birth:  1941-02-23        BSA:          1.691 m Patient Age:    80 years        BP:           126/53 mmHg Patient Gender: F               HR:           63 bpm. Exam Location:  Inpatient Procedure: 2D Echo, Cardiac Doppler and Color Doppler Indications:     R07.9* Chest pain, unspecified  History:         Patient has prior history of Echocardiogram examinations, most                  recent 10/12/2014.  Sonographer:     Merrie Roof RDCS Referring Phys:  [id number] Cleaster Corin PATEL Diagnosing Phys: Mertie Moores MD IMPRESSIONS  1. Left ventricular ejection fraction, by estimation, is 55 to 60%. The left ventricle has normal function. The left ventricle has no regional wall motion abnormalities. There is mild asymmetric left ventricular hypertrophy. Left ventricular diastolic parameters are consistent with Grade II diastolic dysfunction (pseudonormalization).  2. Right ventricular systolic function is normal. The right ventricular size is normal. There is normal pulmonary artery systolic pressure.  3. The mitral valve is grossly normal. Mild mitral valve regurgitation.  4. Tricuspid valve regurgitation is mild to moderate.  5. The aortic valve is tricuspid. Aortic valve regurgitation is not visualized. FINDINGS  Left Ventricle: Left ventricular ejection fraction, by estimation, is 55 to 60%. The left ventricle has normal function. The left ventricle has no regional wall motion abnormalities. The left ventricular internal cavity size was normal in size. There is  mild  asymmetric left ventricular hypertrophy. Left ventricular diastolic parameters are consistent with Grade II diastolic dysfunction (pseudonormalization). Right Ventricle: The right ventricular size is normal. No increase in right ventricular wall thickness. Right ventricular systolic function is normal. There is normal pulmonary artery systolic pressure. The tricuspid regurgitant velocity is 2.65 m/s, and  with an assumed right atrial pressure of 3 mmHg, the estimated right ventricular systolic pressure is 24.8 mmHg. Left Atrium: Left atrial size was normal in size. Right Atrium: Right atrial size was normal in size. Pericardium: There is  no evidence of pericardial effusion. Mitral Valve: The mitral valve is grossly normal. Mild mitral valve regurgitation. Tricuspid Valve: The tricuspid valve is grossly normal. Tricuspid valve regurgitation is mild to moderate. Aortic Valve: The aortic valve is tricuspid. Aortic valve regurgitation is not visualized. Aortic valve mean gradient measures 3.0 mmHg. Aortic valve peak gradient measures 6.5 mmHg. Aortic valve area, by VTI measures 2.65 cm. Pulmonic Valve: The pulmonic valve was normal in structure. Pulmonic valve regurgitation is not visualized. Aorta: The aortic root and ascending aorta are structurally normal, with no evidence of dilitation. IAS/Shunts: The atrial septum is grossly normal. Additional Comments: A device lead is visualized.  LEFT VENTRICLE PLAX 2D LVIDd:         4.10 cm  Diastology LVIDs:         2.80 cm  LV e' medial:    6.85 cm/s LV PW:         0.80 cm  LV E/e' medial:  12.0 LV IVS:        1.20 cm  LV e' lateral:   10.20 cm/s LVOT diam:     1.80 cm  LV E/e' lateral: 8.1 LV SV:         73 LV SV Index:   43 LVOT Area:     2.54 cm  RIGHT VENTRICLE          IVC RV Basal diam:  3.20 cm  IVC diam: 1.70 cm LEFT ATRIUM             Index       RIGHT ATRIUM           Index LA diam:        3.60 cm 2.13 cm/m  RA Area:     15.10 cm LA Vol (A2C):   53.5 ml 31.63  ml/m RA Volume:   42.90 ml  25.37 ml/m LA Vol (A4C):   26.3 ml 15.55 ml/m LA Biplane Vol: 38.6 ml 22.82 ml/m  AORTIC VALVE AV Area (Vmax):    2.46 cm AV Area (Vmean):   2.77 cm AV Area (VTI):     2.65 cm AV Vmax:           127.00 cm/s AV Vmean:          86.200 cm/s AV VTI:            0.275 m AV Peak Grad:      6.5 mmHg AV Mean Grad:      3.0 mmHg LVOT Vmax:         123.00 cm/s LVOT Vmean:        94.000 cm/s LVOT VTI:          0.286 m LVOT/AV VTI ratio: 1.04  AORTA Ao Root diam: 2.90 cm Ao Asc diam:  3.80 cm MITRAL VALVE               TRICUSPID VALVE MV Area (PHT): 4.60 cm    TR Peak grad:   28.1 mmHg MV Decel Time: 165 msec    TR Vmax:        265.00 cm/s MV E velocity: 82.30 cm/s MV A velocity: 90.40 cm/s  SHUNTS MV E/A ratio:  0.91        Systemic VTI:  0.29 m                            Systemic Diam: 1.80 cm Mertie Moores MD Electronically signed by Arnette Norris  Nahser MD Signature Date/Time: 09/14/2020/12:36:32 PM    Final (Updated)     Cardiac Studies   2D echo IMPRESSIONS   1. Left ventricular ejection fraction, by estimation, is 55 to 60%. The  left ventricle has normal function. The left ventricle has no regional  wall motion abnormalities. There is mild asymmetric left ventricular  hypertrophy. Left ventricular diastolic  parameters are consistent with Grade II diastolic dysfunction  (pseudonormalization).  2. Right ventricular systolic function is normal. The right ventricular  size is normal. There is normal pulmonary artery systolic pressure.  3. The mitral valve is grossly normal. Mild mitral valve regurgitation.  4. Tricuspid valve regurgitation is mild to moderate.  5. The aortic valve is tricuspid. Aortic valve regurgitation is not  visualized.   Patient Profile     80 y.o. 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 Grandville Silos.  Assessment & Plan    1. Chest pain with mixed features, also recent GI illness - troponins negative - EKG paced so challenging to assess - chest pain relieved with GI measures, not specifically exertiona - 2D echo with normal LVF and no focal wall motion abnormalities - general feeling is that this is more likely GI related as sx resolve with GI cocktail and belching but she also has left arm aching that is completely different from her GI symptoms - Abd US showed gallstones but would not explain left arm pain - given her bump in SCr and only having a single kidney will avoid coronary CTA - make NPO after MN for Leixscan myoview in am  2. Paroxysmal atrial fibrillation (also prior h/o PPM) - chest pain does not appear to be related to arrhythmia as she has been maintaining NSR - on Eliquis PTA which has been continued here  4. Probable CKD stage IIIa - AKI with Scr bumped to 1.32 today ? Etiology -per TRH  5. Essential HTN - Bp elevated this am at 147/26mmHg - home regimen: lisinopril 40mg , amlodipine 5mg , HCTZ 12.5mg  - current regimen: lisinopril 40mg , metoprolol 25mg  BID  - diuretic on hold due to bump in SCr - cannot titrate BB further due to HR in low 60's - restart amlodipine at 2.5mg  daily      For questions or updates, please contact [place] Please consult [email or web address] for contact info under        Signed, Fransico Him, MD  09/15/2020, 7:41 AM

## 2020-09-15 NOTE — Progress Notes (Signed)
PROGRESS NOTE    Daisy CROCKETT  ERD:[id number] DOB: 1941-01-14 DOA: 09/13/2020 PCP: [name], DO    Chief Complaint  Patient presents with  . Chest Pain    Brief Narrative:  Patient is a 80 year old female history of paroxysmal A. fib on Eliquis, CHB status post PPM, hypertension, hyperlipidemia, hypothyroidism presented to the ED with substernal chest pain radiating to the left jaw left shoulder left upper extremity with arm pain described as a aching pain.  Patient also states some improvement with chest pain with belching.  Patient endorses shortness of breath when she walks up a flight of stairs which is new onset. Patient seen in the ED admitted for chest pain rule out.  Cardiac enzymes negative x3.  2D echo pending.  Chest x-ray unremarkable.  Cardiology consultation pending for evaluation for inpatient versus outpatient invasive work-up.   Assessment & Plan:   Principal Problem:   Chest pain Active Problems:   Essential hypertension   AV BLOCK, COMPLETE   Hypothyroidism, postsurgical   Paroxysmal atrial fibrillation (HCC)   Congestion of nasal sinus   AKI (acute kidney injury) ([place])   Left-sided chest pain  #1 chest pain/left upper extremity pain Patient presented with chest pain with both typical and atypical features as well as left upper extremity aching pain.  High-sensitivity troponin negative x2.  EKG with with right bundle branch block, paced rhythm with no acute ischemic changes noted.  Patient low risk for PE by Wells and modified Geneva score with adherence to Eliquis. -Patient with multiple cardiac risk factors. -2D echo with a EF of 55 to 60%, no wall motion abnormalities, mild asymmetric left ventricular hypertrophy, grade 2 diastolic dysfunction, mild mitral valve regurgitation, mild to moderate tricuspid valvular regurgitation.   -Abdominal ultrasound done with cholelithiasis without sonographic evidence of acute cholecystitis, status post left  nephrectomy.   -Patient still with left upper extremity aching pain.   -Patient states chest discomfort improved with GI cocktail however develops belching and chest discomfort whenever she eats.   -Continue PPI twice daily.   -Patient for Myoview Lexiscan scan tomorrow per cardiology for further evaluation of left upper extremity aching pain however chest discomfort likely GI related.   -Chest discomfort likely GI related and as such we will consult with GI for further evaluation.  Patient with history of gastric polyp ( egd 12/2012).   -Continue PPI twice daily.   -GI cocktail as needed.   -Continue Lopressor, lisinopril. -Cardiology following and appreciate input and recommendations.   2.  Hypothyroidism Synthroid.  3.  Hypertension Continue Lopressor and lisinopril.  Norvasc restarted at a low dose this morning per cardiology.  Continue to hold HCTZ with a bump in creatinine.  Follow.   4.  Paroxysmal atrial fibrillation/history of CHB status post PPM Rate controlled on Lopressor.  Patient status post PPM.  Eliquis for anticoagulation.    5.  Hypokalemia Likely secondary to HCTZ.  Repleted.  Follow.  6.  Congestion of nasal sinus Patient with complaints of nasal congestion and probable sinus headache.  Continue Flonase, as needed Reglan and morphine for significant headache.  7.  Acute kidney injury/status post left nephrectomy Patient noted to have a bump in the creatinine up to 1.32 today with elevated BUN. Likely secondary to a prerenal azotemia as patient noted to be on diuretics prior to admission.  Check a UA with cultures and sensitivities.  Check urine sodium, urine creatinine.  Abdominal ultrasound negative for hydronephrosis, surgically absent left  kidney.  Place on gentle hydration of normal saline 75 cc an hour for the next 24 hours. Follow-up    DVT prophylaxis: Eliquis Code Status: Full Family Communication: Updated patient.  No family at bedside. Disposition:    Status is: Observation    Dispo: The patient is from: Home              Anticipated d/c is to: Home              Patient currently scheduled for [facility] study tomorrow.  Not stable for discharge.    Difficult to place patient no       Consultants:   Cardiology Dr. Radford Pax 09/14/2020  Procedures:   2D echo 09/14/2020  Chest x-ray 09/13/2020  Abdominal ultrasound 17 2022  Antimicrobials:  None   Subjective: Sitting up in chair, just ambulated in hallway.  Stated chest pain improved with GI cocktail.  States chest pain/discomfort occurred whenever she eats.  States has some belching associated with this.   Still with complaints of left upper extremity aching pain.   Denies any shortness of breath.  No abdominal pain.  Good urine output.   Objective: Vitals:   09/14/20 2317 09/15/20 0358 09/15/20 0816 09/15/20 1104  BP: (!) 150/76 (!) 147/65 138/67 (!) 150/74  Pulse: 66 61 69 61  Resp: 18 16 20 20   Temp: 98 F (36.7 C) 98 F (36.7 C) 97.8 F (36.6 C) 97.8 F (36.6 C)  TempSrc: Oral Oral Oral Oral  SpO2: 98% 96% 98% 100%  Weight:      Height:        Intake/Output Summary (Last 24 hours) at 09/15/2020 1122 Last data filed at 09/15/2020 0817 Gross per 24 hour  Intake 120 ml  Output --  Net 120 ml   Filed Weights   09/13/20 1158 09/13/20 1700  Weight: 66.7 kg 66.2 kg    Examination:  General exam: : NAD Respiratory system: CTA B.  No wheezes, no rhonchi.  Speaking in full sentences.  Normal respiratory effort. Cardiovascular system: Regular rate and rhythm no murmurs rubs or gallops.  No JVD.  No lower extremity edema.  Gastrointestinal system: Abdomen soft, nontender, nondistended, positive bowel sounds.  No rebound.  No guarding. Central nervous system: Alert and oriented. No focal neurological deficits. Extremities: Symmetric 5 x 5 power. Skin: No rashes, lesions or ulcers Psychiatry: Judgement and insight appear normal. Mood & affect  appropriate.  Data Reviewed: I have personally reviewed following labs and imaging studies  CBC: Recent Labs  Lab 09/13/20 1208 09/14/20 0128  WBC 5.4 5.4  HGB 13.1 12.0  HCT 40.2 35.3*  MCV 84.6 83.5  PLT 147* 144*    Basic Metabolic Panel: Recent Labs  Lab 09/13/20 1208 09/14/20 0128 09/15/20 0128  NA 136 136 133*  K 3.4* 4.1 4.0  CL 99 104 102  CO2 26 25 23   GLUCOSE 99 102* 101*  BUN 18 19 29*  CREATININE 1.01* 1.13* 1.32*  CALCIUM 9.9 9.3 8.8*    GFR: Estimated Creatinine Clearance: 30 mL/min (A) (by C-G formula based on SCr of 1.32 mg/dL (H)).  Liver Function Tests: No results for input(s): AST, ALT, ALKPHOS, BILITOT, PROT, ALBUMIN in the last 168 hours.  CBG: No results for input(s): GLUCAP in the last 168 hours.   Recent Results (from the past 240 hour(s))  SARS CORONAVIRUS 2 (TAT 6-24 HRS) Nasopharyngeal Nasopharyngeal Swab     Status: None   Collection Time: 09/13/20  2:03  PM   Specimen: Nasopharyngeal Swab  Result Value Ref Range Status   SARS Coronavirus 2 NEGATIVE NEGATIVE Final    Comment: (NOTE) SARS-CoV-2 target nucleic acids are NOT DETECTED.  The SARS-CoV-2 RNA is generally detectable in upper and lower respiratory specimens during the acute phase of infection. Negative results do not preclude SARS-CoV-2 infection, do not rule out co-infections with other pathogens, and should not be used as the sole basis for treatment or other patient management decisions. Negative results must be combined with clinical observations, patient history, and epidemiological information. The expected result is Negative.  Fact Sheet for Patients: SugarRoll.be  Fact Sheet for Healthcare Providers: https://www.woods-mathews.com/  This test is not yet approved or cleared by the [place] FDA and  has been authorized for detection and/or diagnosis of SARS-CoV-2 by FDA under an Emergency Use Authorization (EUA).  This EUA will remain  in effect (meaning this test can be used) for the duration of the COVID-19 declaration under Se ction 564(b)(1) of the Act, 21 U.S.C. section 360bbb-3(b)(1), unless the authorization is terminated or revoked sooner.  Performed at [facility], [place] [address], [place], [place] [address]          Radiology Studies: DG Chest 2 View  Result Date: 09/13/2020 CLINICAL DATA:  Chest pain. Additional history provided: Chest pain and left arm pain since Sunday, worsening last night, dizziness when standing. EXAM: CHEST - 2 VIEW COMPARISON:  CT chest 03/16/2018.  Chest radiographs 07/30/2016. FINDINGS: Redemonstrated left chest dual lead pacer device. Heart size within normal limits. Aortic atherosclerosis. No appreciable airspace consolidation. No evidence of pleural effusion or pneumothorax. No acute bony abnormality is identified. Surgical clips within the lower neck. Multiple previously augmented vertebral compression fractures within the thoracic and lumbar spine. IMPRESSION: No evidence of acute cardiopulmonary abnormality. Aortic Atherosclerosis (ICD10-I70.0). Electronically Signed   By: Kyle  Golden DO   On: 09/13/2020 12:48   US Abdomen Complete  Result Date: 09/15/2020 CLINICAL DATA:  Abdominal pain EXAM: ABDOMEN ULTRASOUND COMPLETE COMPARISON:  Chest x-ray 09/13/2020, CT 07/12/2015 FINDINGS: Gallbladder: Small gallstone. Normal wall thickness. Negative sonographic Murphy Common bile duct: Diameter: 2.2 mm Liver: No focal lesion identified. Within normal limits in parenchymal echogenicity. Portal vein is patent on color Doppler imaging with normal direction of blood flow towards the liver. IVC: No abnormality visualized. Pancreas: Visualized portion unremarkable. Spleen: Upper normal in size. Right Kidney: Length: 10.7 cm. Echogenicity within normal limits. No mass or hydronephrosis visualized. Left Kidney: Length: Surgically absent. Abdominal aorta: No aneurysm  visualized. Other findings: None. IMPRESSION: 1. Status post left nephrectomy. 2. Cholelithiasis without sonographic evidence for acute cholecystitis Electronically Signed   By: Kim  Fujinaga M.D.   On: 09/15/2020 00:10   ECHOCARDIOGRAM COMPLETE  Result Date: 09/14/2020    ECHOCARDIOGRAM REPORT   Patient Name:   Kiyanna M Schlitt Date of Exam: 09/14/2020 Medical Rec #:  [id number]       Height:       63.0 in Accession #:    [id number]      Weight:       145.9 lb Date of Birth:  11/22/1940        BSA:          1.691 m Patient Age:    80 years        BP:           12 6/53 mmHg Patient Gender: F  HR:           63 bpm. Exam Location:  Inpatient Procedure: 2D Echo, Cardiac Doppler and Color Doppler Indications:     R07.9* Chest pain, unspecified  History:         Patient has prior history of Echocardiogram examinations, most                  recent 10/12/2014.  Sonographer:     Merrie Roof RDCS Referring Phys:  [id number] Cleaster Corin PATEL Diagnosing Phys: Mertie Moores MD IMPRESSIONS  1. Left ventricular ejection fraction, by estimation, is 55 to 60%. The left ventricle has normal function. The left ventricle has no regional wall motion abnormalities. There is mild asymmetric left ventricular hypertrophy. Left ventricular diastolic parameters are consistent with Grade II diastolic dysfunction (pseudonormalization).  2. Right ventricular systolic function is normal. The right ventricular size is normal. There is normal pulmonary artery systolic pressure.  3. The mitral valve is grossly normal. Mild mitral valve regurgitation.  4. Tricuspid valve regurgitation is mild to moderate.  5. The aortic valve is tricuspid. Aortic valve regurgitation is not visualized. FINDINGS  Left Ventricle: Left ventricular ejection fraction, by estimation, is 55 to 60%. The left ventricle has normal function. The left ventricle has no regional wall motion abnormalities. The left ventricular internal cavity size was normal in size. There  is  mild asymmetric left ventricular hypertrophy. Left ventricular diastolic parameters are consistent with Grade II diastolic dysfunction (pseudonormalization). Right Ventricle: The right ventricular size is normal. No increase in right ventricular wall thickness. Right ventricular systolic function is normal. There is normal pulmonary artery systolic pressure. The tricuspid regurgitant velocity is 2.65 m/s, and  with an assumed right atrial pressure of 3 mmHg, the estimated right ventricular systolic pressure is 17.6 mmHg. Left Atrium: Left atrial size was normal in size. Right Atrium: Right atrial size was normal in size. Pericardium: There is no evidence of pericardial effusion. Mitral Valve: The mitral valve is grossly normal. Mild mitral valve regurgitation. Tricuspid Valve: The tricuspid valve is grossly normal. Tricuspid valve regurgitation is mild to moderate. Aortic Valve: The aortic valve is tricuspid. Aortic valve regurgitation is not visualized. Aortic valve mean gradient measures 3.0 mmHg. Aortic valve peak gradient measures 6.5 mmHg. Aortic valve area, by VTI measures 2.65 cm. Pulmonic Valve: The pulmonic valve was normal in structure. Pulmonic valve regurgitation is not visualized. Aorta: The aortic root and ascending aorta are structurally normal, with no evidence of dilitation. IAS/Shunts: The atrial septum is grossly normal. Additional Comments: A device lead is visualized.  LEFT VENTRICLE PLAX 2D LVIDd:         4.10 cm  Diastology LVIDs:         2.80 cm  LV e' medial:    6.85 cm/s LV PW:         0.80 cm  LV E/e' medial:  12.0 LV IVS:        1.20 cm  LV e' lateral:   10.20 cm/s LVOT diam:     1.80 cm  LV E/e' lateral: 8.1 LV SV:         73 LV SV Index:   43 LVOT Area:     2.54 cm  RIGHT VENTRICLE          IVC RV Basal diam:  3.20 cm  IVC diam: 1.70 cm LEFT ATRIUM             Index       RIGHT  ATRIUM           Index LA diam:        3.60 cm 2.13 cm/m  RA Area:     15.10 cm LA Vol (A2C):   53.5  ml 31.63 ml/m RA Volume:   42.90 ml  25.37 ml/m LA Vol (A4C):   26.3 ml 15.55 ml/m LA Biplane Vol: 38.6 ml 22.82 ml/m  AORTIC VALVE AV Area (Vmax):    2.46 cm AV Area (Vmean):   2.77 cm AV Area (VTI):     2.65 cm AV Vmax:           127.00 cm/s AV Vmean:          86.200 cm/s AV VTI:            0.275 m AV Peak Grad:      6.5 mmHg AV Mean Grad:      3.0 mmHg LVOT Vmax:         123.00 cm/s LVOT Vmean:        94.000 cm/s LVOT VTI:          0.286 m LVOT/AV VTI ratio: 1.04  AORTA Ao Root diam: 2.90 cm Ao Asc diam:  3.80 cm MITRAL VALVE               TRICUSPID VALVE MV Area (PHT): 4.60 cm    TR Peak grad:   28.1 mmHg MV Decel Time: 165 msec    TR Vmax:        265.00 cm/s MV E velocity: 82.30 cm/s MV A velocity: 90.40 cm/s  SHUNTS MV E/A ratio:  0.91        Systemic VTI:  0.29 m                            Systemic Diam: 1.80 cm Mertie Moores MD Electronically signed by Mertie Moores MD Signature Date/Time: 09/14/2020/12:36:32 PM    Final (Updated)         Scheduled Meds: . amLODipine  2.5 mg Oral Daily  . apixaban  5 mg Oral BID  . fluticasone  2 spray Each Nare Daily  . levothyroxine  100 mcg Oral Q0600  . lisinopril  40 mg Oral Daily  . metoprolol tartrate  25 mg Oral BID  . pantoprazole  40 mg Oral BID AC   Continuous Infusions: . sodium chloride 75 mL/hr at 09/15/20 1042     LOS: 1 day    Time spent: 40 minutes    Irine Seal, MD [facility]   To contact the attending provider between 7A-7P or the covering provider during after hours 7P-7A, please log into the web site [email or web address] and access using universal [email or web address] password for that web site. If you do not have the password, please call the hospital operator.  09/15/2020, 11:22 AM

## 2020-09-16 ENCOUNTER — Inpatient Hospital Stay (HOSPITAL_COMMUNITY): Payer: Medicare Other

## 2020-09-16 DIAGNOSIS — N179 Acute kidney failure, unspecified: Secondary | ICD-10-CM | POA: Diagnosis not present

## 2020-09-16 DIAGNOSIS — R079 Chest pain, unspecified: Secondary | ICD-10-CM

## 2020-09-16 DIAGNOSIS — I10 Essential (primary) hypertension: Secondary | ICD-10-CM | POA: Diagnosis not present

## 2020-09-16 DIAGNOSIS — I442 Atrioventricular block, complete: Secondary | ICD-10-CM | POA: Diagnosis not present

## 2020-09-16 DIAGNOSIS — I480 Paroxysmal atrial fibrillation: Secondary | ICD-10-CM | POA: Diagnosis not present

## 2020-09-16 DIAGNOSIS — R0789 Other chest pain: Secondary | ICD-10-CM | POA: Diagnosis not present

## 2020-09-16 LAB — CBC
HCT: 33.4 % — ABNORMAL LOW (ref 36.0–46.0)
Hemoglobin: 11.2 g/dL — ABNORMAL LOW (ref 12.0–15.0)
MCH: 28 pg (ref 26.0–34.0)
MCHC: 33.5 g/dL (ref 30.0–36.0)
MCV: 83.5 fL (ref 80.0–100.0)
Platelets: 129 10*3/uL — ABNORMAL LOW (ref 150–400)
RBC: 4 MIL/uL (ref 3.87–5.11)
RDW: 12.3 % (ref 11.5–15.5)
WBC: 4.5 10*3/uL (ref 4.0–10.5)
nRBC: 0 % (ref 0.0–0.2)

## 2020-09-16 LAB — URINE CULTURE: Culture: <10000 — AB

## 2020-09-16 LAB — BASIC METABOLIC PANEL
Anion gap: 6 (ref 5–15)
BUN: 27 mg/dL — ABNORMAL HIGH (ref 8–23)
CO2: 24 mmol/L (ref 22–32)
Calcium: 8.4 mg/dL — ABNORMAL LOW (ref 8.9–10.3)
Chloride: 106 mmol/L (ref 98–111)
Creatinine, Ser: 0.94 mg/dL (ref 0.44–1.00)
GFR, Estimated: >60 mL/min (ref >60)
Glucose, Bld: 103 mg/dL — ABNORMAL HIGH (ref 70–99)
Potassium: 4 mmol/L (ref 3.5–5.1)
Sodium: 136 mmol/L (ref 135–145)

## 2020-09-16 LAB — NM MYOCAR MULTI W/SPECT W/WALL MOTION / EF
Estimated workload: 1 METS
Exercise duration (min): 5 min
Exercise duration (sec): 31 s
MPHR: 138 {beats}/min
Peak HR: 105 {beats}/min
Percent HR: 76 %
Rest HR: 61 {beats}/min

## 2020-09-16 LAB — MAGNESIUM: Magnesium: 1.9 mg/dL (ref 1.7–2.4)

## 2020-09-16 MED ORDER — AMLODIPINE BESYLATE 5 MG PO TABS
5.0000 mg | ORAL_TABLET | Freq: Every day | ORAL | Status: DC
  Administered 2020-09-16 – 2020-09-17 (×2): 5 mg via ORAL
  Filled 2020-09-16 (×2): qty 1

## 2020-09-16 MED ORDER — REGADENOSON 0.4 MG/5ML IV SOLN
0.4000 mg | Freq: Once | INTRAVENOUS | Status: AC
  Filled 2020-09-16: qty 5

## 2020-09-16 MED ORDER — TECHNETIUM TC 99M TETROFOSMIN IV KIT
32.7000 | PACK | Freq: Once | INTRAVENOUS | Status: AC | PRN
  Administered 2020-09-16: 32.7 via INTRAVENOUS

## 2020-09-16 MED ORDER — TECHNETIUM TC 99M TETROFOSMIN IV KIT
11.0000 | PACK | Freq: Once | INTRAVENOUS | Status: AC | PRN
  Administered 2020-09-16: 11 via INTRAVENOUS

## 2020-09-16 MED ORDER — REGADENOSON 0.4 MG/5ML IV SOLN
INTRAVENOUS | Status: AC
  Administered 2020-09-16: 0.4 mg via INTRAVENOUS
  Filled 2020-09-16: qty 5

## 2020-09-16 MED ORDER — METOPROLOL TARTRATE 25 MG PO TABS: 25.0000 mg | ORAL_TABLET | Freq: Two times a day (BID) | ORAL | 2 refills | Status: DC

## 2020-09-16 MED ORDER — PANTOPRAZOLE SODIUM 40 MG PO TBEC: 40.0000 mg | DELAYED_RELEASE_TABLET | Freq: Two times a day (BID) | ORAL | 2 refills | Status: DC

## 2020-09-16 NOTE — Progress Notes (Signed)
   [name] presented for a nuclear stress test today.  No immediate complications.  Stress imaging is pending at this time.  Preliminary EKG findings may be listed in the chart, but the stress test result will not be finalized until perfusion imaging is complete.  1 day study, [place] to read.  Rosaria Ferries, PA-C 09/16/2020, 10:58 AM

## 2020-09-16 NOTE — Progress Notes (Addendum)
Progress Note  Patient Name: [name] Date of Encounter: 09/16/2020  [facility] Cardiologist: Virl Axe, MD **  Subjective   Denies any further chest pain.  Plan for nuclear stress test today  Inpatient Medications    Scheduled Meds: . amLODipine  2.5 mg Oral Daily  . apixaban  5 mg Oral BID  . fluticasone  2 spray Each Nare Daily  . levothyroxine  100 mcg Oral Q0600  . lisinopril  40 mg Oral Daily  . metoprolol tartrate  25 mg Oral BID  . pantoprazole  40 mg Oral BID AC   Continuous Infusions: . sodium chloride 75 mL/hr at 09/16/20 0555   PRN Meds: acetaminophen, alum & mag hydroxide-simeth **AND** lidocaine, diphenhydrAMINE, loratadine, morphine injection, nitroGLYCERIN, ondansetron (ZOFRAN) IV   Vital Signs    Vitals:   09/15/20 1835 09/15/20 2007 09/15/20 2341 09/16/20 0300  BP: (!) 152/67 139/67 (!) 144/63 (!) 166/66  Pulse: 64 62 64 65  Resp: 20 19 16 16   Temp: (!) 97.5 F (36.4 C) 97.6 F (36.4 C) 98 F (36.7 C) 97.8 F (36.6 C)  TempSrc: Oral Oral Oral Oral  SpO2: 100% 100% 90% 97%  Weight:      Height:        Intake/Output Summary (Last 24 hours) at 09/16/2020 0839 Last data filed at 09/16/2020 0555 Gross per 24 hour  Intake 1366.21 ml  Output --  Net 1366.21 ml   Last 3 Weights 09/13/2020 09/13/2020 09/13/2020  Weight (lbs) 145 lb 15.1 oz 147 lb 147 lb 3.2 oz  Weight (kg) 66.2 kg 66.679 kg 66.769 kg      Telemetry    V paced - Personally Reviewed  ECG    No new EKG to review- Personally Reviewed  Physical Exam   GEN: Well nourished, well developed in no acute distress HEENT: Normal NECK: No JVD; No carotid bruits LYMPHATICS: No lymphadenopathy CARDIAC:RRR, no murmurs, rubs, gallops RESPIRATORY:  Clear to auscultation without rales, wheezing or rhonchi  ABDOMEN: Soft, non-tender, non-distended MUSCULOSKELETAL:  No edema; No deformity  SKIN: Warm and dry NEUROLOGIC:  Alert and oriented x 3 PSYCHIATRIC:  Normal affect     Labs    High Sensitivity Troponin:   Recent Labs  Lab 09/13/20 1208 09/13/20 1446  TROPONINIHS 12 14      Chemistry Recent Labs  Lab 09/14/20 0128 09/15/20 0128 09/16/20 0135  NA 136 133* 136  K 4.1 4.0 4.0  CL 104 102 106  CO2 25 23 24   GLUCOSE 102* 101* 103*  BUN 19 29* 27*  CREATININE 1.13* 1.32* 0.94  CALCIUM 9.3 8.8* 8.4*  GFRNONAA 49* 40* >60  ANIONGAP 7 8 6      Hematology Recent Labs  Lab 09/14/20 0128 09/15/20 0955 09/16/20 0135  WBC 5.4 6.0 4.5  RBC 4.23 4.42 4.00  HGB 12.0 12.2 11.2*  HCT 35.3* 37.7 33.4*  MCV 83.5 85.3 83.5  MCH 28.4 27.6 28.0  MCHC 34.0 32.4 33.5  RDW 12.4 12.3 12.3  PLT 144* 144* 129*    BNP Recent Labs  Lab 09/13/20 1208  BNP 226.9*     DDimer No results for input(s): DDIMER in the last 168 hours.   CHA2DS2-VASc Score = 6   Radiology    US Abdomen Complete  Result Date: 09/15/2020 CLINICAL DATA:  Abdominal pain EXAM: ABDOMEN ULTRASOUND COMPLETE COMPARISON:  Chest x-ray 09/13/2020, CT 07/12/2015 FINDINGS: Gallbladder: Small gallstone. Normal wall thickness. Negative sonographic Murphy Common bile duct: Diameter: 2.2  mm Liver: No focal lesion identified. Within normal limits in parenchymal echogenicity. Portal vein is patent on color Doppler imaging with normal direction of blood flow towards the liver. IVC: No abnormality visualized. Pancreas: Visualized portion unremarkable. Spleen: Upper normal in size. Right Kidney: Length: 10.7 cm. Echogenicity within normal limits. No mass or hydronephrosis visualized. Left Kidney: Length: Surgically absent. Abdominal aorta: No aneurysm visualized. Other findings: None. IMPRESSION: 1. Status post left nephrectomy. 2. Cholelithiasis without sonographic evidence for acute cholecystitis Electronically Signed   By: Donavan Foil M.D.   On: 09/15/2020 00:10   ECHOCARDIOGRAM COMPLETE  Result Date: 09/14/2020    ECHOCARDIOGRAM REPORT   Patient Name:   [name] Date of Exam:  09/14/2020 Medical Rec #:  [id number]       Height:       63.0 in Accession #:    [id number]      Weight:       145.9 lb Date of Birth:  1940-08-19        BSA:          1.691 m Patient Age:    80 years        BP:           126/53 mmHg Patient Gender: F               HR:           63 bpm. Exam Location:  Inpatient Procedure: 2D Echo, Cardiac Doppler and Color Doppler Indications:     R07.9* Chest pain, unspecified  History:         Patient has prior history of Echocardiogram examinations, most                  recent 10/12/2014.  Sonographer:     Merrie Roof RDCS Referring Phys:  [id number] Cleaster Corin PATEL Diagnosing Phys: Mertie Moores MD IMPRESSIONS  1. Left ventricular ejection fraction, by estimation, is 55 to 60%. The left ventricle has normal function. The left ventricle has no regional wall motion abnormalities. There is mild asymmetric left ventricular hypertrophy. Left ventricular diastolic parameters are consistent with Grade II diastolic dysfunction (pseudonormalization).  2. Right ventricular systolic function is normal. The right ventricular size is normal. There is normal pulmonary artery systolic pressure.  3. The mitral valve is grossly normal. Mild mitral valve regurgitation.  4. Tricuspid valve regurgitation is mild to moderate.  5. The aortic valve is tricuspid. Aortic valve regurgitation is not visualized. FINDINGS  Left Ventricle: Left ventricular ejection fraction, by estimation, is 55 to 60%. The left ventricle has normal function. The left ventricle has no regional wall motion abnormalities. The left ventricular internal cavity size was normal in size. There is  mild asymmetric left ventricular hypertrophy. Left ventricular diastolic parameters are consistent with Grade II diastolic dysfunction (pseudonormalization). Right Ventricle: The right ventricular size is normal. No increase in right ventricular wall thickness. Right ventricular systolic function is normal. There is normal pulmonary artery  systolic pressure. The tricuspid regurgitant velocity is 2.65 m/s, and  with an assumed right atrial pressure of 3 mmHg, the estimated right ventricular systolic pressure is 10.6 mmHg. Left Atrium: Left atrial size was normal in size. Right Atrium: Right atrial size was normal in size. Pericardium: There is no evidence of pericardial effusion. Mitral Valve: The mitral valve is grossly normal. Mild mitral valve regurgitation. Tricuspid Valve: The tricuspid valve is grossly normal. Tricuspid valve regurgitation is mild to moderate. Aortic Valve: The aortic valve is  tricuspid. Aortic valve regurgitation is not visualized. Aortic valve mean gradient measures 3.0 mmHg. Aortic valve peak gradient measures 6.5 mmHg. Aortic valve area, by VTI measures 2.65 cm. Pulmonic Valve: The pulmonic valve was normal in structure. Pulmonic valve regurgitation is not visualized. Aorta: The aortic root and ascending aorta are structurally normal, with no evidence of dilitation. IAS/Shunts: The atrial septum is grossly normal. Additional Comments: A device lead is visualized.  LEFT VENTRICLE PLAX 2D LVIDd:         4.10 cm  Diastology LVIDs:         2.80 cm  LV e' medial:    6.85 cm/s LV PW:         0.80 cm  LV E/e' medial:  12.0 LV IVS:        1.20 cm  LV e' lateral:   10.20 cm/s LVOT diam:     1.80 cm  LV E/e' lateral: 8.1 LV SV:         73 LV SV Index:   43 LVOT Area:     2.54 cm  RIGHT VENTRICLE          IVC RV Basal diam:  3.20 cm  IVC diam: 1.70 cm LEFT ATRIUM             Index       RIGHT ATRIUM           Index LA diam:        3.60 cm 2.13 cm/m  RA Area:     15.10 cm LA Vol (A2C):   53.5 ml 31.63 ml/m RA Volume:   42.90 ml  25.37 ml/m LA Vol (A4C):   26.3 ml 15.55 ml/m LA Biplane Vol: 38.6 ml 22.82 ml/m  AORTIC VALVE AV Area (Vmax):    2.46 cm AV Area (Vmean):   2.77 cm AV Area (VTI):     2.65 cm AV Vmax:           127.00 cm/s AV Vmean:          86.200 cm/s AV VTI:            0.275 m AV Peak Grad:      6.5 mmHg AV Mean  Grad:      3.0 mmHg LVOT Vmax:         123.00 cm/s LVOT Vmean:        94.000 cm/s LVOT VTI:          0.286 m LVOT/AV VTI ratio: 1.04  AORTA Ao Root diam: 2.90 cm Ao Asc diam:  3.80 cm MITRAL VALVE               TRICUSPID VALVE MV Area (PHT): 4.60 cm    TR Peak grad:   28.1 mmHg MV Decel Time: 165 msec    TR Vmax:        265.00 cm/s MV E velocity: 82.30 cm/s MV A velocity: 90.40 cm/s  SHUNTS MV E/A ratio:  0.91        Systemic VTI:  0.29 m                            Systemic Diam: 1.80 cm Mertie Moores MD Electronically signed by Mertie Moores MD Signature Date/Time: 09/14/2020/12:36:32 PM    Final (Updated)     Cardiac Studies   2D echo IMPRESSIONS   1. Left ventricular ejection fraction, by estimation, is 55 to 60%. The  left  ventricle has normal function. The left ventricle has no regional  wall motion abnormalities. There is mild asymmetric left ventricular  hypertrophy. Left ventricular diastolic  parameters are consistent with Grade II diastolic dysfunction  (pseudonormalization).  2. Right ventricular systolic function is normal. The right ventricular  size is normal. There is normal pulmonary artery systolic pressure.  3. The mitral valve is grossly normal. Mild mitral valve regurgitation.  4. Tricuspid valve regurgitation is mild to moderate.  5. The aortic valve is tricuspid. Aortic valve regurgitation is not  visualized.   Patient Profile     80 y.o. 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 Grandville Silos.  Assessment & Plan    1. Chest pain with mixed features, also recent GI illness - troponins negative - EKG paced so challenging to  assess - chest pain relieved with GI measures, not specifically exertiona - 2D echo with normal LVF and no focal wall motion abnormalities - general feeling is that this is more likely GI related as sx resolve with GI cocktail and belching but she also has left arm aching that is completely different from her GI symptoms - Abd US showed gallstones but no sonographic evidence of acute cholecystitis but would not explain left arm pain - given her bump in SCr and only having a single kidney will avoid coronary CTA - plan for lexiscan myoview today>>if no ischemia then no further cardiac workup  2. Paroxysmal atrial fibrillation (also prior h/o PPM) - chest pain does not appear to be related to arrhythmia as she has been maintaining NSR - on Eliquis PTA which has been continued here  4. Probable CKD stage IIIa - AKI with Scr bumped to 1.32 yesterday but improved today to 0.94 -per TRH  5. Essential HTN - Bp borderline elevated today at 166/83mmHg - continue Lisinopril 40mg  daily, Lopressor 25mg  BID - diuretic stopped due to bump in SCr - cannot titrate BB further due to HR in low 60's - increase amlodipine to 5mg  daily      For questions or updates, please contact [place] Please consult [email or web address] for contact info under        Signed, Fransico Him, MD  09/16/2020, 8:39 AM

## 2020-09-16 NOTE — Progress Notes (Signed)
PROGRESS NOTE    [name]  TFT:[id number] DOB: [birth year] DOA: 09/13/2020 PCP: [name], DO    Chief Complaint  Patient presents with  . Chest Pain    Brief Narrative:  Patient is a 80 year old female history of paroxysmal A. fib on Eliquis, CHB status post PPM, hypertension, hyperlipidemia, hypothyroidism presented to the ED with substernal chest pain radiating to the left jaw left shoulder left upper extremity with arm pain described as a aching pain.  Patient also states some improvement with chest pain with belching.  Patient endorses shortness of breath when she walks up a flight of stairs which is new onset. Patient seen in the ED admitted for chest pain rule out.  Cardiac enzymes negative x3.  2D echo pending.  Chest x-ray unremarkable.  Cardiology consultation pending for evaluation for inpatient versus outpatient invasive work-up.   Assessment & Plan:   Principal Problem:   Chest pain Active Problems:   Essential hypertension   AV BLOCK, COMPLETE   Hypothyroidism, postsurgical   Paroxysmal atrial fibrillation (HCC)   Congestion of nasal sinus   AKI (acute kidney injury) (HCC)   Left-sided chest pain  1 chest pain/left upper extremity pain Patient presented with chest pain with both typical and atypical features as well as left upper extremity aching pain.  High-sensitivity troponin negative x2.  EKG with with right bundle branch block, paced rhythm with no acute ischemic changes noted.  Patient low risk for PE by Wells and modified Geneva score with adherence to Eliquis. -Patient with multiple cardiac risk factors. -2D echo with a EF of 55 to 60%, no wall motion abnormalities, mild asymmetric left ventricular hypertrophy, grade 2 diastolic dysfunction, mild mitral valve regurgitation, mild to moderate tricuspid valvular regurgitation.   -Abdominal ultrasound done with cholelithiasis without sonographic evidence of acute cholecystitis, status post left  nephrectomy.   -Patient still with left upper extremity aching pain.   -Patient states chest discomfort improved with GI cocktail however develops belching and chest discomfort whenever she eats.   -Chest discomfort likely GI related secondary to GERD as patient with symptomatic improvement on PPI and GI cocktail -Patient seen by GI who is in agreement that chest discomfort likely a component of reflux and recommending continuation of PPI twice daily x6 weeks then PPI 40 mg daily indefinitely with outpatient follow-up with GI.  -Patient for Myoview Lexiscan scan today per cardiology for further evaluation of left upper extremity aching pain however chest discomfort likely GI related.  -GI cocktail as needed.   -Continue Lopressor, lisinopril. -Cardiology following and appreciate input and recommendations.   2.  Hypothyroidism Continue Synthroid.    3.  Hypertension Stable.  Norvasc increased to 5 mg daily.  Continue lisinopril, Lopressor.  Continue to hold HCTZ as patient with a bump in creatinine and likely will not resume on discharge.  Follow.   4.  Paroxysmal atrial fibrillation/history of CHB status post PPM Rate controlled on Lopressor.  Patient status post PPM.  Eliquis for anticoagulation.    5.  Hypokalemia Likely secondary to HCTZ.  Repleted.  Continue to hold HCTZ.  Follow.  6.  Congestion of nasal sinus Patient with complaints of nasal congestion and probable sinus headache.  Improved with symptomatic treatment.  Continue Flonase, Reglan as needed, morphine as needed for significant headache.   7.  Acute kidney injury/status post left nephrectomy Patient noted to have a bump in the creatinine up to 1.32 on 09/15/2020.  Secondary to prerenal azotemia.  BUN and creatinine elevated.  Urine sodium < 10.  Urinalysis unremarkable.  Patient hydrated with IV fluids with improvement with renal function.  Saline lock IV fluids after procedure today and when patient has been started on oral  intake.     DVT prophylaxis: Eliquis Code Status: Full Family Communication: Updated patient.  No family at bedside. Disposition:   Status is: Observation    Dispo: The patient is from: Home              Anticipated d/c is to: Home              Patient currently scheduled for [place] study today.  Not stable for discharge.     Difficult to place patient no       Consultants:   Cardiology Dr. Radford Pax 09/14/2020  Gastroenterology: Dr. Paulita Fujita 09/15/2020  Procedures:   2D echo 09/14/2020  Chest x-ray 09/13/2020  Abdominal ultrasound 09/14/2020   Lexiscan Myoview pending 09/16/2020  Antimicrobials:  None   Subjective: Patient sitting up in chair.  Denies any ongoing chest pain.  States left upper extremity aching has improved.  No nausea or vomiting.  Denies any headache.  Feeling better.  Asking whether she can eat after her procedure this morning.    Objective: Vitals:   09/15/20 2007 09/15/20 2341 09/16/20 0300 09/16/20 0850  BP: 139/67 (!) 144/63 (!) 166/66 (!) 136/96  Pulse: 62 64 65 60  Resp: 19 16 16 18   Temp: 97.6 F (36.4 C) 98 F (36.7 C) 97.8 F (36.6 C) 97.7 F (36.5 C)  TempSrc: Oral Oral Oral Oral  SpO2: 100% 90% 97% 100%  Weight:      Height:        Intake/Output Summary (Last 24 hours) at 09/16/2020 0857 Last data filed at 09/16/2020 0555 Gross per 24 hour  Intake 1366.21 ml  Output --  Net 1366.21 ml   Filed Weights   09/13/20 1158 09/13/20 1700  Weight: 66.7 kg 66.2 kg    Examination:  General exam: : NAD Respiratory system: CTAB anterior lung fields.  No wheezes, no rhonchi.  Speaking in full sentences.  Normal respiratory effort. Cardiovascular system: Regular rate and rhythm no murmurs rubs or gallops.  No JVD.  No lower extremity edema.  Gastrointestinal system: Abdomen soft, nontender, nondistended, positive bowel sounds.  No rebound.  No guarding. Central nervous system: Alert and oriented. No focal neurological  deficits. Extremities: Symmetric 5 x 5 power. Skin: No rashes, lesions or ulcers Psychiatry: Judgement and insight appear normal. Mood & affect appropriate.   Data Reviewed: I have personally reviewed following labs and imaging studies  CBC: Recent Labs  Lab 09/13/20 1208 09/14/20 0128 09/15/20 0955 09/16/20 0135  WBC 5.4 5.4 6.0 4.5  NEUTROABS  --   --  4.1  --   HGB 13.1 12.0 12.2 11.2*  HCT 40.2 35.3* 37.7 33.4*  MCV 84.6 83.5 85.3 83.5  PLT 147* 144* 144* 129*    Basic Metabolic Panel: Recent Labs  Lab 09/13/20 1208 09/14/20 0128 09/15/20 0128 09/16/20 0135  NA 136 136 133* 136  K 3.4* 4.1 4.0 4.0  CL 99 104 102 106  CO2 26 25 23 24   GLUCOSE 99 102* 101* 103*  BUN 18 19 29* 27*  CREATININE 1.01* 1.13* 1.32* 0.94  CALCIUM 9.9 9.3 8.8* 8.4*  MG  --   --   --  1.9    GFR: Estimated Creatinine Clearance: 42.2 mL/min (by C-G formula based on  SCr of 0.94 mg/dL).  Liver Function Tests: No results for input(s): AST, ALT, ALKPHOS, BILITOT, PROT, ALBUMIN in the last 168 hours.  CBG: No results for input(s): GLUCAP in the last 168 hours.   Recent Results (from the past 240 hour(s))  SARS CORONAVIRUS 2 (TAT 6-24 HRS) Nasopharyngeal Nasopharyngeal Swab     Status: None   Collection Time: 09/13/20  2:03 PM   Specimen: Nasopharyngeal Swab  Result Value Ref Range Status   SARS Coronavirus 2 NEGATIVE NEGATIVE Final    Comment: (NOTE) SARS-CoV-2 target nucleic acids are NOT DETECTED.  The SARS-CoV-2 RNA is generally detectable in upper and lower respiratory specimens during the acute phase of infection. Negative results do not preclude SARS-CoV-2 infection, do not rule out co-infections with other pathogens, and should not be used as the sole basis for treatment or other patient management decisions. Negative results must be combined with clinical observations, patient history, and epidemiological information. The expected result is Negative.  Fact Sheet for  Patients: SugarRoll.be  Fact Sheet for Healthcare Providers: https://www.woods-mathews.com/  This test is not yet approved or cleared by the [place] FDA and  has been authorized for detection and/or diagnosis of SARS-CoV-2 by FDA under an Emergency Use Authorization (EUA). This EUA will remain  in effect (meaning this test can be used) for the duration of the COVID-19 declaration under Se ction 564(b)(1) of the Act, 21 U.S.C. section 360bbb-3(b)(1), unless the authorization is terminated or revoked sooner.  Performed at [facility], [place] [address], [place],  [address]          Radiology Studies: US Abdomen Complete  Result Date: 09/15/2020 CLINICAL DATA:  Abdominal pain EXAM: ABDOMEN ULTRASOUND COMPLETE COMPARISON:  Chest x-ray 09/13/2020, CT 07/12/2015 FINDINGS: Gallbladder: Small gallstone. Normal wall thickness. Negative sonographic Murphy Common bile duct: Diameter: 2.2 mm Liver: No focal lesion identified. Within normal limits in parenchymal echogenicity. Portal vein is patent on color Doppler imaging with normal direction of blood flow towards the liver. IVC: No abnormality visualized. Pancreas: Visualized portion unremarkable. Spleen: Upper normal in size. Right Kidney: Length: 10.7 cm. Echogenicity within normal limits. No mass or hydronephrosis visualized. Left Kidney: Length: Surgically absent. Abdominal aorta: No aneurysm visualized. Other findings: None. IMPRESSION: 1. Status post left nephrectomy. 2. Cholelithiasis without sonographic evidence for acute cholecystitis Electronically Signed   By: Donavan Foil M.D.   On: 09/15/2020 00:10   ECHOCARDIOGRAM COMPLETE  Result Date: 09/14/2020    ECHOCARDIOGRAM REPORT   Patient Name:   LADONNE SHARPLES Falero Date of Exam: 09/14/2020 Medical Rec #:  [id number]       Height:       63.0 in Accession #:    [id number]      Weight:       145.9 lb Date of Birth:  27-Oct-1940        BSA:           1.691 m Patient Age:    69 years        BP:           126/53 mmHg Patient Gender: F               HR:           63 bpm. Exam Location:  Inpatient Procedure: 2D Echo, Cardiac Doppler and Color Doppler Indications:     R07.9* Chest pain, unspecified  History:         Patient has prior history of Echocardiogram examinations, most  recent 10/12/2014.  Sonographer:     Merrie Roof RDCS Referring Phys:  [id number] Cleaster Corin PATEL Diagnosing Phys: Mertie Moores MD IMPRESSIONS  1. Left ventricular ejection fraction, by estimation, is 55 to 60%. The left ventricle has normal function. The left ventricle has no regional wall motion abnormalities. There is mild asymmetric left ventricular hypertrophy. Left ventricular diastolic parameters are consistent with Grade II diastolic dysfunction (pseudonormalization).  2. Right ventricular systolic function is normal. The right ventricular size is normal. There is normal pulmonary artery systolic pressure.  3. The mitral valve is grossly normal. Mild mitral valve regurgitation.  4. Tricuspid valve regurgitation is mild to moderate.  5. The aortic valve is tricuspid. Aortic valve regurgitation is not visualized. FINDINGS  Left Ventricle: Left ventricular ejection fraction, by estimation, is 55 to 60%. The left ventricle has normal function. The left ventricle has no regional wall motion abnormalities. The left ventricular internal cavity size was normal in size. There is  mild asymmetric left ventricular hypertrophy. Left ventricular diastolic parameters are consistent with Grade II diastolic dysfunction (pseudonormalization). Right Ventricle: The right ventricular size is normal. No increase in right ventricular wall thickness. Right ventricular systolic function is normal. There is normal pulmonary artery systolic pressure. The tricuspid regurgitant velocity is 2.65 m/s, and  with an assumed right atrial pressure of 3 mmHg, the estimated right ventricular systolic  pressure is 10.2 mmHg. Left Atrium: Left atrial size was normal in size. Right Atrium: Right atrial size was normal in size. Pericardium: There is no evidence of pericardial effusion. Mitral Valve: The mitral valve is grossly normal. Mild mitral valve regurgitation. Tricuspid Valve: The tricuspid valve is grossly normal. Tricuspid valve regurgitation is mild to moderate. Aortic Valve: The aortic valve is tricuspid. Aortic valve regurgitation is not visualized. Aortic valve mean gradient measures 3.0 mmHg. Aortic valve peak gradient measures 6.5 mmHg. Aortic valve area, by VTI measures 2.65 cm. Pulmonic Valve: The pulmonic valve was normal in structure. Pulmonic valve regurgitation is not visualized. Aorta: The aortic root and ascending aorta are structurally normal, with no evidence of dilitation. IAS/Shunts: The atrial septum is grossly normal. Additional Comments: A device lead is visualized.  LEFT VENTRICLE PLAX 2D LVIDd:         4.10 cm  Diastology LVIDs:         2.80 cm  LV e' medial:    6.85 cm/s LV PW:         0.80 cm  LV E/e' medial:  12.0 LV IVS:        1.20 cm  LV e' lateral:   10.20 cm/s LVOT diam:     1.80 cm  LV E/e' lateral: 8.1 LV SV:         73 LV SV Index:   43 LVOT Area:     2.54 cm  RIGHT VENTRICLE          IVC RV Basal diam:  3.20 cm  IVC diam: 1.70 cm LEFT ATRIUM             Index       RIGHT ATRIUM           Index LA diam:        3.60 cm 2.13 cm/m  RA Area:     15.10 cm LA Vol (A2C):   53.5 ml 31.63 ml/m RA Volume:   42.90 ml  25.37 ml/m LA Vol (A4C):   26.3 ml 15.55 ml/m LA Biplane Vol: 38.6 ml 22.82 ml/m  AORTIC VALVE AV Area (Vmax):    2.46 cm AV Area (Vmean):   2.77 cm AV Area (VTI):     2.65 cm AV Vmax:           127.00 cm/s AV Vmean:          86.200 cm/s AV VTI:            0.275 m AV Peak Grad:      6.5 mmHg AV Mean Grad:      3.0 mmHg LVOT Vmax:         123.00 cm/s LVOT Vmean:        94.000 cm/s LVOT VTI:          0.286 m LVOT/AV VTI ratio: 1.04  AORTA Ao Root diam: 2.90 cm Ao  Asc diam:  3.80 cm MITRAL VALVE               TRICUSPID VALVE MV Area (PHT): 4.60 cm    TR Peak grad:   28.1 mmHg MV Decel Time: 165 msec    TR Vmax:        265.00 cm/s MV E velocity: 82.30 cm/s MV A velocity: 90.40 cm/s  SHUNTS MV E/A ratio:  0.91        Systemic VTI:  0.29 m                            Systemic Diam: 1.80 cm Mertie Moores MD Electronically signed by Mertie Moores MD Signature Date/Time: 09/14/2020/12:36:32 PM    Final (Updated)         Scheduled Meds: . amLODipine  5 mg Oral Daily  . apixaban  5 mg Oral BID  . fluticasone  2 spray Each Nare Daily  . levothyroxine  100 mcg Oral Q0600  . lisinopril  40 mg Oral Daily  . metoprolol tartrate  25 mg Oral BID  . pantoprazole  40 mg Oral BID AC   Continuous Infusions: . sodium chloride 75 mL/hr at 09/16/20 0555     LOS: 2 days    Time spent: 40 minutes    Irine Seal, MD [facility]   To contact the attending provider between 7A-7P or the covering provider during after hours 7P-7A, please log into the web site [email or web address] and access using universal  password for that web site. If you do not have the password, please call the hospital operator.  09/16/2020, 8:57 AM

## 2020-09-16 NOTE — Discharge Summary (Incomplete)
Physician Discharge Summary  [name] OEV:[id number] DOB: 1941-04-02 DOA: 09/13/2020  PCP: [name], DO  Admit date: 09/13/2020 Discharge date: 09/16/2020  Time spent: *** minutes  Recommendations for Outpatient Follow-up:  1. ***  2. *** if discharging to SNF, indicate specific facility 3. (include homehealth, outpatient follow-up instructions, specific recommendations for PCP to follow-up on, etc.)   Discharge Diagnoses:  Principal Problem:   Chest pain Active Problems:   Essential hypertension   AV BLOCK, COMPLETE   Hypothyroidism, postsurgical   Paroxysmal atrial fibrillation (HCC)   Congestion of nasal sinus   AKI (acute kidney injury) ([facility])   Left-sided chest pain   Discharge Condition: ***  Diet recommendation: ***  Filed Weights   09/13/20 1158 09/13/20 1700  Weight: 66.7 kg 66.2 kg    History of present illness:  ***  Hospital Course:  ***  Procedures:  *** (i.e. Studies not automatically included, echos, thoracentesis, etc; not x-rays)  Consultations:  ***  Discharge Exam: Vitals:   09/16/20 1054 09/16/20 1153  BP: 138/80 (!) 160/58  Pulse: 83 76  Resp:    Temp:    SpO2:      General: *** Cardiovascular: *** Respiratory: ***  Discharge Instructions   Discharge Instructions    Diet - low sodium heart healthy   Complete by: As directed    Increase activity slowly   Complete by: As directed      Allergies as of 09/16/2020      Reactions   Tape Rash   Codeine Nausea And Vomiting    Med Rec must be completed prior to using this SMARTLINK***      Allergies  Allergen Reactions  . Tape Rash  . Codeine Nausea And Vomiting    Follow-up Information    [name], DO. Schedule an appointment as soon as possible for a visit in 2 week(s).   Specialty: Family Medicine Contact information: [phone] [place] [address] [phone]        Deboraha Sprang, MD .   Specialty:  Cardiology Contact information: [phone] [address] [place] [address] [phone]        Ronald Lobo, MD. Schedule an appointment as soon as possible for a visit in 2 week(s).   Specialty: Gastroenterology Why: F/U IN 2-3 WEEKS. Contact information: [address] [place] [address] [phone]                The results of significant diagnostics from this hospitalization (including imaging, microbiology, ancillary and laboratory) are listed below for reference.    Significant Diagnostic Studies: DG Chest 2 View  Result Date: 09/13/2020 CLINICAL DATA:  Chest pain. Additional history provided: Chest pain and left arm pain since Sunday, worsening last night, dizziness when standing. EXAM: CHEST - 2 VIEW COMPARISON:  CT chest 03/16/2018.  Chest radiographs 07/30/2016. FINDINGS: Redemonstrated left chest dual lead pacer device. Heart size within normal limits. Aortic atherosclerosis. No appreciable airspace consolidation. No evidence of pleural effusion or pneumothorax. No acute bony abnormality is identified. Surgical clips within the lower neck. Multiple previously augmented vertebral compression fractures within the thoracic and lumbar spine. IMPRESSION: No evidence of acute cardiopulmonary abnormality. Aortic Atherosclerosis (ICD10-I70.0). Electronically Signed   By: Kellie Simmering DO   On: 09/13/2020 12:48   US Abdomen Complete  Result Date: 09/15/2020 CLINICAL DATA:  Abdominal pain EXAM: ABDOMEN ULTRASOUND COMPLETE COMPARISON:  Chest x-ray 09/13/2020, CT 07/12/2015 FINDINGS: Gallbladder: Small  gallstone. Normal wall thickness. Negative sonographic Murphy Common bile duct: Diameter: 2.2 mm Liver: No focal lesion identified. Within normal limits in parenchymal echogenicity. Portal vein is patent on color Doppler imaging with normal direction of blood flow towards the liver. IVC: No abnormality visualized. Pancreas: Visualized portion  unremarkable. Spleen: Upper normal in size. Right Kidney: Length: 10.7 cm. Echogenicity within normal limits. No mass or hydronephrosis visualized. Left Kidney: Length: Surgically absent. Abdominal aorta: No aneurysm visualized. Other findings: None. IMPRESSION: 1. Status post left nephrectomy. 2. Cholelithiasis without sonographic evidence for acute cholecystitis Electronically Signed   By: Donavan Foil M.D.   On: 09/15/2020 00:10   NM Myocar Multi W/Spect W/Wall Motion / EF  Result Date: 09/16/2020 CLINICAL DATA:  Chest pain EXAM: MYOCARDIAL IMAGING WITH SPECT (REST AND PHARMACOLOGIC-STRESS) GATED LEFT VENTRICULAR WALL MOTION STUDY LEFT VENTRICULAR EJECTION FRACTION TECHNIQUE: Standard myocardial SPECT imaging was performed after resting intravenous injection of 11 mCi Tc-29m Myoview. Subsequently, intravenous infusion of Lexiscan was performed under the supervision of the Cardiology staff. At peak effect of the drug, 32.7 mCi Tc-53m Myoview was injected intravenously and standard myocardial SPECT imaging was performed. Quantitative gated imaging was also performed to evaluate left ventricular wall motion, and estimate left ventricular ejection fraction. COMPARISON:  None. FINDINGS: Perfusion: Reduced activity in the inferior wall favors diaphragmatic attenuation based on the raw images, although scarring may cause a similar appearance. No inducible ischemia identified. Wall Motion: Apical hypokinesis.  No left ventricular dilation. Left Ventricular Ejection Fraction: 65 % End diastolic volume 51 ml End systolic volume 18 ml IMPRESSION: 1. Matched reduced activity in the inferior wall is somewhat large, but probably from diaphragmatic attenuation rather than scarring given the reasonable wall motion in this vicinity. 2. Mild apical hypokinesis. 3. Left ventricular ejection fraction 65% 4. Non invasive risk stratification*: Low *2012 Appropriate Use Criteria for Coronary Revascularization Focused Update: J Am  Coll Cardiol. 2979;89(2):119-417. http://content.[email or web address]?[email or web address] Electronically Signed   By: Van Clines M.D.   On: 09/16/2020 17:31   ECHOCARDIOGRAM COMPLETE  Result Date: 09/14/2020    ECHOCARDIOGRAM REPORT   Patient Name:   [name] Date of Exam: 09/14/2020 Medical Rec #:  [id number]       Height:       63.0 in Accession #:    [id number]      Weight:       145.9 lb Date of Birth:  04/17/1941        BSA:          1.691 m Patient Age:    80 years        BP:           126/53 mmHg Patient Gender: F               HR:           63 bpm. Exam Location:  Inpatient Procedure: 2D Echo, Cardiac Doppler and Color Doppler Indications:     R07.9* Chest pain, unspecified  History:         Patient has prior history of Echocardiogram examinations, most                  recent 10/12/2014.  Sonographer:     Merrie Roof RDCS Referring Phys:  [id number] Cleaster Corin PATEL Diagnosing Phys: Mertie Moores MD IMPRESSIONS  1. Left ventricular ejection fraction, by estimation, is 55 to 60%. The left ventricle has normal function. The left ventricle has no regional wall motion abnormalities. There is  mild asymmetric left ventricular hypertrophy. Left ventricular diastolic parameters are consistent with Grade II diastolic dysfunction (pseudonormalization).  2. Right ventricular systolic function is normal. The right ventricular size is normal. There is normal pulmonary artery systolic pressure.  3. The mitral valve is grossly normal. Mild mitral valve regurgitation.  4. Tricuspid valve regurgitation is mild to moderate.  5. The aortic valve is tricuspid. Aortic valve regurgitation is not visualized. FINDINGS  Left Ventricle: Left ventricular ejection fraction, by estimation, is 55 to 60%. The left ventricle has normal function. The left ventricle has no regional wall motion abnormalities. The left ventricular internal cavity size was normal in size. There is  mild asymmetric left ventricular hypertrophy.  Left ventricular diastolic parameters are consistent with Grade II diastolic dysfunction (pseudonormalization). Right Ventricle: The right ventricular size is normal. No increase in right ventricular wall thickness. Right ventricular systolic function is normal. There is normal pulmonary artery systolic pressure. The tricuspid regurgitant velocity is 2.65 m/s, and  with an assumed right atrial pressure of 3 mmHg, the estimated right ventricular systolic pressure is 09.3 mmHg. Left Atrium: Left atrial size was normal in size. Right Atrium: Right atrial size was normal in size. Pericardium: There is no evidence of pericardial effusion. Mitral Valve: The mitral valve is grossly normal. Mild mitral valve regurgitation. Tricuspid Valve: The tricuspid valve is grossly normal. Tricuspid valve regurgitation is mild to moderate. Aortic Valve: The aortic valve is tricuspid. Aortic valve regurgitation is not visualized. Aortic valve mean gradient measures 3.0 mmHg. Aortic valve peak gradient measures 6.5 mmHg. Aortic valve area, by VTI measures 2.65 cm. Pulmonic Valve: The pulmonic valve was normal in structure. Pulmonic valve regurgitation is not visualized. Aorta: The aortic root and ascending aorta are structurally normal, with no evidence of dilitation. IAS/Shunts: The atrial septum is grossly normal. Additional Comments: A device lead is visualized.  LEFT VENTRICLE PLAX 2D LVIDd:         4.10 cm  Diastology LVIDs:         2.80 cm  LV e' medial:    6.85 cm/s LV PW:         0.80 cm  LV E/e' medial:  12.0 LV IVS:        1.20 cm  LV e' lateral:   10.20 cm/s LVOT diam:     1.80 cm  LV E/e' lateral: 8.1 LV SV:         73 LV SV Index:   43 LVOT Area:     2.54 cm  RIGHT VENTRICLE          IVC RV Basal diam:  3.20 cm  IVC diam: 1.70 cm LEFT ATRIUM             Index       RIGHT ATRIUM           Index LA diam:        3.60 cm 2.13 cm/m  RA Area:     15.10 cm LA Vol (A2C):   53.5 ml 31.63 ml/m RA Volume:   42.90 ml  25.37 ml/m  LA Vol (A4C):   26.3 ml 15.55 ml/m LA Biplane Vol: 38.6 ml 22.82 ml/m  AORTIC VALVE AV Area (Vmax):    2.46 cm AV Area (Vmean):   2.77 cm AV Area (VTI):     2.65 cm AV Vmax:           127.00 cm/s AV Vmean:          86.200  cm/s AV VTI:            0.275 m AV Peak Grad:      6.5 mmHg AV Mean Grad:      3.0 mmHg LVOT Vmax:         123.00 cm/s LVOT Vmean:        94.000 cm/s LVOT VTI:          0.286 m LVOT/AV VTI ratio: 1.04  AORTA Ao Root diam: 2.90 cm Ao Asc diam:  3.80 cm MITRAL VALVE               TRICUSPID VALVE MV Area (PHT): 4.60 cm    TR Peak grad:   28.1 mmHg MV Decel Time: 165 msec    TR Vmax:        265.00 cm/s MV E velocity: 82.30 cm/s MV A velocity: 90.40 cm/s  SHUNTS MV E/A ratio:  0.91        Systemic VTI:  0.29 m                            Systemic Diam: 1.80 cm Mertie Moores MD Electronically signed by Mertie Moores MD Signature Date/Time: 09/14/2020/12:36:32 PM    Final (Updated)     Microbiology: Recent Results (from the past 240 hour(s))  SARS CORONAVIRUS 2 (TAT 6-24 HRS) Nasopharyngeal Nasopharyngeal Swab     Status: None   Collection Time: 09/13/20  2:03 PM   Specimen: Nasopharyngeal Swab  Result Value Ref Range Status   SARS Coronavirus 2 NEGATIVE NEGATIVE Final    Comment: (NOTE) SARS-CoV-2 target nucleic acids are NOT DETECTED.  The SARS-CoV-2 RNA is generally detectable in upper and lower respiratory specimens during the acute phase of infection. Negative results do not preclude SARS-CoV-2 infection, do not rule out co-infections with other pathogens, and should not be used as the sole basis for treatment or other patient management decisions. Negative results must be combined with clinical observations, patient history, and epidemiological information. The expected result is Negative.  Fact Sheet for Patients: SugarRoll.be  Fact Sheet for Healthcare Providers: https://www.woods-mathews.com/  This test is not yet approved or  cleared by the [place] FDA and  has been authorized for detection and/or diagnosis of SARS-CoV-2 by FDA under an Emergency Use Authorization (EUA). This EUA will remain  in effect (meaning this test can be used) for the duration of the COVID-19 declaration under Se ction 564(b)(1) of the Act, 21 U.S.C. section 360bbb-3(b)(1), unless the authorization is terminated or revoked sooner.  Performed at [facility], [place] [address] [place], [place] [address]   Culture, Urine     Status: Abnormal   Collection Time: 09/15/20  9:51 AM   Specimen: Urine, Catheterized  Result Value Ref Range Status   Specimen Description URINE, CATHETERIZED  Final   Special Requests NONE  Final   Culture (A)  Final    <10,000 COLONIES/mL INSIGNIFICANT GROWTH Performed at [facility], [place] [address] [place], [place] [address]    Report Status 09/16/2020 FINAL  Final     Labs: Basic Metabolic Panel: Recent Labs  Lab 09/13/20 1208 09/14/20 0128 09/15/20 0128 09/16/20 0135  NA 136 136 133* 136  K 3.4* 4.1 4.0 4.0  CL 99 104 102 106  CO2 26 25 23 24   GLUCOSE 99 102* 101* 103*  BUN 18 19 29* 27*  CREATININE 1.01* 1.13* 1.32* 0.94  CALCIUM 9.9 9.3 8.8*  8.4*  MG  --   --   --  1.9   Liver Function Tests: No results for input(s): AST, ALT, ALKPHOS, BILITOT, PROT, ALBUMIN in the last 168 hours. No results for input(s): LIPASE, AMYLASE in the last 168 hours. No results for input(s): AMMONIA in the last 168 hours. CBC: Recent Labs  Lab 09/13/20 1208 09/14/20 0128 09/15/20 0955 09/16/20 0135  WBC 5.4 5.4 6.0 4.5  NEUTROABS  --   --  4.1  --   HGB 13.1 12.0 12.2 11.2*  HCT 40.2 35.3* 37.7 33.4*  MCV 84.6 83.5 85.3 83.5  PLT 147* 144* 144* 129*   Cardiac Enzymes: No results for input(s): CKTOTAL, CKMB, CKMBINDEX, TROPONINI in the last 168 hours. BNP: BNP (last 3 results) Recent Labs    09/13/20 1208  BNP 226.9*    ProBNP (last 3 results) No results for input(s): PROBNP  in the last 8760 hours.  CBG: No results for input(s): GLUCAP in the last 168 hours.     Signed:  Irine Seal MD.  [facility] 09/16/2020, 6:13 PM

## 2020-09-17 DIAGNOSIS — I480 Paroxysmal atrial fibrillation: Secondary | ICD-10-CM | POA: Diagnosis not present

## 2020-09-17 DIAGNOSIS — I10 Essential (primary) hypertension: Secondary | ICD-10-CM | POA: Diagnosis not present

## 2020-09-17 DIAGNOSIS — R0981 Nasal congestion: Secondary | ICD-10-CM | POA: Diagnosis not present

## 2020-09-17 DIAGNOSIS — R0789 Other chest pain: Secondary | ICD-10-CM | POA: Diagnosis not present

## 2020-09-17 DIAGNOSIS — N179 Acute kidney failure, unspecified: Secondary | ICD-10-CM | POA: Diagnosis not present

## 2020-09-17 DIAGNOSIS — I442 Atrioventricular block, complete: Secondary | ICD-10-CM | POA: Diagnosis not present

## 2020-09-17 DIAGNOSIS — R079 Chest pain, unspecified: Secondary | ICD-10-CM | POA: Diagnosis not present

## 2020-09-17 LAB — BASIC METABOLIC PANEL
Anion gap: 7 (ref 5–15)
BUN: 19 mg/dL (ref 8–23)
CO2: 21 mmol/L — ABNORMAL LOW (ref 22–32)
Calcium: 8.6 mg/dL — ABNORMAL LOW (ref 8.9–10.3)
Chloride: 106 mmol/L (ref 98–111)
Creatinine, Ser: 1 mg/dL (ref 0.44–1.00)
GFR, Estimated: 56 mL/min — ABNORMAL LOW (ref >60)
Glucose, Bld: 99 mg/dL (ref 70–99)
Potassium: 4 mmol/L (ref 3.5–5.1)
Sodium: 134 mmol/L — ABNORMAL LOW (ref 135–145)

## 2020-09-17 NOTE — Progress Notes (Signed)
Progress Note  Patient Name: [name] Date of Encounter: 09/17/2020  [facility] Cardiologist: Virl Axe, MD **  Subjective   No further chest pain or arm pain.  Nuclear stress test with no ischemia  Inpatient Medications    Scheduled Meds: . amLODipine  5 mg Oral Daily  . apixaban  5 mg Oral BID  . fluticasone  2 spray Each Nare Daily  . levothyroxine  100 mcg Oral Q0600  . lisinopril  40 mg Oral Daily  . metoprolol tartrate  25 mg Oral BID  . pantoprazole  40 mg Oral BID AC   Continuous Infusions:  PRN Meds: acetaminophen, alum & mag hydroxide-simeth **AND** lidocaine, diphenhydrAMINE, loratadine, morphine injection, nitroGLYCERIN, ondansetron (ZOFRAN) IV   Vital Signs    Vitals:   09/16/20 1922 09/16/20 2319 09/17/20 0557 09/17/20 0934  BP: 133/67 (!) 148/73 (!) 162/70 (!) 147/57  Pulse: 63  60   Resp: 14 19 16 20   Temp: 97.7 F (36.5 C) (!) 97.4 F (36.3 C) 98.3 F (36.8 C) 97.7 F (36.5 C)  TempSrc: Oral Oral Oral Oral  SpO2: 98% 99% 96% 100%  Weight:      Height:        Intake/Output Summary (Last 24 hours) at 09/17/2020 1008 Last data filed at 09/16/2020 2300 Gross per 24 hour  Intake 240 ml  Output --  Net 240 ml   Last 3 Weights 09/13/2020 09/13/2020 09/13/2020  Weight (lbs) 145 lb 15.1 oz 147 lb 147 lb 3.2 oz  Weight (kg) 66.2 kg 66.679 kg 66.769 kg      Telemetry    V paced rhythm - Personally Reviewed  ECG    No new EKG to review- Personally Reviewed  Physical Exam   GEN: Well nourished, well developed in no acute distress HEENT: Normal NECK: No JVD; No carotid bruits LYMPHATICS: No lymphadenopathy CARDIAC:RRR, no murmurs, rubs, gallops RESPIRATORY:  Clear to auscultation without rales, wheezing or rhonchi  ABDOMEN: Soft, non-tender, non-distended MUSCULOSKELETAL:  No edema; No deformity  SKIN: Warm and dry NEUROLOGIC:  Alert and oriented x 3 PSYCHIATRIC:  Normal affect    Labs    High Sensitivity Troponin:    Recent Labs  Lab 09/13/20 1208 09/13/20 1446  TROPONINIHS 12 14      Chemistry Recent Labs  Lab 09/15/20 0128 09/16/20 0135 09/17/20 0424  NA 133* 136 134*  K 4.0 4.0 4.0  CL 102 106 106  CO2 23 24 21*  GLUCOSE 101* 103* 99  BUN 29* 27* 19  CREATININE 1.32* 0.94 1.00  CALCIUM 8.8* 8.4* 8.6*  GFRNONAA 40* >60 56*  ANIONGAP 8 6 7      Hematology Recent Labs  Lab 09/14/20 0128 09/15/20 0955 09/16/20 0135  WBC 5.4 6.0 4.5  RBC 4.23 4.42 4.00  HGB 12.0 12.2 11.2*  HCT 35.3* 37.7 33.4*  MCV 83.5 85.3 83.5  MCH 28.4 27.6 28.0  MCHC 34.0 32.4 33.5  RDW 12.4 12.3 12.3  PLT 144* 144* 129*    BNP Recent Labs  Lab 09/13/20 1208  BNP 226.9*     DDimer No results for input(s): DDIMER in the last 168 hours.   CHA2DS2-VASc Score = 6   Radiology    NM Myocar Multi W/Spect W/Wall Motion / EF  Result Date: 09/16/2020 CLINICAL DATA:  Chest pain EXAM: MYOCARDIAL IMAGING WITH SPECT (REST AND PHARMACOLOGIC-STRESS) GATED LEFT VENTRICULAR WALL MOTION STUDY LEFT VENTRICULAR EJECTION FRACTION TECHNIQUE: Standard myocardial SPECT imaging was performed after resting  intravenous injection of 11 mCi Tc-9m Myoview. Subsequently, intravenous infusion of Lexiscan was performed under the supervision of the Cardiology staff. At peak effect of the drug, 32.7 mCi Tc-36m Myoview was injected intravenously and standard myocardial SPECT imaging was performed. Quantitative gated imaging was also performed to evaluate left ventricular wall motion, and estimate left ventricular ejection fraction. COMPARISON:  None. FINDINGS: Perfusion: Reduced activity in the inferior wall favors diaphragmatic attenuation based on the raw images, although scarring may cause a similar appearance. No inducible ischemia identified. Wall Motion: Apical hypokinesis.  No left ventricular dilation. Left Ventricular Ejection Fraction: 65 % End diastolic volume 51 ml End systolic volume 18 ml IMPRESSION: 1. Matched reduced  activity in the inferior wall is somewhat large, but probably from diaphragmatic attenuation rather than scarring given the reasonable wall motion in this vicinity. 2. Mild apical hypokinesis. 3. Left ventricular ejection fraction 65% 4. Non invasive risk stratification*: Low *2012 Appropriate Use Criteria for Coronary Revascularization Focused Update: J Am Coll Cardiol. 9629;52(8):413-244. http://[email or web address]?[email or web address] Electronically Signed   By: Van Clines M.D.   On: 09/16/2020 17:31    Cardiac Studies   2D echo IMPRESSIONS   1. Left ventricular ejection fraction, by estimation, is 55 to 60%. The  left ventricle has normal function. The left ventricle has no regional  wall motion abnormalities. There is mild asymmetric left ventricular  hypertrophy. Left ventricular diastolic  parameters are consistent with Grade II diastolic dysfunction  (pseudonormalization).  2. Right ventricular systolic function is normal. The right ventricular  size is normal. There is normal pulmonary artery systolic pressure.  3. The mitral valve is grossly normal. Mild mitral valve regurgitation.  4. Tricuspid valve regurgitation is mild to moderate.  5. The aortic valve is tricuspid. Aortic valve regurgitation is not  visualized.   Patient Profile     80 y.o. 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 Grandville Silos.  Assessment & Plan    1. Chest pain with mixed features, also recent GI illness - troponins negative - EKG paced so challenging to assess - chest pain relieved with GI measures, not specifically exertiona - 2D echo with  normal LVF and no focal wall motion abnormalities - general feeling is that this is more likely GI related as sx resolve with GI cocktail and belching but she also has left arm aching that is completely different from her GI symptoms - Abd US showed gallstones but no sonographic evidence of acute cholecystitis but would not explain left arm pain - given her bump in SCr and only having a single kidney will avoid coronary CTA - Lexiscan myoview showed no ischemia and normal LVF - no further cardiac workup  2. Paroxysmal atrial fibrillation (also prior h/o PPM) - chest pain does not appear to be related to arrhythmia as she has been maintaining NSR - on Eliquis PTA which has been continued here  4. Probable CKD stage IIIa - AKI with Scr bumped to 1.32 >>resolved and SCr 1 today -per [facility]  5. Essential HTN - Bp borderline elevated today at 147/40mmHg - continue Lisinopril 40mg  daily, Lopressor 25mg  BID, amlodipine 5mg  daily - diuretic stopped due to bump in SCr - cannot titrate  BB further due to HR in low 60's - increase amlodipine as needed for BP control    [facility] will sign off.   Medication Recommendations:  Lopressor 25mg  BID, Lisinopril 40mg  daily, Eliquis 5mg  BID, amlodipine>>discharge dose per [facility] Other recommendations (labs, testing, etc):  none Follow up as an outpatient:  She has followup in Cards office on 09/21/20  For questions or updates, please contact [place] Please consult [email or web address] for contact info under        Signed, Fransico Him, MD  09/17/2020, 10:08 AM

## 2020-09-17 NOTE — Discharge Summary (Signed)
Physician Discharge Summary  [name]:[id number] DOB: 03-18-1941 DOA: 09/13/2020  PCP: [name], DO  Admit date: 09/13/2020 Discharge date: 09/17/2020  Time spent: 50 minutes  Recommendations for Outpatient Follow-up:  1. Follow-up with Roma Schanz R, DO in 2 weeks.  On follow-up patient needs a basic metabolic profile done to follow-up on electrolytes and renal function.  Patient's blood pressure need to be reassessed as patient's HCTZ has been discontinued. 2. Follow-up with Dr. Cristina Gong, gastroenterology in 2 - 3 weeks. 3. Follow-up with Dr. Caryl Comes, cardiology as previously scheduled.   Discharge Diagnoses:  Principal Problem:   Chest pain Active Problems:   Essential hypertension   AV BLOCK, COMPLETE   Hypothyroidism, postsurgical   Paroxysmal atrial fibrillation (HCC)   Congestion of nasal sinus   AKI (acute kidney injury) ([place])   Left-sided chest pain   Discharge Condition: Stable and improved  Diet recommendation: Heart healthy  Filed Weights   09/13/20 1158 09/13/20 1700  Weight: 66.7 kg 66.2 kg    History of present illness:  HPI per Dr. Agapito Games [name] is a 80 y.o. female with medical history significant for paroxysmal atrial fibrillation on Eliquis, CHB s/p PPM, HTN, HLD, hypothyroidism who presented to [place] ED for evaluation of chest discomfort.  Patient reported new onset of sternal and left chest discomfort beginning on 09/10/2020.  Discomfort is described as a dull ache with radiation to her left shoulder and down her left arm, up her left neck, and towards her back.  Symptoms are intermittent but occur even at rest.  She has also had significant acid reflux/heartburn.  She says her symptoms feel like they improve when she belches.  On 09/11/2020 she developed frequent loose stools without significant watery diarrhea or bloody stools.  Loose stools are beginning to improve.  She notices becoming short of breath  when she walks up a flight of stairs in her home which is new.  She reports a chronic cough in the morning due to postnasal drip which is unchanged.  She denies any subjective fevers, chills, diaphoresis, nausea, vomiting, palpitations, dysuria, or peripheral edema.  [facility] ED Course:  Initial vitals showed BP 160/104, pulse 86, RR 18, temp 98.3 F, SPO2 100% on room air.  Labs show sodium 136, potassium 3.4, bicarb 26, BUN 18, creatinine 1.01, serum glucose 99, WBC 5.4, hemoglobin 13.1, platelets 147,000, BNP 226.9, high-sensitivity troponin I 12 > 14.  SARS-CoV-2 PCR was collected and pending.  2 view chest x-ray showed left-sided pacemaker in place without focal consolidation, edema, or effusion.  Patient was felt to have a heart score of 5.  The hospitalist service was consulted to admit for chest pain rule out.  Patient was given aspirin 324 mg once and started on Lopressor 25 mg p.o. twice daily prior to transfer to [facility] Course:  1 chest pain/left upper extremity pain Patient presented with chest pain with both typical and atypical features as well as left upper extremity aching pain.  High-sensitivity troponin negative x2.  EKG with with right bundle branch block, paced rhythm with no acute ischemic changes noted.  Patient low risk for PE by Wells and modified Geneva score with adherence to Eliquis. -Patient with multiple cardiac risk factors. -2D echo with a EF of 55 to 60%, no wall motion abnormalities, mild asymmetric left ventricular hypertrophy, grade 2 diastolic dysfunction, mild mitral valve regurgitation, mild to moderate tricuspid valvular  regurgitation.   -Abdominal ultrasound done with cholelithiasis without sonographic evidence of acute cholecystitis, status post left nephrectomy.   -Patient noted to have ongoing left upper extremity aching pain during the hospitalization which improved and had resolved by day of discharge.    -Patient stated chest discomfort improved with GI cocktail however develops belching and chest discomfort whenever she eats.   -Chest discomfort likely GI related secondary to GERD as patient with symptomatic improvement on PPI and GI cocktail -Patient seen by GI who was in agreement that chest discomfort likely a component of reflux and recommended continuation of PPI twice daily x6 weeks then PPI 40 mg daily indefinitely with outpatient follow-up with GI.  -Patient s/p Myoview Lexiscan scan showed no ischemia with normal LV function per cardiology.  Per cardiology no further cardiac work-up needed.   Patient maintained on home regimen Lopressor, lisinopril and Norvasc.   -Outpatient follow-up with primary cardiologist.   2.  Hypothyroidism Patient maintained on home regimen Synthroid.   3.  Hypertension Stable.    Patient maintained on home regimen Lopressor, lisinopril and Norvasc.  HCTZ was discontinued and not resumed on discharge.  Outpatient follow-up.  4.  Paroxysmal atrial fibrillation/history of CHB status post PPM Remained rate controlled on Lopressor.  Patient status post PPM.  Patient maintained on Eliquis for anticoagulation.  5.  Hypokalemia Likely secondary to HCTZ.  Repleted.    HCTZ discontinued on discharge.   6.  Congestion of nasal sinus Patient with complaints of nasal congestion and probable sinus headache.  Improved with symptomatic treatment.  Patient maintained on Flonase, Reglan as needed, morphine as needed for significant headache.  Outpatient follow-up.  7.  Acute kidney injury/status post left nephrectomy Patient noted to have a bump in the creatinine up to 1.32 on 09/15/2020.  Secondary to prerenal azotemia.  BUN and creatinine elevated.  Urine sodium < 10.  Urinalysis unremarkable.  Patient hydrated with IV fluids with improvement with renal function.  Renal function was back to baseline by day of discharge.    Procedures:  2D echo  09/14/2020  Chest x-ray 09/13/2020  Abdominal ultrasound 09/14/2020   Lexiscan Myoview 09/16/2020   Consultations:  Cardiology Dr. Radford Pax 09/14/2020  Gastroenterology: Dr. Paulita Fujita 09/15/2020    Discharge Exam: Vitals:   09/17/20 0557 09/17/20 0934  BP: (!) 162/70 (!) 147/57  Pulse: 60   Resp: 16 20  Temp: 98.3 F (36.8 C) 97.7 F (36.5 C)  SpO2: 96% 100%    General: NAD Cardiovascular: RRR Respiratory: CTAB  Discharge Instructions   Discharge Instructions    Diet - low sodium heart healthy   Complete by: As directed    Increase activity slowly   Complete by: As directed    Increase activity slowly   Complete by: As directed      Allergies as of 09/17/2020      Reactions   Tape Rash   Codeine Nausea And Vomiting      Medication List    STOP taking these medications   hydrochlorothiazide 12.5 MG tablet Commonly known as: HYDRODIURIL     TAKE these medications   acetaminophen 500 MG tablet Commonly known as: TYLENOL Take 1,000 mg by mouth daily as needed for moderate pain.   amLODipine 5 MG tablet Commonly known as: NORVASC Take 1 tablet (5 mg total) by mouth daily.   apixaban 5 MG Tabs tablet Commonly known as: ELIQUIS Take 1 tablet (5 mg total) by mouth 2 (two) times daily.   Coricidin  D Cold/Flu/Sinus 2-5-325 MG Tabs Generic drug: Chlorphen-Phenyleph-APAP Take 2 tablets by mouth daily.   denosumab 60 MG/ML Sosy injection Commonly known as: PROLIA Inject 60 mg into the skin every 6 (six) months.   diclofenac Sodium 1 % Gel Commonly known as: VOLTAREN Apply 4 g topically 4 (four) times daily.   diphenhydramine-acetaminophen 25-500 MG Tabs tablet Commonly known as: TYLENOL PM Take 2 tablets by mouth at bedtime as needed (sleep/pain).   fluticasone 50 MCG/ACT nasal spray Commonly known as: FLONASE PLACE 2 SPRAYS INTO BOTH NOSTRILS DAILY AS NEEDED FOR ALLERGIES OR RHINITIS.   levocetirizine 5 MG tablet Commonly known as: XYZAL Take 1  tablet (5 mg total) by mouth every evening.   levothyroxine 100 MCG tablet Commonly known as: SYNTHROID TAKE 1 TABLET BY MOUTH ONCE DAILY BEFORE BREAKFAST . What changed:   how much to take  how to take this  when to take this   lisinopril 40 MG tablet Commonly known as: ZESTRIL TAKE 1 TABLET (40 MG TOTAL) BY MOUTH DAILY.   loratadine 10 MG tablet Commonly known as: CLARITIN Take 1 tablet (10 mg total) by mouth daily as needed for allergies.   metoprolol tartrate 25 MG tablet Commonly known as: LOPRESSOR Take 1 tablet (25 mg total) by mouth 2 (two) times daily.   pantoprazole 40 MG tablet Commonly known as: PROTONIX Take 1 tablet (40 mg total) by mouth 2 (two) times daily before a meal.   tiZANidine 4 MG capsule Commonly known as: ZANAFLEX Take 1 capsule (4 mg total) by mouth 3 (three) times daily as needed for muscle spasms.      Allergies  Allergen Reactions  . Tape Rash  . Codeine Nausea And Vomiting    Follow-up Information    [name], DO. Schedule an appointment as soon as possible for a visit in 2 week(s).   Specialty: Family Medicine Contact information: [phone] [address] [place] [address] [phone]        Deboraha Sprang, MD .   Specialty: Cardiology Contact information: [phone] [address] [place] [address] [phone]        Ronald Lobo, MD. Schedule an appointment as soon as possible for a visit in 2 week(s).   Specialty: Gastroenterology Why: F/U IN 2-3 WEEKS. Contact information: [address] [place] [address] [phone]                The results of significant diagnostics from this hospitalization (including imaging, microbiology, ancillary and laboratory) are listed below for reference.    Significant Diagnostic Studies: DG Chest 2 View  Result Date: 09/13/2020 CLINICAL DATA:  Chest pain. Additional history provided: Chest pain and left arm pain since  Sunday, worsening last night, dizziness when standing. EXAM: CHEST - 2 VIEW COMPARISON:  CT chest 03/16/2018.  Chest radiographs 07/30/2016. FINDINGS: Redemonstrated left chest dual lead pacer device. Heart size within normal limits. Aortic atherosclerosis. No appreciable airspace consolidation. No evidence of pleural effusion or pneumothorax. No acute bony abnormality is identified. Surgical clips within the lower neck. Multiple previously augmented vertebral compression fractures within the thoracic and lumbar spine. IMPRESSION: No evidence of acute cardiopulmonary abnormality. Aortic Atherosclerosis (ICD10-I70.0). Electronically Signed   By: Kellie Simmering DO   On: 09/13/2020 12:48   US Abdomen Complete  Result Date: 09/15/2020 CLINICAL DATA:  Abdominal pain EXAM: ABDOMEN ULTRASOUND COMPLETE COMPARISON:  Chest x-ray 09/13/2020, CT 07/12/2015 FINDINGS: Gallbladder: Small gallstone. Normal wall thickness. Negative  sonographic Murphy Common bile duct: Diameter: 2.2 mm Liver: No focal lesion identified. Within normal limits in parenchymal echogenicity. Portal vein is patent on color Doppler imaging with normal direction of blood flow towards the liver. IVC: No abnormality visualized. Pancreas: Visualized portion unremarkable. Spleen: Upper normal in size. Right Kidney: Length: 10.7 cm. Echogenicity within normal limits. No mass or hydronephrosis visualized. Left Kidney: Length: Surgically absent. Abdominal aorta: No aneurysm visualized. Other findings: None. IMPRESSION: 1. Status post left nephrectomy. 2. Cholelithiasis without sonographic evidence for acute cholecystitis Electronically Signed   By: Donavan Foil M.D.   On: 09/15/2020 00:10   NM Myocar Multi W/Spect W/Wall Motion / EF  Result Date: 09/16/2020 CLINICAL DATA:  Chest pain EXAM: MYOCARDIAL IMAGING WITH SPECT (REST AND PHARMACOLOGIC-STRESS) GATED LEFT VENTRICULAR WALL MOTION STUDY LEFT VENTRICULAR EJECTION FRACTION TECHNIQUE: Standard myocardial  SPECT imaging was performed after resting intravenous injection of 11 mCi Tc-15m Myoview. Subsequently, intravenous infusion of Lexiscan was performed under the supervision of the Cardiology staff. At peak effect of the drug, 32.7 mCi Tc-57m Myoview was injected intravenously and standard myocardial SPECT imaging was performed. Quantitative gated imaging was also performed to evaluate left ventricular wall motion, and estimate left ventricular ejection fraction. COMPARISON:  None. FINDINGS: Perfusion: Reduced activity in the inferior wall favors diaphragmatic attenuation based on the raw images, although scarring may cause a similar appearance. No inducible ischemia identified. Wall Motion: Apical hypokinesis.  No left ventricular dilation. Left Ventricular Ejection Fraction: 65 % End diastolic volume 51 ml End systolic volume 18 ml IMPRESSION: 1. Matched reduced activity in the inferior wall is somewhat large, but probably from diaphragmatic attenuation rather than scarring given the reasonable wall motion in this vicinity. 2. Mild apical hypokinesis. 3. Left ventricular ejection fraction 65% 4. Non invasive risk stratification*: Low *2012 Appropriate Use Criteria for Coronary Revascularization Focused Update: J Am Coll Cardiol. 1607;37(1):062-694. http://content.airportbarriers.com.aspx?articleid=[email or web address] Electronically Signed   By: Van Clines M.D.   On: 09/16/2020 17:31   ECHOCARDIOGRAM COMPLETE  Result Date: 09/14/2020    ECHOCARDIOGRAM REPORT   Patient Name:   [name] Date of Exam: 09/14/2020 Medical Rec #:  [id number]       Height:       63.0 in Accession #:    [id number]      Weight:       145.9 lb Date of Birth:  Jan 27, 1941        BSA:          1.691 m Patient Age:    18 years        BP:           126/53 mmHg Patient Gender: F               HR:           63 bpm. Exam Location:  Inpatient Procedure: 2D Echo, Cardiac Doppler and Color Doppler Indications:     R07.9* Chest pain, unspecified   History:         Patient has prior history of Echocardiogram examinations, most                  recent 10/12/2014.  Sonographer:     Merrie Roof RDCS Referring Phys:  [id number] Cleaster Corin PATEL Diagnosing Phys: Mertie Moores MD IMPRESSIONS  1. Left ventricular ejection fraction, by estimation, is 55 to 60%. The left ventricle has normal function. The left ventricle has no regional wall motion abnormalities. There is mild asymmetric left ventricular hypertrophy.  Left ventricular diastolic parameters are consistent with Grade II diastolic dysfunction (pseudonormalization).  2. Right ventricular systolic function is normal. The right ventricular size is normal. There is normal pulmonary artery systolic pressure.  3. The mitral valve is grossly normal. Mild mitral valve regurgitation.  4. Tricuspid valve regurgitation is mild to moderate.  5. The aortic valve is tricuspid. Aortic valve regurgitation is not visualized. FINDINGS  Left Ventricle: Left ventricular ejection fraction, by estimation, is 55 to 60%. The left ventricle has normal function. The left ventricle has no regional wall motion abnormalities. The left ventricular internal cavity size was normal in size. There is  mild asymmetric left ventricular hypertrophy. Left ventricular diastolic parameters are consistent with Grade II diastolic dysfunction (pseudonormalization). Right Ventricle: The right ventricular size is normal. No increase in right ventricular wall thickness. Right ventricular systolic function is normal. There is normal pulmonary artery systolic pressure. The tricuspid regurgitant velocity is 2.65 m/s, and  with an assumed right atrial pressure of 3 mmHg, the estimated right ventricular systolic pressure is 74.9 mmHg. Left Atrium: Left atrial size was normal in size. Right Atrium: Right atrial size was normal in size. Pericardium: There is no evidence of pericardial effusion. Mitral Valve: The mitral valve is grossly normal. Mild mitral valve  regurgitation. Tricuspid Valve: The tricuspid valve is grossly normal. Tricuspid valve regurgitation is mild to moderate. Aortic Valve: The aortic valve is tricuspid. Aortic valve regurgitation is not visualized. Aortic valve mean gradient measures 3.0 mmHg. Aortic valve peak gradient measures 6.5 mmHg. Aortic valve area, by VTI measures 2.65 cm. Pulmonic Valve: The pulmonic valve was normal in structure. Pulmonic valve regurgitation is not visualized. Aorta: The aortic root and ascending aorta are structurally normal, with no evidence of dilitation. IAS/Shunts: The atrial septum is grossly normal. Additional Comments: A device lead is visualized.  LEFT VENTRICLE PLAX 2D LVIDd:         4.10 cm  Diastology LVIDs:         2.80 cm  LV e' medial:    6.85 cm/s LV PW:         0.80 cm  LV E/e' medial:  12.0 LV IVS:        1.20 cm  LV e' lateral:   10.20 cm/s LVOT diam:     1.80 cm  LV E/e' lateral: 8.1 LV SV:         73 LV SV Index:   43 LVOT Area:     2.54 cm  RIGHT VENTRICLE          IVC RV Basal diam:  3.20 cm  IVC diam: 1.70 cm LEFT ATRIUM             Index       RIGHT ATRIUM           Index LA diam:        3.60 cm 2.13 cm/m  RA Area:     15.10 cm LA Vol (A2C):   53.5 ml 31.63 ml/m RA Volume:   42.90 ml  25.37 ml/m LA Vol (A4C):   26.3 ml 15.55 ml/m LA Biplane Vol: 38.6 ml 22.82 ml/m  AORTIC VALVE AV Area (Vmax):    2.46 cm AV Area (Vmean):   2.77 cm AV Area (VTI):     2.65 cm AV Vmax:           127.00 cm/s AV Vmean:          86.200 cm/s AV VTI:  0.275 m AV Peak Grad:      6.5 mmHg AV Mean Grad:      3.0 mmHg LVOT Vmax:         123.00 cm/s LVOT Vmean:        94.000 cm/s LVOT VTI:          0.286 m LVOT/AV VTI ratio: 1.04  AORTA Ao Root diam: 2.90 cm Ao Asc diam:  3.80 cm MITRAL VALVE               TRICUSPID VALVE MV Area (PHT): 4.60 cm    TR Peak grad:   28.1 mmHg MV Decel Time: 165 msec    TR Vmax:        265.00 cm/s MV E velocity: 82.30 cm/s MV A velocity: 90.40 cm/s  SHUNTS MV E/A ratio:  0.91         Systemic VTI:  0.29 m                            Systemic Diam: 1.80 cm Mertie Moores MD Electronically signed by Mertie Moores MD Signature Date/Time: 09/14/2020/12:36:32 PM    Final (Updated)     Microbiology: Recent Results (from the past 240 hour(s))  SARS CORONAVIRUS 2 (TAT 6-24 HRS) Nasopharyngeal Nasopharyngeal Swab     Status: None   Collection Time: 09/13/20  2:03 PM   Specimen: Nasopharyngeal Swab  Result Value Ref Range Status   SARS Coronavirus 2 NEGATIVE NEGATIVE Final    Comment: (NOTE) SARS-CoV-2 target nucleic acids are NOT DETECTED.  The SARS-CoV-2 RNA is generally detectable in upper and lower respiratory specimens during the acute phase of infection. Negative results do not preclude SARS-CoV-2 infection, do not rule out co-infections with other pathogens, and should not be used as the sole basis for treatment or other patient management decisions. Negative results must be combined with clinical observations, patient history, and epidemiological information. The expected result is Negative.  Fact Sheet for Patients: SugarRoll.be  Fact Sheet for Healthcare Providers: https://www.woods-mathews.com/  This test is not yet approved or cleared by the [place] FDA and  has been authorized for detection and/or diagnosis of SARS-CoV-2 by FDA under an Emergency Use Authorization (EUA). This EUA will remain  in effect (meaning this test can be used) for the duration of the COVID-19 declaration under Se ction 564(b)(1) of the Act, 21 U.S.C. section 360bbb-3(b)(1), unless the authorization is terminated or revoked sooner.  Performed at [facility], [place] [address] [place], [place] [address]   Culture, Urine     Status: Abnormal   Collection Time: 09/15/20  9:51 AM   Specimen: Urine, Catheterized  Result Value Ref Range Status   Specimen Description URINE, CATHETERIZED  Final   Special Requests NONE  Final    Culture (A)  Final    <10,000 COLONIES/mL INSIGNIFICANT GROWTH Performed at [facility], [place] [address] [place], [place] [address]    Report Status 09/16/2020 FINAL  Final     Labs: Basic Metabolic Panel: Recent Labs  Lab 09/13/20 1208 09/14/20 0128 09/15/20 0128 09/16/20 0135 09/17/20 0424  NA 136 136 133* 136 134*  K 3.4* 4.1 4.0 4.0 4.0  CL 99 104 102 106 106  CO2 26 25 23 24  21*  GLUCOSE 99 102* 101* 103* 99  BUN 18 19 29* 27* 19  CREATININE 1.01* 1.13* 1.32* 0.94 1.00  CALCIUM 9.9 9.3 8.8* 8.4* 8.6*  MG  --   --   --  1.9  --    Liver Function Tests: No results for input(s): AST, ALT, ALKPHOS, BILITOT, PROT, ALBUMIN in the last 168 hours. No results for input(s): LIPASE, AMYLASE in the last 168 hours. No results for input(s): AMMONIA in the last 168 hours. CBC: Recent Labs  Lab 09/13/20 1208 09/14/20 0128 09/15/20 0955 09/16/20 0135  WBC 5.4 5.4 6.0 4.5  NEUTROABS  --   --  4.1  --   HGB 13.1 12.0 12.2 11.2*  HCT 40.2 35.3* 37.7 33.4*  MCV 84.6 83.5 85.3 83.5  PLT 147* 144* 144* 129*   Cardiac Enzymes: No results for input(s): CKTOTAL, CKMB, CKMBINDEX, TROPONINI in the last 168 hours. BNP: BNP (last 3 results) Recent Labs    09/13/20 1208  BNP 226.9*    ProBNP (last 3 results) No results for input(s): PROBNP in the last 8760 hours.  CBG: No results for input(s): GLUCAP in the last 168 hours.     Signed:  Irine Seal MD.  [facility] 09/17/2020, 11:51 AM

## 2020-09-17 NOTE — Progress Notes (Signed)
D/c instructions, medications and follow up care reviewed with patient and she verbalized understanding. A&Ox4.

## 2020-09-17 NOTE — Plan of Care (Signed)

## 2020-09-18 ENCOUNTER — Telehealth: Payer: Self-pay

## 2020-09-18 NOTE — Telephone Encounter (Signed)
I have made the 1st attempt to contact the patient or family member in charge, in order to follow up from recently being discharged from the hospital. I left a message on voicemail requesting a CB. -MM 

## 2020-09-18 NOTE — Progress Notes (Unsigned)
Cardiology Office Note Date:  09/18/2020  Patient ID:  [name], [name] 20-Apr-1941, MRN [id number] PCP:  [name], DO  Cardiologist:  Dr. Caryl Comes   Chief Complaint: annual visit, ?? Post hospital   History of Present Illness: [name] is a 80 y.o. female with history of CHB w/PPM, HTN, hypothyroid s/p thyroidectomy, renal cancer/nephrectomy (left), AFib, NSVT, CKD (III)  She comes in today to be seen for Dr. Caryl Comes, last seen by him 07/23/19, she was doing well, planned to do an echo the following year to monitor for PM mediated CM.  BP was up but well controlled at home No changes were made.  She was hospitalized 09/13/20 for symptoms initially of GI sense with stomach upset and loose stools > CP.  GI coctain in the ER improved her sx. cardiology consulted, felt to have atypical symptoms for cardiac etiology though c/o ongoing L arm aching and planned for ischemic w/u that noted no ischemia, cardiology signed off 09/17/20. LVEF 55-60%, grade II DD, mod TR GI suspected GERD Discharged 09/17/20 Her HCTZ d/c 2/2 AKI/CKD, planned for GI follow up  TODAY She is feeling much better since home, no ongoing GI issues She has a GI visit scheduled, can not recall when, no PMD appt yet. She denies any cardiac concerns or awareness. No CP, palpitations No SOB, DOE No dizziness, near syncope or syncope No bleeding or signs of bleeding   Device information: MDT dual chamber PPM, implanted 2004, gen change 09/16/11, Dr. Lovena Le, CHB Dr. Olin Pia note mentions Ventricular failure to pace/polarity switch (notes for years mentions this) Is programmed unipolar DEVICE DEPENDEDNT   Past Medical History:  Diagnosis Date  . Atrioventricular block, complete ([place])    a. 08/2011 Upgrade of PPM to MDT [place] L Dual Chamber PPM ser # [id number].  . Cardiac pacemaker in situ   . Contact dermatitis and eczema    unspec cause  . Dairy product intolerance   . Depression   . Diastolic  dysfunction   . GERD (gastroesophageal reflux disease)   . Hiatal hernia   . History of diverticulitis of colon    2003  perforated diverticulitis w/ surgical intervention  . History of kidney stones    age 80  . History of peripheral edema    lower extremities  . History of thyroid nodule    multinodular goiter  s/p  total thyroidectomy 2014  . HTN (hypertension)   . Hydronephrosis, left   . Hypothyroidism   . Mild sleep apnea    per study 04-14-2014  . Nocturia   . NSVT (nonsustained ventricular tachycardia) ([place])   . Osteoporosis   . PAF (paroxysmal atrial fibrillation) ([place])   . PONV (postoperative nausea and vomiting)   . Pulmonary nodules    stable per last ct  . Renal cell carcinoma of left kidney ([facility])   . Superficial thrombophlebitis     Past Surgical History:  Procedure Laterality Date  . CARDIOVASCULAR STRESS TEST  04-20-2015  dr Caryl Comes   normal nuclear study/  no ischemia/  normal LV function and wall motion, ef 74%  . COLONOSCOPY  01/19/2013  . COLOSTOMY TAKEDOWN  07-22-2002  . CYSTOSCOPY WITH RETROGRADE PYELOGRAM, URETEROSCOPY AND STENT PLACEMENT Left 07/28/2015   Procedure: CYSTOSCOPY WITH LEFT RETROGRADE PYELOGRAM, POSSIBLE LEFT URETEROSCOPY AND STENT PLACEMENT;  Surgeon: Franchot Gallo, MD;  Location: [facility];  Service: Urology;  Laterality: Left;  . EXPLORATORY LAPARTOMY SIGMOID COLECTOMY/ COLOSTOMY  04-28-2002  perforated diverticulitis  . KYPHOPLASTY    . LAPAROSCOPY VENTRAL HERNIA REPAIR/  EXTENSIVE LYSIS ADHESIONS  05-04-2004  . PACEMAKER GENERATOR CHANGE Left 09/16/2011   MDT [place] pacemaker  . PACEMAKER PLACEMENT  05-11-2003     medtronic  . ROBOT ASSISTED LAPAROSCOPIC NEPHRECTOMY Left 03/20/2016   Procedure: XI ROBOTIC ASSISTED LAPAROSCOPIC RETROPERITONEAL NEPHRECTOMY;  Surgeon: Alexis Frock, MD;  Location: [facility];  Service: Urology;  Laterality: Left;  . THYROIDECTOMY N/A 08/07/2012   Procedure: THYROIDECTOMY;  Surgeon: Earnstine Regal, MD;  Location: [facility];  Service: General;  Laterality: N/A;  . TRANSTHORACIC ECHOCARDIOGRAM  10-12-2014   moderate focal basal LVH,  ef 55-60%,  grade 2 diastolic dysfunction, mild AV calcification without stenosis,  mild MR,  trivial PR    Current Outpatient Medications  Medication Sig Dispense Refill  . acetaminophen (TYLENOL) 500 MG tablet Take 1,000 mg by mouth daily as needed for moderate pain.    [name] amLODipine (NORVASC) 5 MG tablet Take 1 tablet (5 mg total) by mouth daily. 90 tablet 1  . apixaban (ELIQUIS) 5 MG TABS tablet Take 1 tablet (5 mg total) by mouth 2 (two) times daily. 180 tablet 1  . Chlorphen-Phenyleph-APAP (CORICIDIN D COLD/FLU/SINUS) 2-5-325 MG TABS Take 2 tablets by mouth daily.    [name] denosumab (PROLIA) 60 MG/ML SOSY injection Inject 60 mg into the skin every 6 (six) months.    . diclofenac Sodium (VOLTAREN) 1 % GEL Apply 4 g topically 4 (four) times daily. 100 g 2  . diphenhydramine-acetaminophen (TYLENOL PM) 25-500 MG TABS tablet Take 2 tablets by mouth at bedtime as needed (sleep/pain).    . fluticasone (FLONASE) 50 MCG/ACT nasal spray PLACE 2 SPRAYS INTO BOTH NOSTRILS DAILY AS NEEDED FOR ALLERGIES OR RHINITIS. 16 g 2  . levocetirizine (XYZAL) 5 MG tablet Take 1 tablet (5 mg total) by mouth every evening. 90 tablet 3  . levothyroxine (SYNTHROID) 100 MCG tablet TAKE 1 TABLET BY MOUTH ONCE DAILY BEFORE BREAKFAST . (Patient taking differently: Take 100 mcg by mouth daily before breakfast. TAKE 1 TABLET BY MOUTH ONCE DAILY BEFORE BREAKFAST .) 90 tablet 3  . lisinopril (ZESTRIL) 40 MG tablet TAKE 1 TABLET (40 MG TOTAL) BY MOUTH DAILY. 90 tablet 1  . loratadine (CLARITIN) 10 MG tablet Take 1 tablet (10 mg total) by mouth daily as needed for allergies. 100 tablet 2  . metoprolol tartrate (LOPRESSOR) 25 MG tablet Take 1 tablet (25 mg total) by mouth 2 (two) times daily. 60 tablet 2  . pantoprazole (PROTONIX) 40 MG tablet Take 1 tablet (40 mg total) by mouth 2 (two) times  daily before a meal. 60 tablet 2  . tiZANidine (ZANAFLEX) 4 MG capsule Take 1 capsule (4 mg total) by mouth 3 (three) times daily as needed for muscle spasms. (Patient not taking: No sig reported) 30 capsule 1   No current facility-administered medications for this visit.   Facility-Administered Medications Ordered in Other Visits  Medication Dose Route Frequency Provider Last Rate Last Admin  . aminophylline injection 150 mg  150 mg Intravenous BID PRN Dorothy Spark, MD   150 mg at 04/20/15 1200    Allergies:   Tape and Codeine   Social History:  The patient  reports that she quit smoking about 40 years ago. Her smoking use included cigarettes. She quit after 15.00 years of use. She has never used smokeless tobacco. She reports that she does not drink alcohol and does not use drugs.   Family  History:  The patient's family history includes Cancer in her son; Diverticulosis in her sister; Heart disease in her brother and mother; Heart failure in her father; Hypertension in her brother, brother, and mother; Stroke in her mother.  ROS:  Please see the history of present illness.  All other systems are reviewed and otherwise negative.   PHYSICAL EXAM:  VS:  There were no vitals taken for this visit. BMI: There is no height or weight on file to calculate BMI. Well nourished, well developed, in no acute distress  HEENT: normocephalic, atraumatic  Neck: no JVD, carotid bruits or masses Cardiac:  RRR; no significant murmurs, no rubs, or gallops Lungs:  CTA b/l, no wheezing, rhonchi or rales  Abd: soft, nontender MS: no deformity or atrophy Ext: no edema  Skin: warm and dry, no rash Neuro:  No gross deficits appreciated Psych: euthymic mood, full affect  PPM site is stable, no tethering or discomfort   EKG:  Not done today  PPM interrogation done today and reviewed by myself:  Battery and lead measurements stable  RA lead is unipolar and measurements stable + AMS Burden  <0.1% Longest 9 minutes EGMs c/w PACs    09/16/20: stress myoview IMPRESSION: 1. Matched reduced activity in the inferior wall is somewhat large, but probably from diaphragmatic attenuation rather than scarring given the reasonable wall motion in this vicinity. 2. Mild apical hypokinesis. 3. Left ventricular ejection fraction 65% 4. Non invasive risk stratification*: Low   09/14/2020; TTE IMPRESSIONS  1. Left ventricular ejection fraction, by estimation, is 55 to 60%. The  left ventricle has normal function. The left ventricle has no regional  wall motion abnormalities. There is mild asymmetric left ventricular  hypertrophy. Left ventricular diastolic  parameters are consistent with Grade II diastolic dysfunction  (pseudonormalization).  2. Right ventricular systolic function is normal. The right ventricular  size is normal. There is normal pulmonary artery systolic pressure.  3. The mitral valve is grossly normal. Mild mitral valve regurgitation.  4. Tricuspid valve regurgitation is mild to moderate.  5. The aortic valve is tricuspid. Aortic valve regurgitation is not  visualized.    10/12/14: TTE Study Conclusions - Left ventricle: The cavity size was normal. There was moderate focal basal hypertrophy. Systolic function was normal. The estimated ejection fraction was in the range of 55% to 60%. Wall motion was normal; there were no regional wall motion abnormalities. Features are consistent with a pseudonormal left ventricular filling pattern, with concomitant abnormal relaxation and increased filling pressure (grade 2 diastolic dysfunction). - Aortic valve: Mildly calcified annulus. Trileaflet. Thickening. Transvalvular velocity was within the normal range. There was no stenosis. There was no regurgitation. - Mitral valve: Transvalvular velocity was within the normal range. There was no evidence for stenosis. There was mild regurgitation. -  Pulmonic valve: There was trivial regurgitation.  10/16 Myoview normal LV function and no ischemia   Recent Labs: 09/13/2020: B Natriuretic Peptide 226.9 09/16/2020: Hemoglobin 11.2; Magnesium 1.9; Platelets 129 09/17/2020: BUN 19; Creatinine, Ser 1.00; Potassium 4.0; Sodium 134  No results found for requested labs within last 8760 hours.   Estimated Creatinine Clearance: 39.6 mL/min (by C-G formula based on SCr of 1 mg/dL).   Wt Readings from Last 3 Encounters:  09/13/20 145 lb 15.1 oz (66.2 kg)  09/13/20 147 lb 3.2 oz (66.8 kg)  07/10/20 145 lb (65.8 kg)     Other studies reviewed: Additional studies/records reviewed today include: summarized above  ASSESSMENT AND PLAN:  1. CHB  w/PPM     Intact function, no programming changes made  2. New Paroxysmal Afib     CHA2DS2Vasc is 4, on Eliquis, appropriately dosed     <0.1 % burden      3. HTN     No changes today  Will follow up a BMET today given she has not yet gotten a PMD visit lined up Remains off HCTZ   Disposition: remotes as usual, in clinic in one year, sooner if needed  Current medicines are reviewed at length with the patient today.  The patient did not have any concerns regarding medicines.  Haywood Lasso, PA-C 09/18/2020 10:41 AM     [facility] [address] [place] [address] [phone] (office)  [phone] (fax)

## 2020-09-19 NOTE — Telephone Encounter (Signed)
I have made the 2nd attempt to contact the patient or family member in charge, in order to follow up from recently being discharged from the hospital. I left a message on voicemail requesting a CB. -MM  

## 2020-09-20 NOTE — Telephone Encounter (Signed)
I have tried to contact this pt on two separate occassions and left a VM requesting a CB. Pt has not returned my calls or VM. No HFU scheduled at this time. FYI to PCP. -MM

## 2020-09-21 ENCOUNTER — Ambulatory Visit (INDEPENDENT_AMBULATORY_CARE_PROVIDER_SITE_OTHER): Payer: Medicare Other | Admitting: Physician Assistant

## 2020-09-21 ENCOUNTER — Encounter: Payer: Self-pay | Admitting: Physician Assistant

## 2020-09-21 ENCOUNTER — Other Ambulatory Visit: Payer: Self-pay

## 2020-09-21 VITALS — BP 140/62 | HR 64 | Ht 63.0 in | Wt 146.0 lb

## 2020-09-21 DIAGNOSIS — Z79899 Other long term (current) drug therapy: Secondary | ICD-10-CM | POA: Diagnosis not present

## 2020-09-21 DIAGNOSIS — I480 Paroxysmal atrial fibrillation: Secondary | ICD-10-CM | POA: Diagnosis not present

## 2020-09-21 DIAGNOSIS — Z950 Presence of cardiac pacemaker: Secondary | ICD-10-CM | POA: Diagnosis not present

## 2020-09-21 LAB — BASIC METABOLIC PANEL
BUN/Creatinine Ratio: 14 (ref 12–28)
BUN: 17 mg/dL (ref 8–27)
CO2: 22 mmol/L (ref 20–29)
Calcium: 9.6 mg/dL (ref 8.7–10.3)
Chloride: 100 mmol/L (ref 96–106)
Creatinine, Ser: 1.24 mg/dL — ABNORMAL HIGH (ref 0.57–1.00)
Glucose: 98 mg/dL (ref 65–99)
Potassium: 4.4 mmol/L (ref 3.5–5.2)
Sodium: 138 mmol/L (ref 134–144)
eGFR: 43 mL/min/{1.73_m2} — ABNORMAL LOW (ref >59)

## 2020-09-21 LAB — CUP PACEART INCLINIC DEVICE CHECK
Battery Impedance: 1949 Ohm
Battery Remaining Longevity: 31 mo
Battery Voltage: 2.74 V
Brady Statistic AP VP Percent: 10 %
Brady Statistic AP VS Percent: 0 %
Brady Statistic AS VP Percent: 90 %
Brady Statistic AS VS Percent: 0 %
Date Time Interrogation Session: 20220324152046
Implantable Lead Implant Date: 20041110
Implantable Lead Implant Date: 20041110
Implantable Lead Location: 753859
Implantable Lead Location: 753860
Implantable Pulse Generator Implant Date: 20130318
Lead Channel Impedance Value: 379 Ohm
Lead Channel Impedance Value: 486 Ohm
Lead Channel Pacing Threshold Amplitude: 0.625 V
Lead Channel Pacing Threshold Amplitude: 0.625 V
Lead Channel Pacing Threshold Amplitude: 0.75 V
Lead Channel Pacing Threshold Amplitude: 0.75 V
Lead Channel Pacing Threshold Pulse Width: 0.4 ms
Lead Channel Pacing Threshold Pulse Width: 0.4 ms
Lead Channel Pacing Threshold Pulse Width: 0.4 ms
Lead Channel Pacing Threshold Pulse Width: 0.4 ms
Lead Channel Sensing Intrinsic Amplitude: 4 mV
Lead Channel Setting Pacing Amplitude: 2 V
Lead Channel Setting Pacing Amplitude: 2.5 V
Lead Channel Setting Pacing Pulse Width: 0.4 ms
Lead Channel Setting Sensing Sensitivity: 4 mV

## 2020-09-21 NOTE — Patient Instructions (Signed)
Medication Instructions:   Your physician recommends that you continue on your current medications as directed. Please refer to the Current Medication list given to you today. *If you need a refill on your cardiac medications before your next appointment, please call your pharmacy*   Lab Work: BMET TODAY    If you have labs (blood work) drawn today and your tests are completely normal, you will receive your results only by: [name] MyChart Message (if you have MyChart) OR . A paper copy in the mail If you have any lab test that is abnormal or we need to change your treatment, we will call you to review the results.   Testing/Procedures: NONE ORDERED  TODAY    Follow-Up: At [facility], you and your health needs are our priority.  As part of our continuing mission to provide you with exceptional heart care, we have created designated Provider Care Teams.  These Care Teams include your primary Cardiologist (physician) and Advanced Practice Providers (APPs -  Physician Assistants and Nurse Practitioners) who all work together to provide you with the care you need, when you need it.  We recommend signing up for the patient portal called "MyChart".  Sign up information is provided on this After Visit Summary.  MyChart is used to connect with patients for Virtual Visits (Telemedicine).  Patients are able to view lab/test results, encounter notes, upcoming appointments, etc.  Non-urgent messages can be sent to your provider as well.   To learn more about what you can do with MyChart, go to [email or web address].    Your next appointment:   1 year(s)  The format for your next appointment:   In Person  Provider:   You may see Dr. Caryl Comes  or one of the following Advanced Practice Providers on your designated Care Team:    Chanetta Marshall, NP  Tommye Standard, PA-C  Legrand Como "Oda Kilts, [place]    Other Instructions

## 2020-09-22 ENCOUNTER — Other Ambulatory Visit: Payer: Self-pay | Admitting: *Deleted

## 2020-09-22 DIAGNOSIS — Z79899 Other long term (current) drug therapy: Secondary | ICD-10-CM

## 2020-09-29 ENCOUNTER — Other Ambulatory Visit: Payer: Medicare Other | Admitting: *Deleted

## 2020-09-29 ENCOUNTER — Other Ambulatory Visit: Payer: Self-pay

## 2020-09-29 DIAGNOSIS — Z79899 Other long term (current) drug therapy: Secondary | ICD-10-CM

## 2020-09-30 LAB — BASIC METABOLIC PANEL
BUN/Creatinine Ratio: 17 (ref 12–28)
BUN: 19 mg/dL (ref 8–27)
CO2: 18 mmol/L — ABNORMAL LOW (ref 20–29)
Calcium: 9.1 mg/dL (ref 8.7–10.3)
Chloride: 104 mmol/L (ref 96–106)
Creatinine, Ser: 1.13 mg/dL — ABNORMAL HIGH (ref 0.57–1.00)
Glucose: 107 mg/dL — ABNORMAL HIGH (ref 65–99)
Potassium: 4.5 mmol/L (ref 3.5–5.2)
Sodium: 142 mmol/L (ref 134–144)
eGFR: 49 mL/min/{1.73_m2} — ABNORMAL LOW (ref >59)

## 2020-10-02 ENCOUNTER — Ambulatory Visit (INDEPENDENT_AMBULATORY_CARE_PROVIDER_SITE_OTHER): Payer: Medicare Other | Admitting: Family Medicine

## 2020-10-02 ENCOUNTER — Encounter: Payer: Self-pay | Admitting: Family Medicine

## 2020-10-02 ENCOUNTER — Other Ambulatory Visit: Payer: Self-pay

## 2020-10-02 VITALS — BP 114/74 | HR 63 | Temp 97.7°F | Resp 18 | Ht 63.0 in | Wt 150.2 lb

## 2020-10-02 DIAGNOSIS — G629 Polyneuropathy, unspecified: Secondary | ICD-10-CM | POA: Insufficient documentation

## 2020-10-02 DIAGNOSIS — E876 Hypokalemia: Secondary | ICD-10-CM | POA: Diagnosis not present

## 2020-10-02 DIAGNOSIS — N179 Acute kidney failure, unspecified: Secondary | ICD-10-CM | POA: Diagnosis not present

## 2020-10-02 DIAGNOSIS — R7989 Other specified abnormal findings of blood chemistry: Secondary | ICD-10-CM

## 2020-10-02 NOTE — Assessment & Plan Note (Signed)
Pt does not want medication at this time She would like to see neurology

## 2020-10-02 NOTE — Patient Instructions (Signed)
Bradley and Daroff's neurology in clinical practice (8th ed., pp. 1853- 1929). Elsevier."> Goldman-Cecil medicine (26th ed., pp. 2489- 2501). Elsevier.">  Peripheral Neuropathy Peripheral neuropathy is a type of nerve damage. It affects nerves that carry signals between the spinal cord and the arms, legs, and the rest of the body (peripheral nerves). It does not affect nerves in the spinal cord or brain. In peripheral neuropathy, one nerve or a group of nerves may be damaged. Peripheral neuropathy is a broad category that includes many specific nerve disorders, like diabetic neuropathy, hereditary neuropathy, and carpal tunnel syndrome. What are the causes? This condition may be caused by:  Diabetes. This is the most common cause of peripheral neuropathy.  Nerve injury.  Pressure or stress on a nerve that lasts a long time.  Lack (deficiency) of B vitamins. This can result from alcoholism, poor diet, or a restricted diet.  Infections.  Autoimmune diseases, such as rheumatoid arthritis and systemic lupus erythematosus.  Nerve diseases that are passed from parent to child (inherited).  Some medicines, such as cancer medicines (chemotherapy).  Poisonous (toxic) substances, such as lead and mercury.  Too little blood flowing to the legs.  Kidney disease.  Thyroid disease. In some cases, the cause of this condition is not known. What are the signs or symptoms? Symptoms of this condition depend on which of your nerves is damaged. Common symptoms include:  Loss of feeling (numbness) in the feet, hands, or both.  Tingling in the feet, hands, or both.  Burning pain.  Very sensitive skin.  Weakness.  Not being able to move a part of the body (paralysis).  Muscle twitching.  Clumsiness or poor coordination.  Loss of balance.  Not being able to control your bladder.  Feeling dizzy.  Sexual problems. How is this diagnosed? Diagnosing and finding the cause of peripheral  neuropathy can be difficult. Your health care provider will take your medical history and do a physical exam. A neurological exam will also be done. This involves checking things that are affected by your brain, spinal cord, and nerves (nervous system). For example, your health care provider will check your reflexes, how you move, and what you can feel. You may have other tests, such as:  Blood tests.  Electromyogram (EMG) and nerve conduction tests. These tests check nerve function and how well the nerves are controlling the muscles.  Imaging tests, such as CT scans or MRI to rule out other causes of your symptoms.  Removing a small piece of nerve to be examined in a lab (nerve biopsy).  Removing and examining a small amount of the fluid that surrounds the brain and spinal cord (lumbar puncture). How is this treated? Treatment for this condition may involve:  Treating the underlying cause of the neuropathy, such as diabetes, kidney disease, or vitamin deficiencies.  Stopping medicines that can cause neuropathy, such as chemotherapy.  Medicine to help relieve pain. Medicines may include: ? Prescription or over-the-counter pain medicine. ? Antiseizure medicine. ? Antidepressants. ? Pain-relieving patches that are applied to painful areas of skin.  Surgery to relieve pressure on a nerve or to destroy a nerve that is causing pain.  Physical therapy to help improve movement and balance.  Devices to help you move around (assistive devices). Follow these instructions at home: Medicines  Take over-the-counter and prescription medicines only as told by your health care provider. Do not take any other medicines without first asking your health care provider.  Do not drive or use heavy   machinery while taking prescription pain medicine. Lifestyle  Do not use any products that contain nicotine or tobacco, such as cigarettes and e-cigarettes. Smoking keeps blood from reaching damaged nerves.  If you need help quitting, ask your health care provider.  Avoid or limit alcohol. Too much alcohol can cause a vitamin B deficiency, and vitamin B is needed for healthy nerves.  Eat a healthy diet. This includes: ? Eating foods that are high in fiber, such as fresh fruits and vegetables, whole grains, and beans. ? Limiting foods that are high in fat and processed sugars, such as fried or sweet foods.   General instructions  If you have diabetes, work closely with your health care provider to keep your blood sugar under control.  If you have numbness in your feet: ? Check every day for signs of injury or infection. Watch for redness, warmth, and swelling. ? Wear padded socks and comfortable shoes. These help protect your feet.  Develop a good support system. Living with peripheral neuropathy can be stressful. Consider talking with a mental health specialist or joining a support group.  Use assistive devices and attend physical therapy as told by your health care provider. This may include using a walker or a cane.  Keep all follow-up visits as told by your health care provider. This is important.   Contact a health care provider if:  You have new signs or symptoms of peripheral neuropathy.  You are struggling emotionally from dealing with peripheral neuropathy.  Your pain is not well-controlled. Get help right away if:  You have an injury or infection that is not healing normally.  You develop new weakness in an arm or leg.  You have fallen or do so frequently. Summary  Peripheral neuropathy is when the nerves in the arms, or legs are damaged, resulting in numbness, weakness, or pain.  There are many causes of peripheral neuropathy, including diabetes, pinched nerves, vitamin deficiencies, autoimmune disease, and hereditary conditions.  Diagnosing and finding the cause of peripheral neuropathy can be difficult. Your health care provider will take your medical history, do a  physical exam, and do tests, including blood tests and nerve function tests.  Treatment involves treating the underlying cause of the neuropathy and taking medicines to help control pain. Physical therapy and assistive devices may also help. This information is not intended to replace advice given to you by your health care provider. Make sure you discuss any questions you have with your health care provider. Document Revised: 03/28/2020 Document Reviewed: 03/28/2020 Elsevier Patient Education  2021 Elsevier Inc.  

## 2020-10-02 NOTE — Progress Notes (Signed)
Patient ID: [name], female    DOB: 01-Dec-1940  Age: 80 y.o. MRN: [id number]    Subjective:  Subjective  HPI [name] presents for an office visit today. She complains of pain and swelling in her bilateral LE. She states the pains only affect her in the morning. She describes the pain as shooting and had resulted in numbness in her bilateral LE. She also complains of color change on her bilateral foot. She notes that the discoloration had worsen over time. She denies any chest pain, SOB, fever, abdominal pain, cough, chills, sore throat, dysuria, urinary incontinence, back pain, HA, or N/V/D at this time.   Review of Systems  Constitutional: Negative for chills, fatigue and fever.  HENT: Negative for congestion, sinus pressure, sinus pain and sore throat.   Eyes: Negative for pain.  Respiratory: Negative for cough and shortness of breath.   Cardiovascular: Positive for leg swelling (Bilateral). Negative for chest pain and palpitations.  Gastrointestinal: Negative for abdominal pain, blood in stool, constipation, diarrhea, nausea and vomiting.  Genitourinary: Negative for dysuria, frequency, hematuria and urgency.  Musculoskeletal: Negative for back pain.       (+) bilateral LE pain   Skin: Positive for color change (Bilateral foot).  Neurological: Positive for numbness (Bilateral LE ). Negative for weakness and headaches.    History Past Medical History:  Diagnosis Date  . Atrioventricular block, complete ([place])    a. 08/2011 Upgrade of PPM to MDT Adapta L Dual Chamber PPM ser # [id number] H.  . Cardiac pacemaker in situ   . Contact dermatitis and eczema    unspec cause  . Dairy product intolerance   . Depression   . Diastolic dysfunction   . GERD (gastroesophageal reflux disease)   . Hiatal hernia   . History of diverticulitis of colon    2003  perforated diverticulitis w/ surgical intervention  . History of kidney stones    age 59  . History of peripheral edema     lower extremities  . History of thyroid nodule    multinodular goiter  s/p  total thyroidectomy 2014  . HTN (hypertension)   . Hydronephrosis, left   . Hypothyroidism   . Mild sleep apnea    per study 04-14-2014  . Nocturia   . NSVT (nonsustained ventricular tachycardia) ([place])   . Osteoporosis   . PAF (paroxysmal atrial fibrillation) ([place])   . PONV (postoperative nausea and vomiting)   . Pulmonary nodules    stable per last ct  . Renal cell carcinoma of left kidney ([facility])   . Superficial thrombophlebitis     She has a past surgical history that includes Thyroidectomy (N/A, 08/07/2012); Colonoscopy (01/19/2013); pacemaker generator change (Left, 09/16/2011); pacemaker placement (05-11-2003  ); EXPLORATORY LAPARTOMY SIGMOID COLECTOMY/ COLOSTOMY (04-28-2002); Colostomy takedown (07-22-2002); LAPAROSCOPY VENTRAL HERNIA REPAIR/  EXTENSIVE LYSIS ADHESIONS (05-04-2004); Cardiovascular stress test (04-20-2015  dr Caryl Comes); transthoracic echocardiogram (10-12-2014); Cystoscopy with retrograde pyelogram, ureteroscopy and stent placement (Left, 07/28/2015); Robot assisted laparoscopic nephrectomy (Left, 03/20/2016); and Kyphoplasty.   Her family history includes Cancer in her son; Diverticulosis in her sister; Heart disease in her brother and mother; Heart failure in her father; Hypertension in her brother, brother, and mother; Stroke in her mother.She reports that she quit smoking about 41 years ago. Her smoking use included cigarettes. She quit after 15.00 years of use. She has never used smokeless tobacco. She reports that she does not drink alcohol and does not use drugs.  Current Outpatient Medications on  File Prior to Visit  Medication Sig Dispense Refill  . acetaminophen (TYLENOL) 500 MG tablet Take 1,000 mg by mouth daily as needed for moderate pain.    [name] amLODipine (NORVASC) 5 MG tablet TAKE 1 TABLET (5 MG TOTAL) BY MOUTH DAILY. 90 tablet 1  . apixaban (ELIQUIS) 5 MG TABS tablet Take 1 tablet (5 mg  total) by mouth 2 (two) times daily. 180 tablet 1  . COVID-19 mRNA vaccine, Pfizer, 30 MCG/0.3ML injection INJECT AS DIRECTED .3 mL 0  . denosumab (PROLIA) 60 MG/ML SOSY injection Inject 60 mg into the skin every 6 (six) months.    . diclofenac Sodium (VOLTAREN) 1 % GEL APPLY 4 GRAMS TOPICALLY 4 (FOUR) TIMES DAILY. (Patient taking differently: Apply 4 g topically as needed.) 100 g 2  . diphenhydramine-acetaminophen (TYLENOL PM) 25-500 MG TABS tablet Take 2 tablets by mouth at bedtime as needed (sleep/pain).    . fluticasone (FLONASE) 50 MCG/ACT nasal spray PLACE 2 SPRAYS INTO BOTH NOSTRILS DAILY AS NEEDED FOR ALLERGIES OR RHINITIS. 16 g 2  . levocetirizine (XYZAL) 5 MG tablet TAKE 1 TABLET (5 MG TOTAL) BY MOUTH EVERY EVENING. 90 tablet 3  . levothyroxine (SYNTHROID) 100 MCG tablet TAKE 1 TABLET BY MOUTH ONCE DAILY BEFORE BREAKFAST . 90 tablet 3  . lisinopril (ZESTRIL) 40 MG tablet TAKE 1 TABLET BY MOUTH ONCE DAILY 90 tablet 1  . loratadine (CLARITIN) 10 MG tablet TAKE 1 TABLET (10 MG TOTAL) BY MOUTH DAILY AS NEEDED FOR ALLERGIES. 100 tablet 2  . metoprolol tartrate (LOPRESSOR) 25 MG tablet Take 1 tablet (25 mg total) by mouth 2 (two) times daily. 60 tablet 2  . pantoprazole (PROTONIX) 40 MG tablet Take 1 tablet (40 mg total) by mouth 2 (two) times daily before a meal. 60 tablet 2  . [DISCONTINUED] hydrochlorothiazide (HYDRODIURIL) 12.5 MG tablet TAKE 1 TABLET BY MOUTH ONCE DAILY (Patient taking differently: Take 12.5 mg by mouth daily.) 90 tablet 1   Current Facility-Administered Medications on File Prior to Visit  Medication Dose Route Frequency Provider Last Rate Last Admin  . aminophylline injection 150 mg  150 mg Intravenous BID PRN Dorothy Spark, MD   150 mg at 04/20/15 1200     Objective:  Objective  Physical Exam Vitals and nursing note reviewed.  Constitutional:      General: She is not in acute distress.    Appearance: Normal appearance. She is well-developed. She is not  ill-appearing.  HENT:     Head: Normocephalic and atraumatic.     Right Ear: External ear normal.     Left Ear: External ear normal.     Nose: Nose normal.  Eyes:     Extraocular Movements: Extraocular movements intact.     Pupils: Pupils are equal, round, and reactive to light.  Cardiovascular:     Rate and Rhythm: Normal rate and regular rhythm.     Pulses: Normal pulses.     Heart sounds: Normal heart sounds. No murmur heard. No friction rub. No gallop.   Pulmonary:     Effort: Pulmonary effort is normal. No respiratory distress.     Breath sounds: Normal breath sounds. No wheezing, rhonchi or rales.  Abdominal:     General: Bowel sounds are normal. There is no distension.     Palpations: Abdomen is soft. There is no mass.     Tenderness: There is no abdominal tenderness. There is no guarding.     Hernia: No hernia is present.  Musculoskeletal:  Cervical back: Normal range of motion and neck supple.     Comments: There are no swelling and tenderness present on her bilateral LE. There are varicose veins present on her bilateral LE.  Skin:    General: Skin is warm and dry.  Neurological:     Mental Status: She is alert and oriented to person, place, and time.  Psychiatric:        Behavior: Behavior normal.        Thought Content: Thought content normal.    BP 114/74 (BP Location: Right Arm, Patient Position: Sitting, Cuff Size: Normal)   Pulse 63   Temp 97.7 F (36.5 C) (Oral)   Resp 18   Ht 5\' 3"  (1.6 m)   Wt 150 lb 3.2 oz (68.1 kg)   SpO2 95%   BMI 26.61 kg/m  Wt Readings from Last 3 Encounters:  10/02/20 150 lb 3.2 oz (68.1 kg)  09/21/20 146 lb (66.2 kg)  09/13/20 145 lb 15.1 oz (66.2 kg)     Lab Results  Component Value Date   WBC 4.5 09/16/2020   HGB 11.2 (L) 09/16/2020   HCT 33.4 (L) 09/16/2020   PLT 129 (L) 09/16/2020   GLUCOSE 107 (H) 09/29/2020   CHOL 175 07/12/2019   TRIG 98.0 07/12/2019   HDL 49.90 07/12/2019   LDLCALC 106 (H) 07/12/2019    ALT 15 07/12/2019   AST 24 07/12/2019   NA 142 09/29/2020   K 4.5 09/29/2020   CL 104 09/29/2020   CREATININE 1.13 (H) 09/29/2020   BUN 19 09/29/2020   CO2 18 (L) 09/29/2020   TSH 3.04 07/12/2019   INR 1.01 11/04/2013   HGBA1C 5.5 06/07/2016    DG Chest 2 View  Result Date: 09/13/2020 CLINICAL DATA:  Chest pain. Additional history provided: Chest pain and left arm pain since Sunday, worsening last night, dizziness when standing. EXAM: CHEST - 2 VIEW COMPARISON:  CT chest 03/16/2018.  Chest radiographs 07/30/2016. FINDINGS: Redemonstrated left chest dual lead pacer device. Heart size within normal limits. Aortic atherosclerosis. No appreciable airspace consolidation. No evidence of pleural effusion or pneumothorax. No acute bony abnormality is identified. Surgical clips within the lower neck. Multiple previously augmented vertebral compression fractures within the thoracic and lumbar spine. IMPRESSION: No evidence of acute cardiopulmonary abnormality. Aortic Atherosclerosis (ICD10-I70.0). Electronically Signed   By: Kyle  Golden DO   On: 09/13/2020 12:48   ECHOCARDIOGRAM COMPLETE  Result Date: 09/14/2020    ECHOCARDIOGRAM REPORT   Patient Name:   Riann M Kawano Date of Exam: 09/14/2020 Medical Rec #:  [id number]       Height:       63.0 in Accession #:    [id number]      Weight:       145.9 lb Date of Birth:  05/05/1941        BSA:          1.691 m Patient Age:    80 years        BP:           126/53 mmHg Patient Gender: F               HR:           63  bpm. Exam Location:  Inpatient Procedure: 2D Echo, Cardiac Doppler and Color Doppler Indications:     R07.9* Chest pain, unspecified  History:         Patient has prior history of Echocardiogram examinations, most  recent 10/12/2014.  Sonographer:     Merrie Roof RDCS Referring Phys:  [id number] Cleaster Corin PATEL Diagnosing Phys: Mertie Moores MD IMPRESSIONS  1. Left ventricular ejection fraction, by estimation, is 55 to 60%. The left  ventricle has normal function. The left ventricle has no regional wall motion abnormalities. There is mild asymmetric left ventricular hypertrophy. Left ventricular diastolic parameters are consistent with Grade II diastolic dysfunction (pseudonormalization).  2. Right ventricular systolic function is normal. The right ventricular size is normal. There is normal pulmonary artery systolic pressure.  3. The mitral valve is grossly normal. Mild mitral valve regurgitation.  4. Tricuspid valve regurgitation is mild to moderate.  5. The aortic valve is tricuspid. Aortic valve regurgitation is not visualized. FINDINGS  Left Ventricle: Left ventricular ejection fraction, by estimation, is 55 to 60%. The left ventricle has normal function. The left ventricle has no regional wall motion abnormalities. The left ventricular internal cavity size was normal in size. There is  mild asymmetric left ventricular hypertrophy. Left ventricular diastolic parameters are consistent with Grade II diastolic dysfunction (pseudonormalization). Right Ventricle: The right ventricular size is normal. No increase in right ventricular wall thickness. Right ventricular systolic function is normal. There is normal pulmonary artery systolic pressure. The tricuspid regurgitant velocity is 2.65 m/s, and  with an assumed right atrial pressure of 3 mmHg, the estimated right ventricular systolic pressure is 32.9 mmHg. Left Atrium: Left atrial size was normal in size. Right Atrium: Right atrial size was normal in size. Pericardium: There is no evidence of pericardial effusion. Mitral Valve: The mitral valve is grossly normal. Mild mitral valve regurgitation. Tricuspid Valve: The tricuspid valve is grossly normal. Tricuspid valve regurgitation is mild to moderate. Aortic Valve: The aortic valve is tricuspid. Aortic valve regurgitation is not visualized. Aortic valve mean gradient measures 3.0 mmHg. Aortic valve peak gradient measures 6.5 mmHg. Aortic valve  area, by VTI measures 2.65 cm. Pulmonic Valve: The pulmonic valve was normal in structure. Pulmonic valve regurgitation is not visualized. Aorta: The aortic root and ascending aorta are structurally normal, with no evidence of dilitation. IAS/Shunts: The atrial septum is grossly normal. Additional Comments: A device lead is visualized.  LEFT VENTRICLE PLAX 2D LVIDd:         4.10 cm  Diastology LVIDs:         2.80 cm  LV e' medial:    6.85 cm/s LV PW:         0.80 cm  LV E/e' medial:  12.0 LV IVS:        1.20 cm  LV e' lateral:   10.20 cm/s LVOT diam:     1.80 cm  LV E/e' lateral: 8.1 LV SV:         73 LV SV Index:   43 LVOT Area:     2.54 cm  RIGHT VENTRICLE          IVC RV Basal diam:  3.20 cm  IVC diam: 1.70 cm LEFT ATRIUM             Index       RIGHT ATRIUM           Index LA diam:        3.60 cm 2.13 cm/m  RA Area:     15.10 cm LA Vol (A2C):   53.5 ml 31.63 ml/m RA Volume:   42.90 ml  25.37 ml/m LA Vol (A4C):   26.3 ml 15.55 ml/m LA Biplane Vol: 38.6 ml 22.82 ml/m  AORTIC VALVE AV Area (Vmax):    2.46 cm AV Area (Vmean):   2.77 cm AV Area (VTI):     2.65 cm AV Vmax:           127.00 cm/s AV Vmean:          86.200 cm/s AV VTI:            0.275 m AV Peak Grad:      6.5 mmHg AV Mean Grad:      3.0 mmHg LVOT Vmax:         123.00 cm/s LVOT Vmean:        94.000 cm/s LVOT VTI:          0.286 m LVOT/AV VTI ratio: 1.04  AORTA Ao Root diam: 2.90 cm Ao Asc diam:  3.80 cm MITRAL VALVE               TRICUSPID VALVE MV Area (PHT): 4.60 cm    TR Peak grad:   28.1 mmHg MV Decel Time: 165 msec    TR Vmax:        265.00 cm/s MV E velocity: 82.30 cm/s MV A velocity: 90.40 cm/s  SHUNTS MV E/A ratio:  0.91        Systemic VTI:  0.29 m                            Systemic Diam: 1.80 cm Mertie Moores MD Electronically signed by Mertie Moores MD Signature Date/Time: 09/14/2020/12:36:32 PM    Final (Updated)      Assessment & Plan:  Plan   No orders of the defined types were placed in this encounter.   Problem List  Items Addressed This Visit   None   Visit Diagnoses    Neuropathy    -  Primary   Relevant Orders   Ambulatory referral to Neurology   Hypokalemia       Relevant Orders   Basic metabolic panel      Follow-up: Return in about 2 weeks (around 10/16/2020), or lab app --- beginning of the week.   I,Gordon Zheng,acting as a Education administrator for [employer], DO.,have documented all relevant documentation on the behalf of Daisy Held, DO,as directed by  Daisy Held, DO while in the presence of [place], DO, have reviewed all documentation for this visit. The documentation on 10/02/20 for the exam, diagnosis, procedures, and orders are all accurate and complete.  Daisy Held, DO

## 2020-10-02 NOTE — Assessment & Plan Note (Signed)
Chemistry      Component Value Date/Time   NA 142 09/29/2020 1048   K 4.5 09/29/2020 1048   CL 104 09/29/2020 1048   CO2 18 (L) 09/29/2020 1048   BUN 19 09/29/2020 1048   CREATININE 1.13 (H) 09/29/2020 1048   CREATININE 1.08 (H) 01/18/2019 1107      Component Value Date/Time   CALCIUM 9.1 09/29/2020 1048   ALKPHOS 48 07/12/2019 1507   AST 24 07/12/2019 1507   ALT 15 07/12/2019 1507   BILITOT 0.6 07/12/2019 1507     Will recheck in 2 weeks --- pt will work on drinking more liquids

## 2020-10-02 NOTE — Assessment & Plan Note (Signed)
Chemistry      Component Value Date/Time   NA 142 09/29/2020 1048   K 4.5 09/29/2020 1048   CL 104 09/29/2020 1048   CO2 18 (L) 09/29/2020 1048   BUN 19 09/29/2020 1048   CREATININE 1.13 (H) 09/29/2020 1048   CREATININE 1.08 (H) 01/18/2019 1107      Component Value Date/Time   CALCIUM 9.1 09/29/2020 1048   ALKPHOS 48 07/12/2019 1507   AST 24 07/12/2019 1507   ALT 15 07/12/2019 1507   BILITOT 0.6 07/12/2019 1507

## 2020-10-04 ENCOUNTER — Encounter: Payer: Self-pay | Admitting: Neurology

## 2020-10-11 ENCOUNTER — Other Ambulatory Visit (HOSPITAL_BASED_OUTPATIENT_CLINIC_OR_DEPARTMENT_OTHER): Payer: Self-pay

## 2020-10-11 MED FILL — Diclofenac Sodium Gel 1% (1.16% Diethylamine Equiv): CUTANEOUS | 25 days supply | Qty: 100 | Fill #0 | Status: AC

## 2020-10-11 MED FILL — Lisinopril Tab 40 MG: ORAL | 90 days supply | Qty: 90 | Fill #0 | Status: AC

## 2020-10-11 MED FILL — Loratadine Tab 10 MG: ORAL | Qty: 100 | Fill #0 | Status: CN

## 2020-10-12 DIAGNOSIS — M7989 Other specified soft tissue disorders: Secondary | ICD-10-CM

## 2020-10-13 ENCOUNTER — Other Ambulatory Visit (HOSPITAL_BASED_OUTPATIENT_CLINIC_OR_DEPARTMENT_OTHER): Payer: Self-pay

## 2020-10-16 ENCOUNTER — Other Ambulatory Visit: Payer: Self-pay

## 2020-10-16 ENCOUNTER — Other Ambulatory Visit (INDEPENDENT_AMBULATORY_CARE_PROVIDER_SITE_OTHER): Payer: Medicare Other

## 2020-10-16 ENCOUNTER — Other Ambulatory Visit (HOSPITAL_BASED_OUTPATIENT_CLINIC_OR_DEPARTMENT_OTHER): Payer: Self-pay

## 2020-10-16 DIAGNOSIS — E876 Hypokalemia: Secondary | ICD-10-CM | POA: Diagnosis not present

## 2020-10-16 MED FILL — Loratadine Tab 10 MG: ORAL | 100 days supply | Qty: 100 | Fill #0 | Status: AC

## 2020-10-17 LAB — BASIC METABOLIC PANEL
BUN: 25 mg/dL — ABNORMAL HIGH (ref 6–23)
CO2: 29 mEq/L (ref 19–32)
Calcium: 9.4 mg/dL (ref 8.4–10.5)
Chloride: 101 mEq/L (ref 96–112)
Creatinine, Ser: 1.03 mg/dL (ref 0.40–1.20)
GFR: 50.74 mL/min — ABNORMAL LOW (ref >60.00)
Glucose, Bld: 88 mg/dL (ref 70–99)
Potassium: 4.8 mEq/L (ref 3.5–5.1)
Sodium: 136 mEq/L (ref 135–145)

## 2020-10-23 ENCOUNTER — Encounter: Payer: Self-pay | Admitting: Family Medicine

## 2020-10-23 ENCOUNTER — Other Ambulatory Visit: Payer: Self-pay

## 2020-10-23 MED ORDER — PANTOPRAZOLE SODIUM 40 MG PO TBEC: 40.0000 mg | DELAYED_RELEASE_TABLET | Freq: Two times a day (BID) | ORAL | 2 refills | Status: DC

## 2020-10-23 MED ORDER — METOPROLOL TARTRATE 25 MG PO TABS: 25.0000 mg | ORAL_TABLET | Freq: Two times a day (BID) | ORAL | 2 refills | Status: DC

## 2020-11-08 ENCOUNTER — Ambulatory Visit (INDEPENDENT_AMBULATORY_CARE_PROVIDER_SITE_OTHER): Payer: Medicare Other

## 2020-11-08 DIAGNOSIS — I442 Atrioventricular block, complete: Secondary | ICD-10-CM

## 2020-11-08 DIAGNOSIS — I480 Paroxysmal atrial fibrillation: Secondary | ICD-10-CM

## 2020-11-09 LAB — CUP PACEART REMOTE DEVICE CHECK
Battery Impedance: 1998 Ohm
Battery Remaining Longevity: 32 mo
Battery Voltage: 2.73 V
Brady Statistic AP VP Percent: 19 %
Brady Statistic AP VS Percent: 0 %
Brady Statistic AS VP Percent: 81 %
Brady Statistic AS VS Percent: 0 %
Date Time Interrogation Session: 20220512105104
Implantable Lead Implant Date: 20041110
Implantable Lead Implant Date: 20041110
Implantable Lead Location: 753859
Implantable Lead Location: 753860
Implantable Pulse Generator Implant Date: 20130318
Lead Channel Impedance Value: 396 Ohm
Lead Channel Impedance Value: 511 Ohm
Lead Channel Pacing Threshold Amplitude: 0.5 V
Lead Channel Pacing Threshold Amplitude: 0.625 V
Lead Channel Pacing Threshold Pulse Width: 0.4 ms
Lead Channel Pacing Threshold Pulse Width: 0.4 ms
Lead Channel Setting Pacing Amplitude: 2 V
Lead Channel Setting Pacing Amplitude: 2.5 V
Lead Channel Setting Pacing Pulse Width: 0.4 ms
Lead Channel Setting Sensing Sensitivity: 4 mV

## 2020-11-14 ENCOUNTER — Encounter: Payer: Self-pay | Admitting: Internal Medicine

## 2020-11-14 ENCOUNTER — Ambulatory Visit (INDEPENDENT_AMBULATORY_CARE_PROVIDER_SITE_OTHER): Payer: Medicare Other | Admitting: Internal Medicine

## 2020-11-14 ENCOUNTER — Other Ambulatory Visit: Payer: Self-pay

## 2020-11-14 VITALS — BP 140/82 | HR 67 | Ht 63.0 in | Wt 151.8 lb

## 2020-11-14 DIAGNOSIS — E559 Vitamin D deficiency, unspecified: Secondary | ICD-10-CM | POA: Diagnosis not present

## 2020-11-14 DIAGNOSIS — M810 Age-related osteoporosis without current pathological fracture: Secondary | ICD-10-CM | POA: Diagnosis not present

## 2020-11-14 DIAGNOSIS — E890 Postprocedural hypothyroidism: Secondary | ICD-10-CM

## 2020-11-14 NOTE — Progress Notes (Addendum)
Patient ID: [name], female   DOB: [birth year], 80 y.o.   MRN: [id number]   This visit occurred during the SARS-CoV-2 public health emergency.  Safety protocols were in place, including screening questions prior to the visit, additional usage of staff PPE, and extensive cleaning of exam room while observing appropriate contact time as indicated for disinfecting solutions.   HPI  [name] is a 80 y.o.-year-old female, referred by her PCP, Dr. Etter Sjogren, presenting for follow-up for osteoporosis.  Last visit 1 year ago.  Interim history: No falls or fractures since last visit. No dizziness or orthostasis.  Reviewed and addended history: Pt was dx with OP in in 1999-2000, in [place].  Reviewed her DXA scan reports: Date L1-L4 T score FN T score 33% distal Radius Ultra distal radius  12/02/2019 ([facility]) N/a LFN: -2.8 RFN: -2.7  (+9.4%*) -3.0 (+2.2%) -4.0  05/29/2017 ([facility]) N/a (multiple compression fractures) LFN: -3.1 (+0.5%) RFN: -2.9 -3.2 (-1.0%) -4.6  05/29/2015 ([facility]) L1, L3: -2.7 LFN: -3.1 -3.1 n/a  06/04/2012 L1-L4: -3.4 LFN: -2.2 RFN: -2.2 n/a n/a   Fractures: 7 vertebral fractures with 6 kyphoplasties: Reviewed CT Lumbar spine report from 2016: Remote compression fractures with cement augmentation T11, T12, L2, L4 and L5. Tiny amount of cement seen adjacent to the right L4 and L5 nerve roots. Subacute superior endplate compression deformity L1 and L3.  Osteoporosis treatments: - Fosamax  - 2 years >> ineffective - Forteo - 2 years  - 2003 - Prolia - started 2012 (however, only 1 documented inj/year in 2012, 2014, 2015 and no doses documented in 2017):  10/09/2017 05/14/2018 11/19/2018 06/01/2019 12/15/2019 06/15/2020  She has a history of vitamin D insufficiency.  Reviewed available vit D levels: Lab Results  Component Value Date   VD25OH 52.5 11/16/2019   VD25OH 37.32 01/18/2019   VD25OH 29.82 (L) 10/06/2017    VD25OH 32 07/04/2009   VD25OH 24 (L) 02/14/2009   At last visit, patient was on calcium and vitamin D 2000 units + 200 units daily from calcium plus D. Now, she believes takes a different dose >> will look at home and let me know.  She does does weightbearing exercises: stretch bands; and also balance exercises, walking on treadmill.  She does not take high vitamin A doses.  No steroid injections.  She had early menopause, at 30 to 80 years old.  No HRT.   Pt does have a FH of osteoporosis: Mother- fx'ed ankle, foot.  No history of kidney stones, hyper or hypocalcemia or hyperparathyroidism: Lab Results  Component Value Date   PTH 25 10/06/2017   PTH Comment 10/06/2017   CALCIUM 9.4 10/16/2020   CALCIUM 9.1 09/29/2020   CALCIUM 9.6 09/21/2020   CALCIUM 8.6 (L) 09/17/2020   CALCIUM 8.4 (L) 09/16/2020   CALCIUM 8.8 (L) 09/15/2020   CALCIUM 9.3 09/14/2020   CALCIUM 9.9 09/13/2020   CALCIUM 9.9 11/16/2019   CALCIUM 10.3 07/12/2019   No history of thyrotoxicosis. She has a h/o thyroid nodules >> now s/p total thyroidectomy, on LT4 100 mcg.  Latest TSH was normal: Lab Results  Component Value Date   TSH 3.04 07/12/2019   TSH 2.92 05/14/2018   TSH 5.40 (H) 03/05/2018   TSH 1.14 06/05/2017   TSH 1.770 08/13/2016   Pt is on levothyroxine 100 mcg daily, taken: - in am - fasting - at least 1h from b'fast - + calcium 1h later  -  started 2 mo ago - no iron - no multivitamins - + PPIs in am 30 min before b'fast - not on Biotin  No history of CKD. She had only 1 kidney. Last BUN/Cr: Lab Results  Component Value Date   BUN 25 (H) 10/16/2020   CREATININE 1.03 10/16/2020   She also has HTN.  ROS: Constitutional: no weight gain/no weight loss, no fatigue, no subjective hyperthermia, no subjective hypothermia Eyes: no blurry vision, no xerophthalmia ENT: no sore throat, no nodules palpated in neck, no dysphagia, no odynophagia, no hoarseness Cardiovascular: no CP/no SOB/no  palpitations/+ leg swelling (wears compression hoses) Respiratory: no cough/no SOB/no wheezing Gastrointestinal: no N/no V/no D/no C/no acid reflux Musculoskeletal: no muscle aches/no joint aches Skin: no rashes, no hair loss Neurological: no tremors/no numbness/no tingling/no dizziness  I reviewed pt's medications, allergies, PMH, social hx, family hx, and changes were documented in the history of present illness. Otherwise, unchanged from my initial visit note.  Past Medical History:  Diagnosis Date  . Atrioventricular block, complete ([place])    a. 08/2011 Upgrade of PPM to MDT Adapta L Dual Chamber PPM ser # [id number].  . Cardiac pacemaker in situ   . Contact dermatitis and eczema    unspec cause  . Dairy product intolerance   . Depression   . Diastolic dysfunction   . GERD (gastroesophageal reflux disease)   . Hiatal hernia   . History of diverticulitis of colon    2003  perforated diverticulitis w/ surgical intervention  . History of kidney stones    age 16  . History of peripheral edema    lower extremities  . History of thyroid nodule    multinodular goiter  s/p  total thyroidectomy 2014  . HTN (hypertension)   . Hydronephrosis, left   . Hypothyroidism   . Mild sleep apnea    per study 04-14-2014  . Nocturia   . NSVT (nonsustained ventricular tachycardia) ([place])   . Osteoporosis   . PAF (paroxysmal atrial fibrillation) ([place])   . PONV (postoperative nausea and vomiting)   . Pulmonary nodules    stable per last ct  . Renal cell carcinoma of left kidney ([facility])   . Superficial thrombophlebitis    Past Surgical History:  Procedure Laterality Date  . CARDIOVASCULAR STRESS TEST  04-20-2015  dr Caryl Comes   normal nuclear study/  no ischemia/  normal LV function and wall motion, ef 74%  . COLONOSCOPY  01/19/2013  . COLOSTOMY TAKEDOWN  07-22-2002  . CYSTOSCOPY WITH RETROGRADE PYELOGRAM, URETEROSCOPY AND STENT PLACEMENT Left 07/28/2015   Procedure: CYSTOSCOPY WITH LEFT  RETROGRADE PYELOGRAM, POSSIBLE LEFT URETEROSCOPY AND STENT PLACEMENT;  Surgeon: Franchot Gallo, MD;  Location: [facility];  Service: Urology;  Laterality: Left;  . EXPLORATORY LAPARTOMY SIGMOID COLECTOMY/ COLOSTOMY  04-28-2002   perforated diverticulitis  . KYPHOPLASTY    . LAPAROSCOPY VENTRAL HERNIA REPAIR/  EXTENSIVE LYSIS ADHESIONS  05-04-2004  . PACEMAKER GENERATOR CHANGE Left 09/16/2011   MDT [employer] [place] pacemaker  . PACEMAKER PLACEMENT  05-11-2003     medtronic  . ROBOT ASSISTED LAPAROSCOPIC NEPHRECTOMY Left 03/20/2016   Procedure: XI ROBOTIC ASSISTED LAPAROSCOPIC RETROPERITONEAL NEPHRECTOMY;  Surgeon: Alexis Frock, MD;  Location: [facility];  Service: Urology;  Laterality: Left;  . THYROIDECTOMY N/A 08/07/2012   Procedure: THYROIDECTOMY;  Surgeon: Earnstine Regal, MD;  Location: [facility];  Service: General;  Laterality: N/A;  . TRANSTHORACIC ECHOCARDIOGRAM  10-12-2014   moderate focal basal LVH,  ef 55-60%,  grade 2 diastolic  dysfunction, mild AV calcification without stenosis,  mild MR,  trivial PR   Social History   Socioeconomic History  . Marital status: Single    Spouse name: Not on file  . Number of children: 2  . Years of education: 74  . Highest education level: Not on file  Occupational History  . 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 Smoking status: Former Smoker    Years: 15.00    Types: Cigarettes    Quit date: 10/06/1979    Years since quitting: 41.1  . Smokeless tobacco: Never Used  Substance and Sexual Activity  . Alcohol use: No  . Drug use: No  . Sexual activity: Not on file  Other Topics Concern  . 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  She was married for 22 years, divorced, and has remained single. She has 1 grown son, 1 grown daughter.  She has 3 grandchildren, 2 of her  children   live in [place].  She is retired.  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   Social Determinants of Health   Financial Resource Strain: Not on file  Food Insecurity: Not on file  Transportation Needs: Not on file  Physical Activity: Not on file  Stress: Not on file  Social Connections: Not on file  Intimate Partner Violence: Not on file   Current Outpatient Medications on File Prior to Visit  Medication Sig Dispense Refill  . acetaminophen (TYLENOL) 500 MG tablet Take 1,000 mg by mouth daily as needed for moderate pain.    [name] amLODipine (NORVASC) 5 MG tablet TAKE 1 TABLET (5 MG TOTAL) BY MOUTH DAILY. 90 tablet 1  . apixaban (ELIQUIS) 5 MG TABS tablet Take 1 tablet (5 mg total) by mouth 2 (two) times daily. 180 tablet 1  . COVID-19 mRNA vaccine, Pfizer, 30 MCG/0.3ML injection INJECT AS DIRECTED .3 mL 0  . denosumab (PROLIA) 60 MG/ML SOSY injection Inject 60 mg into the skin every 6 (six) months.    . diclofenac Sodium (VOLTAREN) 1 % GEL APPLY 4 GRAMS TOPICALLY 4 (FOUR) TIMES DAILY. (Patient taking differently: Apply 4 g topically as needed.) 100 g 2  . diphenhydramine-acetaminophen (TYLENOL PM) 25-500 MG TABS tablet Take 2 tablets by mouth at bedtime as needed (sleep/pain).    . fluticasone (FLONASE) 50 MCG/ACT nasal spray PLACE 2 SPRAYS INTO BOTH NOSTRILS DAILY AS NEEDED FOR ALLERGIES OR RHINITIS. 16 g 2  . levocetirizine (XYZAL) 5 MG tablet TAKE 1 TABLET (5 MG TOTAL) BY MOUTH EVERY EVENING. 90 tablet 3  . levothyroxine (SYNTHROID) 100 MCG tablet TAKE 1 TABLET BY MOUTH ONCE DAILY BEFORE BREAKFAST . 90 tablet 3  . lisinopril (ZESTRIL) 40 MG tablet TAKE 1 TABLET BY MOUTH ONCE DAILY 90 tablet 1  . loratadine (CLARITIN) 10 MG tablet TAKE 1 TABLET (10 MG TOTAL) BY MOUTH DAILY AS NEEDED FOR ALLERGIES. 100 tablet 2  . metoprolol tartrate (LOPRESSOR) 25 MG tablet Take 1 tablet (25 mg total) by mouth 2  (two) times daily. 60 tablet 2  . pantoprazole (PROTONIX) 40 MG tablet Take 1 tablet (40 mg total) by mouth 2 (two) times daily before a meal. 60 tablet 2  . [DISCONTINUED] hydrochlorothiazide (  HYDRODIURIL) 12.5 MG tablet TAKE 1 TABLET BY MOUTH ONCE DAILY (Patient taking differently: Take 12.5 mg by mouth daily.) 90 tablet 1   Current Facility-Administered Medications on File Prior to Visit  Medication Dose Route Frequency Provider Last Rate Last Admin  . aminophylline injection 150 mg  150 mg Intravenous BID PRN Dorothy Spark, MD   150 mg at 04/20/15 1200   Allergies  Allergen Reactions  . Tape Rash  . Codeine Nausea And Vomiting   Family History  Problem Relation Age of Onset  . Heart disease Mother        Stent in her 65s  . Stroke Mother   . Hypertension Mother   . Heart failure Father   . Hypertension Brother   . Heart disease Brother   . Diverticulosis Sister   . Hypertension Brother   . Cancer Son        breast  . Heart attack Neg Hx     PE: BP 140/82 (BP Location: Right Arm, Patient Position: Sitting, Cuff Size: Normal)   Pulse 67   Ht '5\' 3"'  (1.6 m)   Wt 151 lb 12.8 oz (68.9 kg)   SpO2 99%   BMI 26.89 kg/m  Wt Readings from Last 3 Encounters:  11/14/20 151 lb 12.8 oz (68.9 kg)  10/02/20 150 lb 3.2 oz (68.1 kg)  09/21/20 146 lb (66.2 kg)   Constitutional: normal weight, in NAD Eyes: PERRLA, EOMI, no exophthalmos ENT: moist mucous membranes, no thyromegaly, no cervical lymphadenopathy Cardiovascular: RRR, No MRG Respiratory: CTA B Gastrointestinal: abdomen soft, NT, ND, BS+ Musculoskeletal: no deformities, strength intact in all 4 Skin: moist, warm, no rashes Neurological: no tremor with outstretched hands, DTR normal in all 4  Assessment: 1. Osteoporosis  2.  Vitamin D insufficiency  3.  Postsurgical hypothyroidism  Plan: 1. Osteoporosis -Likely postmenopausal (early menopause, no HRT), + age-related, + she also has family history of  osteoporosis -Review latest bone density scan reports, latest from 12/02/2019.  She had a significant increase in bone density at the level of the femoral necks. The scores were still low at the level of the ultra distal and 33% distal radius, but improved.  She remains at high risk for fracture. -In the past investigation was negative for excessive steroid production, hyperparathyroidism, severe vitamin D deficiency, RA, Cushing syndrome, systemic mastocytosis.  She did not have a history of cancer to suggest metastasis to the bone, no renal failure to indicate multiple myeloma, no history of low alkaline phosphatase, no previous fractures before her vertebral fx or blue sclerae to indicate osteogenesis imperfecta.  -She had several vertebral fractures with possible risk factors: Early menopause, missed Prolia injections. -We did discuss in the past about not missing Prolia injections are delayed by more than 1 month due to the increased risk of fracture and loss of BMD. -In toto, she was on Prolia for 10 years, however, she missed doses in the past.  We discussed that since she missed doses of Prolia in the past, we can probably consider her first dose in 2015 -We discussed about continuing Prolia for another year and then give her 1-2 doses of Reclast IV -Of note, she does have a history of Forteo for 2 years.  Therefore, we do not have many options for treatment we can use romosozumab if absolutely needed -She continues on vitamin D and calcium supplements, but she changed them since last visit.  She will look at home and tell me exactly how much  she is taking. -I we again discussed about fall precautions and I advised her to do balance and weightbearing exercises that she using bands and weight -I will see her back in 1 year  2.  Vitamin D insufficiency -She is currently taking vitamin D but she will clarify how much she is actually taking -Latest level was normal, at 52.5, in 10/2019 -We will  recheck the level in 1.5 mo when she returns for thyroid test (see below).  3.  Postsurgical hypothyroidism - latest thyroid labs reviewed with pt >> normal: Lab Results  Component Value Date   TSH 3.04 07/12/2019   - she continues on LT4 100 mcg daily - pt feels good on this dose. - we discussed about taking the thyroid hormone every day, with water, >30 minutes before breakfast, separated by >4 hours from acid reflux medications, calcium, iron, multivitamins. Pt. Is not taking it correctly >> now calcium and PPI early in am >> will move these later. - will check thyroid tests in 1.5 mo: TSH and fT4 - If labs are abnormal, she will need to return for repeat TFTs in 1.5 months  Orders Placed This Encounter  Procedures  . TSH  . T4, free  . VITAMIN D 25 Hydroxy (Vit-D Deficiency, Fractures)   Addendum: Patient contacted me after the visit that she is taking the following supplements Listed are amounts taken in each supplement I take daily  Vit D3...............5,000 i.u. Multi w/D3.......1,000 i.u. Cal + D3............1,000 i.u.  Not sure what I was taking in the past that amounted to the 1200 i.u. we discussed during visit but if i should be taking more  please advise. Thanks so much.  It appears that she is taking 7000 units of vitamin D daily.  For now, we will continue this dose, but I will let her know if we need to decrease the dose when the new labs return.  Philemon Kingdom, MD PhD [facility]

## 2020-11-14 NOTE — Patient Instructions (Addendum)
Please come back for labs in 5 weeks.  Continue Levothyroxine 100 mcg daily.  Take the thyroid hormone every day, with water, at least 30 minutes before breakfast, separated by at least 4 hours from: - acid reflux medications - calcium - iron - multivitamins  Move Protonix and calcium to lunchtime.  Please come back for a follow-up appointment in 1 year.

## 2020-11-17 ENCOUNTER — Encounter: Payer: Self-pay | Admitting: Internal Medicine

## 2020-11-19 ENCOUNTER — Telehealth: Payer: Self-pay

## 2020-11-19 NOTE — Telephone Encounter (Signed)
Prolia VOB initiated via [email or web address]  Last OV: 11/14/20 Next OV:  Last Prolia inj: 06/15/20 Next Prolia inj DUE: 12/15/20

## 2020-11-20 ENCOUNTER — Encounter: Payer: Self-pay | Admitting: Family Medicine

## 2020-11-20 MED ORDER — METOPROLOL TARTRATE 25 MG PO TABS: 25.0000 mg | ORAL_TABLET | Freq: Two times a day (BID) | ORAL | 1 refills | Status: DC

## 2020-11-30 NOTE — Progress Notes (Signed)
Remote pacemaker transmission.   

## 2020-12-07 NOTE — Telephone Encounter (Signed)
Pt ready for scheduling on or after 12/15/20  Out-of-pocket cost due at time of visit: $0.00  Primary: Medicare Prolia co-insurance: 20% ($255)  Admin fee co-insurance: 20% ($25)  Secondary: [employer] Prolia co-insurance: The secondary plan will consider the Medicare Part B deductible and co-insurance after patient satisfies a $300 deductible Admin fee co-insurance: The secondary plan will consider the Medicare Part B deductible and co-insurance after patient satisfies a $300 deductible  Deductible: a $300 deductible ($321met)  Prior Auth: n/a PA# Valid:

## 2020-12-08 NOTE — Progress Notes (Signed)
NEUROLOGY CONSULTATION NOTE  [name] MRN: [id number] DOB: 1940/09/04  Referring provider: Roma Schanz, DO Primary care provider: Roma Schanz, DO  Reason for consult:  neuropathy  Assessment/Plan:   Peripheral neuropathy Raynaud's phenomenon   Check labs:  ANA, sed rate, SSA/SSB antibodies, B12, TSH, ACE, SPEP/IFE  NCV-EMG left upper and lower extremities Start gabapentin 100mg  TID.  We can increase dose in 4 weeks if needed.   Subjective:  [name] is an 80 year old right-handed female with AV block s/p pacemaker, hypertension and hypothyroidism who presents for neuropathy.  History supplemented by referring provider's note.  CT lumbar spine from 05/11/2015 and X-ray of lumbar spine personally reviewed.  Patient has history of chronic low back pain and hip pain.  She has degenerative changes of the lumbar spine with spinal stenosis most prominent at L4-5 and has previously sustained compression fractures of the lumbar spine requiring cement augmentation at T11, T12, L2, L4 and L5 (as demonstrated on CT on 05/11/2015).  More recently, she had lumbar X-ray on 12/27/2019 which again demonstrated status post multilevel kyphoplasty and mild degenerative disc disease at L5-S1.  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   For years, she has had paresthesias, coldness and pain in the feet as well as cramping in the legs.  Left leg worse than right leg.  Sometimes her varicose veins are swollen and she wears compression stockings.  She had lower extremity venous study in January which showed venous reflux in the right CFV, GSV and left GSV in distal thigh but otherwise unremarkable with no evidence of thrombosis.  She also notes numbness and tingling in the hands.  Sometimes her finger tips turn bluish.     PAST MEDICAL HISTORY: Past Medical History:  Diagnosis Date   Atrioventricular block, complete ([place])    a. 08/2011 Upgrade of PPM to MDT Adapta L Dual Chamber PPM ser  # [id number].   Cardiac pacemaker in situ    Contact dermatitis and eczema    unspec cause   Dairy product intolerance    Depression    Diastolic dysfunction    GERD (gastroesophageal reflux disease)    Hiatal hernia    History of diverticulitis of colon    2003  perforated diverticulitis w/ surgical intervention   History of kidney stones    age 42   History of peripheral edema    lower extremities   History of thyroid nodule    multinodular goiter  s/p  total thyroidectomy 2014   HTN (hypertension)    Hydronephrosis, left    Hypothyroidism    Mild sleep apnea    per study 04-14-2014   Nocturia    NSVT (nonsustained ventricular tachycardia) ([facility])    Osteoporosis    PAF (paroxysmal atrial fibrillation) ([facility])    PONV (postoperative nausea and vomiting)    Pulmonary nodules    stable per last ct   Renal cell carcinoma of left kidney ([place])    Superficial thrombophlebitis     PAST SURGICAL HISTORY: Past Surgical History:  Procedure Laterality Date   CARDIOVASCULAR STRESS TEST  04-20-2015  dr Caryl Comes   normal nuclear study/  no ischemia/  normal LV function and wall motion, ef 74%   COLONOSCOPY  01/19/2013   COLOSTOMY TAKEDOWN  07-22-2002   CYSTOSCOPY WITH RETROGRADE PYELOGRAM, URETEROSCOPY AND STENT PLACEMENT Left 07/28/2015   Procedure: CYSTOSCOPY WITH LEFT RETROGRADE PYELOGRAM, POSSIBLE LEFT URETEROSCOPY AND STENT PLACEMENT;  Surgeon: Franchot Gallo, MD;  Location: [facility];  Service: Urology;  Laterality: Left;   EXPLORATORY LAPARTOMY SIGMOID COLECTOMY/ COLOSTOMY  04-28-2002   perforated diverticulitis   KYPHOPLASTY     LAPAROSCOPY VENTRAL HERNIA REPAIR/  EXTENSIVE LYSIS ADHESIONS  05-04-2004   PACEMAKER GENERATOR CHANGE Left 09/16/2011   MDT ADDRL1 pacemaker   PACEMAKER PLACEMENT  05-11-2003     medtronic   ROBOT ASSISTED LAPAROSCOPIC NEPHRECTOMY Left 03/20/2016   Procedure: XI ROBOTIC ASSISTED LAPAROSCOPIC RETROPERITONEAL NEPHRECTOMY;  Surgeon:  Alexis Frock, MD;  Location: [facility];  Service: Urology;  Laterality: Left;   THYROIDECTOMY N/A 08/07/2012   Procedure: THYROIDECTOMY;  Surgeon: Earnstine Regal, MD;  Location: [facility];  Service: General;  Laterality: N/A;   TRANSTHORACIC ECHOCARDIOGRAM  10-12-2014   moderate focal basal LVH,  ef 55-60%,  grade 2 diastolic dysfunction, mild AV calcification without stenosis,  mild MR,  trivial PR    MEDICATIONS: Current Outpatient Medications on File Prior to Visit  Medication Sig Dispense Refill   acetaminophen (TYLENOL) 500 MG tablet Take 1,000 mg by mouth daily as needed for moderate pain.     amLODipine (NORVASC) 5 MG tablet TAKE 1 TABLET (5 MG TOTAL) BY MOUTH DAILY. 90 tablet 1   apixaban (ELIQUIS) 5 MG TABS tablet Take 1 tablet (5 mg total) by mouth 2 (two) times daily. 180 tablet 1   COVID-19 mRNA vaccine, Pfizer, 30 MCG/0.3ML injection INJECT AS DIRECTED .3 mL 0   denosumab (PROLIA) 60 MG/ML SOSY injection Inject 60 mg into the skin every 6 (six) months.     diclofenac Sodium (VOLTAREN) 1 % GEL APPLY 4 GRAMS TOPICALLY 4 (FOUR) TIMES DAILY. (Patient taking differently: Apply 4 g topically as needed.) 100 g 2   diphenhydramine-acetaminophen (TYLENOL PM) 25-500 MG TABS tablet Take 2 tablets by mouth at bedtime as needed (sleep/pain).     fluticasone (FLONASE) 50 MCG/ACT nasal spray PLACE 2 SPRAYS INTO BOTH NOSTRILS DAILY AS NEEDED FOR ALLERGIES OR RHINITIS. 16 g 2   levocetirizine (XYZAL) 5 MG tablet TAKE 1 TABLET (5 MG TOTAL) BY MOUTH EVERY EVENING. 90 tablet 3   levothyroxine (SYNTHROID) 100 MCG tablet TAKE 1 TABLET BY MOUTH ONCE DAILY BEFORE BREAKFAST . 90 tablet 3   lisinopril (ZESTRIL) 40 MG tablet TAKE 1 TABLET BY MOUTH ONCE DAILY 90 tablet 1   loratadine (CLARITIN) 10 MG tablet TAKE 1 TABLET (10 MG TOTAL) BY MOUTH DAILY AS NEEDED FOR ALLERGIES. 100 tablet 2   metoprolol tartrate (LOPRESSOR) 25 MG tablet Take 1 tablet (25 mg total) by mouth 2 (two) times daily. 180 tablet 1    pantoprazole (PROTONIX) 40 MG tablet Take 1 tablet (40 mg total) by mouth 2 (two) times daily before a meal. 60 tablet 2   [DISCONTINUED] hydrochlorothiazide (HYDRODIURIL) 12.5 MG tablet TAKE 1 TABLET BY MOUTH ONCE DAILY (Patient taking differently: Take 12.5 mg by mouth daily.) 90 tablet 1   Current Facility-Administered Medications on File Prior to Visit  Medication Dose Route Frequency Provider Last Rate Last Admin   aminophylline injection 150 mg  150 mg Intravenous BID PRN Dorothy Spark, MD   150 mg at 04/20/15 1200    ALLERGIES: Allergies  Allergen Reactions   Tape Rash   Codeine Nausea And Vomiting    FAMILY HISTORY: Family History  Problem Relation Age of Onset   Heart disease Mother        Stent in her 89s   Stroke Mother    Hypertension Mother    Heart failure  Father    Hypertension Brother    Heart disease Brother    Diverticulosis Sister    Hypertension Brother    Cancer Son        breast   Heart attack Neg Hx     Objective:  Blood pressure 130/86, pulse 86, height 5\' 3"  (1.6 m), weight 151 lb 12.8 oz (68.9 kg), SpO2 100 %. General: No acute distress.  Patient appears well-groomed.   Head:  Normocephalic/atraumatic Eyes:  fundi examined but not visualized Neck: supple, no paraspinal tenderness, full range of motion Back: No paraspinal tenderness Heart: regular rate and rhythm Lungs: Clear to auscultation bilaterally. Vascular: No carotid bruits. Neurological Exam: Mental status: alert and oriented to person, place, and time, recent and remote memory intact, fund of knowledge intact, attention and concentration intact, speech fluent and not dysarthric, language intact. Cranial nerves: CN I: not tested CN II: pupils equal, round and reactive to light, visual fields intact CN III, IV, VI:  full range of motion, no nystagmus, no ptosis CN V: facial sensation intact. CN VII: upper and lower face symmetric CN VIII: hearing intact CN IX, X: gag intact,  uvula midline CN XI: sternocleidomastoid and trapezius muscles intact CN XII: tongue midline Bulk & Tone: normal, no fasciculations. Motor:  muscle strength 5/5 throughout Sensation:  Pinprick sensation intact' vibratory sensation slightly reduced in toes. Deep Tendon Reflexes:  2+ upper extremities, absent lower extremities, toes downgoing Finger to nose testing:  Without dysmetria.   Heel to shin:  Without dysmetria.   Gait:  Normal station and stride.  Romberg negative.    Thank you for allowing me to take part in the care of this patient.  Metta Clines, DO  CC: Roma Schanz, DO

## 2020-12-08 NOTE — Telephone Encounter (Signed)
LMTCB to schedule prolia

## 2020-12-08 NOTE — Telephone Encounter (Signed)
Prolia scheduled for 12/20/20

## 2020-12-10 ENCOUNTER — Other Ambulatory Visit: Payer: Self-pay | Admitting: Family Medicine

## 2020-12-10 DIAGNOSIS — I10 Essential (primary) hypertension: Secondary | ICD-10-CM

## 2020-12-11 ENCOUNTER — Ambulatory Visit (INDEPENDENT_AMBULATORY_CARE_PROVIDER_SITE_OTHER): Payer: Medicare Other | Admitting: Neurology

## 2020-12-11 ENCOUNTER — Other Ambulatory Visit (INDEPENDENT_AMBULATORY_CARE_PROVIDER_SITE_OTHER): Payer: Medicare Other

## 2020-12-11 ENCOUNTER — Other Ambulatory Visit: Payer: Self-pay

## 2020-12-11 ENCOUNTER — Other Ambulatory Visit (HOSPITAL_BASED_OUTPATIENT_CLINIC_OR_DEPARTMENT_OTHER): Payer: Self-pay

## 2020-12-11 ENCOUNTER — Encounter: Payer: Self-pay | Admitting: Neurology

## 2020-12-11 VITALS — BP 130/86 | HR 86 | Ht 63.0 in | Wt 151.8 lb

## 2020-12-11 DIAGNOSIS — D510 Vitamin B12 deficiency anemia due to intrinsic factor deficiency: Secondary | ICD-10-CM

## 2020-12-11 DIAGNOSIS — G6289 Other specified polyneuropathies: Secondary | ICD-10-CM | POA: Diagnosis not present

## 2020-12-11 DIAGNOSIS — I7300 Raynaud's syndrome without gangrene: Secondary | ICD-10-CM | POA: Diagnosis not present

## 2020-12-11 DIAGNOSIS — R6889 Other general symptoms and signs: Secondary | ICD-10-CM

## 2020-12-11 LAB — VITAMIN B12: Vitamin B-12: 517 pg/mL (ref 211–911)

## 2020-12-11 LAB — SEDIMENTATION RATE: Sed Rate: 3 mm/hr (ref 0–30)

## 2020-12-11 LAB — TSH: TSH: 1.34 u[IU]/mL (ref 0.35–4.50)

## 2020-12-11 MED ORDER — GABAPENTIN 100 MG PO CAPS
100.0000 mg | ORAL_CAPSULE | Freq: Three times a day (TID) | ORAL | 5 refills | Status: DC
  Filled 2020-12-11: qty 90, 30d supply, fill #0

## 2020-12-11 MED ORDER — AMLODIPINE BESYLATE 5 MG PO TABS
ORAL_TABLET | Freq: Every day | ORAL | 1 refills | Status: DC
  Filled 2020-12-11: qty 90, 90d supply, fill #0
  Filled 2021-03-04: qty 90, 90d supply, fill #1

## 2020-12-11 MED ORDER — GABAPENTIN 100 MG PO CAPS: 100.0000 mg | ORAL_CAPSULE | Freq: Three times a day (TID) | ORAL | 5 refills | Status: DC

## 2020-12-11 NOTE — Patient Instructions (Addendum)
Start gabapentin 100mg .  May take three times a day.  We can increase dose in 4 weeks if needed.   Will check blood work:  ANA, sed rate, SSA/SSB antibodies, B12, TSH, ACE, SPEP/IFE  Nerve study left upper and lower extremities Further recommendations pending results.  Otherwise, follow up 6 months.

## 2020-12-11 NOTE — Addendum Note (Signed)
Addended byTomi Likens, Celedonio Sortino R on: 12/11/2020 11:40 AM   Modules accepted: Orders

## 2020-12-12 LAB — ANA W/REFLEX: Anti Nuclear Antibody (ANA): NEGATIVE

## 2020-12-13 LAB — PROTEIN ELECTROPHORESIS, SERUM
Albumin ELP: 4.2 g/dL (ref 3.8–4.8)
Alpha 1: 0.3 g/dL (ref 0.2–0.3)
Alpha 2: 0.5 g/dL (ref 0.5–0.9)
Beta 2: 0.3 g/dL (ref 0.2–0.5)
Beta Globulin: 0.4 g/dL (ref 0.4–0.6)
Gamma Globulin: 0.7 g/dL — ABNORMAL LOW (ref 0.8–1.7)
Total Protein: 6.4 g/dL (ref 6.1–8.1)

## 2020-12-13 LAB — SJOGREN'S SYNDROME ANTIBODS(SSA + SSB)
SSA (Ro) (ENA) Antibody, IgG: <1 AI
SSB (La) (ENA) Antibody, IgG: <1 AI

## 2020-12-13 LAB — ANGIOTENSIN CONVERTING ENZYME: Angiotensin-Converting Enzyme: 5 U/L — ABNORMAL LOW (ref 9–67)

## 2020-12-14 NOTE — Progress Notes (Signed)
Left message of the above

## 2020-12-15 ENCOUNTER — Other Ambulatory Visit (HOSPITAL_BASED_OUTPATIENT_CLINIC_OR_DEPARTMENT_OTHER): Payer: Self-pay

## 2020-12-18 ENCOUNTER — Other Ambulatory Visit (HOSPITAL_BASED_OUTPATIENT_CLINIC_OR_DEPARTMENT_OTHER): Payer: Self-pay

## 2020-12-18 ENCOUNTER — Other Ambulatory Visit: Payer: Self-pay | Admitting: Family Medicine

## 2020-12-18 MED ORDER — DICLOFENAC SODIUM 1 % EX GEL
CUTANEOUS | 2 refills | Status: AC
  Filled 2020-12-18: qty 100, 7d supply, fill #0
  Filled 2021-11-09: qty 100, 7d supply, fill #1
  Filled 2021-12-09: qty 100, 7d supply, fill #2

## 2020-12-20 ENCOUNTER — Other Ambulatory Visit (INDEPENDENT_AMBULATORY_CARE_PROVIDER_SITE_OTHER): Payer: Medicare Other

## 2020-12-20 ENCOUNTER — Ambulatory Visit (INDEPENDENT_AMBULATORY_CARE_PROVIDER_SITE_OTHER): Payer: Medicare Other

## 2020-12-20 ENCOUNTER — Other Ambulatory Visit: Payer: Self-pay

## 2020-12-20 DIAGNOSIS — E559 Vitamin D deficiency, unspecified: Secondary | ICD-10-CM

## 2020-12-20 DIAGNOSIS — M810 Age-related osteoporosis without current pathological fracture: Secondary | ICD-10-CM

## 2020-12-20 DIAGNOSIS — E890 Postprocedural hypothyroidism: Secondary | ICD-10-CM

## 2020-12-20 LAB — T4, FREE: Free T4: 1.12 ng/dL (ref 0.60–1.60)

## 2020-12-20 LAB — TSH: TSH: 1.33 u[IU]/mL (ref 0.35–4.50)

## 2020-12-20 MED ORDER — DENOSUMAB 60 MG/ML ~~LOC~~ SOSY
60.0000 mg | PREFILLED_SYRINGE | Freq: Once | SUBCUTANEOUS | Status: AC
  Administered 2020-12-20: 60 mg via SUBCUTANEOUS

## 2020-12-20 NOTE — Progress Notes (Signed)
Prolia injection administered to pt's left arm. Pt tolerated well. °

## 2020-12-21 LAB — VITAMIN D 25 HYDROXY (VIT D DEFICIENCY, FRACTURES): Vit D, 25-Hydroxy: 69.4 ng/mL (ref 30.0–100.0)

## 2020-12-26 NOTE — Telephone Encounter (Signed)
Pt received Prolia inj 12/20/20 Next due 06/22/21

## 2020-12-27 ENCOUNTER — Other Ambulatory Visit: Payer: Self-pay | Admitting: Family Medicine

## 2020-12-27 ENCOUNTER — Other Ambulatory Visit (HOSPITAL_BASED_OUTPATIENT_CLINIC_OR_DEPARTMENT_OTHER): Payer: Self-pay

## 2020-12-27 DIAGNOSIS — I10 Essential (primary) hypertension: Secondary | ICD-10-CM

## 2020-12-27 DIAGNOSIS — E039 Hypothyroidism, unspecified: Secondary | ICD-10-CM

## 2020-12-27 MED ORDER — LISINOPRIL 40 MG PO TABS
ORAL_TABLET | Freq: Every day | ORAL | 1 refills | Status: DC
  Filled 2020-12-27: qty 90, 90d supply, fill #0
  Filled 2021-04-14: qty 90, 90d supply, fill #1

## 2020-12-27 MED FILL — Levocetirizine Dihydrochloride Tab 5 MG: ORAL | 90 days supply | Qty: 90 | Fill #0 | Status: AC

## 2020-12-28 ENCOUNTER — Other Ambulatory Visit: Payer: Medicare Other

## 2021-01-02 ENCOUNTER — Other Ambulatory Visit (HOSPITAL_BASED_OUTPATIENT_CLINIC_OR_DEPARTMENT_OTHER): Payer: Self-pay

## 2021-01-17 ENCOUNTER — Other Ambulatory Visit: Payer: Self-pay

## 2021-01-17 ENCOUNTER — Ambulatory Visit (INDEPENDENT_AMBULATORY_CARE_PROVIDER_SITE_OTHER): Payer: Medicare Other | Admitting: Neurology

## 2021-01-17 DIAGNOSIS — R202 Paresthesia of skin: Secondary | ICD-10-CM

## 2021-01-17 DIAGNOSIS — G6289 Other specified polyneuropathies: Secondary | ICD-10-CM | POA: Diagnosis not present

## 2021-01-17 NOTE — Procedures (Signed)
[facility]  [address], [address]  [place], [place] [address] Tel: [phone] Fax:  [phone] Test Date:  01/17/2021  Patient: [name] DOB: 05/19/1941 Physician: Narda Amber, DO  Sex: Female Height: 5\' 3"  Ref Phys: Metta Clines, D.O.  ID#: [id number]   Technician:    Patient Complaints: This is a 80 year old female referred for evaluation of paresthesias of the hands and feet.  NCV & EMG Findings: Extensive electrodiagnostic testing of the left upper and lower extremity shows:  All sensory responses including the left median, ulnar, mixed palmer, sural, and superficial peroneal nerves are within normal limits.   Left median, ulnar, and tibial motor responses are within normal limits.  Left peroneal motor response at the extensor digitorum brevis is reduced, and normal at the tibialis anterior. Left tibial H reflex study is within normal limits. There is no evidence of active or chronic motor axonal loss changes affecting any of the tested muscles.  Motor unit configuration and recruitment pattern is within normal limits.  Impression: This is a normal study of the left upper and lower extremities.  In particular, there is no evidence of carpal tunnel syndrome, large fiber sensorimotor polyneuropathy, or cervical/lumbosacral radiculopathy affecting the left side.   ___________________________ Narda Amber, DO    Nerve Conduction Studies Anti Sensory Summary Table   Stim Site NR Peak (ms) Norm Peak (ms) P-T Amp (V) Norm P-T Amp  Left Median Anti Sensory (2nd Digit)  32C  Wrist    3.1 <3.8 42.6 >10  Left Sup Peroneal Anti Sensory (Ant Lat Mall)  32C  12 cm    1.9 <4.6 14.6 >3  Left Sural Anti Sensory (Lat Mall)  32C  Calf    2.1 <4.6 10.6 >3  Left Ulnar Anti Sensory (5th Digit)  32C  Wrist    2.9 <3.2 44.5 >5   Motor Summary Table   Stim Site NR Onset (ms) Norm Onset (ms) O-P Amp (mV) Norm O-P Amp Site1 Site2 Delta-0 (ms) Dist (cm) Vel (m/s) Norm  Vel (m/s)  Left Median Motor (Abd Poll Brev)  32C  Wrist    2.7 <4.0 11.4 >5 Elbow Wrist 4.6 28.0 61 >50  Elbow    7.3  11.0         Left Peroneal Motor (Ext Dig Brev)  32C  Ankle    3.0 <6.0 1.3 >2.5 B Fib Ankle 6.5 35.0 54 >40  B Fib    9.5  1.2  Poplt B Fib 1.4 7.0 50 >40  Poplt    10.9  1.2         Left Peroneal TA Motor (Tib Ant)  32C  Fib Head    3.4 <4.5 3.3 >3 Poplit Fib Head 1.2 7.0 58 >40  Poplit    4.6  3.2         Left Tibial Motor (Abd Hall Brev)  32C  Ankle    3.5 <6.0 7.8 >4 Knee Ankle 7.4 40.0 54 >40  Knee    10.9  6.1         Left Ulnar Motor (Abd Dig Minimi)  32C  Wrist    2.2 <3.1 8.7 >7 B Elbow Wrist 3.0 20.0 67 >50  B Elbow    5.2  7.8  A Elbow B Elbow 1.6 10.0 63 >50  A Elbow    6.8  7.6          Comparison Summary Table   Stim Site NR Peak (ms) Norm  Peak (ms) P-T Amp (V) Site1 Site2 Delta-P (ms) Norm Delta (ms)  Left Median/Ulnar Palm Comparison (Wrist - 8cm)  32C  Median Palm    1.4 <2.2 59.8 Median Palm Ulnar Palm 0.2   Ulnar Palm    1.6 <2.2 13.6       H Reflex Studies   NR H-Lat (ms) Lat Norm (ms) L-R H-Lat (ms)  Left Tibial (Gastroc)  32C     32.65 <35    EMG   Side Muscle Ins Act Fibs Psw Fasc Number Recrt Dur Dur. Amp Amp. Poly Poly. Comment  Left AntTibialis Nml Nml Nml Nml Nml Nml Nml Nml Nml Nml Nml Nml N/A  Left Gastroc Nml Nml Nml Nml Nml Nml Nml Nml Nml Nml Nml Nml N/A  Left RectFemoris Nml Nml Nml Nml Nml Nml Nml Nml Nml Nml Nml Nml N/A  Left BicepsFemS Nml Nml Nml Nml Nml Nml Nml Nml Nml Nml Nml Nml N/A  Left 1stDorInt Nml Nml Nml Nml Nml Nml Nml Nml Nml Nml Nml Nml N/A  Left PronatorTeres Nml Nml Nml Nml Nml Nml Nml Nml Nml Nml Nml Nml N/A  Left Biceps Nml Nml Nml Nml Nml Nml Nml Nml Nml Nml Nml Nml N/A  Left Triceps Nml Nml Nml Nml Nml Nml Nml Nml Nml Nml Nml Nml N/A  Left Deltoid Nml Nml Nml Nml Nml Nml Nml Nml Nml Nml Nml Nml N/A      Waveforms:

## 2021-02-07 ENCOUNTER — Ambulatory Visit (INDEPENDENT_AMBULATORY_CARE_PROVIDER_SITE_OTHER): Payer: Medicare Other

## 2021-02-07 DIAGNOSIS — I442 Atrioventricular block, complete: Secondary | ICD-10-CM | POA: Diagnosis not present

## 2021-02-08 LAB — CUP PACEART REMOTE DEVICE CHECK
Battery Impedance: 2030 Ohm
Battery Remaining Longevity: 31 mo
Battery Voltage: 2.74 V
Brady Statistic AP VP Percent: 17 %
Brady Statistic AP VS Percent: 0 %
Brady Statistic AS VP Percent: 82 %
Brady Statistic AS VS Percent: 0 %
Date Time Interrogation Session: 20220811102704
Implantable Lead Implant Date: 20041110
Implantable Lead Implant Date: 20041110
Implantable Lead Location: 753859
Implantable Lead Location: 753860
Implantable Pulse Generator Implant Date: 20130318
Lead Channel Impedance Value: 381 Ohm
Lead Channel Impedance Value: 519 Ohm
Lead Channel Pacing Threshold Amplitude: 0.5 V
Lead Channel Pacing Threshold Amplitude: 0.625 V
Lead Channel Pacing Threshold Pulse Width: 0.4 ms
Lead Channel Pacing Threshold Pulse Width: 0.4 ms
Lead Channel Setting Pacing Amplitude: 2 V
Lead Channel Setting Pacing Amplitude: 2.5 V
Lead Channel Setting Pacing Pulse Width: 0.4 ms
Lead Channel Setting Sensing Sensitivity: 4 mV

## 2021-03-01 NOTE — Progress Notes (Signed)
Remote pacemaker transmission.   

## 2021-03-06 ENCOUNTER — Other Ambulatory Visit (HOSPITAL_BASED_OUTPATIENT_CLINIC_OR_DEPARTMENT_OTHER): Payer: Self-pay

## 2021-03-06 MED FILL — Loratadine Tab 10 MG: ORAL | 100 days supply | Qty: 100 | Fill #1 | Status: AC

## 2021-03-09 ENCOUNTER — Encounter: Payer: Self-pay | Admitting: Family Medicine

## 2021-03-09 ENCOUNTER — Other Ambulatory Visit (HOSPITAL_BASED_OUTPATIENT_CLINIC_OR_DEPARTMENT_OTHER): Payer: Self-pay

## 2021-03-19 NOTE — Telephone Encounter (Signed)
Do you recall if this was done or if it was mailed?

## 2021-03-21 ENCOUNTER — Ambulatory Visit (INDEPENDENT_AMBULATORY_CARE_PROVIDER_SITE_OTHER): Payer: Medicare Other

## 2021-03-21 VITALS — Ht 63.0 in | Wt 151.0 lb

## 2021-03-21 DIAGNOSIS — Z1231 Encounter for screening mammogram for malignant neoplasm of breast: Secondary | ICD-10-CM

## 2021-03-21 DIAGNOSIS — Z0000 Encounter for general adult medical examination without abnormal findings: Secondary | ICD-10-CM | POA: Diagnosis not present

## 2021-03-21 NOTE — Progress Notes (Signed)
Subjective:   [name] is a 80 y.o. female who presents for Medicare Annual (Subsequent) preventive examination.  I connected with Merisa today by telephone and verified that I am speaking with the correct person using two identifiers. Location patient: home Location provider: work Persons participating in the virtual visit: patient, Marine scientist.    I discussed the limitations, risks, security and privacy concerns of performing an evaluation and management service by telephone and the availability of in person appointments. I also discussed with the patient that there may be a patient responsible charge related to this service. The patient expressed understanding and verbally consented to this telephonic visit.    Interactive audio and video telecommunications were attempted between this provider and patient, however failed, due to patient having technical difficulties OR patient did not have access to video capability.  We continued and completed visit with audio only.  Some vital signs may be absent or patient reported.   Time Spent with patient on telephone encounter: 30 minutes   Review of Systems     Cardiac Risk Factors include: advanced age (>34men, >73 women);hypertension;dyslipidemia     Objective:    Today's Vitals   03/21/21 1537  Weight: 151 lb (68.5 kg)  Height: 5\' 3"  (1.6 m)   Body mass index is 26.75 kg/m.  Advanced Directives 03/21/2021 09/13/2020 07/25/2016 06/03/2016 03/20/2016 03/12/2016 07/28/2015  Does Patient Have a Medical Advance Directive? No No No No No No -  Type of Advance Directive - - - - - - Healthcare Power of Attorney  Would patient like information on creating a medical advance directive? Yes (MAU/Ambulatory/Procedural Areas - Information given) No - Patient declined No - Patient declined No - Patient declined No - patient declined information No - patient declined information -  Pre-existing out of facility DNR order (yellow form or pink MOST  form) - - - - - - -    Current Medications (verified) Outpatient Encounter Medications as of 03/21/2021  Medication Sig   acetaminophen (TYLENOL) 500 MG tablet Take 1,000 mg by mouth daily as needed for moderate pain.   amLODipine (NORVASC) 5 MG tablet TAKE 1 TABLET (5 MG TOTAL) BY MOUTH DAILY.   apixaban (ELIQUIS) 5 MG TABS tablet Take 1 tablet (5 mg total) by mouth 2 (two) times daily.   denosumab (PROLIA) 60 MG/ML SOSY injection Inject 60 mg into the skin every 6 (six) months.   diclofenac Sodium (VOLTAREN) 1 % GEL APPLY 4 GRAMS ON TO THE SKIN 4 (FOUR) TIMES DAILY.   diphenhydramine-acetaminophen (TYLENOL PM) 25-500 MG TABS tablet Take 2 tablets by mouth at bedtime as needed (sleep/pain).   gabapentin (NEURONTIN) 100 MG capsule Take 1 capsule (100 mg total) by mouth 3 (three) times daily.   levocetirizine (XYZAL) 5 MG tablet TAKE 1 TABLET (5 MG TOTAL) BY MOUTH EVERY EVENING.   levothyroxine (SYNTHROID) 100 MCG tablet TAKE 1 TABLET BY MOUTH ONCE DAILY BEFORE BREAKFAST   lisinopril (ZESTRIL) 40 MG tablet Take 1 tablet by mouth daily.   loratadine (CLARITIN) 10 MG tablet TAKE 1 TABLET (10 MG TOTAL) BY MOUTH DAILY AS NEEDED FOR ALLERGIES.   metoprolol tartrate (LOPRESSOR) 25 MG tablet Take 1 tablet (25 mg total) by mouth 2 (two) times daily.   pantoprazole (PROTONIX) 40 MG tablet Take 1 tablet (40 mg total) by mouth 2 (two) times daily before a meal.   COVID-19 mRNA vaccine, Pfizer, 30 MCG/0.3ML injection INJECT AS DIRECTED (Patient not taking: No sig reported)   fluticasone (  FLONASE) 50 MCG/ACT nasal spray PLACE 2 SPRAYS INTO BOTH NOSTRILS DAILY AS NEEDED FOR ALLERGIES OR RHINITIS.   [DISCONTINUED] hydrochlorothiazide (HYDRODIURIL) 12.5 MG tablet TAKE 1 TABLET BY MOUTH ONCE DAILY (Patient taking differently: Take 12.5 mg by mouth daily.)   Facility-Administered Encounter Medications as of 03/21/2021  Medication   aminophylline injection 150 mg    Allergies (verified) Tape and Codeine    History: Past Medical History:  Diagnosis Date   Atrioventricular block, complete ([place])    a. 08/2011 Upgrade of PPM to MDT Adapta L Dual Chamber PPM ser # [id number].   Cardiac pacemaker in situ    Contact dermatitis and eczema    unspec cause   Dairy product intolerance    Depression    Diastolic dysfunction    GERD (gastroesophageal reflux disease)    Hiatal hernia    History of diverticulitis of colon    2003  perforated diverticulitis w/ surgical intervention   History of kidney stones    age 53   History of peripheral edema    lower extremities   History of thyroid nodule    multinodular goiter  s/p  total thyroidectomy 2014   HTN (hypertension)    Hydronephrosis, left    Hypothyroidism    Mild sleep apnea    per study 04-14-2014   Nocturia    NSVT (nonsustained ventricular tachycardia) ([facility])    Osteoporosis    PAF (paroxysmal atrial fibrillation) ([facility])    PONV (postoperative nausea and vomiting)    Pulmonary nodules    stable per last ct   Renal cell carcinoma of left kidney ([place])    Superficial thrombophlebitis    Past Surgical History:  Procedure Laterality Date   CARDIOVASCULAR STRESS TEST  04-20-2015  dr Caryl Comes   normal nuclear study/  no ischemia/  normal LV function and wall motion, ef 74%   COLONOSCOPY  01/19/2013   COLOSTOMY TAKEDOWN  07-22-2002   CYSTOSCOPY WITH RETROGRADE PYELOGRAM, URETEROSCOPY AND STENT PLACEMENT Left 07/28/2015   Procedure: CYSTOSCOPY WITH LEFT RETROGRADE PYELOGRAM, POSSIBLE LEFT URETEROSCOPY AND STENT PLACEMENT;  Surgeon: Franchot Gallo, MD;  Location[facility];  Service: Urology;  Laterality: Left;   EXPLORATORY LAPARTOMY SIGMOID COLECTOMY/ COLOSTOMY  04-28-2002   perforated diverticulitis   KYPHOPLASTY     LAPAROSCOPY VENTRAL HERNIA REPAIR/  EXTENSIVE LYSIS ADHESIONS  05-04-2004   PACEMAKER GENERATOR CHANGE Left 09/16/2011   MDT ADDRL1 pacemaker   PACEMAKER PLACEMENT  05-11-2003     medtronic   ROBOT  ASSISTED LAPAROSCOPIC NEPHRECTOMY Left 03/20/2016   Procedure: XI ROBOTIC ASSISTED LAPAROSCOPIC RETROPERITONEAL NEPHRECTOMY;  Surgeon: Alexis Frock, MD;  Location: [facility];  Service: Urology;  Laterality: Left;   THYROIDECTOMY N/A 08/07/2012   Procedure: THYROIDECTOMY;  Surgeon: Earnstine Regal, MD;  Location: [facility];  Service: General;  Laterality: N/A;   TRANSTHORACIC ECHOCARDIOGRAM  10-12-2014   moderate focal basal LVH,  ef 55-60%,  grade 2 diastolic dysfunction, mild AV calcification without stenosis,  mild MR,  trivial PR   Family History  Problem Relation Age of Onset   Heart disease Mother        Stent in her 104s   Stroke Mother    Hypertension Mother    Heart failure Father    Hypertension Brother    Heart disease Brother    Diverticulosis Sister    Hypertension Brother    Cancer Son        breast   Heart attack Neg Hx  Social History   Socioeconomic History   Marital status: Single    Spouse name: Not on file   Number of children: 2   Years of education: 16   Highest education level: Not on file  Occupational History   Occupation: Retired Production assistant, radio for the [employer]    Employer: RETIRED  Tobacco Use   Smoking status: Former    Years: 15.00    Types: Cigarettes    Quit date: 10/06/1979    Years since quitting: 41.4   Smokeless tobacco: Never  Substance and Sexual Activity   Alcohol use: No   Drug use: No   Sexual activity: Not on file  Other Topics Concern   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  She was married for 22 years, divorced, and has remained single. She has 1 grown son, 1 grown daughter.  She has 3 grandchildren, 2 of her children   live in [place].  She is retired.  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 She had a long term companion, 32 years, who passed away 19-Sep-2010.   Social Determinants of  Health   Financial Resource Strain: Low Risk    Difficulty of Paying Living Expenses: Not hard at all  Food Insecurity: No Food Insecurity   Worried About Charity fundraiser in the Last Year: Never true   [place] in the Last Year: Never true  Transportation Needs: No Transportation Needs   Lack of Transportation (Medical): No   Lack of Transportation (Non-Medical): No  Physical Activity: Insufficiently Active   Days of Exercise per Week: 4 days   Minutes of Exercise per Session: 30 min  Stress: No Stress Concern Present   Feeling of Stress : Not at all  Social Connections: Socially Isolated   Frequency of Communication with Friends and Family: More than three times a week   Frequency of Social Gatherings with Friends and Family: More than three times a week   Attends Religious Services: Never   Marine scientist or Organizations: No   Attends Music therapist: Never   Marital Status: Divorced    Tobacco Counseling Counseling given: Not Answered   Clinical Intake:  Pre-visit preparation completed: Yes  Pain : No/denies pain     BMI - recorded: 26.75 Nutritional Status: BMI 25 -29 Overweight Nutritional Risks: None Diabetes: No  How often do you need to have someone help you when you read instructions, pamphlets, or other written materials from your doctor or pharmacy?: 1 - Never  Diabetic?No  Interpreter Needed?: No  Information entered by :: Caroleen Hamman LPN   Activities of Daily Living In your present state of health, do you have any difficulty performing the following activities: 03/21/2021  Hearing? N  Vision? N  Difficulty concentrating or making decisions? N  Walking or climbing stairs? N  Dressing or bathing? N  Doing errands, shopping? N  Preparing Food and eating ? N  Using the Toilet? N  In the past six months, have you accidently leaked urine? N  Do you have problems with loss of bowel control? N  Managing your  Medications? N  Managing your Finances? N  Housekeeping or managing your Housekeeping? N  Some recent data might be hidden    Patient Care Team: Carollee Herter, Kendrick Fries  R, DO as PCP - General (Family Medicine) Deboraha Sprang, MD as PCP - Cardiology (Cardiology) Alexis Frock, MD as Consulting Physician (Urology) Deboraha Sprang, MD as Consulting Physician (Cardiology) Ronald Lobo, MD as Consulting Physician (Gastroenterology)  Indicate any recent Medical Services you may have received from other than [facility] providers in the past year (date may be approximate).     Assessment:   This is a routine wellness examination for Denys.  Hearing/Vision screen Hearing Screening - Comments:: No issues Vision Screening - Comments:: Last eye exam-10/2920-[facility] Wears glasses  Dietary issues and exercise activities discussed: Current Exercise Habits: Home exercise routine, Type of exercise: treadmill, Time (Minutes): 30, Frequency (Times/Week): 4, Weekly Exercise (Minutes/Week): 120, Intensity: Mild, Exercise limited by: None identified   Goals Addressed             This Visit's Progress    Patient Stated       Increase activity       Depression Screen PHQ 2/9 Scores 03/21/2021 06/03/2016 02/28/2015 11/12/2013  PHQ - 2 Score 0 0 0 0    Fall Risk Fall Risk  03/21/2021 09/13/2020 05/26/2019 05/21/2018 06/07/2016  Falls in the past year? 0 0 0 0 No  Comment - - [facility] Telephone Survey: data to providers prior to load [employer] Telephone Survey: data to providers prior to load [facility] Telephone Survey: data to providers prior to load  Number falls in past yr: 0 0 - - -  Injury with Fall? 0 0 - - -  Risk for fall due to : - No Fall Risks - - -  Follow up Falls prevention discussed Falls evaluation completed - - -    FALL RISK PREVENTION PERTAINING TO THE HOME:  Any stairs in or around the home? No  Home free of loose throw rugs in walkways, pet beds, electrical cords, etc? Yes  Adequate  lighting in your home to reduce risk of falls? Yes   ASSISTIVE DEVICES UTILIZED TO PREVENT FALLS:  Life alert? No  Use of a cane, walker or w/c? No  Grab bars in the bathroom? Yes  Shower chair or bench in shower? No  Elevated toilet seat or a handicapped toilet? No   TIMED UP AND GO:  Was the test performed? No . Phone visit   Cognitive Function:Normal cognitive status assessed by this Nurse Health Advisor. No abnormalities found.   MMSE - Mini Mental State Exam 06/03/2016  Orientation to time 5  Orientation to Place 5  Registration 3  Attention/ Calculation 5  Recall 3  Language- name 2 objects 2  Language- repeat 1  Language- follow 3 step command 3  Language- read & follow direction 1  Write a sentence 1  Copy design 1  Total score 30        Immunizations Immunization History  Administered Date(s) Administered   Fluad Quad(high Dose 65+) 03/26/2019, 04/03/2020   Influenza Split 04/10/2011, 03/10/2012   Influenza Whole 03/24/2008, 04/11/2009   Influenza, High Dose Seasonal PF 03/28/2017   Influenza,inj,Quad PF,6+ Mos 03/24/2013, 03/24/2014, 04/04/2015, 03/21/2016   PFIZER(Purple Top)SARS-COV-2 Vaccination 08/09/2019, 09/06/2019, 05/08/2020   Pneumococcal Conjugate-13 06/02/2013   Pneumococcal Polysaccharide-23 08/29/2014    TDAP status: Due, Education has been provided regarding the importance of this vaccine. Advised may receive this vaccine at local pharmacy or Health Dept. Aware to provide a copy of the vaccination record if obtained from local pharmacy or Health Dept. Verbalized acceptance and understanding.  Flu Vaccine status: Due, Education has been  provided regarding the importance of this vaccine. Advised may receive this vaccine at local pharmacy or Health Dept. Aware to provide a copy of the vaccination record if obtained from local pharmacy or Health Dept. Verbalized acceptance and understanding.  Pneumococcal vaccine status: Up to date  Covid-19  vaccine status: Information provided on how to obtain vaccines.   Qualifies for Shingles Vaccine? No   Zostavax completed No   Shingrix Completed?: No.    Education has been provided regarding the importance of this vaccine. Patient has been advised to call insurance company to determine out of pocket expense if they have not yet received this vaccine. Advised may also receive vaccine at local pharmacy or Health Dept. Verbalized acceptance and understanding.  Screening Tests Health Maintenance  Topic Date Due   TETANUS/TDAP  Never done   Zoster Vaccines- Shingrix (1 of 2) Never done   COVID-19 Vaccine (4 - Booster for [employer] series) 07/31/2020   INFLUENZA VACCINE  01/29/2021   DEXA SCAN  Completed   HPV VACCINES  Aged Out    Health Maintenance  Health Maintenance Due  Topic Date Due   TETANUS/TDAP  Never done   Zoster Vaccines- Shingrix (1 of 2) Never done   COVID-19 Vaccine (4 - Booster for Pfizer series) 07/31/2020   INFLUENZA VACCINE  01/29/2021    Colorectal cancer screening: No longer required.   Mammogram status: Ordered today. Pt provided with contact info and advised to call to schedule appt.   Bone Density status: Completed 12/02/2019. Results reflect: Bone density results: OSTEOPOROSIS. Repeat every 2 years.  Lung Cancer Screening: (Low Dose CT Chest recommended if Age 66-80 years, 30 pack-year currently smoking OR have quit w/in 15years.) does not qualify.     Additional Screening:  Hepatitis C Screening: does not qualify  Vision Screening: Recommended annual ophthalmology exams for early detection of glaucoma and other disorders of the eye. Is the patient up to date with their annual eye exam?  Yes  Who is the provider or what is the name of the office in which the patient attends annual eye exams? [facility] Screening: Recommended annual dental exams for proper oral hygiene  Community Resource Referral / Chronic Care Management: CRR  required this visit?  No   CCM required this visit?  No      Plan:     I have personally reviewed and noted the following in the patient's chart:   Medical and social history Use of alcohol, tobacco or illicit drugs  Current medications and supplements including opioid prescriptions.  Functional ability and status Nutritional status Physical activity Advanced directives List of other physicians Hospitalizations, surgeries, and ER visits in previous 12 months Vitals Screenings to include cognitive, depression, and falls Referrals and appointments  In addition, I have reviewed and discussed with patient certain preventive protocols, quality metrics, and best practice recommendations. A written personalized care plan for preventive services as well as general preventive health recommendations were provided to patient.   Due to this being a telephonic visit, the after visit summary with patients personalized plan was offered to patient via mail or my-chart. Patient would like to access on my-chart.   Marta Antu, LPN   [id number]  Nurse Health Advisor  Nurse Notes: None

## 2021-03-21 NOTE — Patient Instructions (Signed)
[name] , Thank you for taking time to come for your Medicare Wellness Visit. I appreciate your ongoing commitment to your health goals. Please review the following plan we discussed and let me know if I can assist you in the future.   Screening recommendations/referrals: Colonoscopy: No longer required Mammogram: Ordered today. Someone will call you to schedule Bone Density: Completed 12/02/2019-Due 12/01/2021 Recommended yearly ophthalmology/optometry visit for glaucoma screening and checkup Recommended yearly dental visit for hygiene and checkup  Vaccinations: Influenza vaccine: Scheduled for 03/27/21 Pneumococcal vaccine: Up to date Tdap vaccine: Discuss with pharmacy Shingles vaccine: Discuss with pharmacy   Covid-19:Booster available at the pharmacy  Advanced directives: Information mailed today  Conditions/risks identified: See problem list  Next appointment: Follow up in one year for your annual wellness visit    Preventive Care 65 Years and Older, Female Preventive care refers to lifestyle choices and visits with your health care provider that can promote health and wellness. What does preventive care include? A yearly physical exam. This is also called an annual well check. Dental exams once or twice a year. Routine eye exams. Ask your health care provider how often you should have your eyes checked. Personal lifestyle choices, including: Daily care of your teeth and gums. Regular physical activity. Eating a healthy diet. Avoiding tobacco and drug use. Limiting alcohol use. Practicing safe sex. Taking low-dose aspirin every day. Taking vitamin and mineral supplements as recommended by your health care provider. What happens during an annual well check? The services and screenings done by your health care provider during your annual well check will depend on your age, overall health, lifestyle risk factors, and family history of disease. Counseling  Your health care  provider may ask you questions about your: Alcohol use. Tobacco use. Drug use. Emotional well-being. Home and relationship well-being. Sexual activity. Eating habits. History of falls. Memory and ability to understand (cognition). Work and work Statistician. Reproductive health. Screening  You may have the following tests or measurements: Height, weight, and BMI. Blood pressure. Lipid and cholesterol levels. These may be checked every 5 years, or more frequently if you are over 30 years old. Skin check. Lung cancer screening. You may have this screening every year starting at age 75 if you have a 30-pack-year history of smoking and currently smoke or have quit within the past 15 years. Fecal occult blood test (FOBT) of the stool. You may have this test every year starting at age 55. Flexible sigmoidoscopy or colonoscopy. You may have a sigmoidoscopy every 5 years or a colonoscopy every 10 years starting at age 45. Hepatitis C blood test. Hepatitis B blood test. Sexually transmitted disease (STD) testing. Diabetes screening. This is done by checking your blood sugar (glucose) after you have not eaten for a while (fasting). You may have this done every 1-3 years. Bone density scan. This is done to screen for osteoporosis. You may have this done starting at age 43. Mammogram. This may be done every 1-2 years. Talk to your health care provider about how often you should have regular mammograms. Talk with your health care provider about your test results, treatment options, and if necessary, the need for more tests. Vaccines  Your health care provider may recommend certain vaccines, such as: Influenza vaccine. This is recommended every year. Tetanus, diphtheria, and acellular pertussis (Tdap, Td) vaccine. You may need a Td booster every 10 years. Zoster vaccine. You may need this after age 53. Pneumococcal 13-valent conjugate (PCV13) vaccine. One dose is  recommended after age  76. Pneumococcal polysaccharide (PPSV23) vaccine. One dose is recommended after age 10. Talk to your health care provider about which screenings and vaccines you need and how often you need them. This information is not intended to replace advice given to you by your health care provider. Make sure you discuss any questions you have with your health care provider. Document Released: 07/14/2015 Document Revised: 03/06/2016 Document Reviewed: 04/18/2015 Elsevier Interactive Patient Education  2017 [employer] Prevention in the Home Falls can cause injuries. They can happen to people of all ages. There are many things you can do to make your home safe and to help prevent falls. What can I do on the outside of my home? Regularly fix the edges of walkways and driveways and fix any cracks. Remove anything that might make you trip as you walk through a door, such as a raised step or threshold. Trim any bushes or trees on the path to your home. Use bright outdoor lighting. Clear any walking paths of anything that might make someone trip, such as rocks or tools. Regularly check to see if handrails are loose or broken. Make sure that both sides of any steps have handrails. Any raised decks and porches should have guardrails on the edges. Have any leaves, snow, or ice cleared regularly. Use sand or salt on walking paths during winter. Clean up any spills in your garage right away. This includes oil or grease spills. What can I do in the bathroom? Use night lights. Install grab bars by the toilet and in the tub and shower. Do not use towel bars as grab bars. Use non-skid mats or decals in the tub or shower. If you need to sit down in the shower, use a plastic, non-slip stool. Keep the floor dry. Clean up any water that spills on the floor as soon as it happens. Remove soap buildup in the tub or shower regularly. Attach bath mats securely with double-sided non-slip rug tape. Do not have throw  rugs and other things on the floor that can make you trip. What can I do in the bedroom? Use night lights. Make sure that you have a light by your bed that is easy to reach. Do not use any sheets or blankets that are too big for your bed. They should not hang down onto the floor. Have a firm chair that has side arms. You can use this for support while you get dressed. Do not have throw rugs and other things on the floor that can make you trip. What can I do in the kitchen? Clean up any spills right away. Avoid walking on wet floors. Keep items that you use a lot in easy-to-reach places. If you need to reach something above you, use a strong step stool that has a grab bar. Keep electrical cords out of the way. Do not use floor polish or wax that makes floors slippery. If you must use wax, use non-skid floor wax. Do not have throw rugs and other things on the floor that can make you trip. What can I do with my stairs? Do not leave any items on the stairs. Make sure that there are handrails on both sides of the stairs and use them. Fix handrails that are broken or loose. Make sure that handrails are as long as the stairways. Check any carpeting to make sure that it is firmly attached to the stairs. Fix any carpet that is loose or worn. Avoid having  throw rugs at the top or bottom of the stairs. If you do have throw rugs, attach them to the floor with carpet tape. Make sure that you have a light switch at the top of the stairs and the bottom of the stairs. If you do not have them, ask someone to add them for you. What else can I do to help prevent falls? Wear shoes that: Do not have high heels. Have rubber bottoms. Are comfortable and fit you well. Are closed at the toe. Do not wear sandals. If you use a stepladder: Make sure that it is fully opened. Do not climb a closed stepladder. Make sure that both sides of the stepladder are locked into place. Ask someone to hold it for you, if  possible. Clearly mark and make sure that you can see: Any grab bars or handrails. First and last steps. Where the edge of each step is. Use tools that help you move around (mobility aids) if they are needed. These include: Canes. Walkers. Scooters. Crutches. Turn on the lights when you go into a dark area. Replace any light bulbs as soon as they burn out. Set up your furniture so you have a clear path. Avoid moving your furniture around. If any of your floors are uneven, fix them. If there are any pets around you, be aware of where they are. Review your medicines with your doctor. Some medicines can make you feel dizzy. This can increase your chance of falling. Ask your doctor what other things that you can do to help prevent falls. This information is not intended to replace advice given to you by your health care provider. Make sure you discuss any questions you have with your health care provider. Document Released: 04/13/2009 Document Revised: 11/23/2015 Document Reviewed: 07/22/2014 Elsevier Interactive Patient Education  2017 [employer].

## 2021-03-27 ENCOUNTER — Encounter: Payer: Self-pay | Admitting: Family Medicine

## 2021-03-27 ENCOUNTER — Ambulatory Visit (INDEPENDENT_AMBULATORY_CARE_PROVIDER_SITE_OTHER): Payer: Medicare Other

## 2021-03-27 ENCOUNTER — Other Ambulatory Visit: Payer: Self-pay

## 2021-03-27 DIAGNOSIS — Z23 Encounter for immunization: Secondary | ICD-10-CM | POA: Diagnosis not present

## 2021-03-27 NOTE — Progress Notes (Signed)
[name] is a 80 y.o. female presents to the office today for High Dose flu shot, per physician's orders. Original order: 03/27/21 Fluquad high dose 0.5 mL,  IM was administered L deltoid today. Patient tolerated injection.  Given VIS and Consent form.   Creft, Darlis Loan

## 2021-04-01 ENCOUNTER — Other Ambulatory Visit: Payer: Self-pay | Admitting: Family Medicine

## 2021-04-01 DIAGNOSIS — E039 Hypothyroidism, unspecified: Secondary | ICD-10-CM

## 2021-04-06 ENCOUNTER — Telehealth (HOSPITAL_BASED_OUTPATIENT_CLINIC_OR_DEPARTMENT_OTHER): Payer: Self-pay

## 2021-04-11 ENCOUNTER — Telehealth: Payer: Self-pay

## 2021-04-11 NOTE — Telephone Encounter (Signed)
Left message on patient's voice mail asking her to return call to office so that we can update her mailing address as we had return mail for her from her Medicare Wellness visit with [name].

## 2021-04-11 NOTE — Telephone Encounter (Signed)
Pt. Called back, updated address.

## 2021-04-14 MED FILL — Levocetirizine Dihydrochloride Tab 5 MG: ORAL | 90 days supply | Qty: 90 | Fill #1 | Status: AC

## 2021-04-16 ENCOUNTER — Other Ambulatory Visit (HOSPITAL_BASED_OUTPATIENT_CLINIC_OR_DEPARTMENT_OTHER): Payer: Self-pay

## 2021-04-20 ENCOUNTER — Other Ambulatory Visit (HOSPITAL_BASED_OUTPATIENT_CLINIC_OR_DEPARTMENT_OTHER): Payer: Self-pay

## 2021-05-08 ENCOUNTER — Ambulatory Visit (INDEPENDENT_AMBULATORY_CARE_PROVIDER_SITE_OTHER): Payer: Medicare Other | Admitting: Family Medicine

## 2021-05-08 ENCOUNTER — Encounter: Payer: Self-pay | Admitting: Family Medicine

## 2021-05-08 ENCOUNTER — Other Ambulatory Visit: Payer: Self-pay

## 2021-05-08 VITALS — BP 132/84 | HR 67 | Temp 97.8°F | Resp 18 | Ht 63.0 in | Wt 153.0 lb

## 2021-05-08 DIAGNOSIS — I10 Essential (primary) hypertension: Secondary | ICD-10-CM

## 2021-05-08 DIAGNOSIS — D1722 Benign lipomatous neoplasm of skin and subcutaneous tissue of left arm: Secondary | ICD-10-CM | POA: Diagnosis not present

## 2021-05-08 DIAGNOSIS — E039 Hypothyroidism, unspecified: Secondary | ICD-10-CM

## 2021-05-08 MED ORDER — DOXAZOSIN MESYLATE 1 MG PO TABS: 1.0000 mg | ORAL_TABLET | Freq: Every day | ORAL | 1 refills | Status: DC

## 2021-05-08 MED ORDER — LEVOTHYROXINE SODIUM 100 MCG PO TABS: 100.0000 ug | ORAL_TABLET | Freq: Every day | ORAL | 3 refills | Status: DC

## 2021-05-08 NOTE — Progress Notes (Addendum)
Subjective:   By signing my name below, I, [name], attest that this documentation has been prepared under the direction and in the presence of Dr. Roma Schanz, DO. 05/08/2021    Patient ID: [name], female    DOB: October 20, 1940, 80 y.o.   MRN: [id number]  Chief Complaint  Patient presents with   Hypothyroidism   Follow-up    HPI Patient is in today for a office visit.   Arm pain/Lipoma- She complains of left arm pain. She has a lipoma on her upper arm. She is interested in seeing a surgeon to remove it.  Hair loss- She complains of hair loss on her scalp. She thinks 5 mg amlodipine is contributing to her hair loss and is requesting to switch to another medication to manage her blood pressure.  Thyroid- She is requesting a refill for 100 mcg synthroid daily PO. She continues taking it and reports no new issues at this time.  Blood pressure- She regularly measures her blood pressure at home and reports it measures high. She thinks her machine is not working properly. She continues taking 40 mg lisinopril daily PO, 5 mg amlodipine daily PO, 25 mg metoprolol tartrate daily PO and reports no new issues while taking it.   BP Readings from Last 3 Encounters:  05/08/21 132/84  12/11/20 130/86  11/14/20 140/82   Pulse Readings from Last 3 Encounters:  05/08/21 67  12/11/20 86  11/14/20 67    Past Medical History:  Diagnosis Date   Atrioventricular block, complete ([place])    a. 08/2011 Upgrade of PPM to MDT Adapta L Dual Chamber PPM ser # [id number].   Cardiac pacemaker in situ    Contact dermatitis and eczema    unspec cause   Dairy product intolerance    Depression    Diastolic dysfunction    GERD (gastroesophageal reflux disease)    Hiatal hernia    History of diverticulitis of colon    2003  perforated diverticulitis w/ surgical intervention   History of kidney stones    age 44   History of peripheral edema    lower extremities   History of thyroid nodule     multinodular goiter  s/p  total thyroidectomy 2014   HTN (hypertension)    Hydronephrosis, left    Hypothyroidism    Mild sleep apnea    per study 04-14-2014   Nocturia    NSVT (nonsustained ventricular tachycardia)    Osteoporosis    PAF (paroxysmal atrial fibrillation) ([facility])    PONV (postoperative nausea and vomiting)    Pulmonary nodules    stable per last ct   Renal cell carcinoma of left kidney ([place])    Superficial thrombophlebitis     Past Surgical History:  Procedure Laterality Date   CARDIOVASCULAR STRESS TEST  04-20-2015  dr Caryl Comes   normal nuclear study/  no ischemia/  normal LV function and wall motion, ef 74%   COLONOSCOPY  01/19/2013   COLOSTOMY TAKEDOWN  07-22-2002   CYSTOSCOPY WITH RETROGRADE PYELOGRAM, URETEROSCOPY AND STENT PLACEMENT Left 07/28/2015   Procedure: CYSTOSCOPY WITH LEFT RETROGRADE PYELOGRAM, POSSIBLE LEFT URETEROSCOPY AND STENT PLACEMENT;  Surgeon: Franchot Gallo, MD;  Location: [facility];  Service: Urology;  Laterality: Left;   EXPLORATORY LAPARTOMY SIGMOID COLECTOMY/ COLOSTOMY  04-28-2002   perforated diverticulitis   KYPHOPLASTY     LAPAROSCOPY VENTRAL HERNIA REPAIR/  EXTENSIVE LYSIS ADHESIONS  05-04-2004   PACEMAKER GENERATOR CHANGE Left 09/16/2011   MDT ADDRL1 pacemaker  PACEMAKER PLACEMENT  05-11-2003     medtronic   ROBOT ASSISTED LAPAROSCOPIC NEPHRECTOMY Left 03/20/2016   Procedure: XI ROBOTIC ASSISTED LAPAROSCOPIC RETROPERITONEAL NEPHRECTOMY;  Surgeon: Alexis Frock, MD;  Location: [facility];  Service: Urology;  Laterality: Left;   THYROIDECTOMY N/A 08/07/2012   Procedure: THYROIDECTOMY;  Surgeon: Earnstine Regal, MD;  Location: [facility];  Service: General;  Laterality: N/A;   TRANSTHORACIC ECHOCARDIOGRAM  10-12-2014   moderate focal basal LVH,  ef 55-60%,  grade 2 diastolic dysfunction, mild AV calcification without stenosis,  mild MR,  trivial PR    Family History  Problem Relation Age of Onset   Heart disease Mother         Stent in her 34s   Stroke Mother    Hypertension Mother    Heart failure Father    Hypertension Brother    Heart disease Brother    Diverticulosis Sister    Hypertension Brother    Cancer Son        breast   Heart attack Neg Hx     Social History   Socioeconomic History   Marital status: Single    Spouse name: Not on file   Number of children: 2   Years of education: 16   Highest education level: Not on file  Occupational History   Occupation: Retired Production assistant, radio for the [employer]    Employer: RETIRED  Tobacco Use   Smoking status: Former    Years: 15.00    Types: Cigarettes    Quit date: 10/06/1979    Years since quitting: 41.6   Smokeless tobacco: Never  Substance and Sexual Activity   Alcohol use: No   Drug use: No   Sexual activity: Not on file  Other Topics Concern   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  She was married for 22 years, divorced, and has remained single. She has 1 grown son, 1 grown daughter.  She has 3 grandchildren, 2 of her children   live in [place].  She is retired.  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 She had a long term companion, 32 years, who passed away 09-23-2010.   Social Determinants of Health   Financial Resource Strain: Low Risk    Difficulty of Paying Living Expenses: Not hard at all  Food Insecurity: No Food Insecurity   Worried About Charity fundraiser in the Last Year: Never true   [place] in the Last Year: Never true  Transportation Needs: No Transportation Needs   Lack of Transportation (Medical): No   Lack of Transportation (Non-Medical): No  Physical Activity: Insufficiently Active   Days of Exercise per Week: 4 days   Minutes of Exercise per Session: 30 min  Stress: No Stress Concern Present   Feeling of Stress : Not at all  Social Connections: Socially  Isolated   Frequency of Communication with Friends and Family: More than three times a week   Frequency of Social Gatherings with Friends and Family: More than three times a week   Attends Religious Services: Never   Marine scientist or Organizations: No   Attends Archivist Meetings: Never   Marital Status: Divorced  Human resources officer Violence: Not At Risk   Fear of Current or Ex-Partner: No   Emotionally Abused: No  Physically Abused: No   Sexually Abused: No    Outpatient Medications Prior to Visit  Medication Sig Dispense Refill   acetaminophen (TYLENOL) 500 MG tablet Take 1,000 mg by mouth daily as needed for moderate pain.     apixaban (ELIQUIS) 5 MG TABS tablet Take 1 tablet (5 mg total) by mouth 2 (two) times daily. 180 tablet 1   denosumab (PROLIA) 60 MG/ML SOSY injection Inject 60 mg into the skin every 6 (six) months.     diclofenac Sodium (VOLTAREN) 1 % GEL APPLY 4 GRAMS ON TO THE SKIN 4 (FOUR) TIMES DAILY. 100 g 2   diphenhydramine-acetaminophen (TYLENOL PM) 25-500 MG TABS tablet Take 2 tablets by mouth at bedtime as needed (sleep/pain).     gabapentin (NEURONTIN) 100 MG capsule Take 1 capsule (100 mg total) by mouth 3 (three) times daily. 90 capsule 5   levocetirizine (XYZAL) 5 MG tablet TAKE 1 TABLET (5 MG TOTAL) BY MOUTH EVERY EVENING. 90 tablet 3   lisinopril (ZESTRIL) 40 MG tablet Take 1 tablet by mouth daily. 90 tablet 1   loratadine (CLARITIN) 10 MG tablet TAKE 1 TABLET (10 MG TOTAL) BY MOUTH DAILY AS NEEDED FOR ALLERGIES. 100 tablet 2   metoprolol tartrate (LOPRESSOR) 25 MG tablet Take 1 tablet (25 mg total) by mouth 2 (two) times daily. 180 tablet 1   pantoprazole (PROTONIX) 40 MG tablet Take 1 tablet (40 mg total) by mouth 2 (two) times daily before a meal. 60 tablet 2   amLODipine (NORVASC) 5 MG tablet TAKE 1 TABLET (5 MG TOTAL) BY MOUTH DAILY. 90 tablet 1   levothyroxine (SYNTHROID) 100 MCG tablet TAKE 1 TABLET BY MOUTH ONCE DAILY BEFORE BREAKFAST  30 tablet 0   COVID-19 mRNA vaccine, Pfizer, 30 MCG/0.3ML injection INJECT AS DIRECTED (Patient not taking: No sig reported) .3 mL 0   fluticasone (FLONASE) 50 MCG/ACT nasal spray PLACE 2 SPRAYS INTO BOTH NOSTRILS DAILY AS NEEDED FOR ALLERGIES OR RHINITIS. 16 g 2   Facility-Administered Medications Prior to Visit  Medication Dose Route Frequency Provider Last Rate Last Admin   aminophylline injection 150 mg  150 mg Intravenous BID PRN Dorothy Spark, MD   150 mg at 04/20/15 1200    Allergies  Allergen Reactions   Tape Rash   Codeine Nausea And Vomiting    Review of Systems  Constitutional:  Negative for fever and malaise/fatigue.  HENT:  Negative for congestion.   Eyes:  Negative for blurred vision.  Respiratory:  Negative for shortness of breath.   Cardiovascular:  Negative for chest pain, palpitations and leg swelling.  Gastrointestinal:  Negative for abdominal pain, blood in stool and nausea.  Genitourinary:  Negative for dysuria and frequency.  Musculoskeletal:  Positive for myalgias (Left arm pain). Negative for falls.  Skin:  Negative for rash.       (+)hair loss on scalp (+)lipoma on left upper extremity  Neurological:  Negative for dizziness, loss of consciousness and headaches.  Endo/Heme/Allergies:  Negative for environmental allergies.  Psychiatric/Behavioral:  Negative for depression. The patient is not nervous/anxious.       Objective:    Physical Exam Vitals and nursing note reviewed.  Constitutional:      General: She is not in acute distress.    Appearance: Normal appearance. She is not ill-appearing.  HENT:     Head: Normocephalic and atraumatic.     Right Ear: External ear normal.     Left Ear: External ear normal.  Eyes:  Extraocular Movements: Extraocular movements intact.     Pupils: Pupils are equal, round, and reactive to light.  Cardiovascular:     Rate and Rhythm: Normal rate and regular rhythm.     Heart sounds: Normal heart sounds. No  murmur heard.   No gallop.  Pulmonary:     Effort: Pulmonary effort is normal. No respiratory distress.     Breath sounds: Normal breath sounds. No wheezing or rales.  Skin:    General: Skin is warm and dry.          Comments: Lipoma upper L arm---  3 in diameter  Causing pain in arm  Neurological:     Mental Status: She is alert and oriented to person, place, and time.  Psychiatric:        Behavior: Behavior normal.        Judgment: Judgment normal.    BP 132/84 (BP Location: Left Arm, Patient Position: Sitting, Cuff Size: Normal)   Pulse 67   Temp 97.8 F (36.6 C) (Oral)   Resp 18   Ht '5\' 3"'  (1.6 m)   Wt 153 lb (69.4 kg)   SpO2 98%   BMI 27.10 kg/m  Wt Readings from Last 3 Encounters:  05/08/21 153 lb (69.4 kg)  03/21/21 151 lb (68.5 kg)  12/11/20 151 lb 12.8 oz (68.9 kg)    Diabetic Foot Exam - Simple   No data filed    Lab Results  Component Value Date   WBC 4.5 09/16/2020   HGB 11.2 (L) 09/16/2020   HCT 33.4 (L) 09/16/2020   PLT 129 (L) 09/16/2020   GLUCOSE 88 10/16/2020   CHOL 175 07/12/2019   TRIG 98.0 07/12/2019   HDL 49.90 07/12/2019   LDLCALC 106 (H) 07/12/2019   ALT 15 07/12/2019   AST 24 07/12/2019   NA 136 10/16/2020   K 4.8 10/16/2020   CL 101 10/16/2020   CREATININE 1.03 10/16/2020   BUN 25 (H) 10/16/2020   CO2 29 10/16/2020   TSH 1.33 12/20/2020   INR 1.01 11/04/2013   HGBA1C 5.5 06/07/2016    Lab Results  Component Value Date   TSH 1.33 12/20/2020   Lab Results  Component Value Date   WBC 4.5 09/16/2020   HGB 11.2 (L) 09/16/2020   HCT 33.4 (L) 09/16/2020   MCV 83.5 09/16/2020   PLT 129 (L) 09/16/2020   Lab Results  Component Value Date   NA 136 10/16/2020   K 4.8 10/16/2020   CO2 29 10/16/2020   GLUCOSE 88 10/16/2020   BUN 25 (H) 10/16/2020   CREATININE 1.03 10/16/2020   BILITOT 0.6 07/12/2019   ALKPHOS 48 07/12/2019   AST 24 07/12/2019   ALT 15 07/12/2019   PROT 6.4 12/11/2020   ALBUMIN 4.5 07/12/2019   CALCIUM  9.4 10/16/2020   ANIONGAP 7 09/17/2020   EGFR 49 (L) 09/29/2020   GFR 50.74 (L) 10/16/2020   Lab Results  Component Value Date   CHOL 175 07/12/2019   Lab Results  Component Value Date   HDL 49.90 07/12/2019   Lab Results  Component Value Date   LDLCALC 106 (H) 07/12/2019   Lab Results  Component Value Date   TRIG 98.0 07/12/2019   Lab Results  Component Value Date   CHOLHDL 4 07/12/2019   Lab Results  Component Value Date   HGBA1C 5.5 06/07/2016       Assessment & Plan:   Problem List Items Addressed This Visit   None Visit  Diagnoses     Primary hypertension    -  Primary   Relevant Medications   doxazosin (CARDURA) 1 MG tablet   Hypothyroidism, unspecified type       Relevant Medications   levothyroxine (SYNTHROID) 100 MCG tablet   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    Dispense:  90 tablet    Refill:  3   doxazosin (CARDURA) 1 MG tablet    Sig: Take 1 tablet (1 mg total) by mouth daily.    Dispense:  30 tablet    Refill:  1    I, Dr. Roma Schanz, DO, personally preformed the services described in this documentation.  All medical record entries made by the scribe were at my direction and in my presence.  I have reviewed the chart and discharge instructions (if applicable) and agree that the record reflects my personal performance and is accurate and complete. 05/08/2021   I,[name],acting as a scribe for [name], DO.,have documented all relevant documentation on the behalf of [name], DO,as directed by  [name], DO while in the presence of [name], DO.   [name], DO

## 2021-05-08 NOTE — Patient Instructions (Addendum)
DASH Eating Plan DASH stands for Dietary Approaches to Stop Hypertension. The DASH eating plan is a healthy eating plan that has been shown to: Reduce high blood pressure (hypertension). Reduce your risk for type 2 diabetes, heart disease, and stroke. Help with weight loss. What are tips for following this plan? Reading food labels Shopping Buy products labeled as "low-sodium" or "no salt added." Buy fresh foods. Avoid canned foods and pre-made or frozen meals. Cooking Avoid adding salt when cooking. Use salt-free seasonings or herbs instead of table salt or sea salt. Check with your health care provider or pharmacist before using salt substitutes. Do not fry foods. Cook foods using healthy methods such as baking, boiling, grilling, roasting, and broiling instead. Cook with heart-healthy oils, such as olive, canola, avocado, soybean, or sunflower oil. Meal planning  Eat a balanced diet that includes: 4 or more servings of fruits and 4 or more servings of vegetables each day. Try to fill one-half of your plate with fruits and vegetables. 6-8 servings of whole grains each day. Less than 6 oz (170 g) of lean meat, poultry, or fish each day. A 3-oz (85-g) serving of meat is about the same size as a deck of cards. One egg equals 1 oz (28 g). 2-3 servings of low-fat dairy each day. One serving is 1 cup (237 mL). 1 serving of nuts, seeds, or beans 5 times each week. 2-3 servings of heart-healthy fats. Healthy fats called omega-3 fatty acids are found in foods such as walnuts, flaxseeds, fortified milks, and eggs. These fats are also found in cold-water fish, such as sardines, salmon, and mackerel. Limit how much you eat of: Canned or prepackaged foods. Food that is high in trans fat, such as some fried foods. Food that is high in saturated fat, such as fatty meat. Desserts and other sweets, sugary drinks, and other foods with added sugar. Full-fat dairy products. Do not salt foods before  eating. Do not eat more than 4 egg yolks a week. Try to eat at least 2 vegetarian meals a week. Eat more home-cooked food and less restaurant, buffet, and fast food. Lifestyle When eating at a restaurant, ask that your food be prepared with less salt or no salt, if possible. If you drink alcohol: Limit how much you use to: 0-1 drink a day for women who are not pregnant. 0-2 drinks a day for men. Be aware of how much alcohol is in your drink. In the [place] one drink equals one 12 oz bottle of beer (355 mL), one 5 oz glass of wine (148 mL), or one 1 oz glass of hard liquor (44 mL). General information Avoid eating more than 2,300 mg of salt a day. If you have hypertension, you may need to reduce your sodium intake to 1,500 mg a day. Work with your health care provider to maintain a healthy body weight or to lose weight. Ask what an ideal weight is for you. Get at least 30 minutes of exercise that causes your heart to beat faster (aerobic exercise) most days of the week. Activities may include walking, swimming, or biking. Work with your health care provider or dietitian to adjust your eating plan to your individual calorie needs. What foods should I eat? Fruits All fresh, dried, or frozen fruit. Canned fruit in natural juice (without added sugar). Vegetables Fresh or frozen vegetables (raw, steamed, roasted, or grilled). Low-sodium or reduced-sodium tomato and vegetable juice. Low-sodium or reduced-sodium tomato sauce and tomato paste. Low-sodium or reduced-sodium canned  vegetables. Grains Whole-grain or whole-wheat bread. Whole-grain or whole-wheat pasta. Brown rice. [place]. Bulgur. Whole-grain and low-sodium cereals. Pita bread. Low-fat, low-sodium crackers. Whole-wheat flour tortillas. Meats and other proteins Skinless chicken or [place]. Ground chicken or [place]. Pork with fat trimmed off. Fish and seafood. Egg whites. Dried beans, peas, or lentils. Unsalted nuts, nut butters, and  seeds. Unsalted canned beans. Lean cuts of beef with fat trimmed off. Low-sodium, lean precooked or cured meat, such as sausages or meat loaves. Dairy Low-fat (1%) or fat-free (skim) milk. Reduced-fat, low-fat, or fat-free cheeses. Nonfat, low-sodium ricotta or cottage cheese. Low-fat or nonfat yogurt. Low-fat, low-sodium cheese. Fats and oils Soft margarine without trans fats. Vegetable oil. Reduced-fat, low-fat, or light mayonnaise and salad dressings (reduced-sodium). Canola, safflower, olive, avocado, soybean, and sunflower oils. Avocado. Seasonings and condiments Herbs. Spices. Seasoning mixes without salt. Other foods Unsalted popcorn and pretzels. Fat-free sweets. The items listed above may not be a complete list of foods and beverages you can eat. Contact a dietitian for more information. What foods should I avoid? Fruits Canned fruit in a light or heavy syrup. Fried fruit. Fruit in cream or butter sauce. Vegetables Creamed or fried vegetables. Vegetables in a cheese sauce. Regular canned vegetables (not low-sodium or reduced-sodium). Regular canned tomato sauce and paste (not low-sodium or reduced-sodium). Regular tomato and vegetable juice (not low-sodium or reduced-sodium). Angie Fava. Olives. Grains Baked goods made with fat, such as croissants, muffins, or some breads. Dry pasta or rice meal packs. Meats and other proteins Fatty cuts of meat. Ribs. Fried meat. Berniece Salines. Bologna, salami, and other precooked or cured meats, such as sausages or meat loaves. Fat from the back of a pig (fatback). Bratwurst. Salted nuts and seeds. Canned beans with added salt. Canned or smoked fish. Whole eggs or egg yolks. Chicken or [place] with skin. Dairy Whole or 2% milk, cream, and half-and-half. Whole or full-fat cream cheese. Whole-fat or sweetened yogurt. Full-fat cheese. Nondairy creamers. Whipped toppings. Processed cheese and cheese spreads. Fats and oils Butter. Stick margarine. Lard. Shortening.  Ghee. Bacon fat. Tropical oils, such as coconut, palm kernel, or palm oil. Seasonings and condiments Onion salt, garlic salt, seasoned salt, table salt, and sea salt. Worcestershire sauce. Tartar sauce. Barbecue sauce. Teriyaki sauce. Soy sauce, including reduced-sodium. Steak sauce. Canned and packaged gravies. Fish sauce. Oyster sauce. Cocktail sauce. Store-bought horseradish. Ketchup. Mustard. Meat flavorings and tenderizers. Bouillon cubes. Hot sauces. Pre-made or packaged marinades. Pre-made or packaged taco seasonings. Relishes. Regular salad dressings. Other foods Salted popcorn and pretzels. The items listed above may not be a complete list of foods and beverages you should avoid. Contact a dietitian for more information. Where to find more information National Heart, Lung, and Blood Institute: https://wilson-eaton.com/ American Heart Association: www.heart.org Academy of Nutrition and Dietetics: www.eatright.[place]: www.kidney.org Summary The DASH eating plan is a healthy eating plan that has been shown to reduce high blood pressure (hypertension). It may also reduce your risk for type 2 diabetes, heart disease, and stroke. When on the DASH eating plan, aim to eat more fresh fruits and vegetables, whole grains, lean proteins, low-fat dairy, and heart-healthy fats. With the DASH eating plan, you should limit salt (sodium) intake to 2,300 mg a day. If you have hypertension, you may need to reduce your sodium intake to 1,500 mg a day. Work with your health care provider or dietitian to adjust your eating plan to your individual calorie needs. This information is not intended to replace advice given to  you by your health care provider. Make sure you discuss any questions you have with your health care provider. Document Revised: 05/21/2019 Document Reviewed: 05/21/2019 Elsevier Patient Education  2022 [employer].

## 2021-05-09 ENCOUNTER — Ambulatory Visit (INDEPENDENT_AMBULATORY_CARE_PROVIDER_SITE_OTHER): Payer: Medicare Other

## 2021-05-09 DIAGNOSIS — I442 Atrioventricular block, complete: Secondary | ICD-10-CM

## 2021-05-10 LAB — CUP PACEART REMOTE DEVICE CHECK
Battery Impedance: 2000 Ohm
Battery Remaining Longevity: 31 mo
Battery Voltage: 2.73 V
Brady Statistic AP VP Percent: 17 %
Brady Statistic AP VS Percent: 0 %
Brady Statistic AS VP Percent: 82 %
Brady Statistic AS VS Percent: 0 %
Date Time Interrogation Session: 20221110114438
Implantable Lead Implant Date: 20041110
Implantable Lead Implant Date: 20041110
Implantable Lead Location: 753859
Implantable Lead Location: 753860
Implantable Pulse Generator Implant Date: 20130318
Lead Channel Impedance Value: 392 Ohm
Lead Channel Impedance Value: 518 Ohm
Lead Channel Pacing Threshold Amplitude: 0.5 V
Lead Channel Pacing Threshold Amplitude: 0.625 V
Lead Channel Pacing Threshold Pulse Width: 0.4 ms
Lead Channel Pacing Threshold Pulse Width: 0.4 ms
Lead Channel Setting Pacing Amplitude: 2 V
Lead Channel Setting Pacing Amplitude: 2.5 V
Lead Channel Setting Pacing Pulse Width: 0.46 ms
Lead Channel Setting Sensing Sensitivity: 4 mV

## 2021-05-10 NOTE — Telephone Encounter (Signed)
Prolia VOB initiated via [email or web address]  Last OV:  Next OV:  Last Prolia inj: 12/20/20 Next Prolia inj DUE: 06/22/21

## 2021-05-16 NOTE — Progress Notes (Signed)
Remote pacemaker transmission.   

## 2021-06-02 NOTE — Telephone Encounter (Signed)
Pt ready for scheduling on or after 06/22/21  Out-of-pocket cost due at time of visit: $0.00  Primary: Medicare Prolia co-insurance: 20% (approximately $270) Admin fee co-insurance: 20% (approximately $25)  Secondary: [employer] Prolia co-insurance: Covers Medicare Part B co-insurance and deductible.  Admin fee co-insurance: Covers Medicare Part B co-insurance and deductible.   Deductible: $233 of $233 met  Prior Auth: not required PA# Valid:   ** This summary of benefits is an estimation of the patient's out-of-pocket cost. Exact cost may vary based on individual plan coverage.

## 2021-06-04 ENCOUNTER — Telehealth (HOSPITAL_BASED_OUTPATIENT_CLINIC_OR_DEPARTMENT_OTHER): Payer: Self-pay

## 2021-06-05 ENCOUNTER — Ambulatory Visit (HOSPITAL_BASED_OUTPATIENT_CLINIC_OR_DEPARTMENT_OTHER): Payer: Medicare Other

## 2021-06-06 ENCOUNTER — Ambulatory Visit (INDEPENDENT_AMBULATORY_CARE_PROVIDER_SITE_OTHER): Payer: Medicare Other | Admitting: Family

## 2021-06-06 ENCOUNTER — Emergency Department (HOSPITAL_BASED_OUTPATIENT_CLINIC_OR_DEPARTMENT_OTHER): Payer: Medicare Other

## 2021-06-06 ENCOUNTER — Encounter (HOSPITAL_BASED_OUTPATIENT_CLINIC_OR_DEPARTMENT_OTHER): Payer: Self-pay

## 2021-06-06 ENCOUNTER — Other Ambulatory Visit: Payer: Self-pay

## 2021-06-06 ENCOUNTER — Emergency Department (HOSPITAL_BASED_OUTPATIENT_CLINIC_OR_DEPARTMENT_OTHER)
Admission: EM | Admit: 2021-06-06 | Discharge: 2021-06-06 | Disposition: A | Discharge status: Home / Self Care | Payer: Medicare Other | Attending: Emergency Medicine | Admitting: Emergency Medicine

## 2021-06-06 VITALS — BP 206/84 | HR 81 | Temp 97.9°F | Resp 16 | Ht 63.0 in | Wt 153.0 lb

## 2021-06-06 DIAGNOSIS — R109 Unspecified abdominal pain: Secondary | ICD-10-CM | POA: Insufficient documentation

## 2021-06-06 DIAGNOSIS — Z85850 Personal history of malignant neoplasm of thyroid: Secondary | ICD-10-CM | POA: Insufficient documentation

## 2021-06-06 DIAGNOSIS — R0602 Shortness of breath: Secondary | ICD-10-CM | POA: Diagnosis not present

## 2021-06-06 DIAGNOSIS — Z7901 Long term (current) use of anticoagulants: Secondary | ICD-10-CM | POA: Insufficient documentation

## 2021-06-06 DIAGNOSIS — Z20822 Contact with and (suspected) exposure to covid-19: Secondary | ICD-10-CM | POA: Insufficient documentation

## 2021-06-06 DIAGNOSIS — Z950 Presence of cardiac pacemaker: Secondary | ICD-10-CM | POA: Insufficient documentation

## 2021-06-06 DIAGNOSIS — Z79899 Other long term (current) drug therapy: Secondary | ICD-10-CM | POA: Diagnosis not present

## 2021-06-06 DIAGNOSIS — I10 Essential (primary) hypertension: Secondary | ICD-10-CM | POA: Diagnosis not present

## 2021-06-06 DIAGNOSIS — Z87891 Personal history of nicotine dependence: Secondary | ICD-10-CM | POA: Diagnosis not present

## 2021-06-06 DIAGNOSIS — R072 Precordial pain: Secondary | ICD-10-CM | POA: Diagnosis not present

## 2021-06-06 DIAGNOSIS — R011 Cardiac murmur, unspecified: Secondary | ICD-10-CM | POA: Diagnosis not present

## 2021-06-06 DIAGNOSIS — K219 Gastro-esophageal reflux disease without esophagitis: Secondary | ICD-10-CM | POA: Diagnosis not present

## 2021-06-06 DIAGNOSIS — K5900 Constipation, unspecified: Secondary | ICD-10-CM | POA: Diagnosis not present

## 2021-06-06 DIAGNOSIS — R079 Chest pain, unspecified: Secondary | ICD-10-CM

## 2021-06-06 DIAGNOSIS — E039 Hypothyroidism, unspecified: Secondary | ICD-10-CM | POA: Insufficient documentation

## 2021-06-06 LAB — URINALYSIS, ROUTINE W REFLEX MICROSCOPIC
Bilirubin Urine: NEGATIVE
Glucose, UA: NEGATIVE mg/dL
Ketones, ur: NEGATIVE mg/dL
Leukocytes,Ua: NEGATIVE
Nitrite: NEGATIVE
Protein, ur: NEGATIVE mg/dL
Specific Gravity, Urine: <=1.005 (ref 1.005–1.030)
pH: 5 (ref 5.0–8.0)

## 2021-06-06 LAB — TROPONIN I (HIGH SENSITIVITY): Troponin I (High Sensitivity): 8 ng/L (ref <18)

## 2021-06-06 LAB — COMPREHENSIVE METABOLIC PANEL
ALT: 14 U/L (ref 0–44)
AST: 22 U/L (ref 15–41)
Albumin: 4.3 g/dL (ref 3.5–5.0)
Alkaline Phosphatase: 52 U/L (ref 38–126)
Anion gap: 11 (ref 5–15)
BUN: 18 mg/dL (ref 8–23)
CO2: 24 mmol/L (ref 22–32)
Calcium: 9.1 mg/dL (ref 8.9–10.3)
Chloride: 104 mmol/L (ref 98–111)
Creatinine, Ser: 0.96 mg/dL (ref 0.44–1.00)
GFR, Estimated: 59 mL/min — ABNORMAL LOW (ref >60)
Glucose, Bld: 97 mg/dL (ref 70–99)
Potassium: 3.7 mmol/L (ref 3.5–5.1)
Sodium: 139 mmol/L (ref 135–145)
Total Bilirubin: 0.7 mg/dL (ref 0.3–1.2)
Total Protein: 7 g/dL (ref 6.5–8.1)

## 2021-06-06 LAB — CBC WITH DIFFERENTIAL/PLATELET
Abs Immature Granulocytes: 0.01 10*3/uL (ref 0.00–0.07)
Basophils Absolute: 0 10*3/uL (ref 0.0–0.1)
Basophils Relative: 1 %
Eosinophils Absolute: 0.1 10*3/uL (ref 0.0–0.5)
Eosinophils Relative: 1 %
HCT: 38.1 % (ref 36.0–46.0)
Hemoglobin: 12.4 g/dL (ref 12.0–15.0)
Immature Granulocytes: 0 %
Lymphocytes Relative: 18 %
Lymphs Abs: 0.8 10*3/uL (ref 0.7–4.0)
MCH: 27.5 pg (ref 26.0–34.0)
MCHC: 32.5 g/dL (ref 30.0–36.0)
MCV: 84.5 fL (ref 80.0–100.0)
Monocytes Absolute: 0.5 10*3/uL (ref 0.1–1.0)
Monocytes Relative: 11 %
Neutro Abs: 3 10*3/uL (ref 1.7–7.7)
Neutrophils Relative %: 69 %
Platelets: 132 10*3/uL — ABNORMAL LOW (ref 150–400)
RBC: 4.51 MIL/uL (ref 3.87–5.11)
RDW: 12.7 % (ref 11.5–15.5)
WBC: 4.3 10*3/uL (ref 4.0–10.5)
nRBC: 0 % (ref 0.0–0.2)

## 2021-06-06 LAB — URINALYSIS, MICROSCOPIC (REFLEX)

## 2021-06-06 LAB — LIPASE, BLOOD: Lipase: 36 U/L (ref 11–51)

## 2021-06-06 LAB — BRAIN NATRIURETIC PEPTIDE: B Natriuretic Peptide: 479.9 pg/mL — ABNORMAL HIGH (ref 0.0–100.0)

## 2021-06-06 LAB — RESP PANEL BY RT-PCR (FLU A&B, COVID) ARPGX2
Influenza A by PCR: NEGATIVE
Influenza B by PCR: NEGATIVE
SARS Coronavirus 2 by RT PCR: NEGATIVE

## 2021-06-06 MED ORDER — IOHEXOL 350 MG/ML SOLN: 100.0000 mL | Freq: Once | INTRAVENOUS | Status: DC | PRN

## 2021-06-06 NOTE — ED Provider Notes (Signed)
[facility] EMERGENCY DEPARTMENT Provider Note   CSN: [id number] Arrival date & time: 06/06/21  0946     History Chief Complaint  Patient presents with   Chest Pain    [name] is a 80 y.o. female.  She has a history of paroxysmal A. fib on anticoagulation, AV block status post pacemaker, hiatal hernia, renal cell cancer status post nephrectomy and CKD.  She is complaining of 3 weeks of some lower chest upper abdominal pressure that she thinks is reflux.  Associated with shortness of breath and dyspnea on exertion.  She has had some cough its been nonproductive.  Head congestion.  She also has not moved her bowels in 3 weeks.  Been trying some over-the-counter fiber without any improvement.  She said a good appetite.  No dysuria.  Saw her PCP today who referred her down to the emergency department for further evaluation.  The history is provided by the patient.  Chest Pain Pain location:  Substernal area Pain quality: pressure   Pain radiates to:  Epigastrium Pain severity:  Mild Onset quality:  Gradual Duration:  3 weeks Timing:  Intermittent Progression:  Unchanged Chronicity:  New Worsened by:  Nothing Ineffective treatments:  None tried Associated symptoms: abdominal pain, cough, heartburn and shortness of breath   Associated symptoms: no back pain, no diaphoresis, no fever, no headache and no vomiting   Shortness of breath:    Severity:  Moderate   Onset quality:  Gradual   Duration:  3 weeks   Timing:  Intermittent   Progression:  Unchanged Risk factors: hypertension       Past Medical History:  Diagnosis Date   Atrioventricular block, complete ([place])    a. 08/2011 Upgrade of PPM to MDT Adapta L Dual Chamber PPM ser # [id number].   Cardiac pacemaker in situ    Contact dermatitis and eczema    unspec cause   Dairy product intolerance    Depression    Diastolic dysfunction    GERD (gastroesophageal reflux disease)    Hiatal hernia    History of  diverticulitis of colon    2003  perforated diverticulitis w/ surgical intervention   History of kidney stones    age 45   History of peripheral edema    lower extremities   History of thyroid nodule    multinodular goiter  s/p  total thyroidectomy 2014   HTN (hypertension)    Hydronephrosis, left    Hypothyroidism    Mild sleep apnea    per study 04-14-2014   Nocturia    NSVT (nonsustained ventricular tachycardia)    Osteoporosis    PAF (paroxysmal atrial fibrillation) (H[facility])    PONV (postoperative nausea and vomiting)    Pulmonary nodules    stable per last ct   Renal cell carcinoma of left kidney ([place])    Superficial thrombophlebitis     Patient Active Problem List   Diagnosis Date Noted   Hypokalemia 10/02/2020   Neuropathy 10/02/2020   Elevated serum creatinine 10/02/2020   AKI (acute kidney injury) ([place]) 09/15/2020   Left-sided chest pain    Congestion of nasal sinus    Substernal chest pain 09/13/2020   Spider veins 04/05/2020   Varicose veins of bilateral lower extremities with other complications 07/03/7251   H/O seasonal allergies 04/05/2020   Numbness and tingling of both feet 12/23/2019   Paroxysmal atrial fibrillation ([place]) 07/22/2019   V-tach 07/22/2019   Lipoma of left upper extremity 03/11/2019  Hordeolum externum of left upper eyelid 02/15/2019   Hyponatremia 02/15/2019   Hyperlipidemia 06/03/2017   Renal mass 03/20/2016   Fecal impaction ([place]) 10/16/2013   Vertebral compression fracture ([facility]) 10/16/2013   Constipation 10/15/2013   Abdominal pain 10/15/2013   PUD (peptic ulcer disease) 10/15/2013   Low back pain with radiation 10/15/2013   Hypothyroidism, 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   Pacemaker-Medtronic 02/01/2011   AV BLOCK, COMPLETE 11/23/2009   LIPOMAS, MULTIPLE 11/30/2008   GANGLION OF TENDON SHEATH 11/30/2008   SUPERFICIAL THROMBOPHLEBITIS 10/10/2008   LUNG NODULE 10/10/2008    HEADACHE, TENSION 05/02/2008   GOITER, MULTINODULAR 41/32/4401   DIASTOLIC DYSFUNCTION 02/72/5366   DEPRESSION 01/23/2007   Essential hypertension 01/23/2007   GERD 01/23/2007   Osteoporosis 01/23/2007   Personal history of other endocrine, metabolic, and immunity disorders 01/23/2007    Past Surgical History:  Procedure Laterality Date   CARDIOVASCULAR STRESS TEST  04-20-2015  dr Caryl Comes   normal nuclear study/  no ischemia/  normal LV function and wall motion, ef 74%   COLONOSCOPY  01/19/2013   COLOSTOMY TAKEDOWN  07-22-2002   CYSTOSCOPY WITH RETROGRADE PYELOGRAM, URETEROSCOPY AND STENT PLACEMENT Left 07/28/2015   Procedure: CYSTOSCOPY WITH LEFT RETROGRADE PYELOGRAM, POSSIBLE LEFT URETEROSCOPY AND STENT PLACEMENT;  Surgeon: Franchot Gallo, MD;  Location: [facility];  Service: Urology;  Laterality: Left;   EXPLORATORY LAPARTOMY SIGMOID COLECTOMY/ COLOSTOMY  04-28-2002   perforated diverticulitis   KYPHOPLASTY     LAPAROSCOPY VENTRAL HERNIA REPAIR/  EXTENSIVE LYSIS ADHESIONS  05-04-2004   PACEMAKER GENERATOR CHANGE Left 09/16/2011   MDT ADDRL1 pacemaker   PACEMAKER PLACEMENT  05-11-2003     medtronic   ROBOT ASSISTED LAPAROSCOPIC NEPHRECTOMY Left 03/20/2016   Procedure: XI ROBOTIC ASSISTED LAPAROSCOPIC RETROPERITONEAL NEPHRECTOMY;  Surgeon: Alexis Frock, MD;  Location: [facility];  Service: Urology;  Laterality: Left;   THYROIDECTOMY N/A 08/07/2012   Procedure: THYROIDECTOMY;  Surgeon: Earnstine Regal, MD;  Location: [facility];  Service: General;  Laterality: N/A;   TRANSTHORACIC ECHOCARDIOGRAM  10-12-2014   moderate focal basal LVH,  ef 55-60%,  grade 2 diastolic dysfunction, mild AV calcification without stenosis,  mild MR,  trivial PR     OB History   No obstetric history on file.     Family History  Problem Relation Age of Onset   Heart disease Mother        Stent in her 54s   Stroke Mother    Hypertension Mother    Heart failure Father    Hypertension Brother     Heart disease Brother    Diverticulosis Sister    Hypertension Brother    Cancer Son        breast   Heart attack Neg Hx     Social History   Tobacco Use   Smoking status: Former    Years: 15.00    Types: Cigarettes    Quit date: 10/06/1979    Years since quitting: 41.6   Smokeless tobacco: Never  Vaping Use   Vaping Use: Never used  Substance Use Topics   Alcohol use: No   Drug use: No    Home Medications Prior to Admission medications   Medication Sig Start Date End Date Taking? Authorizing Provider  acetaminophen (TYLENOL) 500 MG tablet Take 1,000 mg by mouth daily as needed for moderate pain.    [provider]  apixaban (ELIQUIS) 5 MG TABS tablet Take 1 tablet (5 mg total)  by mouth 2 (two) times daily. 06/28/20   Deboraha Sprang, MD  denosumab (PROLIA) 60 MG/ML SOSY injection Inject 60 mg into the skin every 6 (six) months.    [provider]  diclofenac Sodium (VOLTAREN) 1 % GEL APPLY 4 GRAMS ON TO THE SKIN 4 (FOUR) TIMES DAILY. 12/18/20 12/18/21  [name], DO  diphenhydramine-acetaminophen (TYLENOL PM) 25-500 MG TABS tablet Take 2 tablets by mouth at bedtime as needed (sleep/pain).    [provider]  doxazosin (CARDURA) 1 MG tablet Take 1 tablet (1 mg total) by mouth daily. 05/08/21   Carollee Herter, Kendrick Fries R, DO  fluticasone (FLONASE) 50 MCG/ACT nasal spray PLACE 2 SPRAYS INTO BOTH NOSTRILS DAILY AS NEEDED FOR ALLERGIES OR RHINITIS. 02/10/20 02/09/21  Carollee Herter, Alferd Apa, DO  gabapentin (NEURONTIN) 100 MG capsule Take 1 capsule (100 mg total) by mouth 3 (three) times daily. 12/11/20   Pieter Partridge, DO  levocetirizine (XYZAL) 5 MG tablet TAKE 1 TABLET (5 MG TOTAL) BY MOUTH EVERY EVENING. 05/19/20 07/19/21  Carollee Herter, Alferd Apa, DO  levothyroxine (SYNTHROID) 100 MCG tablet Take 1 tablet (100 mcg total) by mouth daily before breakfast. 05/08/21   Carollee Herter, Alferd Apa, DO  lisinopril (ZESTRIL) 40 MG tablet Take 1 tablet by mouth daily.  12/27/20 12/27/21  [name], DO  loratadine (CLARITIN) 10 MG tablet TAKE 1 TABLET (10 MG TOTAL) BY MOUTH DAILY AS NEEDED FOR ALLERGIES. 04/03/20 06/17/21  [name], DO  metoprolol tartrate (LOPRESSOR) 25 MG tablet Take 1 tablet (25 mg total) by mouth 2 (two) times daily. 11/20/20   [name], DO  pantoprazole (PROTONIX) 40 MG tablet Take 1 tablet (40 mg total) by mouth 2 (two) times daily before a meal. 10/23/20   Carollee Herter, Alferd Apa, DO  hydrochlorothiazide (HYDRODIURIL) 12.5 MG tablet TAKE 1 TABLET BY MOUTH ONCE DAILY Patient taking differently: Take 12.5 mg by mouth daily. 07/10/20 09/17/20  [name], DO    Allergies    Tape and Codeine  Review of Systems   Review of Systems  Constitutional:  Negative for diaphoresis and fever.  HENT:  Positive for rhinorrhea.   Eyes:  Negative for visual disturbance.  Respiratory:  Positive for cough and shortness of breath.   Cardiovascular:  Positive for chest pain.  Gastrointestinal:  Positive for abdominal pain, constipation and heartburn. Negative for vomiting.  Genitourinary:  Negative for dysuria.  Musculoskeletal:  Negative for back pain.  Skin:  Negative for rash.  Neurological:  Negative for headaches.   Physical Exam Updated Vital Signs BP (!) 200/90   Pulse 81   Temp 98 F (36.7 C) (Oral)   Resp (!) 21   Ht 5\' 3"  (1.6 m)   Wt 69.4 kg   SpO2 100%   BMI 27.10 kg/m   Physical Exam Vitals and nursing note reviewed.  Constitutional:      General: She is not in acute distress.    Appearance: She is well-developed.  HENT:     Head: Normocephalic and atraumatic.  Eyes:     Conjunctiva/sclera: Conjunctivae normal.  Cardiovascular:     Rate and Rhythm: Normal rate and regular rhythm.     Heart sounds: Murmur heard.  Systolic murmur is present.  Pulmonary:     Effort: Pulmonary effort is normal. No respiratory distress.     Breath sounds: Normal breath sounds.     Comments:  Pacemaker left upper chest nontender  Abdominal:     Palpations: Abdomen is soft. There is no mass.     Tenderness: There is no abdominal tenderness. There is no guarding or rebound.  Musculoskeletal:        General: No swelling.     Cervical back: Neck supple.     Right lower leg: No tenderness. No edema.     Left lower leg: No tenderness. No edema.  Skin:    General: Skin is warm and dry.     Capillary Refill: Capillary refill takes less than 2 seconds.  Neurological:     General: No focal deficit present.     Mental Status: She is alert.  Psychiatric:        Mood and Affect: Mood normal.    ED Results / Procedures / Treatments   Labs (all labs ordered are listed, but only abnormal results are displayed) Labs Reviewed  COMPREHENSIVE METABOLIC PANEL - Abnormal; Notable for the following components:      Result Value   GFR, Estimated 59 (*)    All other components within normal limits  CBC WITH DIFFERENTIAL/PLATELET - Abnormal; Notable for the following components:   Platelets 132 (*)    All other components within normal limits  BRAIN NATRIURETIC PEPTIDE - Abnormal; Notable for the following components:   B Natriuretic Peptide 479.9 (*)    All other components within normal limits  URINALYSIS, ROUTINE W REFLEX MICROSCOPIC - Abnormal; Notable for the following components:   Color, Urine STRAW (*)    Hgb urine dipstick TRACE (*)    All other components within normal limits  URINALYSIS, MICROSCOPIC (REFLEX) - Abnormal; Notable for the following components:   Bacteria, UA RARE (*)    All other components within normal limits  RESP PANEL BY RT-PCR (FLU A&B, COVID) ARPGX2  LIPASE, BLOOD  TROPONIN I (HIGH SENSITIVITY)    EKG EKG Interpretation  Date/Time:  Wednesday June 06 2021 09:55:36 EST Ventricular Rate:  83 PR Interval:  119 QRS Duration: 164 QT Interval:  445 QTC Calculation: 523 R Axis:   -86 Text Interpretation: VENTRICULAR PACED RHYTHM No significant  change since prior 3/22 Confirmed by Aletta Edouard [phone]) on 06/06/2021 9:58:18 AM  Radiology CT Abdomen Pelvis Wo Contrast  Result Date: 06/06/2021 CLINICAL DATA:  Abdominal pain, acute nonlocalized abdominal pain with 2 weeks of chest pain abdominal pain and shortness of breath in an 80 year old female. EXAM: CT CHEST, ABDOMEN AND PELVIS WITHOUT CONTRAST TECHNIQUE: Multidetector CT imaging of the chest, abdomen and pelvis was performed following the standard protocol without IV contrast. COMPARISON:  July 12, 2015. FINDINGS: CT CHEST FINDINGS Cardiovascular: Calcified atheromatous plaque in the thoracic aorta. No aneurysmal dilation. Pacer device with power pack over the LEFT chest, leads in the RIGHT heart. Heart size moderately enlarged without substantial pericardial effusion. Normal caliber of the central pulmonary vessels. Limited assessment of cardiovascular structures given lack of intravenous contrast. Mediastinum/Nodes: Mildly patulous esophagus. No thoracic inlet adenopathy. No axillary lymphadenopathy. No mediastinal lymphadenopathy. Lungs/Pleura: Peri fissural intrapulmonary lymph node in the RIGHT mid chest along the minor fissure. Discrete pulmonary nodule on image 104/4) 6 x 5 mm. This is along the lateral pleural surface of the RIGHT middle lobe. This is stable since 2019. Mild scarring in the LEFT chest. Small LEFT-sided pleural effusion. Musculoskeletal: See below for full musculoskeletal details. CT ABDOMEN PELVIS FINDINGS Hepatobiliary: Low attenuation focus in the lateral segment of the LEFT hepatic lobe no focal, suspicious hepatic lesion on noncontrast imaging. Cholelithiasis. No signs  of pericholecystic stranding. Pancreas: Normal contour without signs of inflammation or gross ductal dilation. Spleen: Spleen is enlarged approximately 12 cm greatest craniocaudal dimension which is similar compared to previous imaging. Hypodense splenic lesions are similar as well, largest in the  inferior spleen measuring approximately 3.2 x 3.0 cm greatest axial dimension, stable as measured by this observer on the previous study. Size and number grossly unchanged with respect to these lesions in the spleen. Adrenals/Urinary Tract: Adrenal glands are normal. Smooth renal contour the RIGHT. No hydronephrosis. No nephrolithiasis. No perivesical stranding. Urinary bladder decompressed limiting assessment. Signs of LEFT nephrectomy since more remote imaging. No nodularity in the nephrectomy bed. Stomach/Bowel: Colonic diverticulosis. No signs of diverticulitis. No acute gastrointestinal process. Appendix is normal. Vascular/Lymphatic: Aortic atherosclerosis. No sign of aneurysm. Smooth contour of the IVC. There is no gastrohepatic or hepatoduodenal ligament lymphadenopathy. No retroperitoneal or mesenteric lymphadenopathy. No pelvic sidewall lymphadenopathy. Limited assessment of vascular structures due to lack of intravenous contrast. Reproductive: No adnexal mass. Small uterus with calcified fibroids. Other: Post ventral abdominal wall reconstruction with mesh. Musculoskeletal: Signs of multilevel cement augmentation of prior vertebral fractures throughout the lower thoracic and in the lumbar spine no substantial change from previous imaging. No acute or destructive bone process. Spinal degenerative changes as on previous imaging. IMPRESSION: No acute findings in the chest, abdomen or pelvis. Signs of LEFT nephrectomy since more remote imaging. No signs of nodularity in the nephrectomy bed. Small LEFT-sided pleural effusion. Discrete pulmonary nodule along the lateral pleural surface of the RIGHT middle lobe is stable since 2019. Compatible with benign pulmonary nodule. Splenomegaly with multiple splenic lesions, likely benign given stability over time. Signs of multilevel cement augmentation of prior vertebral fractures throughout the lower thoracic and lumbar spine. Aortic atherosclerosis. Electronically  Signed   By: Zetta Bills M.D.   On: 06/06/2021 14:44   CT Chest Wo Contrast  Result Date: 06/06/2021 CLINICAL DATA:  Abdominal pain, acute nonlocalized abdominal pain with 2 weeks of chest pain abdominal pain and shortness of breath in an 80 year old female. EXAM: CT CHEST, ABDOMEN AND PELVIS WITHOUT CONTRAST TECHNIQUE: Multidetector CT imaging of the chest, abdomen and pelvis was performed following the standard protocol without IV contrast. COMPARISON:  July 12, 2015. FINDINGS: CT CHEST FINDINGS Cardiovascular: Calcified atheromatous plaque in the thoracic aorta. No aneurysmal dilation. Pacer device with power pack over the LEFT chest, leads in the RIGHT heart. Heart size moderately enlarged without substantial pericardial effusion. Normal caliber of the central pulmonary vessels. Limited assessment of cardiovascular structures given lack of intravenous contrast. Mediastinum/Nodes: Mildly patulous esophagus. No thoracic inlet adenopathy. No axillary lymphadenopathy. No mediastinal lymphadenopathy. Lungs/Pleura: Peri fissural intrapulmonary lymph node in the RIGHT mid chest along the minor fissure. Discrete pulmonary nodule on image 104/4) 6 x 5 mm. This is along the lateral pleural surface of the RIGHT middle lobe. This is stable since 2019. Mild scarring in the LEFT chest. Small LEFT-sided pleural effusion. Musculoskeletal: See below for full musculoskeletal details. CT ABDOMEN PELVIS FINDINGS Hepatobiliary: Low attenuation focus in the lateral segment of the LEFT hepatic lobe no focal, suspicious hepatic lesion on noncontrast imaging. Cholelithiasis. No signs of pericholecystic stranding. Pancreas: Normal contour without signs of inflammation or gross ductal dilation. Spleen: Spleen is enlarged approximately 12 cm greatest craniocaudal dimension which is similar compared to previous imaging. Hypodense splenic lesions are similar as well, largest in the inferior spleen measuring approximately 3.2 x 3.0  cm greatest axial dimension, stable as measured by this observer on  the previous study. Size and number grossly unchanged with respect to these lesions in the spleen. Adrenals/Urinary Tract: Adrenal glands are normal. Smooth renal contour the RIGHT. No hydronephrosis. No nephrolithiasis. No perivesical stranding. Urinary bladder decompressed limiting assessment. Signs of LEFT nephrectomy since more remote imaging. No nodularity in the nephrectomy bed. Stomach/Bowel: Colonic diverticulosis. No signs of diverticulitis. No acute gastrointestinal process. Appendix is normal. Vascular/Lymphatic: Aortic atherosclerosis. No sign of aneurysm. Smooth contour of the IVC. There is no gastrohepatic or hepatoduodenal ligament lymphadenopathy. No retroperitoneal or mesenteric lymphadenopathy. No pelvic sidewall lymphadenopathy. Limited assessment of vascular structures due to lack of intravenous contrast. Reproductive: No adnexal mass. Small uterus with calcified fibroids. Other: Post ventral abdominal wall reconstruction with mesh. Musculoskeletal: Signs of multilevel cement augmentation of prior vertebral fractures throughout the lower thoracic and in the lumbar spine no substantial change from previous imaging. No acute or destructive bone process. Spinal degenerative changes as on previous imaging. IMPRESSION: No acute findings in the chest, abdomen or pelvis. Signs of LEFT nephrectomy since more remote imaging. No signs of nodularity in the nephrectomy bed. Small LEFT-sided pleural effusion. Discrete pulmonary nodule along the lateral pleural surface of the RIGHT middle lobe is stable since 2019. Compatible with benign pulmonary nodule. Splenomegaly with multiple splenic lesions, likely benign given stability over time. Signs of multilevel cement augmentation of prior vertebral fractures throughout the lower thoracic and lumbar spine. Aortic atherosclerosis. Electronically Signed   By: Zetta Bills M.D.   On: 06/06/2021  14:44   DG Chest Port 1 View  Result Date: 06/06/2021 CLINICAL DATA:  Chest pain for 2 weeks. EXAM: PORTABLE CHEST 1 VIEW COMPARISON:  09/13/2020 FINDINGS: The pacer wires are stable. The cardiac silhouette, mediastinal and hilar contours are within normal limits and stable. Mild tortuosity and calcification of the thoracic aorta. There is a small left pleural effusion and minimal overlying atelectasis. No edema or infiltrates. No pulmonary lesions. Stable surgical changes from thyroid surgery. The bony thorax is intact. IMPRESSION: Small left pleural effusion and minimal overlying atelectasis. Electronically Signed   By: Marijo Sanes M.D.   On: 06/06/2021 10:26    Procedures Procedures   Medications Ordered in ED Medications  iohexol (OMNIPAQUE) 350 MG/ML injection 100 mL ( Intravenous Canceled Entry 06/06/21 1342)    ED Course  I have reviewed the triage vital signs and the nursing notes.  Pertinent labs & imaging results that were available during my care of the patient were reviewed by me and considered in my medical decision making (see chart for details).  Clinical Course as of 06/06/21 1840  Wed Jun 06, 2021  1039 Chest x-ray interpreted by me as no acute infiltrates.  Radiology feels there is a small left effusion and atelectasis [MB]  1118 Patient had a low risk nuclear study in March of this year. [MB]  1937 Patient had interrogation of her Medtronic pacemaker and the report shows no evidence of device or lead performance issues observed. [MB]  1350 Patient has a working IV but CT is concerned that it may blow.  I think her likelihood of having a PE on anticoagulation is low.  She said she has been compliant with her medication. [MB]  9024 Reviewed results of work-up with patient.  Her blood pressures come down again and more in the normal range for her.  No acute findings to explain patient's symptoms.  Can be possibly related to her reflux.  Commended close follow-up with PCP.  [MB]    Clinical  Course User Index [MB] Hayden Rasmussen, MD   MDM Rules/Calculators/A&P                          [name] was evaluated in Emergency Department on 06/06/2021 for the symptoms described in the history of present illness. She was evaluated in the context of the global COVID-19 pandemic, which necessitated consideration that the patient might be at risk for infection with the SARS-CoV-2 virus that causes COVID-19. Institutional protocols and algorithms that pertain to the evaluation of patients at risk for COVID-19 are in a state of rapid change based on information released by regulatory bodies including the CDC and federal and state organizations. These policies and algorithms were followed during the patient's care in the ED.  This patient complains of chest discomfort and shortness of breath possible constipation; this involves an extensive number of treatment Options and is a complaint that carries with it a high risk of complications and Morbidity. The differential includes ACS, pneumonia, reflux, PE, pneumothorax, vascular, musculoskeletal  I ordered, reviewed and interpreted labs, which included CBC with normal white count normal hemoglobin, platelets low stable from priors, chemistries and LFTs normal, lipase not elevated, troponin flat, BNP mildly elevated, COVID and flu negative, urinalysis without signs of infection  I ordered imaging studies which included chest x-ray and CT chest abdomen and pelvis and I independently    visualized and interpreted imaging which showed no acute findings.  Small effusion.  Pulmonary nodule that patient was aware of. Additional history obtained from patient's caregiver Previous records obtained and reviewed in epic including prior PCP visits.  Has been seen for likely GI symptoms  After the interventions stated above, I reevaluated the patient and found patient to be fairly asymptomatic.  Blood pressures mildly elevated here.   Recommended close follow-up with primary care doctor for further evaluation of this.  She is otherwise comfortable continuing her GI regimen.  Return instructions discussed   Final Clinical Impression(s) / ED Diagnoses Final diagnoses:  Nonspecific chest pain  Primary hypertension    Rx / DC Orders ED Discharge Orders     None        Hayden Rasmussen, MD 06/06/21 1843

## 2021-06-06 NOTE — Patient Instructions (Signed)
Please proceed to the ER (on the first floor) for further evaluation of your chest pain.

## 2021-06-06 NOTE — ED Triage Notes (Signed)
Pt arrives ambulatory to ED, was sent from doctor, pt reports Nov 21st her symptoms started with what she thought was reflux. Also reports some SOB. Family reports some coughing at home and fatigue. Pt has not tested for Covid. CP is central, she did have some pain in her neck and jaw law, pain has been intermittent. Also c/o constipation X3 weeks. States last BM was on the 20th and she has had one small one since then. Has been using benefiber at home and had normal oral intake.

## 2021-06-06 NOTE — Assessment & Plan Note (Signed)
Uncontrolled. Advised pt to add miralax 1 capful in 8 oz of liquid tonight (assuming she is discharged from the ED) and may repeat tomorrow if no BM.

## 2021-06-06 NOTE — Assessment & Plan Note (Signed)
BP Readings from Last 3 Encounters:  06/06/21 (!) 206/84  05/08/21 132/84  12/11/20 130/86   Very uncontrolled today on current regimen. Will refer to ED.

## 2021-06-06 NOTE — Progress Notes (Signed)
Subjective:   By signing my name below, I, [name], attest that this documentation has been prepared under the direction and in the presence of Debbrah Alar NP. 06/06/2021    Patient ID: [name], female    DOB: 17-Apr-1941, 80 y.o.   MRN: [id number]  Patient is in today for a office visit. She is an 80 yr old female with hx of heart block/PAF, s/p PPM (followed by Dr. Caryl Comes) who presents today with c/o of progressive SOB and substernal chest pain. Pain is not relieved by otc antacids.   SOB- She complains of intermittent belching, SOB, and abdominal pain since 05/21/2021. Her symptoms recently became more frequent and worsened and she developed chest pain. She thought her symptoms were related to her reflux symptoms. Her SOB is not impeding her daily activities but she notices her breathing is labored. She denies having any heart palpitations.  Blood pressure- Her blood pressure is elevated during this visit. She continues taking 40 mg lisinopril daily PO, 25 mg metoprolol tartrate daily PO, 1 mg doxazosin daily PO.   BP Readings from Last 3 Encounters:  06/06/21 (!) 206/84  05/08/21 132/84  12/11/20 130/86   Pulse Readings from Last 3 Encounters:  06/06/21 81  05/08/21 67  12/11/20 86  She reports that she had an episode of fecal incontinence on 11/21, but since that time she has had no further BM's. She is taking a daily fiber supplement.   Health Maintenance Due  Topic Date Due   TETANUS/TDAP  Never done   Zoster Vaccines- Shingrix (1 of 2) Never done   COVID-19 Vaccine (4 - Booster for Pfizer series) 07/03/2020    Past Medical History:  Diagnosis Date   Atrioventricular block, complete ([place])    a. 08/2011 Upgrade of PPM to MDT Adapta L Dual Chamber PPM ser # [id number].   Cardiac pacemaker in situ    Contact dermatitis and eczema    unspec cause   Dairy product intolerance    Depression    Diastolic dysfunction    GERD (gastroesophageal reflux disease)     Hiatal hernia    History of diverticulitis of colon    2003  perforated diverticulitis w/ surgical intervention   History of kidney stones    age 56   History of peripheral edema    lower extremities   History of thyroid nodule    multinodular goiter  s/p  total thyroidectomy 2014   HTN (hypertension)    Hydronephrosis, left    Hypothyroidism    Mild sleep apnea    per study 04-14-2014   Nocturia    NSVT (nonsustained ventricular tachycardia)    Osteoporosis    PAF (paroxysmal atrial fibrillation) ([facility])    PONV (postoperative nausea and vomiting)    Pulmonary nodules    stable per last ct   Renal cell carcinoma of left kidney ([place])    Superficial thrombophlebitis     Past Surgical History:  Procedure Laterality Date   CARDIOVASCULAR STRESS TEST  04-20-2015  dr Caryl Comes   normal nuclear study/  no ischemia/  normal LV function and wall motion, ef 74%   COLONOSCOPY  01/19/2013   COLOSTOMY TAKEDOWN  07-22-2002   CYSTOSCOPY WITH RETROGRADE PYELOGRAM, URETEROSCOPY AND STENT PLACEMENT Left 07/28/2015   Procedure: CYSTOSCOPY WITH LEFT RETROGRADE PYELOGRAM, POSSIBLE LEFT URETEROSCOPY AND STENT PLACEMENT;  Surgeon: Franchot Gallo, MD;  Location: [facility];  Service: Urology;  Laterality: Left;   EXPLORATORY LAPARTOMY SIGMOID COLECTOMY/  COLOSTOMY  04-28-2002   perforated diverticulitis   KYPHOPLASTY     LAPAROSCOPY VENTRAL HERNIA REPAIR/  EXTENSIVE LYSIS ADHESIONS  05-04-2004   PACEMAKER GENERATOR CHANGE Left 09/16/2011   MDT ADDRL1 pacemaker   PACEMAKER PLACEMENT  05-11-2003     medtronic   ROBOT ASSISTED LAPAROSCOPIC NEPHRECTOMY Left 03/20/2016   Procedure: XI ROBOTIC ASSISTED LAPAROSCOPIC RETROPERITONEAL NEPHRECTOMY;  Surgeon: Alexis Frock, MD;  Location: [facility];  Service: Urology;  Laterality: Left;   THYROIDECTOMY N/A 08/07/2012   Procedure: THYROIDECTOMY;  Surgeon: Earnstine Regal, MD;  Location: [facility];  Service: General;  Laterality: N/A;   TRANSTHORACIC  ECHOCARDIOGRAM  10-12-2014   moderate focal basal LVH,  ef 55-60%,  grade 2 diastolic dysfunction, mild AV calcification without stenosis,  mild MR,  trivial PR    Family History  Problem Relation Age of Onset   Heart disease Mother        Stent in her 76s   Stroke Mother    Hypertension Mother    Heart failure Father    Hypertension Brother    Heart disease Brother    Diverticulosis Sister    Hypertension Brother    Cancer Son        breast   Heart attack Neg Hx     Social History   Socioeconomic History   Marital status: Single    Spouse name: Not on file   Number of children: 2   Years of education: 16   Highest education level: Not on file  Occupational History   Occupation: Retired Production assistant, radio for the [employer]    Employer: RETIRED  Tobacco Use   Smoking status: Former    Years: 15.00    Types: Cigarettes    Quit date: 10/06/1979    Years since quitting: 41.6   Smokeless tobacco: Never  Substance and Sexual Activity   Alcohol use: No   Drug use: No   Sexual activity: Not on file  Other Topics Concern   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  She was married for 22 years, divorced, and has remained single. She has 1 grown son, 1 grown daughter.  She has 3 grandchildren, 2 of her children   live in [place].  She is retired.  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 She had a long term companion, 32 years, who passed away 15-Sep-2010.   Social Determinants of Health   Financial Resource Strain: Low Risk    Difficulty of Paying Living Expenses: Not hard at all  Food Insecurity: No Food Insecurity   Worried About Charity fundraiser in the Last Year: Never true   [place] in the Last Year: Never true  Transportation Needs: No Transportation Needs   Lack of Transportation (Medical): No   Lack of Transportation  (Non-Medical): No  Physical Activity: Insufficiently Active   Days of Exercise per Week: 4 days   Minutes of Exercise per Session: 30 min  Stress: No Stress Concern Present   Feeling of Stress : Not at all  Social Connections: Socially Isolated   Frequency of Communication with Friends and Family: More than three times a week   Frequency of Social Gatherings with Friends and Family: More than three times a week   Attends Religious Services: Never   Active Member  of Clubs or Organizations: No   Attends Archivist Meetings: Never   Marital Status: Divorced  Human resources officer Violence: Not At Risk   Fear of Current or Ex-Partner: No   Emotionally Abused: No   Physically Abused: No   Sexually Abused: No    Facility-Administered Medications Prior to Visit  Medication Dose Route Frequency Provider Last Rate Last Admin   aminophylline injection 150 mg  150 mg Intravenous BID PRN Dorothy Spark, MD   150 mg at 04/20/15 1200   Outpatient Medications Prior to Visit  Medication Sig Dispense Refill   acetaminophen (TYLENOL) 500 MG tablet Take 1,000 mg by mouth daily as needed for moderate pain.     apixaban (ELIQUIS) 5 MG TABS tablet Take 1 tablet (5 mg total) by mouth 2 (two) times daily. 180 tablet 1   denosumab (PROLIA) 60 MG/ML SOSY injection Inject 60 mg into the skin every 6 (six) months.     diclofenac Sodium (VOLTAREN) 1 % GEL APPLY 4 GRAMS ON TO THE SKIN 4 (FOUR) TIMES DAILY. 100 g 2   diphenhydramine-acetaminophen (TYLENOL PM) 25-500 MG TABS tablet Take 2 tablets by mouth at bedtime as needed (sleep/pain).     doxazosin (CARDURA) 1 MG tablet Take 1 tablet (1 mg total) by mouth daily. 30 tablet 1   gabapentin (NEURONTIN) 100 MG capsule Take 1 capsule (100 mg total) by mouth 3 (three) times daily. 90 capsule 5   levocetirizine (XYZAL) 5 MG tablet TAKE 1 TABLET (5 MG TOTAL) BY MOUTH EVERY EVENING. 90 tablet 3   levothyroxine (SYNTHROID) 100 MCG tablet Take 1 tablet (100 mcg  total) by mouth daily before breakfast. 90 tablet 3   lisinopril (ZESTRIL) 40 MG tablet Take 1 tablet by mouth daily. 90 tablet 1   loratadine (CLARITIN) 10 MG tablet TAKE 1 TABLET (10 MG TOTAL) BY MOUTH DAILY AS NEEDED FOR ALLERGIES. 100 tablet 2   metoprolol tartrate (LOPRESSOR) 25 MG tablet Take 1 tablet (25 mg total) by mouth 2 (two) times daily. 180 tablet 1   pantoprazole (PROTONIX) 40 MG tablet Take 1 tablet (40 mg total) by mouth 2 (two) times daily before a meal. 60 tablet 2   fluticasone (FLONASE) 50 MCG/ACT nasal spray PLACE 2 SPRAYS INTO BOTH NOSTRILS DAILY AS NEEDED FOR ALLERGIES OR RHINITIS. 16 g 2    Allergies  Allergen Reactions   Tape Rash   Codeine Nausea And Vomiting    Review of Systems  Respiratory:  Positive for shortness of breath.   Cardiovascular:  Positive for chest pain. Negative for palpitations.  Gastrointestinal:  Positive for abdominal pain.       (+)belching      Objective:    Physical Exam Constitutional:      General: She is not in acute distress.    Appearance: Normal appearance. She is well-developed. She is not ill-appearing.     Comments: Appears short of breath sitting in chair, holding her hand over her mid chest for most of the visit.   HENT:     Head: Normocephalic and atraumatic.     Right Ear: External ear normal.     Left Ear: External ear normal.  Eyes:     General: No scleral icterus.    Extraocular Movements: Extraocular movements intact.     Pupils: Pupils are equal, round, and reactive to light.  Neck:     Thyroid: No thyromegaly.  Cardiovascular:     Rate and Rhythm: Normal rate and regular  rhythm.     Heart sounds: Normal heart sounds. No murmur heard.   No gallop.  Pulmonary:     Effort: Pulmonary effort is normal. No respiratory distress.     Breath sounds: Normal breath sounds. No wheezing or rales.  Abdominal:     General: Bowel sounds are normal. There is distension.     Tenderness: There is no abdominal  tenderness.  Musculoskeletal:     Cervical back: Neck supple.  Skin:    General: Skin is warm and dry.  Neurological:     Mental Status: She is alert and oriented to person, place, and time.  Psychiatric:        Mood and Affect: Mood normal.        Behavior: Behavior normal.        Thought Content: Thought content normal.        Judgment: Judgment normal.    BP (!) 206/84 (BP Location: Right Arm, Patient Position: Sitting, Cuff Size: Small)   Pulse 81   Temp 97.9 F (36.6 C) (Oral)   Resp 16   Ht 5\' 3"  (1.6 m)   Wt 153 lb (69.4 kg)   SpO2 100%   BMI 27.10 kg/m  Wt Readings from Last 3 Encounters:  06/06/21 153 lb (69.4 kg)  05/08/21 153 lb (69.4 kg)  03/21/21 151 lb (68.5 kg)       Assessment & Plan:   Problem List Items Addressed This Visit       Unprioritized   Substernal chest pain - Primary    New. EKG tracing is personally reviewed.  EKG notes NSR, RBBB, LAFB.  Due to ongoing chest pain and uncontrolled bp, will refer to ED for further evaluation.  Report was given to EDP, Dr. Melina Copa at the [facility].        Relevant Orders   EKG 12-Lead (Completed)   Essential hypertension    BP Readings from Last 3 Encounters:  06/06/21 (!) 206/84  05/08/21 132/84  12/11/20 130/86  Very uncontrolled today on current regimen. Will refer to ED.       Constipation    Uncontrolled. Advised pt to add miralax 1 capful in 8 oz of liquid tonight (assuming she is discharged from the ED) and may repeat tomorrow if no BM.        No orders of the defined types were placed in this encounter.  45 minutes spent on today's visit. Time was spent reviewing patient's chart, counseling patient and coordinating patient care.  I, Nance Pear, NP, personally preformed the services described in this documentation.  All medical record entries made by the scribe were at my direction and in my presence.  I have reviewed the chart and discharge instructions (if applicable) and  agree that the record reflects my personal performance and is accurate and complete. @ENCDATE @     Nance Pear, NP

## 2021-06-06 NOTE — Discharge Instructions (Signed)
You were seen in the emergency department for some chest discomfort along with elevated blood pressure.  You had blood work EKG chest x-ray and a CAT scan of your chest abdomen and pelvis that did not show any serious findings.  Please continue your regular medications and follow-up with your primary care doctor.  Return to the emergency department if any worsening or concerning symptoms.

## 2021-06-06 NOTE — Assessment & Plan Note (Signed)
New. EKG tracing is personally reviewed.  EKG notes NSR, RBBB, LAFB.  Due to ongoing chest pain and uncontrolled bp, will refer to ED for further evaluation.  Report was given to EDP, Dr. Melina Copa at the [facility] ED.

## 2021-06-11 ENCOUNTER — Encounter: Payer: Self-pay | Admitting: Family Medicine

## 2021-06-12 ENCOUNTER — Other Ambulatory Visit (HOSPITAL_BASED_OUTPATIENT_CLINIC_OR_DEPARTMENT_OTHER): Payer: Self-pay

## 2021-06-12 ENCOUNTER — Other Ambulatory Visit: Payer: Self-pay | Admitting: Family Medicine

## 2021-06-12 DIAGNOSIS — I10 Essential (primary) hypertension: Secondary | ICD-10-CM

## 2021-06-12 MED ORDER — AMLODIPINE BESYLATE 5 MG PO TABS
5.0000 mg | ORAL_TABLET | Freq: Every day | ORAL | 1 refills | Status: DC
  Filled 2021-06-12: qty 90, 90d supply, fill #0

## 2021-06-13 ENCOUNTER — Other Ambulatory Visit (HOSPITAL_BASED_OUTPATIENT_CLINIC_OR_DEPARTMENT_OTHER): Payer: Self-pay

## 2021-06-15 ENCOUNTER — Other Ambulatory Visit (HOSPITAL_BASED_OUTPATIENT_CLINIC_OR_DEPARTMENT_OTHER): Payer: Self-pay

## 2021-06-15 ENCOUNTER — Encounter: Payer: Self-pay | Admitting: Family Medicine

## 2021-06-15 ENCOUNTER — Ambulatory Visit (INDEPENDENT_AMBULATORY_CARE_PROVIDER_SITE_OTHER): Payer: Medicare Other | Admitting: Family Medicine

## 2021-06-15 VITALS — BP 160/80 | HR 97 | Temp 97.4°F | Resp 18 | Ht 63.0 in | Wt 153.8 lb

## 2021-06-15 DIAGNOSIS — R0602 Shortness of breath: Secondary | ICD-10-CM | POA: Diagnosis not present

## 2021-06-15 DIAGNOSIS — R072 Precordial pain: Secondary | ICD-10-CM

## 2021-06-15 DIAGNOSIS — R911 Solitary pulmonary nodule: Secondary | ICD-10-CM | POA: Diagnosis not present

## 2021-06-15 DIAGNOSIS — I10 Essential (primary) hypertension: Secondary | ICD-10-CM

## 2021-06-15 MED ORDER — DOXAZOSIN MESYLATE 2 MG PO TABS
2.0000 mg | ORAL_TABLET | Freq: Every day | ORAL | 2 refills | Status: DC
  Filled 2021-06-15: qty 30, 30d supply, fill #0

## 2021-06-15 NOTE — Patient Instructions (Signed)

## 2021-06-15 NOTE — Assessment & Plan Note (Signed)
Refer to pulmonary

## 2021-06-15 NOTE — Progress Notes (Addendum)
Subjective:   By signing my name below, I, Zite Okoli, attest that this documentation has been prepared under the direction and in the presence of  Roma Schanz R DO. 06/15/2021   Patient ID: [name], female    DOB: 28-May-1941, 80 y.o.   MRN: [id number]  Chief Complaint  Patient presents with   ED visit follow up    HTN and chest pain    HPI Patient is in today for an office visit.  Patient was seen in the ED presenting with complaints of lower chest and upper abdominal pressure associated with shortness of breath and dyspnea on exertion. She had also been coughing , congested and had not moved her bowel in 3 weeks.  Today, her blood pressure is elevated. She checks her blood pressure at home and the systolic ranges from 725-366. She has not seen her cardiologist this year and just gets her pacemaker checked. She started 5 mg amlodipine two days ago and has stopped 1 mg Cardura. BP Readings from Last 3 Encounters:  06/15/21 (!) 160/80  06/06/21 (!) 159/85  06/06/21 (!) 206/84    She is showing symptoms of chest pain, shortness of breath and when she takes a deep breath, she coughs. These have improved since her ER visit. She is still having stomach problems but has had bowel movements since her last ER visit. The shortness of breath started on 05/21/2021 and was associated with weakness, body aches and cough. She was not able to get tested for Covid-19 and wonders if she could have had that then and this is related.    Past Medical History:  Diagnosis Date   Atrioventricular block, complete ([place])    a. 08/2011 Upgrade of PPM to MDT Adapta L Dual Chamber PPM ser # [id number].   Cardiac pacemaker in situ    Contact dermatitis and eczema    unspec cause   Dairy product intolerance    Depression    Diastolic dysfunction    GERD (gastroesophageal reflux disease)    Hiatal hernia    History of diverticulitis of colon    2003  perforated diverticulitis w/ surgical  intervention   History of kidney stones    age 25   History of peripheral edema    lower extremities   History of thyroid nodule    multinodular goiter  s/p  total thyroidectomy 2014   HTN (hypertension)    Hydronephrosis, left    Hypothyroidism    Mild sleep apnea    per study 04-14-2014   Nocturia    NSVT (nonsustained ventricular tachycardia)    Osteoporosis    PAF (paroxysmal atrial fibrillation) ([facility])    PONV (postoperative nausea and vomiting)    Pulmonary nodules    stable per last ct   Renal cell carcinoma of left kidney ([place])    Superficial thrombophlebitis     Past Surgical History:  Procedure Laterality Date   CARDIOVASCULAR STRESS TEST  04-20-2015  dr Caryl Comes   normal nuclear study/  no ischemia/  normal LV function and wall motion, ef 74%   COLONOSCOPY  01/19/2013   COLOSTOMY TAKEDOWN  07-22-2002   CYSTOSCOPY WITH RETROGRADE PYELOGRAM, URETEROSCOPY AND STENT PLACEMENT Left 07/28/2015   Procedure: CYSTOSCOPY WITH LEFT RETROGRADE PYELOGRAM, POSSIBLE LEFT URETEROSCOPY AND STENT PLACEMENT;  Surgeon: Franchot Gallo, MD;  Location: [facility];  Service: Urology;  Laterality: Left;   EXPLORATORY LAPARTOMY SIGMOID COLECTOMY/ COLOSTOMY  04-28-2002   perforated diverticulitis   KYPHOPLASTY  LAPAROSCOPY VENTRAL HERNIA REPAIR/  EXTENSIVE LYSIS ADHESIONS  05-04-2004   PACEMAKER GENERATOR CHANGE Left 09/16/2011   MDT ADDRL1 pacemaker   PACEMAKER PLACEMENT  05-11-2003     medtronic   ROBOT ASSISTED LAPAROSCOPIC NEPHRECTOMY Left 03/20/2016   Procedure: XI ROBOTIC ASSISTED LAPAROSCOPIC RETROPERITONEAL NEPHRECTOMY;  Surgeon: Alexis Frock, MD;  Location: [facility];  Service: Urology;  Laterality: Left;   THYROIDECTOMY N/A 08/07/2012   Procedure: THYROIDECTOMY;  Surgeon: Earnstine Regal, MD;  Location: [facility];  Service: General;  Laterality: N/A;   TRANSTHORACIC ECHOCARDIOGRAM  10-12-2014   moderate focal basal LVH,  ef 55-60%,  grade 2 diastolic dysfunction, mild AV  calcification without stenosis,  mild MR,  trivial PR    Family History  Problem Relation Age of Onset   Heart disease Mother        Stent in her 62s   Stroke Mother    Hypertension Mother    Heart failure Father    Hypertension Brother    Heart disease Brother    Diverticulosis Sister    Hypertension Brother    Cancer Son        breast   Heart attack Neg Hx     Social History   Socioeconomic History   Marital status: Single    Spouse name: Not on file   Number of children: 2   Years of education: 16   Highest education level: Not on file  Occupational History   Occupation: Retired Production assistant, radio for the [employer]    Employer: RETIRED  Tobacco Use   Smoking status: Former    Years: 15.00    Types: Cigarettes    Quit date: 10/06/1979    Years since quitting: 41.7   Smokeless tobacco: Never  Vaping Use   Vaping Use: Never used  Substance and Sexual Activity   Alcohol use: No   Drug use: No   Sexual activity: Not on file  Other Topics Concern   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  She was married for 22 years, divorced, and has remained single. She has 1 grown son, 1 grown daughter.  She has 3 grandchildren, 2 of her children   live in [place].  She is retired.  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 She had a long term companion, 32 years, who passed away 09/07/2010.   Social Determinants of Health   Financial Resource Strain: Low Risk    Difficulty of Paying Living Expenses: Not hard at all  Food Insecurity: No Food Insecurity   Worried About Charity fundraiser in the Last Year: Never true   [place] in the Last Year: Never true  Transportation Needs: No Transportation Needs   Lack of Transportation (Medical): No   Lack of Transportation (Non-Medical): No  Physical Activity: Insufficiently Active   Days of  Exercise per Week: 4 days   Minutes of Exercise per Session: 30 min  Stress: No Stress Concern Present   Feeling of Stress : Not at all  Social Connections: Socially Isolated   Frequency of Communication with Friends and Family: More than three times a week   Frequency of Social Gatherings with Friends and Family: More than three times a week   Attends Religious Services: Never   Marine scientist or Organizations: No  Attends Archivist Meetings: Never   Marital Status: Divorced  Human resources officer Violence: Not At Risk   Fear of Current or Ex-Partner: No   Emotionally Abused: No   Physically Abused: No   Sexually Abused: No    Outpatient Medications Prior to Visit  Medication Sig Dispense Refill   acetaminophen (TYLENOL) 500 MG tablet Take 1,000 mg by mouth daily as needed for moderate pain.     amLODipine (NORVASC) 5 MG tablet Take 1 tablet (5 mg total) by mouth daily. 90 tablet 1   apixaban (ELIQUIS) 5 MG TABS tablet Take 1 tablet (5 mg total) by mouth 2 (two) times daily. 180 tablet 1   denosumab (PROLIA) 60 MG/ML SOSY injection Inject 60 mg into the skin every 6 (six) months.     diclofenac Sodium (VOLTAREN) 1 % GEL APPLY 4 GRAMS ON TO THE SKIN 4 (FOUR) TIMES DAILY. 100 g 2   diphenhydramine-acetaminophen (TYLENOL PM) 25-500 MG TABS tablet Take 2 tablets by mouth at bedtime as needed (sleep/pain).     gabapentin (NEURONTIN) 100 MG capsule Take 1 capsule (100 mg total) by mouth 3 (three) times daily. 90 capsule 5   levocetirizine (XYZAL) 5 MG tablet TAKE 1 TABLET (5 MG TOTAL) BY MOUTH EVERY EVENING. 90 tablet 3   levothyroxine (SYNTHROID) 100 MCG tablet Take 1 tablet (100 mcg total) by mouth daily before breakfast. 90 tablet 3   lisinopril (ZESTRIL) 40 MG tablet Take 1 tablet by mouth daily. 90 tablet 1   loratadine (CLARITIN) 10 MG tablet TAKE 1 TABLET (10 MG TOTAL) BY MOUTH DAILY AS NEEDED FOR ALLERGIES. 100 tablet 2   metoprolol tartrate (LOPRESSOR) 25 MG tablet  Take 1 tablet (25 mg total) by mouth 2 (two) times daily. 180 tablet 1   pantoprazole (PROTONIX) 40 MG tablet Take 1 tablet (40 mg total) by mouth 2 (two) times daily before a meal. 60 tablet 2   doxazosin (CARDURA) 1 MG tablet Take 1 tablet (1 mg total) by mouth daily. 30 tablet 1   fluticasone (FLONASE) 50 MCG/ACT nasal spray PLACE 2 SPRAYS INTO BOTH NOSTRILS DAILY AS NEEDED FOR ALLERGIES OR RHINITIS. 16 g 2   Facility-Administered Medications Prior to Visit  Medication Dose Route Frequency Provider Last Rate Last Admin   aminophylline injection 150 mg  150 mg Intravenous BID PRN Dorothy Spark, MD   150 mg at 04/20/15 1200    Allergies  Allergen Reactions   Tape Rash   Codeine Nausea And Vomiting    Review of Systems  Constitutional:  Negative for fever.  HENT:  Negative for congestion, ear pain, hearing loss, sinus pain and sore throat.   Eyes:  Negative for blurred vision and pain.  Respiratory:  Positive for shortness of breath. Negative for cough, sputum production and wheezing.   Cardiovascular:  Positive for chest pain. Negative for palpitations.  Gastrointestinal:  Positive for constipation. Negative for blood in stool, diarrhea, nausea and vomiting.  Genitourinary:  Negative for dysuria, frequency, hematuria and urgency.  Musculoskeletal:  Negative for back pain, falls and myalgias.  Neurological:  Negative for dizziness, sensory change, loss of consciousness, weakness and headaches.  Endo/Heme/Allergies:  Negative for environmental allergies. Does not bruise/bleed easily.  Psychiatric/Behavioral:  Negative for depression and suicidal ideas. The patient is not nervous/anxious and does not have insomnia.       Objective:    Physical Exam Vitals and nursing note reviewed.  Constitutional:      General: She is not  in acute distress.    Appearance: Normal appearance. She is not ill-appearing.  HENT:     Head: Normocephalic and atraumatic.     Right Ear: External ear  normal.     Left Ear: External ear normal.  Eyes:     Extraocular Movements: Extraocular movements intact.     Pupils: Pupils are equal, round, and reactive to light.  Cardiovascular:     Rate and Rhythm: Normal rate and regular rhythm.     Pulses: Normal pulses.     Heart sounds: Normal heart sounds. No murmur heard.   No gallop.  Pulmonary:     Effort: Pulmonary effort is normal. No respiratory distress.     Breath sounds: Normal breath sounds. No wheezing, rhonchi or rales.  Abdominal:     General: Bowel sounds are normal. There is no distension.     Palpations: Abdomen is soft. There is no mass.     Tenderness: There is no abdominal tenderness. There is no guarding or rebound.     Hernia: No hernia is present.  Musculoskeletal:     Cervical back: Normal range of motion and neck supple.  Lymphadenopathy:     Cervical: No cervical adenopathy.  Skin:    General: Skin is warm and dry.  Neurological:     Mental Status: She is alert and oriented to person, place, and time.  Psychiatric:        Behavior: Behavior normal.    BP (!) 160/80 (BP Location: Right Arm, Patient Position: Sitting, Cuff Size: Normal)    Pulse 97    Temp (!) 97.4 F (36.3 C) (Oral)    Resp 18    Ht '5\' 3"'  (1.6 m)    Wt 153 lb 12.8 oz (69.8 kg)    SpO2 100%    BMI 27.24 kg/m  Wt Readings from Last 3 Encounters:  06/15/21 153 lb 12.8 oz (69.8 kg)  06/06/21 153 lb (69.4 kg)  06/06/21 153 lb (69.4 kg)    Diabetic Foot Exam - Simple   No data filed    Lab Results  Component Value Date   WBC 4.3 06/06/2021   HGB 12.4 06/06/2021   HCT 38.1 06/06/2021   PLT 132 (L) 06/06/2021   GLUCOSE 97 06/06/2021   CHOL 175 07/12/2019   TRIG 98.0 07/12/2019   HDL 49.90 07/12/2019   LDLCALC 106 (H) 07/12/2019   ALT 14 06/06/2021   AST 22 06/06/2021   NA 139 06/06/2021   K 3.7 06/06/2021   CL 104 06/06/2021   CREATININE 0.96 06/06/2021   BUN 18 06/06/2021   CO2 24 06/06/2021   TSH 1.33 12/20/2020   INR 1.01  11/04/2013   HGBA1C 5.5 06/07/2016    Lab Results  Component Value Date   TSH 1.33 12/20/2020   Lab Results  Component Value Date   WBC 4.3 06/06/2021   HGB 12.4 06/06/2021   HCT 38.1 06/06/2021   MCV 84.5 06/06/2021   PLT 132 (L) 06/06/2021   Lab Results  Component Value Date   NA 139 06/06/2021   K 3.7 06/06/2021   CO2 24 06/06/2021   GLUCOSE 97 06/06/2021   BUN 18 06/06/2021   CREATININE 0.96 06/06/2021   BILITOT 0.7 06/06/2021   ALKPHOS 52 06/06/2021   AST 22 06/06/2021   ALT 14 06/06/2021   PROT 7.0 06/06/2021   ALBUMIN 4.3 06/06/2021   CALCIUM 9.1 06/06/2021   ANIONGAP 11 06/06/2021   EGFR 49 (L) 09/29/2020  GFR 50.74 (L) 10/16/2020   Lab Results  Component Value Date   CHOL 175 07/12/2019   Lab Results  Component Value Date   HDL 49.90 07/12/2019   Lab Results  Component Value Date   LDLCALC 106 (H) 07/12/2019   Lab Results  Component Value Date   TRIG 98.0 07/12/2019   Lab Results  Component Value Date   CHOLHDL 4 07/12/2019   Lab Results  Component Value Date   HGBA1C 5.5 06/07/2016       Assessment & Plan:   Problem List Items Addressed This Visit       Unprioritized   Essential hypertension    Pt just started back on the norvasc 2 days ago Recheck 2 weeks or sooner prn       Pulmonary nodule    Refer to pulmonary      Relevant Orders   Ambulatory referral to Pulmonology   SOB (shortness of breath)    New with elevated bp Ct and cxr normal except for nodule Er records reviewed  If worsens go back to er pulm referral placed       Relevant Orders   ECHOCARDIOGRAM COMPLETE   Ambulatory referral to Pulmonology   Substernal chest pain    resolved      Other Visit Diagnoses     Primary hypertension    -  Primary   Relevant Orders   ECHOCARDIOGRAM COMPLETE       Meds ordered this encounter  Medications   DISCONTD: doxazosin (CARDURA) 2 MG tablet    Sig: Take 1 tablet (2 mg total) by mouth daily.     Dispense:  30 tablet    Refill:  2    I,Zite Okoli,acting as a scribe for [employer], DO.,have documented all relevant documentation on the behalf of [name], DO,as directed by  [name], DO while in the presence of [name], DO.   I, [name] DO. , personally preformed the services described in this documentation.  All medical record entries made by the scribe were at my direction and in my presence.  I have reviewed the chart and discharge instructions (if applicable) and agree that the record reflects my personal performance and is accurate and complete. 06/15/2021

## 2021-06-15 NOTE — Assessment & Plan Note (Addendum)
New with elevated bp Ct and cxr normal except for nodule Er records reviewed  If worsens go back to er pulm referral placed

## 2021-06-15 NOTE — Assessment & Plan Note (Signed)
resolved 

## 2021-06-15 NOTE — Assessment & Plan Note (Signed)
Pt just started back on the norvasc 2 days ago Recheck 2 weeks or sooner prn

## 2021-06-16 ENCOUNTER — Other Ambulatory Visit: Payer: Self-pay | Admitting: Family Medicine

## 2021-06-20 ENCOUNTER — Telehealth (HOSPITAL_BASED_OUTPATIENT_CLINIC_OR_DEPARTMENT_OTHER): Payer: Self-pay

## 2021-06-27 ENCOUNTER — Ambulatory Visit: Payer: Medicare Other

## 2021-06-29 ENCOUNTER — Ambulatory Visit: Payer: Medicare Other

## 2021-06-29 ENCOUNTER — Ambulatory Visit (INDEPENDENT_AMBULATORY_CARE_PROVIDER_SITE_OTHER): Payer: Medicare Other

## 2021-06-29 ENCOUNTER — Other Ambulatory Visit: Payer: Self-pay

## 2021-06-29 DIAGNOSIS — M810 Age-related osteoporosis without current pathological fracture: Secondary | ICD-10-CM | POA: Diagnosis not present

## 2021-06-29 MED ORDER — DENOSUMAB 60 MG/ML ~~LOC~~ SOSY
60.0000 mg | PREFILLED_SYRINGE | Freq: Once | SUBCUTANEOUS | Status: AC
Start: 2021-06-29 — End: 2021-06-29
  Administered 2021-06-29: 11:00:00 60 mg via SUBCUTANEOUS

## 2021-06-29 NOTE — Progress Notes (Signed)
Prolia injection administered to pt's left arm. Pt tolerated well. °

## 2021-07-03 ENCOUNTER — Encounter: Payer: Self-pay | Admitting: Family Medicine

## 2021-07-03 ENCOUNTER — Ambulatory Visit (INDEPENDENT_AMBULATORY_CARE_PROVIDER_SITE_OTHER): Payer: Medicare Other | Admitting: Family Medicine

## 2021-07-03 VITALS — BP 140/80 | HR 62 | Temp 97.6°F | Resp 16 | Ht 63.0 in | Wt 152.4 lb

## 2021-07-03 DIAGNOSIS — I5032 Chronic diastolic (congestive) heart failure: Secondary | ICD-10-CM | POA: Diagnosis not present

## 2021-07-03 DIAGNOSIS — I10 Essential (primary) hypertension: Secondary | ICD-10-CM

## 2021-07-03 DIAGNOSIS — I77819 Aortic ectasia, unspecified site: Secondary | ICD-10-CM | POA: Diagnosis not present

## 2021-07-03 DIAGNOSIS — I480 Paroxysmal atrial fibrillation: Secondary | ICD-10-CM

## 2021-07-03 DIAGNOSIS — E538 Deficiency of other specified B group vitamins: Secondary | ICD-10-CM

## 2021-07-03 NOTE — Assessment & Plan Note (Addendum)
Well controlled, no changes to meds. Encouraged heart healthy diet such as the DASH diet and exercise as tolerated.  Will recheck labs at her next visit  rto 6 months or sooner prn

## 2021-07-03 NOTE — Progress Notes (Signed)
Established Patient Office Visit  Subjective:  Patient ID: [name], female    DOB: Aug 29, 1940  Age: 81 y.o. MRN: [id number]  CC:  Chief Complaint  Patient presents with   Hypertension    Pt states checking bps at home and states bps at home recently 130ish/80ish   Follow-up    HPI [name] presents for bp f/u --- bp run about 137/78--  no complaints.   Pt is doing well   Past Medical History:  Diagnosis Date   Atrioventricular block, complete ([place])    a. 08/2011 Upgrade of PPM to MDT Adapta L Dual Chamber PPM ser # [id number].   Cardiac pacemaker in situ    Contact dermatitis and eczema    unspec cause   Dairy product intolerance    Depression    Diastolic dysfunction    GERD (gastroesophageal reflux disease)    Hiatal hernia    History of diverticulitis of colon    2003  perforated diverticulitis w/ surgical intervention   History of kidney stones    age 20   History of peripheral edema    lower extremities   History of thyroid nodule    multinodular goiter  s/p  total thyroidectomy 2014   HTN (hypertension)    Hydronephrosis, left    Hypothyroidism    Mild sleep apnea    per study 04-14-2014   Nocturia    NSVT (nonsustained ventricular tachycardia)    Osteoporosis    PAF (paroxysmal atrial fibrillation) (HCC)    PONV (postoperative nausea and vomiting)    Pulmonary nodules    stable per last ct   Renal cell carcinoma of left kidney ([place])    Superficial thrombophlebitis     Past Surgical History:  Procedure Laterality Date   CARDIOVASCULAR STRESS TEST  04-20-2015  dr Caryl Comes   normal nuclear study/  no ischemia/  normal LV function and wall motion, ef 74%   COLONOSCOPY  01/19/2013   COLOSTOMY TAKEDOWN  07-22-2002   CYSTOSCOPY WITH RETROGRADE PYELOGRAM, URETEROSCOPY AND STENT PLACEMENT Left 07/28/2015   Procedure: CYSTOSCOPY WITH LEFT RETROGRADE PYELOGRAM, POSSIBLE LEFT URETEROSCOPY AND STENT PLACEMENT;  Surgeon: Franchot Gallo, MD;  [facility];  Service: Urology;  Laterality: Left;   EXPLORATORY LAPARTOMY SIGMOID COLECTOMY/ COLOSTOMY  04-28-2002   perforated diverticulitis   KYPHOPLASTY     LAPAROSCOPY VENTRAL HERNIA REPAIR/  EXTENSIVE LYSIS ADHESIONS  05-04-2004   PACEMAKER GENERATOR CHANGE Left 09/16/2011   MDT ADDRL1 pacemaker   PACEMAKER PLACEMENT  05-11-2003     medtronic   ROBOT ASSISTED LAPAROSCOPIC NEPHRECTOMY Left 03/20/2016   Procedure: XI ROBOTIC ASSISTED LAPAROSCOPIC RETROPERITONEAL NEPHRECTOMY;  Surgeon: Alexis Frock, MD;  Location: [facility];  Service: Urology;  Laterality: Left;   THYROIDECTOMY N/A 08/07/2012   Procedure: THYROIDECTOMY;  Surgeon: Earnstine Regal, MD;  Location: [facility];  Service: General;  Laterality: N/A;   TRANSTHORACIC ECHOCARDIOGRAM  10-12-2014   moderate focal basal LVH,  ef 55-60%,  grade 2 diastolic dysfunction, mild AV calcification without stenosis,  mild MR,  trivial PR    Family History  Problem Relation Age of Onset   Heart disease Mother        Stent in her 84s   Stroke Mother    Hypertension Mother    Heart failure Father    Hypertension Brother    Heart disease Brother    Diverticulosis Sister    Hypertension Brother    Cancer Son  breast   Heart attack Neg Hx     Social History   Socioeconomic History   Marital status: Single    Spouse name: Not on file   Number of children: 2   Years of education: 16   Highest education level: Not on file  Occupational History   Occupation: Retired Production assistant, 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  She worked for the [employer] in [employer].  She was married for 22 years, divorced, and has remained single. She has 1 grown son, 1 grown daughter.  She has 3 grandchildren, 2 of her children   live in [place].  She is retired.  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 She had a long term companion, 32 years, who passed away Sep 23, 2010.   Social Determinants of Health   Financial Resource Strain: Low Risk    Difficulty of Paying Living Expenses: Not hard at all  Food Insecurity: No Food Insecurity   Worried About Charity fundraiser in the Last Year: Never true   [place] in the Last Year: Never true  Transportation Needs: No Transportation Needs   Lack of Transportation (Medical): No   Lack of Transportation (Non-Medical): No  Physical Activity: Insufficiently Active   Days of Exercise per Week: 4 days   Minutes of Exercise per Session: 30 min  Stress: No Stress Concern Present   Feeling of Stress : Not at all  Social Connections: Socially Isolated   Frequency of Communication with Friends and Family: More than three times a week   Frequency of Social Gatherings with Friends and Family: More than three times a week   Attends Religious Services: Never   Marine scientist or Organizations: No   Attends Music therapist: Never   Marital Status: Divorced  Human resources officer Violence: Not At Risk   Fear of Current or Ex-Partner: No   Emotionally Abused: No   Physically Abused: No   Sexually Abused: No    Outpatient Medications Prior to Visit  Medication Sig Dispense Refill   acetaminophen (TYLENOL) 500 MG tablet Take 1,000 mg by mouth daily as needed for moderate pain.     amLODipine (NORVASC) 5 MG tablet Take 1 tablet (5 mg total) by mouth daily. 90 tablet 1   apixaban (ELIQUIS) 5 MG TABS tablet Take 1 tablet (5 mg total) by mouth 2 (two) times daily. 180 tablet 1   denosumab (PROLIA) 60 MG/ML SOSY  injection Inject 60 mg into the skin every 6 (six) months.     diclofenac Sodium (VOLTAREN) 1 % GEL APPLY 4 GRAMS ON TO THE SKIN 4 (FOUR) TIMES DAILY. 100 g 2   diphenhydramine-acetaminophen (TYLENOL PM) 25-500 MG TABS tablet Take 2 tablets by mouth at bedtime as needed (sleep/pain).     gabapentin (NEURONTIN) 100 MG capsule Take 1 capsule (100 mg total) by mouth  3 (three) times daily. 90 capsule 5   levocetirizine (XYZAL) 5 MG tablet TAKE 1 TABLET (5 MG TOTAL) BY MOUTH EVERY EVENING. 90 tablet 3   levothyroxine (SYNTHROID) 100 MCG tablet Take 1 tablet (100 mcg total) by mouth daily before breakfast. 90 tablet 3   lisinopril (ZESTRIL) 40 MG tablet Take 1 tablet by mouth daily. 90 tablet 1   metoprolol tartrate (LOPRESSOR) 25 MG tablet Take 1 tablet by mouth twice daily 180 tablet 0   pantoprazole (PROTONIX) 40 MG tablet Take 1 tablet (40 mg total) by mouth 2 (two) times daily before a meal. 60 tablet 2   fluticasone (FLONASE) 50 MCG/ACT nasal spray PLACE 2 SPRAYS INTO BOTH NOSTRILS DAILY AS NEEDED FOR ALLERGIES OR RHINITIS. 16 g 2   loratadine (CLARITIN) 10 MG tablet TAKE 1 TABLET (10 MG TOTAL) BY MOUTH DAILY AS NEEDED FOR ALLERGIES. 100 tablet 2   Facility-Administered Medications Prior to Visit  Medication Dose Route Frequency Provider Last Rate Last Admin   aminophylline injection 150 mg  150 mg Intravenous BID PRN Dorothy Spark, MD   150 mg at 04/20/15 1200    Allergies  Allergen Reactions   Tape Rash   Codeine Nausea And Vomiting    ROS Review of Systems  Constitutional:  Negative for activity change, appetite change, fatigue and unexpected weight change.  Respiratory:  Negative for cough and shortness of breath.   Cardiovascular:  Negative for chest pain and palpitations.  Psychiatric/Behavioral:  Negative for behavioral problems and dysphoric mood. The patient is not nervous/anxious.      Objective:    Physical Exam Vitals and nursing note reviewed.  Constitutional:       Appearance: She is well-developed.  HENT:     Head: Normocephalic and atraumatic.  Eyes:     Conjunctiva/sclera: Conjunctivae normal.  Neck:     Thyroid: No thyromegaly.     Vascular: No carotid bruit or JVD.  Cardiovascular:     Rate and Rhythm: Normal rate and regular rhythm.     Heart sounds: Normal heart sounds. No murmur heard. Pulmonary:     Effort: Pulmonary effort is normal. No respiratory distress.     Breath sounds: Normal breath sounds. No wheezing or rales.  Chest:     Chest wall: No tenderness.  Musculoskeletal:     Cervical back: Normal range of motion and neck supple.  Neurological:     General: No focal deficit present.     Mental Status: She is alert and oriented to person, place, and time.  Psychiatric:        Mood and Affect: Mood normal.        Behavior: Behavior normal.        Thought Content: Thought content normal.        Judgment: Judgment normal.   BP 140/80 (BP Location: Right Arm, Patient Position: Sitting, Cuff Size: Normal)    Pulse 62    Temp 97.6 F (36.4 C) (Oral)    Resp 16    Ht '5\' 3"'  (1.6 m)    Wt 152 lb 6.4 oz (69.1 kg)    SpO2 98%    BMI 27.00 kg/m  Wt Readings from Last 3 Encounters:  07/03/21 152 lb 6.4 oz (69.1 kg)  06/15/21 153 lb 12.8 oz (69.8 kg)  06/06/21 153 lb (69.4 kg)     Health Maintenance Due  Topic Date Due   TETANUS/TDAP  Never done   Zoster Vaccines- Shingrix (1 of 2) Never  done   COVID-19 Vaccine (4 - Booster for Pfizer series) 07/03/2020    There are no preventive care reminders to display for this patient.  Lab Results  Component Value Date   TSH 1.33 12/20/2020   Lab Results  Component Value Date   WBC 4.3 06/06/2021   HGB 12.4 06/06/2021   HCT 38.1 06/06/2021   MCV 84.5 06/06/2021   PLT 132 (L) 06/06/2021   Lab Results  Component Value Date   NA 139 06/06/2021   K 3.7 06/06/2021   CO2 24 06/06/2021   GLUCOSE 97 06/06/2021   BUN 18 06/06/2021   CREATININE 0.96 06/06/2021   BILITOT 0.7  06/06/2021   ALKPHOS 52 06/06/2021   AST 22 06/06/2021   ALT 14 06/06/2021   PROT 7.0 06/06/2021   ALBUMIN 4.3 06/06/2021   CALCIUM 9.1 06/06/2021   ANIONGAP 11 06/06/2021   EGFR 49 (L) 09/29/2020   GFR 50.74 (L) 10/16/2020   Lab Results  Component Value Date   CHOL 175 07/12/2019   Lab Results  Component Value Date   HDL 49.90 07/12/2019   Lab Results  Component Value Date   LDLCALC 106 (H) 07/12/2019   Lab Results  Component Value Date   TRIG 98.0 07/12/2019   Lab Results  Component Value Date   CHOLHDL 4 07/12/2019   Lab Results  Component Value Date   HGBA1C 5.5 06/07/2016      Assessment & Plan:   Problem List Items Addressed This Visit       Unprioritized   Essential hypertension - Primary    Well controlled, no changes to meds. Encouraged heart healthy diet such as the DASH diet and exercise as tolerated.  Will recheck labs at her next visit  rto 6 months or sooner prn       Relevant Orders   Lipid panel   CBC with Differential/Platelet   Comprehensive metabolic panel   Vitamin G92   Aortic ectasia, unspecified site (HCC)   Chronic diastolic heart failure (HCC)    On metoprolol  Per cardiology      Paroxysmal atrial fibrillation (HCC)    On eliquis and metoprolol Per cardiology      Other Visit Diagnoses     Primary hypertension       Relevant Orders   Lipid panel   CBC with Differential/Platelet   Comprehensive metabolic panel   Vitamin J19   B12 deficiency       Relevant Orders   CBC with Differential/Platelet   Vitamin B12       No orders of the defined types were placed in this encounter.   Follow-up: Return in about 5 months (around 12/01/2021), or if symptoms worsen or fail to improve, for hypertension.    [name], DO

## 2021-07-03 NOTE — Patient Instructions (Signed)

## 2021-07-04 DIAGNOSIS — I77819 Aortic ectasia, unspecified site: Secondary | ICD-10-CM | POA: Insufficient documentation

## 2021-07-04 DIAGNOSIS — I5032 Chronic diastolic (congestive) heart failure: Secondary | ICD-10-CM | POA: Insufficient documentation

## 2021-07-04 NOTE — Assessment & Plan Note (Signed)
On metoprolol  Per cardiology

## 2021-07-04 NOTE — Assessment & Plan Note (Signed)
On eliquis and metoprolol Per cardiology

## 2021-07-08 ENCOUNTER — Other Ambulatory Visit: Payer: Self-pay | Admitting: Family Medicine

## 2021-07-08 DIAGNOSIS — I10 Essential (primary) hypertension: Secondary | ICD-10-CM

## 2021-07-09 ENCOUNTER — Other Ambulatory Visit (HOSPITAL_BASED_OUTPATIENT_CLINIC_OR_DEPARTMENT_OTHER): Payer: Self-pay

## 2021-07-09 MED ORDER — LISINOPRIL 40 MG PO TABS
40.0000 mg | ORAL_TABLET | Freq: Every day | ORAL | 1 refills | Status: DC
  Filled 2021-07-09 – 2021-07-17 (×2): qty 90, 90d supply, fill #0
  Filled 2021-10-17: qty 90, 90d supply, fill #1

## 2021-07-09 MED ORDER — LEVOCETIRIZINE DIHYDROCHLORIDE 5 MG PO TABS
5.0000 mg | ORAL_TABLET | Freq: Every evening | ORAL | 3 refills | Status: DC
Start: 2021-07-09 — End: 2022-08-04
  Filled 2021-07-09 – 2021-07-17 (×2): qty 90, 90d supply, fill #0
  Filled 2021-10-26: qty 90, 90d supply, fill #1
  Filled 2022-02-03: qty 90, 90d supply, fill #2
  Filled 2022-05-09: qty 90, 90d supply, fill #3

## 2021-07-11 NOTE — Progress Notes (Signed)
Cardiology Office Note Date:  07/11/2021  Patient ID:  Alley, Neils [birth year], MRN [id number] PCP:  [name], DO  Cardiologist:  Dr. Caryl Comes   Chief Complaint: annual visit,  Post hospital   History of Present Illness: MAGALENE MCLEAR is a 81 y.o. female with history of CHB w/PPM, HTN, hypothyroid s/p thyroidectomy, renal cancer/nephrectomy (left), AFib, NSVT, CKD (III)  She comes in today to be seen for Dr. Caryl Comes, last seen by him 07/23/19, she was doing well, planned to do an echo the following year to monitor for PM mediated CM.  BP was up but well controlled at home No changes were made.  She was hospitalized 09/13/20 for symptoms initially of GI sense with stomach upset and loose stools > CP.  GI coctain in the ER improved her sx. cardiology consulted, felt to have atypical symptoms for cardiac etiology though c/o ongoing L arm aching and planned for ischemic w/u that noted no ischemia, cardiology signed off 09/17/20. LVEF 55-60%, grade II DD, mod TR GI suspected GERD Discharged 09/17/20 Her HCTZ d/c 2/2 AKI/CKD, planned for GI follow up  I saw her 09/21/20 She is feeling much better since home, no ongoing GI issues She has a GI visit scheduled, can not recall when, no PMD appt yet. She denies any cardiac concerns or awareness. No CP, palpitations No SOB, DOE No dizziness, near syncope or syncope No bleeding or signs of bleeding No changes were made, AMS were false for PACs and burden <1% Planned for labs and annual visit  06/06/21, ER visit with c/o CP, SOB w/nonproductive cough, abd c/o, 3 weeks of constipation CT: No acute findings in the chest, abdomen or pelvis. (Full findings noted in Epic) Cxr: small L effusion Device check reported with no abnormal findings K+ 3.7 BUN/Creat 18/0.96 BNP 479 HS Trop 8 WBC 4.3 H/H 12/38 Plts 132 Discharged from the ER  She saw her PMD 07/03/21, no discussion on her symptoms or ER visit  TODAY She feels well  again, no ongoing SOB, , CP or cough No palpitations No dizzy spells, near syncope or syncope. She got no meds in the ER has not changed anything at home, but feels well again She has an echo scheduled for next week  She had generalized aches/pains, malaise leading up to her ER visit was surprised that she tested negative for flu/COVID  She is concerned about her BP  Device information: MDT dual chamber PPM, implanted 2004, gen change 09/16/11, Dr. Lovena Le, CH[facility] Dr. Olin Pia note mentions Ventricular failure to pace/polarity switch (notes for years mentions this) Is programmed unipolar DEVICE DEPENDEDNT   Past Medical History:  Diagnosis Date   Atrioventricular block, complete ([place])    a. 08/2011 Upgrade of PPM to MDT Adapta L Dual Chamber PPM ser # [id number].   Cardiac pacemaker in situ    Contact dermatitis and eczema    unspec cause   Dairy product intolerance    Depression    Diastolic dysfunction    GERD (gastroesophageal reflux disease)    Hiatal hernia    History of diverticulitis of colon    2003  perforated diverticulitis w/ surgical intervention   History of kidney stones    age 32   History of peripheral edema    lower extremities   History of thyroid nodule    multinodular goiter  s/p  total thyroidectomy 2014   HTN (hypertension)    Hydronephrosis, left    Hypothyroidism  Mild sleep apnea    per study 04-14-2014   Nocturia    NSVT (nonsustained ventricular tachycardia)    Osteoporosis    PAF (paroxysmal atrial fibrillation) (HCC)    PONV (postoperative nausea and vomiting)    Pulmonary nodules    stable per last ct   Renal cell carcinoma of left kidney ([place])    Superficial thrombophlebitis     Past Surgical History:  Procedure Laterality Date   CARDIOVASCULAR STRESS TEST  04-20-2015  dr Caryl Comes   normal nuclear study/  no ischemia/  normal LV function and wall motion, ef 74%   COLONOSCOPY  01/19/2013   COLOSTOMY TAKEDOWN  07-22-2002   CYSTOSCOPY  WITH RETROGRADE PYELOGRAM, URETEROSCOPY AND STENT PLACEMENT Left 07/28/2015   Procedure: CYSTOSCOPY WITH LEFT RETROGRADE PYELOGRAM, POSSIBLE LEFT URETEROSCOPY AND STENT PLACEMENT;  Surgeon: Franchot Gallo, MD;  Location: [facility];  Service: Urology;  Laterality: Left;   EXPLORATORY LAPARTOMY SIGMOID COLECTOMY/ COLOSTOMY  04-28-2002   perforated diverticulitis   KYPHOPLASTY     LAPAROSCOPY VENTRAL HERNIA REPAIR/  EXTENSIVE LYSIS ADHESIONS  05-04-2004   PACEMAKER GENERATOR CHANGE Left 09/16/2011   MDT ADDRL1 pacemaker   PACEMAKER PLACEMENT  05-11-2003     medtronic   ROBOT ASSISTED LAPAROSCOPIC NEPHRECTOMY Left 03/20/2016   Procedure: XI ROBOTIC ASSISTED LAPAROSCOPIC RETROPERITONEAL NEPHRECTOMY;  Surgeon: Alexis Frock, MD;  Location[facility];  Service: Urology;  Laterality: Left;   THYROIDECTOMY N/A 08/07/2012   Procedure: THYROIDECTOMY;  Surgeon: Earnstine Regal, MD;  Location: [facility];  Service: General;  Laterality: N/A;   TRANSTHORACIC ECHOCARDIOGRAM  10-12-2014   moderate focal basal LVH,  ef 55-60%,  grade 2 diastolic dysfunction, mild AV calcification without stenosis,  mild MR,  trivial PR    Current Outpatient Medications  Medication Sig Dispense Refill   acetaminophen (TYLENOL) 500 MG tablet Take 1,000 mg by mouth daily as needed for moderate pain.     amLODipine (NORVASC) 5 MG tablet Take 1 tablet (5 mg total) by mouth daily. 90 tablet 1   apixaban (ELIQUIS) 5 MG TABS tablet Take 1 tablet (5 mg total) by mouth 2 (two) times daily. 180 tablet 1   denosumab (PROLIA) 60 MG/ML SOSY injection Inject 60 mg into the skin every 6 (six) months.     diclofenac Sodium (VOLTAREN) 1 % GEL APPLY 4 GRAMS ON TO THE SKIN 4 (FOUR) TIMES DAILY. 100 g 2   diphenhydramine-acetaminophen (TYLENOL PM) 25-500 MG TABS tablet Take 2 tablets by mouth at bedtime as needed (sleep/pain).     fluticasone (FLONASE) 50 MCG/ACT nasal spray PLACE 2 SPRAYS INTO BOTH NOSTRILS DAILY AS NEEDED FOR ALLERGIES  OR RHINITIS. 16 g 2   gabapentin (NEURONTIN) 100 MG capsule Take 1 capsule (100 mg total) by mouth 3 (three) times daily. 90 capsule 5   levocetirizine (XYZAL) 5 MG tablet TAKE 1 TABLET (5 MG TOTAL) BY MOUTH EVERY EVENING. 90 tablet 3   levothyroxine (SYNTHROID) 100 MCG tablet Take 1 tablet (100 mcg total) by mouth daily before breakfast. 90 tablet 3   lisinopril (ZESTRIL) 40 MG tablet Take 1 tablet by mouth daily. 90 tablet 1   loratadine (CLARITIN) 10 MG tablet TAKE 1 TABLET (10 MG TOTAL) BY MOUTH DAILY AS NEEDED FOR ALLERGIES. 100 tablet 2   metoprolol tartrate (LOPRESSOR) 25 MG tablet Take 1 tablet by mouth twice daily 180 tablet 0   pantoprazole (PROTONIX) 40 MG tablet Take 1 tablet (40 mg total) by mouth 2 (two) times daily before  a meal. 60 tablet 2   No current facility-administered medications for this visit.   Facility-Administered Medications Ordered in Other Visits  Medication Dose Route Frequency Provider Last Rate Last Admin   aminophylline injection 150 mg  150 mg Intravenous BID PRN Dorothy Spark, MD   150 mg at 04/20/15 1200    Allergies:   Tape and Codeine   Social History:  The patient  reports that she quit smoking about 41 years ago. Her smoking use included cigarettes. She has never used smokeless tobacco. She reports that she does not drink alcohol and does not use drugs.   Family History:  The patient's family history includes Cancer in her son; Diverticulosis in her sister; Heart disease in her brother and mother; Heart failure in her father; Hypertension in her brother, brother, and mother; Stroke in her mother.  ROS:  Please see the history of present illness.  All other systems are reviewed and otherwise negative.   PHYSICAL EXAM:  VS:  There were no vitals taken for this visit. BMI: There is no height or weight on file to calculate BMI. Well nourished, well developed, in no acute distress  HEENT: normocephalic, atraumatic  Neck: no JVD, carotid bruits or  masses Cardiac:  RRR; no significant murmurs, no rubs, or gallops Lungs:  CTA b/l, no wheezing, rhonchi or rales  Abd: soft, nontender MS: no deformity or atrophy Ext: no edema  Skin: warm and dry, no rash Neuro:  No gross deficits appreciated Psych: euthymic mood, full affect  PPM site is stable, no tethering or discomfort   EKG:  Not done today  PPM interrogation done today and reviewed by myself:  Battery and lead measurements are good + AMS ,(2), longest 28min:16sec, burden is <0.1% Dependent today at 40bpm     09/16/20: stress myoview IMPRESSION: 1. Matched reduced activity in the inferior wall is somewhat large, but probably from diaphragmatic attenuation rather than scarring given the reasonable wall motion in this vicinity. 2. Mild apical hypokinesis. 3. Left ventricular ejection fraction 65% 4. Non invasive risk stratification*: Low   09/14/2020; TTE IMPRESSIONS   1. Left ventricular ejection fraction, by estimation, is 55 to 60%. The  left ventricle has normal function. The left ventricle has no regional  wall motion abnormalities. There is mild asymmetric left ventricular  hypertrophy. Left ventricular diastolic  parameters are consistent with Grade II diastolic dysfunction  (pseudonormalization).   2. Right ventricular systolic function is normal. The right ventricular  size is normal. There is normal pulmonary artery systolic pressure.   3. The mitral valve is grossly normal. Mild mitral valve regurgitation.   4. Tricuspid valve regurgitation is mild to moderate.   5. The aortic valve is tricuspid. Aortic valve regurgitation is not  visualized.    10/12/14: TTE Study Conclusions - Left ventricle: The cavity size was normal. There was moderate   focal basal hypertrophy. Systolic function was normal. The   estimated ejection fraction was in the range of 55% to 60%. Wall   motion was normal; there were no regional wall motion   abnormalities. Features are  consistent with a pseudonormal left   ventricular filling pattern, with concomitant abnormal relaxation   and increased filling pressure (grade 2 diastolic dysfunction). - Aortic valve: Mildly calcified annulus. Trileaflet. Thickening.   Transvalvular velocity was within the normal range. There was no   stenosis. There was no regurgitation. - Mitral valve: Transvalvular velocity was within the normal range.   There was no  evidence for stenosis. There was mild regurgitation. - Pulmonic valve: There was trivial regurgitation.  10/16 Myoview normal LV function and no ischemia   Recent Labs: 09/16/2020: Magnesium 1.9 12/20/2020: TSH 1.33 06/06/2021: ALT 14; B Natriuretic Peptide 479.9; BUN 18; Creatinine, Ser 0.96; Hemoglobin 12.4; Platelets 132; Potassium 3.7; Sodium 139  No results found for requested labs within last 8760 hours.   CrCl cannot be calculated (Patient's most recent lab result is older than the maximum 21 days allowed.).   Wt Readings from Last 3 Encounters:  07/03/21 152 lb 6.4 oz (69.1 kg)  06/15/21 153 lb 12.8 oz (69.8 kg)  06/06/21 153 lb (69.4 kg)     Other studies reviewed: Additional studies/records reviewed today include: summarized above  ASSESSMENT AND PLAN:  1. CHB w/PPM     Intact function, no programming changes made  2. New Paroxysmal Afib     CHA2DS2Vasc is 4, on Eliquis, appropriately dosed     <0.1 % burden      3. HTN     Will increase her amlodipine to 10mg  daily  4. CP/SOB Resolved Neg Trop x1, low risk myoview Mach 2022 No ongoing symptoms ? Viral illness preceding symptoms as discussed   Disposition: follow up on her echo once we have it, see her back in a few months, she inquires about seeing Dr. Caryl Comes, plan for her to see him next, sooner if needed  Current medicines are reviewed at length with the patient today.  The patient did not have any concerns regarding medicines.  Haywood Lasso, PA-C 07/11/2021 5:17 AM     [facility] [address] [place] [address] [phone] (office)  [phone] (fax)

## 2021-07-12 ENCOUNTER — Other Ambulatory Visit (HOSPITAL_BASED_OUTPATIENT_CLINIC_OR_DEPARTMENT_OTHER): Payer: Self-pay

## 2021-07-12 ENCOUNTER — Ambulatory Visit (INDEPENDENT_AMBULATORY_CARE_PROVIDER_SITE_OTHER): Payer: Medicare Other | Admitting: Physician Assistant

## 2021-07-12 ENCOUNTER — Encounter: Payer: Self-pay | Admitting: Physician Assistant

## 2021-07-12 ENCOUNTER — Other Ambulatory Visit: Payer: Self-pay

## 2021-07-12 VITALS — BP 162/98 | HR 50 | Ht 63.0 in | Wt 154.0 lb

## 2021-07-12 DIAGNOSIS — Z950 Presence of cardiac pacemaker: Secondary | ICD-10-CM

## 2021-07-12 DIAGNOSIS — I10 Essential (primary) hypertension: Secondary | ICD-10-CM

## 2021-07-12 DIAGNOSIS — I480 Paroxysmal atrial fibrillation: Secondary | ICD-10-CM

## 2021-07-12 DIAGNOSIS — I442 Atrioventricular block, complete: Secondary | ICD-10-CM

## 2021-07-12 DIAGNOSIS — I5032 Chronic diastolic (congestive) heart failure: Secondary | ICD-10-CM

## 2021-07-12 LAB — CUP PACEART INCLINIC DEVICE CHECK
Battery Impedance: 2026 Ohm
Battery Remaining Longevity: 31 mo
Battery Voltage: 2.72 V
Brady Statistic AP VP Percent: 16 %
Brady Statistic AP VS Percent: 0 %
Brady Statistic AS VP Percent: 84 %
Brady Statistic AS VS Percent: 0 %
Date Time Interrogation Session: 20230112192320
Implantable Lead Implant Date: 20041110
Implantable Lead Implant Date: 20041110
Implantable Lead Location: 753859
Implantable Lead Location: 753860
Implantable Pulse Generator Implant Date: 20130318
Lead Channel Impedance Value: 400 Ohm
Lead Channel Impedance Value: 513 Ohm
Lead Channel Pacing Threshold Amplitude: 0.5 V
Lead Channel Pacing Threshold Amplitude: 0.625 V
Lead Channel Pacing Threshold Amplitude: 0.625 V
Lead Channel Pacing Threshold Amplitude: 0.75 V
Lead Channel Pacing Threshold Pulse Width: 0.4 ms
Lead Channel Pacing Threshold Pulse Width: 0.4 ms
Lead Channel Pacing Threshold Pulse Width: 0.4 ms
Lead Channel Pacing Threshold Pulse Width: 0.4 ms
Lead Channel Sensing Intrinsic Amplitude: 4 mV
Lead Channel Setting Pacing Amplitude: 2 V
Lead Channel Setting Pacing Amplitude: 2.5 V
Lead Channel Setting Pacing Pulse Width: 0.4 ms
Lead Channel Setting Sensing Sensitivity: 4 mV

## 2021-07-12 MED ORDER — AMLODIPINE BESYLATE 10 MG PO TABS
10.0000 mg | ORAL_TABLET | Freq: Every day | ORAL | 2 refills | Status: DC
  Filled 2021-07-12: qty 90, 90d supply, fill #0

## 2021-07-12 MED ORDER — AMLODIPINE BESYLATE 10 MG PO TABS: 10.0000 mg | ORAL_TABLET | Freq: Every day | ORAL | 2 refills | Status: DC

## 2021-07-12 NOTE — Patient Instructions (Signed)
Medication Instructions:    START TAKING AMLODIPINE 10 MG ONCE A DAY   *If you need a refill on your cardiac medications before your next appointment, please call your pharmacy*   Lab Work: NONE ORDERED  TODAY    If you have labs (blood work) drawn today and your tests are completely normal, you will receive your results only by: [place] (if you have MyChart) OR A paper copy in the mail If you have any lab test that is abnormal or we need to change your treatment, we will call you to review the results.   Testing/Procedures: NONE ORDERED  TODAY    Follow-Up: At [facility], you and your health needs are our priority.  As part of our continuing mission to provide you with exceptional heart care, we have created designated Provider Care Teams.  These Care Teams include your primary Cardiologist (physician) and Advanced Practice Providers (APPs -  Physician Assistants and Nurse Practitioners) who all work together to provide you with the care you need, when you need it.  We recommend signing up for the patient portal called "MyChart".  Sign up information is provided on this After Visit Summary.  MyChart is used to connect with patients for Virtual Visits (Telemedicine).  Patients are able to view lab/test results, encounter notes, upcoming appointments, etc.  Non-urgent messages can be sent to your provider as well.   To learn more about what you can do with MyChart, go to [email or web address].    Your next appointment:   3 month(s)  The format for your next appointment:   In Person  Provider:   Virl Axe, MD{  OR WITH [place] ON DAY DR Caryl Comes IN OFFICE  Other Instructions

## 2021-07-16 ENCOUNTER — Other Ambulatory Visit (HOSPITAL_BASED_OUTPATIENT_CLINIC_OR_DEPARTMENT_OTHER): Payer: Self-pay

## 2021-07-17 ENCOUNTER — Other Ambulatory Visit (HOSPITAL_BASED_OUTPATIENT_CLINIC_OR_DEPARTMENT_OTHER): Payer: Self-pay

## 2021-07-18 ENCOUNTER — Other Ambulatory Visit: Payer: Self-pay

## 2021-07-18 ENCOUNTER — Ambulatory Visit (HOSPITAL_BASED_OUTPATIENT_CLINIC_OR_DEPARTMENT_OTHER)
Admission: RE | Admit: 2021-07-18 | Discharge: 2021-07-18 | Disposition: A | Discharge status: Home / Self Care | Payer: Medicare Other | Source: Ambulatory Visit | Attending: Family Medicine | Admitting: Family Medicine

## 2021-07-18 DIAGNOSIS — I10 Essential (primary) hypertension: Secondary | ICD-10-CM | POA: Diagnosis present

## 2021-07-18 DIAGNOSIS — R0602 Shortness of breath: Secondary | ICD-10-CM | POA: Diagnosis present

## 2021-07-18 LAB — ECHOCARDIOGRAM COMPLETE
AR max vel: 2.23 cm2
AV Area VTI: 2.13 cm2
AV Area mean vel: 2.25 cm2
AV Mean grad: 4 mmHg
AV Peak grad: 8.4 mmHg
Ao pk vel: 1.45 m/s
Area-P 1/2: 3.65 cm2
S' Lateral: 3.5 cm

## 2021-07-18 NOTE — Progress Notes (Signed)
°  Echocardiogram 2D Echocardiogram has been performed.  [name] F 07/18/2021, 11:35 AM

## 2021-07-23 ENCOUNTER — Encounter: Payer: Self-pay | Admitting: Pulmonary Disease

## 2021-07-23 ENCOUNTER — Encounter: Payer: Self-pay | Admitting: Family Medicine

## 2021-07-23 ENCOUNTER — Ambulatory Visit (INDEPENDENT_AMBULATORY_CARE_PROVIDER_SITE_OTHER): Payer: Medicare Other | Admitting: Pulmonary Disease

## 2021-07-23 ENCOUNTER — Other Ambulatory Visit: Payer: Self-pay

## 2021-07-23 VITALS — BP 124/72 | HR 71 | Temp 97.6°F | Ht 63.0 in | Wt 153.2 lb

## 2021-07-23 DIAGNOSIS — I5032 Chronic diastolic (congestive) heart failure: Secondary | ICD-10-CM

## 2021-07-23 DIAGNOSIS — R0602 Shortness of breath: Secondary | ICD-10-CM

## 2021-07-23 DIAGNOSIS — R911 Solitary pulmonary nodule: Secondary | ICD-10-CM | POA: Diagnosis not present

## 2021-07-23 DIAGNOSIS — I480 Paroxysmal atrial fibrillation: Secondary | ICD-10-CM

## 2021-07-23 NOTE — Patient Instructions (Signed)
Thank you for visiting Dr. Valeta Harms at [facility]. Today we recommend the following:  No additional CT follow up needed.   Return if symptoms worsen or fail to improve.    Please do your part to reduce the spread of COVID-19.

## 2021-07-23 NOTE — Progress Notes (Signed)
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  Several splenic lesions identified that also appeared benign since they have been stable over time.  Patient was also referred for evaluation of shortness of breath.  Such patient states her shortness of breath is much better.  She felt like it was situational to the time that she went to the emergency department when she her blood pressure was systolic in the 242A.  She also CT showed a small amount of pleural fluid on the left side.  She is doing much better now back to her regular routine.  Prior to her ER visit she had she had been feeling pretty lousy further approximately 2 weeks prior.   Past Medical History:  Diagnosis Date   Atrioventricular block, complete ([place])    a. 08/2011 Upgrade of PPM to MDT Adapta L Dual Chamber PPM ser # [id number].   Cardiac pacemaker in situ    Contact dermatitis and eczema    unspec cause   Dairy product intolerance    Depression    Diastolic dysfunction    GERD (gastroesophageal reflux disease)    Hiatal hernia    History of diverticulitis of colon    2003  perforated diverticulitis w/ surgical intervention   History of kidney stones    age 60   History of peripheral edema    lower extremities   History of thyroid nodule    multinodular goiter  s/p  total thyroidectomy 2014   HTN (hypertension)     Hydronephrosis, left    Hypothyroidism    Mild sleep apnea    per study 04-14-2014   Nocturia    NSVT (nonsustained ventricular tachycardia)    Osteoporosis    PAF (paroxysmal atrial fibrillation) (HCC)    PONV (postoperative nausea and vomiting)    Pulmonary nodules    stable per last ct   Renal cell carcinoma of left kidney (HCC)    Superficial thrombophlebitis      Family History  Problem Relation Age of Onset   Heart disease Mother        Stent in her 68s   Stroke Mother    Hypertension Mother    Heart failure Father    Hypertension Brother    Heart disease Brother    Diverticulosis Sister    Hypertension Brother    Cancer Son        breast   Heart attack Neg Hx      Past Surgical History:  Procedure Laterality Date   CARDIOVASCULAR STRESS TEST  04-20-2015  dr Caryl Comes   normal nuclear study/  no ischemia/  normal LV function and wall motion, ef 74%   COLONOSCOPY  01/19/2013   COLOSTOMY TAKEDOWN  07-22-2002   CYSTOSCOPY WITH RETROGRADE PYELOGRAM, URETEROSCOPY AND STENT PLACEMENT Left 07/28/2015   Procedure: CYSTOSCOPY WITH LEFT RETROGRADE PYELOGRAM, POSSIBLE LEFT URETEROSCOPY AND  STENT PLACEMENT;  Surgeon: Franchot Gallo, MD;  Location: [facility];  Service: Urology;  Laterality: Left;   EXPLORATORY LAPARTOMY SIGMOID COLECTOMY/ COLOSTOMY  04-28-2002   perforated diverticulitis   KYPHOPLASTY     LAPAROSCOPY VENTRAL HERNIA REPAIR/  EXTENSIVE LYSIS ADHESIONS  05-04-2004   PACEMAKER GENERATOR CHANGE Left 09/16/2011   MDT ADDRL1 pacemaker   PACEMAKER PLACEMENT  05-11-2003     medtronic   ROBOT ASSISTED LAPAROSCOPIC NEPHRECTOMY Left 03/20/2016   Procedure: XI ROBOTIC ASSISTED LAPAROSCOPIC RETROPERITONEAL NEPHRECTOMY;  Surgeon: Alexis Frock, MD;  Location: [facility];  Service: Urology;  Laterality: Left;   THYROIDECTOMY N/A 08/07/2012   Procedure: THYROIDECTOMY;  Surgeon: Earnstine Regal, MD;  Location: [facility];  Service: General;  Laterality: N/A;    TRANSTHORACIC ECHOCARDIOGRAM  10-12-2014   moderate focal basal LVH,  ef 55-60%,  grade 2 diastolic dysfunction, mild AV calcification without stenosis,  mild MR,  trivial PR    Social History   Socioeconomic History   Marital status: Single    Spouse name: Not on file   Number of children: 2   Years of education: 16   Highest education level: Not on file  Occupational History   Occupation: Retired Production assistant, 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  She was married for 22 years, divorced, and has remained single. She has 1 grown son, 1 grown daughter.  She has 3 grandchildren, 2 of her children   live in [place].  She is retired.  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 She had a long term companion, 32 years, who passed away 2010-09-27.   Social Determinants of Health   Financial Resource Strain: Low Risk    Difficulty of Paying Living Expenses: Not hard at all  Food Insecurity: No Food Insecurity   Worried About Charity fundraiser in the Last Year: Never true   [place] in the Last Year: Never true  Transportation Needs: No Transportation Needs   Lack of Transportation (Medical): No   Lack of Transportation (Non-Medical): No  Physical Activity: Insufficiently Active   Days of Exercise per Week: 4 days   Minutes of Exercise per Session: 30 min  Stress: No Stress Concern Present   Feeling of Stress : Not at all  Social Connections: Socially Isolated   Frequency of  Communication with Friends and Family: More than three times a week   Frequency of Social Gatherings with Friends and Family: More than three times a week   Attends Religious Services: Never   Marine scientist or Organizations: No   Attends Archivist Meetings: Never   Marital Status: Divorced  Human resources officer Violence: Not At Risk   Fear of Current or Ex-Partner: No   Emotionally Abused: No   Physically Abused: No   Sexually Abused: No  Allergies  Allergen Reactions   Tape Rash   Codeine Nausea And Vomiting     Outpatient Medications Prior to Visit  Medication Sig Dispense Refill   acetaminophen (TYLENOL) 500 MG tablet Take 1,000 mg by mouth daily as needed for moderate pain.     amLODipine (NORVASC) 10 MG tablet Take 1 tablet (10 mg total) by mouth daily. 90 tablet 2   apixaban (ELIQUIS) 5 MG TABS tablet Take 1 tablet (5 mg total) by mouth 2 (two) times daily. 180 tablet 1   denosumab (PROLIA) 60 MG/ML SOSY injection Inject 60 mg into the skin every 6 (six) months.     diclofenac Sodium (VOLTAREN) 1 % GEL APPLY 4 GRAMS ON TO THE SKIN 4 (FOUR) TIMES DAILY. 100 g 2   diphenhydramine-acetaminophen (TYLENOL PM) 25-500 MG TABS tablet Take 2 tablets by mouth at bedtime as needed (sleep/pain).     gabapentin (NEURONTIN) 100 MG capsule Take 1 capsule (100 mg total) by mouth 3 (three) times daily. 90 capsule 5   levocetirizine (XYZAL) 5 MG tablet Take 1 tablet (5 mg total) by mouth every evening. 90 tablet 3   levothyroxine (SYNTHROID) 100 MCG tablet Take 1 tablet (100 mcg total) by mouth daily before breakfast. 90 tablet 3   lisinopril (ZESTRIL) 40 MG tablet Take 1 tablet (40 mg total) by mouth daily. 90 tablet 1   metoprolol tartrate (LOPRESSOR) 25 MG tablet Take 1 tablet by mouth twice daily 180 tablet 0   pantoprazole (PROTONIX) 40 MG tablet Take 1 tablet (40 mg total) by mouth 2 (two) times daily before a meal. 60 tablet 2   fluticasone (FLONASE) 50 MCG/ACT nasal  spray PLACE 2 SPRAYS INTO BOTH NOSTRILS DAILY AS NEEDED FOR ALLERGIES OR RHINITIS. 16 g 2   loratadine (CLARITIN) 10 MG tablet TAKE 1 TABLET (10 MG TOTAL) BY MOUTH DAILY AS NEEDED FOR ALLERGIES. 100 tablet 2   Facility-Administered Medications Prior to Visit  Medication Dose Route Frequency Provider Last Rate Last Admin   aminophylline injection 150 mg  150 mg Intravenous BID PRN Dorothy Spark, MD   150 mg at 04/20/15 1200    Review of Systems  Constitutional:  Negative for chills, fever, malaise/fatigue and weight loss.  HENT:  Negative for hearing loss, sore throat and tinnitus.   Eyes:  Negative for blurred vision and double vision.  Respiratory:  Negative for cough, hemoptysis, sputum production, shortness of breath, wheezing and stridor.   Cardiovascular:  Negative for chest pain, palpitations, orthopnea, leg swelling and PND.  Gastrointestinal:  Negative for abdominal pain, constipation, diarrhea, heartburn, nausea and vomiting.  Genitourinary:  Negative for dysuria, hematuria and urgency.  Musculoskeletal:  Negative for joint pain and myalgias.  Skin:  Negative for itching and rash.  Neurological:  Negative for dizziness, tingling, weakness and headaches.  Endo/Heme/Allergies:  Negative for environmental allergies. Does not bruise/bleed easily.  Psychiatric/Behavioral:  Negative for depression. The patient is not nervous/anxious and does not have insomnia.   All other systems reviewed and are negative.   Objective:  Physical Exam Vitals reviewed.  Constitutional:      General: She is not in acute distress.    Appearance: She is well-developed.  HENT:     Head: Normocephalic and atraumatic.  Eyes:     General: No scleral icterus.    Conjunctiva/sclera: Conjunctivae normal.     Pupils: Pupils are equal, round, and reactive to light.  Neck:     Vascular: No JVD.  Trachea: No tracheal deviation.  Cardiovascular:     Rate and Rhythm: Normal rate and regular rhythm.      Heart sounds: Normal heart sounds. No murmur heard. Pulmonary:     Effort: Pulmonary effort is normal. No tachypnea, accessory muscle usage or respiratory distress.     Breath sounds: No stridor. No wheezing, rhonchi or rales.  Abdominal:     General: There is no distension.     Palpations: Abdomen is soft.     Tenderness: There is no abdominal tenderness.  Musculoskeletal:        General: No tenderness.     Cervical back: Neck supple.  Lymphadenopathy:     Cervical: No cervical adenopathy.  Skin:    General: Skin is warm and dry.     Capillary Refill: Capillary refill takes less than 2 seconds.     Findings: No rash.  Neurological:     Mental Status: She is alert and oriented to person, place, and time.  Psychiatric:        Behavior: Behavior normal.     Vitals:   07/23/21 1038  BP: 124/72  Pulse: 71  Temp: 97.6 F (36.4 C)  TempSrc: Oral  SpO2: 100%  Weight: 153 lb 3.2 oz (69.5 kg)  Height: 5\' 3"  (1.6 m)   100% on RA BMI Readings from Last 3 Encounters:  07/23/21 27.14 kg/m  07/12/21 27.28 kg/m  07/03/21 27.00 kg/m   Wt Readings from Last 3 Encounters:  07/23/21 153 lb 3.2 oz (69.5 kg)  07/12/21 154 lb (69.9 kg)  07/03/21 152 lb 6.4 oz (69.1 kg)     CBC    Component Value Date/Time   WBC 4.3 06/06/2021 1013   RBC 4.51 06/06/2021 1013   HGB 12.4 06/06/2021 1013   HGB 13.5 07/23/2019 1411   HCT 38.1 06/06/2021 1013   HCT 40.8 07/23/2019 1411   PLT 132 (L) 06/06/2021 1013   PLT 178 07/23/2019 1411   MCV 84.5 06/06/2021 1013   MCV 85 07/23/2019 1411   MCH 27.5 06/06/2021 1013   MCHC 32.5 06/06/2021 1013   RDW 12.7 06/06/2021 1013   RDW 12.3 07/23/2019 1411   LYMPHSABS 0.8 06/06/2021 1013   MONOABS 0.5 06/06/2021 1013   EOSABS 0.1 06/06/2021 1013   BASOSABS 0.0 06/06/2021 1013     Chest Imaging:  CT scan of the chest 06/06/2021: Small left-sided pleural effusion, pulmonary nodule on the lateral pleural surface of the right middle lobe  stable since 2019 CT imaging. The patient's images have been independently reviewed by me.     Pulmonary Functions Testing Results: PFT Results Latest Ref Rng & Units 07/13/2015  FVC-Pre L 2.81  FVC-Predicted Pre % 131  FVC-Post L 2.80  FVC-Predicted Post % 131  Pre FEV1/FVC % % 67  Post FEV1/FCV % % 72  FEV1-Pre L 1.88  FEV1-Predicted Pre % 114  FEV1-Post L 2.02  DLCO uncorrected ml/min/mmHg 15.10  DLCO UNC% % 62  DLVA Predicted % 73  TLC L 4.93  TLC % Predicted % 97  RV % Predicted % 94    FeNO:   Pathology:   Echocardiogram:   Heart Catheterization:     Assessment & Plan:     ICD-10-CM   1. Pulmonary nodule  R91.1     2. Chronic diastolic heart failure (HCC)  I50.32     3. Paroxysmal atrial fibrillation (HCC)  I48.0     4. SOB (shortness of breath)  R06.02  Discussion:  This is an 81 year old female seen today for evaluation of abnormal CT imaging in December 2022.  She has a stable pulmonary nodules in the right side that have been there since 2019.  I reviewed the images with the patient today.  I do not think that she is needs any additional follow-up for this.  She did have a former history of occasional cigarette use for many many years ago.  Sounds like most of her shortness of breath was related to her blood pressure and diastolic heart failure issues.  This has since resolved.  Plan: I think she can follow-up with [place] as needed. I do think she needs any additional CT imaging regarding her nodules. Continue to follow-up with primary care and cardiology regarding blood pressure and diastolic heart failure management. Patient to see [place] as needed.  She can give [place] a call if any of her respiratory symptoms change and we can be of any assistance.  We appreciate the referral.   Current Outpatient Medications:    acetaminophen (TYLENOL) 500 MG tablet, Take 1,000 mg by mouth daily as needed for moderate pain., Disp: , Rfl:    amLODipine (NORVASC) 10 MG  tablet, Take 1 tablet (10 mg total) by mouth daily., Disp: 90 tablet, Rfl: 2   apixaban (ELIQUIS) 5 MG TABS tablet, Take 1 tablet (5 mg total) by mouth 2 (two) times daily., Disp: 180 tablet, Rfl: 1   denosumab (PROLIA) 60 MG/ML SOSY injection, Inject 60 mg into the skin every 6 (six) months., Disp: , Rfl:    diclofenac Sodium (VOLTAREN) 1 % GEL, APPLY 4 GRAMS ON TO THE SKIN 4 (FOUR) TIMES DAILY., Disp: 100 g, Rfl: 2   diphenhydramine-acetaminophen (TYLENOL PM) 25-500 MG TABS tablet, Take 2 tablets by mouth at bedtime as needed (sleep/pain)., Disp: , Rfl:    gabapentin (NEURONTIN) 100 MG capsule, Take 1 capsule (100 mg total) by mouth 3 (three) times daily., Disp: 90 capsule, Rfl: 5   levocetirizine (XYZAL) 5 MG tablet, Take 1 tablet (5 mg total) by mouth every evening., Disp: 90 tablet, Rfl: 3   levothyroxine (SYNTHROID) 100 MCG tablet, Take 1 tablet (100 mcg total) by mouth daily before breakfast., Disp: 90 tablet, Rfl: 3   lisinopril (ZESTRIL) 40 MG tablet, Take 1 tablet (40 mg total) by mouth daily., Disp: 90 tablet, Rfl: 1   metoprolol tartrate (LOPRESSOR) 25 MG tablet, Take 1 tablet by mouth twice daily, Disp: 180 tablet, Rfl: 0   pantoprazole (PROTONIX) 40 MG tablet, Take 1 tablet (40 mg total) by mouth 2 (two) times daily before a meal., Disp: 60 tablet, Rfl: 2   fluticasone (FLONASE) 50 MCG/ACT nasal spray, PLACE 2 SPRAYS INTO BOTH NOSTRILS DAILY AS NEEDED FOR ALLERGIES OR RHINITIS., Disp: 16 g, Rfl: 2   loratadine (CLARITIN) 10 MG tablet, TAKE 1 TABLET (10 MG TOTAL) BY MOUTH DAILY AS NEEDED FOR ALLERGIES., Disp: 100 tablet, Rfl: 2 No current facility-administered medications for this visit.  Facility-Administered Medications Ordered in Other Visits:    aminophylline injection 150 mg, 150 mg, Intravenous, BID PRN, Dorothy Spark, MD, 150 mg at 04/20/15 [place], DO [facility] 07/23/2021 10:43 AM

## 2021-07-23 NOTE — Telephone Encounter (Signed)
Pt viewed results via Mychart

## 2021-08-08 ENCOUNTER — Ambulatory Visit (INDEPENDENT_AMBULATORY_CARE_PROVIDER_SITE_OTHER): Payer: Medicare Other

## 2021-08-08 DIAGNOSIS — Z950 Presence of cardiac pacemaker: Secondary | ICD-10-CM

## 2021-08-10 LAB — CUP PACEART REMOTE DEVICE CHECK
Battery Impedance: 2151 Ohm
Battery Remaining Longevity: 30 mo
Battery Voltage: 2.73 V
Brady Statistic AP VP Percent: 22 %
Brady Statistic AP VS Percent: 0 %
Brady Statistic AS VP Percent: 78 %
Brady Statistic AS VS Percent: 0 %
Date Time Interrogation Session: 20230209123454
Implantable Lead Implant Date: 20041110
Implantable Lead Implant Date: 20041110
Implantable Lead Location: 753859
Implantable Lead Location: 753860
Implantable Pulse Generator Implant Date: 20130318
Lead Channel Impedance Value: 417 Ohm
Lead Channel Impedance Value: 530 Ohm
Lead Channel Pacing Threshold Amplitude: 0.625 V
Lead Channel Pacing Threshold Amplitude: 0.75 V
Lead Channel Pacing Threshold Pulse Width: 0.4 ms
Lead Channel Pacing Threshold Pulse Width: 0.4 ms
Lead Channel Setting Pacing Amplitude: 2 V
Lead Channel Setting Pacing Amplitude: 2.5 V
Lead Channel Setting Pacing Pulse Width: 0.4 ms
Lead Channel Setting Sensing Sensitivity: 4 mV

## 2021-08-13 NOTE — Progress Notes (Signed)
Remote pacemaker transmission.   

## 2021-08-22 NOTE — Telephone Encounter (Signed)
Last Prolia inj 06/29/21 Next Prolia inj due 12/29/21

## 2021-08-27 ENCOUNTER — Other Ambulatory Visit (HOSPITAL_BASED_OUTPATIENT_CLINIC_OR_DEPARTMENT_OTHER): Payer: Self-pay

## 2021-08-27 ENCOUNTER — Ambulatory Visit (INDEPENDENT_AMBULATORY_CARE_PROVIDER_SITE_OTHER): Payer: Medicare Other | Admitting: Family Medicine

## 2021-08-27 VITALS — BP 130/82 | HR 61 | Temp 97.6°F | Resp 18 | Ht 63.0 in | Wt 152.2 lb

## 2021-08-27 DIAGNOSIS — Z889 Allergy status to unspecified drugs, medicaments and biological substances status: Secondary | ICD-10-CM | POA: Diagnosis not present

## 2021-08-27 DIAGNOSIS — R519 Headache, unspecified: Secondary | ICD-10-CM | POA: Diagnosis not present

## 2021-08-27 DIAGNOSIS — H6501 Acute serous otitis media, right ear: Secondary | ICD-10-CM | POA: Diagnosis not present

## 2021-08-27 MED ORDER — FLUTICASONE PROPIONATE 50 MCG/ACT NA SUSP
NASAL | 2 refills | Status: DC
  Filled 2021-08-27: qty 16, 30d supply, fill #0
  Filled 2022-01-12: qty 16, 30d supply, fill #1
  Filled 2022-01-30: qty 16, 30d supply, fill #2

## 2021-08-27 MED ORDER — AZELASTINE HCL 0.1 % NA SOLN
1.0000 | Freq: Two times a day (BID) | NASAL | 12 refills | Status: DC
  Filled 2021-08-27: qty 30, 30d supply, fill #0

## 2021-08-27 NOTE — Progress Notes (Signed)
Subjective:   By signing my name below, I, [name], attest that this documentation has been prepared under the direction and in the presence of Dr. Roma Schanz, DO. 08/27/2021   Patient ID: [name], female    DOB: 07/21/1940, 81 y.o.   MRN: [id number]  Chief Complaint  Patient presents with   Ear Pain    X2 1/2 weeks, right ear, pt states it feels full and pain. Pt states having radiating pain up to the temple and into the neck and having headaches    HPI Patient is in today for a office visit.  She complains of right ear pain that radiates down into her neck for the at least the past 2 weeks. She is also experiencing headaches that comes and goes. She does not think her symptoms correlate with the weather change. She denies having new congestion symptoms, fevers, left ear pain, pressure in ear. She is taking OTC tylenol, coricidin, Flonase daily, xyzal daily PO, sinus medication and finds relief while using them.    Past Medical History:  Diagnosis Date   Atrioventricular block, complete ([place])    a. 08/2011 Upgrade of PPM to MDT Adapta L Dual Chamber PPM ser # [id number].   Cardiac pacemaker in situ    Contact dermatitis and eczema    unspec cause   Dairy product intolerance    Depression    Diastolic dysfunction    GERD (gastroesophageal reflux disease)    Hiatal hernia    History of diverticulitis of colon    2003  perforated diverticulitis w/ surgical intervention   History of kidney stones    age 24   History of peripheral edema    lower extremities   History of thyroid nodule    multinodular goiter  s/p  total thyroidectomy 2014   HTN (hypertension)    Hydronephrosis, left    Hypothyroidism    Mild sleep apnea    per study 04-14-2014   Nocturia    NSVT (nonsustained ventricular tachycardia)    Osteoporosis    PAF (paroxysmal atrial fibrillation) (HCC)    PONV (postoperative nausea and vomiting)    Pulmonary nodules    stable per last ct    Renal cell carcinoma of left kidney ([place])    Superficial thrombophlebitis     Past Surgical History:  Procedure Laterality Date   CARDIOVASCULAR STRESS TEST  04-20-2015  dr Caryl Comes   normal nuclear study/  no ischemia/  normal LV function and wall motion, ef 74%   COLONOSCOPY  01/19/2013   COLOSTOMY TAKEDOWN  07-22-2002   CYSTOSCOPY WITH RETROGRADE PYELOGRAM, URETEROSCOPY AND STENT PLACEMENT Left 07/28/2015   Procedure: CYSTOSCOPY WITH LEFT RETROGRADE PYELOGRAM, POSSIBLE LEFT URETEROSCOPY AND STENT PLACEMENT;  Surgeon: Franchot Gallo, MD;  Location: [facility];  Service: Urology;  Laterality: Left;   EXPLORATORY LAPARTOMY SIGMOID COLECTOMY/ COLOSTOMY  04-28-2002   perforated diverticulitis   KYPHOPLASTY     LAPAROSCOPY VENTRAL HERNIA REPAIR/  EXTENSIVE LYSIS ADHESIONS  05-04-2004   PACEMAKER GENERATOR CHANGE Left 09/16/2011   MDT ADDRL1 pacemaker   PACEMAKER PLACEMENT  05-11-2003     medtronic   ROBOT ASSISTED LAPAROSCOPIC NEPHRECTOMY Left 03/20/2016   Procedure: XI ROBOTIC ASSISTED LAPAROSCOPIC RETROPERITONEAL NEPHRECTOMY;  Surgeon: Alexis Frock, MD;  Location: [facility];  Service: Urology;  Laterality: Left;   THYROIDECTOMY N/A 08/07/2012   Procedure: THYROIDECTOMY;  Surgeon: Earnstine Regal, MD;  Location: [facility];  Service: General;  Laterality: N/A;   TRANSTHORACIC ECHOCARDIOGRAM  10-12-2014   moderate focal basal LVH,  ef 55-60%,  grade 2 diastolic dysfunction, mild AV calcification without stenosis,  mild MR,  trivial PR    Family History  Problem Relation Age of Onset   Heart disease Mother        Stent in her 36s   Stroke Mother    Hypertension Mother    Heart failure Father    Diverticulosis Sister    Hypertension Brother    Heart disease Brother    Hypertension Brother    Heart attack Neg Hx     Social History   Socioeconomic History   Marital status: Single    Spouse name: Not on file   Number of children: 2   Years of education: 16   Highest  education level: Not on file  Occupational History   Occupation: Retired Production assistant, radio for the [employer]    Employer: RETIRED  Tobacco Use   Smoking status: Former    Years: 15.00    Types: Cigarettes    Quit date: 10/06/1979    Years since quitting: 41.9   Smokeless tobacco: Never  Vaping Use   Vaping Use: Never used  Substance and Sexual Activity   Alcohol use: No   Drug use: No   Sexual activity: Not on file  Other Topics Concern   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  She was married for 22 years, divorced, and has remained single. She has 1 grown son, 1 grown daughter.  She has 3 grandchildren, 2 of her children   live in [place].  She is retired.  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 She had a long term companion, 32 years, who passed away August 23, 2010.   Social Determinants of Health   Financial Resource Strain: Low Risk    Difficulty of Paying Living Expenses: Not hard at all  Food Insecurity: No Food Insecurity   Worried About Charity fundraiser in the Last Year: Never true   [place] in the Last Year: Never true  Transportation Needs: No Transportation Needs   Lack of Transportation (Medical): No   Lack of Transportation (Non-Medical): No  Physical Activity: Insufficiently Active   Days of Exercise per Week: 4 days   Minutes of Exercise per Session: 30 min  Stress: No Stress Concern Present   Feeling of Stress : Not at all  Social Connections: Socially Isolated   Frequency of Communication with Friends and Family: More than three times a week   Frequency of Social Gatherings with Friends and Family: More than three times a week   Attends Religious Services: Never   Marine scientist or Organizations: No   Attends Music therapist: Never   Marital Status: Divorced  Human resources officer Violence:  Not At Risk   Fear of Current or Ex-Partner: No   Emotionally Abused: No   Physically Abused: No   Sexually Abused: No    Outpatient Medications Prior to Visit  Medication Sig Dispense Refill   acetaminophen (TYLENOL) 500 MG tablet Take 1,000 mg by mouth daily as needed for moderate pain.     amLODipine (NORVASC) 10 MG tablet Take 1 tablet (10 mg total) by mouth daily. 90 tablet 2   apixaban (ELIQUIS) 5 MG TABS tablet Take 1 tablet (5  mg total) by mouth 2 (two) times daily. 180 tablet 1   denosumab (PROLIA) 60 MG/ML SOSY injection Inject 60 mg into the skin every 6 (six) months.     diclofenac Sodium (VOLTAREN) 1 % GEL APPLY 4 GRAMS ON TO THE SKIN 4 (FOUR) TIMES DAILY. 100 g 2   diphenhydramine-acetaminophen (TYLENOL PM) 25-500 MG TABS tablet Take 2 tablets by mouth at bedtime as needed (sleep/pain).     gabapentin (NEURONTIN) 100 MG capsule Take 1 capsule (100 mg total) by mouth 3 (three) times daily. 90 capsule 5   levocetirizine (XYZAL) 5 MG tablet Take 1 tablet (5 mg total) by mouth every evening. 90 tablet 3   levothyroxine (SYNTHROID) 100 MCG tablet Take 1 tablet (100 mcg total) by mouth daily before breakfast. 90 tablet 3   lisinopril (ZESTRIL) 40 MG tablet Take 1 tablet (40 mg total) by mouth daily. 90 tablet 1   metoprolol tartrate (LOPRESSOR) 25 MG tablet Take 1 tablet by mouth twice daily 180 tablet 0   pantoprazole (PROTONIX) 40 MG tablet Take 1 tablet (40 mg total) by mouth 2 (two) times daily before a meal. 60 tablet 2   loratadine (CLARITIN) 10 MG tablet TAKE 1 TABLET (10 MG TOTAL) BY MOUTH DAILY AS NEEDED FOR ALLERGIES. 100 tablet 2   fluticasone (FLONASE) 50 MCG/ACT nasal spray PLACE 2 SPRAYS INTO BOTH NOSTRILS DAILY AS NEEDED FOR ALLERGIES OR RHINITIS. 16 g 2   Facility-Administered Medications Prior to Visit  Medication Dose Route Frequency Provider Last Rate Last Admin   aminophylline injection 150 mg  150 mg Intravenous BID PRN Dorothy Spark, MD   150 mg at  04/20/15 1200    Allergies  Allergen Reactions   Tape Rash   Codeine Nausea And Vomiting    Review of Systems  Constitutional:  Negative for fever.  HENT:  Positive for ear pain (right ear). Negative for congestion.        (-)pressure in right ear (-)Left ear pain  Musculoskeletal:  Positive for neck pain (radiates from right ear pain).  Neurological:  Positive for headaches.      Objective:    Physical Exam Constitutional:      General: She is not in acute distress.    Appearance: Normal appearance. She is not ill-appearing.  HENT:     Head: Normocephalic and atraumatic.     Right Ear: Ear canal and external ear normal. Tympanic membrane is bulging.     Left Ear: Tympanic membrane, ear canal and external ear normal.  Eyes:     Extraocular Movements: Extraocular movements intact.     Pupils: Pupils are equal, round, and reactive to light.  Cardiovascular:     Rate and Rhythm: Normal rate and regular rhythm.     Heart sounds: Normal heart sounds. No murmur heard.   No gallop.  Pulmonary:     Effort: Pulmonary effort is normal. No respiratory distress.     Breath sounds: Normal breath sounds. No wheezing or rales.  Skin:    General: Skin is warm and dry.  Neurological:     Mental Status: She is alert and oriented to person, place, and time.  Psychiatric:        Behavior: Behavior normal.    BP 130/82 (BP Location: Left Arm, Patient Position: Sitting, Cuff Size: Normal)    Pulse 61    Temp 97.6 F (36.4 C) (Oral)    Resp 18    Ht _0  (1.6 m)  Wt 152 lb 3.2 oz (69 kg)    SpO2 99%    BMI 26.96 kg/m  Wt Readings from Last 3 Encounters:  08/27/21 152 lb 3.2 oz (69 kg)  07/23/21 153 lb 3.2 oz (69.5 kg)  07/12/21 154 lb (69.9 kg)    Diabetic Foot Exam - Simple   No data filed    Lab Results  Component Value Date   WBC 5.0 08/27/2021   HGB 12.9 08/27/2021   HCT 38.8 08/27/2021   PLT 154.0 08/27/2021   GLUCOSE 92 08/27/2021   CHOL 175 07/12/2019   TRIG 98.0  07/12/2019   HDL 49.90 07/12/2019   LDLCALC 106 (H) 07/12/2019   ALT 13 08/27/2021   AST 23 08/27/2021   NA 136 08/27/2021   K 5.0 08/27/2021   CL 99 08/27/2021   CREATININE 1.20 08/27/2021   BUN 26 (H) 08/27/2021   CO2 29 08/27/2021   TSH 1.33 12/20/2020   INR 1.01 11/04/2013   HGBA1C 5.5 06/07/2016    Lab Results  Component Value Date   TSH 1.33 12/20/2020   Lab Results  Component Value Date   WBC 5.0 08/27/2021   HGB 12.9 08/27/2021   HCT 38.8 08/27/2021   MCV 83.1 08/27/2021   PLT 154.0 08/27/2021   Lab Results  Component Value Date   NA 136 08/27/2021   K 5.0 08/27/2021   CO2 29 08/27/2021   GLUCOSE 92 08/27/2021   BUN 26 (H) 08/27/2021   CREATININE 1.20 08/27/2021   BILITOT 0.7 08/27/2021   ALKPHOS 47 08/27/2021   AST 23 08/27/2021   ALT 13 08/27/2021   PROT 7.2 08/27/2021   ALBUMIN 4.7 08/27/2021   CALCIUM 9.8 08/27/2021   ANIONGAP 11 06/06/2021   EGFR 49 (L) 09/29/2020   GFR 41.99 (L) 08/27/2021   Lab Results  Component Value Date   CHOL 175 07/12/2019   Lab Results  Component Value Date   HDL 49.90 07/12/2019   Lab Results  Component Value Date   LDLCALC 106 (H) 07/12/2019   Lab Results  Component Value Date   TRIG 98.0 07/12/2019   Lab Results  Component Value Date   CHOLHDL 4 07/12/2019   Lab Results  Component Value Date   HGBA1C 5.5 06/07/2016       Assessment & Plan:   Problem List Items Addressed This Visit       Unprioritized   H/O seasonal allergies   Relevant Medications   fluticasone (FLONASE) 50 MCG/ACT nasal spray   Non-recurrent acute serous otitis media of right ear - Primary    Antihistamine and flonase  Add astelin F/u prn       Relevant Medications   azelastine (ASTELIN) 0.1 % nasal spray   Other Visit Diagnoses     New onset headache       Relevant Orders   CT HEAD WO CONTRAST (5MM)   CBC with Differential/Platelet (Completed)   Comprehensive metabolic panel (Completed)   Sedimentation rate  (Completed)        Meds ordered this encounter  Medications   azelastine (ASTELIN) 0.1 % nasal spray    Sig: Place 1 spray into both nostrils 2 (two) times daily. Use in each nostril as directed    Dispense:  30 mL    Refill:  12   fluticasone (FLONASE) 50 MCG/ACT nasal spray    Sig: PLACE 2 SPRAYS INTO BOTH NOSTRILS DAILY AS NEEDED FOR ALLERGIES OR RHINITIS.    Dispense:  16 g  Refill:  2    I, Dr. Roma Schanz, DO, personally preformed the services described in this documentation.  All medical record entries made by the scribe were at my direction and in my presence.  I have reviewed the chart and discharge instructions (if applicable) and agree that the record reflects my personal performance and is accurate and complete. 08/27/2021   I,[name],acting as a scribe for [name], DO.,have documented all relevant documentation on the behalf of [name], DO,as directed by  [name], DO while in the presence of [name], DO.   [name], DO

## 2021-08-27 NOTE — Patient Instructions (Signed)
Otitis Media With Effusion, Adult Otitis media with effusion (OME) is inflammation and fluid (effusion) in the middle ear without having an ear infection. The middle ear is the space behind the eardrum. The middle ear is connected to the back of the throat by a narrow tube (eustachian tube). Normally the eustachian tube drains fluid out of the middle ear. A swollen eustachian tube can become blocked and cause fluid to collect in the middle ear. OME often goes away without treatment. Sometimes OME can lead to hearing problems and recurrent acute ear infections (acute otitis media). These conditions may require treatment. What are the causes? OME is caused by a blocked eustachian tube. This can result from: Allergies. Upper respiratory infections. Enlarged adenoids. The adenoids are areas of soft tissue located high in the back of the throat, behind the nose and the roof of the mouth. They are part of the body's natural defense system (immune system). Rapid changes in pressure, like when an airplane is descending or during scuba diving. In some cases, the cause of this condition is not known. What are the signs or symptoms? Common symptoms of this condition include: A feeling of fullness in your ear. Decreased hearing in the affected ear. Fluid draining into the ear canal. Pain in the ear. In some cases, there are no symptoms. How is this diagnosed? A health care provider can diagnose OME based on signs and symptoms of the condition. Your provider will also do a physical exam to check for fluid behind the eardrum. During the exam, your health care provider will use an instrument called an otoscope to look in your ear. Your health care provider may do other tests, such as: A hearing test. A tympanogram. This is a test that shows how well the eardrum moves in response to air pressure in the ear canal. It provides a graph for your health care provider to review. A pneumatic otoscopy. This is a test  to check how your eardrum moves in response to changes in pressure. It is done by squeezing a small amount of air into the ear. How is this treated? Treatment for OME depends on the cause of the condition and the severity of symptoms. The first step is often waiting to see if the fluid drains on its own in a few weeks. Home care treatment may include: Over-the-counter pain relievers. A warm, moist cloth placed over the ear. Severe cases may require a procedure to insert tubes in the ears (tympanostomy tubes) to drain the fluid. Follow these instructions at home: Take over-the-counter and prescription medicines only as told by your health care provider. Keep all follow-up visits. Contact a health care provider if: You have pain that gets worse. Hearing in your affected ear gets worse. You have fluid draining from your ear canal. You have dizziness. You develop a fever. Get help right away if: You develop a severe headache. You completely lose hearing in the affected ear. You have bleeding from your ear canal. You have sudden and severe pain in your ear. These symptoms may represent a serious problem that is an emergency. Do not wait to see if the symptoms will go away. Get medical help right away. Call your local emergency services (911 in the [place] Do not drive yourself to the hospital. Summary Otitis media with effusion (OME) is inflammation and fluid (effusion) in the middle ear without having an ear infection. A swollen eustachian tube can become blocked and cause fluid to collect in the middle ear.  Treatment for OME depends on the cause of the condition and the severity of symptoms. Many times, treatment is not needed because the fluid drains on its own in a few weeks. Sometimes OME can lead to hearing problems and recurrent acute ear infections (acute otitis media), which may require treatment. This information is not intended to replace advice given to you by your health care  provider. Make sure you discuss any questions you have with your health care provider. Document Revised: 10/12/2020 Document Reviewed: 10/12/2020 Elsevier Patient Education  [place].

## 2021-08-28 LAB — COMPREHENSIVE METABOLIC PANEL
ALT: 13 U/L (ref 0–35)
AST: 23 U/L (ref 0–37)
Albumin: 4.7 g/dL (ref 3.5–5.2)
Alkaline Phosphatase: 47 U/L (ref 39–117)
BUN: 26 mg/dL — ABNORMAL HIGH (ref 6–23)
CO2: 29 mEq/L (ref 19–32)
Calcium: 9.8 mg/dL (ref 8.4–10.5)
Chloride: 99 mEq/L (ref 96–112)
Creatinine, Ser: 1.2 mg/dL (ref 0.40–1.20)
GFR: 41.99 mL/min — ABNORMAL LOW (ref >60.00)
Glucose, Bld: 92 mg/dL (ref 70–99)
Potassium: 5 mEq/L (ref 3.5–5.1)
Sodium: 136 mEq/L (ref 135–145)
Total Bilirubin: 0.7 mg/dL (ref 0.2–1.2)
Total Protein: 7.2 g/dL (ref 6.0–8.3)

## 2021-08-28 LAB — CBC WITH DIFFERENTIAL/PLATELET
Basophils Absolute: 0 10*3/uL (ref 0.0–0.1)
Basophils Relative: 0.9 % (ref 0.0–3.0)
Eosinophils Absolute: 0.1 10*3/uL (ref 0.0–0.7)
Eosinophils Relative: 1.3 % (ref 0.0–5.0)
HCT: 38.8 % (ref 36.0–46.0)
Hemoglobin: 12.9 g/dL (ref 12.0–15.0)
Lymphocytes Relative: 23.2 % (ref 12.0–46.0)
Lymphs Abs: 1.1 10*3/uL (ref 0.7–4.0)
MCHC: 33.1 g/dL (ref 30.0–36.0)
MCV: 83.1 fl (ref 78.0–100.0)
Monocytes Absolute: 0.6 10*3/uL (ref 0.1–1.0)
Monocytes Relative: 12.9 % — ABNORMAL HIGH (ref 3.0–12.0)
Neutro Abs: 3.1 10*3/uL (ref 1.4–7.7)
Neutrophils Relative %: 61.7 % (ref 43.0–77.0)
Platelets: 154 10*3/uL (ref 150.0–400.0)
RBC: 4.67 Mil/uL (ref 3.87–5.11)
RDW: 13.7 % (ref 11.5–15.5)
WBC: 5 10*3/uL (ref 4.0–10.5)

## 2021-08-28 LAB — SEDIMENTATION RATE: Sed Rate: 4 mm/hr (ref 0–30)

## 2021-09-02 DIAGNOSIS — H6501 Acute serous otitis media, right ear: Secondary | ICD-10-CM | POA: Insufficient documentation

## 2021-09-02 NOTE — Assessment & Plan Note (Addendum)
Antihistamine and flonase  ?Add astelin ?F/u prn  ?

## 2021-09-11 ENCOUNTER — Telehealth (HOSPITAL_BASED_OUTPATIENT_CLINIC_OR_DEPARTMENT_OTHER): Payer: Self-pay

## 2021-09-14 ENCOUNTER — Telehealth: Payer: Self-pay

## 2021-09-14 NOTE — Telephone Encounter (Signed)
Pt brought by the provider page of her pt asst application. It has been completed and placed in Dr Aquilla Hacker box for signature.  ?

## 2021-09-18 ENCOUNTER — Telehealth: Payer: Self-pay

## 2021-09-18 DIAGNOSIS — M7989 Other specified soft tissue disorders: Secondary | ICD-10-CM

## 2021-09-18 NOTE — Telephone Encounter (Signed)
Application signed by provider and faxed as requested. ?

## 2021-09-18 NOTE — Telephone Encounter (Signed)
**Note De-Identified Kanchan Gal Obfuscation** We received the providers page of a [employer] application for Eliquis from [employer] with a request to complete, have MD sign, and to fax back to them. ? ?I have completed the providers page with Dr Theodosia Blender (DOD) information as Dr Caryl Comes will not be back in the office until next week and have e-mailed it to Dr Olin Pia nurse so she can obtain Dr Landis Gandy signature, date it, and to fax to [facility] at the fax number written on the cover letter included. ?

## 2021-09-21 ENCOUNTER — Telehealth (HOSPITAL_BASED_OUTPATIENT_CLINIC_OR_DEPARTMENT_OTHER): Payer: Self-pay

## 2021-09-23 ENCOUNTER — Other Ambulatory Visit: Payer: Self-pay | Admitting: Family Medicine

## 2021-09-26 NOTE — Telephone Encounter (Signed)
**Note De-Identified Stanly Si Obfuscation** Letter received [name] fax from [facility] stating that they have approved the pt for Eliquis assistance until 06/30/2022. ?[id number] ? ?The letter states that they have notified the pt of this approval as well. ?

## 2021-10-12 ENCOUNTER — Other Ambulatory Visit (HOSPITAL_BASED_OUTPATIENT_CLINIC_OR_DEPARTMENT_OTHER): Payer: Self-pay

## 2021-10-12 ENCOUNTER — Encounter: Payer: Self-pay | Admitting: Internal Medicine

## 2021-10-12 ENCOUNTER — Ambulatory Visit (INDEPENDENT_AMBULATORY_CARE_PROVIDER_SITE_OTHER): Payer: Medicare Other | Admitting: Internal Medicine

## 2021-10-12 VITALS — BP 140/82 | HR 72 | Ht 63.0 in | Wt 153.4 lb

## 2021-10-12 DIAGNOSIS — I480 Paroxysmal atrial fibrillation: Secondary | ICD-10-CM | POA: Diagnosis not present

## 2021-10-12 DIAGNOSIS — Z950 Presence of cardiac pacemaker: Secondary | ICD-10-CM

## 2021-10-12 DIAGNOSIS — I442 Atrioventricular block, complete: Secondary | ICD-10-CM | POA: Diagnosis not present

## 2021-10-12 MED ORDER — HYDROCHLOROTHIAZIDE 12.5 MG PO CAPS
12.5000 mg | ORAL_CAPSULE | Freq: Every day | ORAL | 3 refills | Status: DC
  Filled 2021-10-12: qty 90, 90d supply, fill #0
  Filled 2022-01-06: qty 90, 90d supply, fill #1
  Filled 2022-04-07: qty 90, 90d supply, fill #2
  Filled 2022-07-07: qty 90, 90d supply, fill #3

## 2021-10-12 NOTE — Progress Notes (Signed)
So I think to receive 5 people in sync to see  ?Patient Care Team: ?Carollee Herter, Alferd Apa, DO as PCP - General (Family Medicine) ?Deboraha Sprang, MD as PCP - Cardiology (Cardiology) ?Alexis Frock, MD as Consulting Physician (Urology) ?Deboraha Sprang, MD as Consulting Physician (Cardiology) ?Ronald Lobo, MD as Consulting Physician (Gastroenterology) ? ? ?HPI ? ?[name] is a 81 y.o. female ?Seen in followup for a Medtronic pacemaker implanted for chronic high-grade heart block which reached ERI 3/13 at that time underwent generator replacement.  Also found to have atrial fibrillation which has been cardioverted ? ?Because of some dypsnea she underwent echo >> ?4/16 normal LV function ?10/16 Myoview normal LV function and no ischemia ? ?The patient denies chest pain, shortness of breath, nocturnal dyspnea, orthopnea  There have been no palpitations, lightheadedness or syncope.  Some edema.  Wearing compression stockings.  Blood pressure not well controlled on the discontinuation of her hydrochlorothiazide and the introduction of metoprolol. ? ?Date Cr K Hgb  ?1/21 1.01 3.8    ? 2/23 1.2 5.0 12.9  ?  ?Thromboembolic risk factors ( age  -2, HTN-1 , Gender-1) for a CHADSVASc Score of >=4 ? ? ?Past Medical History:  ?Diagnosis Date  ? Atrioventricular block, complete (HCC)   ? a. 08/2011 Upgrade of PPM to MDT Adapta L Dual Chamber PPM ser # [id number].  ? Cardiac pacemaker in situ   ? Contact dermatitis and eczema   ? unspec cause  ? Dairy product intolerance   ? Depression   ? Diastolic dysfunction   ? GERD (gastroesophageal reflux disease)   ? Hiatal hernia   ? History of diverticulitis of colon   ? 2003  perforated diverticulitis w/ surgical intervention  ? History of kidney stones   ? age 36  ? History of peripheral edema   ? lower extremities  ? History of thyroid nodule   ? multinodular goiter  s/p  total thyroidectomy 2014  ? HTN (hypertension)   ? Hydronephrosis, left   ? Hypothyroidism   ? Mild  sleep apnea   ? per study 04-14-2014  ? Nocturia   ? NSVT (nonsustained ventricular tachycardia) ([place])   ? Osteoporosis   ? PAF (paroxysmal atrial fibrillation) ([place])   ? PONV (postoperative nausea and vomiting)   ? Pulmonary nodules   ? stable per last ct  ? Renal cell carcinoma of left kidney (HCC)   ? Superficial thrombophlebitis   ? ? ?Past Surgical History:  ?Procedure Laterality Date  ? CARDIOVASCULAR STRESS TEST  04-20-2015  dr Caryl Comes  ? normal nuclear study/  no ischemia/  normal LV function and wall motion, ef 74%  ? COLONOSCOPY  01/19/2013  ? COLOSTOMY TAKEDOWN  07-22-2002  ? CYSTOSCOPY WITH RETROGRADE PYELOGRAM, URETEROSCOPY AND STENT PLACEMENT Left 07/28/2015  ? Procedure: CYSTOSCOPY WITH LEFT RETROGRADE PYELOGRAM, POSSIBLE LEFT URETEROSCOPY AND STENT PLACEMENT;  Surgeon: Franchot Gallo, MD;  Location: [facility];  Service: Urology;  Laterality: Left;  ? EXPLORATORY LAPARTOMY SIGMOID COLECTOMY/ COLOSTOMY  04-28-2002  ? perforated diverticulitis  ? KYPHOPLASTY    ? LAPAROSCOPY VENTRAL HERNIA REPAIR/  EXTENSIVE LYSIS ADHESIONS  05-04-2004  ? PACEMAKER GENERATOR CHANGE Left 09/16/2011  ? MDT ADDRL1 pacemaker  ? PACEMAKER PLACEMENT  05-11-2003    ? medtronic  ? ROBOT ASSISTED LAPAROSCOPIC NEPHRECTOMY Left 03/20/2016  ? Procedure: XI ROBOTIC ASSISTED LAPAROSCOPIC RETROPERITONEAL NEPHRECTOMY;  Surgeon: Alexis Frock, MD;  Location: [facility];  Service: Urology;  Laterality: Left;  ?  THYROIDECTOMY N/A 08/07/2012  ? Procedure: THYROIDECTOMY;  Surgeon: Earnstine Regal, MD;  Location: [facility];  Service: General;  Laterality: N/A;  ? TRANSTHORACIC ECHOCARDIOGRAM  10-12-2014  ? moderate focal basal LVH,  ef 55-60%,  grade 2 diastolic dysfunction, mild AV calcification without stenosis,  mild MR,  trivial PR  ? ? ?Current Outpatient Medications  ?Medication Sig Dispense Refill  ? acetaminophen (TYLENOL) 500 MG tablet Take 1,000 mg by mouth daily as needed for moderate pain.    ? amLODipine (NORVASC) 10 MG  tablet Take 1 tablet (10 mg total) by mouth daily. 90 tablet 2  ? apixaban (ELIQUIS) 5 MG TABS tablet Take 1 tablet (5 mg total) by mouth 2 (two) times daily. 180 tablet 1  ? azelastine (ASTELIN) 0.1 % nasal spray Place 1 spray into both nostrils 2 (two) times daily. Use in each nostril as directed 30 mL 12  ? denosumab (PROLIA) 60 MG/ML SOSY injection Inject 60 mg into the skin every 6 (six) months.    ? diclofenac Sodium (VOLTAREN) 1 % GEL APPLY 4 GRAMS ON TO THE SKIN 4 (FOUR) TIMES DAILY. 100 g 2  ? diphenhydramine-acetaminophen (TYLENOL PM) 25-500 MG TABS tablet Take 2 tablets by mouth at bedtime as needed (sleep/pain).    ? fluticasone (FLONASE) 50 MCG/ACT nasal spray PLACE 2 SPRAYS INTO BOTH NOSTRILS DAILY AS NEEDED FOR ALLERGIES OR RHINITIS. 16 g 2  ? gabapentin (NEURONTIN) 100 MG capsule Take 1 capsule (100 mg total) by mouth 3 (three) times daily. 90 capsule 5  ? levocetirizine (XYZAL) 5 MG tablet Take 1 tablet (5 mg total) by mouth every evening. 90 tablet 3  ? levothyroxine (SYNTHROID) 100 MCG tablet Take 1 tablet (100 mcg total) by mouth daily before breakfast. 90 tablet 3  ? lisinopril (ZESTRIL) 40 MG tablet Take 1 tablet (40 mg total) by mouth daily. 90 tablet 1  ? metoprolol tartrate (LOPRESSOR) 25 MG tablet Take 1 tablet by mouth twice daily 180 tablet 0  ? pantoprazole (PROTONIX) 40 MG tablet Take 1 tablet (40 mg total) by mouth 2 (two) times daily before a meal. 60 tablet 2  ? loratadine (CLARITIN) 10 MG tablet TAKE 1 TABLET (10 MG TOTAL) BY MOUTH DAILY AS NEEDED FOR ALLERGIES. 100 tablet 2  ? ?No current facility-administered medications for this visit.  ? ?Facility-Administered Medications Ordered in Other Visits  ?Medication Dose Route Frequency Provider Last Rate Last Admin  ? aminophylline injection 150 mg  150 mg Intravenous BID PRN Dorothy Spark, MD   150 mg at 04/20/15 1200  ? ? ?Allergies  ?Allergen Reactions  ? Tape Rash  ? Codeine Nausea And Vomiting  ? ? ?Review of Systems negative  except from HPI and PMH ? ?Physical Exam ?BP 140/82   Pulse 72   Ht '5\' 3"'$  (1.6 m)   Wt 153 lb 6.4 oz (69.6 kg)   SpO2 99%   BMI 27.17 kg/m?  ?Well developed and well nourished in no acute distress ?HENT normal ?Neck supple with JVP-flat ?Clear ?Device pocket well healed; without hematoma or erythema.  There is no tethering  ?Regular rate and rhythm, no murmur ?Abd-soft with active BS ?No Clubbing cyanosis  edema ?Skin-warm and dry ?A & Oriented  Grossly normal sensory and motor function ? ?ECG sinus with P-synchronous/ AV  pacing   ? ?Assessment and  Plan ? ?Complete heart block  ? 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 ?Hypertension ? ?HFpEF ? ?Ventricular tachycardia-nonsustained ? ?Pacemaker-Medtronic (DOI 2013)  ? ?  Atrial fibrillation ? ? ? BLOOD pressure poorly controlled-- will stop metop and resume, in context of fluid overload, hctz 12.5 and continue lisinopril and amlodipine   ? ?For HFpEF will begin diuretic as above ? ?Marland KitchenOn Anticoagulant for thromboembolic risk reduction.  No bleeding issues.  Continue Apixaban  at 5 mg bid ? ? ? ?   ? ?  ? ? ? ? ? ? ?

## 2021-10-12 NOTE — Patient Instructions (Addendum)
Medication Instructions:  ?Your physician has recommended you make the following change in your medication:  ? ?** Begin Hydrochlorothiazide 12.'5mg'$  - 1 tablet by mouth daily and continue Lisinopril '40mg'$  - 1 tablet by mouth daily. ? ?There is not a combination of Lisinopril-HCTZ 40/12.'5mg'$  ? ?*If you need a refill on your cardiac medications before your next appointment, please call your pharmacy* ? ? ?Lab Work: ?None ordered. ? ?If you have labs (blood work) drawn today and your tests are completely normal, you will receive your results only by: ?MyChart Message (if you have MyChart) OR ?A paper copy in the mail ?If you have any lab test that is abnormal or we need to change your treatment, we will call you to review the results. ? ? ?Testing/Procedures: ?None ordered. ? ? ? ?Follow-Up: ?At [facility], you and your health needs are our priority.  As part of our continuing mission to provide you with exceptional heart care, we have created designated Provider Care Teams.  These Care Teams include your primary Cardiologist (physician) and Advanced Practice Providers (APPs -  Physician Assistants and Nurse Practitioners) who all work together to provide you with the care you need, when you need it. ? ?We recommend signing up for the patient portal called "MyChart".  Sign up information is provided on this After Visit Summary.  MyChart is used to connect with patients for Virtual Visits (Telemedicine).  Patients are able to view lab/test results, encounter notes, upcoming appointments, etc.  Non-urgent messages can be sent to your provider as well.   ?To learn more about what you can do with MyChart, go to [email or web address].   ? ?Your next appointment:   ?12 months with Dr Caryl Comes ? ?Important Information About Sugar ? ? ? ? ?  ?

## 2021-10-18 ENCOUNTER — Other Ambulatory Visit (HOSPITAL_BASED_OUTPATIENT_CLINIC_OR_DEPARTMENT_OTHER): Payer: Self-pay

## 2021-10-19 ENCOUNTER — Other Ambulatory Visit (HOSPITAL_BASED_OUTPATIENT_CLINIC_OR_DEPARTMENT_OTHER): Payer: Self-pay

## 2021-10-22 ENCOUNTER — Other Ambulatory Visit (HOSPITAL_BASED_OUTPATIENT_CLINIC_OR_DEPARTMENT_OTHER): Payer: Self-pay

## 2021-10-26 ENCOUNTER — Other Ambulatory Visit (HOSPITAL_BASED_OUTPATIENT_CLINIC_OR_DEPARTMENT_OTHER): Payer: Self-pay

## 2021-11-07 ENCOUNTER — Ambulatory Visit (INDEPENDENT_AMBULATORY_CARE_PROVIDER_SITE_OTHER): Payer: Medicare Other

## 2021-11-07 DIAGNOSIS — I442 Atrioventricular block, complete: Secondary | ICD-10-CM | POA: Diagnosis not present

## 2021-11-09 ENCOUNTER — Other Ambulatory Visit (HOSPITAL_BASED_OUTPATIENT_CLINIC_OR_DEPARTMENT_OTHER): Payer: Self-pay

## 2021-11-09 ENCOUNTER — Other Ambulatory Visit: Payer: Self-pay | Admitting: Family Medicine

## 2021-11-09 DIAGNOSIS — Z889 Allergy status to unspecified drugs, medicaments and biological substances status: Secondary | ICD-10-CM

## 2021-11-09 MED ORDER — LORATADINE 10 MG PO TABS
ORAL_TABLET | ORAL | 1 refills | Status: DC
  Filled 2021-11-09: qty 100, 100d supply, fill #0
  Filled 2022-02-10: qty 100, 100d supply, fill #1

## 2021-11-16 LAB — CUP PACEART REMOTE DEVICE CHECK
Battery Impedance: 2584 Ohm
Battery Remaining Longevity: 23 mo
Battery Voltage: 2.71 V
Brady Statistic AP VP Percent: 3 %
Brady Statistic AP VS Percent: 0 %
Brady Statistic AS VP Percent: 97 %
Brady Statistic AS VS Percent: 0 %
Date Time Interrogation Session: 20230512112036
Implantable Lead Implant Date: 20041110
Implantable Lead Implant Date: 20041110
Implantable Lead Location: 753859
Implantable Lead Location: 753860
Implantable Pulse Generator Implant Date: 20130318
Lead Channel Impedance Value: 380 Ohm
Lead Channel Impedance Value: 509 Ohm
Lead Channel Pacing Threshold Amplitude: 0.5 V
Lead Channel Pacing Threshold Amplitude: 0.75 V
Lead Channel Pacing Threshold Pulse Width: 0.4 ms
Lead Channel Pacing Threshold Pulse Width: 0.4 ms
Lead Channel Setting Pacing Amplitude: 2 V
Lead Channel Setting Pacing Amplitude: 2.5 V
Lead Channel Setting Pacing Pulse Width: 0.4 ms
Lead Channel Setting Sensing Sensitivity: 4 mV

## 2021-11-19 NOTE — Progress Notes (Signed)
Remote pacemaker transmission.   

## 2021-11-20 ENCOUNTER — Ambulatory Visit (INDEPENDENT_AMBULATORY_CARE_PROVIDER_SITE_OTHER): Payer: Medicare Other | Admitting: Internal Medicine

## 2021-11-20 ENCOUNTER — Encounter: Payer: Self-pay | Admitting: Internal Medicine

## 2021-11-20 VITALS — BP 150/100 | HR 84 | Ht 63.0 in | Wt 152.6 lb

## 2021-11-20 DIAGNOSIS — E559 Vitamin D deficiency, unspecified: Secondary | ICD-10-CM

## 2021-11-20 DIAGNOSIS — E890 Postprocedural hypothyroidism: Secondary | ICD-10-CM | POA: Diagnosis not present

## 2021-11-20 DIAGNOSIS — M810 Age-related osteoporosis without current pathological fracture: Secondary | ICD-10-CM

## 2021-11-20 NOTE — Patient Instructions (Addendum)
Please stop at the lab.  Stop the calcium-vitamin D supplement.  Continue Levothyroxine 100 mcg daily.  Take the thyroid hormone every day, with water, at least 30 minutes before breakfast, separated by at least 4 hours from: - acid reflux medications - calcium - iron - multivitamins  Please come back for a follow-up appointment in 1 year.

## 2021-11-20 NOTE — Progress Notes (Unsigned)
Patient ID: [name], female   DOB: August 07, 1940, 81 y.o.   MRN: [id number]   This visit occurred during the SARS-CoV-2 public health emergency.  Safety protocols were in place, including screening questions prior to the visit, additional usage of staff PPE, and extensive cleaning of exam room while observing appropriate contact time as indicated for disinfecting solutions.   HPI  [name] is a 81 y.o.-year-old female, referred by her PCP, Dr. Etter Sjogren, presenting for follow-up for osteoporosis.  Last visit 1 year ago.  Interim history: No falls or fractures since last visit. No dizziness and occasiona orthostasis. She has some instability on her feet (ankle pb.).  Reviewed and addended history: Pt was dx with OP in in 1999-2000, in [place].  Reviewed her DXA scan reports: Date L1-L4 T score FN T score 33% distal Radius Ultra distal radius  12/02/2019 ([facility]) N/a LFN: -2.8 RFN: -2.7  (+9.4%*) -3.0 (+2.2%) -4.0  05/29/2017 ([facility]) N/a (multiple compression fractures) LFN: -3.1 (+0.5%) RFN: -2.9 -3.2 (-1.0%) -4.6  05/29/2015 ([facility]) L1, L3: -2.7 LFN: -3.1 -3.1 n/a  06/04/2012 L1-L4: -3.4 LFN: -2.2 RFN: -2.2 n/a n/a   Fractures: 7 vertebral fractures with 6 kyphoplasties: Reviewed CT Lumbar spine report from 2016: Remote compression fractures with cement augmentation T11, T12, L2, L4 and L5. Tiny amount of cement seen adjacent to the right L4 and L5 nerve roots. Subacute superior endplate compression deformity L1 and L3.  Osteoporosis treatments: - Fosamax  - 2 years >> ineffective - Forteo - 2 years  - 2003 - Prolia - started 2012 (however, only 1 documented inj/year in 2012, 2014, 2015 and no doses documented in 2017):  10/09/2017 05/14/2018 11/19/2018 06/01/2019 12/15/2019 06/15/2020 12/20/2020 06/29/2021  She has a history of vitamin D insufficiency.  Reviewed available vit D levels: Lab Results  Component Value Date    VD25OH 69.4 12/20/2020   VD25OH 52.5 11/16/2019   VD25OH 37.32 01/18/2019   VD25OH 29.82 (L) 10/06/2017   VD25OH 32 07/04/2009   VD25OH 24 (L) 02/14/2009   On vitamin D 7000 units daily: Vit D3...............5,000 i.u. Multi w/D3.......1,000 i.u. Cal + D3............1,000 i.u. -She is having problems swallowing this due to the large tablet.  She does does weightbearing exercises: stretch bands; and also balance exercises, walking on treadmill.  She does not take high vitamin A doses.  No steroid injections.  She had early menopause, at 40 to 81 years old.  No HRT.   Pt does have a FH of osteoporosis: Mother- fx'ed ankle, foot.  No history of kidney stones, hyper or hypocalcemia or hyperparathyroidism: Lab Results  Component Value Date   PTH 25 10/06/2017   PTH Comment 10/06/2017   CALCIUM 9.8 08/27/2021   CALCIUM 9.1 06/06/2021   CALCIUM 9.4 10/16/2020   CALCIUM 9.1 09/29/2020   CALCIUM 9.6 09/21/2020   CALCIUM 8.6 (L) 09/17/2020   CALCIUM 8.4 (L) 09/16/2020   CALCIUM 8.8 (L) 09/15/2020   CALCIUM 9.3 09/14/2020   CALCIUM 9.9 09/13/2020   No history of thyrotoxicosis. She has a h/o thyroid nodules >> now s/p total thyroidectomy, on LT4 100 mcg.  Latest TSH was normal: Lab Results  Component Value Date   TSH 1.33 12/20/2020   TSH 1.34 12/11/2020   TSH 3.04 07/12/2019   TSH 2.92 05/14/2018   TSH 5.40 (H) 03/05/2018   Pt is on levothyroxine 100 mcg daily, taken: - in am - fasting - at least 1h from  b'fast - + calcium 1h later >> moved to more than 4 hours later - no iron - + multivitamins >> later in the day - + PPIs in am 30 min before b'fast >> stopped - not on Biotin  No history of CKD. She had only 1 kidney. Last BUN/Cr: Lab Results  Component Value Date   BUN 26 (H) 08/27/2021   CREATININE 1.20 08/27/2021   She also has HTN.  ROS: + leg swelling (wears compression hoses), + see HPI  I reviewed pt's medications, allergies, PMH, social hx, family hx,  and changes were documented in the history of present illness. Otherwise, unchanged from my initial visit note.  Past Medical History:  Diagnosis Date   Atrioventricular block, complete ([place])    a. 08/2011 Upgrade of PPM to MDT Adapta L Dual Chamber PPM ser # [id number].   Cardiac pacemaker in situ    Contact dermatitis and eczema    unspec cause   Dairy product intolerance    Depression    Diastolic dysfunction    GERD (gastroesophageal reflux disease)    Hiatal hernia    History of diverticulitis of colon    2003  perforated diverticulitis w/ surgical intervention   History of kidney stones    age 77   History of peripheral edema    lower extremities   History of thyroid nodule    multinodular goiter  s/p  total thyroidectomy 2014   HTN (hypertension)    Hydronephrosis, left    Hypothyroidism    Mild sleep apnea    per study 04-14-2014   Nocturia    NSVT (nonsustained ventricular tachycardia) ([facility])    Osteoporosis    PAF (paroxysmal atrial fibrillation) (H[facility])    PONV (postoperative nausea and vomiting)    Pulmonary nodules    stable per last ct   Renal cell carcinoma of left kidney ([place])    Superficial thrombophlebitis    Past Surgical History:  Procedure Laterality Date   CARDIOVASCULAR STRESS TEST  04-20-2015  dr Caryl Comes   normal nuclear study/  no ischemia/  normal LV function and wall motion, ef 74%   COLONOSCOPY  01/19/2013   COLOSTOMY TAKEDOWN  07-22-2002   CYSTOSCOPY WITH RETROGRADE PYELOGRAM, URETEROSCOPY AND STENT PLACEMENT Left 07/28/2015   Procedure: CYSTOSCOPY WITH LEFT RETROGRADE PYELOGRAM, POSSIBLE LEFT URETEROSCOPY AND STENT PLACEMENT;  Surgeon: Franchot Gallo, MD;  Location: [facility];  Service: Urology;  Laterality: Left;   EXPLORATORY LAPARTOMY SIGMOID COLECTOMY/ COLOSTOMY  04-28-2002   perforated diverticulitis   KYPHOPLASTY     LAPAROSCOPY VENTRAL HERNIA REPAIR/  EXTENSIVE LYSIS ADHESIONS  05-04-2004   PACEMAKER GENERATOR CHANGE  Left 09/16/2011   MDT ADDRL1 pacemaker   PACEMAKER PLACEMENT  05-11-2003     medtronic   ROBOT ASSISTED LAPAROSCOPIC NEPHRECTOMY Left 03/20/2016   Procedure: XI ROBOTIC ASSISTED LAPAROSCOPIC RETROPERITONEAL NEPHRECTOMY;  Surgeon: Alexis Frock, MD;  Location: [facility];  Service: Urology;  Laterality: Left;   THYROIDECTOMY N/A 08/07/2012   Procedure: THYROIDECTOMY;  Surgeon: Earnstine Regal, MD;  Location: [facility];  Service: General;  Laterality: N/A;   TRANSTHORACIC ECHOCARDIOGRAM  10-12-2014   moderate focal basal LVH,  ef 55-60%,  grade 2 diastolic dysfunction, mild AV calcification without stenosis,  mild MR,  trivial PR   Social History   Socioeconomic History   Marital status: Single    Spouse name: Not on file   Number of children: 2   Years of education: 16   Highest education level:  Not on file  Occupational History   Occupation: Retired Production assistant, 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  She was married for 22 years, divorced, and has remained single. She has 1 grown son, 1 grown daughter.  She has 3 grandchildren, 2 of her children   live in [place].  She is retired.  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 She had a long term companion, 32 years, who passed away September 14, 2010.   Social Determinants of Health   Financial Resource Strain: Low Risk    Difficulty of Paying Living Expenses: Not hard at all  Food Insecurity: No Food Insecurity   Worried About Charity fundraiser in the  Last Year: Never true   [place] in the Last Year: Never true  Transportation Needs: No Transportation Needs   Lack of Transportation (Medical): No   Lack of Transportation (Non-Medical): No  Physical Activity: Insufficiently Active   Days of Exercise per Week: 4 days   Minutes of Exercise per Session: 30 min  Stress: No Stress Concern Present   Feeling of Stress : Not at all  Social Connections: Socially Isolated   Frequency of Communication with Friends and Family: More than three times a week   Frequency of Social Gatherings with Friends and Family: More than three times a week   Attends Religious Services: Never   Marine scientist or Organizations: No   Attends Music therapist: Never   Marital Status: Divorced  Human resources officer Violence: Not At Risk   Fear of Current or Ex-Partner: No   Emotionally Abused: No   Physically Abused: No   Sexually Abused: No   Current Outpatient Medications on File Prior to Visit  Medication Sig Dispense Refill   acetaminophen (TYLENOL) 500 MG tablet Take 1,000 mg by mouth daily as needed for moderate pain.     amLODipine (NORVASC) 10 MG tablet Take 1 tablet (10 mg total) by mouth daily. 90 tablet 2   apixaban (ELIQUIS) 5 MG TABS tablet Take 1 tablet (5 mg total) by mouth 2 (two) times daily. 180 tablet 1   azelastine (ASTELIN) 0.1 % nasal spray Place 1 spray into both nostrils 2 (two) times daily. Use in each nostril as directed 30 mL 12   denosumab (PROLIA) 60 MG/ML SOSY injection Inject 60 mg into the skin every 6 (six) months.     diclofenac Sodium (VOLTAREN) 1 % GEL APPLY 4 GRAMS ON TO THE SKIN 4 (FOUR) TIMES DAILY. 100 g 2   diphenhydramine-acetaminophen (TYLENOL PM) 25-500 MG TABS tablet Take 2 tablets by mouth at bedtime as needed (sleep/pain).     fluticasone (FLONASE) 50 MCG/ACT nasal spray PLACE 2 SPRAYS INTO BOTH NOSTRILS DAILY AS NEEDED FOR ALLERGIES OR RHINITIS. 16 g 2   gabapentin (NEURONTIN) 100 MG capsule  Take 1  capsule (100 mg total) by mouth 3 (three) times daily. 90 capsule 5   hydrochlorothiazide (MICROZIDE) 12.5 MG capsule Take 1 capsule (12.5 mg total) by mouth daily. 90 capsule 3   levocetirizine (XYZAL) 5 MG tablet Take 1 tablet (5 mg total) by mouth every evening. 90 tablet 3   levothyroxine (SYNTHROID) 100 MCG tablet Take 1 tablet (100 mcg total) by mouth daily before breakfast. 90 tablet 3   lisinopril (ZESTRIL) 40 MG tablet Take 1 tablet (40 mg total) by mouth daily. 90 tablet 1   loratadine (ALLERGY RELIEF, LORATADINE,) 10 MG tablet TAKE 1 TABLET (10 MG TOTAL) BY MOUTH DAILY AS NEEDED FOR ALLERGIES. 100 tablet 1   metoprolol tartrate (LOPRESSOR) 25 MG tablet Take 1 tablet by mouth twice daily 180 tablet 0   pantoprazole (PROTONIX) 40 MG tablet Take 1 tablet (40 mg total) by mouth 2 (two) times daily before a meal. 60 tablet 2   Current Facility-Administered Medications on File Prior to Visit  Medication Dose Route Frequency Provider Last Rate Last Admin   aminophylline injection 150 mg  150 mg Intravenous BID PRN Dorothy Spark, MD   150 mg at 04/20/15 1200   Allergies  Allergen Reactions   Tape Rash   Codeine Nausea And Vomiting   Family History  Problem Relation Age of Onset   Heart disease Mother        Stent in her 64s   Stroke Mother    Hypertension Mother    Heart failure Father    Diverticulosis Sister    Hypertension Brother    Heart disease Brother    Hypertension Brother    Heart attack Neg Hx    PE: BP (!) 150/100 (BP Location: Right Arm, Patient Position: Sitting, Cuff Size: Normal)   Pulse 84   Ht '5\' 3"'  (1.6 m)   Wt 152 lb 9.6 oz (69.2 kg)   SpO2 99%   BMI 27.03 kg/m  Wt Readings from Last 3 Encounters:  11/20/21 152 lb 9.6 oz (69.2 kg)  10/12/21 153 lb 6.4 oz (69.6 kg)  08/27/21 152 lb 3.2 oz (69 kg)   Constitutional: normal weight, in NAD Eyes: PERRLA, EOMI, no exophthalmos ENT: moist mucous membranes, no thyromegaly, no cervical  lymphadenopathy Cardiovascular: RRR, No MRG Respiratory: CTA B Musculoskeletal: no deformities, strength intact in all 4 Skin: moist, warm, no rashes Neurological: no tremor with outstretched hands, DTR normal in all 4  Assessment: 1. Osteoporosis  2.  Vitamin D insufficiency  3.  Postsurgical hypothyroidism  Plan: 1. Osteoporosis -Likely postmenopausal (early menopause, no HRT), + age-related, + with family history of osteoporosis -Reviewed her latest bone density scan from 12/02/2019: She had significant increase in bone density at the femoral necks.  T score was low in the ultra distal and 33% distal radius, but improved.  She remains at high risk for fractures.  She is due for another bone density scan, which were ordered today -In the past investigation was negative for excessive steroid production, hyperparathyroidism, severe vitamin D deficiency, RA, Cushing syndrome, systemic mastocytosis.  She did not have a history of cancer to suggest metastasis to the bone, no renal failure to indicate multiple myeloma, no history of low alkaline phosphatase, no previous fractures before her vertebral fx or blue sclerae to indicate osteogenesis imperfecta.  -She had several vertebral fractures, possibly as a consequence of early menopause but also missed Prolia injections -We did discuss in the past about not missing Prolia injections and not delaying  them by more than 7 months due to the increased risk of fracture and loss of BMD -She was on Prolia for 10 years, however, she missed doses in the past.  For now, especially if the bone density improves or stays stable that she does not fractures, I do not see a good reason to stop it.  We discussed that is now go beyond 10 years with continuing fracture benefits.  She tolerates the medication well, without jaw/hip/thigh pain. -However, when we are planning to stop Prolia, we need to give her 1-2 doses of Reclast -She did have a history of using Forteo  for 2 years.  We could use romosozumab afterwards if absolutely needed. -She continues on vitamin D and calcium supplements.  Latest calcium level was normal earlier in the year -I recommended weightbearing and balance exercising at last visit - she is using bands and weights -I will see her back in 1 year  2.  Vitamin D insufficiency -Latest vitamin D level was normal at last visit Lab Results  Component Value Date   VD25OH 69.4 12/20/2020  -At that time, she was taking a total of 7000 units vitamin D daily.  We continued this dose. -However, at this visit, since she is complaining of calcium/vitamin D tablet being too large, we will go ahead and stop this.  She is getting calcium from the multivitamin and we discussed about getting the rest from the diet (for a total of 1200 mg daily at least) -We will recheck the vitamin D level today  3.  Postsurgical hypothyroidism - latest thyroid labs reviewed with pt. >> normal: Lab Results  Component Value Date   TSH 1.33 12/20/2020  - she continues on LT4 100 mcg daily - pt feels good on this dose. - we discussed about taking the thyroid hormone every day, with water, >30 minutes before breakfast, separated by >4 hours from acid reflux medications, calcium, iron, multivitamins. Pt. is taking it correctly. - will check thyroid tests today: TSH and fT4 - If labs are abnormal, she will need to return for repeat TFTs in 1.5 months  Philemon Kingdom, MD PhD [facility] Endocrinology

## 2021-11-22 ENCOUNTER — Other Ambulatory Visit (INDEPENDENT_AMBULATORY_CARE_PROVIDER_SITE_OTHER): Payer: Medicare Other

## 2021-11-22 ENCOUNTER — Other Ambulatory Visit: Payer: Self-pay

## 2021-11-22 DIAGNOSIS — E890 Postprocedural hypothyroidism: Secondary | ICD-10-CM | POA: Diagnosis not present

## 2021-11-22 DIAGNOSIS — E559 Vitamin D deficiency, unspecified: Secondary | ICD-10-CM

## 2021-11-22 LAB — TSH: TSH: 1.03 u[IU]/mL (ref 0.35–5.50)

## 2021-11-22 LAB — VITAMIN D 25 HYDROXY (VIT D DEFICIENCY, FRACTURES): VITD: 83.86 ng/mL (ref 30.00–100.00)

## 2021-11-22 LAB — T4, FREE: Free T4: 1.14 ng/dL (ref 0.60–1.60)

## 2021-12-01 NOTE — Telephone Encounter (Signed)
Prolia VOB initiated via [email or web address]  Last OV:  Next OV:  Last Prolia inj 06/29/21 Next Prolia inj due 12/29/21

## 2021-12-06 ENCOUNTER — Ambulatory Visit (HOSPITAL_BASED_OUTPATIENT_CLINIC_OR_DEPARTMENT_OTHER)
Admission: RE | Admit: 2021-12-06 | Discharge: 2021-12-06 | Disposition: A | Discharge status: Home / Self Care | Payer: Medicare Other | Source: Ambulatory Visit | Attending: Internal Medicine | Admitting: Internal Medicine

## 2021-12-06 DIAGNOSIS — M810 Age-related osteoporosis without current pathological fracture: Secondary | ICD-10-CM | POA: Diagnosis present

## 2021-12-10 ENCOUNTER — Other Ambulatory Visit (HOSPITAL_BASED_OUTPATIENT_CLINIC_OR_DEPARTMENT_OTHER): Payer: Self-pay

## 2021-12-17 ENCOUNTER — Other Ambulatory Visit (HOSPITAL_BASED_OUTPATIENT_CLINIC_OR_DEPARTMENT_OTHER): Payer: Self-pay

## 2021-12-27 NOTE — Telephone Encounter (Signed)
Pt ready for scheduling on or after 12/29/21  Out-of-pocket cost due at time of visit: $0  Primary: Medicare Prolia co-insurance: 20% (approximately $276) Admin fee co-insurance: 20% (approximately $25)  Deductible:$226 of $226 met  Secondary: [place] [employer] Prolia co-insurance: Covers Medicare Part B co-insurance Admin fee co-insurance: Covers Medicare Part B co-insurance  Deductible: $300 of $300 met   Prior Auth: not required PA# Valid:     ** This summary of benefits is an estimation of the patient's out-of-pocket cost. Exact cost may very based on individual plan coverage.

## 2022-01-06 ENCOUNTER — Other Ambulatory Visit: Payer: Self-pay | Admitting: Family Medicine

## 2022-01-06 DIAGNOSIS — I10 Essential (primary) hypertension: Secondary | ICD-10-CM

## 2022-01-07 ENCOUNTER — Other Ambulatory Visit (HOSPITAL_COMMUNITY): Payer: Self-pay

## 2022-01-07 ENCOUNTER — Other Ambulatory Visit (HOSPITAL_BASED_OUTPATIENT_CLINIC_OR_DEPARTMENT_OTHER): Payer: Self-pay

## 2022-01-07 MED ORDER — LISINOPRIL 40 MG PO TABS
40.0000 mg | ORAL_TABLET | Freq: Every day | ORAL | 1 refills | Status: DC
  Filled 2022-01-07: qty 90, 90d supply, fill #0
  Filled 2022-04-07: qty 90, 90d supply, fill #1

## 2022-01-08 ENCOUNTER — Other Ambulatory Visit (HOSPITAL_COMMUNITY): Payer: Self-pay

## 2022-01-09 ENCOUNTER — Other Ambulatory Visit (HOSPITAL_COMMUNITY): Payer: Self-pay

## 2022-01-09 ENCOUNTER — Ambulatory Visit (INDEPENDENT_AMBULATORY_CARE_PROVIDER_SITE_OTHER): Payer: Medicare Other

## 2022-01-09 DIAGNOSIS — M810 Age-related osteoporosis without current pathological fracture: Secondary | ICD-10-CM | POA: Diagnosis not present

## 2022-01-09 MED ORDER — DENOSUMAB 60 MG/ML ~~LOC~~ SOSY
60.0000 mg | PREFILLED_SYRINGE | Freq: Once | SUBCUTANEOUS | Status: AC
  Administered 2022-01-09: 60 mg via SUBCUTANEOUS

## 2022-01-09 NOTE — Progress Notes (Signed)
Patient seen today for Prolia injection.  60mg/ml given in left arm subQ.  Patient tolerated well.  Follow up 6 months or as advised by provider.Patient verified name, DOB and provided verbal consent prior to administration. 

## 2022-01-14 ENCOUNTER — Other Ambulatory Visit (HOSPITAL_BASED_OUTPATIENT_CLINIC_OR_DEPARTMENT_OTHER): Payer: Self-pay

## 2022-01-15 ENCOUNTER — Other Ambulatory Visit (HOSPITAL_BASED_OUTPATIENT_CLINIC_OR_DEPARTMENT_OTHER): Payer: Self-pay

## 2022-01-30 ENCOUNTER — Telehealth: Payer: Self-pay | Admitting: Internal Medicine

## 2022-01-30 ENCOUNTER — Other Ambulatory Visit (HOSPITAL_BASED_OUTPATIENT_CLINIC_OR_DEPARTMENT_OTHER): Payer: Self-pay

## 2022-01-30 NOTE — Telephone Encounter (Signed)
error 

## 2022-02-02 NOTE — Telephone Encounter (Signed)
Last Prolia inj 01/09/22 Next Prolia inj due 07/13/22

## 2022-02-04 ENCOUNTER — Other Ambulatory Visit (HOSPITAL_BASED_OUTPATIENT_CLINIC_OR_DEPARTMENT_OTHER): Payer: Self-pay

## 2022-02-06 ENCOUNTER — Ambulatory Visit (INDEPENDENT_AMBULATORY_CARE_PROVIDER_SITE_OTHER): Payer: Medicare Other

## 2022-02-06 DIAGNOSIS — I442 Atrioventricular block, complete: Secondary | ICD-10-CM | POA: Diagnosis not present

## 2022-02-07 ENCOUNTER — Telehealth (HOSPITAL_BASED_OUTPATIENT_CLINIC_OR_DEPARTMENT_OTHER): Payer: Self-pay

## 2022-02-07 LAB — CUP PACEART REMOTE DEVICE CHECK
Battery Impedance: 2650 Ohm
Battery Remaining Longevity: 23 mo
Battery Voltage: 2.72 V
Brady Statistic AP VP Percent: 6 %
Brady Statistic AP VS Percent: 0 %
Brady Statistic AS VP Percent: 94 %
Brady Statistic AS VS Percent: 0 %
Date Time Interrogation Session: 20230810124204
Implantable Lead Implant Date: 20041110
Implantable Lead Implant Date: 20041110
Implantable Lead Location: 753859
Implantable Lead Location: 753860
Implantable Pulse Generator Implant Date: 20130318
Lead Channel Impedance Value: 380 Ohm
Lead Channel Impedance Value: 501 Ohm
Lead Channel Pacing Threshold Amplitude: 0.625 V
Lead Channel Pacing Threshold Amplitude: 0.75 V
Lead Channel Pacing Threshold Pulse Width: 0.4 ms
Lead Channel Pacing Threshold Pulse Width: 0.4 ms
Lead Channel Setting Pacing Amplitude: 2 V
Lead Channel Setting Pacing Amplitude: 2.5 V
Lead Channel Setting Pacing Pulse Width: 0.4 ms
Lead Channel Setting Sensing Sensitivity: 4 mV

## 2022-02-11 ENCOUNTER — Other Ambulatory Visit (HOSPITAL_BASED_OUTPATIENT_CLINIC_OR_DEPARTMENT_OTHER): Payer: Self-pay

## 2022-02-22 ENCOUNTER — Other Ambulatory Visit (HOSPITAL_BASED_OUTPATIENT_CLINIC_OR_DEPARTMENT_OTHER): Payer: Self-pay

## 2022-02-22 ENCOUNTER — Other Ambulatory Visit: Payer: Self-pay | Admitting: Family Medicine

## 2022-02-22 DIAGNOSIS — Z889 Allergy status to unspecified drugs, medicaments and biological substances status: Secondary | ICD-10-CM

## 2022-02-22 MED ORDER — FLUTICASONE PROPIONATE 50 MCG/ACT NA SUSP
2.0000 | Freq: Every day | NASAL | 5 refills | Status: DC | PRN
  Filled 2022-02-22: qty 16, 30d supply, fill #0
  Filled 2022-09-10: qty 16, 30d supply, fill #1

## 2022-02-25 ENCOUNTER — Other Ambulatory Visit (HOSPITAL_BASED_OUTPATIENT_CLINIC_OR_DEPARTMENT_OTHER): Payer: Self-pay

## 2022-03-07 ENCOUNTER — Encounter: Payer: Self-pay | Admitting: *Deleted

## 2022-03-07 ENCOUNTER — Telehealth: Payer: Self-pay | Admitting: *Deleted

## 2022-03-07 NOTE — Patient Outreach (Signed)
  Care Coordination   03/07/2022 Name: [name] MRN: [id number] DOB: January 11, 1941   Care Coordination Outreach Attempts:  An unsuccessful telephone outreach was attempted today to offer the patient information about available care coordination services as a benefit of their health plan.   Follow Up Plan:  Additional outreach attempts will be made to offer the patient care coordination information and services.   Encounter Outcome:  No Answer left voice message requesting call back  Care Coordination Interventions Activated:  No   Care Coordination Interventions:  No, not indicated Unsuccessful outreach attempt # 1   Oneta Rack, RN, BSN, CCRN Alumnus RN CM Care Coordination/ Transition of [place] [facility] [phone]: direct office

## 2022-03-08 ENCOUNTER — Telehealth: Payer: Self-pay | Admitting: *Deleted

## 2022-03-08 NOTE — Patient Outreach (Signed)
  Care Coordination   Initial Visit Note   03/08/2022 Name: NITA WHITMIRE MRN: [id number] DOB: 1941/01/20  MARIVEL MCCLARTY is a 81 y.o. year old female who sees Carollee Herter, Alferd Apa, DO for primary care. I spoke with  Francesco Runner by phone today.  What matters to the patients health and wellness today?  "I am doing very well; the headaches I was having when I saw Dr. Kendrick Fries last have gone away, no more problems there; I have adjusted my amlodipine dose back down to 5 mg and am taking it every day along with the other medications-- my blood pressures are all very good at home; I believe the amlodipine may be causing me to have hair loss, I plan to discuss with Dr. Kendrick Fries when I see her next.  I am staying active and walk 2 miles a day and following a healthy diet; I will make the appointment with the nurse soon to get the flu vaccine and do the wellness visit- I need to look at my calendar before I can schedule"  No further or ongoing care coordination needs identified   Goals Addressed             This Visit's Progress    COMPLETED: Care Coordination Activities: No follow up required   On track    Care Coordination Interventions: Evaluation of current treatment plan related to hypertension self management and patient's adherence to plan as established by provider Reviewed medications with patient and discussed importance of compliance Counseled on the importance of exercise goals with target of 150 minutes per week Advised patient, providing education and rationale, to monitor blood pressure daily and record, calling PCP for findings outside established parameters Advised patient to discuss noted ongoing hair loss that she thinks may be related to HTN medication amlodipine with provider Discussed complications of poorly controlled blood pressure such as heart disease, stroke, circulatory complications, vision complications, kidney impairment, sexual dysfunction Assessed social  determinant of health barriers Confirmed patient plans to schedule medicare annual wellness visit and flu shot for 2023-24 flu season "soon;"  explained that the [place] may be completed virtually or by telephone- she prefers to schedule independently, confirms has phone number to schedule and has visualized message in MyChart; discussed heart healthy, low salt diet and confirms patient follows; reviewed recent blood pressures at home- she monitors QD: reports ranges between 115-126/66-80; updated medication list to reflect she reports currently taking amlodipine 5 mg po QD (not 10 mg po QD) Confirmed patient remains active at baseline: reports walks 2 miles per day: positive reinforcement provided with encouragement to continue efforts at staying active           SDOH assessments and interventions completed:  Yes  SDOH Interventions Today    Flowsheet Row Most Recent Value  SDOH Interventions   Food Insecurity Interventions Intervention Not Indicated  Transportation Interventions Intervention Not Indicated  [drives self]       Care Coordination Interventions Activated:  Yes  Care Coordination Interventions:  Yes, provided   Follow up plan: No further intervention required.   Encounter Outcome:  Pt. Visit Completed   Oneta Rack, RN, BSN, CCRN Alumnus RN CM Care Coordination/ Transition of [place] Management [phone]: direct office

## 2022-03-08 NOTE — Patient Instructions (Signed)
Visit Information  Thank you for taking time to visit with me today. Please don't hesitate to contact me if I can be of assistance to you.   Following are the goals we discussed today:   Goals Addressed             This Visit's Progress    COMPLETED: Care Coordination Activities: No follow up required   On track    Care Coordination Interventions: Evaluation of current treatment plan related to hypertension self management and patient's adherence to plan as established by provider Reviewed medications with patient and discussed importance of compliance Counseled on the importance of exercise goals with target of 150 minutes per week Advised patient, providing education and rationale, to monitor blood pressure daily and record, calling PCP for findings outside established parameters Advised patient to discuss noted ongoing hair loss that she thinks may be related to HTN medication amlodipine with provider Discussed complications of poorly controlled blood pressure such as heart disease, stroke, circulatory complications, vision complications, kidney impairment, sexual dysfunction Assessed social determinant of health barriers Confirmed patient plans to schedule medicare annual wellness visit and flu shot for 2023-24 flu season "soon;"  explained that the [place] may be completed virtually or by telephone- she prefers to schedule independently, confirms has phone number to schedule and has visualized message in MyChart; discussed heart healthy, low salt diet and confirms patient follows; reviewed recent blood pressures at home- she monitors QD: reports ranges between 115-126/66-80; updated medication list to reflect she reports currently taking amlodipine 5 mg po QD (not 10 mg po QD) Confirmed patient remains active at baseline: reports walks 2 miles per day: positive reinforcement provided with encouragement to continue efforts at staying active           If you are experiencing a Mental  Health or [place] or need someone to talk to, please  call the Suicide and Crisis Lifeline: 988 call the [place] National Suicide Prevention Lifeline: 434-719-5743 or TTY: 510-757-2598 TTY 905-633-2883) to talk to a trained counselor call 1-800-273-TALK (toll free, 24 hour hotline) go to [facility] [address] [place] [phone]) call the [place]: [phone] call 911   Patient verbalizes understanding of instructions and care plan provided today and agrees to view in [place]. Active MyChart status and patient understanding of how to access instructions and care plan via MyChart confirmed with patient.     No further follow up required: no further/ ongoing care coordination needs identified  Oneta Rack, RN, BSN, CCRN Alumnus RN CM Care Coordination/ Transition of [place] Management [phone]: direct office

## 2022-03-12 NOTE — Progress Notes (Signed)
Remote pacemaker transmission.   

## 2022-03-19 ENCOUNTER — Telehealth: Payer: Self-pay | Admitting: Family Medicine

## 2022-04-08 ENCOUNTER — Other Ambulatory Visit (HOSPITAL_BASED_OUTPATIENT_CLINIC_OR_DEPARTMENT_OTHER): Payer: Self-pay

## 2022-04-11 ENCOUNTER — Ambulatory Visit (INDEPENDENT_AMBULATORY_CARE_PROVIDER_SITE_OTHER): Payer: Medicare Other | Admitting: *Deleted

## 2022-04-11 VITALS — BP 145/80 | HR 85 | Ht 63.0 in | Wt 152.4 lb

## 2022-04-11 DIAGNOSIS — Z1231 Encounter for screening mammogram for malignant neoplasm of breast: Secondary | ICD-10-CM | POA: Diagnosis not present

## 2022-04-11 DIAGNOSIS — Z23 Encounter for immunization: Secondary | ICD-10-CM | POA: Diagnosis not present

## 2022-04-11 DIAGNOSIS — Z0000 Encounter for general adult medical examination without abnormal findings: Secondary | ICD-10-CM

## 2022-04-11 NOTE — Patient Instructions (Signed)
[name] , Thank you for taking time to come for your Medicare Wellness Visit. I appreciate your ongoing commitment to your health goals. Please review the following plan we discussed and let me know if I can assist you in the future.   These are the goals we discussed:  Goals      Patient Stated     Increase activity        This is a list of the screening recommended for you and due dates:  Health Maintenance  Topic Date Due   Tetanus Vaccine  Never done   Zoster (Shingles) Vaccine (1 of 2) Never done   COVID-19 Vaccine (4 - Pfizer risk series) 07/03/2020   Flu Shot  01/29/2022   Pneumonia Vaccine  Completed   DEXA scan (bone density measurement)  Completed   HPV Vaccine  Aged Out     Next appointment: Follow up in one year for your annual wellness visit.   Preventive Care 36 Years and Older, Female Preventive care refers to lifestyle choices and visits with your health care provider that can promote health and wellness. What does preventive care include? A yearly physical exam. This is also called an annual well check. Dental exams once or twice a year. Routine eye exams. Ask your health care provider how often you should have your eyes checked. Personal lifestyle choices, including: Daily care of your teeth and gums. Regular physical activity. Eating a healthy diet. Avoiding tobacco and drug use. Limiting alcohol use. Practicing safe sex. Taking low-dose aspirin every day. Taking vitamin and mineral supplements as recommended by your health care provider. What happens during an annual well check? The services and screenings done by your health care provider during your annual well check will depend on your age, overall health, lifestyle risk factors, and family history of disease. Counseling  Your health care provider may ask you questions about your: Alcohol use. Tobacco use. Drug use. Emotional well-being. Home and relationship well-being. Sexual  activity. Eating habits. History of falls. Memory and ability to understand (cognition). Work and work Statistician. Reproductive health. Screening  You may have the following tests or measurements: Height, weight, and BMI. Blood pressure. Lipid and cholesterol levels. These may be checked every 5 years, or more frequently if you are over 58 years old. Skin check. Lung cancer screening. You may have this screening every year starting at age 39 if you have a 30-pack-year history of smoking and currently smoke or have quit within the past 15 years. Fecal occult blood test (FOBT) of the stool. You may have this test every year starting at age 72. Flexible sigmoidoscopy or colonoscopy. You may have a sigmoidoscopy every 5 years or a colonoscopy every 10 years starting at age 47. Hepatitis C blood test. Hepatitis B blood test. Sexually transmitted disease (STD) testing. Diabetes screening. This is done by checking your blood sugar (glucose) after you have not eaten for a while (fasting). You may have this done every 1-3 years. Bone density scan. This is done to screen for osteoporosis. You may have this done starting at age 21. Mammogram. This may be done every 1-2 years. Talk to your health care provider about how often you should have regular mammograms. Talk with your health care provider about your test results, treatment options, and if necessary, the need for more tests. Vaccines  Your health care provider may recommend certain vaccines, such as: Influenza vaccine. This is recommended every year. Tetanus, diphtheria, and acellular pertussis (Tdap, Td)  vaccine. You may need a Td booster every 10 years. Zoster vaccine. You may need this after age 48. Pneumococcal 13-valent conjugate (PCV13) vaccine. One dose is recommended after age 26. Pneumococcal polysaccharide (PPSV23) vaccine. One dose is recommended after age 26. Talk to your health care provider about which screenings and vaccines  you need and how often you need them. This information is not intended to replace advice given to you by your health care provider. Make sure you discuss any questions you have with your health care provider. Document Released: 07/14/2015 Document Revised: 03/06/2016 Document Reviewed: 04/18/2015 Elsevier Interactive Patient Education  2017 [place] Prevention in the Home Falls can cause injuries. They can happen to people of all ages. There are many things you can do to make your home safe and to help prevent falls. What can I do on the outside of my home? Regularly fix the edges of walkways and driveways and fix any cracks. Remove anything that might make you trip as you walk through a door, such as a raised step or threshold. Trim any bushes or trees on the path to your home. Use bright outdoor lighting. Clear any walking paths of anything that might make someone trip, such as rocks or tools. Regularly check to see if handrails are loose or broken. Make sure that both sides of any steps have handrails. Any raised decks and porches should have guardrails on the edges. Have any leaves, snow, or ice cleared regularly. Use sand or salt on walking paths during winter. Clean up any spills in your garage right away. This includes oil or grease spills. What can I do in the bathroom? Use night lights. Install grab bars by the toilet and in the tub and shower. Do not use towel bars as grab bars. Use non-skid mats or decals in the tub or shower. If you need to sit down in the shower, use a plastic, non-slip stool. Keep the floor dry. Clean up any water that spills on the floor as soon as it happens. Remove soap buildup in the tub or shower regularly. Attach bath mats securely with double-sided non-slip rug tape. Do not have throw rugs and other things on the floor that can make you trip. What can I do in the bedroom? Use night lights. Make sure that you have a light by your bed that  is easy to reach. Do not use any sheets or blankets that are too big for your bed. They should not hang down onto the floor. Have a firm chair that has side arms. You can use this for support while you get dressed. Do not have throw rugs and other things on the floor that can make you trip. What can I do in the kitchen? Clean up any spills right away. Avoid walking on wet floors. Keep items that you use a lot in easy-to-reach places. If you need to reach something above you, use a strong step stool that has a grab bar. Keep electrical cords out of the way. Do not use floor polish or wax that makes floors slippery. If you must use wax, use non-skid floor wax. Do not have throw rugs and other things on the floor that can make you trip. What can I do with my stairs? Do not leave any items on the stairs. Make sure that there are handrails on both sides of the stairs and use them. Fix handrails that are broken or loose. Make sure that handrails are as long  as the stairways. Check any carpeting to make sure that it is firmly attached to the stairs. Fix any carpet that is loose or worn. Avoid having throw rugs at the top or bottom of the stairs. If you do have throw rugs, attach them to the floor with carpet tape. Make sure that you have a light switch at the top of the stairs and the bottom of the stairs. If you do not have them, ask someone to add them for you. What else can I do to help prevent falls? Wear shoes that: Do not have high heels. Have rubber bottoms. Are comfortable and fit you well. Are closed at the toe. Do not wear sandals. If you use a stepladder: Make sure that it is fully opened. Do not climb a closed stepladder. Make sure that both sides of the stepladder are locked into place. Ask someone to hold it for you, if possible. Clearly mark and make sure that you can see: Any grab bars or handrails. First and last steps. Where the edge of each step is. Use tools that help you  move around (mobility aids) if they are needed. These include: Canes. Walkers. Scooters. Crutches. Turn on the lights when you go into a dark area. Replace any light bulbs as soon as they burn out. Set up your furniture so you have a clear path. Avoid moving your furniture around. If any of your floors are uneven, fix them. If there are any pets around you, be aware of where they are. Review your medicines with your doctor. Some medicines can make you feel dizzy. This can increase your chance of falling. Ask your doctor what other things that you can do to help prevent falls. This information is not intended to replace advice given to you by your health care provider. Make sure you discuss any questions you have with your health care provider. Document Released: 04/13/2009 Document Revised: 11/23/2015 Document Reviewed: 07/22/2014 Elsevier Interactive Patient Education  2017 [employer].

## 2022-04-11 NOTE — Progress Notes (Signed)
Subjective:   [name] is a 80 y.o. female who presents for Medicare Annual (Subsequent) preventive examination.  Review of Systems    Defer to PCP Cardiac Risk Factors include: advanced age (>43mn, >>59women);hypertension;dyslipidemia     Objective:    Today's Vitals   04/11/22 1304 04/11/22 1311  BP: (!) 145/80   Pulse: 85   Weight: 152 lb 6.4 oz (69.1 kg)   Height: '5\' 3"'$  (1.6 m)   PainSc:  4    Body mass index is 27 kg/m.     04/11/2022    1:15 PM 06/06/2021    9:59 AM 03/21/2021    3:40 PM 09/13/2020   11:59 AM 07/25/2016   11:38 AM 06/03/2016    9:36 AM 03/20/2016    1:45 PM  Advanced Directives  Does Patient Have a Medical Advance Directive? No No No No No No No  Would patient like information on creating a medical advance directive? No - Patient declined No - Patient declined Yes (MAU/Ambulatory/Procedural Areas - Information given) No - Patient declined No - Patient declined No - Patient declined No - patient declined information    Current Medications (verified) Outpatient Encounter Medications as of 04/11/2022  Medication Sig   acetaminophen (TYLENOL) 500 MG tablet Take 1,000 mg by mouth daily as needed for moderate pain.   amLODipine (NORVASC) 10 MG tablet Take 1 tablet (10 mg total) by mouth daily.   apixaban (ELIQUIS) 5 MG TABS tablet Take 1 tablet (5 mg total) by mouth 2 (two) times daily.   azelastine (ASTELIN) 0.1 % nasal spray Place 1 spray into both nostrils 2 (two) times daily. Use in each nostril as directed   denosumab (PROLIA) 60 MG/ML SOSY injection Inject 60 mg into the skin every 6 (six) months.   diphenhydramine-acetaminophen (TYLENOL PM) 25-500 MG TABS tablet Take 2 tablets by mouth at bedtime as needed (sleep/pain).   fluticasone (FLONASE) 50 MCG/ACT nasal spray Place 2 sprays into both nostrils daily as needed for allergies or rhinitis.   hydrochlorothiazide (MICROZIDE) 12.5 MG capsule Take 1 capsule (12.5 mg total) by mouth daily.    levocetirizine (XYZAL) 5 MG tablet Take 1 tablet (5 mg total) by mouth every evening.   levothyroxine (SYNTHROID) 100 MCG tablet Take 1 tablet (100 mcg total) by mouth daily before breakfast.   lisinopril (ZESTRIL) 40 MG tablet Take 1 tablet (40 mg total) by mouth daily.   loratadine (ALLERGY RELIEF, LORATADINE,) 10 MG tablet TAKE 1 TABLET (10 MG TOTAL) BY MOUTH DAILY AS NEEDED FOR ALLERGIES.   [DISCONTINUED] gabapentin (NEURONTIN) 100 MG capsule Take 1 capsule (100 mg total) by mouth 3 (three) times daily.   [DISCONTINUED] pantoprazole (PROTONIX) 40 MG tablet Take 1 tablet (40 mg total) by mouth 2 (two) times daily before a meal.   Facility-Administered Encounter Medications as of 04/11/2022  Medication   aminophylline injection 150 mg    Allergies (verified) Tape and Codeine   History: Past Medical History:  Diagnosis Date   Atrioventricular block, complete ([facility]    a. 08/2011 Upgrade of PPM to MDT Adapta L Dual Chamber PPM ser # [id number].   Cardiac pacemaker in situ    Contact dermatitis and eczema    unspec cause   Dairy product intolerance    Depression    Diastolic dysfunction    GERD (gastroesophageal reflux disease)    Hiatal hernia    History of diverticulitis of colon    2003  perforated diverticulitis w/ surgical intervention  History of kidney stones    age 74   History of peripheral edema    lower extremities   History of thyroid nodule    multinodular goiter  s/p  total thyroidectomy 2014   HTN (hypertension)    Hydronephrosis, left    Hypothyroidism    Mild sleep apnea    per study 04-14-2014   Nocturia    NSVT (nonsustained ventricular tachycardia) (HCC)    Osteoporosis    PAF (paroxysmal atrial fibrillation) (HCC)    PONV (postoperative nausea and vomiting)    Pulmonary nodules    stable per last ct   Renal cell carcinoma of left kidney ([place])    Superficial thrombophlebitis    Past Surgical History:  Procedure Laterality Date   CARDIOVASCULAR  STRESS TEST  04-20-2015  dr Caryl Comes   normal nuclear study/  no ischemia/  normal LV function and wall motion, ef 74%   COLONOSCOPY  01/19/2013   COLOSTOMY TAKEDOWN  07-22-2002   CYSTOSCOPY WITH RETROGRADE PYELOGRAM, URETEROSCOPY AND STENT PLACEMENT Left 07/28/2015   Procedure: CYSTOSCOPY WITH LEFT RETROGRADE PYELOGRAM, POSSIBLE LEFT URETEROSCOPY AND STENT PLACEMENT;  Surgeon: Franchot Gallo, MD;  Location: [facility];  Service: Urology;  Laterality: Left;   EXPLORATORY LAPARTOMY SIGMOID COLECTOMY/ COLOSTOMY  04-28-2002   perforated diverticulitis   KYPHOPLASTY     LAPAROSCOPY VENTRAL HERNIA REPAIR/  EXTENSIVE LYSIS ADHESIONS  05-04-2004   PACEMAKER GENERATOR CHANGE Left 09/16/2011   MDT ADDRL1 pacemaker   PACEMAKER PLACEMENT  05-11-2003     medtronic   ROBOT ASSISTED LAPAROSCOPIC NEPHRECTOMY Left 03/20/2016   Procedure: XI ROBOTIC ASSISTED LAPAROSCOPIC RETROPERITONEAL NEPHRECTOMY;  Surgeon: Alexis Frock, MD;  Location: [facility];  Service: Urology;  Laterality: Left;   THYROIDECTOMY N/A 08/07/2012   Procedure: THYROIDECTOMY;  Surgeon: Earnstine Regal, MD;  Location: [facility];  Service: General;  Laterality: N/A;   TRANSTHORACIC ECHOCARDIOGRAM  10-12-2014   moderate focal basal LVH,  ef 55-60%,  grade 2 diastolic dysfunction, mild AV calcification without stenosis,  mild MR,  trivial PR   Family History  Problem Relation Age of Onset   Heart disease Mother        Stent in her 39s   Stroke Mother    Hypertension Mother    Heart failure Father    Diverticulosis Sister    Hypertension Brother    Heart disease Brother    Hypertension Brother    Heart attack Neg Hx    Social History   Socioeconomic History   Marital status: Single    Spouse name: Not on file   Number of children: 2   Years of education: 16   Highest education level: Not on file  Occupational History   Occupation: Retired Production assistant, radio for the [employer]    Employer: RETIRED  Tobacco Use    Smoking status: Former    Years: 15.00    Types: Cigarettes    Quit date: 10/06/1979    Years since quitting: 42.5   Smokeless tobacco: Never  Vaping Use   Vaping Use: Never used  Substance and Sexual Activity   Alcohol use: No   Drug use: No   Sexual activity: Not on file  Other Topics Concern   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  She was married for 22 years, divorced, and has remained single. She has  1 grown son, 1 grown daughter.  She has 3 grandchildren, 2 of her children   live in [place].  She is retired.  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 She had a long term companion, 32 years, who passed away September 26, 2010.   Social Determinants of Health   Financial Resource Strain: Low Risk  (03/21/2021)   Overall Financial Resource Strain (CARDIA)    Difficulty of Paying Living Expenses: Not hard at all  Food Insecurity: No Food Insecurity (03/08/2022)   Hunger Vital Sign    Worried About Running Out of Food in the Last Year: Never true    Ran Out of Food in the Last Year: Never true  Transportation Needs: No Transportation Needs (03/08/2022)   PRAPARE - Hydrologist (Medical): No    Lack of Transportation (Non-Medical): No  Physical Activity: Insufficiently Active (03/21/2021)   Exercise Vital Sign    Days of Exercise per Week: 4 days    Minutes of Exercise per Session: 30 min  Stress: No Stress Concern Present (03/21/2021)   [place]    Feeling of Stress : Not at all  Social Connections: Socially Isolated (03/21/2021)   Social Connection and Isolation Panel [NHANES]    Frequency of Communication with Friends and Family: More than three times a week    Frequency of Social Gatherings with Friends and Family: More than three times a week    Attends  Religious Services: Never    Marine scientist or Organizations: No    Attends Music therapist: Never    Marital Status: Divorced    Tobacco Counseling Counseling given: Not Answered   Clinical Intake:  Pre-visit preparation completed: Yes  Pain : 0-10 Pain Score: 4  Pain Location: Back Pain Orientation: Left, Lower Pain Descriptors / Indicators: Aching Pain Onset: 1 to 4 weeks ago Pain Frequency: Several days a week        How often do you need to have someone help you when you read instructions, pamphlets, or other written materials from your doctor or pharmacy?: 1 - Never  Diabetic? No  Activities of Daily Living    04/11/2022    1:17 PM  In your present state of health, do you have any difficulty performing the following activities:  Hearing? 0  Vision? 0  Difficulty concentrating or making decisions? 0  Walking or climbing stairs? 0  Dressing or bathing? 0  Doing errands, shopping? 0  Preparing Food and eating ? N  Using the Toilet? N  In the past six months, have you accidently leaked urine? N  Do you have problems with loss of bowel control? N  Managing your Medications? N  Managing your Finances? N  Housekeeping or managing your Housekeeping? N    Patient Care Team: Carollee Herter, Alferd Apa, DO as PCP - General (Family Medicine) Deboraha Sprang, MD as PCP - Cardiology (Cardiology) Alexis Frock, MD as Consulting Physician (Urology) Deboraha Sprang, MD as Consulting Physician (Cardiology) Ronald Lobo, MD as Consulting Physician (Gastroenterology)  Indicate any recent Medical Services you may have received from other than [facility] providers in the past year (date may be approximate).     Assessment:   This is a routine wellness examination for Daffney.  Hearing/Vision screen No results found.  Dietary issues and exercise activities discussed: Current Exercise Habits: Home exercise routine, Type of exercise:  walking;treadmill;stretching,  Time (Minutes): 30, Frequency (Times/Week): 5, Weekly Exercise (Minutes/Week): 150, Intensity: Mild, Exercise limited by: None identified   Goals Addressed             This Visit's Progress    Patient Stated   On track    Increase activity       Depression Screen    04/11/2022    1:17 PM 03/21/2021    3:44 PM 06/03/2016    8:47 AM 02/28/2015   10:45 AM 11/12/2013    1:48 PM  PHQ 2/9 Scores  PHQ - 2 Score 0 0 0 0 0    Fall Risk    04/11/2022    1:17 PM 03/21/2021    3:43 PM 09/13/2020   10:23 AM 05/26/2019   10:31 AM 05/21/2018   10:46 AM  [place] in the past year? 0 0 0 0 0  Comment    [employer] Telephone Survey: data to providers prior to load [employer] Survey: data to providers prior to load  Number falls in past yr: 0 0 0    Injury with Fall? 0 0 0    Risk for fall due to : No Fall Risks  No Fall Risks    Follow up Falls evaluation completed Falls prevention discussed Falls evaluation completed      [place]:  Any stairs in or around the home? No  If so, are there any without handrails?  No stairs Home free of loose throw rugs in walkways, pet beds, electrical cords, etc? Yes  Adequate lighting in your home to reduce risk of falls? Yes   ASSISTIVE DEVICES UTILIZED TO PREVENT FALLS:  Life alert? No  Use of a cane, walker or w/c? No  Grab bars in the bathroom? Yes  Shower chair or bench in shower? No  Elevated toilet seat or a handicapped toilet? No   TIMED UP AND GO:  Was the test performed? Yes .  Length of time to ambulate 10 feet: 5 sec.   Gait steady and fast without use of assistive device  Cognitive Function:    06/03/2016    9:39 AM  MMSE - Mini Mental State Exam  Orientation to time 5  Orientation to Place 5  Registration 3  Attention/ Calculation 5  Recall 3  Language- name 2 objects 2  Language- repeat 1  Language- follow 3 step command 3  Language- read &  follow direction 1  Write a sentence 1  Copy design 1  Total score 30        04/11/2022    1:31 PM  6CIT Screen  What Year? 0 points  What month? 0 points  What time? 0 points  Count back from 20 0 points  Months in reverse 0 points  Repeat phrase 0 points  Total Score 0 points    Immunizations Immunization History  Administered Date(s) Administered   Fluad Quad(high Dose 65+) 03/26/2019, 04/03/2020, 03/27/2021   Influenza Split 04/10/2011, 03/10/2012   Influenza Whole 03/24/2008, 04/11/2009   Influenza, High Dose Seasonal PF 03/28/2017   Influenza,inj,Quad PF,6+ Mos 03/24/2013, 03/24/2014, 04/04/2015, 03/21/2016   Influenza-Unspecified 03/11/2018   PFIZER(Purple Top)SARS-COV-2 Vaccination 08/09/2019, 09/06/2019, 05/08/2020   Pneumococcal Conjugate-13 06/02/2013   Pneumococcal Polysaccharide-23 08/29/2014    TDAP status: Due, Education has been provided regarding the importance of this vaccine. Advised may receive this vaccine at local pharmacy or Health Dept. Aware to provide a copy of the vaccination record if  obtained from local pharmacy or Health Dept. Verbalized acceptance and understanding.  Flu Vaccine status: Completed at today's visit  Pneumococcal vaccine status: Up to date  Covid-19 vaccine status: Information provided on how to obtain vaccines.   Qualifies for Shingles Vaccine? Yes   Zostavax completed No   Shingrix Completed?: No.    Education has been provided regarding the importance of this vaccine. Patient has been advised to call insurance company to determine out of pocket expense if they have not yet received this vaccine. Advised may also receive vaccine at local pharmacy or Health Dept. Verbalized acceptance and understanding.  Screening Tests Health Maintenance  Topic Date Due   TETANUS/TDAP  Never done   Zoster Vaccines- Shingrix (1 of 2) Never done   COVID-19 Vaccine (4 - Pfizer risk series) 07/03/2020   INFLUENZA VACCINE  01/29/2022    Pneumonia Vaccine 26+ Years old  Completed   DEXA SCAN  Completed   HPV VACCINES  Aged Out    Health Maintenance  Health Maintenance Due  Topic Date Due   TETANUS/TDAP  Never done   Zoster Vaccines- Shingrix (1 of 2) Never done   COVID-19 Vaccine (4 - Pfizer risk series) 07/03/2020   INFLUENZA VACCINE  01/29/2022    Colorectal cancer screening: No longer required.   Mammogram status: Ordered 04/11/22. Pt provided with contact info and advised to call to schedule appt.   Bone Density status: Completed 5/81/21. Results reflect: Bone density results: OSTEOPOROSIS. Repeat every 2 years.  Lung Cancer Screening: (Low Dose CT Chest recommended if Age 30-80 years, 30 pack-year currently smoking OR have quit w/in 15years.) does not qualify.   Lung Cancer Screening Referral: N/a  Additional Screening:  Hepatitis C Screening: does not qualify;   Vision Screening: Recommended annual ophthalmology exams for early detection of glaucoma and other disorders of the eye. Is the patient up to date with their annual eye exam?  Yes  Who is the provider or what is the name of the office in which the patient attends annual eye exams? [facility]. If pt is not established with a provider, would they like to be referred to a provider to establish care? No .   Dental Screening: Recommended annual dental exams for proper oral hygiene  Community Resource Referral / Chronic Care Management: CRR required this visit?  No   CCM required this visit?  No      Plan:     I have personally reviewed and noted the following in the patient's chart:   Medical and social history Use of alcohol, tobacco or illicit drugs  Current medications and supplements including opioid prescriptions. Patient is not currently taking opioid prescriptions. Functional ability and status Nutritional status Physical activity Advanced directives List of other physicians Hospitalizations, surgeries, and ER visits in  previous 12 months Vitals Screenings to include cognitive, depression, and falls Referrals and appointments  In addition, I have reviewed and discussed with patient certain preventive protocols, quality metrics, and best practice recommendations. A written personalized care plan for preventive services as well as general preventive health recommendations were provided to patient.     Beatris Ship, [place]   04/11/2022   Nurse Notes: None

## 2022-04-13 ENCOUNTER — Other Ambulatory Visit: Payer: Self-pay | Admitting: Family Medicine

## 2022-04-13 ENCOUNTER — Other Ambulatory Visit: Payer: Self-pay | Admitting: Physician Assistant

## 2022-04-13 DIAGNOSIS — E039 Hypothyroidism, unspecified: Secondary | ICD-10-CM

## 2022-04-13 DIAGNOSIS — I10 Essential (primary) hypertension: Secondary | ICD-10-CM

## 2022-04-18 ENCOUNTER — Telehealth (HOSPITAL_BASED_OUTPATIENT_CLINIC_OR_DEPARTMENT_OTHER): Payer: Self-pay

## 2022-04-25 ENCOUNTER — Other Ambulatory Visit (HOSPITAL_BASED_OUTPATIENT_CLINIC_OR_DEPARTMENT_OTHER): Payer: Self-pay

## 2022-04-25 ENCOUNTER — Other Ambulatory Visit: Payer: Self-pay | Admitting: Family Medicine

## 2022-04-25 ENCOUNTER — Ambulatory Visit (INDEPENDENT_AMBULATORY_CARE_PROVIDER_SITE_OTHER): Payer: Medicare Other | Admitting: Family Medicine

## 2022-04-25 ENCOUNTER — Encounter: Payer: Self-pay | Admitting: Family Medicine

## 2022-04-25 ENCOUNTER — Ambulatory Visit (HOSPITAL_BASED_OUTPATIENT_CLINIC_OR_DEPARTMENT_OTHER)
Admission: RE | Admit: 2022-04-25 | Discharge: 2022-04-25 | Disposition: A | Discharge status: Home / Self Care | Payer: Medicare Other | Source: Ambulatory Visit | Attending: Family Medicine | Admitting: Family Medicine

## 2022-04-25 VITALS — BP 140/80 | HR 78 | Temp 97.8°F | Resp 18 | Ht 63.0 in | Wt 151.6 lb

## 2022-04-25 DIAGNOSIS — Z8781 Personal history of (healed) traumatic fracture: Secondary | ICD-10-CM | POA: Diagnosis present

## 2022-04-25 DIAGNOSIS — M5441 Lumbago with sciatica, right side: Secondary | ICD-10-CM | POA: Diagnosis present

## 2022-04-25 DIAGNOSIS — M5442 Lumbago with sciatica, left side: Secondary | ICD-10-CM

## 2022-04-25 MED ORDER — TRAMADOL HCL 50 MG PO TABS
50.0000 mg | ORAL_TABLET | Freq: Three times a day (TID) | ORAL | 0 refills | Status: AC | PRN
  Filled 2022-04-25: qty 15, 5d supply, fill #0

## 2022-04-25 MED ORDER — CYCLOBENZAPRINE HCL 10 MG PO TABS
10.0000 mg | ORAL_TABLET | Freq: Three times a day (TID) | ORAL | 0 refills | Status: DC | PRN
  Filled 2022-04-25: qty 30, 10d supply, fill #0

## 2022-04-25 NOTE — Assessment & Plan Note (Signed)
Check xray 

## 2022-04-25 NOTE — Progress Notes (Signed)
Subjective:   By signing my name below, I, [name], attest that this documentation has been prepared under the direction and in the presence of Daisy Held, DO. 04/25/2022    Patient ID: [name], female    DOB: Sep 19, 1940, 81 y.o.   MRN: [id number]  Chief Complaint  Patient presents with   Back Pain    Pt states back pain started x2-3 weeks ago. No falls or injuries. Pt states pain starts from mid back down he right leg and to the glute on the left side.    HPI Patient is in today for a office visit.   She complains of severe lower back pain for the past 2 weeks. Her pain radiates from her lower back to her right thigh and left buttock. She is applying ice to her lower back. She has been walking more frequently on steeper elevation than she is used to. This is due to moving to a new home. She has difficulty sleeping due to her pain.  She also complains of spasms in her legs and lower back. Her spasms started prior to her back pain. She has a history of kyphoplasty procedure above where her back is currently hurting.    Past Medical History:  Diagnosis Date   Atrioventricular block, complete ([place])    a. 08/2011 Upgrade of PPM to MDT Adapta L Dual Chamber PPM ser # [id number].   Cardiac pacemaker in situ    Contact dermatitis and eczema    unspec cause   Dairy product intolerance    Depression    Diastolic dysfunction    GERD (gastroesophageal reflux disease)    Hiatal hernia    History of diverticulitis of colon    2003  perforated diverticulitis w/ surgical intervention   History of kidney stones    age 77   History of peripheral edema    lower extremities   History of thyroid nodule    multinodular goiter  s/p  total thyroidectomy 2014   HTN (hypertension)    Hydronephrosis, left    Hypothyroidism    Mild sleep apnea    per study 04-14-2014   Nocturia    NSVT (nonsustained ventricular tachycardia) (H[facility])    Osteoporosis    PAF (paroxysmal atrial  fibrillation) (H[facility])    PONV (postoperative nausea and vomiting)    Pulmonary nodules    stable per last ct   Renal cell carcinoma of left kidney ([place])    Superficial thrombophlebitis     Past Surgical History:  Procedure Laterality Date   CARDIOVASCULAR STRESS TEST  04-20-2015  dr Caryl Comes   normal nuclear study/  no ischemia/  normal LV function and wall motion, ef 74%   COLONOSCOPY  01/19/2013   COLOSTOMY TAKEDOWN  07-22-2002   CYSTOSCOPY WITH RETROGRADE PYELOGRAM, URETEROSCOPY AND STENT PLACEMENT Left 07/28/2015   Procedure: CYSTOSCOPY WITH LEFT RETROGRADE PYELOGRAM, POSSIBLE LEFT URETEROSCOPY AND STENT PLACEMENT;  Surgeon: Franchot Gallo, MD;  Location: [facility];  Service: Urology;  Laterality: Left;   EXPLORATORY LAPARTOMY SIGMOID COLECTOMY/ COLOSTOMY  04-28-2002   perforated diverticulitis   KYPHOPLASTY     LAPAROSCOPY VENTRAL HERNIA REPAIR/  EXTENSIVE LYSIS ADHESIONS  05-04-2004   PACEMAKER GENERATOR CHANGE Left 09/16/2011   MDT ADDRL1 pacemaker   PACEMAKER PLACEMENT  05-11-2003     medtronic   ROBOT ASSISTED LAPAROSCOPIC NEPHRECTOMY Left 03/20/2016   Procedure: XI ROBOTIC ASSISTED LAPAROSCOPIC RETROPERITONEAL NEPHRECTOMY;  Surgeon: Alexis Frock, MD;  Location: [facility];  Service: Urology;  Laterality: Left;   THYROIDECTOMY N/A 08/07/2012   Procedure: THYROIDECTOMY;  Surgeon: Earnstine Regal, MD;  Location: [facility];  Service: General;  Laterality: N/A;   TRANSTHORACIC ECHOCARDIOGRAM  10-12-2014   moderate focal basal LVH,  ef 55-60%,  grade 2 diastolic dysfunction, mild AV calcification without stenosis,  mild MR,  trivial PR    Family History  Problem Relation Age of Onset   Heart disease Mother        Stent in her 67s   Stroke Mother    Hypertension Mother    Heart failure Father    Diverticulosis Sister    Hypertension Brother    Heart disease Brother    Hypertension Brother    Heart attack Neg Hx     Social History   Socioeconomic History    Marital status: Single    Spouse name: Not on file   Number of children: 2   Years of education: 16   Highest education level: Not on file  Occupational History   Occupation: Retired Production assistant, radio for the [employer]    Employer: RETIRED  Tobacco Use   Smoking status: Former    Years: 15.00    Types: Cigarettes    Quit date: 10/06/1979    Years since quitting: 42.5   Smokeless tobacco: Never  Vaping Use   Vaping Use: Never used  Substance and Sexual Activity   Alcohol use: No   Drug use: No   Sexual activity: Not on file  Other Topics Concern   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  She was married for 22 years, divorced, and has remained single. She has 1 grown son, 1 grown daughter.  She has 3 grandchildren, 2 of her children   live in [place].  She is retired.  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 She had a long term companion, 32 years, who passed away 08-13-10.   Social Determinants of Health   Financial Resource Strain: Low Risk  (03/21/2021)   Overall Financial Resource Strain (CARDIA)    Difficulty of Paying Living Expenses: Not hard at all  Food Insecurity: No Food Insecurity (03/08/2022)   Hunger Vital Sign    Worried About Running Out of Food in the Last Year: Never true    Ran Out of Food in the Last Year: Never true  Transportation Needs: No Transportation Needs (03/08/2022)   PRAPARE - Hydrologist (Medical): No    Lack of Transportation (Non-Medical): No  Physical Activity: Insufficiently Active (03/21/2021)   Exercise Vital Sign    Days of Exercise per Week: 4 days    Minutes of Exercise per Session: 30 min  Stress: No Stress Concern Present (03/21/2021)   [place]    Feeling of Stress : Not at all  Social Connections:  Socially Isolated (03/21/2021)   Social Connection and Isolation Panel [NHANES]    Frequency of Communication with Friends and Family: More than three times a week    Frequency of Social Gatherings with Friends and Family: More than three times a week    Attends Religious Services: Never    Marine scientist or Organizations: No    Attends Archivist Meetings: Never    Marital  Status: Divorced  Human resources officer Violence: Not At Risk (03/21/2021)   Humiliation, Afraid, Rape, and Kick questionnaire    Fear of Current or Ex-Partner: No    Emotionally Abused: No    Physically Abused: No    Sexually Abused: No    Outpatient Medications Prior to Visit  Medication Sig Dispense Refill   acetaminophen (TYLENOL) 500 MG tablet Take 1,000 mg by mouth daily as needed for moderate pain.     amLODipine (NORVASC) 10 MG tablet Take 1 tablet by mouth once daily 90 tablet 1   apixaban (ELIQUIS) 5 MG TABS tablet Take 1 tablet (5 mg total) by mouth 2 (two) times daily. 180 tablet 1   azelastine (ASTELIN) 0.1 % nasal spray Place 1 spray into both nostrils 2 (two) times daily. Use in each nostril as directed 30 mL 12   denosumab (PROLIA) 60 MG/ML SOSY injection Inject 60 mg into the skin every 6 (six) months.     diphenhydramine-acetaminophen (TYLENOL PM) 25-500 MG TABS tablet Take 2 tablets by mouth at bedtime as needed (sleep/pain).     fluticasone (FLONASE) 50 MCG/ACT nasal spray Place 2 sprays into both nostrils daily as needed for allergies or rhinitis. 16 g 5   hydrochlorothiazide (MICROZIDE) 12.5 MG capsule Take 1 capsule (12.5 mg total) by mouth daily. 90 capsule 3   levocetirizine (XYZAL) 5 MG tablet Take 1 tablet (5 mg total) by mouth every evening. 90 tablet 3   levothyroxine (SYNTHROID) 100 MCG tablet Take 1 tablet (100 mcg total) by mouth daily before breakfast. Needs office visit for additional refills 90 tablet 0   lisinopril (ZESTRIL) 40 MG tablet Take 1 tablet (40 mg total) by  mouth daily. 90 tablet 1   loratadine (ALLERGY RELIEF, LORATADINE,) 10 MG tablet TAKE 1 TABLET (10 MG TOTAL) BY MOUTH DAILY AS NEEDED FOR ALLERGIES. 100 tablet 1   Facility-Administered Medications Prior to Visit  Medication Dose Route Frequency Provider Last Rate Last Admin   aminophylline injection 150 mg  150 mg Intravenous BID PRN Dorothy Spark, MD   150 mg at 04/20/15 1200    Allergies  Allergen Reactions   Tape Rash   Codeine Nausea And Vomiting    Review of Systems  Musculoskeletal:  Positive for back pain (lower back).       (+)left buttock pain (+)pain in posterior right thigh  (+)muscle spasms in lower back (+)muscle spasms in both legs       Objective:    Physical Exam Constitutional:      General: She is not in acute distress.    Appearance: Normal appearance. She is not ill-appearing.  HENT:     Head: Normocephalic and atraumatic.     Right Ear: External ear normal.     Left Ear: External ear normal.  Eyes:     Extraocular Movements: Extraocular movements intact.     Pupils: Pupils are equal, round, and reactive to light.  Cardiovascular:     Rate and Rhythm: Normal rate and regular rhythm.     Heart sounds: Normal heart sounds. No murmur heard.    No gallop.  Pulmonary:     Effort: Pulmonary effort is normal. No respiratory distress.     Breath sounds: Normal breath sounds. No wheezing or rales.  Musculoskeletal:     Right ankle: Normal.     Left ankle: Normal.     Right foot: Normal.     Left foot: Normal.     Comments: 3/5 Left  hip flexion with pain 5/5 strength in right hip flexion with pain 45 degree rom with supine right leg raise Pain with supine left leg raise  Skin:    General: Skin is warm and dry.  Neurological:     Mental Status: She is alert and oriented to person, place, and time.  Psychiatric:        Judgment: Judgment normal.     BP (!) 140/80 (BP Location: Left Arm, Patient Position: Sitting, Cuff Size: Normal)   Pulse  78   Temp 97.8 F (36.6 C) (Oral)   Resp 18   Ht 5' 3" (1.6 m)   Wt 151 lb 9.6 oz (68.8 kg)   SpO2 98%   BMI 26.85 kg/m  Wt Readings from Last 3 Encounters:  04/25/22 151 lb 9.6 oz (68.8 kg)  04/11/22 152 lb 6.4 oz (69.1 kg)  11/20/21 152 lb 9.6 oz (69.2 kg)    Diabetic Foot Exam - Simple   No data filed    Lab Results  Component Value Date   WBC 5.0 08/27/2021   HGB 12.9 08/27/2021   HCT 38.8 08/27/2021   PLT 154.0 08/27/2021   GLUCOSE 92 08/27/2021   CHOL 175 07/12/2019   TRIG 98.0 07/12/2019   HDL 49.90 07/12/2019   LDLCALC 106 (H) 07/12/2019   ALT 13 08/27/2021   AST 23 08/27/2021   NA 136 08/27/2021   K 5.0 08/27/2021   CL 99 08/27/2021   CREATININE 1.20 08/27/2021   BUN 26 (H) 08/27/2021   CO2 29 08/27/2021   TSH 1.03 11/22/2021   INR 1.01 11/04/2013   HGBA1C 5.5 06/07/2016    Lab Results  Component Value Date   TSH 1.03 11/22/2021   Lab Results  Component Value Date   WBC 5.0 08/27/2021   HGB 12.9 08/27/2021   HCT 38.8 08/27/2021   MCV 83.1 08/27/2021   PLT 154.0 08/27/2021   Lab Results  Component Value Date   NA 136 08/27/2021   K 5.0 08/27/2021   CO2 29 08/27/2021   GLUCOSE 92 08/27/2021   BUN 26 (H) 08/27/2021   CREATININE 1.20 08/27/2021   BILITOT 0.7 08/27/2021   ALKPHOS 47 08/27/2021   AST 23 08/27/2021   ALT 13 08/27/2021   PROT 7.2 08/27/2021   ALBUMIN 4.7 08/27/2021   CALCIUM 9.8 08/27/2021   ANIONGAP 11 06/06/2021   EGFR 49 (L) 09/29/2020   GFR 41.99 (L) 08/27/2021   Lab Results  Component Value Date   CHOL 175 07/12/2019   Lab Results  Component Value Date   HDL 49.90 07/12/2019   Lab Results  Component Value Date   LDLCALC 106 (H) 07/12/2019   Lab Results  Component Value Date   TRIG 98.0 07/12/2019   Lab Results  Component Value Date   CHOLHDL 4 07/12/2019   Lab Results  Component Value Date   HGBA1C 5.5 06/07/2016       Assessment & Plan:   Problem List Items Addressed This Visit        Unprioritized   History of vertebral compression fracture    Check xray       Relevant Medications   cyclobenzaprine (FLEXERIL) 10 MG tablet   traMADol (ULTRAM) 50 MG tablet   Other Relevant Orders   DG Lumbar Spine Complete (Completed)   Acute bilateral low back pain with bilateral sciatica - Primary    Muscle relaxer and tramadol Check xray Heat/ ice F/u in 1-2 weeks in no improvement or  if it worsens       Relevant Medications   cyclobenzaprine (FLEXERIL) 10 MG tablet   traMADol (ULTRAM) 50 MG tablet   Other Relevant Orders   DG Lumbar Spine Complete (Completed)     Meds ordered this encounter  Medications   cyclobenzaprine (FLEXERIL) 10 MG tablet    Sig: Take 1 tablet (10 mg total) by mouth 3 (three) times daily as needed for muscle spasms.    Dispense:  30 tablet    Refill:  0   traMADol (ULTRAM) 50 MG tablet    Sig: Take 1 tablet (50 mg total) by mouth every 8 (eight) hours as needed for up to 5 days.    Dispense:  15 tablet    Refill:  0    I, Daisy Held, DO, personally preformed the services described in this documentation.  All medical record entries made by the scribe were at my direction and in my presence.  I have reviewed the chart and discharge instructions (if applicable) and agree that the record reflects my personal performance and is accurate and complete. 04/25/2022   I,[name],acting as a scribe for Daisy Held, DO.,have documented all relevant documentation on the behalf of Daisy Held, DO,as directed by  Daisy Held, DO while in the presence of Daisy Held, DO.   Daisy Held, DO

## 2022-04-25 NOTE — Patient Instructions (Signed)

## 2022-04-25 NOTE — Assessment & Plan Note (Signed)
Muscle relaxer and tramadol Check xray Heat/ ice F/u in 1-2 weeks in no improvement or if it worsens

## 2022-04-26 ENCOUNTER — Encounter: Payer: Self-pay | Admitting: Family Medicine

## 2022-05-08 ENCOUNTER — Other Ambulatory Visit (HOSPITAL_BASED_OUTPATIENT_CLINIC_OR_DEPARTMENT_OTHER): Payer: Self-pay

## 2022-05-08 ENCOUNTER — Ambulatory Visit (INDEPENDENT_AMBULATORY_CARE_PROVIDER_SITE_OTHER): Payer: Medicare Other

## 2022-05-08 ENCOUNTER — Other Ambulatory Visit: Payer: Self-pay | Admitting: Family Medicine

## 2022-05-08 ENCOUNTER — Encounter: Payer: Self-pay | Admitting: Family Medicine

## 2022-05-08 DIAGNOSIS — Z950 Presence of cardiac pacemaker: Secondary | ICD-10-CM | POA: Diagnosis not present

## 2022-05-08 DIAGNOSIS — I442 Atrioventricular block, complete: Secondary | ICD-10-CM | POA: Diagnosis not present

## 2022-05-08 DIAGNOSIS — Z8781 Personal history of (healed) traumatic fracture: Secondary | ICD-10-CM

## 2022-05-08 DIAGNOSIS — M5441 Lumbago with sciatica, right side: Secondary | ICD-10-CM

## 2022-05-08 MED ORDER — TRAMADOL HCL 50 MG PO TABS
50.0000 mg | ORAL_TABLET | Freq: Three times a day (TID) | ORAL | 0 refills | Status: AC | PRN
  Filled 2022-05-08: qty 15, 5d supply, fill #0

## 2022-05-08 MED ORDER — CYCLOBENZAPRINE HCL 10 MG PO TABS
10.0000 mg | ORAL_TABLET | Freq: Three times a day (TID) | ORAL | 0 refills | Status: DC | PRN
  Filled 2022-05-08: qty 30, 10d supply, fill #0

## 2022-05-08 NOTE — Telephone Encounter (Signed)
Looks like tramadol and flexeril were given at last OV. Okay to refill?

## 2022-05-09 ENCOUNTER — Other Ambulatory Visit (HOSPITAL_BASED_OUTPATIENT_CLINIC_OR_DEPARTMENT_OTHER): Payer: Self-pay

## 2022-05-11 LAB — CUP PACEART REMOTE DEVICE CHECK
Battery Impedance: 2717 Ohm
Battery Remaining Longevity: 23 mo
Battery Voltage: 2.71 V
Brady Statistic AP VP Percent: 4 %
Brady Statistic AP VS Percent: 0 %
Brady Statistic AS VP Percent: 96 %
Brady Statistic AS VS Percent: 0 %
Date Time Interrogation Session: 20231109155423
Implantable Lead Connection Status: 753985
Implantable Lead Connection Status: 753985
Implantable Lead Implant Date: 20041110
Implantable Lead Implant Date: 20041110
Implantable Lead Location: 753859
Implantable Lead Location: 753860
Implantable Pulse Generator Implant Date: 20130318
Lead Channel Impedance Value: 428 Ohm
Lead Channel Impedance Value: 540 Ohm
Lead Channel Pacing Threshold Amplitude: 0.625 V
Lead Channel Pacing Threshold Amplitude: 0.75 V
Lead Channel Pacing Threshold Pulse Width: 0.4 ms
Lead Channel Pacing Threshold Pulse Width: 0.4 ms
Lead Channel Setting Pacing Amplitude: 2 V
Lead Channel Setting Pacing Amplitude: 2.5 V
Lead Channel Setting Pacing Pulse Width: 0.4 ms
Lead Channel Setting Sensing Sensitivity: 4 mV
Zone Setting Status: 755011
Zone Setting Status: 755011

## 2022-05-20 ENCOUNTER — Encounter: Payer: Self-pay | Admitting: Family Medicine

## 2022-05-27 NOTE — Progress Notes (Signed)
Remote pacemaker transmission.   

## 2022-06-09 ENCOUNTER — Other Ambulatory Visit: Payer: Self-pay | Admitting: Family Medicine

## 2022-06-09 DIAGNOSIS — Z889 Allergy status to unspecified drugs, medicaments and biological substances status: Secondary | ICD-10-CM

## 2022-06-10 ENCOUNTER — Other Ambulatory Visit (HOSPITAL_BASED_OUTPATIENT_CLINIC_OR_DEPARTMENT_OTHER): Payer: Self-pay

## 2022-06-10 MED ORDER — LORATADINE 10 MG PO TABS
10.0000 mg | ORAL_TABLET | Freq: Every day | ORAL | 0 refills | Status: DC | PRN
  Filled 2022-06-10: qty 100, 100d supply, fill #0

## 2022-06-13 ENCOUNTER — Other Ambulatory Visit (HOSPITAL_BASED_OUTPATIENT_CLINIC_OR_DEPARTMENT_OTHER): Payer: Self-pay

## 2022-06-18 ENCOUNTER — Other Ambulatory Visit (HOSPITAL_BASED_OUTPATIENT_CLINIC_OR_DEPARTMENT_OTHER): Payer: Self-pay

## 2022-06-25 NOTE — Telephone Encounter (Signed)
Prolia VOB initiated via [email or web address]  Last Prolia inj 01/09/22 Next Prolia inj due 07/13/22

## 2022-07-07 ENCOUNTER — Other Ambulatory Visit: Payer: Self-pay | Admitting: Family Medicine

## 2022-07-07 DIAGNOSIS — I10 Essential (primary) hypertension: Secondary | ICD-10-CM

## 2022-07-08 ENCOUNTER — Other Ambulatory Visit (HOSPITAL_BASED_OUTPATIENT_CLINIC_OR_DEPARTMENT_OTHER): Payer: Self-pay

## 2022-07-08 MED ORDER — LISINOPRIL 40 MG PO TABS
40.0000 mg | ORAL_TABLET | Freq: Every day | ORAL | 1 refills | Status: DC
  Filled 2022-07-08: qty 90, 90d supply, fill #0
  Filled 2022-10-07: qty 90, 90d supply, fill #1

## 2022-07-10 ENCOUNTER — Telehealth: Payer: Self-pay

## 2022-07-10 NOTE — Telephone Encounter (Addendum)
**Note De-Identified Jonah Nestle Obfuscation** Letter received from [facility] requesting a RX for the pts Eliquis.  I have completed the providers page of a BMSPA[employer] application which includes the Eliquis 5 mg RX for # 180 with 3 refills and have e-mailed to the nurse working with our DOD, Dr Ali Lowe today so she can obtain his signature (in Dr Olin Pia absence), date it, and to fax to [employer] at the fax number written on the cover letter included.

## 2022-07-10 NOTE — Telephone Encounter (Signed)
Received form. Dr. Ali Lowe signed form. Faxed to [employer]MS[employer]. Received confirmation that fax was sent.

## 2022-07-16 ENCOUNTER — Other Ambulatory Visit: Payer: Self-pay | Admitting: Family Medicine

## 2022-07-16 DIAGNOSIS — E039 Hypothyroidism, unspecified: Secondary | ICD-10-CM

## 2022-07-18 NOTE — Telephone Encounter (Signed)
Pt ready for scheduling on or after 07/13/22  Out-of-pocket cost due at time of visit: $540 (Medicare and Medicare Supp deductibles)  Primary: Medicare Prolia co-insurance: 20% (approximately $276) Admin fee co-insurance: 20% (approximately $25)  Deductible: $0 of $240  Secondary: [place] [employer] Medicare Supp Prolia co-insurance: will consider Medicare co-insurance once $300 deductible has been met Admin fee co-insurance: will consider Medicare co-insurance once $300 deductible has been met  Deductible: $0 of $300  Prior Auth: NOT required PA# Valid:   ** This summary of benefits is an estimation of the patient's out-of-pocket cost. Exact cost may vary based on individual plan coverage.

## 2022-07-25 NOTE — Telephone Encounter (Signed)
Message left on cell phone voice mail to call office to schedule next Prolia injection.

## 2022-07-29 NOTE — Telephone Encounter (Signed)
VOB resubmitted to see where pt is with deductible now.

## 2022-08-02 NOTE — Telephone Encounter (Signed)
As of 08/01/22  Primary - Medicare: COVERAGE DETAILS: Prolia and administration will be subject to a $240.00 deductible ($0.00 met) and 20.0% coinsurance. The benefits provided on this Verification of Benefits form are Medical Benefits and are the patient's In-Network benefits for Prolia. If you would like Pharmacy Benefits for Prolia, please call [phone].We have provided in network benefits only.  Secondary - [place] [employer] [place] COVERAGE DETAILS: For the secondary MD Purchase option, the plan will coordinate benefits, subject to a $300 deductible ($0 met). Once the deductible has been met, the plan will consider 100% of remaining expenses.

## 2022-08-04 ENCOUNTER — Other Ambulatory Visit: Payer: Self-pay | Admitting: Family Medicine

## 2022-08-05 ENCOUNTER — Other Ambulatory Visit (HOSPITAL_BASED_OUTPATIENT_CLINIC_OR_DEPARTMENT_OTHER): Payer: Self-pay

## 2022-08-05 MED ORDER — LEVOCETIRIZINE DIHYDROCHLORIDE 5 MG PO TABS
5.0000 mg | ORAL_TABLET | Freq: Every evening | ORAL | 3 refills | Status: DC
  Filled 2022-08-05: qty 90, 90d supply, fill #0
  Filled 2022-11-03: qty 90, 90d supply, fill #1

## 2022-08-07 ENCOUNTER — Ambulatory Visit (INDEPENDENT_AMBULATORY_CARE_PROVIDER_SITE_OTHER): Payer: Medicare Other

## 2022-08-07 DIAGNOSIS — I442 Atrioventricular block, complete: Secondary | ICD-10-CM

## 2022-08-08 LAB — CUP PACEART REMOTE DEVICE CHECK
Battery Impedance: 3026 Ohm
Battery Remaining Longevity: 19 mo
Battery Voltage: 2.71 V
Brady Statistic AP VP Percent: 3 %
Brady Statistic AP VS Percent: 0 %
Brady Statistic AS VP Percent: 97 %
Brady Statistic AS VS Percent: 0 %
Date Time Interrogation Session: 20240207171406
Implantable Lead Connection Status: 753985
Implantable Lead Connection Status: 753985
Implantable Lead Implant Date: 20041110
Implantable Lead Implant Date: 20041110
Implantable Lead Location: 753859
Implantable Lead Location: 753860
Implantable Pulse Generator Implant Date: 20130318
Lead Channel Impedance Value: 397 Ohm
Lead Channel Impedance Value: 498 Ohm
Lead Channel Pacing Threshold Amplitude: 0.625 V
Lead Channel Pacing Threshold Amplitude: 0.75 V
Lead Channel Pacing Threshold Pulse Width: 0.4 ms
Lead Channel Pacing Threshold Pulse Width: 0.4 ms
Lead Channel Setting Pacing Amplitude: 2 V
Lead Channel Setting Pacing Amplitude: 2.5 V
Lead Channel Setting Pacing Pulse Width: 0.4 ms
Lead Channel Setting Sensing Sensitivity: 4 mV
Zone Setting Status: 755011
Zone Setting Status: 755011

## 2022-08-11 NOTE — Telephone Encounter (Signed)
The OOP cost for Prolia will be approx $494.37, this reflects the $240 Medicare deductible, 20% co-insurance and secondary deductible. You can always advise pt of estimated OOP cost, collect the Medicare deductible ($240) up front and have pt sign ABN for the remaining cost.

## 2022-08-13 ENCOUNTER — Other Ambulatory Visit: Payer: Self-pay

## 2022-08-13 ENCOUNTER — Other Ambulatory Visit (HOSPITAL_BASED_OUTPATIENT_CLINIC_OR_DEPARTMENT_OTHER): Payer: Self-pay

## 2022-08-20 NOTE — Telephone Encounter (Signed)
**Note De-Identified [name] Obfuscation** Letter received from [facility] [name] fax stating that they have approved the pt for Eliquis assistance until 07/01/2023. [id number]  The letter states that they have notified the pt of tis approval as well.

## 2022-09-03 NOTE — Progress Notes (Signed)
Remote pacemaker transmission.   

## 2022-09-11 ENCOUNTER — Other Ambulatory Visit (HOSPITAL_BASED_OUTPATIENT_CLINIC_OR_DEPARTMENT_OTHER): Payer: Self-pay

## 2022-09-26 ENCOUNTER — Other Ambulatory Visit: Payer: Self-pay | Admitting: Family Medicine

## 2022-09-26 ENCOUNTER — Other Ambulatory Visit (HOSPITAL_BASED_OUTPATIENT_CLINIC_OR_DEPARTMENT_OTHER): Payer: Self-pay

## 2022-09-26 DIAGNOSIS — Z889 Allergy status to unspecified drugs, medicaments and biological substances status: Secondary | ICD-10-CM

## 2022-09-26 MED ORDER — LORATADINE 10 MG PO TABS
10.0000 mg | ORAL_TABLET | Freq: Every day | ORAL | 0 refills | Status: DC | PRN
  Filled 2022-09-26: qty 100, 100d supply, fill #0

## 2022-09-28 ENCOUNTER — Other Ambulatory Visit: Payer: Self-pay | Admitting: Internal Medicine

## 2022-09-30 ENCOUNTER — Other Ambulatory Visit (HOSPITAL_BASED_OUTPATIENT_CLINIC_OR_DEPARTMENT_OTHER): Payer: Self-pay

## 2022-09-30 MED ORDER — HYDROCHLOROTHIAZIDE 12.5 MG PO CAPS
12.5000 mg | ORAL_CAPSULE | Freq: Every day | ORAL | 0 refills | Status: DC
  Filled 2022-09-30: qty 30, 30d supply, fill #0

## 2022-10-04 ENCOUNTER — Other Ambulatory Visit (HOSPITAL_BASED_OUTPATIENT_CLINIC_OR_DEPARTMENT_OTHER): Payer: Self-pay

## 2022-10-07 ENCOUNTER — Other Ambulatory Visit: Payer: Self-pay | Admitting: Family Medicine

## 2022-10-07 DIAGNOSIS — E039 Hypothyroidism, unspecified: Secondary | ICD-10-CM

## 2022-10-08 ENCOUNTER — Other Ambulatory Visit (HOSPITAL_BASED_OUTPATIENT_CLINIC_OR_DEPARTMENT_OTHER): Payer: Self-pay

## 2022-10-14 ENCOUNTER — Other Ambulatory Visit (HOSPITAL_BASED_OUTPATIENT_CLINIC_OR_DEPARTMENT_OTHER): Payer: Self-pay

## 2022-10-14 ENCOUNTER — Telehealth: Payer: Self-pay | Admitting: Internal Medicine

## 2022-10-14 MED ORDER — HYDROCHLOROTHIAZIDE 12.5 MG PO CAPS: 12.5000 mg | ORAL_CAPSULE | Freq: Every day | ORAL | 0 refills | Status: DC

## 2022-10-14 MED ORDER — HYDROCHLOROTHIAZIDE 12.5 MG PO CAPS
12.5000 mg | ORAL_CAPSULE | Freq: Every day | ORAL | 0 refills | Status: DC
  Filled 2022-10-14: qty 90, 90d supply, fill #0

## 2022-10-14 NOTE — Addendum Note (Signed)
Addended by: Margaret Pyle D on: 10/14/2022 02:54 PM   Modules accepted: Orders

## 2022-10-14 NOTE — Telephone Encounter (Signed)
*  STAT* If patient is at the pharmacy, call can be transferred to refill team.   1. Which medications need to be refilled? (please list name of each medication and dose if known)   hydrochlorothiazide (MICROZIDE) 12.5 MG capsule   2. Which pharmacy/location (including street and city if local pharmacy) is medication to be sent to?  [facility] - [place], [place] - [address]   3. Do they need a 30 day or 90 day supply?   90 day  Patient stated she still has some of this medication.  Patient has appointment scheduled on 7/11.

## 2022-10-14 NOTE — Telephone Encounter (Signed)
Pt's medication was sent to pt's pharmacy as requested. Confirmation received.  °

## 2022-10-17 ENCOUNTER — Encounter: Payer: Self-pay | Admitting: Family Medicine

## 2022-10-17 ENCOUNTER — Ambulatory Visit (INDEPENDENT_AMBULATORY_CARE_PROVIDER_SITE_OTHER): Payer: Medicare Other | Admitting: Family Medicine

## 2022-10-17 ENCOUNTER — Ambulatory Visit (HOSPITAL_BASED_OUTPATIENT_CLINIC_OR_DEPARTMENT_OTHER)
Admission: RE | Admit: 2022-10-17 | Discharge: 2022-10-17 | Disposition: A | Discharge status: Home / Self Care | Payer: Medicare Other | Source: Ambulatory Visit | Attending: Family Medicine | Admitting: Family Medicine

## 2022-10-17 ENCOUNTER — Other Ambulatory Visit (HOSPITAL_BASED_OUTPATIENT_CLINIC_OR_DEPARTMENT_OTHER): Payer: Self-pay

## 2022-10-17 VITALS — BP 152/84 | HR 75 | Ht 63.0 in | Wt 154.4 lb

## 2022-10-17 DIAGNOSIS — M542 Cervicalgia: Secondary | ICD-10-CM | POA: Insufficient documentation

## 2022-10-17 DIAGNOSIS — D1722 Benign lipomatous neoplasm of skin and subcutaneous tissue of left arm: Secondary | ICD-10-CM

## 2022-10-17 MED ORDER — CYCLOBENZAPRINE HCL 5 MG PO TABS
5.0000 mg | ORAL_TABLET | Freq: Three times a day (TID) | ORAL | 1 refills | Status: DC | PRN
  Filled 2022-10-17: qty 30, 10d supply, fill #0

## 2022-10-17 NOTE — Patient Instructions (Signed)
Lipoma  A lipoma is a noncancerous (benign) tumor that is made up of fat cells. This is a very common type of soft-tissue growth. Lipomas are usually found under the skin (subcutaneous). They may occur in any tissue of the body that contains fat. Common areas for lipomas to appear include the back, arms, shoulders, buttocks, and thighs. Lipomas grow slowly, and they are usually painless. Most lipomas do not cause problems and do not require treatment. What are the causes? The cause of this condition is not known. What increases the risk? You are more likely to develop this condition if: You are 40-60 years old. You have a family history of lipomas. What are the signs or symptoms? A lipoma usually appears as a small, round bump under the skin. In most cases, the lump will: Feel soft or rubbery. Not cause pain or other symptoms. However, if a lipoma is located in an area where it pushes on nerves, it can become painful or cause other symptoms. How is this diagnosed? A lipoma can usually be diagnosed with a physical exam. You may also have tests to confirm the diagnosis and to rule out other conditions. Tests may include: Imaging tests, such as a CT scan or an MRI. Removal of a tissue sample to be looked at under a microscope (biopsy). How is this treated? Treatment for this condition depends on the size of the lipoma and whether it is causing any symptoms. For small lipomas that are not causing problems, no treatment is needed. If a lipoma is bigger or it causes problems, surgery may be done to remove the lipoma. Lipomas can also be removed to improve appearance. Most often, the procedure is done after applying a medicine that numbs the area (local anesthetic). Liposuction may be done to reduce the size of the lipoma before it is removed through surgery, or it may be done to remove the lipoma. Lipomas are removed with this method to limit incision size and scarring. A liposuction tube is  inserted through a small incision into the lipoma, and the contents of the lipoma are removed through the tube with suction. Follow these instructions at home: Watch your lipoma for any changes. Keep all follow-up visits. This is important. Where to find more information OrthoInfo: orthoinfo.aaos.org Contact a health care provider if: Your lipoma becomes larger or hard. Your lipoma becomes painful, red, or increasingly swollen. These could be signs of infection or a more serious condition. Get help right away if: You develop tingling or numbness in an area near the lipoma. This could indicate that the lipoma is causing nerve damage. Summary A lipoma is a noncancerous tumor that is made up of fat cells. Most lipomas do not cause problems and do not require treatment. If a lipoma is bigger or it causes problems, surgery may be done to remove the lipoma. Contact a health care provider if your lipoma becomes larger or hard, or if it becomes painful, red, or increasingly swollen. These could be signs of infection or a more serious condition. This information is not intended to replace advice given to you by your health care provider. Make sure you discuss any questions you have with your health care provider. Document Revised: 07/06/2021 Document Reviewed: 07/06/2021 Elsevier Patient Education  2023 Elsevier Inc.  

## 2022-10-17 NOTE — Assessment & Plan Note (Signed)
Recently getting bigger Refer to surgery

## 2022-10-17 NOTE — Progress Notes (Signed)
Subjective:   By signing my name below, I, [name], attest that this documentation has been prepared under the direction and in the presence of Donato Schultz, DO. 10/17/2022   Patient ID: [name], female    DOB: [birth year], 82 y.o.   MRN: [id number]  Chief Complaint  Patient presents with   Shoulder Pain    Left underarm pain Onset one month after flu like virus  Denies chest pain     Shoulder Pain  Pertinent negatives include no fever.   Patient is in today for a office visit.   She complains of left shoulder pain, left arm pain, and neck pain for the past month. Her arm pain starts from her left triceps and radiates to her hand. It is tender to touch and not swollen. She has a lipoma on her left arm.  She continues hearing occasional popping noises when turning her head. It is not causing her pain.   Past Medical History:  Diagnosis Date   Atrioventricular block, complete    a. 08/2011 Upgrade of PPM to MDT Adapta L Dual Chamber PPM ser # [id number].   Cardiac pacemaker in situ    Contact dermatitis and eczema    unspec cause   Dairy product intolerance    Depression    Diastolic dysfunction    GERD (gastroesophageal reflux disease)    Hiatal hernia    History of diverticulitis of colon    2003  perforated diverticulitis w/ surgical intervention   History of kidney stones    age 33   History of peripheral edema    lower extremities   History of thyroid nodule    multinodular goiter  s/p  total thyroidectomy 2014   HTN (hypertension)    Hydronephrosis, left    Hypothyroidism    Mild sleep apnea    per study 04-14-2014   Nocturia    NSVT (nonsustained ventricular tachycardia)    Osteoporosis    PAF (paroxysmal atrial fibrillation)    PONV (postoperative nausea and vomiting)    Pulmonary nodules    stable per last ct   Renal cell carcinoma of left kidney    Superficial thrombophlebitis     Past Surgical History:  Procedure Laterality Date    CARDIOVASCULAR STRESS TEST  04-20-2015  dr Graciela Husbands   normal nuclear study/  no ischemia/  normal LV function and wall motion, ef 74%   COLONOSCOPY  01/19/2013   COLOSTOMY TAKEDOWN  07-22-2002   CYSTOSCOPY WITH RETROGRADE PYELOGRAM, URETEROSCOPY AND STENT PLACEMENT Left 07/28/2015   Procedure: CYSTOSCOPY WITH LEFT RETROGRADE PYELOGRAM, POSSIBLE LEFT URETEROSCOPY AND STENT PLACEMENT;  Surgeon: Marcine Matar, MD;  Location: [facility];  Service: Urology;  Laterality: Left;   EXPLORATORY LAPARTOMY SIGMOID COLECTOMY/ COLOSTOMY  04-28-2002   perforated diverticulitis   KYPHOPLASTY     LAPAROSCOPY VENTRAL HERNIA REPAIR/  EXTENSIVE LYSIS ADHESIONS  05-04-2004   PACEMAKER GENERATOR CHANGE Left 09/16/2011   MDT ADDRL1 pacemaker   PACEMAKER PLACEMENT  05-11-2003     medtronic   ROBOT ASSISTED LAPAROSCOPIC NEPHRECTOMY Left 03/20/2016   Procedure: XI ROBOTIC ASSISTED LAPAROSCOPIC RETROPERITONEAL NEPHRECTOMY;  Surgeon: Sebastian Ache, MD;  Location: [facility];  Service: Urology;  Laterality: Left;   THYROIDECTOMY N/A 08/07/2012   Procedure: THYROIDECTOMY;  Surgeon: Velora Heckler, MD;  Location: [facility];  Service: General;  Laterality: N/A;   TRANSTHORACIC ECHOCARDIOGRAM  10-12-2014   moderate focal basal LVH,  ef 55-60%,  grade 2 diastolic dysfunction,  mild AV calcification without stenosis,  mild MR,  trivial PR    Family History  Problem Relation Age of Onset   Heart disease Mother        Stent in her 41s   Stroke Mother    Hypertension Mother    Heart failure Father    Diverticulosis Sister    Hypertension Brother    Heart disease Brother    Hypertension Brother    Heart attack Neg Hx     Social History   Socioeconomic History   Marital status: Single    Spouse name: Not on file   Number of children: 2   Years of education: 16   Highest education level: Not on file  Occupational History   Occupation: Retired Nurse, adult for the [employer]    Employer:  RETIRED  Tobacco Use   Smoking status: Former    Years: 15    Types: Cigarettes    Quit date: 10/06/1979    Years since quitting: 43.0   Smokeless tobacco: Never  Vaping Use   Vaping Use: Never used  Substance and Sexual Activity   Alcohol use: No   Drug use: No   Sexual activity: Not on file  Other Topics Concern   Not on file  Social History Narrative   The patient is a Buyer, retail of [employer].  She worked for the [employer] in [employer].  She was married for 22 years, divorced, and has remained single. She has 1 grown son, 1 grown daughter.  She has 3 grandchildren, 2 of her children   live in [place].  She is retired.  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 She had a long term companion, 32 years, who passed away Nov 01, 2010.   Social Determinants of Health   Financial Resource Strain: Low Risk  (03/21/2021)   Overall Financial Resource Strain (CARDIA)    Difficulty of Paying Living Expenses: Not hard at all  Food Insecurity: No Food Insecurity (03/08/2022)   Hunger Vital Sign    Worried About Running Out of Food in the Last Year: Never true    Ran Out of Food in the Last Year: Never true  Transportation Needs: No Transportation Needs (03/08/2022)   PRAPARE - Administrator, Civil Service (Medical): No    Lack of Transportation (Non-Medical): No  Physical Activity: Insufficiently Active (03/21/2021)   Exercise Vital Sign    Days of Exercise per Week: 4 days    Minutes of Exercise per Session: 30 min  Stress: No Stress Concern Present (03/21/2021)   [employer] of Occupational Health - Occupational Stress Questionnaire    Feeling of Stress : Not at all  Social Connections: Socially Isolated (03/21/2021)   Social Connection and Isolation Panel [NHANES]    Frequency of Communication with Friends and Family: More than three times a week    Frequency of Social Gatherings with Friends and Family: More than three times a  week    Attends Religious Services: Never    Database administrator or Organizations: No    Attends Banker Meetings: Never    Marital Status: Divorced  Catering manager Violence: Not At Risk (03/21/2021)   Humiliation, Afraid, Rape, and Kick questionnaire    Fear of Current or Ex-Partner: No    Emotionally Abused: No    Physically Abused: No    Sexually Abused: No    Outpatient Medications  Prior to Visit  Medication Sig Dispense Refill   acetaminophen (TYLENOL) 500 MG tablet Take 1,000 mg by mouth daily as needed for moderate pain.     amLODipine (NORVASC) 10 MG tablet Take 1 tablet by mouth once daily 90 tablet 1   apixaban (ELIQUIS) 5 MG TABS tablet Take 1 tablet (5 mg total) by mouth 2 (two) times daily. 180 tablet 1   cyclobenzaprine (FLEXERIL) 10 MG tablet Take 1 tablet (10 mg total) by mouth 3 (three) times daily as needed for muscle spasms. 30 tablet 0   denosumab (PROLIA) 60 MG/ML SOSY injection Inject 60 mg into the skin every 6 (six) months.     diphenhydramine-acetaminophen (TYLENOL PM) 25-500 MG TABS tablet Take 2 tablets by mouth at bedtime as needed (sleep/pain).     fluticasone (FLONASE) 50 MCG/ACT nasal spray Place 2 sprays into both nostrils daily as needed for allergies or rhinitis. 16 g 5   hydrochlorothiazide (MICROZIDE) 12.5 MG capsule Take 1 capsule (12.5 mg total) by mouth daily. 90 capsule 0   levocetirizine (XYZAL) 5 MG tablet Take 1 tablet (5 mg total) by mouth every evening. 90 tablet 3   levothyroxine (SYNTHROID) 100 MCG tablet TAKE 1 TABLET BY MOUTH ONCE DAILY . APPOINTMENT REQUIRED FOR FUTURE REFILLS 90 tablet 0   lisinopril (ZESTRIL) 40 MG tablet Take 1 tablet (40 mg total) by mouth daily. 90 tablet 1   loratadine (ALLERGY RELIEF, LORATADINE,) 10 MG tablet Take 1 tablet (10 mg total) by mouth daily as needed for allergies or rhinitis. 100 tablet 0   azelastine (ASTELIN) 0.1 % nasal spray Place 1 spray into both nostrils 2 (two) times daily. Use  in each nostril as directed (Patient not taking: Reported on 10/17/2022) 30 mL 12   Facility-Administered Medications Prior to Visit  Medication Dose Route Frequency Provider Last Rate Last Admin   aminophylline injection 150 mg  150 mg Intravenous BID PRN Lars Masson, MD   150 mg at 04/20/15 1200    Allergies  Allergen Reactions   Tape Rash   Codeine Nausea And Vomiting    Review of Systems  Constitutional:  Negative for fever and malaise/fatigue.  HENT:  Negative for congestion.   Eyes:  Negative for blurred vision.  Respiratory:  Negative for shortness of breath.   Cardiovascular:  Negative for chest pain, palpitations and leg swelling.  Gastrointestinal:  Negative for abdominal pain, blood in stool and nausea.  Genitourinary:  Negative for dysuria and frequency.  Musculoskeletal:  Positive for neck pain. Negative for falls.       (+)left arm pain (+)left shoulder pain (-)left arm swelling (+)popping noises when turning head  Skin:  Negative for rash.  Neurological:  Negative for dizziness, loss of consciousness and headaches.  Endo/Heme/Allergies:  Negative for environmental allergies.  Psychiatric/Behavioral:  Negative for depression. The patient is not nervous/anxious.        Objective:    Physical Exam Vitals and nursing note reviewed.  Constitutional:      General: She is not in acute distress.    Appearance: Normal appearance. She is not ill-appearing.  HENT:     Head: Normocephalic and atraumatic.     Right Ear: External ear normal.     Left Ear: External ear normal.  Eyes:     Extraocular Movements: Extraocular movements intact.     Pupils: Pupils are equal, round, and reactive to light.  Cardiovascular:     Rate and Rhythm: Normal rate and regular rhythm.  Heart sounds: Normal heart sounds. No murmur heard.    No gallop.  Pulmonary:     Effort: Pulmonary effort is normal. No respiratory distress.     Breath sounds: Normal breath sounds. No  wheezing or rales.  Musculoskeletal:     Comments: Large lipoma ---- recently grew even bigger tender to touch' Pain radiates to hand/ fingers on L upper arm   Skin:    General: Skin is warm and dry.     Comments: Rubbery lipoma on left arm, has grown since last visit.   Neurological:     General: No focal deficit present.     Mental Status: She is alert and oriented to person, place, and time.  Psychiatric:        Judgment: Judgment normal.     BP (!) 152/84 (BP Location: Right Arm, Patient Position: Sitting, Cuff Size: Normal)   Pulse 75   Ht  (1.6 m)   Wt 154 lb 6.4 oz (70 kg)   SpO2 98%   BMI 27.35 kg/m  Wt Readings from Last 3 Encounters:  10/17/22 154 lb 6.4 oz (70 kg)  04/25/22 151 lb 9.6 oz (68.8 kg)  04/11/22 152 lb 6.4 oz (69.1 kg)       Assessment & Plan:  Lipoma of left upper extremity Assessment & Plan: Recently getting bigger Refer to surgery   Orders: -     Ambulatory referral to General Surgery  Neck pain -     DG Cervical Spine Complete; Future -     Cyclobenzaprine HCl; Take 1 tablet (5 mg total) by mouth 3 (three) times daily as needed for muscle spasms.  Dispense: 30 tablet; Refill: 1    I, Donato Schultz, DO, personally preformed the services described in this documentation.  All medical record entries made by the scribe were at my direction and in my presence.  I have reviewed the chart and discharge instructions (if applicable) and agree that the record reflects my personal performance and is accurate and complete. 10/17/2022   I,[name],acting as a scribe for Donato Schultz, DO.,have documented all relevant documentation on the behalf of Donato Schultz, DO,as directed by  Donato Schultz, DO while in the presence of Donato Schultz, DO.   Donato Schultz, DO

## 2022-10-25 ENCOUNTER — Encounter: Payer: Self-pay | Admitting: Physician Assistant

## 2022-10-25 ENCOUNTER — Ambulatory Visit: Payer: Medicare Other | Attending: Internal Medicine | Admitting: Physician Assistant

## 2022-10-25 ENCOUNTER — Other Ambulatory Visit (HOSPITAL_BASED_OUTPATIENT_CLINIC_OR_DEPARTMENT_OTHER): Payer: Self-pay

## 2022-10-25 VITALS — BP 118/70 | HR 80 | Ht 63.0 in | Wt 154.4 lb

## 2022-10-25 DIAGNOSIS — Z950 Presence of cardiac pacemaker: Secondary | ICD-10-CM | POA: Diagnosis present

## 2022-10-25 DIAGNOSIS — I442 Atrioventricular block, complete: Secondary | ICD-10-CM | POA: Diagnosis present

## 2022-10-25 DIAGNOSIS — I10 Essential (primary) hypertension: Secondary | ICD-10-CM | POA: Diagnosis present

## 2022-10-25 DIAGNOSIS — D6869 Other thrombophilia: Secondary | ICD-10-CM | POA: Insufficient documentation

## 2022-10-25 DIAGNOSIS — I480 Paroxysmal atrial fibrillation: Secondary | ICD-10-CM | POA: Diagnosis not present

## 2022-10-25 LAB — CUP PACEART INCLINIC DEVICE CHECK
Battery Impedance: 3247 Ohm
Battery Remaining Longevity: 17 mo
Battery Voltage: 2.7 V
Brady Statistic AP VP Percent: 3 %
Brady Statistic AP VS Percent: 0 %
Brady Statistic AS VP Percent: 97 %
Brady Statistic AS VS Percent: 0 %
Date Time Interrogation Session: 20240426171725
Implantable Lead Connection Status: 753985
Implantable Lead Connection Status: 753985
Implantable Lead Implant Date: 20041110
Implantable Lead Implant Date: 20041110
Implantable Lead Location: 753859
Implantable Lead Location: 753860
Implantable Pulse Generator Implant Date: 20130318
Lead Channel Impedance Value: 397 Ohm
Lead Channel Impedance Value: 510 Ohm
Lead Channel Pacing Threshold Amplitude: 0.5 V
Lead Channel Pacing Threshold Amplitude: 0.625 V
Lead Channel Pacing Threshold Amplitude: 0.625 V
Lead Channel Pacing Threshold Amplitude: 0.75 V
Lead Channel Pacing Threshold Pulse Width: 0.4 ms
Lead Channel Pacing Threshold Pulse Width: 0.4 ms
Lead Channel Pacing Threshold Pulse Width: 0.4 ms
Lead Channel Pacing Threshold Pulse Width: 0.4 ms
Lead Channel Sensing Intrinsic Amplitude: 4 mV
Lead Channel Setting Pacing Amplitude: 2 V
Lead Channel Setting Pacing Amplitude: 2.5 V
Lead Channel Setting Pacing Pulse Width: 0.4 ms
Lead Channel Setting Sensing Sensitivity: 4 mV
Zone Setting Status: 755011
Zone Setting Status: 755011

## 2022-10-25 MED ORDER — HYDROCHLOROTHIAZIDE 12.5 MG PO CAPS
12.5000 mg | ORAL_CAPSULE | Freq: Every day | ORAL | 2 refills | Status: DC
  Filled 2022-10-25: qty 90, 90d supply, fill #0

## 2022-10-25 NOTE — Progress Notes (Signed)
Cardiology Office Note Date:  10/25/2022  Patient ID:  Daisy, [name] 01-Mar-1941, MRN [id number] PCP:  Donato Schultz, DO  Cardiologist:  Dr. Graciela Husbands   Chief Complaint: annual visit   History of Present Illness: [name] is a 82 y.o. female with history of CHB w/PPM, HTN, hypothyroid s/p thyroidectomy, renal cancer/nephrectomy (left), AFib, NSVT, CKD (III)  She was hospitalized 09/13/20 for symptoms initially of GI sense with stomach upset and loose stools > CP.  GI coctain in the ER improved her sx. cardiology consulted, felt to have atypical symptoms for cardiac etiology though c/o ongoing L arm aching and planned for ischemic w/u that noted no ischemia, cardiology signed off 09/17/20. LVEF 55-60%, grade II DD, mod TR GI suspected GERD Discharged 09/17/20 Her HCTZ d/c 2/2 AKI/CKD, planned for GI follow up  I saw her 09/21/20 She is feeling much better since home, no ongoing GI issues She has a GI visit scheduled, can not recall when, no PMD appt yet. She denies any cardiac concerns or awareness. No CP, palpitations No SOB, DOE No dizziness, near syncope or syncope No bleeding or signs of bleeding No changes were made, AMS were false for PACs and burden <1% Planned for labs and annual visit  06/06/21, ER visit with c/o CP, SOB w/nonproductive cough, abd c/o, 3 weeks of constipation CT: No acute findings in the chest, abdomen or pelvis. (Full findings noted in Epic) Cxr: small L effusion Device check reported with no abnormal findings K+ 3.7 BUN/Creat 18/0.96 BNP 479 HS Trop 8 WBC 4.3 H/H 12/38 Plts 132 Discharged from the ER  She saw her PMD 07/03/21, no discussion on her symptoms or ER visit  I saw her 07/12/21 She feels well again, no ongoing SOB, , CP or cough No palpitations No dizzy spells, near syncope or syncope. She got no meds in the ER has not changed anything at home, but feels well again She has an echo scheduled for next week She had  generalized aches/pains, malaise leading up to her ER visit was surprised that she tested negative for flu/COVID She is concerned about her BP Low AF burden Amlodipine increased  She saw Dr. Graciela Husbands 10/12/21 BP not controlled, some edema reported, using support stockings, metoprolol stopped, HCTZ resumed  TODAY She is doing quite well Has a new [place] grandson she is excited to meet soon. Stays active, busy, denies any exertional intolerances. No CP, palpitations, cardiac awareness No SOB, DOE No near syncope or syncope Sees her PMD<,has labs there a couple times a year   Device information: MDT dual chamber PPM, implanted 2004, gen change 09/16/11, Dr. Ladona Ridgel, [facility]B Dr. Odessa Fleming note mentions Ventricular failure to pace/polarity switch (notes for years mentions this) Is programmed unipolar DEVICE DEPENDEDNT   Past Medical History:  Diagnosis Date   Atrioventricular block, complete (HCC)    a. 08/2011 Upgrade of PPM to MDT Adapta L Dual Chamber PPM ser # [id number].   Cardiac pacemaker in situ    Contact dermatitis and eczema    unspec cause   Dairy product intolerance    Depression    Diastolic dysfunction    GERD (gastroesophageal reflux disease)    Hiatal hernia    History of diverticulitis of colon    2003  perforated diverticulitis w/ surgical intervention   History of kidney stones    age 65   History of peripheral edema    lower extremities   History of thyroid nodule  multinodular goiter  s/p  total thyroidectomy 2014   HTN (hypertension)    Hydronephrosis, left    Hypothyroidism    Mild sleep apnea    per study 04-14-2014   Nocturia    NSVT (nonsustained ventricular tachycardia) (HCC)    Osteoporosis    PAF (paroxysmal atrial fibrillation) (HCC)    PONV (postoperative nausea and vomiting)    Pulmonary nodules    stable per last ct   Renal cell carcinoma of left kidney (HCC)    Superficial thrombophlebitis     Past Surgical History:  Procedure Laterality  Date   CARDIOVASCULAR STRESS TEST  04-20-2015  dr Graciela Husbands   normal nuclear study/  no ischemia/  normal LV function and wall motion, ef 74%   COLONOSCOPY  01/19/2013   COLOSTOMY TAKEDOWN  07-22-2002   CYSTOSCOPY WITH RETROGRADE PYELOGRAM, URETEROSCOPY AND STENT PLACEMENT Left 07/28/2015   Procedure: CYSTOSCOPY WITH LEFT RETROGRADE PYELOGRAM, POSSIBLE LEFT URETEROSCOPY AND STENT PLACEMENT;  Surgeon: Marcine Matar, MD;  Location[facility];  Service: Urology;  Laterality: Left;   EXPLORATORY LAPARTOMY SIGMOID COLECTOMY/ COLOSTOMY  04-28-2002   perforated diverticulitis   KYPHOPLASTY     LAPAROSCOPY VENTRAL HERNIA REPAIR/  EXTENSIVE LYSIS ADHESIONS  05-04-2004   PACEMAKER GENERATOR CHANGE Left 09/16/2011   MDT ADDRL1 pacemaker   PACEMAKER PLACEMENT  05-11-2003     medtronic   ROBOT ASSISTED LAPAROSCOPIC NEPHRECTOMY Left 03/20/2016   Procedure: XI ROBOTIC ASSISTED LAPAROSCOPIC RETROPERITONEAL NEPHRECTOMY;  Surgeon: Sebastian Ache, MD;  Location: [facility];  Service: Urology;  Laterality: Left;   THYROIDECTOMY N/A 08/07/2012   Procedure: THYROIDECTOMY;  Surgeon: Velora Heckler, MD;  Location: [facility];  Service: General;  Laterality: N/A;   TRANSTHORACIC ECHOCARDIOGRAM  10-12-2014   moderate focal basal LVH,  ef 55-60%,  grade 2 diastolic dysfunction, mild AV calcification without stenosis,  mild MR,  trivial PR    Current Outpatient Medications  Medication Sig Dispense Refill   acetaminophen (TYLENOL) 500 MG tablet Take 1,000 mg by mouth daily as needed for moderate pain.     amLODipine (NORVASC) 10 MG tablet Take 1 tablet by mouth once daily 90 tablet 1   apixaban (ELIQUIS) 5 MG TABS tablet Take 1 tablet (5 mg total) by mouth 2 (two) times daily. 180 tablet 1   cyclobenzaprine (FLEXERIL) 10 MG tablet Take 1 tablet (10 mg total) by mouth 3 (three) times daily as needed for muscle spasms. 30 tablet 0   cyclobenzaprine (FLEXERIL) 5 MG tablet Take 1 tablet (5 mg total) by mouth 3  (three) times daily as needed for muscle spasms. 30 tablet 1   denosumab (PROLIA) 60 MG/ML SOSY injection Inject 60 mg into the skin every 6 (six) months.     diphenhydramine-acetaminophen (TYLENOL PM) 25-500 MG TABS tablet Take 2 tablets by mouth at bedtime as needed (sleep/pain).     fluticasone (FLONASE) 50 MCG/ACT nasal spray Place 2 sprays into both nostrils daily as needed for allergies or rhinitis. 16 g 5   hydrochlorothiazide (MICROZIDE) 12.5 MG capsule Take 1 capsule (12.5 mg total) by mouth daily. 90 capsule 0   levocetirizine (XYZAL) 5 MG tablet Take 1 tablet (5 mg total) by mouth every evening. 90 tablet 3   levothyroxine (SYNTHROID) 100 MCG tablet TAKE 1 TABLET BY MOUTH ONCE DAILY . APPOINTMENT REQUIRED FOR FUTURE REFILLS 90 tablet 0   lisinopril (ZESTRIL) 40 MG tablet Take 1 tablet (40 mg total) by mouth daily. 90 tablet 1   loratadine (ALLERGY  RELIEF, LORATADINE,) 10 MG tablet Take 1 tablet (10 mg total) by mouth daily as needed for allergies or rhinitis. 100 tablet 0   No current facility-administered medications for this visit.   Facility-Administered Medications Ordered in Other Visits  Medication Dose Route Frequency Provider Last Rate Last Admin   aminophylline injection 150 mg  150 mg Intravenous BID PRN Lars Masson, MD   150 mg at 04/20/15 1200    Allergies:   Tape and Codeine   Social History:  The patient  reports that she quit smoking about 43 years ago. Her smoking use included cigarettes. She has never used smokeless tobacco. She reports that she does not drink alcohol and does not use drugs.   Family History:  The patient's family history includes Diverticulosis in her sister; Heart disease in her brother and mother; Heart failure in her father; Hypertension in her brother, brother, and mother; Stroke in her mother.  ROS:  Please see the history of present illness.  All other systems are reviewed and otherwise negative.   PHYSICAL EXAM:  VS:  There were  no vitals taken for this visit. BMI: There is no height or weight on file to calculate BMI. Well nourished, well developed, in no acute distress  HEENT: normocephalic, atraumatic  Neck: no JVD, carotid bruits or masses Cardiac:  RRR; no significant murmurs, no rubs, or gallops Lungs:   CTA b/l, no wheezing, rhonchi or rales  Abd: soft, nontender MS: no deformity or atrophy Ext:  no edema, wearing compression hose Skin: warm and dry, no rash Neuro:  No gross deficits appreciated Psych: euthymic mood, full affect   PPM site is stable, no tethering or discomfort   EKG:  done today and reviewed by myself SR/V paced 80bpm  PPM interrogation done today and reviewed by myself:  Battery and lead measurements are good Dependent Last AF was Dec 2023,  Burden <0.1% HR histogram looks OK    09/16/20: stress myoview IMPRESSION: 1. Matched reduced activity in the inferior wall is somewhat large, but probably from diaphragmatic attenuation rather than scarring given the reasonable wall motion in this vicinity. 2. Mild apical hypokinesis. 3. Left ventricular ejection fraction 65% 4. Non invasive risk stratification*: Low   09/14/2020; TTE IMPRESSIONS   1. Left ventricular ejection fraction, by estimation, is 55 to 60%. The  left ventricle has normal function. The left ventricle has no regional  wall motion abnormalities. There is mild asymmetric left ventricular  hypertrophy. Left ventricular diastolic  parameters are consistent with Grade II diastolic dysfunction  (pseudonormalization).   2. Right ventricular systolic function is normal. The right ventricular  size is normal. There is normal pulmonary artery systolic pressure.   3. The mitral valve is grossly normal. Mild mitral valve regurgitation.   4. Tricuspid valve regurgitation is mild to moderate.   5. The aortic valve is tricuspid. Aortic valve regurgitation is not  visualized.    10/12/14: TTE Study Conclusions - Left  ventricle: The cavity size was normal. There was moderate   focal basal hypertrophy. Systolic function was normal. The   estimated ejection fraction was in the range of 55% to 60%. Wall   motion was normal; there were no regional wall motion   abnormalities. Features are consistent with a pseudonormal left   ventricular filling pattern, with concomitant abnormal relaxation   and increased filling pressure (grade 2 diastolic dysfunction). - Aortic valve: Mildly calcified annulus. Trileaflet. Thickening.   Transvalvular velocity was within the normal range.  There was no   stenosis. There was no regurgitation. - Mitral valve: Transvalvular velocity was within the normal range.   There was no evidence for stenosis. There was mild regurgitation. - Pulmonic valve: There was trivial regurgitation.  10/16 Myoview normal LV function and no ischemia   Recent Labs: 11/22/2021: TSH 1.03  No results found for requested labs within last 365 days.   CrCl cannot be calculated (Patient's most recent lab result is older than the maximum 21 days allowed.).   Wt Readings from Last 3 Encounters:  10/17/22 154 lb 6.4 oz (70 kg)  04/25/22 151 lb 9.6 oz (68.8 kg)  04/11/22 152 lb 6.4 oz (69.1 kg)     Other studies reviewed: Additional studies/records reviewed today include: summarized above  ASSESSMENT AND PLAN:  1. CHB w/PPM     Intact function     no programming changes made  2. New Paroxysmal Afib     CHA2DS2Vasc is 4, on Eliquis, appropriately dosed by last labs     <0.1 % burden  Will request her last labs from the PMD      3. HTN     Looks good   4. Secondary hypercoagulable state     Disposition: remotes as usual, in clinic with EP in a year, sooner if needed   Current medicines are reviewed at length with the patient today.  The patient did not have any concerns regarding medicines.  Judith Blonder, PA-C 10/25/2022 5:58 AM     [facility] [address] [place] [address] [phone] (office)  [phone] (fax)

## 2022-10-25 NOTE — Patient Instructions (Signed)
Medication Instructions:   Your physician recommends that you continue on your current medications as directed. Please refer to the Current Medication list given to you today.   *If you need a refill on your cardiac medications before your next appointment, please call your pharmacy*   Lab Work:  NONE ORDERED  TODAY   If you have labs (blood work) drawn today and your tests are completely normal, you will receive your results only by: MyChart Message (if you have MyChart) OR A paper copy in the mail If you have any lab test that is abnormal or we need to change your treatment, we will call you to review the results.   Testing/Procedures: NONE ORDERED  TODAY     Follow-Up: At [facility], you and your health needs are our priority.  As part of our continuing mission to provide you with exceptional heart care, we have created designated Provider Care Teams.  These Care Teams include your primary Cardiologist (physician) and Advanced Practice Providers (APPs -  Physician Assistants and Nurse Practitioners) who all work together to provide you with the care you need, when you need it.  We recommend signing up for the patient portal called "MyChart".  Sign up information is provided on this After Visit Summary.  MyChart is used to connect with patients for Virtual Visits (Telemedicine).  Patients are able to view lab/test results, encounter notes, upcoming appointments, etc.  Non-urgent messages can be sent to your provider as well.   To learn more about what you can do with MyChart, go to [email or web address].    Your next appointment:   1 year(s)  Provider:   You may see Dr. Graciela Husbands  or one of the following Advanced Practice Providers on your designated Care Team:   Francis Dowse, [place] Casimiro Needle "Mardelle Matte" Lanna Poche, [place]   Other Instructions

## 2022-10-31 ENCOUNTER — Other Ambulatory Visit: Payer: Self-pay | Admitting: Internal Medicine

## 2022-10-31 ENCOUNTER — Encounter: Payer: Self-pay | Admitting: Internal Medicine

## 2022-10-31 ENCOUNTER — Other Ambulatory Visit (HOSPITAL_BASED_OUTPATIENT_CLINIC_OR_DEPARTMENT_OTHER): Payer: Self-pay

## 2022-10-31 MED ORDER — ALENDRONATE SODIUM 70 MG PO TABS
70.0000 mg | ORAL_TABLET | ORAL | 5 refills | Status: DC
  Filled 2022-10-31: qty 4, 28d supply, fill #0

## 2022-11-01 NOTE — Telephone Encounter (Signed)
VOB re-submitted.  

## 2022-11-01 NOTE — Telephone Encounter (Signed)
[name] this patient would like for [place] to resubmit VOB - she advises that as of 05/0/24 her deductible is met. So if we could rerun that and confirm how much if anything would be due at the time of Prolia injection.

## 2022-11-04 ENCOUNTER — Other Ambulatory Visit (HOSPITAL_BASED_OUTPATIENT_CLINIC_OR_DEPARTMENT_OTHER): Payer: Self-pay

## 2022-11-06 ENCOUNTER — Ambulatory Visit (INDEPENDENT_AMBULATORY_CARE_PROVIDER_SITE_OTHER): Payer: Medicare Other

## 2022-11-06 DIAGNOSIS — I442 Atrioventricular block, complete: Secondary | ICD-10-CM

## 2022-11-08 ENCOUNTER — Encounter: Payer: Self-pay | Admitting: Family Medicine

## 2022-11-08 ENCOUNTER — Ambulatory Visit (INDEPENDENT_AMBULATORY_CARE_PROVIDER_SITE_OTHER): Payer: Medicare Other | Admitting: Family Medicine

## 2022-11-08 ENCOUNTER — Ambulatory Visit: Payer: Medicare Other

## 2022-11-08 ENCOUNTER — Other Ambulatory Visit (HOSPITAL_BASED_OUTPATIENT_CLINIC_OR_DEPARTMENT_OTHER): Payer: Self-pay

## 2022-11-08 VITALS — BP 124/80 | HR 73 | Temp 97.9°F | Resp 18 | Ht 63.0 in | Wt 154.2 lb

## 2022-11-08 DIAGNOSIS — M5030 Other cervical disc degeneration, unspecified cervical region: Secondary | ICD-10-CM | POA: Diagnosis not present

## 2022-11-08 DIAGNOSIS — M542 Cervicalgia: Secondary | ICD-10-CM | POA: Insufficient documentation

## 2022-11-08 LAB — CUP PACEART REMOTE DEVICE CHECK
Battery Impedance: 3252 Ohm
Battery Remaining Longevity: 17 mo
Battery Voltage: 2.7 V
Brady Statistic AP VP Percent: 1 %
Brady Statistic AP VS Percent: 0 %
Brady Statistic AS VP Percent: 99 %
Brady Statistic AS VS Percent: 0 %
Date Time Interrogation Session: 20240510135206
Implantable Lead Connection Status: 753985
Implantable Lead Connection Status: 753985
Implantable Lead Implant Date: 20041110
Implantable Lead Implant Date: 20041110
Implantable Lead Location: 753859
Implantable Lead Location: 753860
Implantable Pulse Generator Implant Date: 20130318
Lead Channel Impedance Value: 388 Ohm
Lead Channel Impedance Value: 494 Ohm
Lead Channel Pacing Threshold Amplitude: 0.625 V
Lead Channel Pacing Threshold Amplitude: 0.625 V
Lead Channel Pacing Threshold Pulse Width: 0.4 ms
Lead Channel Pacing Threshold Pulse Width: 0.4 ms
Lead Channel Setting Pacing Amplitude: 2 V
Lead Channel Setting Pacing Amplitude: 2.5 V
Lead Channel Setting Pacing Pulse Width: 0.4 ms
Lead Channel Setting Sensing Sensitivity: 4 mV
Zone Setting Status: 755011
Zone Setting Status: 755011

## 2022-11-08 MED ORDER — TRAMADOL HCL 50 MG PO TABS: 50.0000 mg | ORAL_TABLET | Freq: Three times a day (TID) | ORAL | 0 refills | Status: AC | PRN

## 2022-11-08 MED ORDER — CYCLOBENZAPRINE HCL 5 MG PO TABS: 5.0000 mg | ORAL_TABLET | Freq: Three times a day (TID) | ORAL | 1 refills | Status: DC | PRN

## 2022-11-08 MED ORDER — LEVOCETIRIZINE DIHYDROCHLORIDE 5 MG PO TABS: 5.0000 mg | ORAL_TABLET | Freq: Every evening | ORAL | 3 refills | Status: DC

## 2022-11-08 NOTE — Progress Notes (Signed)
Established Patient Office Visit  Subjective   Patient ID: [name], female    DOB: 02-15-1941  Age: 82 y.o. MRN: [id number]  Chief Complaint  Patient presents with   Neck Pain    Pt states pain has continued and states some pain radiates from the elbow to the hand    HPI Pt here c/o con't neck pain and L elbow pain.  She has an appointment wih surgery for the lipoma. She now has pain with any movement of her head.  + popping and popping with int and ext rotation of forearm  Patient Active Problem List   Diagnosis Date Noted   Neck pain 11/08/2022   DDD (degenerative disc disease), cervical 11/08/2022   Acute bilateral low back pain with bilateral sciatica 04/25/2022   History of vertebral compression fracture 04/25/2022   Non-recurrent acute serous otitis media of right ear 09/02/2021   Chronic diastolic heart failure (HCC) 07/04/2021   Aortic ectasia, unspecified site (HCC) 07/04/2021   SOB (shortness of breath) 06/15/2021   Pulmonary nodule 06/15/2021   Hypokalemia 10/02/2020   Neuropathy 10/02/2020   Elevated serum creatinine 10/02/2020   AKI (acute kidney injury) (HCC) 09/15/2020   Left-sided chest pain    Congestion of nasal sinus    Substernal chest pain 09/13/2020   Spider veins 04/05/2020   Varicose veins of bilateral lower extremities with other complications 04/05/2020   H/O seasonal allergies 04/05/2020   Numbness and tingling of both feet 12/23/2019   Paroxysmal atrial fibrillation (HCC) 07/22/2019   V-tach (HCC) 07/22/2019   Lipoma of left upper extremity 03/11/2019   Hordeolum externum of left upper eyelid 02/15/2019   Hyponatremia 02/15/2019   Hyperlipidemia 06/03/2017   Renal mass 03/20/2016   Fecal impaction (HCC) 10/16/2013   Vertebral compression fracture (HCC) 10/16/2013   Constipation 10/15/2013   Abdominal pain 10/15/2013   PUD (peptic ulcer disease) 10/15/2013   Low back pain with radiation 10/15/2013   Hypothyroidism, 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/14/2012   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 08/2011 Upgrade of PPM to MDT Adapta L Dual Chamber PPM ser # [id number].   Cardiac pacemaker in situ    Contact dermatitis and eczema    unspec cause   Dairy product intolerance    Depression    Diastolic dysfunction    GERD (gastroesophageal reflux disease)    Hiatal hernia    History of diverticulitis of colon    2003  perforated diverticulitis w/ surgical intervention   History of kidney stones    age 44   History of peripheral edema    lower extremities   History of thyroid nodule    multinodular goiter  s/p  total thyroidectomy 2014   HTN (hypertension)    Hydronephrosis, left    Hypothyroidism    Mild sleep apnea    per study 04-14-2014   Nocturia    NSVT (nonsustained ventricular tachycardia) (HCC)    Osteoporosis  PAF (paroxysmal atrial fibrillation) (HCC)    PONV (postoperative nausea and vomiting)    Pulmonary nodules    stable per last ct   Renal cell carcinoma of left kidney (HCC)    Superficial thrombophlebitis    Past Surgical History:  Procedure Laterality Date   CARDIOVASCULAR STRESS TEST  04-20-2015  dr Graciela Husbands   normal nuclear study/  no ischemia/  normal LV function and wall motion, ef 74%   COLONOSCOPY  01/19/2013   COLOSTOMY TAKEDOWN  07-22-2002   CYSTOSCOPY WITH RETROGRADE PYELOGRAM, URETEROSCOPY AND  STENT PLACEMENT Left 07/28/2015   Procedure: CYSTOSCOPY WITH LEFT RETROGRADE PYELOGRAM, POSSIBLE LEFT URETEROSCOPY AND STENT PLACEMENT;  Surgeon: Marcine Matar, MD;  [facility];  Service: Urology;  Laterality: Left;   EXPLORATORY LAPARTOMY SIGMOID COLECTOMY/ COLOSTOMY  04-28-2002   perforated diverticulitis   KYPHOPLASTY     LAPAROSCOPY VENTRAL HERNIA REPAIR/  EXTENSIVE LYSIS ADHESIONS  05-04-2004   PACEMAKER GENERATOR CHANGE Left 09/16/2011   MDT ADDRL1 pacemaker   PACEMAKER PLACEMENT  05-11-2003     medtronic   ROBOT ASSISTED LAPAROSCOPIC NEPHRECTOMY Left 03/20/2016   Procedure: XI ROBOTIC ASSISTED LAPAROSCOPIC RETROPERITONEAL NEPHRECTOMY;  Surgeon: Sebastian Ache, MD;  Location: [facility];  Service: Urology;  Laterality: Left;   THYROIDECTOMY N/A 08/07/2012   Procedure: THYROIDECTOMY;  Surgeon: Velora Heckler, MD;  Location: [facility];  Service: General;  Laterality: N/A;   TRANSTHORACIC ECHOCARDIOGRAM  10-12-2014   moderate focal basal LVH,  ef 55-60%,  grade 2 diastolic dysfunction, mild AV calcification without stenosis,  mild MR,  trivial PR   Social History   Tobacco Use   Smoking status: Former    Years: 15    Types: Cigarettes    Quit date: 10/06/1979    Years since quitting: 43.1   Smokeless tobacco: Never  Vaping Use   Vaping Use: Never used  Substance Use Topics   Alcohol use: No   Drug use: No   Social History   Socioeconomic History   Marital status: Single    Spouse name: Not on file   Number of children: 2   Years of education: 16   Highest education level: Not on file  Occupational History   Occupation: Retired Nurse, adult for the [employer]    Employer: RETIRED  Tobacco Use   Smoking status: Former    Years: 15    Types: Cigarettes    Quit date: 10/06/1979    Years since quitting: 43.1   Smokeless tobacco: Never  Vaping Use   Vaping Use: Never used  Substance and Sexual Activity   Alcohol use: No   Drug use: No    Sexual activity: Not on file  Other Topics Concern   Not on file  Social History Narrative   The patient is a Buyer, retail of [employer].  She worked for the [employer] in [employer].  She was married for 22 years, divorced, and has remained single. She has 1 grown son, 1 grown daughter.  She has 3 grandchildren, 2 of her children   live in [place].  She is retired.  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 She had a long term companion, 32 years, who passed away 11-27-10.   Social Determinants of Health   Financial Resource Strain: Low Risk  (03/21/2021)   Overall Financial Resource Strain (CARDIA)    Difficulty of Paying Living Expenses: Not hard at all  Food  Insecurity: No Food Insecurity (03/08/2022)   Hunger Vital Sign    Worried About Running Out of Food in the Last Year: Never true    Ran Out of Food in the Last Year: Never true  Transportation Needs: No Transportation Needs (03/08/2022)   PRAPARE - Administrator, Civil Service (Medical): No    Lack of Transportation (Non-Medical): No  Physical Activity: Insufficiently Active (03/21/2021)   Exercise Vital Sign    Days of Exercise per Week: 4 days    Minutes of Exercise per Session: 30 min  Stress: No Stress Concern Present (03/21/2021)   [employer] of Occupational Health - Occupational Stress Questionnaire    Feeling of Stress : Not at all  Social Connections: Socially Isolated (03/21/2021)   Social Connection and Isolation Panel [NHANES]    Frequency of Communication with Friends and Family: More than three times a week    Frequency of Social Gatherings with Friends and Family: More than three times a week    Attends Religious Services: Never    Database administrator or Organizations: No    Attends Banker Meetings: Never    Marital Status: Divorced  Catering manager Violence: Not At Risk (03/21/2021)   Humiliation, Afraid, Rape, and Kick questionnaire     Fear of Current or Ex-Partner: No    Emotionally Abused: No    Physically Abused: No    Sexually Abused: No   Family Status  Relation Name Status   Mother  Deceased   Father  Deceased   Sister  (Not Specified)   Brother  Deceased at age 36       MI   Brother  (Not Specified)   Son  (Not Specified)   Neg Hx  (Not Specified)   Family History  Problem Relation Age of Onset   Heart disease Mother        Stent in her 59s   Stroke Mother    Hypertension Mother    Heart failure Father    Diverticulosis Sister    Hypertension Brother    Heart disease Brother    Hypertension Brother    Heart attack Neg Hx    Allergies  Allergen Reactions   Tape Rash   Codeine Nausea And Vomiting      Review of Systems  Constitutional:  Negative for fever and malaise/fatigue.  HENT:  Negative for congestion.   Eyes:  Negative for blurred vision.  Respiratory:  Negative for cough and shortness of breath.   Cardiovascular:  Negative for chest pain, palpitations and leg swelling.  Gastrointestinal:  Negative for vomiting.  Musculoskeletal:  Negative for back pain.  Skin:  Negative for rash.  Neurological:  Negative for loss of consciousness and headaches.      Objective:     BP 124/80 (BP Location: Right Arm, Patient Position: Sitting, Cuff Size: Normal)   Pulse 73   Temp 97.9 F (36.6 C) (Oral)   Resp 18   Ht 5\' 3"  (1.6 m)   Wt 154 lb 3.2 oz (69.9 kg)   SpO2 95%   BMI 27.32 kg/m  BP Readings from Last 3 Encounters:  11/08/22 124/80  10/25/22 118/70  10/17/22 (!) 152/84   Wt Readings from Last 3 Encounters:  11/08/22 154 lb 3.2 oz (69.9 kg)  10/25/22 154 lb 6.4 oz (70 kg)  10/17/22 154 lb 6.4 oz (70 kg)   SpO2 Readings from Last 3 Encounters:  11/08/22 95%  10/25/22 99%  10/17/22 98%      Physical Exam Vitals and nursing note reviewed.  Constitutional:      Appearance: She is well-developed.  HENT:     Head: Normocephalic and atraumatic.  Eyes:      Conjunctiva/sclera: Conjunctivae normal.  Neck:     Thyroid: No thyromegaly.     Vascular: No carotid bruit or JVD.  Cardiovascular:     Rate and Rhythm: Normal rate and regular rhythm.     Heart sounds: Normal heart sounds. No murmur heard. Pulmonary:     Effort: Pulmonary effort is normal. No respiratory distress.     Breath sounds: Normal breath sounds. No wheezing or rales.  Chest:     Chest wall: No tenderness.  Musculoskeletal:        General: Tenderness present.     Left elbow: Normal range of motion. Tenderness present.       Arms:     Cervical back: Neck supple. Tenderness and crepitus present. No rigidity or spasms. Pain with movement present. Decreased range of motion.  Neurological:     Mental Status: She is alert and oriented to person, place, and time.      No results found for any visits on 11/08/22.  Last CBC Lab Results  Component Value Date   WBC 5.0 08/27/2021   HGB 12.9 08/27/2021   HCT 38.8 08/27/2021   MCV 83.1 08/27/2021   MCH 27.5 06/06/2021   RDW 13.7 08/27/2021   PLT 154.0 08/27/2021   Last metabolic panel Lab Results  Component Value Date   GLUCOSE 92 08/27/2021   NA 136 08/27/2021   K 5.0 08/27/2021   CL 99 08/27/2021   CO2 29 08/27/2021   BUN 26 (H) 08/27/2021   CREATININE 1.20 08/27/2021   GFRNONAA 59 (L) 06/06/2021   CALCIUM 9.8 08/27/2021   PROT 7.2 08/27/2021   ALBUMIN 4.7 08/27/2021   BILITOT 0.7 08/27/2021   ALKPHOS 47 08/27/2021   AST 23 08/27/2021   ALT 13 08/27/2021   ANIONGAP 11 06/06/2021   Last lipids Lab Results  Component Value Date   CHOL 175 07/12/2019   HDL 49.90 07/12/2019   LDLCALC 106 (H) 07/12/2019   TRIG 98.0 07/12/2019   CHOLHDL 4 07/12/2019   Last hemoglobin A1c Lab Results  Component Value Date   HGBA1C 5.5 06/07/2016   Last thyroid functions Lab Results  Component Value Date   TSH 1.03 11/22/2021   T4TOTAL 12.3 03/10/2012   Last vitamin D Lab Results  Component Value Date   VD25OH  83.86 11/22/2021   Last vitamin B12 and Folate Lab Results  Component Value Date   VITAMINB12 517 12/11/2020      The ASCVD Risk score (Arnett DK, et al., 2019) failed to calculate for the following reasons:   The 2019 ASCVD risk score is only valid for ages 29 to 35    Assessment & Plan:   Problem List Items Addressed This Visit       Unprioritized   Neck pain - Primary   Relevant Medications   levocetirizine (XYZAL) 5 MG tablet   cyclobenzaprine (FLEXERIL) 5 MG tablet   traMADol (ULTRAM) 50 MG tablet   Other Relevant Orders   Ambulatory referral to Orthopedic Surgery   DDD (degenerative disc disease), cervical    Causing significant pain Xray reviewed  Tramadol and muscle relaxer sent in Refer to ortho      Relevant Medications   cyclobenzaprine (FLEXERIL) 5 MG tablet   traMADol (ULTRAM)  50 MG tablet   Other Relevant Orders   Ambulatory referral to Orthopedic Surgery    No follow-ups on file.    Donato Schultz, DO

## 2022-11-08 NOTE — Assessment & Plan Note (Signed)
Causing significant pain Xray reviewed  Tramadol and muscle relaxer sent in Refer to ortho

## 2022-11-11 ENCOUNTER — Other Ambulatory Visit: Payer: Self-pay | Admitting: Family Medicine

## 2022-11-11 ENCOUNTER — Other Ambulatory Visit (HOSPITAL_BASED_OUTPATIENT_CLINIC_OR_DEPARTMENT_OTHER): Payer: Self-pay

## 2022-11-12 ENCOUNTER — Other Ambulatory Visit (HOSPITAL_BASED_OUTPATIENT_CLINIC_OR_DEPARTMENT_OTHER): Payer: Self-pay

## 2022-11-14 NOTE — Telephone Encounter (Signed)
Pt ready for scheduling on or after 07/13/22  Out-of-pocket cost due at time of visit: $0  Primary: Medicare Prolia co-insurance: 20% (approximately $302) Admin fee co-insurance: 20% (approximately $25)  Deductible: $240 of $240 met  Secondary: [place] [employer] [place] Prolia co-insurance: will consider the Medicare Part B deductible and co-insurance Admin fee co-insurance: will consider the Medicare Part B deductible and co-insurance  Deductible: will consider the Medicare Part B deductible and co-insurance  Prior Auth: NOT required PA# Valid:   ** This summary of benefits is an estimation of the patient's out-of-pocket cost. Exact cost may vary based on individual plan coverage.

## 2022-11-15 ENCOUNTER — Encounter: Payer: Self-pay | Admitting: Family Medicine

## 2022-11-15 ENCOUNTER — Other Ambulatory Visit (HOSPITAL_BASED_OUTPATIENT_CLINIC_OR_DEPARTMENT_OTHER): Payer: Self-pay

## 2022-11-15 DIAGNOSIS — M542 Cervicalgia: Secondary | ICD-10-CM

## 2022-11-15 MED ORDER — LEVOCETIRIZINE DIHYDROCHLORIDE 5 MG PO TABS
5.0000 mg | ORAL_TABLET | Freq: Every evening | ORAL | 3 refills | Status: DC
  Filled 2022-11-15: qty 90, 90d supply, fill #0
  Filled 2023-02-15: qty 90, 90d supply, fill #1
  Filled 2023-05-11: qty 90, 90d supply, fill #2
  Filled 2023-08-12: qty 90, 90d supply, fill #3

## 2022-11-22 ENCOUNTER — Ambulatory Visit (INDEPENDENT_AMBULATORY_CARE_PROVIDER_SITE_OTHER): Payer: Medicare Other

## 2022-11-22 VITALS — BP 128/72 | HR 76 | Ht 63.0 in | Wt 152.4 lb

## 2022-11-22 DIAGNOSIS — M810 Age-related osteoporosis without current pathological fracture: Secondary | ICD-10-CM | POA: Diagnosis not present

## 2022-11-22 MED ORDER — DENOSUMAB 60 MG/ML ~~LOC~~ SOSY
60.0000 mg | PREFILLED_SYRINGE | Freq: Once | SUBCUTANEOUS | Status: AC
  Administered 2022-11-22: 60 mg via SUBCUTANEOUS

## 2022-11-22 NOTE — Progress Notes (Signed)
Patient verbally confirmed name, date of birth, and correct medication to be administered. Prolia injection administered and pt tolerated well.  

## 2022-11-27 NOTE — Progress Notes (Signed)
Remote pacemaker transmission.   

## 2022-12-05 ENCOUNTER — Other Ambulatory Visit (HOSPITAL_BASED_OUTPATIENT_CLINIC_OR_DEPARTMENT_OTHER): Payer: Self-pay

## 2022-12-05 ENCOUNTER — Encounter: Payer: Self-pay | Admitting: Family Medicine

## 2022-12-05 ENCOUNTER — Other Ambulatory Visit: Payer: Self-pay | Admitting: Family Medicine

## 2022-12-05 DIAGNOSIS — M546 Pain in thoracic spine: Secondary | ICD-10-CM

## 2022-12-05 DIAGNOSIS — M545 Low back pain, unspecified: Secondary | ICD-10-CM

## 2022-12-05 MED ORDER — TRAMADOL HCL 50 MG PO TABS: 50.0000 mg | ORAL_TABLET | Freq: Four times a day (QID) | ORAL | 0 refills | Status: DC | PRN

## 2022-12-05 MED ORDER — TRAMADOL HCL 50 MG PO TABS
50.0000 mg | ORAL_TABLET | Freq: Four times a day (QID) | ORAL | 0 refills | Status: DC | PRN
  Filled 2022-12-05: qty 60, 8d supply, fill #0

## 2022-12-05 NOTE — Telephone Encounter (Signed)
Please see below in regard to Tramadol:  The rx was sent to  [facility] [address], [address], [place] [address]    She would like it sent to [address].

## 2022-12-06 ENCOUNTER — Other Ambulatory Visit: Payer: Self-pay | Admitting: Family Medicine

## 2022-12-06 ENCOUNTER — Encounter: Payer: Self-pay | Admitting: Family Medicine

## 2022-12-06 ENCOUNTER — Other Ambulatory Visit (HOSPITAL_BASED_OUTPATIENT_CLINIC_OR_DEPARTMENT_OTHER): Payer: Self-pay

## 2022-12-06 DIAGNOSIS — M545 Low back pain, unspecified: Secondary | ICD-10-CM

## 2022-12-06 DIAGNOSIS — M546 Pain in thoracic spine: Secondary | ICD-10-CM

## 2022-12-06 MED ORDER — TRAMADOL HCL 50 MG PO TABS
50.0000 mg | ORAL_TABLET | Freq: Four times a day (QID) | ORAL | 0 refills | Status: DC | PRN
  Filled 2022-12-06: qty 60, 8d supply, fill #0

## 2022-12-17 NOTE — Telephone Encounter (Signed)
Last Prolia inj 11/22/22 Next Prolia inj due 05/26/23 

## 2023-01-06 ENCOUNTER — Ambulatory Visit (HOSPITAL_BASED_OUTPATIENT_CLINIC_OR_DEPARTMENT_OTHER)
Admission: RE | Admit: 2023-01-06 | Discharge: 2023-01-06 | Disposition: A | Discharge status: Home / Self Care | Payer: Medicare Other | Source: Ambulatory Visit | Attending: Family Medicine | Admitting: Family Medicine

## 2023-01-06 ENCOUNTER — Ambulatory Visit (INDEPENDENT_AMBULATORY_CARE_PROVIDER_SITE_OTHER): Payer: Medicare Other | Admitting: Family Medicine

## 2023-01-06 ENCOUNTER — Encounter: Payer: Self-pay | Admitting: Family Medicine

## 2023-01-06 VITALS — BP 138/78 | HR 90 | Temp 97.8°F | Resp 18 | Ht 63.0 in | Wt 153.4 lb

## 2023-01-06 DIAGNOSIS — E039 Hypothyroidism, unspecified: Secondary | ICD-10-CM

## 2023-01-06 DIAGNOSIS — M5136 Other intervertebral disc degeneration, lumbar region: Secondary | ICD-10-CM | POA: Diagnosis not present

## 2023-01-06 DIAGNOSIS — E785 Hyperlipidemia, unspecified: Secondary | ICD-10-CM

## 2023-01-06 DIAGNOSIS — M545 Low back pain, unspecified: Secondary | ICD-10-CM | POA: Insufficient documentation

## 2023-01-06 DIAGNOSIS — M5030 Other cervical disc degeneration, unspecified cervical region: Secondary | ICD-10-CM

## 2023-01-06 DIAGNOSIS — M51369 Other intervertebral disc degeneration, lumbar region without mention of lumbar back pain or lower extremity pain: Secondary | ICD-10-CM | POA: Insufficient documentation

## 2023-01-06 DIAGNOSIS — I10 Essential (primary) hypertension: Secondary | ICD-10-CM | POA: Diagnosis not present

## 2023-01-06 DIAGNOSIS — E890 Postprocedural hypothyroidism: Secondary | ICD-10-CM | POA: Diagnosis not present

## 2023-01-06 LAB — CBC WITH DIFFERENTIAL/PLATELET
Basophils Absolute: 0 10*3/uL (ref 0.0–0.1)
Basophils Relative: 0.7 % (ref 0.0–3.0)
Eosinophils Absolute: 0.1 10*3/uL (ref 0.0–0.7)
Eosinophils Relative: 1.4 % (ref 0.0–5.0)
HCT: 39.2 % (ref 36.0–46.0)
Hemoglobin: 12.6 g/dL (ref 12.0–15.0)
Lymphocytes Relative: 19.5 % (ref 12.0–46.0)
Lymphs Abs: 1.1 10*3/uL (ref 0.7–4.0)
MCHC: 32.1 g/dL (ref 30.0–36.0)
MCV: 83.9 fl (ref 78.0–100.0)
Monocytes Absolute: 0.5 10*3/uL (ref 0.1–1.0)
Monocytes Relative: 10.1 % (ref 3.0–12.0)
Neutro Abs: 3.7 10*3/uL (ref 1.4–7.7)
Neutrophils Relative %: 68.3 % (ref 43.0–77.0)
Platelets: 177 10*3/uL (ref 150.0–400.0)
RBC: 4.67 Mil/uL (ref 3.87–5.11)
RDW: 13.4 % (ref 11.5–15.5)
WBC: 5.4 10*3/uL (ref 4.0–10.5)

## 2023-01-06 LAB — LIPID PANEL
Cholesterol: 172 mg/dL (ref 0–200)
HDL: 47.2 mg/dL (ref >39.00)
LDL Cholesterol: 107 mg/dL — ABNORMAL HIGH (ref 0–99)
NonHDL: 124.44
Total CHOL/HDL Ratio: 4
Triglycerides: 89 mg/dL (ref 0.0–149.0)
VLDL: 17.8 mg/dL (ref 0.0–40.0)

## 2023-01-06 LAB — COMPREHENSIVE METABOLIC PANEL
ALT: 16 U/L (ref 0–35)
AST: 24 U/L (ref 0–37)
Albumin: 4.6 g/dL (ref 3.5–5.2)
Alkaline Phosphatase: 61 U/L (ref 39–117)
BUN: 33 mg/dL — ABNORMAL HIGH (ref 6–23)
CO2: 29 mEq/L (ref 19–32)
Calcium: 10.1 mg/dL (ref 8.4–10.5)
Chloride: 96 mEq/L (ref 96–112)
Creatinine, Ser: 1.02 mg/dL (ref 0.40–1.20)
GFR: 50.55 mL/min — ABNORMAL LOW (ref >60.00)
Glucose, Bld: 90 mg/dL (ref 70–99)
Potassium: 4.6 mEq/L (ref 3.5–5.1)
Sodium: 134 mEq/L — ABNORMAL LOW (ref 135–145)
Total Bilirubin: 0.5 mg/dL (ref 0.2–1.2)
Total Protein: 7 g/dL (ref 6.0–8.3)

## 2023-01-06 LAB — TSH: TSH: 4.19 u[IU]/mL (ref 0.35–5.50)

## 2023-01-06 NOTE — Addendum Note (Signed)
Addended by: Seabron Spates R on: 01/06/2023 11:01 AM   Modules accepted: Orders

## 2023-01-06 NOTE — Assessment & Plan Note (Signed)
Encourage heart healthy diet such as MIND or DASH diet, increase exercise, avoid trans fats, simple carbohydrates and processed foods, consider a krill or fish or flaxseed oil cap daily.  °

## 2023-01-06 NOTE — Progress Notes (Signed)
Established Patient Office Visit  Subjective   Patient ID: [name], female    DOB: [birth year]  Age: 82 y.o. MRN: [id number]  Chief Complaint  Patient presents with   Back Pain    X1 month, pt states having lower back, right hip and right leg pain. No falls or injuries.     HPI Discussed the use of AI scribe software for clinical note transcription with the patient, who gave verbal consent to proceed.  History of Present Illness   The patient, with a history of degenerative disc disease in the neck, presents with new onset right lower back and hip pain that radiates to the knee. The pain started in late May, shortly after her last visit. She denies any inciting event or weakness in the legs. She also reports a sensation of imbalance when walking, but denies any falls or near falls. She has been managing the pain with tramadol and Tylenol, which provides some relief. She is currently undergoing physical therapy for her neck and shoulder pain.      Patient Active Problem List   Diagnosis Date Noted   Degenerative disc disease, lumbar 01/06/2023   Neck pain 11/08/2022   Degenerative disc disease, cervical 11/08/2022   Acute bilateral low back pain with bilateral sciatica 04/25/2022   History of vertebral compression fracture 04/25/2022   Non-recurrent acute serous otitis media of right ear 09/02/2021   Chronic diastolic heart failure (HCC) 07/04/2021   Aortic ectasia, unspecified site (HCC) 07/04/2021   SOB (shortness of breath) 06/15/2021   Pulmonary nodule 06/15/2021   Hypokalemia 10/02/2020   Neuropathy 10/02/2020   Elevated serum creatinine 10/02/2020   AKI (acute kidney injury) (HCC) 09/15/2020   Left-sided chest pain    Congestion of nasal sinus    Substernal chest pain 09/13/2020   Spider veins 04/05/2020   Varicose veins of bilateral lower extremities with other complications 04/05/2020   H/O seasonal allergies 04/05/2020   Numbness and tingling of both feet  12/23/2019   Paroxysmal atrial fibrillation (HCC) 07/22/2019   V-tach (HCC) 07/22/2019   Lipoma of left upper extremity 03/11/2019   Hordeolum externum of left upper eyelid 02/15/2019   Hyponatremia 02/15/2019   Hyperlipidemia 06/03/2017   Renal mass 03/20/2016   Fecal impaction (HCC) 10/16/2013   Vertebral compression fracture (HCC) 10/16/2013   Constipation 10/15/2013   Abdominal pain 10/15/2013   PUD (peptic ulcer disease) 10/15/2013   Low back pain with radiation 10/15/2013   Hypothyroidism, 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 08/2011 Upgrade of PPM to MDT Adapta L Dual Chamber PPM ser # [id number].   Cardiac pacemaker in situ    Contact dermatitis and eczema    unspec cause   Dairy product intolerance    Depression    Diastolic dysfunction    GERD (gastroesophageal reflux disease)    Hiatal hernia    History of diverticulitis of colon  2003  perforated diverticulitis w/ surgical intervention   History of kidney stones    age 28   History of peripheral edema    lower extremities   History of thyroid nodule    multinodular goiter  s/p  total thyroidectomy 2014   HTN (hypertension)    Hydronephrosis, left    Hypothyroidism    Mild sleep apnea    per study 04-14-2014   Nocturia    NSVT (nonsustained ventricular tachycardia) (HCC)     Osteoporosis    PAF (paroxysmal atrial fibrillation) (HCC)    PONV (postoperative nausea and vomiting)    Pulmonary nodules    stable per last ct   Renal cell carcinoma of left kidney (HCC)    Superficial thrombophlebitis    Past Surgical History:  Procedure Laterality Date   CARDIOVASCULAR STRESS TEST  04-20-2015  dr Graciela Husbands   normal nuclear study/  no ischemia/  normal LV function and wall motion, ef 74%   COLONOSCOPY  01/19/2013   COLOSTOMY TAKEDOWN  07-22-2002   CYSTOSCOPY WITH RETROGRADE PYELOGRAM, URETEROSCOPY AND STENT PLACEMENT Left 07/28/2015   Procedure: CYSTOSCOPY WITH LEFT RETROGRADE PYELOGRAM, POSSIBLE LEFT URETEROSCOPY AND STENT PLACEMENT;  Surgeon: Marcine Matar, MD;  Location: [facility];  Service: Urology;  Laterality: Left;   EXPLORATORY LAPARTOMY SIGMOID COLECTOMY/ COLOSTOMY  04-28-2002   perforated diverticulitis   KYPHOPLASTY     LAPAROSCOPY VENTRAL HERNIA REPAIR/  EXTENSIVE LYSIS ADHESIONS  05-04-2004   PACEMAKER GENERATOR CHANGE Left 09/16/2011   MDT ADDRL1 pacemaker   PACEMAKER PLACEMENT  05-11-2003     medtronic   ROBOT ASSISTED LAPAROSCOPIC NEPHRECTOMY Left 03/20/2016   Procedure: XI ROBOTIC ASSISTED LAPAROSCOPIC RETROPERITONEAL NEPHRECTOMY;  Surgeon: Sebastian Ache, MD;  Location: [facility];  Service: Urology;  Laterality: Left;   THYROIDECTOMY N/A 08/07/2012   Procedure: THYROIDECTOMY;  Surgeon: Velora Heckler, MD;  Location: [facility];  Service: General;  Laterality: N/A;   TRANSTHORACIC ECHOCARDIOGRAM  10-12-2014   moderate focal basal LVH,  ef 55-60%,  grade 2 diastolic dysfunction, mild AV calcification without stenosis,  mild MR,  trivial PR   Social History   Tobacco Use   Smoking status: Former    Years: 15    Types: Cigarettes    Quit date: 10/06/1979    Years since quitting: 43.2   Smokeless tobacco: Never  Vaping Use   Vaping Use: Never used  Substance Use Topics   Alcohol use: No   Drug use: No   Social History   Socioeconomic  History   Marital status: Single    Spouse name: Not on file   Number of children: 2   Years of education: 16   Highest education level: Not on file  Occupational History   Occupation: Retired Nurse, adult for the [employer]    Employer: RETIRED  Tobacco Use   Smoking status: Former    Years: 15    Types: Cigarettes    Quit date: 10/06/1979    Years since quitting: 43.2   Smokeless tobacco: Never  Vaping Use   Vaping Use: Never used  Substance and Sexual Activity   Alcohol use: No   Drug use: No   Sexual activity: Not on file  Other Topics Concern   Not on file  Social History Narrative   The patient is a Buyer, retail of [employer].  She worked for the [employer] in [employer].  She was married for 22 years, divorced, and has remained  single. She has 1 grown son, 1 grown daughter.  She has 3 grandchildren, 2 of her children   live in [place].  She is retired.  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 She had a long term companion, 32 years, who passed away Jan 28, 2011.   Social Determinants of Health   Financial Resource Strain: Low Risk  (03/21/2021)   Overall Financial Resource Strain (CARDIA)    Difficulty of Paying Living Expenses: Not hard at all  Food Insecurity: No Food Insecurity (03/08/2022)   Hunger Vital Sign    Worried About Running Out of Food in the Last Year: Never true    Ran Out of Food in the Last Year: Never true  Transportation Needs: No Transportation Needs (03/08/2022)   PRAPARE - Administrator, Civil Service (Medical): No    Lack of Transportation (Non-Medical): No  Physical Activity: Insufficiently Active (03/21/2021)   Exercise Vital Sign    Days of Exercise per Week: 4 days    Minutes of Exercise per Session: 30 min  Stress: No Stress Concern Present (03/21/2021)   [employer] of Occupational Health - Occupational Stress Questionnaire    Feeling of Stress : Not at all  Social  Connections: Socially Isolated (03/21/2021)   Social Connection and Isolation Panel [NHANES]    Frequency of Communication with Friends and Family: More than three times a week    Frequency of Social Gatherings with Friends and Family: More than three times a week    Attends Religious Services: Never    Database administrator or Organizations: No    Attends Banker Meetings: Never    Marital Status: Divorced  Catering manager Violence: Not At Risk (03/21/2021)   Humiliation, Afraid, Rape, and Kick questionnaire    Fear of Current or Ex-Partner: No    Emotionally Abused: No    Physically Abused: No    Sexually Abused: No   Family Status  Relation Name Status   Mother  Deceased   Father  Deceased   Sister  (Not Specified)   Brother  Deceased at age 52       MI   Brother  (Not Specified)   Son  (Not Specified)   Neg Hx  (Not Specified)   Family History  Problem Relation Age of Onset   Heart disease Mother        Stent in her 85s   Stroke Mother    Hypertension Mother    Heart failure Father    Diverticulosis Sister    Hypertension Brother    Heart disease Brother    Hypertension Brother    Heart attack Neg Hx    Allergies  Allergen Reactions   Tape Rash   Codeine Nausea And Vomiting      Review of Systems  Constitutional:  Negative for chills, fever and malaise/fatigue.  HENT:  Negative for congestion and hearing loss.   Eyes:  Negative for blurred vision and discharge.  Respiratory:  Negative for cough, sputum production and shortness of breath.   Cardiovascular:  Negative for chest pain, palpitations and leg swelling.  Gastrointestinal:  Negative for abdominal pain, blood in stool, constipation, diarrhea, heartburn, nausea and vomiting.  Genitourinary:  Negative for dysuria, frequency, hematuria and urgency.  Musculoskeletal:  Negative for back pain, falls and myalgias.  Skin:  Negative for rash.  Neurological:  Negative for dizziness, sensory change,  loss of consciousness, weakness and headaches.  Endo/Heme/Allergies:  Negative for environmental allergies. Does not bruise/bleed easily.  Psychiatric/Behavioral:  Negative for depression and suicidal ideas. The patient is not nervous/anxious and does not have insomnia.       Objective:     BP 138/78 (BP Location: Right Arm, Patient Position: Sitting, Cuff Size: Normal)   Pulse 90   Temp 97.8 F (36.6 C) (Oral)   Resp 18   Ht 5\' 3"  (1.6 m)   Wt 153 lb 6.4 oz (69.6 kg)   SpO2 94%   BMI 27.17 kg/m  BP Readings from Last 3 Encounters:  01/06/23 138/78  11/22/22 128/72  11/08/22 124/80   Wt Readings from Last 3 Encounters:  01/06/23 153 lb 6.4 oz (69.6 kg)  11/22/22 152 lb 6.4 oz (69.1 kg)  11/08/22 154 lb 3.2 oz (69.9 kg)   SpO2 Readings from Last 3 Encounters:  01/06/23 94%  11/22/22 99%  11/08/22 95%      Physical Exam Vitals and nursing note reviewed.  Constitutional:      General: She is not in acute distress.    Appearance: Normal appearance. She is well-developed.  HENT:     Head: Normocephalic and atraumatic.  Eyes:     General: No scleral icterus.       Right eye: No discharge.        Left eye: No discharge.  Cardiovascular:     Rate and Rhythm: Normal rate and regular rhythm.     Heart sounds: No murmur heard. Pulmonary:     Effort: Pulmonary effort is normal. No respiratory distress.     Breath sounds: Normal breath sounds.  Musculoskeletal:        General: Normal range of motion.     Cervical back: Normal range of motion and neck supple.     Right lower leg: No edema.     Left lower leg: No edema.  Skin:    General: Skin is warm and dry.  Neurological:     General: No focal deficit present.     Mental Status: She is alert and oriented to person, place, and time.     Motor: No weakness.     Gait: Gait normal.     Deep Tendon Reflexes: Reflexes normal.  Psychiatric:        Mood and Affect: Mood normal.        Behavior: Behavior normal.         Thought Content: Thought content normal.        Judgment: Judgment normal.      No results found for any visits on 01/06/23.  Last CBC Lab Results  Component Value Date   WBC 5.0 08/27/2021   HGB 12.9 08/27/2021   HCT 38.8 08/27/2021   MCV 83.1 08/27/2021   MCH 27.5 06/06/2021   RDW 13.7 08/27/2021   PLT 154.0 08/27/2021   Last metabolic panel Lab Results  Component Value Date   GLUCOSE 92 08/27/2021   NA 136 08/27/2021   K 5.0 08/27/2021   CL 99 08/27/2021   CO2 29 08/27/2021   BUN 26 (H) 08/27/2021   CREATININE 1.20 08/27/2021   GFR 41.99 (L) 08/27/2021   CALCIUM 9.8 08/27/2021   PROT 7.2 08/27/2021   ALBUMIN 4.7 08/27/2021   BILITOT 0.7 08/27/2021   ALKPHOS 47 08/27/2021   AST 23 08/27/2021   ALT 13 08/27/2021   ANIONGAP 11 06/06/2021   Last lipids Lab Results  Component Value Date   CHOL 175 07/12/2019   HDL 49.90 07/12/2019  LDLCALC 106 (H) 07/12/2019   TRIG 98.0 07/12/2019   CHOLHDL 4 07/12/2019   Last hemoglobin A1c Lab Results  Component Value Date   HGBA1C 5.5 06/07/2016   Last thyroid functions Lab Results  Component Value Date   TSH 1.03 11/22/2021   T4TOTAL 12.3 03/10/2012   Last vitamin D Lab Results  Component Value Date   VD25OH 83.86 11/22/2021   Last vitamin B12 and Folate Lab Results  Component Value Date   VITAMINB12 517 12/11/2020      The ASCVD Risk score (Arnett DK, et al., 2019) failed to calculate for the following reasons:   The 2019 ASCVD risk score is only valid for ages 73 to 37    Assessment & Plan:   Problem List Items Addressed This Visit       Unprioritized   Low back pain with radiation - Primary   Relevant Orders   Ambulatory referral to Physical Therapy   Hypothyroidism, postsurgical    Check labs  Con't synthroid      Hyperlipidemia    Encourage heart healthy diet such as MIND or DASH diet, increase exercise, avoid trans fats, simple carbohydrates and processed foods, consider a krill or  fish or flaxseed oil cap daily.        Essential hypertension    Well controlled, no changes to meds. Encouraged heart healthy diet such as the DASH diet and exercise as tolerated.        Relevant Orders   CBC with Differential/Platelet   Comprehensive metabolic panel   Lipid panel   Degenerative disc disease, lumbar   Relevant Orders   Ambulatory referral to Physical Therapy   Degenerative disc disease, cervical   Relevant Orders   Ambulatory referral to Physical Therapy   Other Visit Diagnoses     Hypothyroidism, unspecified type       Relevant Orders   TSH     Assessment and Plan    Low Back Pain with Radiculopathy: New onset of right-sided low back pain radiating to the anterior thigh, possibly muscular in nature. No weakness or instability reported. Previous history of degenerative changes in the spine. -Order lumbar spine x-ray. -Refer to physical therapy for evaluation and treatment, including possible dry needling. -Continue Tramadol as needed for pain control.  General Health Maintenance: Request for routine blood work, including kidney function due to history of single kidney. -Order comprehensive metabolic panel and lipid panel.        No follow-ups on file.    Donato Schultz, DO

## 2023-01-06 NOTE — Patient Instructions (Signed)

## 2023-01-06 NOTE — Assessment & Plan Note (Signed)
Well controlled, no changes to meds. Encouraged heart healthy diet such as the DASH diet and exercise as tolerated.  °

## 2023-01-06 NOTE — Assessment & Plan Note (Signed)
Check labs Cont synthroid 

## 2023-01-07 ENCOUNTER — Other Ambulatory Visit: Payer: Self-pay | Admitting: Internal Medicine

## 2023-01-07 ENCOUNTER — Other Ambulatory Visit (HOSPITAL_BASED_OUTPATIENT_CLINIC_OR_DEPARTMENT_OTHER): Payer: Self-pay

## 2023-01-07 ENCOUNTER — Other Ambulatory Visit: Payer: Self-pay | Admitting: Family Medicine

## 2023-01-07 ENCOUNTER — Other Ambulatory Visit: Payer: Self-pay

## 2023-01-07 DIAGNOSIS — I10 Essential (primary) hypertension: Secondary | ICD-10-CM

## 2023-01-07 DIAGNOSIS — Z889 Allergy status to unspecified drugs, medicaments and biological substances status: Secondary | ICD-10-CM

## 2023-01-07 DIAGNOSIS — M546 Pain in thoracic spine: Secondary | ICD-10-CM

## 2023-01-07 DIAGNOSIS — M545 Low back pain, unspecified: Secondary | ICD-10-CM

## 2023-01-07 DIAGNOSIS — E039 Hypothyroidism, unspecified: Secondary | ICD-10-CM

## 2023-01-07 MED ORDER — LISINOPRIL 40 MG PO TABS
40.0000 mg | ORAL_TABLET | Freq: Every day | ORAL | 0 refills | Status: DC
  Filled 2023-01-07: qty 90, 90d supply, fill #0

## 2023-01-07 MED ORDER — LORATADINE 10 MG PO TABS
10.0000 mg | ORAL_TABLET | Freq: Every day | ORAL | 0 refills | Status: DC | PRN
  Filled 2023-01-07: qty 100, 100d supply, fill #0

## 2023-01-08 ENCOUNTER — Other Ambulatory Visit (HOSPITAL_BASED_OUTPATIENT_CLINIC_OR_DEPARTMENT_OTHER): Payer: Self-pay

## 2023-01-08 MED ORDER — TRAMADOL HCL 50 MG PO TABS
50.0000 mg | ORAL_TABLET | Freq: Four times a day (QID) | ORAL | 0 refills | Status: DC | PRN
  Filled 2023-01-08: qty 60, 8d supply, fill #0

## 2023-01-08 NOTE — Telephone Encounter (Signed)
Requesting: tramadol 50mg   Contract: 01/06/23 UDS: 01/06/23 Last Visit: 01/06/23 Next Visit: None Last Refill: 12/06/22 #60 and 0RF   Please Advise

## 2023-01-09 ENCOUNTER — Encounter: Payer: Self-pay | Admitting: Family Medicine

## 2023-01-09 ENCOUNTER — Encounter: Payer: Medicare Other | Admitting: Internal Medicine

## 2023-01-11 ENCOUNTER — Other Ambulatory Visit: Payer: Self-pay | Admitting: Internal Medicine

## 2023-01-11 DIAGNOSIS — I10 Essential (primary) hypertension: Secondary | ICD-10-CM

## 2023-01-11 DIAGNOSIS — E039 Hypothyroidism, unspecified: Secondary | ICD-10-CM

## 2023-01-13 ENCOUNTER — Other Ambulatory Visit (HOSPITAL_BASED_OUTPATIENT_CLINIC_OR_DEPARTMENT_OTHER): Payer: Self-pay

## 2023-01-13 ENCOUNTER — Other Ambulatory Visit: Payer: Self-pay

## 2023-01-13 DIAGNOSIS — E039 Hypothyroidism, unspecified: Secondary | ICD-10-CM

## 2023-01-13 MED ORDER — LEVOTHYROXINE SODIUM 100 MCG PO TABS
100.0000 ug | ORAL_TABLET | Freq: Every day | ORAL | 0 refills | Status: DC
Start: 2023-01-13 — End: 2023-04-15
  Filled 2023-01-13: qty 30, 30d supply, fill #0

## 2023-02-05 ENCOUNTER — Ambulatory Visit (INDEPENDENT_AMBULATORY_CARE_PROVIDER_SITE_OTHER): Payer: Medicare Other

## 2023-02-05 DIAGNOSIS — I442 Atrioventricular block, complete: Secondary | ICD-10-CM

## 2023-02-16 ENCOUNTER — Other Ambulatory Visit: Payer: Self-pay | Admitting: Family Medicine

## 2023-02-16 DIAGNOSIS — M545 Low back pain, unspecified: Secondary | ICD-10-CM

## 2023-02-16 DIAGNOSIS — M546 Pain in thoracic spine: Secondary | ICD-10-CM

## 2023-02-17 ENCOUNTER — Other Ambulatory Visit (HOSPITAL_BASED_OUTPATIENT_CLINIC_OR_DEPARTMENT_OTHER): Payer: Self-pay

## 2023-02-17 MED ORDER — TRAMADOL HCL 50 MG PO TABS
50.0000 mg | ORAL_TABLET | Freq: Four times a day (QID) | ORAL | 0 refills | Status: AC | PRN
Start: 2023-02-17 — End: ?
  Filled 2023-02-17: qty 60, 8d supply, fill #0

## 2023-02-21 NOTE — Progress Notes (Signed)
Remote pacemaker transmission.   

## 2023-02-28 ENCOUNTER — Telehealth: Payer: Self-pay

## 2023-02-28 NOTE — Telephone Encounter (Signed)
Returned pt's call regarding coming in to purchase compression hose. Asked her to call us back when she is able to.

## 2023-03-11 ENCOUNTER — Encounter: Payer: Self-pay | Admitting: Family Medicine

## 2023-03-11 ENCOUNTER — Ambulatory Visit (INDEPENDENT_AMBULATORY_CARE_PROVIDER_SITE_OTHER): Payer: Medicare Other | Admitting: Family Medicine

## 2023-03-11 VITALS — BP 132/66 | HR 75 | Resp 18 | Ht 63.0 in | Wt 149.6 lb

## 2023-03-11 DIAGNOSIS — Z23 Encounter for immunization: Secondary | ICD-10-CM

## 2023-03-11 DIAGNOSIS — R350 Frequency of micturition: Secondary | ICD-10-CM | POA: Diagnosis not present

## 2023-03-11 LAB — POCT URINALYSIS DIP (MANUAL ENTRY)
Bilirubin, UA: NEGATIVE
Blood, UA: NEGATIVE
Glucose, UA: NEGATIVE mg/dL
Ketones, POC UA: NEGATIVE mg/dL
Nitrite, UA: NEGATIVE
Protein Ur, POC: NEGATIVE mg/dL
Spec Grav, UA: 1.01 (ref 1.010–1.025)
Urobilinogen, UA: 0.2 U/dL
pH, UA: 6 (ref 5.0–8.0)

## 2023-03-11 MED ORDER — CEPHALEXIN 500 MG PO CAPS: 500.0000 mg | ORAL_CAPSULE | Freq: Two times a day (BID) | ORAL | 0 refills | Status: DC

## 2023-03-11 NOTE — Assessment & Plan Note (Signed)
Ua tr leuk Culture pending Printed rx keflex bid === pt will hold off on taking the abx unless symptoms worsened

## 2023-03-11 NOTE — Progress Notes (Signed)
Established Patient Office Visit  Subjective   Patient ID: [name], female    DOB: Nov 17, 1940  Age: 82 y.o. MRN: [id number]  Chief Complaint  Patient presents with   Urinary Tract Infection    Onset "the beginning of June" Frequency, urgency, pressure     HPI Discussed the use of AI scribe software for clinical note transcription with the patient, who gave verbal consent to proceed.  History of Present Illness   The patient, with a history of back pain, presents with urinary frequency and pressure. She reports that these symptoms began in June, coinciding with the onset of their back pain. The symptoms initially resolved but have since returned. She describes the sensation as pressure rather than pain, located in the lower abdomen. She denies dysuria and incontinence.  The patient's back pain has improved with physical therapy over the past four to five weeks, but she still experiences some discomfort and limitations in activity. She reports a recent improvement in her back pain over the past three days. She also mentions a balance issue, which her physical therapist attributes to a discrepancy in leg length due to muscle atrophy.      Patient Active Problem List   Diagnosis Date Noted   Frequency of urination 03/11/2023   Need for influenza vaccination 03/11/2023   Degenerative disc disease, lumbar 01/06/2023   Neck pain 11/08/2022   Degenerative disc disease, cervical 11/08/2022   Acute bilateral low back pain with bilateral sciatica 04/25/2022   History of vertebral compression fracture 04/25/2022   Non-recurrent acute serous otitis media of right ear 09/02/2021   Chronic diastolic heart failure (HCC) 07/04/2021   Aortic ectasia, unspecified site (HCC) 07/04/2021   SOB (shortness of breath) 06/15/2021   Pulmonary nodule 06/15/2021   Hypokalemia 10/02/2020   Neuropathy 10/02/2020   Elevated serum creatinine 10/02/2020   AKI (acute kidney injury) (HCC) 09/15/2020    Left-sided chest pain    Congestion of nasal sinus    Substernal chest pain 09/13/2020   Spider veins 04/05/2020   Varicose veins of bilateral lower extremities with other complications 04/05/2020   H/O seasonal allergies 04/05/2020   Numbness and tingling of both feet 12/23/2019   Paroxysmal atrial fibrillation (HCC) 07/22/2019   V-tach (HCC) 07/22/2019   Lipoma of left upper extremity 03/11/2019   Hordeolum externum of left upper eyelid 02/15/2019   Hyponatremia 02/15/2019   Hyperlipidemia 06/03/2017   Renal mass 03/20/2016   Fecal impaction (HCC) 10/16/2013   Vertebral compression fracture (HCC) 10/16/2013   Constipation 10/15/2013   Abdominal pain 10/15/2013   PUD (peptic ulcer disease) 10/15/2013   Low back pain with radiation 10/15/2013   Hypothyroidism, 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 08/2011 Upgrade of PPM to MDT Adapta L Dual Chamber PPM ser # [id number] H.   Cardiac pacemaker in situ    Contact dermatitis and eczema    unspec cause  Dairy product intolerance    Depression    Diastolic dysfunction    GERD (gastroesophageal reflux disease)    Hiatal hernia    History of diverticulitis of colon    2003  perforated diverticulitis w/ surgical intervention   History of kidney stones    age 33   History of peripheral edema    lower extremities    History of thyroid nodule    multinodular goiter  s/p  total thyroidectomy 2014   HTN (hypertension)    Hydronephrosis, left    Hypothyroidism    Mild sleep apnea    per study 04-14-2014   Nocturia    NSVT (nonsustained ventricular tachycardia) (HCC)    Osteoporosis    PAF (paroxysmal atrial fibrillation) (HCC)    PONV (postoperative nausea and vomiting)    Pulmonary nodules    stable per last ct   Renal cell carcinoma of left kidney (HCC)    Superficial thrombophlebitis    Past Surgical History:  Procedure Laterality Date   CARDIOVASCULAR STRESS TEST  04-20-2015  dr Graciela Husbands   normal nuclear study/  no ischemia/  normal LV function and wall motion, ef 74%   COLONOSCOPY  01/19/2013   COLOSTOMY TAKEDOWN  07-22-2002   CYSTOSCOPY WITH RETROGRADE PYELOGRAM, URETEROSCOPY AND STENT PLACEMENT Left 07/28/2015   Procedure: CYSTOSCOPY WITH LEFT RETROGRADE PYELOGRAM, POSSIBLE LEFT URETEROSCOPY AND STENT PLACEMENT;  Surgeon: Marcine Matar, MD;  Location[facility];  Service: Urology;  Laterality: Left;   EXPLORATORY LAPARTOMY SIGMOID COLECTOMY/ COLOSTOMY  04-28-2002   perforated diverticulitis   KYPHOPLASTY     LAPAROSCOPY VENTRAL HERNIA REPAIR/  EXTENSIVE LYSIS ADHESIONS  05-04-2004   PACEMAKER GENERATOR CHANGE Left 09/16/2011   MDT ADDRL1 pacemaker   PACEMAKER PLACEMENT  05-11-2003     medtronic   ROBOT ASSISTED LAPAROSCOPIC NEPHRECTOMY Left 03/20/2016   Procedure: XI ROBOTIC ASSISTED LAPAROSCOPIC RETROPERITONEAL NEPHRECTOMY;  Surgeon: Sebastian Ache, MD;  Location: [facility];  Service: Urology;  Laterality: Left;   THYROIDECTOMY N/A 08/07/2012   Procedure: THYROIDECTOMY;  Surgeon: Velora Heckler, MD;  Location: [facility];  Service: General;  Laterality: N/A;   TRANSTHORACIC ECHOCARDIOGRAM  10-12-2014   moderate focal basal LVH,  ef 55-60%,  grade 2 diastolic dysfunction, mild AV calcification without stenosis,  mild MR,  trivial PR   Social History   Tobacco Use   Smoking  status: Former    Current packs/day: 0.00    Types: Cigarettes    Start date: 10/05/1964    Quit date: 10/06/1979    Years since quitting: 43.4   Smokeless tobacco: Never  Vaping Use   Vaping status: Never Used  Substance Use Topics   Alcohol use: No   Drug use: No   Social History   Socioeconomic History   Marital status: Single    Spouse name: Not on file   Number of children: 2   Years of education: 16   Highest education level: Not on file  Occupational History   Occupation: Retired Nurse, adult for the [employer]    Employer: RETIRED  Tobacco Use   Smoking status: Former    Current packs/day: 0.00    Types: Cigarettes    Start date: 10/05/1964    Quit date: 10/06/1979    Years since quitting: 43.4   Smokeless tobacco: Never  Vaping Use   Vaping status: Never Used  Substance and Sexual Activity   Alcohol use: No   Drug use: No   Sexual activity: Not  on file  Other Topics Concern   Not on file  Social History Narrative   The patient is a Buyer, retail of [employer].  She worked for the [employer] in [employer].  She was married for 22 years, divorced, and has remained single. She has 1 grown son, 1 grown daughter.  She has 3 grandchildren, 2 of her children   live in [place].  She is retired.  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 She had a long term companion, 32 years, who passed away 28-Mar-2011.   Social Determinants of Health   Financial Resource Strain: Low Risk  (03/21/2021)   Overall Financial Resource Strain (CARDIA)    Difficulty of Paying Living Expenses: Not hard at all  Food Insecurity: No Food Insecurity (03/08/2022)   Hunger Vital Sign    Worried About Running Out of Food in the Last Year: Never true    Ran Out of Food in the Last Year: Never true  Transportation Needs: No Transportation Needs (03/08/2022)   PRAPARE - Administrator, Civil Service (Medical): No    Lack of Transportation  (Non-Medical): No  Physical Activity: Insufficiently Active (03/21/2021)   Exercise Vital Sign    Days of Exercise per Week: 4 days    Minutes of Exercise per Session: 30 min  Stress: No Stress Concern Present (03/21/2021)   [employer] of Occupational Health - Occupational Stress Questionnaire    Feeling of Stress : Not at all  Social Connections: Socially Isolated (03/21/2021)   Social Connection and Isolation Panel [NHANES]    Frequency of Communication with Friends and Family: More than three times a week    Frequency of Social Gatherings with Friends and Family: More than three times a week    Attends Religious Services: Never    Database administrator or Organizations: No    Attends Banker Meetings: Never    Marital Status: Divorced  Catering manager Violence: Not At Risk (03/21/2021)   Humiliation, Afraid, Rape, and Kick questionnaire    Fear of Current or Ex-Partner: No    Emotionally Abused: No    Physically Abused: No    Sexually Abused: No   Family Status  Relation Name Status   Mother  Deceased   Father  Deceased   Sister  (Not Specified)   Brother  Deceased at age 30       MI   Brother  (Not Specified)   Son  (Not Specified)   Neg Hx  (Not Specified)  No partnership data on file   Family History  Problem Relation Age of Onset   Heart disease Mother        Stent in her 92s   Stroke Mother    Hypertension Mother    Heart failure Father    Diverticulosis Sister    Hypertension Brother    Heart disease Brother    Hypertension Brother    Heart attack Neg Hx    Allergies  Allergen Reactions   Tape Rash   Codeine Nausea And Vomiting      ROS    Objective:     BP 132/66 (BP Location: Left Arm, Patient Position: Sitting, Cuff Size: Normal)   Pulse 75   Resp 18   Ht 5\' 3"  (1.6 m)   Wt 149 lb 9.6 oz (67.9 kg)   SpO2 97%   BMI 26.50 kg/m  BP Readings from Last  3 Encounters:  03/11/23 132/66  01/06/23 138/78  11/22/22 128/72    Wt Readings from Last 3 Encounters:  03/11/23 149 lb 9.6 oz (67.9 kg)  01/06/23 153 lb 6.4 oz (69.6 kg)  11/22/22 152 lb 6.4 oz (69.1 kg)   SpO2 Readings from Last 3 Encounters:  03/11/23 97%  01/06/23 94%  11/22/22 99%      Physical Exam   Results for orders placed or performed in visit on 03/11/23  POCT urinalysis dipstick  Result Value Ref Range   Color, UA yellow yellow   Clarity, UA cloudy (A) clear   Glucose, UA negative negative mg/dL   Bilirubin, UA negative negative   Ketones, POC UA negative negative mg/dL   Spec Grav, UA 1.610 9.604 - 1.025   Blood, UA negative negative   pH, UA 6.0 5.0 - 8.0   Protein Ur, POC negative negative mg/dL   Urobilinogen, UA 0.2 0.2 or 1.0 E.U./dL   Nitrite, UA Negative Negative   Leukocytes, UA Trace (A) Negative    Last CBC Lab Results  Component Value Date   WBC 5.4 01/06/2023   HGB 12.6 01/06/2023   HCT 39.2 01/06/2023   MCV 83.9 01/06/2023   MCH 27.5 06/06/2021   RDW 13.4 01/06/2023   PLT 177.0 01/06/2023   Last metabolic panel Lab Results  Component Value Date   GLUCOSE 90 01/06/2023   NA 134 (L) 01/06/2023   K 4.6 01/06/2023   CL 96 01/06/2023   CO2 29 01/06/2023   BUN 33 (H) 01/06/2023   CREATININE 1.02 01/06/2023   GFR 50.55 (L) 01/06/2023   CALCIUM 10.1 01/06/2023   PROT 7.0 01/06/2023   ALBUMIN 4.6 01/06/2023   BILITOT 0.5 01/06/2023   ALKPHOS 61 01/06/2023   AST 24 01/06/2023   ALT 16 01/06/2023   ANIONGAP 11 06/06/2021   Last lipids Lab Results  Component Value Date   CHOL 172 01/06/2023   HDL 47.20 01/06/2023   LDLCALC 107 (H) 01/06/2023   TRIG 89.0 01/06/2023   CHOLHDL 4 01/06/2023   Last hemoglobin A1c Lab Results  Component Value Date   HGBA1C 5.5 06/07/2016   Last thyroid functions Lab Results  Component Value Date   TSH 4.19 01/06/2023   T4TOTAL 12.3 03/10/2012   Last vitamin D Lab Results  Component Value Date   VD25OH 83.86 11/22/2021   Last vitamin B12 and  Folate Lab Results  Component Value Date   VITAMINB12 517 12/11/2020      The ASCVD Risk score (Arnett DK, et al., 2019) failed to calculate for the following reasons:   The 2019 ASCVD risk score is only valid for ages 34 to 73    Assessment & Plan:   Problem List Items Addressed This Visit       Unprioritized   Need for influenza vaccination   Relevant Orders   Flu Vaccine Trivalent High Dose (Fluad) (Completed)   Frequency of urination - Primary    Ua tr leuk Culture pending Printed rx keflex bid === pt will hold off on taking the abx unless symptoms worsened       Relevant Medications   cephALEXin (KEFLEX) 500 MG capsule   Other Relevant Orders   Urine Culture   POCT urinalysis dipstick (Completed)    No follow-ups on file.    Donato Schultz, DO

## 2023-03-12 DIAGNOSIS — M7989 Other specified soft tissue disorders: Secondary | ICD-10-CM

## 2023-03-13 ENCOUNTER — Encounter: Payer: Self-pay | Admitting: Family Medicine

## 2023-03-13 LAB — URINE CULTURE
MICRO NUMBER:: 15445734
Result:: NO GROWTH
SPECIMEN QUALITY:: ADEQUATE

## 2023-03-23 ENCOUNTER — Encounter: Payer: Self-pay | Admitting: Family Medicine

## 2023-03-23 DIAGNOSIS — R350 Frequency of micturition: Secondary | ICD-10-CM

## 2023-03-31 ENCOUNTER — Other Ambulatory Visit: Payer: Self-pay | Admitting: Family Medicine

## 2023-03-31 ENCOUNTER — Other Ambulatory Visit (HOSPITAL_BASED_OUTPATIENT_CLINIC_OR_DEPARTMENT_OTHER): Payer: Self-pay

## 2023-03-31 ENCOUNTER — Encounter: Payer: Self-pay | Admitting: Family Medicine

## 2023-03-31 DIAGNOSIS — M545 Low back pain, unspecified: Secondary | ICD-10-CM

## 2023-03-31 DIAGNOSIS — I10 Essential (primary) hypertension: Secondary | ICD-10-CM

## 2023-03-31 DIAGNOSIS — M546 Pain in thoracic spine: Secondary | ICD-10-CM

## 2023-03-31 MED ORDER — TRAMADOL HCL 50 MG PO TABS
50.0000 mg | ORAL_TABLET | Freq: Four times a day (QID) | ORAL | 0 refills | Status: DC | PRN
Start: 2023-03-31 — End: 2023-05-19
  Filled 2023-04-07: qty 60, 8d supply, fill #0

## 2023-03-31 MED ORDER — LISINOPRIL 40 MG PO TABS
40.0000 mg | ORAL_TABLET | Freq: Every day | ORAL | 1 refills | Status: DC
Start: 2023-03-31 — End: 2023-09-20
  Filled 2023-03-31 – 2023-04-07 (×2): qty 90, 90d supply, fill #0
  Filled 2023-07-08: qty 90, 90d supply, fill #1

## 2023-03-31 NOTE — Telephone Encounter (Signed)
Requesting: tramadol 50mg   Contract: None UDS:  None Last Visit: 03/11/23 Next Visit: None Last Refill: 02/17/23 #60 and 0RF   Please Advise

## 2023-04-01 ENCOUNTER — Ambulatory Visit: Payer: Medicare Other | Admitting: Physician Assistant

## 2023-04-01 ENCOUNTER — Encounter: Payer: Self-pay | Admitting: Physician Assistant

## 2023-04-01 DIAGNOSIS — M5416 Radiculopathy, lumbar region: Secondary | ICD-10-CM

## 2023-04-01 DIAGNOSIS — M5441 Lumbago with sciatica, right side: Secondary | ICD-10-CM

## 2023-04-01 DIAGNOSIS — G8929 Other chronic pain: Secondary | ICD-10-CM

## 2023-04-01 NOTE — Progress Notes (Signed)
Office Visit Note   Patient: [name]           Date of Birth: 05-14-1941           MRN: [id number] Visit Date: 04/01/2023              Requested by: [address] [place], [place] [address] [place],  [place] [address] PCP: Zola Button, Grayling Congress, DO   Assessment & Plan: Visit Diagnoses:  1. Chronic bilateral low back pain with right-sided sciatica   2. Radiculopathy, lumbar region     Plan: Pleasant 82 year old woman with a history of multiple fractures in her spine secondary to osteoporosis.  She has been on Prolia and has not had a fracture that she is known of since 2015.  She has had vertebroplasty's and kyphoplasty's.  She has had an ongoing new issue with her back which consists of low back pain radiating into her right buttock and down her leg.  Does not go below her knee.  She does have some paresthesias in both of her feet.  Rates her pain as moderate and seems different than previous vertebral fractures.  She has a history of having 1 kidney removed and has a pacemaker.  She takes Tylenol and tramadol and a muscle relaxant.  She denies any loss of bowel or bladder control but rates her pain is moderate that has not gotten better since June.  She did try a course of physical therapy with helped a little bit but did not completely alleviate her symptoms.  Will go forward with a CT scan with expectation of may be managed nonoperatively with Dr. Alvester Morin  Follow-Up Instructions: No follow-ups on file.   Orders:  Orders Placed This Encounter  Procedures   CT LUMBAR SPINE WO CONTRAST   No orders of the defined types were placed in this encounter.     Procedures: No procedures performed   Clinical Data: No additional findings.   Subjective: Chief Complaint  Patient presents with   Lower Back - Pain    HPI patient is a pleasant 82 year old woman with a history of multiple kyphoplasty vertebroplasty secondary to insufficiency fractures.  She is on Prolia  and has been on it and has not had a fracture since 2015.  She comes in today with a 50-month history of right lower back pain that radiates into her right leg.  Does not go below her knee denies any loss of bowel or bladder control.  She has tried physical therapy without relief in her symptoms.  She cannot take anti-inflammatories because she only has a single kidney.  Tylenol tramadol and muscle relaxants have helped a little bit.  Was referred to for further treatment  Review of Systems  All other systems reviewed and are negative.    Objective: Vital Signs: There were no vitals taken for this visit.  Physical Exam Constitutional:      Appearance: Normal appearance.  Pulmonary:     Effort: Pulmonary effort is normal.  Skin:    General: Skin is warm and dry.  Neurological:     General: No focal deficit present.     Mental Status: She is alert and oriented to person, place, and time.  Psychiatric:        Mood and Affect: Mood normal.     Ortho Exam  Specialty Comments:  No specialty comments available.  Imaging: No results found.   PMFS History: Patient Active Problem List  Diagnosis Date Noted   Radiculopathy, lumbar region 04/01/2023   Frequency of urination 03/11/2023   Need for influenza vaccination 03/11/2023   Degenerative disc disease, lumbar 01/06/2023   Neck pain 11/08/2022   Degenerative disc disease, cervical 11/08/2022   Acute bilateral low back pain with bilateral sciatica 04/25/2022   History of vertebral compression fracture 04/25/2022   Non-recurrent acute serous otitis media of right ear 09/02/2021   Chronic diastolic heart failure (HCC) 07/04/2021   Aortic ectasia, unspecified site (HCC) 07/04/2021   SOB (shortness of breath) 06/15/2021   Pulmonary nodule 06/15/2021   Hypokalemia 10/02/2020   Neuropathy 10/02/2020   Elevated serum creatinine 10/02/2020   AKI (acute kidney injury) (HCC) 09/15/2020   Left-sided chest pain    Congestion of nasal  sinus    Substernal chest pain 09/13/2020   Spider veins 04/05/2020   Varicose veins of bilateral lower extremities with other complications 04/05/2020   H/O seasonal allergies 04/05/2020   Numbness and tingling of both feet 12/23/2019   Paroxysmal atrial fibrillation (HCC) 07/22/2019   V-tach (HCC) 07/22/2019   Lipoma of left upper extremity 03/11/2019   Hordeolum externum of left upper eyelid 02/15/2019   Hyponatremia 02/15/2019   Hyperlipidemia 06/03/2017   Renal mass 03/20/2016   Fecal impaction (HCC) 10/16/2013   Vertebral compression fracture (HCC) 10/16/2013   Constipation 10/15/2013   Abdominal pain 10/15/2013   PUD (peptic ulcer disease) 10/15/2013   Low back pain with radiation 10/15/2013   Hypothyroidism, 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 08/2011 Upgrade of PPM to MDT Adapta L Dual Chamber PPM ser # [id number] H.   Cardiac pacemaker in situ    Contact dermatitis and eczema    unspec cause   Dairy product intolerance    Depression    Diastolic dysfunction    GERD (gastroesophageal reflux disease)    Hiatal hernia    History of diverticulitis of colon    2003  perforated diverticulitis w/ surgical intervention   History of kidney stones    age 31   History of peripheral edema    lower extremities   History of thyroid nodule    multinodular  goiter  s/p  total thyroidectomy 2014   HTN (hypertension)    Hydronephrosis, left    Hypothyroidism    Mild sleep apnea    per study 04-14-2014   Nocturia    NSVT (nonsustained ventricular tachycardia) (HCC)    Osteoporosis    PAF (paroxysmal atrial fibrillation) (HCC)    PONV (postoperative nausea and vomiting)    Pulmonary nodules    stable per last ct   Renal cell carcinoma of left kidney (HCC)    Superficial thrombophlebitis     Family History  Problem Relation Age of Onset   Heart disease Mother        Stent in her 90s   Stroke Mother    Hypertension Mother    Heart  failure Father    Diverticulosis Sister    Hypertension Brother    Heart disease Brother    Hypertension Brother    Heart attack Neg Hx     Past Surgical History:  Procedure Laterality Date   CARDIOVASCULAR STRESS TEST  04-20-2015  dr Graciela Husbands   normal nuclear study/  no ischemia/  normal LV function and wall motion, ef 74%   COLONOSCOPY  01/19/2013   COLOSTOMY TAKEDOWN  07-22-2002   CYSTOSCOPY WITH RETROGRADE PYELOGRAM, URETEROSCOPY AND STENT PLACEMENT Left 07/28/2015   Procedure: CYSTOSCOPY WITH LEFT RETROGRADE PYELOGRAM, POSSIBLE LEFT URETEROSCOPY AND STENT PLACEMENT;  Surgeon: Marcine Matar, MD;  Location: [facility];  Service: Urology;  Laterality: Left;   EXPLORATORY LAPARTOMY SIGMOID COLECTOMY/ COLOSTOMY  04-28-2002   perforated diverticulitis   KYPHOPLASTY     LAPAROSCOPY VENTRAL HERNIA REPAIR/  EXTENSIVE LYSIS ADHESIONS  05-04-2004   PACEMAKER GENERATOR CHANGE Left 09/16/2011   MDT ADDRL1 pacemaker   PACEMAKER PLACEMENT  05-11-2003     medtronic   ROBOT ASSISTED LAPAROSCOPIC NEPHRECTOMY Left 03/20/2016   Procedure: XI ROBOTIC ASSISTED LAPAROSCOPIC RETROPERITONEAL NEPHRECTOMY;  Surgeon: Sebastian Ache, MD;  Location: [facility];  Service: Urology;  Laterality: Left;   THYROIDECTOMY N/A 08/07/2012   Procedure: THYROIDECTOMY;  Surgeon: Velora Heckler, MD;  Location: [facility];  Service:  General;  Laterality: N/A;   TRANSTHORACIC ECHOCARDIOGRAM  10-12-2014   moderate focal basal LVH,  ef 55-60%,  grade 2 diastolic dysfunction, mild AV calcification without stenosis,  mild MR,  trivial PR   Social History   Occupational History   Occupation: Retired Nurse, adult for the [employer]    Employer: RETIRED  Tobacco Use   Smoking status: Former    Current packs/day: 0.00    Types: Cigarettes    Start date: 10/05/1964    Quit date: 10/06/1979    Years since quitting: 43.5   Smokeless tobacco: Never  Vaping Use   Vaping status: Never Used  Substance and Sexual Activity   Alcohol use: No   Drug use: No   Sexual activity: Not on file

## 2023-04-07 ENCOUNTER — Other Ambulatory Visit (HOSPITAL_BASED_OUTPATIENT_CLINIC_OR_DEPARTMENT_OTHER): Payer: Self-pay

## 2023-04-12 ENCOUNTER — Other Ambulatory Visit: Payer: Self-pay | Admitting: Family Medicine

## 2023-04-12 DIAGNOSIS — Z889 Allergy status to unspecified drugs, medicaments and biological substances status: Secondary | ICD-10-CM

## 2023-04-14 ENCOUNTER — Other Ambulatory Visit (HOSPITAL_BASED_OUTPATIENT_CLINIC_OR_DEPARTMENT_OTHER): Payer: Self-pay

## 2023-04-14 ENCOUNTER — Other Ambulatory Visit: Payer: Self-pay | Admitting: Internal Medicine

## 2023-04-14 DIAGNOSIS — E039 Hypothyroidism, unspecified: Secondary | ICD-10-CM

## 2023-04-14 MED ORDER — LORATADINE 10 MG PO TABS
10.0000 mg | ORAL_TABLET | Freq: Every day | ORAL | 0 refills | Status: DC | PRN
  Filled 2023-04-14: qty 100, 100d supply, fill #0

## 2023-04-15 ENCOUNTER — Encounter: Payer: Self-pay | Admitting: Obstetrics

## 2023-04-15 ENCOUNTER — Telehealth: Payer: Self-pay

## 2023-04-15 ENCOUNTER — Ambulatory Visit (INDEPENDENT_AMBULATORY_CARE_PROVIDER_SITE_OTHER): Payer: Medicare Other | Admitting: Obstetrics

## 2023-04-15 VITALS — BP 127/80 | HR 101 | Ht 62.99 in | Wt 145.0 lb

## 2023-04-15 DIAGNOSIS — R351 Nocturia: Secondary | ICD-10-CM | POA: Diagnosis not present

## 2023-04-15 DIAGNOSIS — K5900 Constipation, unspecified: Secondary | ICD-10-CM

## 2023-04-15 DIAGNOSIS — R102 Pelvic and perineal pain unspecified side: Secondary | ICD-10-CM

## 2023-04-15 DIAGNOSIS — R3914 Feeling of incomplete bladder emptying: Secondary | ICD-10-CM | POA: Diagnosis not present

## 2023-04-15 LAB — POCT URINALYSIS DIPSTICK
Bilirubin, UA: NEGATIVE
Blood, UA: NEGATIVE
Glucose, UA: NEGATIVE
Ketones, UA: NEGATIVE
Leukocytes, UA: NEGATIVE
Nitrite, UA: NEGATIVE
Protein, UA: NEGATIVE
Spec Grav, UA: 1.01 (ref 1.010–1.025)
Urobilinogen, UA: 0.2 U/dL
pH, UA: 7.5 (ref 5.0–8.0)

## 2023-04-15 MED ORDER — GEMTESA 75 MG PO TABS: 75.0000 mg | ORAL_TABLET | Freq: Every day | ORAL | Status: DC

## 2023-04-15 MED ORDER — GEMTESA 75 MG PO TABS
75.0000 mg | ORAL_TABLET | Freq: Every day | ORAL | 2 refills | Status: DC
Start: 2023-04-15 — End: 2023-05-27

## 2023-04-15 NOTE — Telephone Encounter (Signed)
Noted  

## 2023-04-15 NOTE — Telephone Encounter (Signed)
Pt needs to be seen for more refills.

## 2023-04-15 NOTE — Patient Instructions (Addendum)
Constipation: Our goal is to achieve formed bowel movements daily or every-other-day.  You may need to try different combinations of the following options to find what works best for you - everybody's body works differently so feel free to adjust the dosages as needed.  Some options to help maintain bowel health include:  Dietary changes (more leafy greens, vegetables and fruits; less processed foods) Fiber supplementation (Benefiber, FiberCon, Metamucil or Psyllium). Start slow and increase gradually to full dose. Over-the-counter agents such as: stool softeners (Docusate or Colace) and/or laxatives (Miralax, milk of magnesia)  "Power Pudding" is a natural mixture that may help your constipation.  To make blend 1 cup applesauce, 1 cup wheat bran, and 3/4 cup prune juice, refrigerate and then take 1 tablespoon daily with a large glass of water as needed.  Women should try to eat at least 21 to 25 grams of fiber a day, while men should aim for 30 to 38 grams a day. You can add fiber to your diet with food or a fiber supplement such as psyllium (metamucil), benefiber, or fibercon.   Here's a look at how much dietary fiber is found in some common foods. When buying packaged foods, check the Nutrition Facts label for fiber content. It can vary among brands.  Fruits Serving size Total fiber (grams)*  Raspberries 1 cup 8.0  Pear 1 medium 5.5  Apple, with skin 1 medium 4.5  Banana 1 medium 3.0  Orange 1 medium 3.0  Strawberries 1 cup 3.0   Vegetables Serving size Total fiber (grams)*  Green peas, boiled 1 cup 9.0  Broccoli, boiled 1 cup chopped 5.0  Turnip greens, boiled 1 cup 5.0  Brussels sprouts, boiled 1 cup 4.0  Potato, with skin, baked 1 medium 4.0  Sweet corn, boiled 1 cup 3.5  Cauliflower, raw 1 cup chopped 2.0  Carrot, raw 1 medium 1.5   Grains Serving size Total fiber (grams)*  Spaghetti, whole-wheat, cooked 1 cup 6.0  Barley, pearled, cooked 1 cup 6.0  Bran flakes 3/4 cup 5.5   Quinoa, cooked 1 cup 5.0  Oat bran muffin 1 medium 5.0  Oatmeal, instant, cooked 1 cup 5.0  Popcorn, air-popped 3 cups 3.5  Brown rice, cooked 1 cup 3.5  Bread, whole-wheat 1 slice 2.0  Bread, rye 1 slice 2.0   Legumes, nuts and seeds Serving size Total fiber (grams)*  Split peas, boiled 1 cup 16.0  Lentils, boiled 1 cup 15.5  Black beans, boiled 1 cup 15.0  Baked beans, canned 1 cup 10.0  Chia seeds 1 ounce 10.0  Almonds 1 ounce (23 nuts) 3.5  Pistachios 1 ounce (49 nuts) 3.0  Sunflower kernels 1 ounce 3.0  *Rounded to nearest 0.5 gram. Source: [employer] for [employer]	[place]    We discussed the symptoms of overactive bladder (OAB), which include urinary urgency, urinary frequency, night-time urination, with or without urge incontinence.  We discussed management including behavioral therapy (decreasing bladder irritants by following a bladder diet, urge suppression strategies, timed voids, bladder retraining), physical therapy, medication; and for refractory cases posterior tibial nerve stimulation, sacral neuromodulation, and intravesical botulinum toxin injection.   For Beta-3 agonist medication, we discussed the potential side effect of elevated blood pressure which is more likely to occur in individuals with uncontrolled hypertension. You were given samples for Gemtesa 75 mg.  It can take a month to start working so give it time, but if you have bothersome side effects call sooner and we can  try a different medication.  Call us if you have trouble filling the prescription or if it's not covered by your insurance.   For night time frequency: - avoid fluid intake after 6pm - elevated your feet during the day and continue use compression socks to reduce lower extremity swelling - switch your diuretic (e.g. HCTZ) dosing to 2pm - consider CPAP machine for history of sleep apnea

## 2023-04-15 NOTE — Progress Notes (Unsigned)
New Patient Evaluation and Consultation  Referring Provider: Zola Button, Grayling Congress, * PCP: Zola Button, Grayling Congress, DO Date of Service: 04/15/2023  SUBJECTIVE Chief Complaint: New Patient (Initial Visit) [name] is a 82 y.o. female here for a consult for pain in the lower abdomen and frequent urination./)  History of Present Illness: [name] is a 82 y.o.  female seen in consultation at the request of Dr Zola Button for evaluation of urinary frequency.   Initially started with change in urine stream to spraying prior to 2001, underwent testing (possible simple CMG) prior to move from urology in [place] with incomplete bladder emptying noted. Denies any medical or surgical intervention.   History of multiple compression spinal fractures known since 2015 due to osteoporosis manage by [place] [facility], [place] (orthopedics), worsening right buttock and leg pain with bilateral paresthesia in her feet. Tried PT, managed by Dr. Alvester Morin and pending CT lumbar spine without contrast. History of left hydronephrosis, cysto with left retrograde pyelogram and stent placement by Dr. Retta Diones in 2017. S/p left nephrectomy for renal cell carcinoma by Dr. Berneice Heinrich in 2017, pt denies diagnosis of cancer and was told it could have been congenital. Diagnosed from incidental finding on pulmonary module scan. Denies urology follow-up since 2017.  Reports increased urinary frequency around 3 months ago along after onset of worsening back pain, baseline daytime frequency 3x/day, 2-3x/night.  Review of records significant for: V-tach s/p pacemaker placement in 2013, bilateral lower back pain and sciatica with compression fracture  Urinary Symptoms: Does not leak urine.   Day time voids 5-8.  Nocturia: 2-3 times per night to void with history of sleep apnea, peripheral edema managed with compression socks and chronic diastolic heart failure Voiding dysfunction:  does not empty bladder well.  Patient does not  use a catheter to empty bladder.  When urinating, patient feels to push on her belly or vagina to empty bladder Drinks: 84oz per day  UTIs:  0  UTI's in the last year.   Denies history of kidney or bladder stones and kidney cancer No results found for the last 90 days.   Pelvic Organ Prolapse Symptoms:                  Patient Denies a feeling of a bulge the vaginal area.  Bowel Symptom: Bowel movements: 3-4 time(s) per week with history of constipation, fecal impaction, and ex-lap for perforated diverticulitis in 09/15/01 with ostomy reversal after 3 months Stool consistency: soft  Straining: yes.  Splinting: yes.  Incomplete evacuation: yes.  Patient Denies accidental bowel leakage / fecal incontinence Bowel regimen: fiber benefiber Last colonoscopy: Date 2014, Results normal per patient  Sexual Function Sexually active: no.   Pelvic Pain Denies pelvic pain  Past Medical History:  Past Medical History:  Diagnosis Date   Atrioventricular block, complete (HCC)    a. 08/2011 Upgrade of PPM to MDT Adapta L Dual Chamber PPM ser # [id number].   Cardiac pacemaker in situ    Contact dermatitis and eczema    unspec cause   Dairy product intolerance    Depression    Diastolic dysfunction    GERD (gastroesophageal reflux disease)    Hiatal hernia    History of diverticulitis of colon    2003  perforated diverticulitis w/ surgical intervention   History of kidney stones    age 89   History of peripheral edema    lower extremities   History of thyroid nodule  multinodular goiter  s/p  total thyroidectomy 2014   HTN (hypertension)    Hydronephrosis, left    Hypothyroidism    Mild sleep apnea    per study 04-14-2014   Nocturia    NSVT (nonsustained ventricular tachycardia) (HCC)    Osteoporosis    PAF (paroxysmal atrial fibrillation) (HCC)    PONV (postoperative nausea and vomiting)    Pulmonary nodules    stable per last ct   Renal cell carcinoma of left kidney (HCC)     Superficial thrombophlebitis      Past Surgical History:   Past Surgical History:  Procedure Laterality Date   CARDIOVASCULAR STRESS TEST  04-20-2015  dr Graciela Husbands   normal nuclear study/  no ischemia/  normal LV function and wall motion, ef 74%   COLONOSCOPY  01/19/2013   COLOSTOMY TAKEDOWN  07-22-2002   CYSTOSCOPY WITH RETROGRADE PYELOGRAM, URETEROSCOPY AND STENT PLACEMENT Left 07/28/2015   Procedure: CYSTOSCOPY WITH LEFT RETROGRADE PYELOGRAM, POSSIBLE LEFT URETEROSCOPY AND STENT PLACEMENT;  Surgeon: Marcine Matar, MD;  Location: [facility];  Service: Urology;  Laterality: Left;   EXPLORATORY LAPARTOMY SIGMOID COLECTOMY/ COLOSTOMY  04-28-2002   perforated diverticulitis   KYPHOPLASTY     LAPAROSCOPY VENTRAL HERNIA REPAIR/  EXTENSIVE LYSIS ADHESIONS  05-04-2004   PACEMAKER GENERATOR CHANGE Left 09/16/2011   MDT ADDRL1 pacemaker   PACEMAKER PLACEMENT  05-11-2003     medtronic   ROBOT ASSISTED LAPAROSCOPIC NEPHRECTOMY Left 03/20/2016   Procedure: XI ROBOTIC ASSISTED LAPAROSCOPIC RETROPERITONEAL NEPHRECTOMY;  Surgeon: Sebastian Ache, MD;  Location: [facility];  Service: Urology;  Laterality: Left;   THYROIDECTOMY N/A 08/07/2012   Procedure: THYROIDECTOMY;  Surgeon: Velora Heckler, MD;  Location: [facility];  Service: General;  Laterality: N/A;   TRANSTHORACIC ECHOCARDIOGRAM  10-12-2014   moderate focal basal LVH,  ef 55-60%,  grade 2 diastolic dysfunction, mild AV calcification without stenosis,  mild MR,  trivial PR     Past OB/GYN History: OB History  Gravida Para Term Preterm AB Living  3       1 2   SAB IAB Ectopic Multiple Live Births          2    # Outcome Date GA Lbr Len/2nd Weight Sex Type Anes PTL Lv  3 Gravida      Vag-Spont   LIV  2 Gravida      Vag-Spont   LIV  1 AB             Vaginal deliveries: 2, largest infant 8lb. Forceps/ Vacuum deliveries: 0, Cesarean section: 0 Menopausal: Yes, menopause in 54s Contraception: none. Last pap smear was 2013 with no  results available for review.  Any history of abnormal pap smears: no.  Medications: Patient has a current medication list which includes the following prescription(s): acetaminophen, amlodipine, apixaban, cephalexin, cyclobenzaprine, denosumab, diphenhydramine-acetaminophen, fluticasone, hydrochlorothiazide, levocetirizine, levothyroxine, lisinopril, loratadine, tramadol, gemtesa, and gemtesa, and the following Facility-Administered Medications: aminophylline.   Allergies: Patient is allergic to tape and codeine.   Social History:  Social History   Tobacco Use   Smoking status: Former    Current packs/day: 0.00    Types: Cigarettes    Start date: 10/05/1964    Quit date: 10/06/1979    Years since quitting: 43.5   Smokeless tobacco: Never  Vaping Use   Vaping status: Never Used  Substance Use Topics   Alcohol use: No   Drug use: No    Relationship status: widowed Patient lives by herself.   Patient  is not employed. Regular exercise: No History of abuse: No  Family History:   Family History  Problem Relation Age of Onset   Heart disease Mother        Stent in her 22s   Stroke Mother    Hypertension Mother    Heart failure Father    Diverticulosis Sister    Hypertension Brother    Heart disease Brother    Hypertension Brother    Heart attack Neg Hx      Review of Systems: Review of Systems  Constitutional:  Negative for fever, malaise/fatigue and weight loss.  Respiratory:  Negative for cough, shortness of breath and wheezing.   Cardiovascular:  Negative for chest pain, palpitations and leg swelling.  Gastrointestinal:  Negative for abdominal pain and blood in stool.  Genitourinary:  Positive for frequency. Negative for dysuria, flank pain and hematuria.  Skin:  Negative for rash.  Neurological:  Negative for dizziness, weakness and headaches.  Endo/Heme/Allergies:  Bruises/bleeds easily.  Psychiatric/Behavioral:  Negative for depression. The patient is not  nervous/anxious.      OBJECTIVE Physical Exam: Vitals:   04/15/23 1110  BP: 127/80  Pulse: (!) 101  Weight: 145 lb (65.8 kg)  Height: 5' 2.99" (1.6 m)    Physical Exam Vitals reviewed. Exam conducted with a chaperone present.  Constitutional:      General: She is not in acute distress.    Appearance: Normal appearance.  Cardiovascular:     Rate and Rhythm: Tachycardia present.  Pulmonary:     Effort: Pulmonary effort is normal. No respiratory distress.  Abdominal:     General: Abdomen is flat. There is no distension.     Palpations: Abdomen is soft. There is no mass.     Tenderness: There is no abdominal tenderness.     Hernia: No hernia is present.    Genitourinary:    Vagina: No vaginal discharge, erythema, tenderness, bleeding or lesions.     Cervix: No cervical motion tenderness, discharge, friability or lesion.     Uterus: Not enlarged, not fixed and not tender.      Adnexa:        Right: No mass, tenderness or fullness.         Left: No mass, tenderness or fullness.    Neurological:     Mental Status: She is alert.    Physical Exam Constitutional:      General: She is not in acute distress.    Appearance: Normal appearance.  Genitourinary:     Bladder and urethral meatus normal.     No lesions in the vagina.     Right Labia: No rash, tenderness, lesions, skin changes or Bartholin's cyst.    Left Labia: No tenderness, lesions, skin changes, Bartholin's cyst or rash.    No vaginal discharge, erythema, tenderness, bleeding, ulceration or granulation tissue.     Anterior and posterior vaginal prolapse present.    Moderate vaginal atrophy present.     Right Adnexa: not tender, not full and no mass present.    Left Adnexa: not tender, not full and no mass present.    No cervical motion tenderness, discharge, friability, lesion or polyp.     No parametrium nodularity present.    Uterus is not enlarged, fixed, tender, irregular or prolapsed.     No uterine  mass detected.    Uterus is anteverted.     No urethral prolapse, tenderness, mass or stress urinary incontinence with cough stress test present.  Bladder urgency on palpation not present and masses not present.      Pelvic Floor: Levator muscle strength is 3/5.    Levator ani is tender (Palpation of levator ani recreates sensation of lower abdominal pressure).     Obturator internus not tender, no asymmetrical contractions present and no pelvic spasms present.    Symmetrical pelvic sensation, anal wink present and BC reflex present. Cardiovascular:     Rate and Rhythm: Tachycardia present.  Pulmonary:     Effort: Pulmonary effort is normal. No respiratory distress.  Abdominal:     General: Abdomen is flat. There is no distension.     Palpations: Abdomen is soft. There is no mass.     Tenderness: There is no abdominal tenderness.     Hernia: No hernia is present.    Neurological:     Mental Status: She is alert.  Vitals reviewed. Exam conducted with a chaperone present.   POP-Q:   POP-Q  -2                                            Aa   -2                                           Ba  -7                                              C   1                                            Gh  3                                            Pb  8                                            tvl   -2                                            Ap  -2                                            Bp  -8                                              D     Post-Void Residual (PVR) by Bladder Scan: In order to evaluate  bladder emptying, we discussed obtaining a postvoid residual and patient agreed to this procedure.  Procedure: The ultrasound unit was placed on the patient's abdomen in the suprapubic region after the patient had voided.    Post Void Residual - 04/15/23 1122       Post Void Residual   Post Void Residual 9 mL            Straight Catheterization Procedure  for PVR: After verbal consent was obtained from the patient for catheterization to assess bladder emptying and residual volume the urethra and surrounding tissues were prepped with betadine and an in and out catheterization was performed.  PVR was .  Urine appeared clear yellow. The patient tolerated the procedure well.   Laboratory Results: Lab Results  Component Value Date   COLORU Yellow 04/15/2023   CLARITYU Clear 04/15/2023   GLUCOSEUR Negative 04/15/2023   BILIRUBINUR Negative 04/15/2023   KETONESU Negative 04/15/2023   SPECGRAV 1.010 04/15/2023   RBCUR Negative 04/15/2023   PHUR 7.5 04/15/2023   PROTEINUR Negative 04/15/2023   UROBILINOGEN 0.2 04/15/2023   LEUKOCYTESUR Negative 04/15/2023    Lab Results  Component Value Date   CREATININE 1.02 01/06/2023   CREATININE 1.20 08/27/2021   CREATININE 0.96 06/06/2021    Lab Results  Component Value Date   HGBA1C 5.5 06/07/2016    Lab Results  Component Value Date   HGB 12.6 01/06/2023     ASSESSMENT AND PLAN Ms. Reach is a 82 y.o. with:  1. Nocturia   2. Feeling of incomplete bladder emptying   3. Constipation, unspecified constipation type   4. Pelvic pain     Nocturia Assessment & Plan: For night time frequency: - avoid fluid intake after 6pm - elevated your feet during the day and continue use compression socks to reduce lower extremity swelling due to history of diastolic HF - switch your diuretic (e.g. furosemide) dosing to 2pm - encouraged to consider re-evaluation for CPAP with history of sleep apnea if persistent symptoms after fluid management or OAB medication   Orders: -     POCT urinalysis dipstick -     AMB referral to rehabilitation  Feeling of incomplete bladder emptying Assessment & Plan: - PVR 9mL via bladder scan - via catheterization due to prior unclear urology evaluation in [place] with incomplete bladder emptying - sensation of pelvic pressure recreated with palpation of  pelvic floor muscles - repeat if clinical change after starting gemtesa  Orders: -     AMB referral to rehabilitation  Constipation, unspecified constipation type Assessment & Plan: For constipation, we reviewed the importance of a better bowel regimen.  We also discussed the importance of avoiding chronic straining, as it can exacerbate her pelvic floor symptoms; we discussed treating constipation and straining prior to surgery, as postoperative straining can lead to damage to the repair and recurrence of symptoms. We discussed initiating therapy with increasing fluid intake, fiber supplementation, stool softeners, and laxatives such as miralax.    Orders: -     AMB referral to rehabilitation  Pelvic pain Assessment & Plan: - myofascial pain possibly associated with sensation of incomplete emptying, reproducible on exam with palpation of pelvic floor muscles bilaterally in the setting of constipation and compression fracture  - referral for pelvic floor PT - optimized stool consistency and discussed squatting position for pelvic relaxation during bowel movements.  Orders: -     AMB referral to rehabilitation  Other orders -     Gemtesa; Take 1  tablet (75 mg total) by mouth daily.  Dispense: 30 tablet; Refill: 2 -     Gemtesa; Take 1 tablet (75 mg total) by mouth daily.  Time spent: I spent 62 minutes dedicated to the care of this patient on the date of this encounter to include pre-visit review of records, face-to-face time with the patient discussing nocutira, constipation, pelvic floor myofascial pain and post visit documentation and ordering medication/ testing.   Loleta Chance, MD

## 2023-04-16 ENCOUNTER — Encounter: Payer: Self-pay | Admitting: Obstetrics

## 2023-04-16 ENCOUNTER — Ambulatory Visit
Admission: RE | Admit: 2023-04-16 | Discharge: 2023-04-16 | Disposition: A | Discharge status: Home / Self Care | Payer: Medicare Other | Source: Ambulatory Visit | Attending: Physician Assistant | Admitting: Physician Assistant

## 2023-04-16 DIAGNOSIS — G8929 Other chronic pain: Secondary | ICD-10-CM

## 2023-04-16 DIAGNOSIS — R102 Pelvic and perineal pain: Secondary | ICD-10-CM | POA: Insufficient documentation

## 2023-04-16 DIAGNOSIS — R351 Nocturia: Secondary | ICD-10-CM | POA: Insufficient documentation

## 2023-04-16 DIAGNOSIS — R3914 Feeling of incomplete bladder emptying: Secondary | ICD-10-CM | POA: Insufficient documentation

## 2023-04-16 NOTE — Assessment & Plan Note (Deleted)
-   encouraged to follow-up with orthopedic surgery due to possible association with pelvic floor and urinary symptoms due to timing of symptoms and recent exacerbation of both back pain and urinary symptoms.

## 2023-04-16 NOTE — Assessment & Plan Note (Addendum)
-   myofascial pain possibly associated with sensation of incomplete emptying, reproducible on exam with palpation of pelvic floor muscles bilaterally in the setting of constipation and compression fracture  - referral for pelvic floor PT - optimized stool consistency and discussed squatting position for pelvic relaxation during bowel movements.

## 2023-04-16 NOTE — Assessment & Plan Note (Addendum)
For night time frequency: - avoid fluid intake after 6pm - elevated your feet during the day and continue use compression socks to reduce lower extremity swelling due to history of diastolic HF - switch your diuretic (e.g. furosemide) dosing to 2pm - encouraged to consider re-evaluation for CPAP with history of sleep apnea if persistent symptoms after fluid management or OAB medication

## 2023-04-16 NOTE — Assessment & Plan Note (Signed)
For constipation, we reviewed the importance of a better bowel regimen.  We also discussed the importance of avoiding chronic straining, as it can exacerbate her pelvic floor symptoms; we discussed treating constipation and straining prior to surgery, as postoperative straining can lead to damage to the repair and recurrence of symptoms. We discussed initiating therapy with increasing fluid intake, fiber supplementation, stool softeners, and laxatives such as miralax.

## 2023-04-16 NOTE — Assessment & Plan Note (Signed)
-   PVR 9mL via bladder scan - via catheterization due to prior unclear urology evaluation in [place] with incomplete bladder emptying - sensation of pelvic pressure recreated with palpation of pelvic floor muscles - repeat if clinical change after starting gemtesa

## 2023-04-29 NOTE — Telephone Encounter (Signed)
Prolia VOB initiated via [email or web address]  Next Prolia inj DUE: 05/26/23

## 2023-05-03 ENCOUNTER — Other Ambulatory Visit: Payer: Self-pay | Admitting: Internal Medicine

## 2023-05-07 ENCOUNTER — Ambulatory Visit (INDEPENDENT_AMBULATORY_CARE_PROVIDER_SITE_OTHER): Payer: Medicare Other

## 2023-05-07 DIAGNOSIS — I442 Atrioventricular block, complete: Secondary | ICD-10-CM | POA: Diagnosis not present

## 2023-05-09 ENCOUNTER — Other Ambulatory Visit: Payer: Self-pay | Admitting: Family

## 2023-05-09 DIAGNOSIS — M546 Pain in thoracic spine: Secondary | ICD-10-CM

## 2023-05-09 DIAGNOSIS — M545 Low back pain, unspecified: Secondary | ICD-10-CM

## 2023-05-12 ENCOUNTER — Other Ambulatory Visit (HOSPITAL_BASED_OUTPATIENT_CLINIC_OR_DEPARTMENT_OTHER): Payer: Self-pay

## 2023-05-12 LAB — CUP PACEART REMOTE DEVICE CHECK
Battery Impedance: 4058 Ohm
Battery Remaining Longevity: 11 mo
Battery Voltage: 2.68 V
Brady Statistic AP VP Percent: 4 %
Brady Statistic AP VS Percent: 0 %
Brady Statistic AS VP Percent: 96 %
Brady Statistic AS VS Percent: 0 %
Date Time Interrogation Session: 20241108115900
Implantable Lead Connection Status: 753985
Implantable Lead Connection Status: 753985
Implantable Lead Implant Date: 20041110
Implantable Lead Implant Date: 20041110
Implantable Lead Location: 753859
Implantable Lead Location: 753860
Implantable Pulse Generator Implant Date: 20130318
Lead Channel Impedance Value: 377 Ohm
Lead Channel Impedance Value: 487 Ohm
Lead Channel Pacing Threshold Amplitude: 0.625 V
Lead Channel Pacing Threshold Amplitude: 0.625 V
Lead Channel Pacing Threshold Pulse Width: 0.4 ms
Lead Channel Pacing Threshold Pulse Width: 0.4 ms
Lead Channel Setting Pacing Amplitude: 2 V
Lead Channel Setting Pacing Amplitude: 2.5 V
Lead Channel Setting Pacing Pulse Width: 0.4 ms
Lead Channel Setting Sensing Sensitivity: 4 mV
Zone Setting Status: 755011
Zone Setting Status: 755011

## 2023-05-13 ENCOUNTER — Other Ambulatory Visit: Payer: Self-pay | Admitting: Internal Medicine

## 2023-05-13 DIAGNOSIS — E039 Hypothyroidism, unspecified: Secondary | ICD-10-CM

## 2023-05-15 ENCOUNTER — Other Ambulatory Visit: Payer: Self-pay | Admitting: Physician Assistant

## 2023-05-15 DIAGNOSIS — G8929 Other chronic pain: Secondary | ICD-10-CM

## 2023-05-19 ENCOUNTER — Other Ambulatory Visit: Payer: Self-pay | Admitting: Family

## 2023-05-19 ENCOUNTER — Telehealth: Payer: Self-pay | Admitting: Family Medicine

## 2023-05-19 ENCOUNTER — Other Ambulatory Visit (HOSPITAL_BASED_OUTPATIENT_CLINIC_OR_DEPARTMENT_OTHER): Payer: Self-pay

## 2023-05-19 ENCOUNTER — Other Ambulatory Visit: Payer: Self-pay | Admitting: Family Medicine

## 2023-05-19 DIAGNOSIS — M546 Pain in thoracic spine: Secondary | ICD-10-CM

## 2023-05-19 DIAGNOSIS — M545 Low back pain, unspecified: Secondary | ICD-10-CM

## 2023-05-19 MED ORDER — TRAMADOL HCL 50 MG PO TABS
50.0000 mg | ORAL_TABLET | Freq: Four times a day (QID) | ORAL | 0 refills | Status: DC | PRN
  Filled 2023-05-19: qty 60, 8d supply, fill #0

## 2023-05-19 NOTE — Telephone Encounter (Signed)
Pt would like to know if pcp is going to fill this or if another dr will fill this today. Stated she has been waiting for a few days and is in a lot of pain. Please advise.

## 2023-05-19 NOTE — Telephone Encounter (Signed)
Requesting: Tramadol Contract: n/a UDS: n/a Last OV: 03/11/23 Next OV: n/a Last Refill: 03/31/23, #60--0 RF Database:   Please advise

## 2023-05-19 NOTE — Telephone Encounter (Signed)
Pt called to follow up on her tramadol refill. Pt said she has been out of this medication for last 4-5 days and tylenol is not cutting it. Seems like the pharmacy sent to NP instead of provider so it was not filled. Please send to [facility]

## 2023-05-19 NOTE — Addendum Note (Signed)
Addended by: Roxanne Gates on: 05/19/2023 01:02 PM   Modules accepted: Orders

## 2023-05-19 NOTE — Telephone Encounter (Signed)
Requesting: Tramadol 50mg  Contract: N/A UDS: N/A Last Visit: 03/11/2023 Next Visit: N/A Last Refill: 03/31/2023  Please Advise

## 2023-05-22 ENCOUNTER — Encounter: Payer: Self-pay | Admitting: Family Medicine

## 2023-05-23 ENCOUNTER — Other Ambulatory Visit (HOSPITAL_BASED_OUTPATIENT_CLINIC_OR_DEPARTMENT_OTHER): Payer: Self-pay

## 2023-05-23 ENCOUNTER — Other Ambulatory Visit: Payer: Self-pay | Admitting: Family Medicine

## 2023-05-23 MED ORDER — DICLOFENAC SODIUM 1 % EX GEL
4.0000 g | Freq: Four times a day (QID) | CUTANEOUS | 3 refills | Status: AC
  Filled 2023-05-23: qty 100, 6d supply, fill #0

## 2023-05-24 NOTE — Telephone Encounter (Signed)
Pt ready for scheduling on or after 05/26/23  Out-of-pocket cost due at time of visit: $0  Primary: Medicare Prolia co-insurance: 20% (approximately $320.99) Admin fee co-insurance: 20% (approximately $25)  Deductible: $240 kof $240 met  Prior Auth: NOT required PA# Valid:  Secondary: [place] [employer] Prolia co-insurance: Covers Medicare Part B co-insurance Admin fee co-insurance: Covers Medicare Part B co-insurance  Deductible:  Covered by secondary  Prior Auth: NOT required PA# Valid:   ** This summary of benefits is an estimation of the patient's out-of-pocket cost. Exact cost may vary based on individual plan coverage.

## 2023-05-24 NOTE — Telephone Encounter (Signed)
Prior Authorization NOT required for PROLIA  Primary: Medicare  COVERAGE DETAILS: For the Primary MD Purchase access option, benefits are subject to a $240  deductible ($240 met) and 20% co-insurance for the administration and cost of Prolia.    Secondary: [place] [employer]  COVERAGE DETAILS: For the Secondary MD Purchase access option, the secondary plan follows  Medicare guidelines. The secondary plan will consider the Medicare Part B deductible and co-insurance.

## 2023-05-27 ENCOUNTER — Encounter: Payer: Self-pay | Admitting: Internal Medicine

## 2023-05-27 ENCOUNTER — Ambulatory Visit: Payer: Medicare Other

## 2023-05-27 ENCOUNTER — Ambulatory Visit: Payer: Medicare Other | Admitting: Internal Medicine

## 2023-05-27 VITALS — BP 138/80 | HR 75 | Ht 62.99 in | Wt 145.8 lb

## 2023-05-27 DIAGNOSIS — E559 Vitamin D deficiency, unspecified: Secondary | ICD-10-CM | POA: Diagnosis not present

## 2023-05-27 DIAGNOSIS — M810 Age-related osteoporosis without current pathological fracture: Secondary | ICD-10-CM

## 2023-05-27 DIAGNOSIS — E890 Postprocedural hypothyroidism: Secondary | ICD-10-CM

## 2023-05-27 MED ORDER — DENOSUMAB 60 MG/ML ~~LOC~~ SOSY
60.0000 mg | PREFILLED_SYRINGE | Freq: Once | SUBCUTANEOUS | Status: AC
  Administered 2023-11-26: 60 mg via SUBCUTANEOUS

## 2023-05-27 MED ORDER — DENOSUMAB 60 MG/ML ~~LOC~~ SOSY
60.0000 mg | PREFILLED_SYRINGE | Freq: Once | SUBCUTANEOUS | Status: AC
  Administered 2023-05-27: 60 mg via SUBCUTANEOUS

## 2023-05-27 NOTE — Progress Notes (Signed)
Patient ID: [name], female   DOB: [birth year], 82 y.o.   MRN: [id number]   HPI  [name] is a 82 y.o.-year-old female, referred by her PCP, Dr. Laury Axon, presenting for follow-up for osteoporosis.  Last visit 1.5 years ago.  Interim history: No falls or fractures since last visit. Also, no dizziness/vertigo. She had back pain over the summer, starting in 11/2022 and was in PT, started in 03/2023, now finished. She still has back pain >> no exercise.  Reviewed and addended history: Pt was dx with OP in in 1999-2000, in [place].  Reviewed her DXA scan reports: Date L1-L4 T score FN T score 33% distal Radius Ultra distal radius  12/06/2021 ([facility]) N/a LFN: -3.0 RFN: -3.0 (+2.0%) -2.9 (+1.1% -4.5  12/02/2019 ([facility]) N/a LFN: -2.8 RFN: -2.7  (+9.4%*) -3.0 (+2.2%) -4.0  05/29/2017 ([facility]) N/a (multiple compression fractures) LFN: -3.1 (+0.5%) RFN: -2.9 -3.2 (-1.0%) -4.6  05/29/2015 ([facility]) L1, L3: -2.7 LFN: -3.1 -3.1 n/a  06/04/2012 L1-L4: -3.4 LFN: -2.2 RFN: -2.2 n/a n/a   Fractures: 7 vertebral fractures with 6 kyphoplasties: Reviewed CT Lumbar spine report from 2016: Remote compression fractures with cement augmentation T11, T12, L2, L4 and L5. Tiny amount of cement seen adjacent to the right L4 and L5 nerve roots. Subacute superior endplate compression deformity L1 and L3.  CT lumbar spine (04/16/2023): 1. Old vertebral augmentations at T11, T12, L2, L4 and L5 as seen on the study of 2016. No new fracture or additional collapse. 2. Fusion or near fusion of the facet joints on the right from L2-3 through L4-5. 3. L4-5: Bulging of the disc. Mild facet and ligamentous hypertrophy. Mild stenosis of both lateral recesses could possibly be symptomatic. 4. L5-S1: Mild disc bulge. Bilateral facet osteoarthritis with 1-2 mm of anterolisthesis. No apparent compressive stenosis. 5. Sacroiliac osteoarthritis, left worse  than right. No evidence of sacral insufficiency fracture.  Osteoporosis treatments: - Fosamax  - 2 years >> ineffective - Forteo - 2 years  - 2003 - Prolia - started 2012 (however, only 1 documented inj/year in 2012, 2014, 2015 and no doses documented in 2017):  10/09/2017 05/14/2018 11/19/2018 06/01/2019 12/15/2019 06/15/2020 12/20/2020 06/29/2021 01/09/2022 11/22/2022  She has a history of vitamin D insufficiency.  Reviewed available vit D levels: Lab Results  Component Value Date   VD25OH 83.86 11/22/2021   VD25OH 69.4 12/20/2020   VD25OH 52.5 11/16/2019   VD25OH 37.32 01/18/2019   VD25OH 29.82 (L) 10/06/2017   VD25OH 32 07/04/2009   VD25OH 24 (L) 02/14/2009   On vitamin D 7000 units daily: Vit D3...............5,000 i.u. Multi w/D3.......1,000 i.u. . -She is having problems swallowing this due to the large tablet.  She was prev. doing weightbearing exercises: stretch bands; and also balance exercises, walking on treadmill. Not in last few mo.  She does not take high vitamin A doses.  No steroid injections.  She had early menopause, at 82 to 82 years old.  No HRT.   Pt does have a FH of osteoporosis: Mother- fx'ed ankle, foot.  No history of kidney stones, hyper or hypocalcemia or hyperparathyroidism: Lab Results  Component Value Date   PTH 25 10/06/2017   PTH Comment 10/06/2017   CALCIUM 10.1 01/06/2023   CALCIUM 9.8 08/27/2021   CALCIUM 9.1 06/06/2021   CALCIUM 9.4 10/16/2020   CALCIUM 9.1 09/29/2020   CALCIUM 9.6 09/21/2020   CALCIUM 8.6 (L) 09/17/2020   CALCIUM  8.4 (L) 09/16/2020   CALCIUM 8.8 (L) 09/15/2020   CALCIUM 9.3 09/14/2020   No history of thyrotoxicosis. She has a h/o thyroid nodules >> now s/p total thyroidectomy, on LT4 100 mcg.  Latest TSH was normal: Lab Results  Component Value Date   TSH 4.19 01/06/2023   TSH 1.03 11/22/2021   TSH 1.33 12/20/2020   TSH 1.34 12/11/2020   TSH 3.04 07/12/2019   Pt is on levothyroxine 100 mcg daily,  taken: - in am - fasting - at least 1h from b'fast - stopped calcium  - no iron - + multivitamins >> later in the day - + PPIs in am 30 min before b'fast >> stopped - not on Biotin  No history of CKD. She had only 1 kidney. Last BUN/Cr: Lab Results  Component Value Date   BUN 33 (H) 01/06/2023   CREATININE 1.02 01/06/2023   She also has HTN.  ROS: + leg swelling (wears compression hoses), + see HPI  I reviewed pt's medications, allergies, PMH, social hx, family hx, and changes were documented in the history of present illness. Otherwise, unchanged from my initial visit note.  Past Medical History:  Diagnosis Date   Atrioventricular block, complete (HCC)    a. 08/2011 Upgrade of PPM to MDT Adapta L Dual Chamber PPM ser # [id number].   Cardiac pacemaker in situ    Contact dermatitis and eczema    unspec cause   Dairy product intolerance    Depression    Diastolic dysfunction    GERD (gastroesophageal reflux disease)    Hiatal hernia    History of diverticulitis of colon    2003  perforated diverticulitis w/ surgical intervention   History of kidney stones    age 39   History of peripheral edema    lower extremities   History of thyroid nodule    multinodular goiter  s/p  total thyroidectomy 2014   HTN (hypertension)    Hydronephrosis, left    Hypothyroidism    Mild sleep apnea    per study 04-14-2014   Nocturia    NSVT (nonsustained ventricular tachycardia) (HCC)    Osteoporosis    PAF (paroxysmal atrial fibrillation) (HCC)    PONV (postoperative nausea and vomiting)    Pulmonary nodules    stable per last ct   Renal cell carcinoma of left kidney (HCC)    Superficial thrombophlebitis    Past Surgical History:  Procedure Laterality Date   CARDIOVASCULAR STRESS TEST  04-20-2015  dr Graciela Husbands   normal nuclear study/  no ischemia/  normal LV function and wall motion, ef 74%   COLONOSCOPY  01/19/2013   COLOSTOMY TAKEDOWN  07-22-2002   CYSTOSCOPY WITH RETROGRADE  PYELOGRAM, URETEROSCOPY AND STENT PLACEMENT Left 07/28/2015   Procedure: CYSTOSCOPY WITH LEFT RETROGRADE PYELOGRAM, POSSIBLE LEFT URETEROSCOPY AND STENT PLACEMENT;  Surgeon: Marcine Matar, MD;  Location: [facility];  Service: Urology;  Laterality: Left;   EXPLORATORY LAPARTOMY SIGMOID COLECTOMY/ COLOSTOMY  04-28-2002   perforated diverticulitis   KYPHOPLASTY     LAPAROSCOPY VENTRAL HERNIA REPAIR/  EXTENSIVE LYSIS ADHESIONS  05-04-2004   PACEMAKER GENERATOR CHANGE Left 09/16/2011   MDT ADDRL1 pacemaker   PACEMAKER PLACEMENT  05-11-2003     medtronic   ROBOT ASSISTED LAPAROSCOPIC NEPHRECTOMY Left 03/20/2016   Procedure: XI ROBOTIC ASSISTED LAPAROSCOPIC RETROPERITONEAL NEPHRECTOMY;  Surgeon: Sebastian Ache, MD;  Location: [facility];  Service: Urology;  Laterality: Left;   THYROIDECTOMY N/A 08/07/2012   Procedure: THYROIDECTOMY;  Surgeon: Tawanna Cooler  Molly Maduro, MD;  Location: [facility];  Service: General;  Laterality: N/A;   TRANSTHORACIC ECHOCARDIOGRAM  10-12-2014   moderate focal basal LVH,  ef 55-60%,  grade 2 diastolic dysfunction, mild AV calcification without stenosis,  mild MR,  trivial PR   Social History   Socioeconomic History   Marital status: Single    Spouse name: Not on file   Number of children: 2   Years of education: 16   Highest education level: Not on file  Occupational History   Occupation: Retired Nurse, adult for the [employer]    Employer: RETIRED  Tobacco Use   Smoking status: Former    Current packs/day: 0.00    Types: Cigarettes    Start date: 10/05/1964    Quit date: 10/06/1979    Years since quitting: 43.6   Smokeless tobacco: Never  Vaping Use   Vaping status: Never Used  Substance and Sexual Activity   Alcohol use: No   Drug use: No   Sexual activity: Not Currently  Other Topics Concern   Not on file  Social History Narrative   The patient is a Buyer, retail of [employer].  She worked for the [employer] in [employer].  She was married for 22 years, divorced, and has remained single. She has 1 grown son, 1 grown daughter.  She has 3 grandchildren, 2 of her children   live in [place].  She is retired.  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 She had a long term companion, 32 years, who passed away 06/21/2011.   Social Determinants of Health   Financial Resource Strain: Low Risk  (03/21/2021)   Overall Financial Resource Strain (CARDIA)    Difficulty of Paying Living Expenses: Not hard at all  Food Insecurity: No Food Insecurity (03/08/2022)   Hunger Vital Sign    Worried About Running Out of Food in the Last Year: Never true    Ran Out of Food in the Last Year: Never true  Transportation Needs: No Transportation Needs (03/08/2022)   PRAPARE - Administrator, Civil Service (Medical): No    Lack of Transportation (Non-Medical): No  Physical Activity: Insufficiently Active (03/21/2021)   Exercise Vital Sign    Days of Exercise per Week: 4 days    Minutes of Exercise per Session: 30 min  Stress: No Stress Concern Present (03/21/2021)   [employer] of Occupational Health - Occupational Stress Questionnaire    Feeling of Stress : Not at all  Social Connections: Socially Isolated (03/21/2021)   Social Connection and Isolation Panel [NHANES]    Frequency of Communication with Friends and Family: More than three times a week    Frequency of Social Gatherings with Friends and Family: More than three times a week    Attends Religious Services: Never    Database administrator or Organizations: No    Attends Banker Meetings: Never    Marital Status: Divorced  Catering manager Violence: Not At Risk (03/21/2021)   Humiliation, Afraid, Rape, and Kick questionnaire    Fear of Current or Ex-Partner: No    Emotionally Abused: No    Physically Abused: No    Sexually Abused: No   Current Outpatient Medications on File Prior to Visit  Medication Sig Dispense  Refill   acetaminophen (TYLENOL) 500 MG tablet Take 1,000 mg by mouth daily as needed for moderate pain.     amLODipine (  NORVASC) 10 MG tablet Take 1 tablet by mouth once daily 90 tablet 2   apixaban (ELIQUIS) 5 MG TABS tablet Take 1 tablet (5 mg total) by mouth 2 (two) times daily. 180 tablet 1   cephALEXin (KEFLEX) 500 MG capsule Take 1 capsule (500 mg total) by mouth 2 (two) times daily. 20 capsule 0   cyclobenzaprine (FLEXERIL) 5 MG tablet Take 1 tablet (5 mg total) by mouth 3 (three) times daily as needed for muscle spasms. 30 tablet 1   denosumab (PROLIA) 60 MG/ML SOSY injection Inject 60 mg into the skin every 6 (six) months.     diclofenac Sodium (VOLTAREN ARTHRITIS PAIN) 1 % GEL Apply 4 g topically 4 (four) times daily. 150 g 3   diphenhydramine-acetaminophen (TYLENOL PM) 25-500 MG TABS tablet Take 2 tablets by mouth at bedtime as needed (sleep/pain).     fluticasone (FLONASE) 50 MCG/ACT nasal spray Place 2 sprays into both nostrils daily as needed for allergies or rhinitis. 16 g 5   hydrochlorothiazide (MICROZIDE) 12.5 MG capsule Take 1 capsule by mouth once daily 90 capsule 1   levocetirizine (XYZAL) 5 MG tablet Take 1 tablet (5 mg total) by mouth every evening. 90 tablet 3   levothyroxine (SYNTHROID) 100 MCG tablet TAKE 1 TABLET BY MOUTH ONCE DAILY . APPOINTMENT REQUIRED FOR FUTURE REFILLS 30 tablet 0   lisinopril (ZESTRIL) 40 MG tablet Take 1 tablet (40 mg total) by mouth daily. 90 tablet 1   loratadine (ALLERGY RELIEF, LORATADINE,) 10 MG tablet Take 1 tablet (10 mg total) by mouth daily as needed for allergies or rhinitis. 100 tablet 0   traMADol (ULTRAM) 50 MG tablet Take 1-2 tablets (50-100 mg total) by mouth every 6 (six) hours as needed for pain. 60 tablet 0   Vibegron (GEMTESA) 75 MG TABS Take 1 tablet (75 mg total) by mouth daily. 30 tablet 2   Vibegron (GEMTESA) 75 MG TABS Take 1 tablet (75 mg total) by mouth daily.     Current Facility-Administered Medications on File Prior  to Visit  Medication Dose Route Frequency Provider Last Rate Last Admin   aminophylline injection 150 mg  150 mg Intravenous BID PRN Lars Masson, MD   150 mg at 04/20/15 1200   Allergies  Allergen Reactions   Tape Rash   Codeine Nausea And Vomiting   Family History  Problem Relation Age of Onset   Heart disease Mother        Stent in her 56s   Stroke Mother    Hypertension Mother    Heart failure Father    Diverticulosis Sister    Hypertension Brother    Heart disease Brother    Hypertension Brother    Heart attack Neg Hx    PE: BP 138/80   Pulse 75   Ht 5' 2.99" (1.6 m)   Wt 145 lb 12.8 oz (66.1 kg)   SpO2 98%   BMI 25.84 kg/m  Wt Readings from Last 3 Encounters:  05/27/23 145 lb 12.8 oz (66.1 kg)  04/15/23 145 lb (65.8 kg)  03/11/23 149 lb 9.6 oz (67.9 kg)   Constitutional: normal weight, in NAD Eyes:  EOMI, no exophthalmos ENT: no neck masses, no cervical lymphadenopathy Cardiovascular: RRR, No MRG Respiratory: CTA B Musculoskeletal: no deformities Skin:no rashes Neurological: no tremor with outstretched hands  Assessment: 1. Osteoporosis  2.  Vitamin D insufficiency  3.  Postsurgical hypothyroidism  Plan: 1. Osteoporosis -Likely postmenopausal (early menopause, no HRT), + age-related, + with  family history of osteoporosis -I reviewed her bone density scan from 11/2019: She had significant increase in bone density at the femoral necks.  T score was low in the ultra distal and 33% distal radius, but improved.  She had another bone density scan 11/2021 which showed a proximal stability at the femoral neck and 33% distal radius but decrease in BMD at the level of her ultra distal radius, which has a very low T-score, of -4.5.  She remains at high risk for fractures.  She did have increased back pain recently and a new lumbar CT showed no new fractures (04/2023). -In the past investigation was negative for excessive steroid production, hyperparathyroidism,  severe vitamin D deficiency, RA, Cushing syndrome, systemic mastocytosis.  She did not have a history of cancer to suggest metastasis to the bone, no renal failure to indicate multiple myeloma, no history of low alkaline phosphatase, no previous fractures before her vertebral fx or blue sclerae to indicate osteogenesis imperfecta.  -She had several vertebral fractures, possibly as a consequence of early menopause and also missed Prolia injections -We did discuss in the past and again today about not missing the injections and not delaying the MRI more than 7 months due to the increased risk of fracture and loss of BMD.  She did have a larger gap between injections from 01/17/2022 to 11/22/2022.  Upon questioning, she mentions that she was not called to schedule this.  I advised her to put it on the calendar for the right month and then to call us if she is not called about the injections by the middle of the month. -She was on Prolia for more than 10 years however, she did miss doses in the past.  For now, especially if the bone density continues to improve or stay stable, we can continue with it.  We did discuss that the indication for this can go beyond 10 years with continuing fracture benefits.  She tolerates the medication well, without jaw/hip/thigh pain. -Will planning to stop Prolia, we need to give her 1-2 doses of Reclast -She did have a history of using Forteo for 2 years.  We could use romosozumab afterwards if absolutely needed. -Resume calcium and vitamin D supplements-last calcium level was normal: Lab Results  Component Value Date   CALCIUM 10.1 01/06/2023  -We discussed about weightbearing and balance exercises-she is using bands and weights -I will see her back in 1 year -advised her to schedule this at today's visit as she forgot about when she had to come back after last visit so she returns later than recommended.  2.  Vitamin D insufficiency -Latest vitamin D level was normal but  higher in the normal range: Lab Results  Component Value Date   VD25OH 83.86 11/22/2021  -We decreased the dose from 7000 to 6000 units vitamin D daily -At last visit, since she was complaining that her vitamin D/calcium tablet was too large, I advised her to stop it.  She was getting calcium from the multivitamin and I advised her to getting the rest from the diet (for a total of 1200 mg daily at least)  -Recheck the vitamin D and today's visit  3.  Postsurgical hypothyroidism - latest thyroid labs reviewed with pt. >> normal: Lab Results  Component Value Date   TSH 4.19 01/06/2023  - she continues on LT4 100 mcg daily - pt feels good on this dose. - we discussed about taking the thyroid hormone every day, with water, >30 minutes  before breakfast, separated by >4 hours from acid reflux medications, calcium, iron, multivitamins. Pt. is taking it correctly.  +Prolia inj. today  Carlus Pavlov, MD PhD [facility]

## 2023-05-27 NOTE — Patient Instructions (Signed)
Please stop at the lab.  Continue Levothyroxine 100 mcg daily.  Take the thyroid hormone every day, with water, at least 30 minutes before breakfast, separated by at least 4 hours from: - acid reflux medications - calcium - iron - multivitamins  Please come back for a follow-up appointment in 1 year.

## 2023-05-28 LAB — VITAMIN D 25 HYDROXY (VIT D DEFICIENCY, FRACTURES): Vit D, 25-Hydroxy: 78 ng/mL (ref 30–100)

## 2023-05-28 NOTE — Progress Notes (Signed)
Remote pacemaker transmission.   

## 2023-05-30 NOTE — Telephone Encounter (Signed)
Last Prolia inj 05/27/23 Next Prolia inj due 11/25/23

## 2023-06-02 ENCOUNTER — Encounter: Payer: Self-pay | Admitting: Physical Medicine and Rehabilitation

## 2023-06-02 ENCOUNTER — Ambulatory Visit (INDEPENDENT_AMBULATORY_CARE_PROVIDER_SITE_OTHER): Payer: Medicare Other | Admitting: Physical Medicine and Rehabilitation

## 2023-06-02 DIAGNOSIS — M5441 Lumbago with sciatica, right side: Secondary | ICD-10-CM | POA: Diagnosis not present

## 2023-06-02 DIAGNOSIS — M48062 Spinal stenosis, lumbar region with neurogenic claudication: Secondary | ICD-10-CM

## 2023-06-02 DIAGNOSIS — Z8781 Personal history of (healed) traumatic fracture: Secondary | ICD-10-CM | POA: Diagnosis not present

## 2023-06-02 DIAGNOSIS — G8929 Other chronic pain: Secondary | ICD-10-CM

## 2023-06-02 DIAGNOSIS — M5416 Radiculopathy, lumbar region: Secondary | ICD-10-CM | POA: Diagnosis not present

## 2023-06-02 NOTE — Progress Notes (Signed)
MOE ROHLFS - 82 y.o. female MRN [id number]  Date of birth: [birth year]  Office Visit Note: Visit Date: 06/02/2023 PCP: Donato Schultz, DO Referred by: Donato Schultz, *  Subjective: Chief Complaint  Patient presents with   Lower Back - Pain   HPI: [name] is a 82 y.o. female who comes in today per the request of [place] Persons, PA for evaluation of chronic, worsening and severe right sided lower back pain radiating to buttock, hip and right lateral thigh to knee. Also reports paresthesias to bilateral lower extremities. Pain ongoing for several years, worsened this past June. Her pain becomes severe with sitting and prolonged standing/walking. She describes pain as deep aching and burning sensation, currently rates as 8 out of 10. Some relief of pain with home exercise regimen, rest and use of medications. She is currently taking Tramadol and Flexeril that is prescribed by her primary care provider Dr. Seabron Spates. Recently completed short course of formal physical therapy with [facility]. Recent CT of lumbar spine exhibits  old vertebral augmentations at T11, T12, L2, L4 and L5, fusion or near fusion of the facet joints on the right from L2-L3 through L4-L5. There is also bilateral facet osteoarthritis with 1-2 mm of anterolisthesis. History of multiple vertebroplasty/kyphoplasty procedures with Dr. Julieanne Cotton in 07/07/2014. No history of lumbar epidural steroid injections. Patient denies focal weakness. No recent trauma or falls.   Patient's course is complicated by atrial fibrillation (currently taking Eliquis), ventricular tachycardiac and heart failure. She is currently managed by Dr. Sherryl Manges with [facility]. History of osteoporosis and multiple vertebral compression fractures. She remains on Prolia. She was recently evaluated by Dr. Carlus Pavlov with endocrinology.      Review of Systems  Musculoskeletal:  Positive for back pain.   Neurological:  Positive for tingling. Negative for focal weakness and weakness.  All other systems reviewed and are negative.  Otherwise per HPI.  Assessment & Plan: Visit Diagnoses:    ICD-10-CM   1. Lumbar radiculopathy  M54.16 Ambulatory referral to Physical Medicine Rehab    2. Spinal stenosis of lumbar region with neurogenic claudication  M48.062 Ambulatory referral to Physical Medicine Rehab    3. Chronic right-sided low back pain with right-sided sciatica  M54.41 Ambulatory referral to Physical Medicine Rehab   G89.29     4. History of vertebral compression fracture  Z87.81 Ambulatory referral to Physical Medicine Rehab       Plan: Findings:  Chronic, worsening and severe right sided lower back pain radiating to buttock, lateral lip and down lateral leg to knee. Patient continues to have severe pain despite good conservative therapies such as formal physical therapy, home exercise regimen, rest and use of medications. Upon further investigation of imaging studies, CT of lumbar spine from 07/08/2015 does show mild to moderate spinal canal stenosis at L4-L5. There is ligamentum flavum thickening and moderate to severe spinal canal stenosis at L4-L5 on recent CT imaging that appears to be unchanged. I do think her symptoms are consistent with neurogenic claudication, she has severe pain with standing/walking and paresthesias to bilateral lower extremities. We discussed treatment plan in detail today, next step is to perform diagnostic and hopefully therapeutic right L5 transforaminal epidural steroid injection under fluoroscopic guidance. We do not need to discontinue Eliquis for this type of injection. If good relief of pain with injection we can repeat this procedure infrequently as needed. Dr. Alvester Morin at bedside to discuss injection procedure,  she has no questions at this time. She can continue chronic pain management with her primary care provider. No red flag symptoms noted upon exam today.               Meds & Orders: No orders of the defined types were placed in this encounter.   Orders Placed This Encounter  Procedures   Ambulatory referral to Physical Medicine Rehab    Follow-up: Return for Right L5 transforaminal epidural steroid injection.   Procedures: No procedures performed      Clinical History: CLINICAL DATA:  Spinal stenosis, lumbar. Low back and right hip pain. Previous kyphoplasty.   EXAM: CT LUMBAR SPINE WITHOUT CONTRAST   TECHNIQUE: Multidetector CT imaging of the lumbar spine was performed without intravenous contrast administration. Multiplanar CT image reconstructions were also generated.   RADIATION DOSE REDUCTION: This exam was performed according to the departmental dose-optimization program which includes automated exposure control, adjustment of the mA and/or kV according to patient size and/or use of iterative reconstruction technique.   COMPARISON:  CT 05/11/2015.  Radiography 01/06/2023.   FINDINGS: Segmentation: 5 lumbar type vertebral bodies as numbered previously.   Alignment: No significant malalignment. 2 mm degenerative anterolisthesis L5-S1.   Vertebrae: Old vertebral augmentations at T11, T12, L2, L4 and L5 as seen on the study of 2016. No new fracture or additional collapse. Fusion or near fusion of the facet joints on the right from L2-3 through L4-5.   Paraspinal and other soft tissues: Negative   Disc levels: No canal or foraminal stenosis from T10-11 through L3-4.   At L4-5, there is bulging of the disc. There is mild facet and ligamentous hypertrophy. Mild stenosis of both lateral recesses could possibly be symptomatic.   At L5-S1, the disc bulges mildly. There is bilateral facet osteoarthritis with 1-2 mm of anterolisthesis. No apparent compressive stenosis.   Sacroiliac osteoarthritis is noted, left worse than right. No evidence of sacral insufficiency fracture. Calcification associated with  the right S2 nerve, which is chronic.   IMPRESSION: 1. Old vertebral augmentations at T11, T12, L2, L4 and L5 as seen on the study of 2016. No new fracture or additional collapse. 2. Fusion or near fusion of the facet joints on the right from L2-3 through L4-5. 3. L4-5: Bulging of the disc. Mild facet and ligamentous hypertrophy. Mild stenosis of both lateral recesses could possibly be symptomatic. 4. L5-S1: Mild disc bulge. Bilateral facet osteoarthritis with 1-2 mm of anterolisthesis. No apparent compressive stenosis. 5. Sacroiliac osteoarthritis, left worse than right. No evidence of sacral insufficiency fracture.     Electronically Signed   By: Paulina Fusi M.D.   On: 05/12/2023 09:36   She reports that she quit smoking about 43 years ago. Her smoking use included cigarettes. She started smoking about 58 years ago. She has never used smokeless tobacco. No results for input(s): "HGBA1C", "LABURIC" in the last 8760 hours.  Objective:  VS:  HT:    WT:   BMI:     BP:   HR: bpm  TEMP: ( )  RESP:  Physical Exam Vitals reviewed.  HENT:     Head: Normocephalic and atraumatic.     Right Ear: External ear normal.     Left Ear: External ear normal.     Nose: Nose normal.     Mouth/Throat:     Mouth: Mucous membranes are moist.  Eyes:     Extraocular Movements: Extraocular movements intact.  Cardiovascular:     Rate and  Rhythm: Normal rate.     Pulses: Normal pulses.  Pulmonary:     Effort: Pulmonary effort is normal.  Abdominal:     General: Abdomen is flat. There is no distension.  Musculoskeletal:        General: Tenderness present.     Cervical back: Normal range of motion.     Comments: Patient rises from seated position to standing without difficulty. Good lumbar range of motion. No pain noted with facet loading. 5/5 strength noted with bilateral hip flexion, knee flexion/extension, ankle dorsiflexion/plantarflexion and EHL. No clonus noted bilaterally. No pain upon  palpation of greater trochanters. No pain with internal/external rotation of bilateral hips. Sensation intact bilaterally. Negative slump test bilaterally. Ambulates without aid, gait steady.     Skin:    General: Skin is warm and dry.     Capillary Refill: Capillary refill takes less than 2 seconds.  Neurological:     General: No focal deficit present.     Mental Status: She is alert and oriented to person, place, and time.  Psychiatric:        Mood and Affect: Mood normal.        Behavior: Behavior normal.     Ortho Exam  Imaging: No results found.  Past Medical/Family/Surgical/Social History: Medications & Allergies reviewed per EMR, new medications updated. Patient Active Problem List   Diagnosis Date Noted   Nocturia 04/16/2023   Feeling of incomplete bladder emptying 04/16/2023   Pelvic pain 04/16/2023   Radiculopathy, lumbar region 04/01/2023   Frequency of urination 03/11/2023   Need for influenza vaccination 03/11/2023   Degenerative disc disease, lumbar 01/06/2023   Neck pain 11/08/2022   Degenerative disc disease, cervical 11/08/2022   Acute bilateral low back pain with bilateral sciatica 04/25/2022   History of vertebral compression fracture 04/25/2022   Non-recurrent acute serous otitis media of right ear 09/02/2021   Chronic diastolic heart failure (HCC) 07/04/2021   Aortic ectasia, unspecified site (HCC) 07/04/2021   SOB (shortness of breath) 06/15/2021   Pulmonary nodule 06/15/2021   Hypokalemia 10/02/2020   Neuropathy 10/02/2020   Elevated serum creatinine 10/02/2020   AKI (acute kidney injury) (HCC) 09/15/2020   Left-sided chest pain    Congestion of nasal sinus    Substernal chest pain 09/13/2020   Spider veins 04/05/2020   Varicose veins of bilateral lower extremities with other complications 04/05/2020   H/O seasonal allergies 04/05/2020   Numbness and tingling of both feet 12/23/2019   Paroxysmal atrial fibrillation (HCC) 07/22/2019   V-tach  (HCC) 07/22/2019   Lipoma of left upper extremity 03/11/2019   Hordeolum externum of left upper eyelid 02/15/2019   Hyponatremia 02/15/2019   Hyperlipidemia 06/03/2017   Renal mass 03/20/2016   Fecal impaction (HCC) 10/16/2013   Vertebral compression fracture (HCC) 10/16/2013   Constipation 10/15/2013   Abdominal pain 10/15/2013   PUD (peptic ulcer disease) 10/15/2013   Low back pain with radiation 10/15/2013   Hypothyroidism, 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 08/2011 Upgrade of PPM to MDT Adapta L Dual Chamber PPM ser # [id number].   Cardiac pacemaker in situ    Contact dermatitis and eczema    unspec cause   Dairy product intolerance    Depression    Diastolic dysfunction    GERD (gastroesophageal reflux disease)    Hiatal hernia    History of diverticulitis of colon    2003  perforated diverticulitis w/ surgical intervention   History of kidney stones    age 70   History of peripheral edema    lower extremities   History of thyroid nodule    multinodular goiter  s/p  total thyroidectomy 2014   HTN (hypertension)    Hydronephrosis, left    Hypothyroidism    Mild sleep apnea    per study 04-14-2014   Nocturia    NSVT (nonsustained ventricular tachycardia) (HCC)    Osteoporosis    PAF (paroxysmal atrial fibrillation) (HCC)    PONV (postoperative nausea  and vomiting)    Pulmonary nodules    stable per last ct   Renal cell carcinoma of left kidney (HCC)    Superficial thrombophlebitis    Family History  Problem Relation Age of Onset   Heart disease Mother        Stent in her 55s   Stroke Mother    Hypertension Mother    Heart failure Father    Diverticulosis Sister    Hypertension Brother    Heart disease Brother    Hypertension Brother    Heart attack Neg Hx    Past Surgical History:  Procedure Laterality Date   CARDIOVASCULAR STRESS TEST  04-20-2015  dr Graciela Husbands   normal nuclear study/  no ischemia/  normal LV function and wall motion, ef 74%   COLONOSCOPY  01/19/2013   COLOSTOMY TAKEDOWN  07-22-2002   CYSTOSCOPY WITH RETROGRADE PYELOGRAM, URETEROSCOPY AND STENT PLACEMENT Left 07/28/2015   Procedure: CYSTOSCOPY WITH LEFT RETROGRADE PYELOGRAM, POSSIBLE LEFT URETEROSCOPY AND STENT PLACEMENT;  Surgeon: Marcine Matar, MD;  Location: [facility];  Service: Urology;  Laterality: Left;   EXPLORATORY LAPARTOMY SIGMOID COLECTOMY/ COLOSTOMY  04-28-2002   perforated diverticulitis   KYPHOPLASTY     LAPAROSCOPY VENTRAL HERNIA REPAIR/  EXTENSIVE LYSIS ADHESIONS  05-04-2004   PACEMAKER GENERATOR CHANGE Left 09/16/2011   MDT ADDRL1 pacemaker   PACEMAKER PLACEMENT  05-11-2003     medtronic   ROBOT ASSISTED LAPAROSCOPIC NEPHRECTOMY Left 03/20/2016   Procedure: XI ROBOTIC ASSISTED LAPAROSCOPIC RETROPERITONEAL NEPHRECTOMY;  Surgeon: Sebastian Ache, MD;  Location: [facility];  Service: Urology;  Laterality: Left;   THYROIDECTOMY N/A 08/07/2012   Procedure: THYROIDECTOMY;  Surgeon: Velora Heckler, MD;  Location: [facility];  Service: General;  Laterality: N/A;   TRANSTHORACIC ECHOCARDIOGRAM  10-12-2014   moderate focal basal LVH,  ef 55-60%,  grade 2 diastolic dysfunction, mild AV calcification without stenosis,  mild MR,  trivial PR   Social History   Occupational History   Occupation: Retired Nurse, adult for the [employer]     Employer: RETIRED  Tobacco Use   Smoking status: Former    Current packs/day: 0.00    Types: Cigarettes    Start date: 10/05/1964    Quit date: 10/06/1979    Years since quitting: 43.7   Smokeless tobacco: Never  Vaping Use   Vaping status: Never Used  Substance and Sexual Activity   Alcohol use: No  Drug use: No   Sexual activity: Not Currently

## 2023-06-17 ENCOUNTER — Other Ambulatory Visit: Payer: Self-pay | Admitting: Internal Medicine

## 2023-06-17 DIAGNOSIS — E039 Hypothyroidism, unspecified: Secondary | ICD-10-CM

## 2023-06-18 ENCOUNTER — Telehealth: Payer: Self-pay

## 2023-06-18 NOTE — Telephone Encounter (Signed)
Copied from CRM [id number]. Topic: Clinical - Prescription Issue >> Jun 18, 2023  4:04 PM Larwance Sachs wrote: Reason for CRM: Joselyn Glassman called in from [facility] regarding levothyroxine (SYNTHROID) 100 MCG tablet, stated [facility] no longer carries this specific medication and needs manufacture change approval -medication can not be filled until response is received  Please call back for follow up

## 2023-06-19 ENCOUNTER — Other Ambulatory Visit: Payer: Self-pay

## 2023-06-19 ENCOUNTER — Ambulatory Visit: Payer: Medicare Other | Admitting: Physical Medicine and Rehabilitation

## 2023-06-19 DIAGNOSIS — M5416 Radiculopathy, lumbar region: Secondary | ICD-10-CM

## 2023-06-19 MED ORDER — METHYLPREDNISOLONE ACETATE 40 MG/ML IJ SUSP
40.0000 mg | Freq: Once | INTRAMUSCULAR | Status: AC
  Administered 2023-06-19: 40 mg

## 2023-06-19 MED ORDER — METHYLPREDNISOLONE ACETATE 40 MG/ML IJ SUSP: 40.0000 mg | Freq: Once | INTRAMUSCULAR | Status: DC

## 2023-06-19 NOTE — Patient Instructions (Signed)

## 2023-06-19 NOTE — Procedures (Signed)
Lumbosacral Transforaminal Epidural Steroid Injection - Sub-Pedicular Approach with Fluoroscopic Guidance  Patient: [name]      Date of Birth: 11/23/1940 MRN: [id number] PCP: Donato Schultz, DO      Visit Date: 06/19/2023   Universal Protocol:    Date/Time: 06/19/2023  Consent Given By: the patient  Position: PRONE  Additional Comments: Vital signs were monitored before and after the procedure. Patient was prepped and draped in the usual sterile fashion. The correct patient, procedure, and site was verified.   Injection Procedure Details:   Procedure diagnoses: Lumbar radiculopathy [M54.16]    Meds Administered:  Meds ordered this encounter  Medications   methylPREDNISolone acetate (DEPO-MEDROL) injection 40 mg    Laterality: Right  Location/Site: L5  Needle:5.0 in., 22 ga.  Short bevel or Quincke spinal needle  Needle Placement: Transforaminal  Findings:    -Comments: Excellent flow of contrast along the nerve, nerve root and into the epidural space.  Procedure Details: After squaring off the end-plates to get a true AP view, the C-arm was positioned so that an oblique view of the foramen as noted above was visualized. The target area is just inferior to the "nose of the scotty dog" or sub pedicular. The soft tissues overlying this structure were infiltrated with 2-3 ml. of 1% Lidocaine without Epinephrine.  The spinal needle was inserted toward the target using a "trajectory" view along the fluoroscope beam.  Under AP and lateral visualization, the needle was advanced so it did not puncture dura and was located close the 6 O'Clock position of the pedical in AP tracterory. Biplanar projections were used to confirm position. Aspiration was confirmed to be negative for CSF and/or blood. A 1-2 ml. volume of Isovue-250 was injected and flow of contrast was noted at each level. Radiographs were obtained for documentation purposes.   After attaining the desired  flow of contrast documented above, a 0.5 to 1.0 ml test dose of 0.25% Marcaine was injected into each respective transforaminal space.  The patient was observed for 90 seconds post injection.  After no sensory deficits were reported, and normal lower extremity motor function was noted,   the above injectate was administered so that equal amounts of the injectate were placed at each foramen (level) into the transforaminal epidural space.   Additional Comments:  No complications occurred Dressing: 2 x 2 sterile gauze and Band-Aid    Post-procedure details: Patient was observed during the procedure. Post-procedure instructions were reviewed.  Patient left the clinic in stable condition.

## 2023-06-19 NOTE — Progress Notes (Signed)
[name] - 82 y.o. female MRN [id number]  Date of birth: 1940-11-26  Office Visit Note: Visit Date: 06/19/2023 PCP: Donato Schultz, DO Referred by: Donato Schultz, *  Subjective: Chief Complaint  Patient presents with   Lower Back - Pain   HPI:  [name] is a 82 y.o. female who comes in today at the request of Ellin Goodie, FNP for planned Right L5-S1 Lumbar Transforaminal epidural steroid injection with fluoroscopic guidance.  The patient has failed conservative care including home exercise, medications, time and activity modification.  This injection will be diagnostic and hopefully therapeutic.  Please see requesting physician notes for further details and justification.   ROS Otherwise per HPI.  Assessment & Plan: Visit Diagnoses:    ICD-10-CM   1. Lumbar radiculopathy  M54.16 XR C-ARM NO REPORT    Epidural Steroid injection    methylPREDNISolone acetate (DEPO-MEDROL) injection 40 mg      Plan: No additional findings.   Meds & Orders:  Meds ordered this encounter  Medications   methylPREDNISolone acetate (DEPO-MEDROL) injection 40 mg    Orders Placed This Encounter  Procedures   XR C-ARM NO REPORT   Epidural Steroid injection    Follow-up: No follow-ups on file.   Procedures: No procedures performed  Lumbosacral Transforaminal Epidural Steroid Injection - Sub-Pedicular Approach with Fluoroscopic Guidance  Patient: [name]      Date of Birth: 30-Aug-1940 MRN: [id number] PCP: Donato Schultz, DO      Visit Date: 06/19/2023   Universal Protocol:    Date/Time: 06/19/2023  Consent Given By: the patient  Position: PRONE  Additional Comments: Vital signs were monitored before and after the procedure. Patient was prepped and draped in the usual sterile fashion. The correct patient, procedure, and site was verified.   Injection Procedure Details:   Procedure diagnoses: Lumbar radiculopathy [M54.16]    Meds  Administered:  Meds ordered this encounter  Medications   methylPREDNISolone acetate (DEPO-MEDROL) injection 40 mg    Laterality: Right  Location/Site: L5  Needle:5.0 in., 22 ga.  Short bevel or Quincke spinal needle  Needle Placement: Transforaminal  Findings:    -Comments: Excellent flow of contrast along the nerve, nerve root and into the epidural space.  Procedure Details: After squaring off the end-plates to get a true AP view, the C-arm was positioned so that an oblique view of the foramen as noted above was visualized. The target area is just inferior to the "nose of the scotty dog" or sub pedicular. The soft tissues overlying this structure were infiltrated with 2-3 ml. of 1% Lidocaine without Epinephrine.  The spinal needle was inserted toward the target using a "trajectory" view along the fluoroscope beam.  Under AP and lateral visualization, the needle was advanced so it did not puncture dura and was located close the 6 O'Clock position of the pedical in AP tracterory. Biplanar projections were used to confirm position. Aspiration was confirmed to be negative for CSF and/or blood. A 1-2 ml. volume of Isovue-250 was injected and flow of contrast was noted at each level. Radiographs were obtained for documentation purposes.   After attaining the desired flow of contrast documented above, a 0.5 to 1.0 ml test dose of 0.25% Marcaine was injected into each respective transforaminal space.  The patient was observed for 90 seconds post injection.  After no sensory deficits were reported, and normal lower extremity motor function was noted,   the above injectate was  administered so that equal amounts of the injectate were placed at each foramen (level) into the transforaminal epidural space.   Additional Comments:  No complications occurred Dressing: 2 x 2 sterile gauze and Band-Aid    Post-procedure details: Patient was observed during the procedure. Post-procedure instructions  were reviewed.  Patient left the clinic in stable condition.    Clinical History: CLINICAL DATA:  Spinal stenosis, lumbar. Low back and right hip pain. Previous kyphoplasty.   EXAM: CT LUMBAR SPINE WITHOUT CONTRAST   TECHNIQUE: Multidetector CT imaging of the lumbar spine was performed without intravenous contrast administration. Multiplanar CT image reconstructions were also generated.   RADIATION DOSE REDUCTION: This exam was performed according to the departmental dose-optimization program which includes automated exposure control, adjustment of the mA and/or kV according to patient size and/or use of iterative reconstruction technique.   COMPARISON:  CT 05/11/2015.  Radiography 01/06/2023.   FINDINGS: Segmentation: 5 lumbar type vertebral bodies as numbered previously.   Alignment: No significant malalignment. 2 mm degenerative anterolisthesis L5-S1.   Vertebrae: Old vertebral augmentations at T11, T12, L2, L4 and L5 as seen on the study of 2016. No new fracture or additional collapse. Fusion or near fusion of the facet joints on the right from L2-3 through L4-5.   Paraspinal and other soft tissues: Negative   Disc levels: No canal or foraminal stenosis from T10-11 through L3-4.   At L4-5, there is bulging of the disc. There is mild facet and ligamentous hypertrophy. Mild stenosis of both lateral recesses could possibly be symptomatic.   At L5-S1, the disc bulges mildly. There is bilateral facet osteoarthritis with 1-2 mm of anterolisthesis. No apparent compressive stenosis.   Sacroiliac osteoarthritis is noted, left worse than right. No evidence of sacral insufficiency fracture. Calcification associated with the right S2 nerve, which is chronic.   IMPRESSION: 1. Old vertebral augmentations at T11, T12, L2, L4 and L5 as seen on the study of 2016. No new fracture or additional collapse. 2. Fusion or near fusion of the facet joints on the right from  L2-3 through L4-5. 3. L4-5: Bulging of the disc. Mild facet and ligamentous hypertrophy. Mild stenosis of both lateral recesses could possibly be symptomatic. 4. L5-S1: Mild disc bulge. Bilateral facet osteoarthritis with 1-2 mm of anterolisthesis. No apparent compressive stenosis. 5. Sacroiliac osteoarthritis, left worse than right. No evidence of sacral insufficiency fracture.     Electronically Signed   By: Paulina Fusi M.D.   On: 05/12/2023 09:36     Objective:  VS:  HT:    WT:   BMI:     BP:   HR: bpm  TEMP: ( )  RESP:  Physical Exam Vitals and nursing note reviewed.  Constitutional:      General: She is not in acute distress.    Appearance: Normal appearance. She is not ill-appearing.  HENT:     Head: Normocephalic and atraumatic.     Right Ear: External ear normal.     Left Ear: External ear normal.  Eyes:     Extraocular Movements: Extraocular movements intact.  Cardiovascular:     Rate and Rhythm: Normal rate.     Pulses: Normal pulses.  Pulmonary:     Effort: Pulmonary effort is normal. No respiratory distress.  Abdominal:     General: There is no distension.     Palpations: Abdomen is soft.  Musculoskeletal:        General: Tenderness present.     Cervical back: Neck supple.  Right lower leg: No edema.     Left lower leg: No edema.     Comments: Patient has good distal strength with no pain over the greater trochanters.  No clonus or focal weakness.  Skin:    Findings: No erythema, lesion or rash.  Neurological:     General: No focal deficit present.     Mental Status: She is alert and oriented to person, place, and time.     Sensory: No sensory deficit.     Motor: No weakness or abnormal muscle tone.     Coordination: Coordination normal.  Psychiatric:        Mood and Affect: Mood normal.        Behavior: Behavior normal.      Imaging: No results found.

## 2023-06-29 ENCOUNTER — Other Ambulatory Visit: Payer: Self-pay | Admitting: Family Medicine

## 2023-06-29 DIAGNOSIS — M545 Low back pain, unspecified: Secondary | ICD-10-CM

## 2023-06-29 DIAGNOSIS — M546 Pain in thoracic spine: Secondary | ICD-10-CM

## 2023-06-30 ENCOUNTER — Other Ambulatory Visit (HOSPITAL_BASED_OUTPATIENT_CLINIC_OR_DEPARTMENT_OTHER): Payer: Self-pay

## 2023-06-30 MED ORDER — TRAMADOL HCL 50 MG PO TABS
50.0000 mg | ORAL_TABLET | Freq: Four times a day (QID) | ORAL | 0 refills | Status: DC | PRN
  Filled 2023-06-30: qty 60, 7d supply, fill #0

## 2023-07-03 ENCOUNTER — Telehealth: Payer: Self-pay

## 2023-07-03 NOTE — Telephone Encounter (Signed)
 Patient Assistance Form received for Eliquis.  Patient received Elquis assistance for 2024.  Faxed to Haze Rushing, Patient Assistance team member.

## 2023-07-04 ENCOUNTER — Telehealth: Payer: Self-pay

## 2023-07-04 NOTE — Telephone Encounter (Signed)
 [employer] PROVIDER FORM ON FAX

## 2023-07-04 NOTE — Telephone Encounter (Signed)
 Received, thank you

## 2023-07-07 NOTE — Telephone Encounter (Signed)
 Provider form has been faxed to [employer]. If patient requests an update in the meantime please refer them to [employer] at [phone]

## 2023-07-08 ENCOUNTER — Other Ambulatory Visit (HOSPITAL_BASED_OUTPATIENT_CLINIC_OR_DEPARTMENT_OTHER): Payer: Self-pay

## 2023-07-09 NOTE — Telephone Encounter (Signed)
 Fax received from [employer] that they need provider signature date. Provider signature date is dated from 2024, not 2025. Please fax updated provider form to [phone].

## 2023-07-10 NOTE — Telephone Encounter (Signed)
 Faxed provider signature form to [employer].

## 2023-07-14 ENCOUNTER — Other Ambulatory Visit (HOSPITAL_BASED_OUTPATIENT_CLINIC_OR_DEPARTMENT_OTHER): Payer: Self-pay

## 2023-07-14 ENCOUNTER — Other Ambulatory Visit: Payer: Self-pay | Admitting: Family Medicine

## 2023-07-14 ENCOUNTER — Other Ambulatory Visit: Payer: Self-pay

## 2023-07-14 DIAGNOSIS — Z889 Allergy status to unspecified drugs, medicaments and biological substances status: Secondary | ICD-10-CM

## 2023-07-14 MED ORDER — LORATADINE 10 MG PO TABS
10.0000 mg | ORAL_TABLET | Freq: Every day | ORAL | 1 refills | Status: DC | PRN
  Filled 2023-07-14: qty 100, 100d supply, fill #0
  Filled 2023-10-26: qty 100, 100d supply, fill #1

## 2023-07-15 ENCOUNTER — Other Ambulatory Visit (HOSPITAL_BASED_OUTPATIENT_CLINIC_OR_DEPARTMENT_OTHER): Payer: Self-pay

## 2023-07-15 ENCOUNTER — Encounter: Payer: Self-pay | Admitting: Family Medicine

## 2023-07-15 ENCOUNTER — Ambulatory Visit (INDEPENDENT_AMBULATORY_CARE_PROVIDER_SITE_OTHER): Payer: Medicare Other | Admitting: Family Medicine

## 2023-07-15 VITALS — BP 140/88 | HR 77 | Temp 97.6°F | Resp 20 | Ht 62.99 in | Wt 146.0 lb

## 2023-07-15 DIAGNOSIS — J0140 Acute pansinusitis, unspecified: Secondary | ICD-10-CM

## 2023-07-15 DIAGNOSIS — R051 Acute cough: Secondary | ICD-10-CM | POA: Diagnosis not present

## 2023-07-15 DIAGNOSIS — R6889 Other general symptoms and signs: Secondary | ICD-10-CM

## 2023-07-15 LAB — POC INFLUENZA A&B (BINAX/QUICKVUE)
Influenza A, POC: NEGATIVE
Influenza B, POC: NEGATIVE

## 2023-07-15 LAB — POC COVID19 BINAXNOW: SARS Coronavirus 2 Ag: NEGATIVE

## 2023-07-15 MED ORDER — AMOXICILLIN-POT CLAVULANATE 875-125 MG PO TABS
1.0000 | ORAL_TABLET | Freq: Two times a day (BID) | ORAL | 0 refills | Status: DC
  Filled 2023-07-15: qty 20, 10d supply, fill #0

## 2023-07-15 MED ORDER — PROMETHAZINE-DM 6.25-15 MG/5ML PO SYRP
5.0000 mL | ORAL_SOLUTION | Freq: Four times a day (QID) | ORAL | 0 refills | Status: DC | PRN
  Filled 2023-07-15: qty 118, 6d supply, fill #0

## 2023-07-15 NOTE — Progress Notes (Signed)
 9  Established Patient Office Visit  Subjective   Patient ID: RUTH KOVICH, female    DOB: 04/30/1941  Age: 83 y.o. MRN: [id number]  Chief Complaint  Patient presents with   Cough    Sxs started last Tuesday, pt states productive cough, feeling week, chills. No covid test    HPI Discussed the use of AI scribe software for clinical note transcription with the patient, who gave verbal consent to proceed.  History of Present Illness   The patient, with an unspecified medical history, presented with an acute onset of symptoms that began last Tuesday. She reported feeling melty with an itchy throat, body aches, and nasal congestion. By Wednesday morning, she had lost her voice. The patient was unsure about having a fever due to the lack of a thermometer at home.  The patient described significant nasal congestion and a productive cough, especially prominent at night and in the morning. She noted the presence of phlegm, sometimes blood-tinged, which she attributed to her nose. Over-the-counter medications, including Coricidin and Airborne, were taken for symptom relief, along with natural remedies such as tea, chicken soup, and vitamin C.  The patient also mentioned a recent family gathering for a first birthday party, where one of the babies had a runny nose. She fell ill on the Tuesday following the event. The patient also reported a recent move to an apartment and difficulties with icy conditions on the sidewalks, leading to two slips while trying to reach her car.  The patient was swabbed for flu and COVID during the visit, but the results were not discussed in the conversation. The patient was also on a regular rotation with Flonase  for her nasal symptoms. The patient's other medications or medical conditions were not specified in the conversation.      Patient Active Problem List   Diagnosis Date Noted   Acute non-recurrent pansinusitis 07/16/2023   Nocturia 04/16/2023   Feeling of  incomplete bladder emptying 04/16/2023   Pelvic pain 04/16/2023   Radiculopathy, lumbar region 04/01/2023   Frequency of urination 03/11/2023   Need for influenza vaccination 03/11/2023   Degenerative disc disease, lumbar 01/06/2023   Neck pain 11/08/2022   Degenerative disc disease, cervical 11/08/2022   Acute bilateral low back pain with bilateral sciatica 04/25/2022   History of vertebral compression fracture 04/25/2022   Non-recurrent acute serous otitis media of right ear 09/02/2021   Chronic diastolic heart failure (HCC) 07/04/2021   Aortic ectasia, unspecified site (HCC) 07/04/2021   SOB (shortness of breath) 06/15/2021   Pulmonary nodule 06/15/2021   Hypokalemia 10/02/2020   Neuropathy 10/02/2020   Elevated serum creatinine 10/02/2020   AKI (acute kidney injury) (HCC) 09/15/2020   Left-sided chest pain    Congestion of nasal sinus    Substernal chest pain 09/13/2020   Spider veins 04/05/2020   Varicose veins of bilateral lower extremities with other complications 04/05/2020   H/O seasonal allergies 04/05/2020   Numbness and tingling of both feet 12/23/2019   Paroxysmal atrial fibrillation (HCC) 07/22/2019   V-tach (HCC) 07/22/2019   Lipoma of left upper extremity 03/11/2019   Hordeolum externum of left upper eyelid 02/15/2019   Hyponatremia 02/15/2019   Hyperlipidemia 06/03/2017   Renal mass 03/20/2016   Fecal impaction (HCC) 10/16/2013   Vertebral compression fracture (HCC) 10/16/2013   Constipation 10/15/2013   Abdominal pain 10/15/2013   PUD (peptic ulcer disease) 10/15/2013   Low back pain with radiation 10/15/2013   Hypothyroidism, 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 08/2011 Upgrade of PPM to MDT Adapta L Dual Chamber PPM ser # [id number].   Cardiac pacemaker in situ    Contact dermatitis and eczema    unspec cause   Dairy product intolerance    Depression    Diastolic dysfunction    GERD (gastroesophageal reflux disease)    Hiatal hernia    History of diverticulitis of colon    2003  perforated diverticulitis w/ surgical intervention   History of kidney stones    age 35   History of peripheral edema    lower extremities   History of thyroid  nodule    multinodular goiter  s/p  total thyroidectomy 2014   HTN (hypertension)    Hydronephrosis, left    Hypothyroidism    Mild sleep apnea    per study 04-14-2014   Nocturia    NSVT (nonsustained ventricular tachycardia) (HCC)    Osteoporosis    PAF (paroxysmal atrial fibrillation) (HCC)    PONV (postoperative nausea and vomiting)    Pulmonary nodules    stable per last ct   Renal cell carcinoma of left kidney (HCC)    Superficial thrombophlebitis    Past Surgical History:  Procedure Laterality Date   CARDIOVASCULAR STRESS TEST  04-20-2015  dr fernande   normal nuclear study/  no ischemia/  normal LV function and wall motion, ef 74%   COLONOSCOPY  01/19/2013   COLOSTOMY TAKEDOWN  07-22-2002   CYSTOSCOPY WITH RETROGRADE PYELOGRAM, URETEROSCOPY AND STENT PLACEMENT Left 07/28/2015   Procedure: CYSTOSCOPY WITH LEFT RETROGRADE PYELOGRAM, POSSIBLE LEFT URETEROSCOPY AND STENT PLACEMENT;  Surgeon: Garnette Shack, MD;  Location: [facility];  Service: Urology;  Laterality: Left;   EXPLORATORY LAPARTOMY SIGMOID COLECTOMY/ COLOSTOMY  04-28-2002   perforated  diverticulitis   KYPHOPLASTY     LAPAROSCOPY VENTRAL HERNIA REPAIR/  EXTENSIVE LYSIS ADHESIONS  05-04-2004   PACEMAKER GENERATOR CHANGE Left 09/16/2011   MDT ADDRL1 pacemaker   PACEMAKER PLACEMENT  05-11-2003     medtronic   ROBOT ASSISTED LAPAROSCOPIC NEPHRECTOMY Left 03/20/2016   Procedure: XI ROBOTIC ASSISTED LAPAROSCOPIC RETROPERITONEAL NEPHRECTOMY;  Surgeon: Ricardo Likens, MD;  Location: [facility];  Service: Urology;  Laterality: Left;   THYROIDECTOMY N/A 08/07/2012   Procedure: THYROIDECTOMY;  Surgeon: Krystal CHRISTELLA Spinner, MD;  Location: [facility];  Service: General;  Laterality: N/A;   TRANSTHORACIC ECHOCARDIOGRAM  10-12-2014   moderate focal basal LVH,  ef 55-60%,  grade 2 diastolic dysfunction, mild AV calcification without stenosis,  mild MR,  trivial PR   Social History   Tobacco Use   Smoking status: Former    Current packs/day: 0.00    Types: Cigarettes    Start date: 10/05/1964    Quit date: 10/06/1979    Years since quitting: 43.8   Smokeless tobacco: Never  Vaping Use   Vaping status: Never Used  Substance Use Topics   Alcohol use: No   Drug use:  No   Social History   Socioeconomic History   Marital status: Single    Spouse name: Not on file   Number of children: 2   Years of education: 16   Highest education level: Not on file  Occupational History   Occupation: Retired nurse, adult for the [employer]    Employer: RETIRED  Tobacco Use   Smoking status: Former    Current packs/day: 0.00    Types: Cigarettes    Start date: 10/05/1964    Quit date: 10/06/1979    Years since quitting: 43.8   Smokeless tobacco: Never  Vaping Use   Vaping status: Never Used  Substance and Sexual Activity   Alcohol use: No   Drug use: No   Sexual activity: Not Currently  Other Topics Concern   Not on file  Social History Narrative   The patient is a buyer, retail of [employer].  She worked for the [employer] in [employer].  She was married for 22 years,  divorced, and has remained single. She has 1 grown son, 1 grown daughter.  She has 3 grandchildren, 2 of her children   live in [place].  She is retired.  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 She had a long term companion, 32 years, who passed away 06/16/11.   Social Drivers of Corporate Investment Banker Strain: Low Risk  (03/21/2021)   Overall Financial Resource Strain (CARDIA)    Difficulty of Paying Living Expenses: Not hard at all  Food Insecurity: No Food Insecurity (03/08/2022)   Hunger Vital Sign    Worried About Running Out of Food in the Last Year: Never true    Ran Out of Food in the Last Year: Never true  Transportation Needs: No Transportation Needs (03/08/2022)   PRAPARE - Administrator, Civil Service (Medical): No    Lack of Transportation (Non-Medical): No  Physical Activity: Insufficiently Active (03/21/2021)   Exercise Vital Sign    Days of Exercise per Week: 4 days    Minutes of Exercise per Session: 30 min  Stress: No Stress Concern Present (03/21/2021)   [employer] of Occupational Health - Occupational Stress Questionnaire    Feeling of Stress : Not at all  Social Connections: Socially Isolated (03/21/2021)   Social Connection and Isolation Panel [NHANES]    Frequency of Communication with Friends and Family: More than three times a week    Frequency of Social Gatherings with Friends and Family: More than three times a week    Attends Religious Services: Never    Database Administrator or Organizations: No    Attends Banker Meetings: Never    Marital Status: Divorced  Catering Manager Violence: Not At Risk (03/21/2021)   Humiliation, Afraid, Rape, and Kick questionnaire    Fear of Current or Ex-Partner: No    Emotionally Abused: No    Physically Abused: No    Sexually Abused: No   Family Status  Relation Name Status   Mother  Deceased   Father  Deceased   Sister  (Not Specified)   Brother  Deceased at age  80       MI   Brother  (Not Specified)   Son  (Not Specified)   Neg Hx  (Not Specified)  No partnership data on file   Family History  Problem Relation Age of Onset   Heart disease Mother  Stent in her 51s   Stroke Mother    Hypertension Mother    Heart failure Father    Diverticulosis Sister    Hypertension Brother    Heart disease Brother    Hypertension Brother    Heart attack Neg Hx    Allergies  Allergen Reactions   Tape Rash   Codeine Nausea And Vomiting    ROS    Objective:     BP (!) 140/88 (BP Location: Left Arm, Patient Position: Sitting, Cuff Size: Normal)   Pulse 77   Temp 97.6 F (36.4 C) (Oral)   Resp 20   Ht 5' 2.99 (1.6 m)   Wt 146 lb (66.2 kg)   SpO2 95%   BMI 25.87 kg/m  BP Readings from Last 3 Encounters:  07/15/23 (!) 140/88  05/27/23 138/80  04/15/23 127/80   Wt Readings from Last 3 Encounters:  07/15/23 146 lb (66.2 kg)  05/27/23 145 lb 12.8 oz (66.1 kg)  04/15/23 145 lb (65.8 kg)   SpO2 Readings from Last 3 Encounters:  07/15/23 95%  05/27/23 98%  03/11/23 97%      Physical Exam   Results for orders placed or performed in visit on 07/15/23  POC COVID-19  Result Value Ref Range   SARS Coronavirus 2 Ag Negative Negative  POC Influenza A&B (Binax test)  Result Value Ref Range   Influenza A, POC Negative Negative   Influenza B, POC Negative Negative    Last CBC Lab Results  Component Value Date   WBC 5.4 01/06/2023   HGB 12.6 01/06/2023   HCT 39.2 01/06/2023   MCV 83.9 01/06/2023   MCH 27.5 06/06/2021   RDW 13.4 01/06/2023   PLT 177.0 01/06/2023   Last metabolic panel Lab Results  Component Value Date   GLUCOSE 90 01/06/2023   NA 134 (L) 01/06/2023   K 4.6 01/06/2023   CL 96 01/06/2023   CO2 29 01/06/2023   BUN 33 (H) 01/06/2023   CREATININE 1.02 01/06/2023   GFR 50.55 (L) 01/06/2023   CALCIUM  10.1 01/06/2023   PROT 7.0 01/06/2023   ALBUMIN 4.6 01/06/2023   BILITOT 0.5 01/06/2023   ALKPHOS 61  01/06/2023   AST 24 01/06/2023   ALT 16 01/06/2023   ANIONGAP 11 06/06/2021   Last lipids Lab Results  Component Value Date   CHOL 172 01/06/2023   HDL 47.20 01/06/2023   LDLCALC 107 (H) 01/06/2023   TRIG 89.0 01/06/2023   CHOLHDL 4 01/06/2023   Last hemoglobin A1c Lab Results  Component Value Date   HGBA1C 5.5 06/07/2016   Last thyroid  functions Lab Results  Component Value Date   TSH 4.19 01/06/2023   T4TOTAL 12.3 03/10/2012   Last vitamin D  Lab Results  Component Value Date   VD25OH 78 05/27/2023   Last vitamin B12 and Folate Lab Results  Component Value Date   VITAMINB12 517 12/11/2020      The ASCVD Risk score (Arnett DK, et al., 2019) failed to calculate for the following reasons:   The 2019 ASCVD risk score is only valid for ages 12 to 65    Assessment & Plan:   Problem List Items Addressed This Visit       Unprioritized   Acute non-recurrent pansinusitis - Primary   Abx per orders Con't flonase  and claritin       Relevant Medications   promethazine -dextromethorphan (PROMETHAZINE -DM) 6.25-15 MG/5ML syrup   amoxicillin -clavulanate (AUGMENTIN ) 875-125 MG tablet   Other Visit Diagnoses  Acute cough       Relevant Medications   promethazine -dextromethorphan (PROMETHAZINE -DM) 6.25-15 MG/5ML syrup   Other Relevant Orders   POC COVID-19 (Completed)     Flu-like symptoms       Relevant Orders   POC Influenza A&B (Binax test) (Completed)     Assessment and Plan    Upper Respiratory Infection Acute sore throat, body aches, nasal congestion, and loss of voice have been present since last Tuesday. Tests for COVID-19 and influenza are negative. Symptoms worsen at night and in the morning. Currently using Coricidin, Airborne, and Flonase . No fever reported due to lack of thermometer. Continue Flonase . Advise symptomatic relief with tea, chicken soup, and vitamin C.  General Health Maintenance Mobility is difficult due to back issues and concerns  about slipping on icy sidewalks. Emphasize the importance of maintaining safety and accessibility in her living environment. Advise using ice melt for sidewalks and ensure the handicap parking spot is utilized.        Return if symptoms worsen or fail to improve.    Chatham Howington R Lowne Chase, DO

## 2023-07-16 DIAGNOSIS — J0140 Acute pansinusitis, unspecified: Secondary | ICD-10-CM | POA: Insufficient documentation

## 2023-07-16 NOTE — Patient Instructions (Signed)

## 2023-07-16 NOTE — Assessment & Plan Note (Signed)
 Abx per orders Con't flonase  and claritin 

## 2023-07-18 ENCOUNTER — Telehealth: Payer: Self-pay | Admitting: Pharmacy Technician

## 2023-07-18 NOTE — Telephone Encounter (Signed)
New Encounter created for decision. For additional info see Pharmacy medication assitance telephone encounter from 07/18/23.

## 2023-07-18 NOTE — Telephone Encounter (Signed)
PAP: Patient has been denied for pt assistance by [employer] ([employer]) due to patient above income threshold for program

## 2023-07-18 NOTE — Telephone Encounter (Signed)
Spoke with patient and she is aware of [employer] denial letter. She verbalized undertsanding

## 2023-07-23 ENCOUNTER — Encounter: Payer: Self-pay | Admitting: Pharmacy Technician

## 2023-07-23 NOTE — Telephone Encounter (Signed)
I sent the patient a mychart message to let her know. Thank you

## 2023-07-23 NOTE — Telephone Encounter (Signed)
PAP: Patient assistance application Eliquis for has been approved by PAP Companies: [employer] from 07/23/23 to 06/30/24. Medication should be delivered to PAP Delivery: Home For further shipping updates, please contact [employer] ([employer]) at [phone] Pt ID is: [id number]    APPLICATION IS NOW APPROVED

## 2023-07-25 ENCOUNTER — Ambulatory Visit: Payer: Medicare Other

## 2023-07-29 ENCOUNTER — Ambulatory Visit (INDEPENDENT_AMBULATORY_CARE_PROVIDER_SITE_OTHER): Payer: Medicare Other | Admitting: *Deleted

## 2023-07-29 DIAGNOSIS — Z0000 Encounter for general adult medical examination without abnormal findings: Secondary | ICD-10-CM

## 2023-07-29 NOTE — Patient Instructions (Addendum)
[name] , Thank you for taking time to come for your Medicare Wellness Visit. I appreciate your ongoing commitment to your health goals. Please review the following plan we discussed and let me know if I can assist you in the future.     This is a list of the screening recommended for you and due dates:  Health Maintenance  Topic Date Due   DTaP/Tdap/Td vaccine (1 - Tdap) Never done   Zoster (Shingles) Vaccine (1 of 2) Never done   COVID-19 Vaccine (4 - 2024-25 season) 03/02/2023   Medicare Annual Wellness Visit  07/28/2024   Pneumonia Vaccine  Completed   Flu Shot  Completed   DEXA scan (bone density measurement)  Completed   HPV Vaccine  Aged Out    Next appointment: Follow up in one year for your annual wellness visit.   Preventive Care 85 Years and Older, Female Preventive care refers to lifestyle choices and visits with your health care provider that can promote health and wellness. What does preventive care include? A yearly physical exam. This is also called an annual well check. Dental exams once or twice a year. Routine eye exams. Ask your health care provider how often you should have your eyes checked. Personal lifestyle choices, including: Daily care of your teeth and gums. Regular physical activity. Eating a healthy diet. Avoiding tobacco and drug use. Limiting alcohol use. Practicing safe sex. Taking low-dose aspirin every day. Taking vitamin and mineral supplements as recommended by your health care provider. What happens during an annual well check? The services and screenings done by your health care provider during your annual well check will depend on your age, overall health, lifestyle risk factors, and family history of disease. Counseling  Your health care provider may ask you questions about your: Alcohol use. Tobacco use. Drug use. Emotional well-being. Home and relationship well-being. Sexual activity. Eating habits. History of falls. Memory  and ability to understand (cognition). Work and work Astronomer. Reproductive health. Screening  You may have the following tests or measurements: Height, weight, and BMI. Blood pressure. Lipid and cholesterol levels. These may be checked every 5 years, or more frequently if you are over 57 years old. Skin check. Lung cancer screening. You may have this screening every year starting at age 22 if you have a 30-pack-year history of smoking and currently smoke or have quit within the past 15 years. Fecal occult blood test (FOBT) of the stool. You may have this test every year starting at age 29. Flexible sigmoidoscopy or colonoscopy. You may have a sigmoidoscopy every 5 years or a colonoscopy every 10 years starting at age 78. Hepatitis C blood test. Hepatitis B blood test. Sexually transmitted disease (STD) testing. Diabetes screening. This is done by checking your blood sugar (glucose) after you have not eaten for a while (fasting). You may have this done every 1-3 years. Bone density scan. This is done to screen for osteoporosis. You may have this done starting at age 32. Mammogram. This may be done every 1-2 years. Talk to your health care provider about how often you should have regular mammograms. Talk with your health care provider about your test results, treatment options, and if necessary, the need for more tests. Vaccines  Your health care provider may recommend certain vaccines, such as: Influenza vaccine. This is recommended every year. Tetanus, diphtheria, and acellular pertussis (Tdap, Td) vaccine. You may need a Td booster every 10 years. Zoster vaccine. You may need this after age  60. Pneumococcal 13-valent conjugate (PCV13) vaccine. One dose is recommended after age 53. Pneumococcal polysaccharide (PPSV23) vaccine. One dose is recommended after age 29. Talk to your health care provider about which screenings and vaccines you need and how often you need them. This  information is not intended to replace advice given to you by your health care provider. Make sure you discuss any questions you have with your health care provider. Document Released: 07/14/2015 Document Revised: 03/06/2016 Document Reviewed: 04/18/2015 Elsevier Interactive Patient Education  2017 [employer].  Fall Prevention in the Home Falls can cause injuries. They can happen to people of all ages. There are many things you can do to make your home safe and to help prevent falls. What can I do on the outside of my home? Regularly fix the edges of walkways and driveways and fix any cracks. Remove anything that might make you trip as you walk through a door, such as a raised step or threshold. Trim any bushes or trees on the path to your home. Use bright outdoor lighting. Clear any walking paths of anything that might make someone trip, such as rocks or tools. Regularly check to see if handrails are loose or broken. Make sure that both sides of any steps have handrails. Any raised decks and porches should have guardrails on the edges. Have any leaves, snow, or ice cleared regularly. Use sand or salt on walking paths during winter. Clean up any spills in your garage right away. This includes oil or grease spills. What can I do in the bathroom? Use night lights. Install grab bars by the toilet and in the tub and shower. Do not use towel bars as grab bars. Use non-skid mats or decals in the tub or shower. If you need to sit down in the shower, use a plastic, non-slip stool. Keep the floor dry. Clean up any water that spills on the floor as soon as it happens. Remove soap buildup in the tub or shower regularly. Attach bath mats securely with double-sided non-slip rug tape. Do not have throw rugs and other things on the floor that can make you trip. What can I do in the bedroom? Use night lights. Make sure that you have a light by your bed that is easy to reach. Do not use any sheets or  blankets that are too big for your bed. They should not hang down onto the floor. Have a firm chair that has side arms. You can use this for support while you get dressed. Do not have throw rugs and other things on the floor that can make you trip. What can I do in the kitchen? Clean up any spills right away. Avoid walking on wet floors. Keep items that you use a lot in easy-to-reach places. If you need to reach something above you, use a strong step stool that has a grab bar. Keep electrical cords out of the way. Do not use floor polish or wax that makes floors slippery. If you must use wax, use non-skid floor wax. Do not have throw rugs and other things on the floor that can make you trip. What can I do with my stairs? Do not leave any items on the stairs. Make sure that there are handrails on both sides of the stairs and use them. Fix handrails that are broken or loose. Make sure that handrails are as long as the stairways. Check any carpeting to make sure that it is firmly attached to the stairs. Fix  any carpet that is loose or worn. Avoid having throw rugs at the top or bottom of the stairs. If you do have throw rugs, attach them to the floor with carpet tape. Make sure that you have a light switch at the top of the stairs and the bottom of the stairs. If you do not have them, ask someone to add them for you. What else can I do to help prevent falls? Wear shoes that: Do not have high heels. Have rubber bottoms. Are comfortable and fit you well. Are closed at the toe. Do not wear sandals. If you use a stepladder: Make sure that it is fully opened. Do not climb a closed stepladder. Make sure that both sides of the stepladder are locked into place. Ask someone to hold it for you, if possible. Clearly mark and make sure that you can see: Any grab bars or handrails. First and last steps. Where the edge of each step is. Use tools that help you move around (mobility aids) if they are  needed. These include: Canes. Walkers. Scooters. Crutches. Turn on the lights when you go into a dark area. Replace any light bulbs as soon as they burn out. Set up your furniture so you have a clear path. Avoid moving your furniture around. If any of your floors are uneven, fix them. If there are any pets around you, be aware of where they are. Review your medicines with your doctor. Some medicines can make you feel dizzy. This can increase your chance of falling. Ask your doctor what other things that you can do to help prevent falls. This information is not intended to replace advice given to you by your health care provider. Make sure you discuss any questions you have with your health care provider. Document Released: 04/13/2009 Document Revised: 11/23/2015 Document Reviewed: 07/22/2014 Elsevier Interactive Patient Education  2017 [employer].

## 2023-07-29 NOTE — Progress Notes (Signed)
Subjective:   [name] is a 83 y.o. female who presents for Medicare Annual (Subsequent) preventive examination.  Visit Complete: Virtual I connected with  CIARA KAGAN on 07/29/23 by a audio enabled telemedicine application and verified that I am speaking with the correct person using two identifiers.  Patient Location: Home  Provider Location: Office/Clinic  I discussed the limitations of evaluation and management by telemedicine. The patient expressed understanding and agreed to proceed.  Vital Signs: Because this visit was a virtual/telehealth visit, some criteria may be missing or patient reported. Any vitals not documented were not able to be obtained and vitals that have been documented are patient reported.  Patient Medicare AWV questionnaire was completed by the patient on 07/28/23; I have confirmed that all information answered by patient is correct and no changes since this date.  Cardiac Risk Factors include: advanced age (>6men, >55 women);dyslipidemia;hypertension     Objective:    There were no vitals filed for this visit. There is no height or weight on file to calculate BMI.     07/29/2023   11:05 AM 04/11/2022    1:15 PM 06/06/2021    9:59 AM 03/21/2021    3:40 PM 09/13/2020   11:59 AM 07/25/2016   11:38 AM 06/03/2016    9:36 AM  Advanced Directives  Does Patient Have a Medical Advance Directive? No No No No No No No  Would patient like information on creating a medical advance directive? No - Patient declined No - Patient declined No - Patient declined Yes (MAU/Ambulatory/Procedural Areas - Information given) No - Patient declined No - Patient declined No - Patient declined    Current Medications (verified) Outpatient Encounter Medications as of 07/29/2023  Medication Sig   acetaminophen (TYLENOL) 500 MG tablet Take 1,000 mg by mouth daily as needed for moderate pain.   amLODipine (NORVASC) 10 MG tablet Take 1 tablet by mouth once daily   apixaban  (ELIQUIS) 5 MG TABS tablet Take 1 tablet (5 mg total) by mouth 2 (two) times daily.   cyclobenzaprine (FLEXERIL) 5 MG tablet Take 1 tablet (5 mg total) by mouth 3 (three) times daily as needed for muscle spasms.   denosumab (PROLIA) 60 MG/ML SOSY injection Inject 60 mg into the skin every 6 (six) months.   diclofenac Sodium (VOLTAREN ARTHRITIS PAIN) 1 % GEL Apply 4 g topically 4 (four) times daily.   diphenhydramine-acetaminophen (TYLENOL PM) 25-500 MG TABS tablet Take 2 tablets by mouth at bedtime as needed (sleep/pain).   fluticasone (FLONASE) 50 MCG/ACT nasal spray Place 2 sprays into both nostrils daily as needed for allergies or rhinitis.   hydrochlorothiazide (MICROZIDE) 12.5 MG capsule Take 1 capsule by mouth once daily   levocetirizine (XYZAL) 5 MG tablet Take 1 tablet (5 mg total) by mouth every evening.   levothyroxine (SYNTHROID) 100 MCG tablet TAKE 1 TABLET BY MOUTH ONCE DAILY . APPOINTMENT REQUIRED FOR FUTURE REFILLS   lisinopril (ZESTRIL) 40 MG tablet Take 1 tablet (40 mg total) by mouth daily.   loratadine (ALLERGY RELIEF, LORATADINE,) 10 MG tablet Take 1 tablet (10 mg total) by mouth daily as needed for allergies or rhinitis.   traMADol (ULTRAM) 50 MG tablet Take 1-2 tablets (50-100 mg total) by mouth every 6 (six) hours as needed for pain.   [DISCONTINUED] amoxicillin-clavulanate (AUGMENTIN) 875-125 MG tablet Take 1 tablet by mouth 2 (two) times daily.   [DISCONTINUED] promethazine-dextromethorphan (PROMETHAZINE-DM) 6.25-15 MG/5ML syrup Take 5 mLs by mouth 4 (four) times daily  as needed.   Facility-Administered Encounter Medications as of 07/29/2023  Medication   aminophylline injection 150 mg   [START ON 11/24/2023] denosumab (PROLIA) injection 60 mg    Allergies (verified) Tape and Codeine   History: Past Medical History:  Diagnosis Date   Allergy    Atrioventricular block, complete (HCC)    a. 08/2011 Upgrade of PPM to MDT Adapta L Dual Chamber PPM ser # [id number].    Cardiac pacemaker in situ    Contact dermatitis and eczema    unspec cause   Dairy product intolerance    Depression    Diastolic dysfunction    GERD (gastroesophageal reflux disease)    Hiatal hernia    History of diverticulitis of colon    2003  perforated diverticulitis w/ surgical intervention   History of kidney stones    age 89   History of peripheral edema    lower extremities   History of thyroid nodule    multinodular goiter  s/p  total thyroidectomy 2014   HTN (hypertension)    Hydronephrosis, left    Hypothyroidism    Mild sleep apnea    per study 04-14-2014   Nocturia    NSVT (nonsustained ventricular tachycardia) (HCC)    Osteoporosis    PAF (paroxysmal atrial fibrillation) (HCC)    PONV (postoperative nausea and vomiting)    Pulmonary nodules    stable per last ct   Renal cell carcinoma of left kidney (HCC)    Superficial thrombophlebitis    Past Surgical History:  Procedure Laterality Date   CARDIOVASCULAR STRESS TEST  04-20-2015  dr Graciela Husbands   normal nuclear study/  no ischemia/  normal LV function and wall motion, ef 74%   COLON SURGERY  2003   COLONOSCOPY  01/19/2013   COLOSTOMY TAKEDOWN  07/22/2002   CYSTOSCOPY WITH RETROGRADE PYELOGRAM, URETEROSCOPY AND STENT PLACEMENT Left 07/28/2015   Procedure: CYSTOSCOPY WITH LEFT RETROGRADE PYELOGRAM, POSSIBLE LEFT URETEROSCOPY AND STENT PLACEMENT;  Surgeon: Marcine Matar, MD;  [facility];  Service: Urology;  Laterality: Left;   EXPLORATORY LAPARTOMY SIGMOID COLECTOMY/ COLOSTOMY  04/28/2002   perforated diverticulitis   KYPHOPLASTY     LAPAROSCOPY VENTRAL HERNIA REPAIR/  EXTENSIVE LYSIS ADHESIONS  05/04/2004   PACEMAKER GENERATOR CHANGE Left 09/16/2011   MDT ADDRL1 pacemaker   PACEMAKER PLACEMENT  05/11/2003   medtronic   ROBOT ASSISTED LAPAROSCOPIC NEPHRECTOMY Left 03/20/2016   Procedure: XI ROBOTIC ASSISTED LAPAROSCOPIC RETROPERITONEAL NEPHRECTOMY;  Surgeon: Sebastian Ache, MD;   Location: [facility];  Service: Urology;  Laterality: Left;   THYROIDECTOMY N/A 08/07/2012   Procedure: THYROIDECTOMY;  Surgeon: Velora Heckler, MD;  Location: [facility];  Service: General;  Laterality: N/A;   TRANSTHORACIC ECHOCARDIOGRAM  10/12/2014   moderate focal basal LVH,  ef 55-60%,  grade 2 diastolic dysfunction, mild AV calcification without stenosis,  mild MR,  trivial PR   Family History  Problem Relation Age of Onset   Heart disease Mother        Stent in her 77s   Stroke Mother    Hypertension Mother    Varicose Veins Mother    Heart failure Father    Diverticulosis Sister    Hypertension Brother    Heart disease Brother    Hypertension Brother    Heart attack Neg Hx    Social History   Socioeconomic History   Marital status: Single    Spouse name: Not on file   Number of children: 2   Years  of education: 16   Highest education level: Not on file  Occupational History   Occupation: Retired Nurse, adult for the [employer]    Employer: RETIRED  Tobacco Use   Smoking status: Former    Current packs/day: 0.00    Types: Cigarettes    Start date: 10/05/1964    Quit date: 10/06/1979    Years since quitting: 43.8   Smokeless tobacco: Never  Vaping Use   Vaping status: Never Used  Substance and Sexual Activity   Alcohol use: No   Drug use: No   Sexual activity: Not Currently    Birth control/protection: None  Other Topics Concern   Not on file  Social History Narrative   The patient is a Buyer, retail of [employer].  She worked for the [employer] in [employer].  She was married for 22 years, divorced, and has remained single. She has 1 grown son, 1 grown daughter.  She has 3 grandchildren, 2 of her children   live in [place].  She is retired.  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 She had a long term companion, 32 years, who passed away 08-28-2010.   Social Drivers of Corporate investment banker Strain: Low Risk   (07/28/2023)   Overall Financial Resource Strain (CARDIA)    Difficulty of Paying Living Expenses: Not very hard  Food Insecurity: No Food Insecurity (07/28/2023)   Hunger Vital Sign    Worried About Running Out of Food in the Last Year: Never true    Ran Out of Food in the Last Year: Never true  Transportation Needs: No Transportation Needs (07/28/2023)   PRAPARE - Administrator, Civil Service (Medical): No    Lack of Transportation (Non-Medical): No  Physical Activity: Insufficiently Active (07/28/2023)   Exercise Vital Sign    Days of Exercise per Week: 2 days    Minutes of Exercise per Session: 20 min  Stress: No Stress Concern Present (07/28/2023)   [employer] of Occupational Health - Occupational Stress Questionnaire    Feeling of Stress : Not at all  Social Connections: Unknown (07/28/2023)   Social Connection and Isolation Panel [NHANES]    Frequency of Communication with Friends and Family: More than three times a week    Frequency of Social Gatherings with Friends and Family: Once a week    Attends Religious Services: Patient declined    Database administrator or Organizations: No    Attends Engineer, structural: Never    Marital Status: Divorced    Tobacco Counseling Counseling given: Not Answered   Clinical Intake:  Pre-visit preparation completed: Yes  Pain : No/denies pain  Nutritional Risks: None Diabetes: No  How often do you need to have someone help you when you read instructions, pamphlets, or other written materials from your doctor or pharmacy?: 1 - Never  Interpreter Needed?: No  Information entered by :: [employer], CMA   Activities of Daily Living    07/29/2023   11:06 AM  In your present state of health, do you have any difficulty performing the following activities:  Hearing? 1  Comment very slight hearing loss  Vision? 0  Difficulty concentrating or making decisions? 0  Walking or climbing stairs? 0  Dressing  or bathing? 0  Doing errands, shopping? 0  Preparing Food and eating ? N  Using the Toilet? N  In the past six months, have  you accidently leaked urine? N  Do you have problems with loss of bowel control? N  Managing your Medications? N  Managing your Finances? N  Housekeeping or managing your Housekeeping? N    Patient Care Team: Zola Button, Grayling Congress, DO as PCP - General (Family Medicine) Duke Salvia, MD as PCP - Cardiology (Cardiology) Berneice Heinrich Delbert Phenix., MD as Consulting Physician (Urology) Duke Salvia, MD as Consulting Physician (Cardiology) Bernette Redbird, MD as Consulting Physician (Gastroenterology)  Indicate any recent Medical Services you may have received from other than [facility] providers in the past year (date may be approximate).     Assessment:   This is a routine wellness examination for Shavawn.  Hearing/Vision screen No results found.   Goals Addressed   None    Depression Screen    07/29/2023   11:16 AM 10/17/2022    1:05 PM 04/11/2022    1:17 PM 03/21/2021    3:44 PM 06/03/2016    8:47 AM 02/28/2015   10:45 AM 11/12/2013    1:48 PM  PHQ 2/9 Scores  PHQ - 2 Score 0 0 0 0 0 0 0    Fall Risk    07/29/2023   11:11 AM 10/17/2022    1:05 PM 04/11/2022    1:17 PM 03/21/2021    3:43 PM 09/13/2020   10:23 AM  Fall Risk   Falls in the past year? 0 0 0 0 0  Number falls in past yr: 0 0 0 0 0  Injury with Fall? 0 0 0 0 0  Risk for fall due to : No Fall Risks No Fall Risks No Fall Risks  No Fall Risks  Follow up Falls evaluation completed Falls evaluation completed Falls evaluation completed Falls prevention discussed Falls evaluation completed    MEDICARE RISK AT HOME: Medicare Risk at Home Any stairs in or around the home?: No If so, are there any without handrails?: No Home free of loose throw rugs in walkways, pet beds, electrical cords, etc?: Yes Adequate lighting in your home to reduce risk of falls?: Yes Life alert?: No Use of a cane,  walker or w/c?: No Grab bars in the bathroom?: No Shower chair or bench in shower?: No Elevated toilet seat or a handicapped toilet?: No  TIMED UP AND GO:  Was the test performed?  No    Cognitive Function:    06/03/2016    9:39 AM  MMSE - Mini Mental State Exam  Orientation to time 5  Orientation to Place 5  Registration 3  Attention/ Calculation 5  Recall 3  Language- name 2 objects 2  Language- repeat 1  Language- follow 3 step command 3  Language- read & follow direction 1  Write a sentence 1  Copy design 1  Total score 30        07/29/2023   11:28 AM 04/11/2022    1:31 PM  6CIT Screen  What Year? 0 points 0 points  What month? 0 points 0 points  What time? 0 points 0 points  Count back from 20 0 points 0 points  Months in reverse 0 points 0 points  Repeat phrase 0 points 0 points  Total Score 0 points 0 points    Immunizations Immunization History  Administered Date(s) Administered   Fluad Quad(high Dose 65+) 03/26/2019, 04/03/2020, 03/27/2021, 04/11/2022   Fluad Trivalent(High Dose 65+) 03/11/2023   Influenza Split 04/10/2011, 03/10/2012   Influenza Whole 03/24/2008, 04/11/2009   Influenza, High Dose Seasonal  PF 03/28/2017   Influenza,inj,Quad PF,6+ Mos 03/24/2013, 03/24/2014, 04/04/2015, 03/21/2016   Influenza-Unspecified 03/11/2018   PFIZER(Purple Top)SARS-COV-2 Vaccination 08/09/2019, 09/06/2019, 05/08/2020   Pneumococcal Conjugate-13 06/02/2013   Pneumococcal Polysaccharide-23 08/29/2014    TDAP status: Due, Education has been provided regarding the importance of this vaccine. Advised may receive this vaccine at local pharmacy or Health Dept. Aware to provide a copy of the vaccination record if obtained from local pharmacy or Health Dept. Verbalized acceptance and understanding.  Flu Vaccine status: Up to date  Pneumococcal vaccine status: Up to date  Covid-19 vaccine status: Information provided on how to obtain vaccines.   Qualifies for  Shingles Vaccine? Yes   Zostavax completed No   Shingrix Completed?: No.    Education has been provided regarding the importance of this vaccine. Patient has been advised to call insurance company to determine out of pocket expense if they have not yet received this vaccine. Advised may also receive vaccine at local pharmacy or Health Dept. Verbalized acceptance and understanding.  Screening Tests Health Maintenance  Topic Date Due   DTaP/Tdap/Td (1 - Tdap) Never done   Zoster Vaccines- Shingrix (1 of 2) Never done   COVID-19 Vaccine (4 - 2024-25 season) 03/02/2023   Medicare Annual Wellness (AWV)  04/12/2023   Pneumonia Vaccine 66+ Years old  Completed   INFLUENZA VACCINE  Completed   DEXA SCAN  Completed   HPV VACCINES  Aged Out    Health Maintenance  Health Maintenance Due  Topic Date Due   DTaP/Tdap/Td (1 - Tdap) Never done   Zoster Vaccines- Shingrix (1 of 2) Never done   COVID-19 Vaccine (4 - 2024-25 season) 03/02/2023   Medicare Annual Wellness (AWV)  04/12/2023    Colorectal cancer screening: No longer required.   Mammogram status: No longer required due to age.  Bone Density status: Completed 12/06/21. Results reflect: Bone density results: OSTEOPOROSIS. Repeat every 2 years.  Lung Cancer Screening: (Low Dose CT Chest recommended if Age 60-80 years, 20 pack-year currently smoking OR have quit w/in 15years.) does not qualify.   Additional Screening:  Hepatitis C Screening: does not qualify  Vision Screening: Recommended annual ophthalmology exams for early detection of glaucoma and other disorders of the eye. Is the patient up to date with their annual eye exam?  Yes  Who is the provider or what is the name of the office in which the patient attends annual eye exams? [facility]. If pt is not established with a provider, would they like to be referred to a provider to establish care? No .   Dental Screening: Recommended annual dental exams for proper oral  hygiene  Diabetic Foot Exam: N/a  Community Resource Referral / Chronic Care Management: CRR required this visit?  No   CCM required this visit?  No     Plan:     I have personally reviewed and noted the following in the patient's chart:   Medical and social history Use of alcohol, tobacco or illicit drugs  Current medications and supplements including opioid prescriptions. Patient is not currently taking opioid prescriptions. Functional ability and status Nutritional status Physical activity Advanced directives List of other physicians Hospitalizations, surgeries, and ER visits in previous 12 months Vitals Screenings to include cognitive, depression, and falls Referrals and appointments  In addition, I have reviewed and discussed with patient certain preventive protocols, quality metrics, and best practice recommendations. A written personalized care plan for preventive services as well as general preventive health recommendations were  provided to patient.     Donne Anon, CMA   07/29/2023   After Visit Summary: (MyChart) Due to this being a telephonic visit, the after visit summary with patients personalized plan was offered to patient via MyChart   Nurse Notes: None

## 2023-07-30 ENCOUNTER — Other Ambulatory Visit (HOSPITAL_BASED_OUTPATIENT_CLINIC_OR_DEPARTMENT_OTHER): Payer: Self-pay

## 2023-07-30 ENCOUNTER — Other Ambulatory Visit: Payer: Self-pay | Admitting: Family Medicine

## 2023-07-30 DIAGNOSIS — Z889 Allergy status to unspecified drugs, medicaments and biological substances status: Secondary | ICD-10-CM

## 2023-07-31 ENCOUNTER — Other Ambulatory Visit (HOSPITAL_BASED_OUTPATIENT_CLINIC_OR_DEPARTMENT_OTHER): Payer: Self-pay

## 2023-07-31 MED ORDER — FLUTICASONE PROPIONATE 50 MCG/ACT NA SUSP
2.0000 | Freq: Every day | NASAL | 5 refills | Status: DC | PRN
  Filled 2023-07-31: qty 16, 30d supply, fill #0

## 2023-08-06 ENCOUNTER — Ambulatory Visit: Payer: Medicare Other

## 2023-08-06 DIAGNOSIS — I480 Paroxysmal atrial fibrillation: Secondary | ICD-10-CM

## 2023-08-07 ENCOUNTER — Other Ambulatory Visit (HOSPITAL_BASED_OUTPATIENT_CLINIC_OR_DEPARTMENT_OTHER): Payer: Self-pay

## 2023-08-07 ENCOUNTER — Other Ambulatory Visit: Payer: Self-pay | Admitting: Internal Medicine

## 2023-08-08 LAB — CUP PACEART REMOTE DEVICE CHECK
Battery Impedance: 4499 Ohm
Battery Remaining Longevity: 7 mo
Battery Voltage: 2.65 V
Brady Statistic AP VP Percent: 4 %
Brady Statistic AP VS Percent: 0 %
Brady Statistic AS VP Percent: 96 %
Brady Statistic AS VS Percent: 0 %
Date Time Interrogation Session: 20250206093538
Implantable Lead Connection Status: 753985
Implantable Lead Connection Status: 753985
Implantable Lead Implant Date: 20041110
Implantable Lead Implant Date: 20041110
Implantable Lead Location: 753859
Implantable Lead Location: 753860
Implantable Pulse Generator Implant Date: 20130318
Lead Channel Impedance Value: 390 Ohm
Lead Channel Impedance Value: 497 Ohm
Lead Channel Pacing Threshold Amplitude: 0.5 V
Lead Channel Pacing Threshold Amplitude: 0.5 V
Lead Channel Pacing Threshold Pulse Width: 0.4 ms
Lead Channel Pacing Threshold Pulse Width: 0.4 ms
Lead Channel Setting Pacing Amplitude: 2 V
Lead Channel Setting Pacing Amplitude: 2.5 V
Lead Channel Setting Pacing Pulse Width: 0.46 ms
Lead Channel Setting Sensing Sensitivity: 4 mV
Zone Setting Status: 755011
Zone Setting Status: 755011

## 2023-08-12 ENCOUNTER — Other Ambulatory Visit (HOSPITAL_BASED_OUTPATIENT_CLINIC_OR_DEPARTMENT_OTHER): Payer: Self-pay

## 2023-08-18 ENCOUNTER — Other Ambulatory Visit (HOSPITAL_BASED_OUTPATIENT_CLINIC_OR_DEPARTMENT_OTHER): Payer: Self-pay

## 2023-08-26 ENCOUNTER — Other Ambulatory Visit: Payer: Self-pay | Admitting: Family Medicine

## 2023-08-26 DIAGNOSIS — M545 Low back pain, unspecified: Secondary | ICD-10-CM

## 2023-08-26 DIAGNOSIS — M546 Pain in thoracic spine: Secondary | ICD-10-CM

## 2023-08-27 ENCOUNTER — Other Ambulatory Visit (HOSPITAL_BASED_OUTPATIENT_CLINIC_OR_DEPARTMENT_OTHER): Payer: Self-pay

## 2023-08-27 MED ORDER — TRAMADOL HCL 50 MG PO TABS
50.0000 mg | ORAL_TABLET | Freq: Four times a day (QID) | ORAL | 0 refills | Status: DC | PRN
  Filled 2023-08-27: qty 60, 8d supply, fill #0

## 2023-08-27 NOTE — Telephone Encounter (Signed)
 Requesting: Tramadol 50 mg  Contract: N/A UDS: N/A Last Visit: 07/15/2023 Next Visit: N/A Last Refill: 06/30/2023   Please Advise

## 2023-09-01 ENCOUNTER — Encounter: Payer: Self-pay | Admitting: Internal Medicine

## 2023-09-12 NOTE — Progress Notes (Signed)
 Remote pacemaker transmission.

## 2023-09-12 NOTE — Addendum Note (Signed)
 Addended by: Elease Etienne A on: 09/12/2023 03:01 PM   Modules accepted: Orders

## 2023-09-19 ENCOUNTER — Encounter: Payer: Self-pay | Admitting: Family Medicine

## 2023-09-19 ENCOUNTER — Other Ambulatory Visit (HOSPITAL_BASED_OUTPATIENT_CLINIC_OR_DEPARTMENT_OTHER): Payer: Self-pay

## 2023-09-19 ENCOUNTER — Ambulatory Visit (INDEPENDENT_AMBULATORY_CARE_PROVIDER_SITE_OTHER): Admitting: Family Medicine

## 2023-09-19 VITALS — BP 128/70 | HR 82 | Temp 97.8°F | Resp 16 | Ht 62.99 in | Wt 141.6 lb

## 2023-09-19 DIAGNOSIS — R42 Dizziness and giddiness: Secondary | ICD-10-CM | POA: Diagnosis not present

## 2023-09-19 DIAGNOSIS — H6993 Unspecified Eustachian tube disorder, bilateral: Secondary | ICD-10-CM

## 2023-09-19 DIAGNOSIS — Z889 Allergy status to unspecified drugs, medicaments and biological substances status: Secondary | ICD-10-CM | POA: Diagnosis not present

## 2023-09-19 MED ORDER — FLUTICASONE PROPIONATE 50 MCG/ACT NA SUSP
2.0000 | Freq: Every day | NASAL | 5 refills | Status: AC | PRN
Start: 2023-09-19 — End: ?
  Filled 2023-09-19: qty 16, 30d supply, fill #0
  Filled 2023-11-15: qty 16, 30d supply, fill #1
  Filled 2024-03-02: qty 16, 30d supply, fill #2
  Filled 2024-04-12 – 2024-05-11 (×2): qty 16, 30d supply, fill #3

## 2023-09-19 MED ORDER — AZELASTINE HCL 0.1 % NA SOLN
1.0000 | Freq: Two times a day (BID) | NASAL | 12 refills | Status: DC
  Filled 2023-09-19: qty 30, 30d supply, fill #0
  Filled 2023-10-17: qty 30, 30d supply, fill #1

## 2023-09-19 NOTE — Progress Notes (Signed)
 Established Patient Office Visit  Subjective   Patient ID: [name], female    DOB: [birth year]  Age: 83 y.o. MRN: [id number]  Chief Complaint  Patient presents with   Hearing Problem    Sxs started Sunday night, bilateral, feels full.     HPI Discussed the use of AI scribe software for clinical note transcription with the patient, who gave verbal consent to proceed.  History of Present Illness [name] "[name]" is a 83 year old female who presents with hearing changes and dizziness.  She has experienced significant changes in her hearing since Sunday evening, describing a sensation of fullness in both ears. Sounds, including her own voice, seem muffled and as if she is 'underwater'. No recent illness or infections. She has had a headache across the ear area today, which is unusual for her.  She has a history of dizziness that has worsened over the past ten years, particularly when walking and turning her head. Recently, the dizziness has become more pronounced, accompanied by nausea when looking up or turning her head while walking. These symptoms have impacted her ability to perform tasks such as measuring for draperies, as she becomes too dizzy and nauseous to continue.  She is currently taking Xyzal every night and loratadine in the morning for congestion. She has not been using Flonase recently but has it available. She also takes levothyroxine and hydrochlorothiazide, though these are not directly related to her current symptoms.  She recently injured her finger, which is swollen and bruised, after it was crushed in a garage door. Her husband also sustained a finger injury earlier in the week, requiring stitches and resulting in a broken finger.  She does not have prescription insurance and often shops around for the best prices on medications. She is aware that some medications may be cheaper over the counter.   Patient Active Problem List   Diagnosis Date Noted    Acute non-recurrent pansinusitis 07/16/2023   Nocturia 04/16/2023   Feeling of incomplete bladder emptying 04/16/2023   Pelvic pain 04/16/2023   Radiculopathy, lumbar region 04/01/2023   Frequency of urination 03/11/2023   Need for influenza vaccination 03/11/2023   Degenerative disc disease, lumbar 01/06/2023   Neck pain 11/08/2022   Degenerative disc disease, cervical 11/08/2022   Acute bilateral low back pain with bilateral sciatica 04/25/2022   History of vertebral compression fracture 04/25/2022   Non-recurrent acute serous otitis media of right ear 09/02/2021   Chronic diastolic heart failure (HCC) 07/04/2021   Aortic ectasia, unspecified site (HCC) 07/04/2021   SOB (shortness of breath) 06/15/2021   Pulmonary nodule 06/15/2021   Hypokalemia 10/02/2020   Neuropathy 10/02/2020   Elevated serum creatinine 10/02/2020   AKI (acute kidney injury) (HCC) 09/15/2020   Left-sided chest pain    Congestion of nasal sinus    Substernal chest pain 09/13/2020   Spider veins 04/05/2020   Varicose veins of bilateral lower extremities with other complications 04/05/2020   H/O seasonal allergies 04/05/2020   Numbness and tingling of both feet 12/23/2019   Paroxysmal atrial fibrillation (HCC) 07/22/2019   V-tach (HCC) 07/22/2019   Lipoma of left upper extremity 03/11/2019   Hordeolum externum of left upper eyelid 02/15/2019   Hyponatremia 02/15/2019   Hyperlipidemia 06/03/2017   Renal mass 03/20/2016   Fecal impaction (HCC) 10/16/2013   Vertebral compression fracture (HCC) 10/16/2013   Constipation 10/15/2013   Abdominal pain 10/15/2013   PUD (peptic ulcer disease) 10/15/2013   Low back pain  with radiation 10/15/2013   Hypothyroidism, 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 08/2011 Upgrade of PPM to MDT Adapta L Dual Chamber PPM ser # [id number].   Cardiac pacemaker in situ    Contact dermatitis and eczema    unspec cause   Dairy product intolerance    Depression    Diastolic dysfunction    GERD (gastroesophageal reflux disease)    Hiatal hernia    History of diverticulitis of colon    2003  perforated diverticulitis w/ surgical intervention   History of kidney stones    age 48   History of peripheral edema    lower extremities   History of thyroid nodule    multinodular goiter  s/p  total thyroidectomy 2014   HTN (hypertension)    Hydronephrosis, left    Hypothyroidism    Mild sleep apnea    per study 04-14-2014   Nocturia    NSVT (nonsustained ventricular tachycardia) (HCC)    Osteoporosis    PAF (paroxysmal atrial fibrillation) (HCC)    PONV (postoperative nausea and vomiting)    Pulmonary nodules    stable per last ct   Renal cell carcinoma of left kidney (HCC)    Superficial thrombophlebitis    Past Surgical History:  Procedure Laterality Date   CARDIOVASCULAR STRESS TEST  04-20-2015  dr Graciela Husbands   normal nuclear study/  no ischemia/  normal LV function and wall motion, ef 74%   COLON SURGERY  2003   COLONOSCOPY  01/19/2013   COLOSTOMY TAKEDOWN  07/22/2002   CYSTOSCOPY WITH RETROGRADE PYELOGRAM, URETEROSCOPY AND STENT PLACEMENT Left 07/28/2015   Procedure: CYSTOSCOPY WITH LEFT RETROGRADE PYELOGRAM, POSSIBLE LEFT URETEROSCOPY AND STENT PLACEMENT;  Surgeon: Marcine Matar, MD;  Location: [facility];  Service:  Urology;  Laterality: Left;   EXPLORATORY LAPARTOMY SIGMOID COLECTOMY/ COLOSTOMY  04/28/2002   perforated diverticulitis   KYPHOPLASTY     LAPAROSCOPY VENTRAL HERNIA REPAIR/  EXTENSIVE LYSIS ADHESIONS  05/04/2004   PACEMAKER GENERATOR CHANGE Left 09/16/2011   MDT ADDRL1 pacemaker   PACEMAKER PLACEMENT  05/11/2003   medtronic   ROBOT ASSISTED LAPAROSCOPIC NEPHRECTOMY Left 03/20/2016   Procedure: XI ROBOTIC ASSISTED LAPAROSCOPIC RETROPERITONEAL NEPHRECTOMY;  Surgeon: Sebastian Ache, MD;  Location: [facility];  Service: Urology;  Laterality: Left;   THYROIDECTOMY N/A 08/07/2012   Procedure: THYROIDECTOMY;  Surgeon: Velora Heckler, MD;  Location: [facility];  Service: General;  Laterality: N/A;   TRANSTHORACIC ECHOCARDIOGRAM  10/12/2014   moderate focal basal LVH,  ef 55-60%,  grade 2 diastolic dysfunction, mild AV calcification without stenosis,  mild MR,  trivial PR   Social History   Tobacco Use   Smoking status: Former    Current packs/day: 0.00    Types: Cigarettes    Start date: 10/05/1964    Quit date: 10/06/1979    Years since quitting: 43.9   Smokeless tobacco: Never  Vaping  Use   Vaping status: Never Used  Substance Use Topics   Alcohol use: No   Drug use: No   Social History   Socioeconomic History   Marital status: Single    Spouse name: Not on file   Number of children: 2   Years of education: 16   Highest education level: Not on file  Occupational History   Occupation: Retired Nurse, adult for the [employer]    Employer: RETIRED  Tobacco Use   Smoking status: Former    Current packs/day: 0.00    Types: Cigarettes    Start date: 10/05/1964    Quit date: 10/06/1979    Years since quitting: 43.9   Smokeless tobacco: Never  Vaping Use   Vaping status: Never Used  Substance and Sexual Activity   Alcohol use: No   Drug use: No   Sexual activity: Not Currently    Birth control/protection: None  Other Topics Concern   Not on file  Social History Narrative    The patient is a Buyer, retail of [employer].  She worked for the [employer] in [employer].  She was married for 22 years, divorced, and has remained single. She has 1 grown son, 1 grown daughter.  She has 3 grandchildren, 2 of her children   live in [place].  She is retired.  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 She had a long term companion, 32 years, who passed away September 23, 2010.   Social Drivers of Corporate investment banker Strain: Low Risk  (07/28/2023)   Overall Financial Resource Strain (CARDIA)    Difficulty of Paying Living Expenses: Not very hard  Food Insecurity: No Food Insecurity (07/28/2023)   Hunger Vital Sign    Worried About Running Out of Food in the Last Year: Never true    Ran Out of Food in the Last Year: Never true  Transportation Needs: No Transportation Needs (07/28/2023)   PRAPARE - Administrator, Civil Service (Medical): No    Lack of Transportation (Non-Medical): No  Physical Activity: Insufficiently Active (07/28/2023)   Exercise Vital Sign    Days of Exercise per Week: 2 days    Minutes of Exercise per Session: 20 min  Stress: No Stress Concern Present (07/28/2023)   [employer] of Occupational Health - Occupational Stress Questionnaire    Feeling of Stress : Not at all  Social Connections: Unknown (07/28/2023)   Social Connection and Isolation Panel [NHANES]    Frequency of Communication with Friends and Family: More than three times a week    Frequency of Social Gatherings with Friends and Family: Once a week    Attends Religious Services: Patient declined    Database administrator or Organizations: No    Attends Banker Meetings: Never    Marital Status: Divorced  Catering manager Violence: Not At Risk (03/21/2021)   Humiliation, Afraid, Rape, and Kick questionnaire    Fear of Current or Ex-Partner: No    Emotionally Abused: No    Physically Abused: No    Sexually Abused: No    Family Status  Relation Name Status   Mother Drinda Butts Deceased   Father  Deceased   Sister  (Not Specified)   Brother Trinna Post Deceased at age 80       MI   Brother Reggie (Not Specified)   Son  (Not Specified)   Neg Hx  (Not Specified)  No partnership data on file   Family History  Problem Relation Age of Onset   Heart disease Mother        Stent in her 92s   Stroke Mother    Hypertension Mother    Varicose Veins Mother    Heart failure Father    Diverticulosis Sister    Hypertension Brother    Heart disease Brother    Hypertension Brother    Heart attack Neg Hx    Allergies  Allergen Reactions   Tape Rash   Codeine Nausea And Vomiting      Review of Systems  Constitutional:  Negative for fever and malaise/fatigue.  HENT:  Positive for congestion and hearing loss.   Eyes:  Negative for blurred vision.  Respiratory:  Negative for cough and shortness of breath.   Cardiovascular:  Negative for chest pain, palpitations and leg swelling.  Gastrointestinal:  Negative for vomiting.  Musculoskeletal:  Negative for back pain.  Skin:  Negative for rash.  Neurological:  Negative for loss of consciousness and headaches.      Objective:     BP 128/70 (BP Location: Right Arm, Patient Position: Sitting, Cuff Size: Normal)   Pulse 82   Temp 97.8 F (36.6 C) (Oral)   Resp 16   Ht 5' 2.99" (1.6 m)   Wt 141 lb 9.6 oz (64.2 kg)   SpO2 97%   BMI 25.09 kg/m  BP Readings from Last 3 Encounters:  09/19/23 128/70  07/15/23 (!) 140/88  05/27/23 138/80   Wt Readings from Last 3 Encounters:  09/19/23 141 lb 9.6 oz (64.2 kg)  07/15/23 146 lb (66.2 kg)  05/27/23 145 lb 12.8 oz (66.1 kg)   SpO2 Readings from Last 3 Encounters:  09/19/23 97%  07/15/23 95%  05/27/23 98%      Physical Exam Vitals and nursing note reviewed.  Constitutional:      General: She is not in acute distress.    Appearance: Normal appearance. She is well-developed.  HENT:     Head: Normocephalic  and atraumatic.     Right Ear: Decreased hearing noted. A middle ear effusion is present. There is no impacted cerumen.     Left Ear: Decreased hearing noted. A middle ear effusion is present. There is no impacted cerumen.     Nose: No congestion.  Eyes:     General: No scleral icterus.       Right eye: No discharge.        Left eye: No discharge.  Cardiovascular:     Rate and Rhythm: Normal rate and regular rhythm.     Heart sounds: No murmur heard. Pulmonary:     Effort: Pulmonary effort is normal. No respiratory distress.     Breath sounds: Normal breath sounds.  Musculoskeletal:        General: Normal range of motion.     Cervical back: Normal range of motion and neck supple.     Right lower leg: No edema.     Left lower leg: No edema.  Skin:    General: Skin is warm and dry.  Neurological:     Mental Status: She is alert and oriented to person, place, and time.  Psychiatric:        Mood and Affect: Mood normal.        Behavior: Behavior normal.        Thought Content: Thought content normal.        Judgment: Judgment normal.  No results found for any visits on 09/19/23.  Last CBC Lab Results  Component Value Date   WBC 5.4 01/06/2023   HGB 12.6 01/06/2023   HCT 39.2 01/06/2023   MCV 83.9 01/06/2023   MCH 27.5 06/06/2021   RDW 13.4 01/06/2023   PLT 177.0 01/06/2023   Last metabolic panel Lab Results  Component Value Date   GLUCOSE 90 01/06/2023   NA 134 (L) 01/06/2023   K 4.6 01/06/2023   CL 96 01/06/2023   CO2 29 01/06/2023   BUN 33 (H) 01/06/2023   CREATININE 1.02 01/06/2023   GFR 50.55 (L) 01/06/2023   CALCIUM 10.1 01/06/2023   PROT 7.0 01/06/2023   ALBUMIN 4.6 01/06/2023   BILITOT 0.5 01/06/2023   ALKPHOS 61 01/06/2023   AST 24 01/06/2023   ALT 16 01/06/2023   ANIONGAP 11 06/06/2021   Last lipids Lab Results  Component Value Date   CHOL 172 01/06/2023   HDL 47.20 01/06/2023   LDLCALC 107 (H) 01/06/2023   TRIG 89.0 01/06/2023    CHOLHDL 4 01/06/2023   Last hemoglobin A1c Lab Results  Component Value Date   HGBA1C 5.5 06/07/2016   Last thyroid functions Lab Results  Component Value Date   TSH 4.19 01/06/2023   T4TOTAL 12.3 03/10/2012   Last vitamin D Lab Results  Component Value Date   VD25OH 78 05/27/2023   Last vitamin B12 and Folate Lab Results  Component Value Date   VITAMINB12 517 12/11/2020      The ASCVD Risk score (Arnett DK, et al., 2019) failed to calculate for the following reasons:   The 2019 ASCVD risk score is only valid for ages 43 to 34    Assessment & Plan:   Problem List Items Addressed This Visit       Unprioritized   H/O seasonal allergies   Relevant Medications   fluticasone (FLONASE) 50 MCG/ACT nasal spray   Other Visit Diagnoses       Disorder of both eustachian tubes    -  Primary   Relevant Medications   azelastine (ASTELIN) 0.1 % nasal spray   Other Relevant Orders   Ambulatory referral to ENT     Dizzy       Relevant Orders   Ambulatory referral to ENT     Assessment and Plan Assessment & Plan Hearing loss with possible Eustachian tube dysfunction   She has experienced sudden hearing loss since Sunday, accompanied by a sensation of fullness in both ears. There is no recent illness, but fluid in the ears is suspected due to Eustachian tube dysfunction. The ears are not infected. The severity of hearing loss and altered perception of familiar sounds are concerning. Start Flonase nasal spray once daily to reduce fluid, which may take several weeks to clear. Continue Xyzal at night. Consider adding Astepro or Astelin nasal spray twice daily, or taking loratadine twice daily while holding off on Xyzal to assess effectiveness. If symptoms persist or worsen, refer to ENT. Prednisone is a potential option if nasal sprays are ineffective.  Dizziness and vertigo   She experiences dizziness and nausea, particularly when turning her head or looking up. This vertigo may  be related to inner ear issues, possibly linked to current ear symptoms. A headache across the ear suggests possible sinus involvement. Nasal sprays might help, but if symptoms persist, further evaluation by an ENT specialist is warranted. Provided exercises for dizziness via MyChart to help reposition ear crystals.  Finger injury   She  sustained a crush injury to her right finger while closing a garage door. The finger is black, blue, and swollen, but not currently painful. The injury occurred last night and is being managed with a wrapped finger. Seek medical attention if symptoms worsen.  Follow-up   She is scheduled to go on vacation and will be unavailable next week. A nurse practitioner will cover during her absence. Discussed pharmacy options for prescriptions without insurance coverage. Advised to contact the office if there are any urgent issues while away.    No follow-ups on file.    Donato Schultz, DO

## 2023-09-19 NOTE — Patient Instructions (Signed)
 How to Perform the Epley Maneuver The Epley maneuver is an exercise that relieves symptoms of vertigo. Vertigo is the feeling that you or your surroundings are moving when they are not. When you feel vertigo, you may feel like the room is spinning and may have trouble walking. The Epley maneuver is used for a type of vertigo caused by a calcium deposit in a part of the inner ear. The maneuver involves changing head positions to help the deposit move out of the area. You can do this maneuver at home whenever you have symptoms of vertigo. You can repeat it in 24 hours if your vertigo has not gone away. Even though the Epley maneuver may relieve your vertigo for a few weeks, it is possible that your symptoms will return. This maneuver relieves vertigo, but it does not relieve dizziness. What are the risks? If it is done correctly, the Epley maneuver is considered safe. Sometimes it can lead to dizziness or nausea that goes away after a short time. If you develop other symptoms--such as changes in vision, weakness, or numbness--stop doing the maneuver and call your health care provider. Supplies needed: A bed or table. A pillow. How to do the Epley maneuver     Sit on the edge of a bed or table with your back straight and your legs extended or hanging over the edge of the bed or table. Turn your head halfway toward the affected ear or side as told by your health care provider. Lie backward quickly with your head turned until you are lying flat on your back. Your head should dangle (head-hanging position). You may want to position a pillow under your shoulders. Hold this position for at least 30 seconds. If you feel dizzy or have symptoms of vertigo, continue to hold the position until the symptoms stop. Turn your head to the opposite direction until your unaffected ear is facing down. Your head should continue to dangle. Hold this position for at least 30 seconds. If you feel dizzy or have symptoms of  vertigo, continue to hold the position until the symptoms stop. Turn your whole body to the same side as your head so that you are positioned on your side. Your head will now be nearly facedown and no longer needs to dangle. Hold for at least 30 seconds. If you feel dizzy or have symptoms of vertigo, continue to hold the position until the symptoms stop. Sit back up. You can repeat the maneuver in 24 hours if your vertigo does not go away. Follow these instructions at home: For 24 hours after doing the Epley maneuver: Keep your head in an upright position. When lying down to sleep or rest, keep your head raised (elevated) with two or more pillows. Avoid excessive neck movements. Activity Do not drive or use machinery if you feel dizzy. After doing the Epley maneuver, return to your normal activities as told by your health care provider. Ask your health care provider what activities are safe for you. General instructions Drink enough fluid to keep your urine pale yellow. Do not drink alcohol. Take over-the-counter and prescription medicines only as told by your health care provider. Keep all follow-up visits. This is important. Preventing vertigo symptoms Ask your health care provider if there is anything you should do at home to prevent vertigo. He or she may recommend that you: Keep your head elevated with two or more pillows while you sleep. Do not sleep on the side of your affected ear. Get  up slowly from bed. Avoid sudden movements during the day. Avoid extreme head positions or movement, such as looking up or bending over. Contact a health care provider if: Your vertigo gets worse. You have other symptoms, including: Nausea. Vomiting. Headache. Get help right away if you: Have vision changes. Have a headache or neck pain that is severe or getting worse. Cannot stop vomiting. Have new numbness or weakness in any part of your body. These symptoms may represent a serious problem  that is an emergency. Do not wait to see if the symptoms will go away. Get medical help right away. Call your local emergency services (911 in the [place] Do not drive yourself to the hospital. Summary Vertigo is the feeling that you or your surroundings are moving when they are not. The Epley maneuver is an exercise that relieves symptoms of vertigo. If the Epley maneuver is done correctly, it is considered safe. This information is not intended to replace advice given to you by your health care provider. Make sure you discuss any questions you have with your health care provider. Document Revised: 03/14/2023 Document Reviewed: 03/14/2023 Elsevier Patient Education  2024 [employer].

## 2023-09-20 ENCOUNTER — Other Ambulatory Visit: Payer: Self-pay | Admitting: Family Medicine

## 2023-09-20 DIAGNOSIS — I10 Essential (primary) hypertension: Secondary | ICD-10-CM

## 2023-09-22 ENCOUNTER — Other Ambulatory Visit (HOSPITAL_BASED_OUTPATIENT_CLINIC_OR_DEPARTMENT_OTHER): Payer: Self-pay

## 2023-09-22 MED ORDER — LISINOPRIL 40 MG PO TABS
40.0000 mg | ORAL_TABLET | Freq: Every day | ORAL | 1 refills | Status: DC
Start: 2023-09-22 — End: 2024-04-03
  Filled 2023-09-22: qty 90, 90d supply, fill #0
  Filled 2024-01-03: qty 90, 90d supply, fill #1

## 2023-09-30 ENCOUNTER — Telehealth: Payer: Self-pay | Admitting: Internal Medicine

## 2023-09-30 NOTE — Telephone Encounter (Signed)
 Pt c/o medication issue:  1. Name of Medication: Eliquis  2. How are you currently taking this medication (dosage and times per day)?   3. Are you having a reaction (difficulty breathing--STAT)?   4. What is your medication issue? Patient said she took the Eliquis 8:00 AM and again at 11:30 Am- Very concerned because she is supposed to take it every 12 hours

## 2023-09-30 NOTE — Telephone Encounter (Signed)
 Spoke with patient and she states she took her evening dose of lasix by accident after she ate around 11-30 12.   Advised patient not  to take any more doses today. Monitor for bleeding. We discussed bleeding precautions. ED precautions discussed.

## 2023-10-01 ENCOUNTER — Encounter: Payer: Self-pay | Admitting: Family Medicine

## 2023-10-02 ENCOUNTER — Encounter: Payer: Self-pay | Admitting: Family Medicine

## 2023-10-02 ENCOUNTER — Other Ambulatory Visit (HOSPITAL_BASED_OUTPATIENT_CLINIC_OR_DEPARTMENT_OTHER): Payer: Self-pay

## 2023-10-02 ENCOUNTER — Ambulatory Visit: Admitting: Family Medicine

## 2023-10-02 VITALS — BP 120/70 | HR 95 | Temp 97.6°F | Resp 16 | Ht 62.99 in | Wt 141.2 lb

## 2023-10-02 DIAGNOSIS — H1131 Conjunctival hemorrhage, right eye: Secondary | ICD-10-CM

## 2023-10-02 DIAGNOSIS — D229 Melanocytic nevi, unspecified: Secondary | ICD-10-CM

## 2023-10-02 DIAGNOSIS — S0501XA Injury of conjunctiva and corneal abrasion without foreign body, right eye, initial encounter: Secondary | ICD-10-CM | POA: Diagnosis not present

## 2023-10-02 MED ORDER — MOXIFLOXACIN HCL 0.5 % OP SOLN
1.0000 [drp] | Freq: Three times a day (TID) | OPHTHALMIC | 0 refills | Status: DC
  Filled 2023-10-02: qty 3, 20d supply, fill #0

## 2023-10-02 NOTE — Progress Notes (Signed)
 Established Patient Office Visit  Subjective   Patient ID: [name], female    DOB: February 08, 1941  Age: 83 y.o. MRN: [id number]  Chief Complaint  Patient presents with  . Eye Problem    HPI Discussed the use of AI scribe software for clinical note transcription with the patient, who gave verbal consent to proceed.  History of Present Illness [name] "[name]" is a 83 year old female who presents with a sensation of something in her right eye.  She woke up with a sensation of something in her right eye, describing it as similar to an eyelash but not as severe. The sensation is localized to the corner of her eye. No pain, drainage, or recent exposure to potential irritants like pollen or leaves, as she had not been outside over the weekend. The symptoms began on Monday morning, and there are no sneezing or other symptoms suggesting an allergic reaction.  She is concerned about her medication, Eliquis, due to the potential for bleeding, but denies any bleeding from the eye or elsewhere, such as in the toilet.  She experiences dizziness, which she attributes to fluid in her ear, noting that one ear feels more open than the other. This dizziness has been improving.  She recalls her daughter noticing a brown spot on her scalp about a year ago, which she recently observed has grown in size. The spot is located in her hairline, is mostly flat, and is not itchy. She had forgotten about it until her daughter pointed it out again.   Patient Active Problem List   Diagnosis Date Noted  . Acute non-recurrent pansinusitis 07/16/2023  . Nocturia 04/16/2023  . Feeling of incomplete bladder emptying 04/16/2023  . Pelvic pain 04/16/2023  . Radiculopathy, lumbar region 04/01/2023  . Frequency of urination 03/11/2023  . Need for influenza vaccination 03/11/2023  . Degenerative disc disease, lumbar 01/06/2023  . Neck pain 11/08/2022  . Degenerative disc disease, cervical 11/08/2022  .  Acute bilateral low back pain with bilateral sciatica 04/25/2022  . History of vertebral compression fracture 04/25/2022  . Non-recurrent acute serous otitis media of right ear 09/02/2021  . Chronic diastolic heart failure (HCC) 07/04/2021  . Aortic ectasia, unspecified site (HCC) 07/04/2021  . SOB (shortness of breath) 06/15/2021  . Pulmonary nodule 06/15/2021  . Hypokalemia 10/02/2020  . Neuropathy 10/02/2020  . Elevated serum creatinine 10/02/2020  . AKI (acute kidney injury) (HCC) 09/15/2020  . Left-sided chest pain   . Congestion of nasal sinus   . Substernal chest pain 09/13/2020  . Spider veins 04/05/2020  . Varicose veins of bilateral lower extremities with other complications 04/05/2020  . H/O seasonal allergies 04/05/2020  . Numbness and tingling of both feet 12/23/2019  . Paroxysmal atrial fibrillation (HCC) 07/22/2019  . V-tach (HCC) 07/22/2019  . Lipoma of left upper extremity 03/11/2019  . Hordeolum externum of left upper eyelid 02/15/2019  . Hyponatremia 02/15/2019  . Hyperlipidemia 06/03/2017  . Renal mass 03/20/2016  . Fecal impaction (HCC) 10/16/2013  . Vertebral compression fracture (HCC) 10/16/2013  . Constipation 10/15/2013  . Abdominal pain 10/15/2013  . PUD (peptic ulcer disease) 10/15/2013  . Low back pain with radiation 10/15/2013  . 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 Pacemaker-Medtronic 02/01/2011  . AV BLOCK, COMPLETE 11/23/2009  . Lipoma 11/30/2008  . GANGLION OF TENDON SHEATH 11/30/2008  . SUPERFICIAL THROMBOPHLEBITIS 10/10/2008  . LUNG NODULE 10/10/2008  . HEADACHE, TENSION 05/02/2008  .  GOITER, MULTINODULAR 01/19/2008  . DIASTOLIC DYSFUNCTION 10/26/2007  . DEPRESSION 01/23/2007  . Essential hypertension 01/23/2007  . GERD 01/23/2007  . Osteoporosis 01/23/2007  . Personal history of other endocrine, metabolic, and immunity disorders 01/23/2007   Past Medical History:   Diagnosis Date  . Allergy   . Atrioventricular block, complete (HCC)    a. 08/2011 Upgrade of PPM to MDT Adapta L Dual Chamber PPM ser # [id number].  . Cardiac pacemaker in situ   . Contact dermatitis and eczema    unspec cause  . Dairy product intolerance   . Depression   . Diastolic dysfunction   . GERD (gastroesophageal reflux disease)   . Hiatal hernia   . History of diverticulitis of colon    2003  perforated diverticulitis w/ surgical intervention  . History of kidney stones    age 69  . History of peripheral edema    lower extremities  . History of thyroid nodule    multinodular goiter  s/p  total thyroidectomy 2014  . HTN (hypertension)   . Hydronephrosis, left   . Hypothyroidism   . Mild sleep apnea    per study 04-14-2014  . Nocturia   . NSVT (nonsustained ventricular tachycardia) (HCC)   . Osteoporosis   . PAF (paroxysmal atrial fibrillation) (HCC)   . PONV (postoperative nausea and vomiting)   . Pulmonary nodules    stable per last ct  . Renal cell carcinoma of left kidney (HCC)   . Superficial thrombophlebitis    Past Surgical History:  Procedure Laterality Date  . CARDIOVASCULAR STRESS TEST  04-20-2015  dr Graciela Husbands   normal nuclear study/  no ischemia/  normal LV function and wall motion, ef 74%  . COLON SURGERY  2003  . COLONOSCOPY  01/19/2013  . COLOSTOMY TAKEDOWN  07/22/2002  . CYSTOSCOPY WITH RETROGRADE PYELOGRAM, URETEROSCOPY AND STENT PLACEMENT Left 07/28/2015   Procedure: CYSTOSCOPY WITH LEFT RETROGRADE PYELOGRAM, POSSIBLE LEFT URETEROSCOPY AND STENT PLACEMENT;  Surgeon: Marcine Matar, MD;  [facility];  Service: Urology;  Laterality: Left;  . EXPLORATORY LAPARTOMY SIGMOID COLECTOMY/ COLOSTOMY  04/28/2002   perforated diverticulitis  . KYPHOPLASTY    . LAPAROSCOPY VENTRAL HERNIA REPAIR/  EXTENSIVE LYSIS ADHESIONS  05/04/2004  . PACEMAKER GENERATOR CHANGE Left 09/16/2011   MDT ADDRL1 pacemaker  . PACEMAKER PLACEMENT   05/11/2003   medtronic  . ROBOT ASSISTED LAPAROSCOPIC NEPHRECTOMY Left 03/20/2016   Procedure: XI ROBOTIC ASSISTED LAPAROSCOPIC RETROPERITONEAL NEPHRECTOMY;  Surgeon: Sebastian Ache, MD;  Location: [facility];  Service: Urology;  Laterality: Left;  . THYROIDECTOMY N/A 08/07/2012   Procedure: THYROIDECTOMY;  Surgeon: Velora Heckler, MD;  Location: [facility];  Service: General;  Laterality: N/A;  . TRANSTHORACIC ECHOCARDIOGRAM  10/12/2014   moderate focal basal LVH,  ef 55-60%,  grade 2 diastolic dysfunction, mild AV calcification without stenosis,  mild MR,  trivial PR   Social History   Tobacco Use  . Smoking status: Former    Current packs/day: 0.00    Types: Cigarettes    Start date: 10/05/1964    Quit date: 10/06/1979    Years since quitting: 44.0  . Smokeless tobacco: Never  Vaping Use  . Vaping status: Never Used  Substance Use Topics  . Alcohol use: No  . Drug use: No   Social History   Socioeconomic History  . Marital status: Single    Spouse name: Not on file  . Number of children: 2  . Years of education: 35  .  Highest education level: Not on file  Occupational History  . Occupation: Retired Nurse, adult for the [employer]    Employer: RETIRED  Tobacco Use  . Smoking status: Former    Current packs/day: 0.00    Types: Cigarettes    Start date: 10/05/1964    Quit date: 10/06/1979    Years since quitting: 44.0  . Smokeless tobacco: Never  Vaping Use  . Vaping status: Never Used  Substance and Sexual Activity  . Alcohol use: No  . Drug use: No  . Sexual activity: Not Currently    Birth control/protection: None  Other Topics Concern  . Not on file  Social History Narrative   The patient is a Buyer, retail of [employer].  She worked for the [employer] in [employer].  She was married for 22 years, divorced, and has remained single. She has 1 grown son, 1 grown daughter.  She has 3 grandchildren, 2 of her children   live in [place].  She  is retired.  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 She had a long term companion, 32 years, who passed away 2010-10-25.   Social Drivers of Corporate investment banker Strain: Low Risk  (07/28/2023)   Overall Financial Resource Strain (CARDIA)   . Difficulty of Paying Living Expenses: Not very hard  Food Insecurity: No Food Insecurity (07/28/2023)   Hunger Vital Sign   . Worried About Programme researcher, broadcasting/film/video in the Last Year: Never true   . Ran Out of Food in the Last Year: Never true  Transportation Needs: No Transportation Needs (07/28/2023)   PRAPARE - Transportation   . Lack of Transportation (Medical): No   . Lack of Transportation (Non-Medical): No  Physical Activity: Insufficiently Active (07/28/2023)   Exercise Vital Sign   . Days of Exercise per Week: 2 days   . Minutes of Exercise per Session: 20 min  Stress: No Stress Concern Present (07/28/2023)   [employer] of Occupational Health - Occupational Stress Questionnaire   . Feeling of Stress : Not at all  Social Connections: Unknown (07/28/2023)   Social Connection and Isolation Panel [NHANES]   . Frequency of Communication with Friends and Family: More than three times a week   . Frequency of Social Gatherings with Friends and Family: Once a week   . Attends Religious Services: Patient declined   . Active Member of Clubs or Organizations: No   . Attends Banker Meetings: Never   . Marital Status: Divorced  Catering manager Violence: Not At Risk (03/21/2021)   Humiliation, Afraid, Rape, and Kick questionnaire   . Fear of Current or Ex-Partner: No   . Emotionally Abused: No   . Physically Abused: No   . Sexually Abused: No   Family Status  Relation Name Status  . Mother Drinda Butts Deceased  . Father  Deceased  . Sister  (Not Specified)  . Brother Trinna Post Deceased at age 44       MI  . Brother Reggie (Not Specified)  . Son  (Not Specified)  . Neg Hx  (Not Specified)  No partnership data on file    Family History  Problem Relation Age of Onset  . Heart disease Mother        Stent in her 39s  . Stroke Mother   . Hypertension Mother   . Varicose Veins Mother   . Heart failure Father   . Diverticulosis  Sister   . Hypertension Brother   . Heart disease Brother   . Hypertension Brother   . Heart attack Neg Hx    Allergies  Allergen Reactions  . Tape Rash  . Codeine Nausea And Vomiting      Review of Systems  Constitutional:  Negative for fever.  HENT:  Positive for congestion and ear pain.   Eyes:  Positive for pain and redness. Negative for blurred vision.  Respiratory:  Negative for cough.   Cardiovascular:  Negative for chest pain and palpitations.  Gastrointestinal:  Negative for vomiting.  Musculoskeletal:  Negative for back pain.  Skin:  Negative for rash.  Neurological:  Negative for loss of consciousness and headaches.     Objective:     BP 120/70 (BP Location: Left Arm, Patient Position: Sitting, Cuff Size: Normal)   Pulse 95   Temp 97.6 F (36.4 C) (Oral)   Resp 16   Ht 5' 2.99" (1.6 m)   Wt 141 lb 3.2 oz (64 kg)   SpO2 97%   BMI 25.02 kg/m  BP Readings from Last 3 Encounters:  10/02/23 120/70  09/19/23 128/70  07/15/23 (!) 140/88   Wt Readings from Last 3 Encounters:  10/02/23 141 lb 3.2 oz (64 kg)  09/19/23 141 lb 9.6 oz (64.2 kg)  07/15/23 146 lb (66.2 kg)   SpO2 Readings from Last 3 Encounters:  10/02/23 97%  09/19/23 97%  07/15/23 95%      Physical Exam Vitals and nursing note reviewed.  Constitutional:      General: She is not in acute distress.    Appearance: Normal appearance. She is well-developed.  HENT:     Head: Normocephalic and atraumatic.  Eyes:     General: Lids are normal. Lids are everted, no foreign bodies appreciated. No scleral icterus.       Right eye: No discharge.        Left eye: No discharge.     Extraocular Movements: Extraocular movements intact.     Conjunctiva/sclera:     Right eye: Right  conjunctiva is injected. Chemosis and hemorrhage present. No exudate.     Comments: R side R eye--- + abrasion seen with fluorescein and wood lamp +hemorrhage L side r eye   Cardiovascular:     Rate and Rhythm: Normal rate and regular rhythm.     Heart sounds: No murmur heard. Pulmonary:     Effort: Pulmonary effort is normal. No respiratory distress.     Breath sounds: Normal breath sounds.  Musculoskeletal:        General: Normal range of motion.     Cervical back: Normal range of motion and neck supple.     Right lower leg: No edema.     Left lower leg: No edema.  Skin:    General: Skin is warm and dry.  Neurological:     Mental Status: She is alert and oriented to person, place, and time.  Psychiatric:        Mood and Affect: Mood normal.        Behavior: Behavior normal.        Thought Content: Thought content normal.        Judgment: Judgment normal.    No results found for any visits on 10/02/23.  Last CBC Lab Results  Component Value Date   WBC 5.4 01/06/2023   HGB 12.6 01/06/2023   HCT 39.2 01/06/2023   MCV 83.9 01/06/2023   MCH 27.5 06/06/2021  RDW 13.4 01/06/2023   PLT 177.0 01/06/2023   Last metabolic panel Lab Results  Component Value Date   GLUCOSE 90 01/06/2023   NA 134 (L) 01/06/2023   K 4.6 01/06/2023   CL 96 01/06/2023   CO2 29 01/06/2023   BUN 33 (H) 01/06/2023   CREATININE 1.02 01/06/2023   GFR 50.55 (L) 01/06/2023   CALCIUM 10.1 01/06/2023   PROT 7.0 01/06/2023   ALBUMIN 4.6 01/06/2023   BILITOT 0.5 01/06/2023   ALKPHOS 61 01/06/2023   AST 24 01/06/2023   ALT 16 01/06/2023   ANIONGAP 11 06/06/2021   Last lipids Lab Results  Component Value Date   CHOL 172 01/06/2023   HDL 47.20 01/06/2023   LDLCALC 107 (H) 01/06/2023   TRIG 89.0 01/06/2023   CHOLHDL 4 01/06/2023   Last hemoglobin A1c Lab Results  Component Value Date   HGBA1C 5.5 06/07/2016   Last thyroid functions Lab Results  Component Value Date   TSH 4.19  01/06/2023   T4TOTAL 12.3 03/10/2012   Last vitamin D Lab Results  Component Value Date   VD25OH 78 05/27/2023   Last vitamin B12 and Folate Lab Results  Component Value Date   VITAMINB12 517 12/11/2020      The ASCVD Risk score (Arnett DK, et al., 2019) failed to calculate for the following reasons:   The 2019 ASCVD risk score is only valid for ages 16 to 37    Assessment & Plan:   Problem List Items Addressed This Visit   None Visit Diagnoses       Burst blood vessel of right eye    -  Primary   Relevant Medications   moxifloxacin (VIGAMOX) 0.5 % ophthalmic solution     Abrasion of right cornea, initial encounter       Relevant Medications   moxifloxacin (VIGAMOX) 0.5 % ophthalmic solution     Suspicious nevus       Relevant Orders   Ambulatory referral to Dermatology     Assessment and Plan Assessment & Plan Corneal Abrasion   She presents with a sensation of a foreign body in the right eye upon waking. There is no pain, but examination reveals a scratch on the outer cornea, not where redness is located. No drainage is observed. She is on Eliquis, raising concerns about bleeding, but there is no evidence of bleeding elsewhere. The scratch likely causes the sensation of an eyelash in the eye. Prescribe eye drops to be used three times a day for seven days. Advise the use of cool compresses and instruct her to use Systane or Refresh for dry eye if available. Advise contacting an ophthalmologist if symptoms do not improve in three to four days or if they worsen.  Ear Fullness   She experiences dizziness and fullness in the ears, with the right ear feeling more open than the left. Fluid is present in the ear, but the condition is improving. An ENT referral was authorized but not yet contacted. Check on the status of the ENT referral, but if symptoms continue to improve, the referral may not be necessary.  Brown Spot on Scalp   She reports a brown spot on the scalp noticed  a year ago, which has increased in size. It is located in the hairline and appears mostly flat, with no associated itching or other symptoms. Given the change in size, further evaluation is warranted. Refer to a dermatologist for evaluation of the brown spot on the scalp.  No follow-ups on file.    Donato Schultz, DO

## 2023-10-02 NOTE — Patient Instructions (Signed)
 Corneal Abrasion  A corneal abrasion is a scratch or injury to the clear tissue at the front of the eye (cornea). It can be very painful. The cornea protects the eye and helps the eye focus. The cornea is made up of many layers, but the surface layer is one of the most sensitive tissues in the body. A corneal abrasion heals fast when it is treated. If it is not treated, it can become infected and cause an open sore (ulcer). This can lead to scarring, and the scarring can affect vision. Sometimes after the abrasion heals, another abrasion can come back in the same place. What are the causes? A corneal abrasion may be caused by: A poke to the eye. A gritty or irritating substance (foreign body) in the eye. Too much eye rubbing. Very dry eyes. Certain eye infections. Contact lenses that do not fit right or are worn for too long. Injury can also happen when putting in contact lenses or taking them out. Eye surgery. Certain cornea problems that make it more likely to get a corneal abrasion. Sometimes, the cause is not known. What are the signs or symptoms? Symptoms of this condition include: Eye pain. The pain may get worse when you open, close, or move your eye. A feeling that something is poking your eye or is stuck in your eye. Tearing, redness, and sensitivity to light. Having trouble keeping your eye open, or not being able to keep it open. Blurred vision. Headache. How is this diagnosed? A corneal abrasion may be diagnosed based on your medical history, symptoms, and an eye exam. You may need to see a doctor who specializes in conditions of the eye (ophthalmologist or optometrist). Before the eye exam, you may be given eye drops that numb the eye. You may also have dye put in your eye with a dropper or a small paper strip. This dye helps your eye doctor see your injury better when using a light or an eye scope (slit lamp). How is this treated? Treatment may vary based on what caused the  corneal abrasion. It may include: Washing out your eye. Taking out anything that is stuck in your eye. Using antibiotic drops or ointment to treat or prevent an infection. Using eye drops that lessens inflammation and pain. Using steroid drops or ointment to treat redness, irritation, or inflammation. Applying a cold, wet cloth (cold compress) or ice pack to lessen the pain. Taking pain medicines. This may include over-the-counter medicines like ibuprofen. If you have a large or deep corneal abrasion, an opioid medicine may be prescribed. For large abrasions, an eye patch or a bandage soft contact lens might also be used. A bandage soft contact lens protects the cornea while it is healing. These are not used if the corneal abrasion is related to wearing contact lenses. This is because you may be more likely to get an eye infection. Follow these instructions at home: Medicines If you were prescribed eye drops or ointment, use them as told by your provider. You may be told to use: Antibiotic eye drops or ointment. Do not stop using them even if you start to feel better. Eye drops that moisten the eye (lubricating eye drops). Ask your provider if the medicine prescribed to you: Requires you to avoid driving or using machinery. Can cause constipation. You may need to take these actions to prevent or treat constipation: Drink enough fluid to keep your pee (urine) pale yellow. Take over-the-counter or prescription medicines. Eat foods that  are high in fiber, such as beans, whole grains, and fresh fruits and vegetables. Limit foods that are high in fat and processed sugars, such as fried or sweet foods. Take over-the-counter and prescription medicines only as told by your provider. Eye care Rest your eyes. Avoid bright light and overusing (straining) your eyes. Wear sunglasses. Do not rub or touch your eye. Do not wash out your eye. Ask your provider if you can use a cold compress on your eye to  lessen pain. If you have an eye patch: Wear it as told by your provider. Follow your provider's instructions about when to take it off. If you have a bandage soft contact lens, your provider will take it off during your follow-up visit. Do not wear your own contact lenses until your provider says it is okay. General instructions Do not drive or use machinery as told by your provider. You may not be able to judge distances as well if your vision is affected or if you are wearing an eye patch. Keep all follow-up visits. This helps to prevent infection and vision loss. Contact a health care provider if: You keep having eye pain and other symptoms for more than 2-3 days. You have new symptoms, such as more redness, tearing, or fluid (discharge) coming from your eye. You have swollen eyelids. Your vision gets much worse. You have discharge that makes your eyelids stick together in the morning. Your eye patch becomes so loose that you can blink your eye. Symptoms come back after your abrasion heals. Get help right away if: You have severe eye pain that does not get better with medicine. You have severe vision loss. This information is not intended to replace advice given to you by your health care provider. Make sure you discuss any questions you have with your health care provider. Document Revised: 07/04/2022 Document Reviewed: 07/04/2022 Elsevier Patient Education  2024 [employer].

## 2023-10-03 ENCOUNTER — Other Ambulatory Visit: Payer: Self-pay

## 2023-10-03 MED ORDER — APIXABAN 5 MG PO TABS: 5.0000 mg | ORAL_TABLET | Freq: Two times a day (BID) | ORAL | 1 refills | Status: DC

## 2023-10-03 NOTE — Telephone Encounter (Signed)
 Prescription refill request for Eliquis received. Indication:afib Last office visit:4/24 Scr:1.02  7/24 Age: 83 Weight:64  kg  Prescription refilled

## 2023-10-06 ENCOUNTER — Ambulatory Visit (INDEPENDENT_AMBULATORY_CARE_PROVIDER_SITE_OTHER)

## 2023-10-06 DIAGNOSIS — I480 Paroxysmal atrial fibrillation: Secondary | ICD-10-CM

## 2023-10-08 ENCOUNTER — Encounter: Payer: Self-pay | Admitting: Family Medicine

## 2023-10-08 LAB — CUP PACEART REMOTE DEVICE CHECK
Battery Impedance: 5333 Ohm
Battery Remaining Longevity: 4 mo
Battery Voltage: 2.63 V
Brady Statistic AP VP Percent: 5 %
Brady Statistic AP VS Percent: 0 %
Brady Statistic AS VP Percent: 95 %
Brady Statistic AS VS Percent: 0 %
Date Time Interrogation Session: 20250408131140
Implantable Lead Connection Status: 753985
Implantable Lead Connection Status: 753985
Implantable Lead Implant Date: 20041110
Implantable Lead Implant Date: 20041110
Implantable Lead Location: 753859
Implantable Lead Location: 753860
Implantable Pulse Generator Implant Date: 20130318
Lead Channel Impedance Value: 391 Ohm
Lead Channel Impedance Value: 512 Ohm
Lead Channel Pacing Threshold Amplitude: 0.625 V
Lead Channel Pacing Threshold Amplitude: 0.625 V
Lead Channel Pacing Threshold Pulse Width: 0.4 ms
Lead Channel Pacing Threshold Pulse Width: 0.4 ms
Lead Channel Setting Pacing Amplitude: 2 V
Lead Channel Setting Pacing Amplitude: 2.5 V
Lead Channel Setting Pacing Pulse Width: 0.4 ms
Lead Channel Setting Sensing Sensitivity: 4 mV
Zone Setting Status: 755011
Zone Setting Status: 755011

## 2023-10-15 NOTE — Telephone Encounter (Signed)
 Prolia VOB initiated via [email or web address]  Next Prolia inj DUE: 11/25/23

## 2023-10-17 ENCOUNTER — Other Ambulatory Visit: Payer: Self-pay

## 2023-10-18 ENCOUNTER — Encounter: Payer: Self-pay | Admitting: Internal Medicine

## 2023-10-26 ENCOUNTER — Other Ambulatory Visit: Payer: Self-pay | Admitting: Family Medicine

## 2023-10-26 DIAGNOSIS — M545 Low back pain, unspecified: Secondary | ICD-10-CM

## 2023-10-26 DIAGNOSIS — M546 Pain in thoracic spine: Secondary | ICD-10-CM

## 2023-10-27 ENCOUNTER — Other Ambulatory Visit (HOSPITAL_BASED_OUTPATIENT_CLINIC_OR_DEPARTMENT_OTHER): Payer: Self-pay

## 2023-10-27 NOTE — Telephone Encounter (Signed)
 Requesting: Tramadol  Contract:  UDS: Last OV: 10/02/2023 Next OV: n/a Last Refill: 08/27/2023, #[id number] RF Database:   Please advise

## 2023-10-28 ENCOUNTER — Other Ambulatory Visit (HOSPITAL_BASED_OUTPATIENT_CLINIC_OR_DEPARTMENT_OTHER): Payer: Self-pay

## 2023-10-28 MED ORDER — TRAMADOL HCL 50 MG PO TABS
50.0000 mg | ORAL_TABLET | Freq: Four times a day (QID) | ORAL | 0 refills | Status: DC | PRN
  Filled 2023-10-28: qty 60, 8d supply, fill #0

## 2023-11-02 NOTE — Telephone Encounter (Signed)
 Patient is ready for scheduling on or after 11/25/23 BUY AND BILL  Out-of-pocket cost due at time of visit: $0  Primary: [place] [employer] [place] Prolia  co-insurance: 20% (approximately $331.87) Admin fee co-insurance: 20% (approximately $25)  Deductible: $257 of $257 met  Prior Auth: NOT required  Secondary: [place]  [employer] [place] Prolia  co-insurance: secondary plan will consider the Medicare Part B deductible and co-insurance. Admin fee co-insurance: secondary plan will consider the Medicare Part B deductible and co-insurance.  Deductible: secondary plan will consider the Medicare Part B deductible and co-insurance.  Prior Auth: NOT required PA# Valid:   ** This summary of benefits is an estimation of the patient's out-of-pocket cost. Exact cost may vary based on individual plan coverage.

## 2023-11-02 NOTE — Telephone Encounter (Signed)
 Prior Authorization NOT required for [facility]

## 2023-11-06 ENCOUNTER — Ambulatory Visit (INDEPENDENT_AMBULATORY_CARE_PROVIDER_SITE_OTHER)

## 2023-11-06 DIAGNOSIS — I480 Paroxysmal atrial fibrillation: Secondary | ICD-10-CM

## 2023-11-10 LAB — CUP PACEART REMOTE DEVICE CHECK
Battery Impedance: 6115 Ohm
Battery Remaining Longevity: 1 mo — CL
Battery Voltage: 2.6 V
Brady Statistic AP VP Percent: 5 %
Brady Statistic AP VS Percent: 0 %
Brady Statistic AS VP Percent: 95 %
Brady Statistic AS VS Percent: 0 %
Lead Channel Impedance Value: 384 Ohm
Lead Channel Pacing Threshold Amplitude: 0.625 V
Lead Channel Pacing Threshold Pulse Width: 0.4 ms
Lead Channel Pacing Threshold Pulse Width: 0.4 ms

## 2023-11-11 ENCOUNTER — Ambulatory Visit (HOSPITAL_BASED_OUTPATIENT_CLINIC_OR_DEPARTMENT_OTHER)
Admission: RE | Admit: 2023-11-11 | Discharge: 2023-11-11 | Disposition: A | Discharge status: Home / Self Care | Source: Ambulatory Visit | Attending: Family Medicine | Admitting: Family Medicine

## 2023-11-11 ENCOUNTER — Encounter: Payer: Self-pay | Admitting: Family Medicine

## 2023-11-11 ENCOUNTER — Encounter (HOSPITAL_BASED_OUTPATIENT_CLINIC_OR_DEPARTMENT_OTHER): Payer: Self-pay

## 2023-11-11 ENCOUNTER — Ambulatory Visit (INDEPENDENT_AMBULATORY_CARE_PROVIDER_SITE_OTHER): Admitting: Family Medicine

## 2023-11-11 VITALS — BP 110/80 | HR 91 | Temp 97.7°F | Resp 18 | Ht 62.99 in | Wt 141.6 lb

## 2023-11-11 DIAGNOSIS — Z1231 Encounter for screening mammogram for malignant neoplasm of breast: Secondary | ICD-10-CM | POA: Insufficient documentation

## 2023-11-11 NOTE — Progress Notes (Signed)
 Established Patient Office Visit  Subjective   Patient ID: [name], female    DOB: 03-28-1941  Age: 83 y.o. MRN: [id number]  Chief Complaint  Patient presents with   Breast Problem    Right breast, noticed the lump yesterdat    HPI Discussed the use of AI scribe software for clinical note transcription with the patient, who gave verbal consent to proceed.  History of Present Illness [name] "[name]" is an 83 year old female who presents with a concern about a lump near her breast bone.  She discovered the lump yesterday and is uncertain if it is related to her breast or breast bone. She describes the location as 'right here' and is unsure if it is related to her breast or breast bone.  She recalls getting sunburned recently while attending her grandson's graduation at [facility], where she was exposed to unexpected sun without protection.  She has not had a mammogram this year and is unsure when her last one was. She mentions a belief that at a certain age, mammograms might not be necessary anymore. She typically gets her mammograms downstairs and has previously had 3D mammograms.    Review of Systems  Constitutional:  Negative for chills, fever and malaise/fatigue.  HENT:  Negative for congestion and hearing loss.   Eyes:  Negative for blurred vision and discharge.  Respiratory:  Negative for cough, sputum production and shortness of breath.   Cardiovascular:  Negative for chest pain, palpitations and leg swelling.  Gastrointestinal:  Negative for abdominal pain, blood in stool, constipation, diarrhea, heartburn, nausea and vomiting.  Genitourinary:  Negative for dysuria, frequency, hematuria and urgency.  Musculoskeletal:  Negative for back pain, falls and myalgias.  Skin:  Negative for rash.  Neurological:  Negative for dizziness, sensory change, loss of consciousness, weakness and headaches.  Endo/Heme/Allergies:  Negative for environmental allergies.  Does not bruise/bleed easily.  Psychiatric/Behavioral:  Negative for depression and suicidal ideas. The patient is not nervous/anxious and does not have insomnia.       Objective:     BP 110/80 (BP Location: Left Arm, Patient Position: Sitting, Cuff Size: Normal)   Pulse 91   Temp 97.7 F (36.5 C) (Oral)   Resp 18   Ht 5' 2.99" (1.6 m)   Wt 141 lb 9.6 oz (64.2 kg)   SpO2 94%   BMI 25.09 kg/m  BP Readings from Last 3 Encounters:  11/11/23 110/80  10/02/23 120/70  09/19/23 128/70   Wt Readings from Last 3 Encounters:  11/11/23 141 lb 9.6 oz (64.2 kg)  10/02/23 141 lb 3.2 oz (64 kg)  09/19/23 141 lb 9.6 oz (64.2 kg)   SpO2 Readings from Last 3 Encounters:  11/11/23 94%  10/02/23 97%  09/19/23 97%      Physical Exam Vitals and nursing note reviewed.  Constitutional:      General: She is not in acute distress.    Appearance: Normal appearance. She is well-developed.  HENT:     Head: Normocephalic and atraumatic.  Eyes:     General: No scleral icterus.       Right eye: No discharge.        Left eye: No discharge.  Cardiovascular:     Rate and Rhythm: Normal rate and regular rhythm.     Heart sounds: No murmur heard. Pulmonary:     Effort: Pulmonary effort is normal. No respiratory distress.     Breath sounds: Normal breath sounds.  Chest:  Chest wall: No mass, deformity, swelling, tenderness or edema.  Breasts:    Right: Normal. No inverted nipple, mass, nipple discharge, skin change or tenderness.     Left: Normal. No inverted nipple, mass, nipple discharge, skin change or tenderness.  Musculoskeletal:        General: Normal range of motion.     Cervical back: Normal range of motion and neck supple.     Right lower leg: No edema.     Left lower leg: No edema.  Lymphadenopathy:     Upper Body:     Right upper body: No axillary adenopathy.     Left upper body: No axillary adenopathy.  Skin:    General: Skin is warm and dry.  Neurological:     General:  No focal deficit present.     Mental Status: She is alert and oriented to person, place, and time.  Psychiatric:        Mood and Affect: Mood normal.        Behavior: Behavior normal.        Thought Content: Thought content normal.        Judgment: Judgment normal.      No results found for any visits on 11/11/23.  Last CBC Lab Results  Component Value Date   WBC 5.4 01/06/2023   HGB 12.6 01/06/2023   HCT 39.2 01/06/2023   MCV 83.9 01/06/2023   MCH 27.5 06/06/2021   RDW 13.4 01/06/2023   PLT 177.0 01/06/2023   Last metabolic panel Lab Results  Component Value Date   GLUCOSE 90 01/06/2023   NA 134 (L) 01/06/2023   K 4.6 01/06/2023   CL 96 01/06/2023   CO2 29 01/06/2023   BUN 33 (H) 01/06/2023   CREATININE 1.02 01/06/2023   GFR 50.55 (L) 01/06/2023   CALCIUM  10.1 01/06/2023   PROT 7.0 01/06/2023   ALBUMIN 4.6 01/06/2023   BILITOT 0.5 01/06/2023   ALKPHOS 61 01/06/2023   AST 24 01/06/2023   ALT 16 01/06/2023   ANIONGAP 11 06/06/2021   Last lipids Lab Results  Component Value Date   CHOL 172 01/06/2023   HDL 47.20 01/06/2023   LDLCALC 107 (H) 01/06/2023   TRIG 89.0 01/06/2023   CHOLHDL 4 01/06/2023   Last hemoglobin A1c Lab Results  Component Value Date   HGBA1C 5.5 06/07/2016   Last thyroid  functions Lab Results  Component Value Date   TSH 4.19 01/06/2023   T4TOTAL 12.3 03/10/2012   Last vitamin D  Lab Results  Component Value Date   VD25OH 78 05/27/2023   Last vitamin B12 and Folate Lab Results  Component Value Date   VITAMINB12 517 12/11/2020      The ASCVD Risk score (Arnett DK, et al., 2019) failed to calculate for the following reasons:   The 2019 ASCVD risk score is only valid for ages 82 to 83    Assessment & Plan:   Problem List Items Addressed This Visit   None Visit Diagnoses       Screening mammogram for breast cancer    -  Primary   Relevant Orders   MM 3D SCREENING MAMMOGRAM BILATERAL BREAST     Assessment and  Plan Assessment & Plan Breast lump concern   She reports a lump in the chest area, likely related to the sternum rather than the breast. Examination reveals no palpable breast lump. She has not had a recent mammogram. Schedule a 3D mammogram for reassurance, as the area feels like bone  rather than a lump. Advise her to schedule the mammogram at her convenience.    No follow-ups on file.    Breena Bevacqua R Lowne Chase, DO

## 2023-11-13 ENCOUNTER — Other Ambulatory Visit: Payer: Self-pay | Admitting: Family Medicine

## 2023-11-13 ENCOUNTER — Other Ambulatory Visit (HOSPITAL_BASED_OUTPATIENT_CLINIC_OR_DEPARTMENT_OTHER): Payer: Self-pay

## 2023-11-13 DIAGNOSIS — M542 Cervicalgia: Secondary | ICD-10-CM

## 2023-11-13 MED ORDER — LEVOCETIRIZINE DIHYDROCHLORIDE 5 MG PO TABS
5.0000 mg | ORAL_TABLET | Freq: Every evening | ORAL | 1 refills | Status: DC
  Filled 2023-11-13: qty 90, 90d supply, fill #0
  Filled 2024-03-07: qty 90, 90d supply, fill #1

## 2023-11-14 ENCOUNTER — Other Ambulatory Visit: Payer: Self-pay | Admitting: Family Medicine

## 2023-11-14 DIAGNOSIS — R928 Other abnormal and inconclusive findings on diagnostic imaging of breast: Secondary | ICD-10-CM

## 2023-11-15 ENCOUNTER — Other Ambulatory Visit: Payer: Self-pay | Admitting: Internal Medicine

## 2023-11-15 DIAGNOSIS — I10 Essential (primary) hypertension: Secondary | ICD-10-CM

## 2023-11-17 ENCOUNTER — Other Ambulatory Visit (HOSPITAL_BASED_OUTPATIENT_CLINIC_OR_DEPARTMENT_OTHER): Payer: Self-pay

## 2023-11-20 ENCOUNTER — Other Ambulatory Visit (HOSPITAL_BASED_OUTPATIENT_CLINIC_OR_DEPARTMENT_OTHER): Payer: Self-pay

## 2023-11-25 ENCOUNTER — Ambulatory Visit
Admission: RE | Admit: 2023-11-25 | Discharge: 2023-11-25 | Discharge status: Home / Self Care | Source: Ambulatory Visit | Attending: Family Medicine | Admitting: Family Medicine

## 2023-11-25 ENCOUNTER — Ambulatory Visit
Admission: RE | Admit: 2023-11-25 | Discharge: 2023-11-25 | Disposition: A | Discharge status: Home / Self Care | Source: Ambulatory Visit | Attending: Family Medicine | Admitting: Family Medicine

## 2023-11-25 ENCOUNTER — Encounter: Payer: Self-pay | Admitting: Family Medicine

## 2023-11-25 ENCOUNTER — Other Ambulatory Visit

## 2023-11-25 DIAGNOSIS — R928 Other abnormal and inconclusive findings on diagnostic imaging of breast: Secondary | ICD-10-CM

## 2023-11-26 ENCOUNTER — Other Ambulatory Visit: Payer: Self-pay | Admitting: Family Medicine

## 2023-11-26 ENCOUNTER — Ambulatory Visit (INDEPENDENT_AMBULATORY_CARE_PROVIDER_SITE_OTHER)

## 2023-11-26 DIAGNOSIS — N6310 Unspecified lump in the right breast, unspecified quadrant: Secondary | ICD-10-CM

## 2023-11-26 DIAGNOSIS — R921 Mammographic calcification found on diagnostic imaging of breast: Secondary | ICD-10-CM

## 2023-11-26 DIAGNOSIS — M810 Age-related osteoporosis without current pathological fracture: Secondary | ICD-10-CM

## 2023-11-26 MED ORDER — DENOSUMAB 60 MG/ML ~~LOC~~ SOSY
60.0000 mg | PREFILLED_SYRINGE | Freq: Once | SUBCUTANEOUS | Status: AC
  Administered 2024-06-11: 60 mg via SUBCUTANEOUS

## 2023-11-26 NOTE — Progress Notes (Signed)
 Pt was in our office today for a Prolia  injection given in the RIGHT arm with no problem. I advise pt to wait in the lobby for 30 mins after incase she has a reaction to the injection.

## 2023-11-26 NOTE — Addendum Note (Signed)
 Addended by: Edra Govern D on: 11/26/2023 03:40 PM   Modules accepted: Orders, Level of Service

## 2023-11-26 NOTE — Progress Notes (Signed)
 Remote pacemaker transmission.

## 2023-11-27 ENCOUNTER — Other Ambulatory Visit: Payer: Self-pay | Admitting: Internal Medicine

## 2023-11-27 DIAGNOSIS — I10 Essential (primary) hypertension: Secondary | ICD-10-CM

## 2023-11-30 NOTE — Telephone Encounter (Signed)
 Last Prolia  inj 11/26/23 Next Prolia  inj due 05/29/24

## 2023-12-02 ENCOUNTER — Encounter: Payer: Self-pay | Admitting: Internal Medicine

## 2023-12-03 ENCOUNTER — Ambulatory Visit
Admission: RE | Admit: 2023-12-03 | Discharge: 2023-12-03 | Disposition: A | Discharge status: Home / Self Care | Source: Ambulatory Visit | Attending: Family Medicine | Admitting: Family Medicine

## 2023-12-03 DIAGNOSIS — R921 Mammographic calcification found on diagnostic imaging of breast: Secondary | ICD-10-CM

## 2023-12-03 DIAGNOSIS — N6310 Unspecified lump in the right breast, unspecified quadrant: Secondary | ICD-10-CM

## 2023-12-03 HISTORY — PX: BREAST BIOPSY: SHX20

## 2023-12-04 LAB — SURGICAL PATHOLOGY

## 2023-12-05 ENCOUNTER — Telehealth: Payer: Self-pay | Admitting: *Deleted

## 2023-12-05 NOTE — Telephone Encounter (Signed)
 Spoke to patient to confirm upcoming morning [facility] clinic appointment on 6/11, paperwork will be sent via mail  Gave location and time, also informed patient that the surgeon's office would be calling as well to get information from them similar to the packet that they will be receiving so make sure to do both.  Reminded patient that all providers will be coming to the clinic to see them HERE and if they had any questions to not hesitate to reach back out to myself or their navigators.

## 2023-12-06 NOTE — H&P (View-Only) (Signed)
 Electrophysiology Office Note:   Date:  12/08/2023  ID:  Daisy, [name] 04-22-1941, MRN [id number]  Primary Cardiologist: None Primary Heart Failure: None Electrophysiologist: Richardo Chandler, MD       History of Present Illness:   [name] is a 83 y.o. female with h/o CHB s/p PPM, AF, NSVT, HTN, CKD III, renal cancer s/p L nephrectomy, hypothyroidism s/p thyroidectomy seen today for routine electrophysiology followup.   Since last being seen in our clinic the patient reports she has noted increased shortness of breath for her in the last few weeks which is unusual.  She asks if her pacemaker could be contributing.  She reports that she was recently diagnosed with breast cancer and is pending a surgery on her right breast.  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  She was referred to [facility] surgery.  She is pending surgical consultation on Wednesday 12/10/23 with [facility].  She denies chest pain, palpitations, dyspnea, PND, orthopnea, nausea, vomiting, dizziness, syncope, edema, weight gain, or early satiety.   Review of systems complete and found to be negative unless listed in HPI.   EP Information / Studies Reviewed:    EKG is ordered today. Personal review as below.  EKG Interpretation Date/Time:  Monday December 08 2023 10:35:34 EDT Ventricular Rate:  65 PR Interval:    QRS Duration:  156 QT Interval:  472 QTC Calculation: 490 R Axis:   -46  Text Interpretation: Sinus rhythm with complete heart block and Ventricular-paced rhythm Confirmed by Creighton Doffing ([id number]) on 12/08/2023 11:06:08 AM   PPM Interrogation-  reviewed in detail today,  See PACEART report.  Device History: Medtronic Dual Chamber PPM implanted 05/11/03 for CHB Generator Change > 09/16/11  Mixed Lead System. Programmed Unipolar (ventricular failure to  pace / polarity switch > in Dr. Doyle Generous notes for years) Dependent   Studies:  ECHO 01/2022 > LVEF 60-65%, G2DD, LA/RA mildly dilated, mild-mod MV regurgitation  Risk Assessment/Calculations:    CHA2DS2-VASc Score = 6   This indicates a 9.7% annual risk of stroke. The patient's score is based upon: CHF History: 1 HTN History: 1 Diabetes History: 0 Stroke History: 0 Vascular Disease History: 1 Age Score: 2 Gender Score: 1             Physical Exam:   VS:  BP 128/72   Pulse 68   Ht 5\' 3"  (1.6 m)   Wt 140 lb (63.5 kg)   SpO2 98%   BMI 24.80 kg/m    Wt Readings from Last 3 Encounters:  12/08/23 140 lb (63.5 kg)  11/11/23 141 lb 9.6 oz (64.2 kg)  10/02/23 141 lb 3.2 oz (64 kg)     GEN: Well nourished, well developed in no acute distress NECK: No JVD; No carotid bruits CARDIAC: Regular rate and rhythm, no murmurs, rubs, gallops RESPIRATORY:  Clear to auscultation without rales, wheezing or rhonchi  ABDOMEN: Soft, non-tender, non-distended EXTREMITIES:  No edema; No deformity   ASSESSMENT AND PLAN:    CHB s/p Medtronic PPM  -patient's device at ERI as of 11/20/2023 > device has transition to VVI 65 which would most likely explain her dyspnea -See Pace Art report -plan for expeditious generator change to allow for further workup regarding her breast cancer.  In current programming, there would be unable to do any temporary programming for surgery if warranted. -pt reviewed by Dr.  Carolynne Citron, will plan for generator change on 12/09/2023. -patient instructed to hold Eliquis  this evening and tomorrow morning for procedure  Paroxysmal Atrial Fibrillation  CHA2DS2-VASc 6 -Unable to assess burden on device -Eliquis  held as above  Secondary Hypercoagulable State  -continue Eliqius 5mg  BID, dose reviewed and appropriate by wt / Cr   Hypertension  -well controlled on current regimen    Disposition:   Follow up with Dr. Carolynne Citron on 6/10 as planned for generator change    Signed, Creighton Doffing, NP-C, AGACNP-BC [facility] - Electrophysiology  12/08/2023, 1:22 PM

## 2023-12-06 NOTE — Progress Notes (Addendum)
 Electrophysiology Office Note:   Date:  12/08/2023  ID:  Agapita, Savarino 23-Dec-1940, MRN [id number]  Primary Cardiologist: None Primary Heart Failure: None Electrophysiologist: Elspeth Sage, MD       History of Present Illness:   [name] is a 83 y.o. female with h/o CHB s/p PPM, AF, NSVT, HTN, CKD III, renal cancer s/p L nephrectomy, hypothyroidism s/p thyroidectomy seen today for routine electrophysiology followup.   Since last being seen in our clinic the patient reports she has noted increased shortness of breath for her in the last few weeks which is unusual.  She asks if her pacemaker could be contributing.  She reports that she was recently diagnosed with breast cancer and is pending a surgery on her right breast.  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  She was referred to [facility] surgery.  She is pending surgical consultation on Wednesday 12/10/23 with [facility].  She denies chest pain, palpitations, dyspnea, PND, orthopnea, nausea, vomiting, dizziness, syncope, edema, weight gain, or early satiety.   Review of systems complete and found to be negative unless listed in HPI.   EP Information / Studies Reviewed:    EKG is ordered today. Personal review as below.  EKG Interpretation Date/Time:  Monday December 08 2023 10:35:34 EDT Ventricular Rate:  65 PR Interval:    QRS Duration:  156 QT Interval:  472 QTC Calculation: 490 R Axis:   -46  Text Interpretation: Sinus rhythm with complete heart block and Ventricular-paced rhythm Confirmed by Aniceto Jarvis ([id number]) on 12/08/2023 11:06:08 AM   PPM Interrogation-  reviewed in detail today,  See PACEART report.  Device History: Medtronic Dual Chamber PPM implanted 05/11/03 for CHB Generator Change > 09/16/11  Mixed Lead System. Programmed Unipolar (ventricular failure to  pace / polarity switch > in Dr. Celine notes for years) Dependent   Studies:  ECHO 01/2022 > LVEF 60-65%, G2DD, LA/RA mildly dilated, mild-mod MV regurgitation  Risk Assessment/Calculations:    CHA2DS2-VASc Score = 6   This indicates a 9.7% annual risk of stroke. The patient's score is based upon: CHF History: 1 HTN History: 1 Diabetes History: 0 Stroke History: 0 Vascular Disease History: 1 Age Score: 2 Gender Score: 1             Physical Exam:   VS:  BP 128/72   Pulse 68   Ht 5' 3 (1.6 m)   Wt 140 lb (63.5 kg)   SpO2 98%   BMI 24.80 kg/m    Wt Readings from Last 3 Encounters:  12/08/23 140 lb (63.5 kg)  11/11/23 141 lb 9.6 oz (64.2 kg)  10/02/23 141 lb 3.2 oz (64 kg)     GEN: Well nourished, well developed in no acute distress NECK: No JVD; No carotid bruits CARDIAC: Regular rate and rhythm, no murmurs, rubs, gallops RESPIRATORY:  Clear to auscultation without rales, wheezing or rhonchi  ABDOMEN: Soft, non-tender, non-distended EXTREMITIES:  No edema; No deformity   ASSESSMENT AND PLAN:    CHB s/p Medtronic PPM  -patient's device at ERI as of 11/20/2023 > device has transition to VVI 65 which would most likely explain her dyspnea -See Pace Art report -plan for expeditious generator change to allow for further workup regarding her breast cancer.  In current programming, there would be unable to do any temporary programming for surgery if warranted. -pt reviewed by Dr.  Waddell, will plan for generator change on 12/09/2023. -patient instructed to hold Eliquis  this evening and tomorrow morning for procedure  Paroxysmal Atrial Fibrillation  CHA2DS2-VASc 6 -Unable to assess burden on device -Eliquis  held as above  Secondary Hypercoagulable State  -continue Eliqius 5mg  BID, dose reviewed and appropriate by wt / Cr   Hypertension  -well controlled on current regimen    Disposition:   Follow up with Dr. Waddell on 6/10 as planned for generator change    Signed, Daphne Barrack, NP-C, AGACNP-BC [facility] - Electrophysiology  12/08/2023, 1:22 PM   Addendum 6/30 for Surgical Clearance   Pre-Operative Clearance for Lumpectomy  Request for Surgical Clearance    Procedure:  lumpectomy    Date of Surgery:  Clearance 01/06/24                                  Surgeon:  Debby Shipper, MD  Surgeon's Group or Practice Name:  [facility]  Phone number:  [phone] Fax number:  [phone]  Type of Clearance Requested:   - Medical  - Pharmacy:  Hold Apixaban  (Eliquis ) Not indicated  Type of Anesthesia:  General       Ms. Weill's perioperative risk of a major cardiac event is 0.4% according to the Revised Cardiac Risk Index (RCRI).  Therefore, she is at low risk for perioperative complications.     Recommendations: According to ACC/AHA guidelines, no further cardiovascular testing needed.  The patient may proceed to surgery at acceptable risk.    Antiplatelet and/or Anticoagulation Recommendations:  Eliquis  (Apixaban ) can be held for 2 days prior to surgery.  Please resume post op when felt to be safe.     Daphne Barrack, NP-C, AGACNP-BC [facility] - Electrophysiology  12/29/2023, 1:11 PM

## 2023-12-08 ENCOUNTER — Other Ambulatory Visit (HOSPITAL_BASED_OUTPATIENT_CLINIC_OR_DEPARTMENT_OTHER): Payer: Self-pay

## 2023-12-08 ENCOUNTER — Encounter

## 2023-12-08 ENCOUNTER — Encounter: Payer: Self-pay | Admitting: Pulmonary Disease

## 2023-12-08 ENCOUNTER — Other Ambulatory Visit: Payer: Self-pay | Admitting: Family Medicine

## 2023-12-08 ENCOUNTER — Ambulatory Visit: Attending: Pulmonary Disease | Admitting: Pulmonary Disease

## 2023-12-08 ENCOUNTER — Encounter: Payer: Self-pay | Admitting: *Deleted

## 2023-12-08 VITALS — BP 128/72 | HR 68 | Ht 63.0 in | Wt 140.0 lb

## 2023-12-08 DIAGNOSIS — M546 Pain in thoracic spine: Secondary | ICD-10-CM

## 2023-12-08 DIAGNOSIS — I442 Atrioventricular block, complete: Secondary | ICD-10-CM

## 2023-12-08 DIAGNOSIS — Z950 Presence of cardiac pacemaker: Secondary | ICD-10-CM | POA: Diagnosis present

## 2023-12-08 DIAGNOSIS — I10 Essential (primary) hypertension: Secondary | ICD-10-CM

## 2023-12-08 DIAGNOSIS — I480 Paroxysmal atrial fibrillation: Secondary | ICD-10-CM | POA: Diagnosis not present

## 2023-12-08 DIAGNOSIS — D6869 Other thrombophilia: Secondary | ICD-10-CM | POA: Diagnosis present

## 2023-12-08 DIAGNOSIS — Z170 Estrogen receptor positive status [ER+]: Secondary | ICD-10-CM | POA: Insufficient documentation

## 2023-12-08 DIAGNOSIS — C50811 Malignant neoplasm of overlapping sites of right female breast: Secondary | ICD-10-CM | POA: Insufficient documentation

## 2023-12-08 DIAGNOSIS — M545 Low back pain, unspecified: Secondary | ICD-10-CM

## 2023-12-08 LAB — CUP PACEART INCLINIC DEVICE CHECK
Date Time Interrogation Session: 20250609131810
Implantable Lead Connection Status: 753985
Implantable Lead Connection Status: 753985
Implantable Lead Implant Date: 20041110
Implantable Lead Implant Date: 20041110
Implantable Lead Location: 753859
Implantable Lead Location: 753860
Implantable Pulse Generator Implant Date: 20130318

## 2023-12-08 MED ORDER — TRAMADOL HCL 50 MG PO TABS
50.0000 mg | ORAL_TABLET | Freq: Four times a day (QID) | ORAL | 0 refills | Status: DC | PRN
  Filled 2023-12-08: qty 60, 8d supply, fill #0

## 2023-12-08 NOTE — Patient Instructions (Addendum)
 Medication Instructions:   FOLLOW INSTRUCTIONS FOR  MEDICATIONS  FOR PROCEDURE  12-09-23   *If you need a refill on your cardiac medications before your next appointment, please call your pharmacy*   Lab Work:   PLEASE GO DOWN STAIRS  [facility]  FIRST FLOOR   ( GET OFF ELEVATORS WALK TOWARDS WAITING AREA LAB LOCATED BY PHARMACY): CBC AND BMET TODAY     If you have labs (blood work) drawn today and your tests are completely normal, you will receive your results only by: MyChart Message (if you have MyChart) OR A paper copy in the mail If you have any lab test that is abnormal or we need to change your treatment, we will call you to review the results    Testing/Procedures:  SEE LETTER AND INSTRUCTION FOR DEVICE CHANGE     Follow-Up: At [facility], you and your health needs are our priority.  As part of our continuing mission to provide you with exceptional heart care, our providers are all part of one team.  This team includes your primary Cardiologist (physician) and Advanced Practice Providers or APPs (Physician Assistants and Nurse Practitioners) who all work together to provide you with the care you need, when you need it.  Your next appointment:    3 MONTHS AFTER 12-09-23  YOUR PROCEDURE SOMEONE FROM SCHEDULING TEAM WILL CONTACT YOU FOR FOLLOW UP APPOINTMENT   Provider:   You may see  the following Advanced Practice Providers on your designated  Care Team:    Creighton Doffing, NP   We recommend signing up for the patient portal called "MyChart".  Sign up information is provided on this After Visit Summary.  MyChart is used to connect with patients for Virtual Visits (Telemedicine).  Patients are able to view lab/test results, encounter notes, upcoming appointments, etc.  Non-urgent messages can be sent to your provider as well.   To learn more about what you can do with MyChart, go to [email or web address].   Other Instructions

## 2023-12-08 NOTE — Telephone Encounter (Signed)
 Requesting: tramadol  50mg   Contract: None UDS: None Last Visit: 11/11/23 Next Visit: None Last Refill: 10/28/23 #60 and 0RF   Please Advise

## 2023-12-09 ENCOUNTER — Encounter (HOSPITAL_COMMUNITY): Payer: Self-pay | Admitting: Internal Medicine

## 2023-12-09 ENCOUNTER — Other Ambulatory Visit: Payer: Self-pay

## 2023-12-09 ENCOUNTER — Ambulatory Visit: Payer: Self-pay | Admitting: Pulmonary Disease

## 2023-12-09 ENCOUNTER — Encounter (HOSPITAL_COMMUNITY)
Admission: RE | Disposition: A | Discharge status: Home / Self Care | Payer: Self-pay | Source: Home / Self Care | Attending: Internal Medicine

## 2023-12-09 ENCOUNTER — Ambulatory Visit (HOSPITAL_COMMUNITY)
Admission: RE | Admit: 2023-12-09 | Discharge: 2023-12-09 | Disposition: A | Discharge status: Home / Self Care | Attending: Internal Medicine | Admitting: Internal Medicine

## 2023-12-09 DIAGNOSIS — I480 Paroxysmal atrial fibrillation: Secondary | ICD-10-CM | POA: Diagnosis not present

## 2023-12-09 DIAGNOSIS — Z45010 Encounter for checking and testing of cardiac pacemaker pulse generator [battery]: Secondary | ICD-10-CM | POA: Diagnosis present

## 2023-12-09 DIAGNOSIS — D6869 Other thrombophilia: Secondary | ICD-10-CM | POA: Diagnosis not present

## 2023-12-09 DIAGNOSIS — I129 Hypertensive chronic kidney disease with stage 1 through stage 4 chronic kidney disease, or unspecified chronic kidney disease: Secondary | ICD-10-CM | POA: Insufficient documentation

## 2023-12-09 DIAGNOSIS — N183 Chronic kidney disease, stage 3 unspecified: Secondary | ICD-10-CM | POA: Diagnosis not present

## 2023-12-09 DIAGNOSIS — E890 Postprocedural hypothyroidism: Secondary | ICD-10-CM | POA: Diagnosis not present

## 2023-12-09 DIAGNOSIS — I442 Atrioventricular block, complete: Secondary | ICD-10-CM | POA: Diagnosis not present

## 2023-12-09 DIAGNOSIS — Z902 Acquired absence of lung [part of]: Secondary | ICD-10-CM | POA: Insufficient documentation

## 2023-12-09 HISTORY — PX: PPM GENERATOR CHANGEOUT: EP1233

## 2023-12-09 LAB — BASIC METABOLIC PANEL WITH GFR
BUN/Creatinine Ratio: 16 (ref 12–28)
BUN: 17 mg/dL (ref 8–27)
CO2: 17 mmol/L — ABNORMAL LOW (ref 20–29)
Calcium: 9.9 mg/dL (ref 8.7–10.3)
Chloride: 99 mmol/L (ref 96–106)
Creatinine, Ser: 1.06 mg/dL — ABNORMAL HIGH (ref 0.57–1.00)
Glucose: 95 mg/dL (ref 70–99)
Potassium: 3.8 mmol/L (ref 3.5–5.2)
Sodium: 139 mmol/L (ref 134–144)
eGFR: 51 mL/min/{1.73_m2} — ABNORMAL LOW (ref >59)

## 2023-12-09 LAB — CBC
Hematocrit: 43.6 % (ref 34.0–46.6)
Hemoglobin: 14.2 g/dL (ref 11.1–15.9)
MCH: 28.7 pg (ref 26.6–33.0)
MCHC: 32.6 g/dL (ref 31.5–35.7)
MCV: 88 fL (ref 79–97)
Platelets: 163 10*3/uL (ref 150–450)
RBC: 4.94 x10E6/uL (ref 3.77–5.28)
RDW: 13.2 % (ref 11.7–15.4)
WBC: 6.9 10*3/uL (ref 3.4–10.8)

## 2023-12-09 SURGERY — PPM GENERATOR CHANGEOUT
Anesthesia: LOCAL

## 2023-12-09 MED ORDER — CEFAZOLIN SODIUM-DEXTROSE 2-4 GM/100ML-% IV SOLN: 2.0000 g | INTRAVENOUS | Status: AC

## 2023-12-09 MED ORDER — MIDAZOLAM HCL 2 MG/2ML IJ SOLN
INTRAMUSCULAR | Status: AC
  Filled 2023-12-09: qty 2

## 2023-12-09 MED ORDER — MIDAZOLAM HCL 5 MG/5ML IJ SOLN
INTRAMUSCULAR | Status: DC | PRN
  Administered 2023-12-09: 1 mg via INTRAVENOUS

## 2023-12-09 MED ORDER — POVIDONE-IODINE 10 % EX SWAB: 2.0000 | Freq: Once | CUTANEOUS | Status: DC

## 2023-12-09 MED ORDER — ACETAMINOPHEN 325 MG PO TABS: 325.0000 mg | ORAL_TABLET | ORAL | Status: DC | PRN

## 2023-12-09 MED ORDER — SODIUM CHLORIDE 0.9 % IV SOLN: INTRAVENOUS | Status: DC

## 2023-12-09 MED ORDER — SODIUM CHLORIDE 0.9 % IV SOLN
INTRAVENOUS | Status: AC
  Administered 2023-12-09: 80 mg
  Filled 2023-12-09: qty 2

## 2023-12-09 MED ORDER — ONDANSETRON HCL 4 MG/2ML IJ SOLN: 4.0000 mg | Freq: Four times a day (QID) | INTRAMUSCULAR | Status: DC | PRN

## 2023-12-09 MED ORDER — CEFAZOLIN SODIUM-DEXTROSE 2-4 GM/100ML-% IV SOLN
INTRAVENOUS | Status: AC
  Administered 2023-12-09: 2 g via INTRAVENOUS
  Filled 2023-12-09: qty 100

## 2023-12-09 MED ORDER — FENTANYL CITRATE (PF) 100 MCG/2ML IJ SOLN
INTRAMUSCULAR | Status: DC | PRN
Start: 2023-12-09 — End: 2023-12-09
  Administered 2023-12-09: 25 ug via INTRAVENOUS

## 2023-12-09 MED ORDER — FENTANYL CITRATE (PF) 100 MCG/2ML IJ SOLN
INTRAMUSCULAR | Status: DC | PRN
Start: 2023-12-09 — End: 2023-12-09
  Administered 2023-12-09: 12.5 ug via INTRAVENOUS

## 2023-12-09 MED ORDER — FENTANYL CITRATE (PF) 100 MCG/2ML IJ SOLN
INTRAMUSCULAR | Status: AC
Start: 2023-12-09 — End: ?
  Filled 2023-12-09: qty 2

## 2023-12-09 MED ORDER — CHLORHEXIDINE GLUCONATE 4 % EX SOLN: 4.0000 | Freq: Once | CUTANEOUS | Status: DC

## 2023-12-09 MED ORDER — LIDOCAINE HCL (PF) 1 % IJ SOLN
INTRAMUSCULAR | Status: DC | PRN
Start: 2023-12-09 — End: 2023-12-09
  Administered 2023-12-09: 50 mL

## 2023-12-09 MED ORDER — SODIUM CHLORIDE 0.9 % IV SOLN: 80.0000 mg | INTRAVENOUS | Status: AC

## 2023-12-09 SURGICAL SUPPLY — 6 items
CABLE SURGICAL S-101-97-12 (CABLE) ×1 IMPLANT
IPG PACE AZUR XT DR MRI W1DR01 (Pacemaker) IMPLANT
PAD DEFIB RADIO PHYSIO CONN (PAD) ×1 IMPLANT
POUCH AIGIS-R ANTIBACT PPM (Mesh General) ×1 IMPLANT
POUCH AIGIS-R ANTIBACT PPM MED (Mesh General) IMPLANT
TRAY PACEMAKER INSERTION (PACKS) ×1 IMPLANT

## 2023-12-09 NOTE — H&P (Signed)
 Electrophysiology Office Note:   Date:  12/08/2023  ID:  Zephyr, Sausedo [birth year], MRN [id number]   Primary Cardiologist: None Primary Heart Failure: None Electrophysiologist: Richardo Chandler, MD        History of Present Illness:   [name] is a 83 y.o. female with h/o CHB s/p PPM, AF, NSVT, HTN, CKD III, renal cancer s/p L nephrectomy, hypothyroidism s/p thyroidectomy seen today for routine electrophysiology followup.    Since last being seen in our clinic the patient reports she has noted increased shortness of breath for her in the last few weeks which is unusual.  She asks if her pacemaker could be contributing.  She reports that she was recently diagnosed with breast cancer and is pending a surgery on her right breast.  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  She was referred to [facility] surgery.  She is pending surgical consultation on Wednesday 12/10/23 with [facility].   She denies chest pain, palpitations, dyspnea, PND, orthopnea, nausea, vomiting, dizziness, syncope, edema, weight gain, or early satiety.    Review of systems complete and found to be negative unless listed in HPI.    EP Information / Studies Reviewed:     EKG is ordered today. Personal review as below.   EKG Interpretation Date/Time:                  Monday December 08 2023 10:35:34 EDT Ventricular Rate:         65 PR Interval:                   QRS Duration:             156 QT Interval:                 472 QTC Calculation:490 R Axis:                         -46   Text Interpretation:Sinus rhythm with complete heart block and Ventricular-paced rhythm Confirmed by Creighton Doffing ([id number]) on 12/08/2023 11:06:08 AM    PPM Interrogation-  reviewed in detail today,  See PACEART report.   Device History: Medtronic Dual Chamber PPM implanted 05/11/03 for  CHB Generator Change > 09/16/11  Mixed Lead System. Programmed Unipolar (ventricular failure to pace / polarity switch > in Dr. Doyle Generous notes for years) Dependent    Studies:  ECHO 01/2022 > LVEF 60-65%, G2DD, LA/RA mildly dilated, mild-mod MV regurgitation   Risk Assessment/Calculations:     CHA2DS2-VASc Score = 6   This indicates a 9.7% annual risk of stroke. The patient's score is based upon: CHF History: 1 HTN History: 1 Diabetes History: 0 Stroke History: 0 Vascular Disease History: 1 Age Score: 2 Gender Score: 1               Physical Exam:   VS:  BP 128/72   Pulse 68   Ht 5\' 3"  (1.6 m)   Wt 140 lb (63.5 kg)   SpO2 98%   BMI 24.80 kg/m       Wt Readings from Last 3 Encounters:  12/08/23 140 lb (63.5 kg)  11/11/23 141 lb 9.6 oz (64.2 kg)  10/02/23 141 lb 3.2 oz (64 kg)      GEN: Well nourished, well developed in no acute distress NECK: No JVD; No carotid  bruits CARDIAC: Regular rate and rhythm, no murmurs, rubs, gallops RESPIRATORY:  Clear to auscultation without rales, wheezing or rhonchi  ABDOMEN: Soft, non-tender, non-distended EXTREMITIES:  No edema; No deformity    ASSESSMENT AND PLAN:     CHB s/p Medtronic PPM  -patient's device at ERI as of 11/20/2023 > device has transition to VVI 65 which would most likely explain her dyspnea -See Pace Art report -plan for expeditious generator change to allow for further workup regarding her breast cancer.  In current programming, there would be unable to do any temporary programming for surgery if warranted. -pt reviewed by Dr. Carolynne Citron, will plan for generator change on 12/09/2023. -patient instructed to hold Eliquis  this evening and tomorrow morning for procedure   Paroxysmal Atrial Fibrillation  CHA2DS2-VASc 6 -Unable to assess burden on device -Eliquis  held as above   Secondary Hypercoagulable State  -continue Eliqius 5mg  BID, dose reviewed and appropriate by wt / Cr    Hypertension  -well controlled on  current regimen     Disposition:   Follow up with Dr. Carolynne Citron on 6/10 as planned for generator change    Signed, Creighton Doffing, NP-C, AGACNP-BC [facility] - Electrophysiology  12/08/2023, 1:22 PM       EP Attending  Patient seen and examined. I saw her yesterday in the office. 83 yo woman with CHB who reached ERI and VVI pacing mode in the setting of CHB. She presents today for PPM gen change out. I have reviewed the indications/risks/benefits/goals/expectations of the procedure and she wishes to proceed.  Daisy Brand Gabrille Kilbride,MD

## 2023-12-09 NOTE — Interval H&P Note (Signed)
 History and Physical Interval Note:  12/09/2023 8:26 AM  [name]  has presented today for surgery, with the diagnosis of ERI.  The various methods of treatment have been discussed with the patient and family. After consideration of risks, benefits and other options for treatment, the patient has consented to  Procedure(s): PPM GENERATOR CHANGEOUT (N/A) as a surgical intervention.  The patient's history has been reviewed, patient examined, no change in status, stable for surgery.  I have reviewed the patient's chart and labs.  Questions were answered to the patient's satisfaction.     Manya Sells

## 2023-12-09 NOTE — Discharge Instructions (Signed)

## 2023-12-09 NOTE — Progress Notes (Signed)
 D/C instructions given to patient and her daughter. Per Carolynne Citron, MD, patient to remove pressure dressing on Thursday. Relayed to patient and family, verbalized understanding

## 2023-12-09 NOTE — Progress Notes (Signed)
 Radiation Oncology         [phone] ________________________________  Initial Outpatient Consultation  Name: [name] MRN: [id number]  Date: 12/10/2023  DOB: [birth year]  WU:[id number] Chase, Candida Chalk, DO  Sim Dryer, MD   REFERRING PHYSICIAN: Sim Dryer, MD  DIAGNOSIS: No diagnosis found.   Cancer Staging  No matching staging information was found for the patient.   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 female who presented with bilateral breast abnormalities on the following imaging: bilateral screening mammogram on the date of 11/11/23. No symptoms, if any, were reported at that time. A bilateral diagnostic mammogram and right breast ultrasound were subsequently performed on 11/25/23 which further revealed two suspicious masses in the 2 o'clock right breast located 10 cmfn and 5 cmfn, measuring 1.7 x 1.4 x 1.5 cm and 1.5 x 0.9 x 1.2 cm, respectively. An area of indeterminate grouped heterogenous calcifications were also demonstrated in the upper outer right breast spanning approximately 8 mm. Imaging otherwise showed no evidence of right axillary lymphadenopathy and no evidence of malignancy in the left breast.   Biopsies collected on 12/03/23 are detailed as follows:  -- Biopsy of the 2 o'clock right breast mass located 5 cmfn showed grade 2 invasive ductal carcinoma measuring 9 mm in the greatest linear extent of the sample with intermediate grade DCIS and calcifications. ER status: 2% positive with strong staining intensity; PR status 95% positive with strong staining intensity; Proliferation marker Ki67 at 5%; Her2 status negative; Grade 2. -- Biopsy of the 2 o'clock right breast mass located 10 cmfn showed grade 2 invasive ductal carcinoma measuring 8 mm in the  greatest linear extent of the sample with calcifications present.   -- No lymph nodes were examined.   Of note: she has pace maker which was changed out yesterday.   ***  PREVIOUS RADIATION THERAPY: No  PAST MEDICAL HISTORY:  has a past medical history of Allergy, Atrioventricular block, complete (HCC), Cardiac pacemaker in situ, Contact dermatitis and eczema, Dairy product intolerance, Depression, Diastolic dysfunction, GERD (gastroesophageal reflux disease), Hiatal hernia, History of diverticulitis of colon, History of kidney stones, History of peripheral edema, History of thyroid  nodule, HTN (hypertension), Hydronephrosis, left, Hypothyroidism, Mild sleep apnea, Nocturia, NSVT (nonsustained ventricular tachycardia) (HCC), Osteoporosis, PAF (paroxysmal atrial fibrillation) (HCC), PONV (postoperative nausea and vomiting), Pulmonary nodules, Renal cell carcinoma of left kidney (HCC), and Superficial thrombophlebitis.    PAST SURGICAL HISTORY: Past Surgical History:  Procedure Laterality Date   BREAST BIOPSY Right 12/03/2023   MM RT BREAST BX W LOC DEV 1ST LESION IMAGE BX SPEC STEREO GUIDE 12/03/2023 [facility]   BREAST BIOPSY Right 12/03/2023   US  RT BREAST BX W LOC DEV EA ADD LESION IMG BX SPEC US  GUIDE 12/03/2023 [facility]   BREAST BIOPSY Right 12/03/2023   US  RT BREAST BX W LOC DEV 1ST LESION IMG BX SPEC US  GUIDE 12/03/2023 [facility]   CARDIOVASCULAR STRESS TEST  04-20-2015  dr Rodolfo Clan   normal nuclear study/  no ischemia/  normal LV function and wall motion, ef 74%   COLON SURGERY  2003   COLONOSCOPY  01/19/2013   COLOSTOMY TAKEDOWN  07/22/2002   CYSTOSCOPY WITH RETROGRADE PYELOGRAM, URETEROSCOPY AND STENT PLACEMENT Left 07/28/2015   Procedure: CYSTOSCOPY WITH LEFT RETROGRADE PYELOGRAM,  POSSIBLE LEFT URETEROSCOPY AND STENT PLACEMENT;  Surgeon: Trent Frizzle, MD;  Location: [facility];  Service: Urology;  Laterality: Left;   EXPLORATORY LAPARTOMY SIGMOID  COLECTOMY/ COLOSTOMY  04/28/2002   perforated diverticulitis   KYPHOPLASTY     LAPAROSCOPY VENTRAL HERNIA REPAIR/  EXTENSIVE LYSIS ADHESIONS  05/04/2004   PACEMAKER GENERATOR CHANGE Left 09/16/2011   MDT ADDRL1 pacemaker   PACEMAKER PLACEMENT  05/11/2003   medtronic   ROBOT ASSISTED LAPAROSCOPIC NEPHRECTOMY Left 03/20/2016   Procedure: XI ROBOTIC ASSISTED LAPAROSCOPIC RETROPERITONEAL NEPHRECTOMY;  Surgeon: Osborn Blaze, MD;  Location: [facility];  Service: Urology;  Laterality: Left;   THYROIDECTOMY N/A 08/07/2012   Procedure: THYROIDECTOMY;  Surgeon: Keitha Pata, MD;  Location: [facility];  Service: General;  Laterality: N/A;   TRANSTHORACIC ECHOCARDIOGRAM  10/12/2014   moderate focal basal LVH,  ef 55-60%,  grade 2 diastolic dysfunction, mild AV calcification without stenosis,  mild MR,  trivial PR    FAMILY HISTORY: family history includes Diverticulosis in her sister; Heart disease in her brother and mother; Heart failure in her father; Hypertension in her brother, brother, and mother; Stroke in her mother; Varicose Veins in her mother.  SOCIAL HISTORY:  reports that she quit smoking about 44 years ago. Her smoking use included cigarettes. She started smoking about 59 years ago. She has never used smokeless tobacco. She reports that she does not drink alcohol and does not use drugs.  ALLERGIES: Tape, Codeine, and Latex  MEDICATIONS:  Current Outpatient Medications  Medication Sig Dispense Refill   acetaminophen  (TYLENOL ) 500 MG tablet Take 1,000 mg by mouth daily as needed for moderate pain.     amLODipine  (NORVASC ) 10 MG tablet Take 1 tablet by mouth once daily 15 tablet 0   apixaban  (ELIQUIS ) 5 MG TABS tablet Take 1 tablet (5 mg total) by mouth 2 (two) times daily. 180 tablet 1   azelastine  (ASTELIN ) 0.1 % nasal spray Place 1 spray into both nostrils 2 (two) times daily. Use in each nostril as directed 30 mL 12   Bacillus Coagulans-Inulin (PROBIOTIC-PREBIOTIC PO) Take 1 capsule by  mouth daily.     Cholecalciferol (VITAMIN D3 MAXIMUM STRENGTH) 125 MCG (5000 UT) capsule Take 5,000 Units by mouth daily.     denosumab  (PROLIA ) 60 MG/ML SOSY injection Inject 60 mg into the skin every 6 (six) months.     diclofenac  Sodium (VOLTAREN  ARTHRITIS PAIN) 1 % GEL Apply 4 g topically 4 (four) times daily. (Patient taking differently: Apply 4 g topically 4 (four) times daily as needed (pain).) 150 g 3   fluticasone  (FLONASE ) 50 MCG/ACT nasal spray Place 2 sprays into both nostrils daily as needed for allergies or rhinitis. 16 g 5   hydrochlorothiazide  (MICROZIDE ) 12.5 MG capsule Take 1 capsule by mouth once daily 90 capsule 0   levocetirizine (XYZAL ) 5 MG tablet Take 1 tablet (5 mg total) by mouth every evening. (Patient taking differently: Take 5 mg by mouth at bedtime as needed for allergies.) 90 tablet 1   levothyroxine  (SYNTHROID ) 100 MCG tablet TAKE 1 TABLET BY MOUTH ONCE DAILY . APPOINTMENT REQUIRED FOR FUTURE REFILLS 90 tablet 3   lidocaine  4 % Place 2 patches onto the skin daily as needed (pain).     lisinopril  (ZESTRIL ) 40 MG tablet Take 1 tablet (40 mg total) by mouth daily. 90 tablet 1   loratadine  (ALLERGY RELIEF, LORATADINE ,) 10 MG tablet Take 1 tablet (10 mg total) by mouth daily as needed for allergies or rhinitis. (Patient  taking differently: Take 10 mg by mouth daily.) 100 tablet 1   Menthol, Topical Analgesic, (STOPAIN ROLL-ON EX) Apply 1 Application topically daily as needed (pain).     Multiple Vitamins-Minerals (PRESERVISION AREDS 2) CAPS Take 2 capsules by mouth daily.     omeprazole  (PRILOSEC) 20 MG capsule Take 20 mg by mouth daily as needed (acid reflux). Take for 14 days at a time then stop until needed again     Polyethyl Glycol-Propyl Glycol (LUBRICATING EYE DROPS OP) Place 1 drop into both eyes daily as needed (dry eyes).     traMADol  (ULTRAM ) 50 MG tablet Take 1-2 tablets (50-100 mg total) by mouth every 6 (six) hours as needed for pain. 60 tablet 0   Current  Facility-Administered Medications  Medication Dose Route Frequency Provider Last Rate Last Admin   [START ON 05/28/2024] denosumab  (PROLIA ) injection 60 mg  60 mg Subcutaneous Once Gherghe, Cristina, MD       Facility-Administered Medications Ordered in Other Encounters  Medication Dose Route Frequency Provider Last Rate Last Admin   0.9 %  sodium chloride  infusion   Intravenous Continuous Tammie Fall, MD       acetaminophen  (TYLENOL ) tablet 325-650 mg  325-650 mg Oral Q4H PRN Tammie Fall, MD       aminophylline  injection 150 mg  150 mg Intravenous BID PRN Nelson, Katarina H, MD   150 mg at 04/20/15 1200   chlorhexidine  (HIBICLENS ) 4 % liquid 4 Application  4 Application Topical Once Tammie Fall, MD       fentaNYL  (SUBLIMAZE ) injection    PRN Tammie Fall, MD   12.5 mcg at 12/09/23 1610   fentaNYL  (SUBLIMAZE ) injection    PRN Tammie Fall, MD   25 mcg at 12/09/23 0908   lidocaine  (PF) (XYLOCAINE ) 1 % injection    PRN Tammie Fall, MD   50 mL at 12/09/23 0915   midazolam  (VERSED ) 5 MG/5ML injection    PRN Tammie Fall, MD   1 mg at 12/09/23 0904   midazolam  (VERSED ) 5 MG/5ML injection    PRN Tammie Fall, MD   1 mg at 12/09/23 0908   midazolam  (VERSED ) 5 MG/5ML injection    PRN Tammie Fall, MD   1 mg at 12/09/23 0913   ondansetron  (ZOFRAN ) injection 4 mg  4 mg Intravenous Q6H PRN Taylor, Gregg W, MD       povidone-iodine 10 % swab 2 Application  2 Application Topical Once Tammie Fall, MD        REVIEW OF SYSTEMS: As above in HPI.   PHYSICAL EXAM:  vitals were not taken for this visit.   General: Alert and oriented, in no acute distress HEENT: Head is normocephalic. Extraocular movements are intact.  Heart: Regular in rate and rhythm with no murmurs, rubs, or gallops. Chest: Clear to auscultation bilaterally, with no rhonchi, wheezes, or rales. Abdomen: Soft, nontender, nondistended, with no rigidity or guarding. Extremities: No cyanosis or edema. Skin:  No concerning lesions. Musculoskeletal: symmetric strength and muscle tone throughout. Neurologic: Cranial nerves II through XII are grossly intact. No obvious focalities. Speech is fluent. Coordination is intact. Psychiatric: Judgment and insight are intact. Affect is appropriate. Breasts: *** . No other palpable masses appreciated in the breasts or axillae *** .    ECOG = ***  0 - Asymptomatic (Fully active, able to carry on all predisease activities without restriction)  1 - Symptomatic but completely ambulatory (Restricted in physically  strenuous activity but ambulatory and able to carry out work of a light or sedentary nature. For example, light housework, office work)  2 - Symptomatic, <50% in bed during the day (Ambulatory and capable of all self care but unable to carry out any work activities. Up and about more than 50% of waking hours)  3 - Symptomatic, >50% in bed, but not bedbound (Capable of only limited self-care, confined to bed or chair 50% or more of waking hours)  4 - Bedbound (Completely disabled. Cannot carry on any self-care. Totally confined to bed or chair)  5 - Death   Aurea Blossom MM, Creech RH, Tormey DC, et al. [phone]). "Toxicity and response criteria of the [facility]". Am. Hillard Lowes. Oncol. 5 (6): 649-55   LABORATORY DATA:   CBC    Component Value Date/Time   WBC 6.9 12/08/2023 1149   WBC 5.4 01/06/2023 1037   RBC 4.94 12/08/2023 1149   RBC 4.67 01/06/2023 1037   HGB 14.2 12/08/2023 1149   HCT 43.6 12/08/2023 1149   PLT 163 12/08/2023 1149   MCV 88 12/08/2023 1149   MCH 28.7 12/08/2023 1149   MCH 27.5 06/06/2021 1013   MCHC 32.6 12/08/2023 1149   MCHC 32.1 01/06/2023 1037   RDW 13.2 12/08/2023 1149   LYMPHSABS 1.1 01/06/2023 1037   MONOABS 0.5 01/06/2023 1037   EOSABS 0.1 01/06/2023 1037   BASOSABS 0.0 01/06/2023 1037    CMP     Component Value Date/Time   NA 139 12/08/2023 1149   K 3.8 12/08/2023 1149   CL 99 12/08/2023  1149   CO2 17 (L) 12/08/2023 1149   GLUCOSE 95 12/08/2023 1149   GLUCOSE 90 01/06/2023 1037   BUN 17 12/08/2023 1149   CREATININE 1.06 (H) 12/08/2023 1149   CREATININE 1.08 (H) 01/18/2019 1107   CALCIUM  9.9 12/08/2023 1149   PROT 7.0 01/06/2023 1037   ALBUMIN 4.6 01/06/2023 1037   AST 24 01/06/2023 1037   ALT 16 01/06/2023 1037   ALKPHOS 61 01/06/2023 1037   BILITOT 0.5 01/06/2023 1037   GFR 50.55 (L) 01/06/2023 1037   EGFR 51 (L) 12/08/2023 1149   GFRNONAA 59 (L) 06/06/2021 1013   GFRNONAA 48 (L) 01/18/2019 1107      RADIOGRAPHY: EP PPM/ICD IMPLANT Result Date: 12/09/2023 Conclusion: Successful removal of her previously implanted dual-chamber pacemaker and insertion of a new dual-chamber pacemaker in a patient with complete heart block whose old device had reached elective replacement. Manya Sells, MD   CUP PACEART [facility] DEVICE CHECK Result Date: 12/08/2023 Device checked by [employer] > at [facility] as of 11/20/23, VVI 65.  Underlying > CHB with ventricular escape at 45 bpm.  Known RV lead programmed unipolar > Impedance 375 ohms, R wave > 22.4 mV.  Pending generator change on 12/09/23. 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 PATHOLOGY revealed: Site: Breast, right, needle core biopsy, 2:00 5cmfn ( coil clip) - INVASIVE DUCTAL CARCINOMA WITH MUCINOUS FEATURES. - TUBULE FORMATION: SCORE 3 - NUC LEAR PLEOMORPHISM: SCORE 2 - MITOTIC COUNT: SCORE 1 - TOTAL SCORE: 6 - OVERALL GRADE: 2 - LYMPHOVASCULAR INVASION: NOT IDENTIFIED - CANCER LENGTH: 9 MM - CALCIFICATIONS: PRESENT - DUCTAL  CARCINOMA IN SITU: PRESENT, INTERMEDIATE GRADE Pathology results are  CONCORDANT with imaging findings, per Sundra Engel M.D. PATHOLOGY revealed: Site: Breast, right, needle core biopsy, 2:00 10cmfn ( ribbon clip) - INVASIVE DUCTAL CARCINOMA WITH MUCINOUS FEATURES. - TUBULE FORMATION: SCORE 3 - NUC LEAR PLEOMORPHISM: SCORE 2 - MITOTIC COUNT: SCORE 1 - TOTAL SCORE: 6 - OVERALL GRADE: 2 - LYMPHOVASCULAR INVASION: NOT IDENTIFIED - CANCER LENGTH: 8 MM - CALCIFICATIONS: PRESENT - DUCTAL CARCINOMA IN SITU: NOT IDENTIFIED Pathology results are CONCORDANT with imaging findings, per Sundra Engel M.D. Pathology results and recommendations below were discussed with patient by telephone on 12/03/2023 by Ladonna Pickup RN. Patient reported biopsy sites within normal limits with slight tenderness at the sites. Post biopsy care instructions were reviewed, questions were answered and my direct phone number was provided to patient. Patient was instructed to call [facility] if any concerns or questions arise related to the biopsy. The patient was referred to [facility] on December 10, 2023. Pathology results reported by Kraig Peru, RN on 12/05/2023. Electronically Signed   By: Sundra Engel M.D.   On: 12/05/2023 14:19   Addendum Date: 12/03/2023 ADDENDUM REPORT: 12/03/2023 12:13 ADDENDUM: Please note that the 2 ultrasound-guided biopsies are performed of UPPER INNER RIGHT breast masses (instead of stated UPPER-OUTER RIGHT breast masses). The ultrasound-guided biopsy of the 1.5 cm 2 o'clock position mass 5 cm from the nipple is within the UPPER INNER RIGHT breast. The ultrasound-guided biopsy of the 1.7 cm 2 o'clock position mass 10 cm from the nipple is within the UPPER INNER RIGHT breast. The lesion quadrants of the biopsied areas are as follows: 2.2 cm group of RIGHT breast calcifications-UPPER-OUTER RIGHT breast. 1.5 cm 2 o'clock position RIGHT breast mass 5 cm from nipple-UPPER INNER RIGHT breast. 1.7 cm 2  o'clock position RIGHT breast mass 10 cm from nipple-UPPER INNER RIGHT breast. Electronically Signed   By: Sundra Engel M.D.   On: 12/03/2023 12:13   Result Date: 12/05/2023 CLINICAL DATA:  83 year old female presents for stereotactic guided biopsy of 0.8 cm group of UPPER OUTER RIGHT breast calcifications, ultrasound-guided biopsy of 1.7 cm UPPER-OUTER RIGHT breast mass and 1.5 cm UPPER-OUTER RIGHT breast mass. EXAM: RIGHT BREAST STEREOTACTIC CORE NEEDLE BIOPSY ULTRASOUND GUIDED RIGHT BREAST CORE NEEDLE BIOPSY X 2 COMPARISON:  Previous exam(s). FINDINGS: The patient and I discussed the procedure of stereotactic-guided and ultrasound-guided biopsy including benefits and alternatives. We discussed the high likelihood of successful procedures. We discussed the risks of the procedure including infection, bleeding, tissue injury, clip migration, and inadequate sampling. Informed written consent was given. The usual time out protocol was performed immediately prior to the procedures. RIGHT BREAST STEREOTACTIC CORE NEEDLE BIOPSY (0.8 cm group of UPPER OUTER RIGHT breast calcifications-X clip): Lesion quadrant: UPPER-OUTER RIGHT breast Using sterile technique and 1% Lidocaine  with and without epinephrine  as local anesthetic, under stereotactic guidance, a 9 gauge vacuum assisted device was used to perform core needle biopsy of the 0.8 cm group of UPPER-OUTER RIGHT breast calcifications using a SUPERIOR approach. Specimen radiograph was performed showing calcifications. Specimens with calcifications are identified for pathology. At the conclusion of the procedure, an X shaped tissue marker clip was deployed into the biopsy cavity. Follow-up 2-view mammogram was performed and dictated separately. ULTRASOUND GUIDED RIGHT BREAST CORE NEEDLE BIOPSY #1 (1.5 cm 2 o'clock position mass 5 cm from the nipple-COIL clip): Lesion quadrant: UPPER-OUTER RIGHT breast Using sterile technique and 1%  Lidocaine  with and without epinephrine  as  local anesthetic, under direct ultrasound visualization, a 14 gauge spring-loaded device was used to perform biopsy of the 1.5 cm 2 o'clock position mass 5 cm from the nipple using a SUPERIOR approach. At the conclusion of the procedure a COIL shaped tissue marker clip was deployed into the biopsy cavity. Follow up 2 view mammogram was performed and dictated separately. ULTRASOUND GUIDED RIGHT BREAST CORE NEEDLE BIOPSY #2 (1.7 cm 2 o'clock position mass 10 cm from the nipple-RIBBON clip): Lesion quadrant: UPPER-OUTER RIGHT breast Using sterile technique and 1% Lidocaine  with and without epinephrine  e as local anesthetic, under direct ultrasound visualization, a 14 gauge spring-loaded device was used to perform biopsy of the 1.7 cm 2 o'clock position mass 10 cm from the nipple using a LATERAL approach. At the conclusion of the procedure a RIBBON shaped tissue marker clip was deployed into the biopsy cavity. Follow up 2 view mammogram was performed and dictated separately. IMPRESSION: Stereotactic-guided biopsy of 0.8 cm group of UPPER-OUTER RIGHT breast calcifications(X clip). Ultrasound-guided biopsy of 1.5 cm 2 o'clock position RIGHT breast mass 5 cm from the nipple (COIL clip). Ultrasound guided biopsy of 1.7 cm 2 o'clock position RIGHT breast mass 10 cm from the nipple (RIBBON clip). No apparent complications. Electronically Signed: By: Sundra Engel M.D. 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 PATHOLOGY revealed: Site: Breast, right, needle core biopsy, 2:00 5cmfn ( coil clip) - INVASIVE DUCTAL CARCINOMA WITH MUCINOUS FEATURES. - TUBULE FORMATION:  SCORE 3 - NUC LEAR PLEOMORPHISM: SCORE 2 - MITOTIC COUNT: SCORE 1 - TOTAL SCORE: 6 - OVERALL GRADE: 2 - LYMPHOVASCULAR INVASION: NOT IDENTIFIED - CANCER LENGTH: 9 MM - CALCIFICATIONS: PRESENT - DUCTAL CARCINOMA IN SITU: PRESENT, INTERMEDIATE GRADE Pathology results are CONCORDANT with imaging findings, per Sundra Engel M.D. PATHOLOGY revealed: Site: Breast, right, needle core biopsy, 2:00 10cmfn ( ribbon clip) - INVASIVE DUCTAL CARCINOMA WITH MUCINOUS FEATURES. - TUBULE FORMATION: SCORE 3 - NUC LEAR PLEOMORPHISM: SCORE 2 - MITOTIC COUNT: SCORE 1 - TOTAL SCORE: 6 - OVERALL GRADE: 2 - LYMPHOVASCULAR INVASION: NOT IDENTIFIED - CANCER LENGTH: 8 MM - CALCIFICATIONS: PRESENT - DUCTAL CARCINOMA IN SITU: NOT IDENTIFIED Pathology results are CONCORDANT with imaging findings, per Sundra Engel M.D. Pathology results and recommendations below were discussed with patient by telephone on 12/03/2023 by Ladonna Pickup RN. Patient reported biopsy sites within normal limits with slight tenderness at the sites. Post biopsy care instructions were reviewed, questions were answered and my direct phone number was provided to patient. Patient was instructed to call [facility] if any concerns or questions arise related to the biopsy. The patient was referred to [facility] on December 10, 2023. Pathology results reported by Kraig Peru, RN on 12/05/2023. Electronically Signed   By: Sundra Engel M.D.   On: 12/05/2023 14:19   Addendum Date: 12/03/2023 ADDENDUM REPORT: 12/03/2023 12:13 ADDENDUM: Please note that the 2 ultrasound-guided biopsies are performed of UPPER INNER RIGHT breast masses (instead of stated UPPER-OUTER RIGHT breast masses). The ultrasound-guided biopsy of the 1.5 cm 2 o'clock position mass 5 cm from the nipple is  within the UPPER INNER RIGHT breast. The ultrasound-guided biopsy of the 1.7 cm 2 o'clock position mass 10 cm from the nipple  is within the UPPER INNER RIGHT breast. The lesion quadrants of the biopsied areas are as follows: 2.2 cm group of RIGHT breast calcifications-UPPER-OUTER RIGHT breast. 1.5 cm 2 o'clock position RIGHT breast mass 5 cm from nipple-UPPER INNER RIGHT breast. 1.7 cm 2 o'clock position RIGHT breast mass 10 cm from nipple-UPPER INNER RIGHT breast. Electronically Signed   By: Sundra Engel M.D.   On: 12/03/2023 12:13   Result Date: 12/05/2023 CLINICAL DATA:  83 year old female presents for stereotactic guided biopsy of 0.8 cm group of UPPER OUTER RIGHT breast calcifications, ultrasound-guided biopsy of 1.7 cm UPPER-OUTER RIGHT breast mass and 1.5 cm UPPER-OUTER RIGHT breast mass. EXAM: RIGHT BREAST STEREOTACTIC CORE NEEDLE BIOPSY ULTRASOUND GUIDED RIGHT BREAST CORE NEEDLE BIOPSY X 2 COMPARISON:  Previous exam(s). FINDINGS: The patient and I discussed the procedure of stereotactic-guided and ultrasound-guided biopsy including benefits and alternatives. We discussed the high likelihood of successful procedures. We discussed the risks of the procedure including infection, bleeding, tissue injury, clip migration, and inadequate sampling. Informed written consent was given. The usual time out protocol was performed immediately prior to the procedures. RIGHT BREAST STEREOTACTIC CORE NEEDLE BIOPSY (0.8 cm group of UPPER OUTER RIGHT breast calcifications-X clip): Lesion quadrant: UPPER-OUTER RIGHT breast Using sterile technique and 1% Lidocaine  with and without epinephrine  as local anesthetic, under stereotactic guidance, a 9 gauge vacuum assisted device was used to perform core needle biopsy of the 0.8 cm group of UPPER-OUTER RIGHT breast calcifications using a SUPERIOR approach. Specimen radiograph was performed showing calcifications. Specimens with calcifications are identified for pathology. At the conclusion of the procedure, an X shaped tissue marker clip was deployed into the biopsy cavity. Follow-up 2-view mammogram was  performed and dictated separately. ULTRASOUND GUIDED RIGHT BREAST CORE NEEDLE BIOPSY #1 (1.5 cm 2 o'clock position mass 5 cm from the nipple-COIL clip): Lesion quadrant: UPPER-OUTER RIGHT breast Using sterile technique and 1% Lidocaine  with and without epinephrine  as local anesthetic, under direct ultrasound visualization, a 14 gauge spring-loaded device was used to perform biopsy of the 1.5 cm 2 o'clock position mass 5 cm from the nipple using a SUPERIOR approach. At the conclusion of the procedure a COIL shaped tissue marker clip was deployed into the biopsy cavity. Follow up 2 view mammogram was performed and dictated separately. ULTRASOUND GUIDED RIGHT BREAST CORE NEEDLE BIOPSY #2 (1.7 cm 2 o'clock position mass 10 cm from the nipple-RIBBON clip): Lesion quadrant: UPPER-OUTER RIGHT breast Using sterile technique and 1% Lidocaine  with and without epinephrine  e as local anesthetic, under direct ultrasound visualization, a 14 gauge spring-loaded device was used to perform biopsy of the 1.7 cm 2 o'clock position mass 10 cm from the nipple using a LATERAL approach. At the conclusion of the procedure a RIBBON shaped tissue marker clip was deployed into the biopsy cavity. Follow up 2 view mammogram was performed and dictated separately. IMPRESSION: Stereotactic-guided biopsy of 0.8 cm group of UPPER-OUTER RIGHT breast calcifications(X clip). Ultrasound-guided biopsy of 1.5 cm 2 o'clock position RIGHT breast mass 5 cm from the nipple (COIL clip). Ultrasound guided biopsy of 1.7 cm 2 o'clock position RIGHT breast mass 10 cm from the nipple (RIBBON clip). No apparent complications. Electronically Signed: By: Sundra Engel M.D. 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 PATHOLOGY revealed: Site: Breast, right, needle core biopsy, 2:00 5cmfn ( coil clip) - INVASIVE DUCTAL CARCINOMA WITH MUCINOUS FEATURES. - TUBULE FORMATION: SCORE 3 - NUC LEAR PLEOMORPHISM: SCORE 2 - MITOTIC COUNT: SCORE 1 - TOTAL SCORE: 6 - OVERALL GRADE: 2 - LYMPHOVASCULAR INVASION: NOT IDENTIFIED - CANCER LENGTH: 9 MM - CALCIFICATIONS: PRESENT - DUCTAL CARCINOMA IN SITU: PRESENT, INTERMEDIATE GRADE Pathology results are CONCORDANT with imaging findings, per Sundra Engel M.D. PATHOLOGY revealed: Site: Breast, right, needle core biopsy, 2:00 10cmfn ( ribbon clip) - INVASIVE DUCTAL CARCINOMA WITH MUCINOUS FEATURES. - TUBULE FORMATION: SCORE 3 - NUC LEAR PLEOMORPHISM: SCORE 2 - MITOTIC COUNT: SCORE 1 - TOTAL SCORE: 6 - OVERALL GRADE: 2 - LYMPHOVASCULAR INVASION: NOT IDENTIFIED - CANCER LENGTH: 8 MM - CALCIFICATIONS: PRESENT - DUCTAL CARCINOMA IN SITU: NOT IDENTIFIED Pathology results are CONCORDANT with imaging findings, per Sundra Engel M.D. Pathology results and recommendations below were discussed with patient by telephone on 12/03/2023 by Ladonna Pickup RN. Patient reported biopsy sites within normal limits with slight tenderness at the sites. Post biopsy care instructions were reviewed, questions were answered and my direct phone number was provided to patient. Patient was instructed to call [facility] if any concerns or questions arise related to the biopsy. The patient was referred to [facility] at [facility] on December 10, 2023. Pathology results reported by Kraig Peru, RN on 12/05/2023. Electronically Signed   By: Sundra Engel M.D.   On: 12/05/2023 14:19   Addendum Date: 12/03/2023 ADDENDUM REPORT: 12/03/2023 12:13 ADDENDUM: Please note that the 2 ultrasound-guided biopsies are performed of UPPER INNER RIGHT breast masses  (instead of stated UPPER-OUTER RIGHT breast masses). The ultrasound-guided biopsy of the 1.5 cm 2 o'clock position mass 5 cm from the nipple is within the UPPER INNER RIGHT breast. The ultrasound-guided biopsy of the 1.7 cm 2 o'clock position mass 10 cm from the nipple is within the UPPER INNER RIGHT breast. The lesion quadrants of the biopsied areas are as follows: 2.2 cm group of RIGHT breast calcifications-UPPER-OUTER RIGHT breast. 1.5 cm 2 o'clock position RIGHT breast mass 5 cm from nipple-UPPER INNER RIGHT breast. 1.7 cm 2 o'clock position RIGHT breast mass 10 cm from nipple-UPPER INNER RIGHT breast. Electronically Signed   By: Sundra Engel M.D.   On: 12/03/2023 12:13   Result Date: 12/05/2023 CLINICAL DATA:  83 year old female presents for stereotactic guided biopsy of 0.8 cm group of UPPER OUTER RIGHT breast calcifications, ultrasound-guided biopsy of 1.7 cm UPPER-OUTER RIGHT breast mass and 1.5 cm UPPER-OUTER RIGHT breast mass. EXAM: RIGHT BREAST STEREOTACTIC CORE NEEDLE BIOPSY ULTRASOUND GUIDED RIGHT BREAST CORE NEEDLE BIOPSY X 2 COMPARISON:  Previous exam(s). FINDINGS: The patient and I discussed the procedure of stereotactic-guided and ultrasound-guided biopsy including benefits and alternatives. We discussed the high likelihood of successful procedures. We discussed the risks of the procedure including infection, bleeding, tissue injury, clip migration, and inadequate sampling. Informed written consent was given. The usual time out protocol was performed immediately prior to the procedures. RIGHT BREAST STEREOTACTIC CORE NEEDLE BIOPSY (0.8 cm group of UPPER OUTER RIGHT breast calcifications-X clip): Lesion quadrant: UPPER-OUTER RIGHT breast Using sterile technique and 1% Lidocaine  with and without epinephrine  as local anesthetic, under stereotactic guidance, a 9 gauge  vacuum assisted device was used to perform core needle biopsy of the 0.8 cm group of UPPER-OUTER RIGHT breast calcifications using a  SUPERIOR approach. Specimen radiograph was performed showing calcifications. Specimens with calcifications are identified for pathology. At the conclusion of the procedure, an X shaped tissue marker clip was deployed into the biopsy cavity. Follow-up 2-view mammogram was performed and dictated separately. ULTRASOUND GUIDED RIGHT BREAST CORE NEEDLE BIOPSY #1 (1.5 cm 2 o'clock position mass 5 cm from the nipple-COIL clip): Lesion quadrant: UPPER-OUTER RIGHT breast Using sterile technique and 1% Lidocaine  with and without epinephrine  as local anesthetic, under direct ultrasound visualization, a 14 gauge spring-loaded device was used to perform biopsy of the 1.5 cm 2 o'clock position mass 5 cm from the nipple using a SUPERIOR approach. At the conclusion of the procedure a COIL shaped tissue marker clip was deployed into the biopsy cavity. Follow up 2 view mammogram was performed and dictated separately. ULTRASOUND GUIDED RIGHT BREAST CORE NEEDLE BIOPSY #2 (1.7 cm 2 o'clock position mass 10 cm from the nipple-RIBBON clip): Lesion quadrant: UPPER-OUTER RIGHT breast Using sterile technique and 1% Lidocaine  with and without epinephrine  e as local anesthetic, under direct ultrasound visualization, a 14 gauge spring-loaded device was used to perform biopsy of the 1.7 cm 2 o'clock position mass 10 cm from the nipple using a LATERAL approach. At the conclusion of the procedure a RIBBON shaped tissue marker clip was deployed into the biopsy cavity. Follow up 2 view mammogram was performed and dictated separately. IMPRESSION: Stereotactic-guided biopsy of 0.8 cm group of UPPER-OUTER RIGHT breast calcifications(X clip). Ultrasound-guided biopsy of 1.5 cm 2 o'clock position RIGHT breast mass 5 cm from the nipple (COIL clip). Ultrasound guided biopsy of 1.7 cm 2 o'clock position RIGHT breast mass 10 cm from the nipple (RIBBON clip). No apparent complications. Electronically Signed: By: Sundra Engel M.D. On: 12/03/2023 12:01   MM  CLIP PLACEMENT RIGHT Result Date: 12/03/2023 CLINICAL DATA:  Evaluate placement of biopsy clips following stereotactic guided and ultrasound-guided RIGHT breast biopsies. EXAM: 3D DIAGNOSTIC RIGHT MAMMOGRAM POST BIOPSY COMPARISON:  Previous exam(s). ACR Breast Density Category b: There are scattered areas of fibroglandular density. FINDINGS: 3D Mammographic images were obtained following stereotactic guided biopsy of 0.8 cm group of UPPER-OUTER RIGHT breast calcifications (X clip), ultrasound-guided biopsy of 1.5 cm 2 o'clock position RIGHT breast mass 5 cm from the nipple (COIL clip) and ultrasound-guided biopsy of 1.7 cm 2 o'clock position RIGHT breast mass 10 cm from the nipple (RIBBON clip). The X biopsy marking clip is in expected position at the site of biopsy. The COIL biopsy marking clip is in expected position at the site of biopsy. The RIBBON biopsy marking clip is only visualized on the LATERAL view but is in expected position at the site of biopsy. IMPRESSION: Appropriate positioning of the X shaped biopsy marking clip at the site of biopsy in the UPPER OUTER RIGHT breast. Appropriate positioning of the COIL biopsy marking clip at the site of biopsy in the UPPER INNER RIGHT breast. Appropriate positioning of the RIBBON biopsy marking clip at the site of biopsy in the UPPER INNER RIGHT breast. Final Assessment: Post Procedure Mammograms for Marker Placement Electronically Signed   By: Sundra Engel M.D.   On: 12/03/2023 12:17   MM 3D DIAGNOSTIC MAMMOGRAM BILATERAL BREAST Result Date: 11/25/2023 CLINICAL DATA:  Screening recall for RIGHT breast masses and BILATERAL breast calcifications. EXAM: DIGITAL DIAGNOSTIC BILATERAL MAMMOGRAM WITH TOMOSYNTHESIS AND CAD; ULTRASOUND RIGHT BREAST LIMITED TECHNIQUE: Bilateral digital  diagnostic mammography and breast tomosynthesis was performed. The images were evaluated with computer-aided detection. ; Targeted ultrasound examination of the right breast was performed  COMPARISON:  Previous exam(s). ACR Breast Density Category b: There are scattered areas of fibroglandular density. FINDINGS: MAMMOGRAM: Right: Spot tomosynthesis views demonstrate 2 adjacent masses in the upper inner right breast as follows: Mass 1: 2.0 cm irregular mass in the upper inner quadrant posterior depth (spot CC image 18/36). Mass 2: 1.4 cm irregular mass in the upper inner quadrant middle depth (spot CC image 19/36). These masses correspond with the masses seen on screening mammogram and are new compared to prior examinations. Spot magnification views demonstrate an 8 mm group of coarse heterogeneous calcification in the upper slightly outer right breast middle depth. This is not definitely stable compared to prior examinations. The additional questioned calcifications in the right breast are mammographically stable since 2008, consistent with a benign etiology. Left: Spot magnification views demonstrate scattered and loosely grouped round and coarse heterogeneous calcification in the left breast, favored to be mammographically stable since at least 2016, consistent with a benign etiology. No new suspicious mass, calcification, or other findings are identified in the left breast. ULTRASOUND: Targeted right breast ultrasound was performed: At 2 o'clock 10 cm from the nipple, there is a round irregular hypoechoic mass. It measures 1.7 x 1.4 x 1.5 cm. This corresponds with "mass 1" seen on mammogram. At 2 o'clock 5 cm from the nipple, there is an oval irregular hypoechoic mass. It measures 1.5 x 0.9 x 1.2 cm. This corresponds with "mass 2" seen on mammogram. Targeted right axillary ultrasound demonstrates morphologically benign lymph nodes. No lymphadenopathy. IMPRESSION: 1. There are 2 suspicious masses in the RIGHT breast 2 o'clock position 5-10 cm from the nipple. Recommend further assessment with ultrasound-guided biopsy. 2. RIGHT breast 8 mm group of coarse heterogeneous calcification in the  upper-outer quadrant is indeterminate. Recommend further assessment with stereotactic guided biopsy. 3. No RIGHT axillary lymphadenopathy. 4. No evidence of malignancy in the LEFT breast. RECOMMENDATION: 1. RIGHT breast ultrasound-guided biopsy (2 site). 2. RIGHT breast stereotactic guided biopsy (1 site). I have discussed the findings and recommendations with the patient. The biopsy procedure was discussed with the patient and questions were answered. Patient expressed their understanding of the biopsy recommendation. Patient will be scheduled for biopsy at her earliest convenience by the schedulers. Ordering provider will be notified. If applicable, a reminder letter will be sent to the patient regarding the next appointment. BI-RADS CATEGORY  4: Suspicious. Electronically Signed   By: Sande Cromer M.D.   On: 11/25/2023 12:49   US  LIMITED ULTRASOUND INCLUDING AXILLA RIGHT BREAST Result Date: 11/25/2023 CLINICAL DATA:  Screening recall for RIGHT breast masses and BILATERAL breast calcifications. EXAM: DIGITAL DIAGNOSTIC BILATERAL MAMMOGRAM WITH TOMOSYNTHESIS AND CAD; ULTRASOUND RIGHT BREAST LIMITED TECHNIQUE: Bilateral digital diagnostic mammography and breast tomosynthesis was performed. The images were evaluated with computer-aided detection. ; Targeted ultrasound examination of the right breast was performed COMPARISON:  Previous exam(s). ACR Breast Density Category b: There are scattered areas of fibroglandular density. FINDINGS: MAMMOGRAM: Right: Spot tomosynthesis views demonstrate 2 adjacent masses in the upper inner right breast as follows: Mass 1: 2.0 cm irregular mass in the upper inner quadrant posterior depth (spot CC image 18/36). Mass 2: 1.4 cm irregular mass in the upper inner quadrant middle depth (spot CC image 19/36). These masses correspond with the masses seen on screening mammogram and are new compared to prior examinations. Spot magnification views demonstrate an  8 mm group of coarse  heterogeneous calcification in the upper slightly outer right breast middle depth. This is not definitely stable compared to prior examinations. The additional questioned calcifications in the right breast are mammographically stable since 2008, consistent with a benign etiology. Left: Spot magnification views demonstrate scattered and loosely grouped round and coarse heterogeneous calcification in the left breast, favored to be mammographically stable since at least 2016, consistent with a benign etiology. No new suspicious mass, calcification, or other findings are identified in the left breast. ULTRASOUND: Targeted right breast ultrasound was performed: At 2 o'clock 10 cm from the nipple, there is a round irregular hypoechoic mass. It measures 1.7 x 1.4 x 1.5 cm. This corresponds with "mass 1" seen on mammogram. At 2 o'clock 5 cm from the nipple, there is an oval irregular hypoechoic mass. It measures 1.5 x 0.9 x 1.2 cm. This corresponds with "mass 2" seen on mammogram. Targeted right axillary ultrasound demonstrates morphologically benign lymph nodes. No lymphadenopathy. IMPRESSION: 1. There are 2 suspicious masses in the RIGHT breast 2 o'clock position 5-10 cm from the nipple. Recommend further assessment with ultrasound-guided biopsy. 2. RIGHT breast 8 mm group of coarse heterogeneous calcification in the upper-outer quadrant is indeterminate. Recommend further assessment with stereotactic guided biopsy. 3. No RIGHT axillary lymphadenopathy. 4. No evidence of malignancy in the LEFT breast. RECOMMENDATION: 1. RIGHT breast ultrasound-guided biopsy (2 site). 2. RIGHT breast stereotactic guided biopsy (1 site). I have discussed the findings and recommendations with the patient. The biopsy procedure was discussed with the patient and questions were answered. Patient expressed their understanding of the biopsy recommendation. Patient will be scheduled for biopsy at her earliest convenience by the schedulers.  Ordering provider will be notified. If applicable, a reminder letter will be sent to the patient regarding the next appointment. BI-RADS CATEGORY  4: Suspicious. Electronically Signed   By: Sande Cromer M.D.   On: 11/25/2023 12:49   MM 3D SCREENING MAMMOGRAM BILATERAL BREAST Result Date: 11/13/2023 CLINICAL DATA:  Screening. EXAM: DIGITAL SCREENING BILATERAL MAMMOGRAM WITH TOMOSYNTHESIS AND CAD TECHNIQUE: Bilateral screening digital craniocaudal and mediolateral oblique mammograms were obtained. Bilateral screening digital breast tomosynthesis was performed. The images were evaluated with computer-aided detection. COMPARISON:  Previous exam(s). ACR Breast Density Category b: There are scattered areas of fibroglandular density. FINDINGS: In the right breast, possible masses and separate calcifications warrant further evaluation. In the left breast, possible calcifications warrant further evaluation. IMPRESSION: Further evaluation is suggested for possible masses and separate calcifications in the right breast. Further evaluation is suggested for possible calcifications in the left breast. RECOMMENDATION: Diagnostic mammogram and possibly ultrasound of both breasts. (Code:[id number]-[id number]) The patient will be contacted regarding the findings, and additional imaging will be scheduled. BI-RADS CATEGORY  0: Incomplete: Need additional imaging evaluation. Electronically Signed   By: Clancy Crimes M.D.   On: 11/13/2023 15:03      IMPRESSION/PLAN: ***   It was a pleasure meeting the patient today. We discussed the risks, benefits, and side effects of radiotherapy. I recommend radiotherapy to the *** to reduce her risk of locoregional recurrence by 2/3.  We discussed that radiation would take approximately *** weeks to complete and that I would give the patient a few weeks to heal following surgery before starting treatment planning. *** If chemotherapy were to be given, this would precede radiotherapy. We spoke  about acute effects including skin irritation and fatigue as well as much less common late effects including internal organ injury or irritation. We  spoke about the latest technology that is used to minimize the risk of late effects for patients undergoing radiotherapy to the breast or chest wall. No guarantees of treatment were given. The patient is enthusiastic about proceeding with treatment. I look forward to participating in the patient's care.  I will await her referral back to me for postoperative follow-up and eventual CT simulation/treatment planning.  On date of service, in total, I spent *** minutes on this encounter. Patient was seen in person.   __________________________________________   Colie Dawes, MD  This document serves as a record of services personally performed by Colie Dawes, MD. It was created on her behalf by Aleta Anda, a trained medical scribe. The creation of this record is based on the scribe's personal observations and the provider's statements to them. This document has been checked and approved by the attending provider.

## 2023-12-10 ENCOUNTER — Ambulatory Visit
Admission: RE | Admit: 2023-12-10 | Discharge: 2023-12-10 | Disposition: A | Discharge status: Home / Self Care | Source: Ambulatory Visit | Attending: Radiation Oncology | Admitting: Radiation Oncology

## 2023-12-10 ENCOUNTER — Encounter: Payer: Self-pay | Admitting: Hematology and Oncology

## 2023-12-10 ENCOUNTER — Ambulatory Visit: Payer: Self-pay | Admitting: Surgery

## 2023-12-10 ENCOUNTER — Encounter: Payer: Self-pay | Admitting: *Deleted

## 2023-12-10 ENCOUNTER — Inpatient Hospital Stay (HOSPITAL_BASED_OUTPATIENT_CLINIC_OR_DEPARTMENT_OTHER): Admitting: Hematology and Oncology

## 2023-12-10 ENCOUNTER — Inpatient Hospital Stay: Attending: Hematology and Oncology

## 2023-12-10 ENCOUNTER — Telehealth: Payer: Self-pay | Admitting: Cardiovascular Disease

## 2023-12-10 ENCOUNTER — Telehealth: Payer: Self-pay | Admitting: Genetic Counselor

## 2023-12-10 VITALS — BP 150/65 | HR 97 | Temp 98.3°F | Resp 16 | Ht 63.0 in | Wt 137.7 lb

## 2023-12-10 DIAGNOSIS — Z9049 Acquired absence of other specified parts of digestive tract: Secondary | ICD-10-CM | POA: Insufficient documentation

## 2023-12-10 DIAGNOSIS — Z9089 Acquired absence of other organs: Secondary | ICD-10-CM

## 2023-12-10 DIAGNOSIS — N6312 Unspecified lump in the right breast, upper inner quadrant: Secondary | ICD-10-CM | POA: Diagnosis not present

## 2023-12-10 DIAGNOSIS — Z823 Family history of stroke: Secondary | ICD-10-CM | POA: Diagnosis not present

## 2023-12-10 DIAGNOSIS — Z79899 Other long term (current) drug therapy: Secondary | ICD-10-CM | POA: Diagnosis not present

## 2023-12-10 DIAGNOSIS — Z1732 Human epidermal growth factor receptor 2 negative status: Secondary | ICD-10-CM

## 2023-12-10 DIAGNOSIS — Z8379 Family history of other diseases of the digestive system: Secondary | ICD-10-CM

## 2023-12-10 DIAGNOSIS — I10 Essential (primary) hypertension: Secondary | ICD-10-CM | POA: Diagnosis not present

## 2023-12-10 DIAGNOSIS — I480 Paroxysmal atrial fibrillation: Secondary | ICD-10-CM | POA: Insufficient documentation

## 2023-12-10 DIAGNOSIS — Z7901 Long term (current) use of anticoagulants: Secondary | ICD-10-CM | POA: Diagnosis not present

## 2023-12-10 DIAGNOSIS — Z87891 Personal history of nicotine dependence: Secondary | ICD-10-CM

## 2023-12-10 DIAGNOSIS — Z1721 Progesterone receptor positive status: Secondary | ICD-10-CM | POA: Insufficient documentation

## 2023-12-10 DIAGNOSIS — Z87442 Personal history of urinary calculi: Secondary | ICD-10-CM | POA: Diagnosis not present

## 2023-12-10 DIAGNOSIS — Z170 Estrogen receptor positive status [ER+]: Secondary | ICD-10-CM | POA: Diagnosis not present

## 2023-12-10 DIAGNOSIS — Z885 Allergy status to narcotic agent status: Secondary | ICD-10-CM | POA: Diagnosis not present

## 2023-12-10 DIAGNOSIS — Z8249 Family history of ischemic heart disease and other diseases of the circulatory system: Secondary | ICD-10-CM | POA: Diagnosis not present

## 2023-12-10 DIAGNOSIS — C50911 Malignant neoplasm of unspecified site of right female breast: Secondary | ICD-10-CM

## 2023-12-10 DIAGNOSIS — C50811 Malignant neoplasm of overlapping sites of right female breast: Secondary | ICD-10-CM | POA: Diagnosis present

## 2023-12-10 DIAGNOSIS — E039 Hypothyroidism, unspecified: Secondary | ICD-10-CM

## 2023-12-10 LAB — CBC WITH DIFFERENTIAL (CANCER CENTER ONLY)
Abs Immature Granulocytes: 0.02 10*3/uL (ref 0.00–0.07)
Basophils Absolute: 0 10*3/uL (ref 0.0–0.1)
Basophils Relative: 0 %
Eosinophils Absolute: 0 10*3/uL (ref 0.0–0.5)
Eosinophils Relative: 1 %
HCT: 44.7 % (ref 36.0–46.0)
Hemoglobin: 15 g/dL (ref 12.0–15.0)
Immature Granulocytes: 0 %
Lymphocytes Relative: 16 %
Lymphs Abs: 1.2 10*3/uL (ref 0.7–4.0)
MCH: 28 pg (ref 26.0–34.0)
MCHC: 33.6 g/dL (ref 30.0–36.0)
MCV: 83.4 fL (ref 80.0–100.0)
Monocytes Absolute: 0.5 10*3/uL (ref 0.1–1.0)
Monocytes Relative: 7 %
Neutro Abs: 5.5 10*3/uL (ref 1.7–7.7)
Neutrophils Relative %: 76 %
Platelet Count: 188 10*3/uL (ref 150–400)
RBC: 5.36 MIL/uL — ABNORMAL HIGH (ref 3.87–5.11)
RDW: 13.1 % (ref 11.5–15.5)
WBC Count: 7.3 10*3/uL (ref 4.0–10.5)
nRBC: 0 % (ref 0.0–0.2)

## 2023-12-10 LAB — CMP (CANCER CENTER ONLY)
ALT: 15 U/L (ref 0–44)
AST: 28 U/L (ref 15–41)
Albumin: 5.4 g/dL — ABNORMAL HIGH (ref 3.5–5.0)
Alkaline Phosphatase: 57 U/L (ref 38–126)
Anion gap: 11 (ref 5–15)
BUN: 12 mg/dL (ref 8–23)
CO2: 28 mmol/L (ref 22–32)
Calcium: 10.6 mg/dL — ABNORMAL HIGH (ref 8.9–10.3)
Chloride: 101 mmol/L (ref 98–111)
Creatinine: 0.94 mg/dL (ref 0.44–1.00)
GFR, Estimated: 59 mL/min — ABNORMAL LOW (ref >60)
Glucose, Bld: 91 mg/dL (ref 70–99)
Potassium: 3.3 mmol/L — ABNORMAL LOW (ref 3.5–5.1)
Sodium: 140 mmol/L (ref 135–145)
Total Bilirubin: 0.9 mg/dL (ref 0.0–1.2)
Total Protein: 8.4 g/dL — ABNORMAL HIGH (ref 6.5–8.1)

## 2023-12-10 NOTE — Research (Signed)
 [employer] 2021-05 - Specimen Collection Study to Evaluate Biomarkers in Subjects with Cancer   Patient [name] was identified by Dr. Gudena as a potential candidate for the above listed study.  This Clinical Research Nurse met with GIZELLE WHETSEL, [id number], on 12/10/23 in a manner and location that ensures patient privacy to discuss participation in the above listed research study.  Patient is Unaccompanied.  A copy of the informed consent document with embedded HIPAA language was provided to the patient.  Patient reads, speaks, and understands [place].   Patient was provided with the business card of this Nurse and encouraged to contact the research team with any questions.  Approximately 15 minutes were spent with the patient reviewing the informed consent documents.  Patient was provided the option of taking informed consent documents home to review and was encouraged to review at their convenience with their support network, including other care providers. Patient is comfortable with making a decision regarding study participation today.  Research RN explained details of informed consent and research study to patient. Pt did wish to partcipate in this study due to being a difficult stick. She did not wish to be stuck for this study.  Pt was thanked for her time.  Ramonia Burns BSN RN Clinical Research Nurse [facility] Direct Dial: [phone] 12/10/2023  1:30 PM

## 2023-12-10 NOTE — Telephone Encounter (Signed)
 Pt had procedure yesterday and the tape is causing blisters. She wants to know if she can take it off because it is painful

## 2023-12-10 NOTE — Progress Notes (Signed)
 [facility] CONSULT NOTE  Patient Care Team: Crecencio Dodge, Candida Chalk, DO as PCP - General (Family Medicine) Verona Goodwill, MD as PCP - Electrophysiology (Cardiology) Secundino Dach Harvey Linen., MD as Consulting Physician (Urology) Verona Goodwill, MD as Consulting Physician (Cardiology) Lanita Pitman, MD as Consulting Physician (Gastroenterology) Alane Hsu, RN as Oncology Nurse Navigator Auther Bo, RN as Oncology Nurse Navigator Sim Dryer, MD as Consulting Physician (General Surgery) Cameron Cea, MD as Consulting Physician (Hematology and Oncology) Colie Dawes, MD as Attending Physician (Radiation Oncology)  CHIEF COMPLAINTS/PURPOSE OF CONSULTATION:  Newly diagnosed breast cancer  HISTORY OF PRESENTING ILLNESS:  History of Present Illness [name] is an 83 year old female with invasive ductal carcinoma of the right breast who presents for a follow-up consultation regarding her treatment plan.  A routine mammogram revealed two masses in the right breast, measuring 1.5 cm and 1.7 cm, diagnosed as invasive ductal carcinoma. An additional area of atypical ductal hyperplasia measures 0.8 cm. There is no lymph node enlargement.  Hormone receptor testing shows 2% estrogen receptor positivity and 95% progesterone receptor positivity. HER2 is negative, and the KI-67 proliferation index is 5%. The cancer is classified as grade 2 and stage 1A.  She recently had a generator pacemaker replaced and is currently wearing a compression bandage on her chest.     I reviewed her records extensively and collaborated the history with the patient.  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 08/2011 Upgrade of PPM to MDT Adapta L Dual Chamber PPM ser # [id number] H.   Breast cancer [facility])    Cardiac pacemaker in situ    Contact dermatitis and eczema    unspec cause   Dairy product intolerance    Depression    Diastolic dysfunction    GERD (gastroesophageal reflux disease)    Hiatal hernia    History of diverticulitis of colon    2003  perforated diverticulitis w/ surgical intervention   History of kidney stones    age 77   History of peripheral edema    lower extremities   History of thyroid  nodule    multinodular goiter  s/p  total thyroidectomy 2014   HTN (hypertension)    Hydronephrosis, left    Hypothyroidism    Mild sleep apnea    per study 04-14-2014   Nocturia    NSVT (nonsustained ventricular tachycardia) (HCC)    Osteoporosis    PAF (paroxysmal atrial fibrillation) (HCC)    PONV (postoperative nausea and vomiting)    Pulmonary nodules    stable per last ct   Renal cell carcinoma of left kidney (HCC)    Superficial thrombophlebitis     SURGICAL HISTORY: Past Surgical History:  Procedure Laterality Date   BREAST BIOPSY Right 12/03/2023   MM RT BREAST BX W LOC DEV 1ST LESION IMAGE BX SPEC STEREO GUIDE 12/03/2023 [facility]   BREAST BIOPSY Right 12/03/2023   US  RT BREAST BX W LOC DEV EA ADD LESION IMG BX SPEC US  GUIDE 12/03/2023 [facility]   BREAST BIOPSY Right 12/03/2023   US  RT BREAST BX W LOC DEV 1ST LESION IMG BX SPEC US  GUIDE 12/03/2023 [facility]   CARDIOVASCULAR  STRESS TEST  04-20-2015  dr Rodolfo Clan   normal nuclear study/  no ischemia/  normal LV function and wall motion, ef 74%   COLON SURGERY  01-07-2002   COLONOSCOPY  01/19/2013   COLOSTOMY TAKEDOWN  07/22/2002   CYSTOSCOPY WITH RETROGRADE PYELOGRAM, URETEROSCOPY AND STENT PLACEMENT Left 07/28/2015   Procedure: CYSTOSCOPY WITH LEFT RETROGRADE PYELOGRAM, POSSIBLE LEFT URETEROSCOPY AND STENT PLACEMENT;  Surgeon: Trent Frizzle, MD;  Location: [facility];  Service: Urology;  Laterality: Left;   EXPLORATORY LAPARTOMY SIGMOID COLECTOMY/ COLOSTOMY  04/28/2002   perforated diverticulitis   KYPHOPLASTY     LAPAROSCOPY VENTRAL HERNIA REPAIR/  EXTENSIVE LYSIS ADHESIONS  05/04/2004   PACEMAKER GENERATOR CHANGE Left 09/16/2011   MDT ADDRL1 pacemaker   PACEMAKER PLACEMENT  05/11/2003   medtronic   PPM GENERATOR CHANGEOUT N/A 12/09/2023   Procedure: PPM GENERATOR CHANGEOUT;  Surgeon: Tammie Fall, MD;  Location: [facility];  Service: Cardiovascular;  Laterality: N/A;   ROBOT ASSISTED LAPAROSCOPIC NEPHRECTOMY Left 03/20/2016   Procedure: XI ROBOTIC ASSISTED LAPAROSCOPIC RETROPERITONEAL NEPHRECTOMY;  Surgeon: Osborn Blaze, MD;  Location: [facility];  Service: Urology;  Laterality: Left;   THYROIDECTOMY N/A 08/07/2012   Procedure: THYROIDECTOMY;  Surgeon: Keitha Pata, MD;  Location: [facility];  Service: General;  Laterality: N/A;   TRANSTHORACIC ECHOCARDIOGRAM  10/12/2014   moderate focal basal LVH,  ef 55-60%,  grade 2 diastolic dysfunction, mild AV calcification without stenosis,  mild MR,  trivial PR    SOCIAL HISTORY: Social History   Socioeconomic History   Marital status: Single    Spouse name: Not on file   Number of children: 2   Years of education: 16   Highest education level: Not on file  Occupational History   Occupation: Retired Nurse, adult for the [employer]    Employer: RETIRED  Tobacco Use   Smoking status: Former    Current packs/day: 0.00    Types:  Cigarettes    Start date: 10/05/1964    Quit date: 10/06/1979    Years since quitting: 44.2   Smokeless tobacco: Never  Vaping Use   Vaping status: Never Used  Substance and Sexual Activity   Alcohol use: No   Drug use: No   Sexual activity: Not Currently    Birth control/protection: None  Other Topics Concern   Not on file  Social History Narrative   The patient is a Buyer, retail of [employer].  She worked for the [employer] in [employer].  She was married for 22 years, divorced, and has remained single. She has 1 grown son, 1 grown daughter.  She has 3 grandchildren, 2 of her children   live in [place].  She is retired.  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 She had a long term companion, 32 years, who passed away 01/08/2011.   Social Drivers of Corporate investment banker Strain: Low Risk  (07/28/2023)   Overall Physicist, medical  Strain (CARDIA)    Difficulty of Paying Living Expenses: Not very hard  Food Insecurity: No Food Insecurity (07/28/2023)   Hunger Vital Sign    Worried About Running Out of Food in the Last Year: Never true    Ran Out of Food in the Last Year: Never true  Transportation Needs: No Transportation Needs (07/28/2023)   PRAPARE - Administrator, Civil Service (Medical): No    Lack of Transportation (Non-Medical): No  Physical Activity: Insufficiently Active (07/28/2023)   Exercise Vital Sign    Days of Exercise per Week: 2 days    Minutes of Exercise per Session: 20 min  Stress: No Stress Concern Present (07/28/2023)   [employer] of Occupational Health - Occupational Stress Questionnaire    Feeling of Stress : Not at all  Social Connections: Unknown (07/28/2023)   Social Connection and Isolation Panel [NHANES]    Frequency of Communication with Friends and Family: More than three times a week    Frequency of Social Gatherings with Friends and Family: Once a week    Attends Religious Services:  Patient declined    Database administrator or Organizations: No    Attends Banker Meetings: Never    Marital Status: Divorced  Catering manager Violence: Not At Risk (03/21/2021)   Humiliation, Afraid, Rape, and Kick questionnaire    Fear of Current or Ex-Partner: No    Emotionally Abused: No    Physically Abused: No    Sexually Abused: No    FAMILY HISTORY: Family History  Problem Relation Age of Onset   Heart disease Mother        Stent in her 30s   Stroke Mother    Hypertension Mother    Varicose Veins Mother    Heart failure Father    Diverticulosis Sister    Hypertension Brother    Heart disease Brother    Hypertension Brother    Heart attack Neg Hx     ALLERGIES:  is allergic to tape, codeine, and latex.  MEDICATIONS:  Current Outpatient Medications  Medication Sig Dispense Refill   acetaminophen  (TYLENOL ) 500 MG tablet Take 1,000 mg by mouth daily as needed for moderate pain.     amLODipine  (NORVASC ) 10 MG tablet Take 1 tablet by mouth once daily 15 tablet 0   apixaban  (ELIQUIS ) 5 MG TABS tablet Take 1 tablet (5 mg total) by mouth 2 (two) times daily. 180 tablet 1   azelastine  (ASTELIN ) 0.1 % nasal spray Place 1 spray into both nostrils 2 (two) times daily. Use in each nostril as directed 30 mL 12   Bacillus Coagulans-Inulin (PROBIOTIC-PREBIOTIC PO) Take 1 capsule by mouth daily.     Cholecalciferol (VITAMIN D3 MAXIMUM STRENGTH) 125 MCG (5000 UT) capsule Take 5,000 Units by mouth daily.     denosumab  (PROLIA ) 60 MG/ML SOSY injection Inject 60 mg into the skin every 6 (six) months.     diclofenac  Sodium (VOLTAREN  ARTHRITIS PAIN) 1 % GEL Apply 4 g topically 4 (four) times daily. (Patient taking differently: Apply 4 g topically 4 (four) times daily as needed (pain).) 150 g 3   fluticasone  (FLONASE ) 50 MCG/ACT nasal spray Place 2 sprays into both nostrils daily as needed for allergies or rhinitis. 16 g 5   hydrochlorothiazide  (MICROZIDE ) 12.5 MG capsule Take  1 capsule by mouth once daily 90 capsule 0   levocetirizine (XYZAL ) 5 MG tablet Take 1 tablet (5 mg total) by mouth every evening. (Patient taking differently:  Take 5 mg by mouth at bedtime as needed for allergies.) 90 tablet 1   levothyroxine  (SYNTHROID ) 100 MCG tablet TAKE 1 TABLET BY MOUTH ONCE DAILY . APPOINTMENT REQUIRED FOR FUTURE REFILLS 90 tablet 3   lidocaine  4 % Place 2 patches onto the skin daily as needed (pain).     lisinopril  (ZESTRIL ) 40 MG tablet Take 1 tablet (40 mg total) by mouth daily. 90 tablet 1   loratadine  (ALLERGY RELIEF, LORATADINE ,) 10 MG tablet Take 1 tablet (10 mg total) by mouth daily as needed for allergies or rhinitis. (Patient taking differently: Take 10 mg by mouth daily.) 100 tablet 1   Menthol, Topical Analgesic, (STOPAIN ROLL-ON EX) Apply 1 Application topically daily as needed (pain).     Multiple Vitamins-Minerals (PRESERVISION AREDS 2) CAPS Take 2 capsules by mouth daily.     omeprazole  (PRILOSEC) 20 MG capsule Take 20 mg by mouth daily as needed (acid reflux). Take for 14 days at a time then stop until needed again     Polyethyl Glycol-Propyl Glycol (LUBRICATING EYE DROPS OP) Place 1 drop into both eyes daily as needed (dry eyes).     traMADol  (ULTRAM ) 50 MG tablet Take 1-2 tablets (50-100 mg total) by mouth every 6 (six) hours as needed for pain. 60 tablet 0   Current Facility-Administered Medications  Medication Dose Route Frequency Provider Last Rate Last Admin   [START ON 05/28/2024] denosumab  (PROLIA ) injection 60 mg  60 mg Subcutaneous Once Gherghe, Cristina, MD       Facility-Administered Medications Ordered in Other Visits  Medication Dose Route Frequency Provider Last Rate Last Admin   aminophylline  injection 150 mg  150 mg Intravenous BID PRN Nelson, Katarina H, MD   150 mg at 04/20/15 1200    REVIEW OF SYSTEMS:   Constitutional: Denies fevers, chills or abnormal night sweats Breast:  Denies any palpable lumps or discharge All other systems  were reviewed with the patient and are negative.  PHYSICAL EXAMINATION: ECOG PERFORMANCE STATUS: 0 - Asymptomatic  Vitals:   12/10/23 0923 12/10/23 0924  BP: (!) 149/67 (!) 150/65  Pulse: 97   Resp: 16   Temp: 98.3 F (36.8 C)   SpO2: 100%    Filed Weights   12/10/23 0923  Weight: 137 lb 11.2 oz (62.5 kg)    GENERAL:alert, no distress and comfortable    LABORATORY DATA:  I have reviewed the data as listed Lab Results  Component Value Date   WBC 6.9 12/08/2023   HGB 14.2 12/08/2023   HCT 43.6 12/08/2023   MCV 88 12/08/2023   PLT 163 12/08/2023   Lab Results  Component Value Date   NA 139 12/08/2023   K 3.8 12/08/2023   CL 99 12/08/2023   CO2 17 (L) 12/08/2023    RADIOGRAPHIC STUDIES: I have personally reviewed the radiological reports and agreed with the findings in the report.  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 After reviewing the pathology in detail, we proceeded to discuss the different  treatment options between surgery, radiation, chemotherapy, antiestrogen therapies.  Treatment plan: Breast conserving surgery Adjuvant radiation (if final path is ER Neg) Repeat breast prognostic panel on the final pathology and determine if she would benefit from antiestrogen therapy  Return to clinic after surgery to discuss final pathology     All questions were answered. The patient knows to call the clinic  with any problems, questions or concerns.    Viinay K Kayden Amend, MD 12/10/23

## 2023-12-10 NOTE — Telephone Encounter (Signed)
 Patient has pressure dressing in place over Telfa dressing. Patient reports the clear tegaderm dressing is causing her skin to blister which is under the pressure dressing. Patient states Dr. Carolynne Citron advised pt to remove pressure dressing on 12/11/23.  Spoke to Dr. Arlester Ladd in office who advised patient to go ahead and remove pressure dressing/tegaderm dressing considering skin break down is occurring. Patient advised of Dr. Katheryne Pane recommendations. Advised patient to call if she observes swelling such as golf ball in size or larger. Advised pt there are steri-strips below tegaderm and those should remain in place and dry, we will remove at wound check apt. Patient will have daughter to come help take dressing off considering patients arm limitations at this time. Patient voiced understanding and agreeable to plan.

## 2023-12-10 NOTE — Telephone Encounter (Signed)
 [name] was seen by a genetic counselor during the breast multidisciplinary clinic on 12/10/2023. In addition to her personal history of breast cancer, she reported a family history of breast cancer in one maternal aunt diagnosed in her 29s. She does not meet NCCN criteria for genetic testing at this time. She was still offered genetic counseling and testing but declined. We encourage her to contact us  if there are any changes to her personal or family history of cancer. If she meets NCCN criteria based on the updated personal/family history, she would be recommended to have genetic counseling and testing.   Daisy Cunanan, MS, [facility] Genetic Counselor [email or web address] (P) [phone]

## 2023-12-10 NOTE — Assessment & Plan Note (Signed)
 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 After reviewing the pathology in detail, we proceeded to discuss the different treatment options between surgery, radiation, chemotherapy, antiestrogen therapies.  Treatment plan: Breast conserving surgery Adjuvant radiation Repeat breast prognostic panel on the final pathology and determine if she would benefit from antiestrogen therapy  Return to clinic after surgery to discuss final pathology

## 2023-12-11 ENCOUNTER — Encounter

## 2023-12-11 ENCOUNTER — Encounter: Payer: Self-pay | Admitting: Internal Medicine

## 2023-12-11 ENCOUNTER — Other Ambulatory Visit

## 2023-12-12 ENCOUNTER — Encounter: Payer: Self-pay | Admitting: Emergency Medicine

## 2023-12-12 ENCOUNTER — Telehealth: Payer: Self-pay | Admitting: *Deleted

## 2023-12-12 ENCOUNTER — Encounter: Payer: Self-pay | Admitting: *Deleted

## 2023-12-12 NOTE — Telephone Encounter (Signed)
 Left vm regarding [facility] from 12/10/23. Contact information provided for questions or needs.

## 2023-12-15 ENCOUNTER — Other Ambulatory Visit: Payer: Self-pay | Admitting: Surgery

## 2023-12-15 ENCOUNTER — Encounter: Payer: Self-pay | Admitting: *Deleted

## 2023-12-15 DIAGNOSIS — C50911 Malignant neoplasm of unspecified site of right female breast: Secondary | ICD-10-CM

## 2023-12-15 NOTE — Telephone Encounter (Signed)
**Note De-identified  [place] Obfuscation** Please advise 

## 2023-12-15 NOTE — Progress Notes (Signed)
 Remote pacemaker transmission.

## 2023-12-15 NOTE — Addendum Note (Signed)
 Addended by: Lott Rouleau A on: 12/15/2023 10:55 AM   Modules accepted: Orders

## 2023-12-23 ENCOUNTER — Ambulatory Visit: Attending: Cardiovascular Disease

## 2023-12-23 DIAGNOSIS — I442 Atrioventricular block, complete: Secondary | ICD-10-CM | POA: Insufficient documentation

## 2023-12-23 NOTE — Patient Instructions (Addendum)
   After Your Pacemaker   Monitor your pacemaker site for redness, swelling, and drainage. Call the device clinic at [phone] if you experience these symptoms or fever/chills.  Your incision was closed with Steri-strips or staples:  You may shower 7 days after your procedure and wash your incision with soap and water. Avoid lotions, ointments, or perfumes over your incision until it is well-healed.  You may use a hot tub or a pool after your wound check appointment if the incision is completely closed.  There are no restrictions in arm movement after your wound check appointment.  You may drive, unless driving has been restricted by your healthcare providers.  Remote monitoring is used to monitor your pacemaker from home. This monitoring is scheduled every 91 days by our office. It allows [place] to keep an eye on the functioning of your device to ensure it is working properly. You will routinely see your Electrophysiologist annually (more often if necessary).

## 2023-12-23 NOTE — Progress Notes (Signed)
 Normal dual chamber pacemaker wound check. Presenting rhythm: AS/VP 83 . Wound well healed. Routine testing performed. Thresholds, sensing, and impedances consistent with pre generator change measurements. Reprogrammed atrial output from adaptive to monitor 2.5V @ 0.61ms per Dr. Waddell preference. No episodes.  Pt enrolled in remote follow-up.

## 2023-12-24 ENCOUNTER — Telehealth: Payer: Self-pay

## 2023-12-24 NOTE — Telephone Encounter (Signed)
 Patient was recently seen by [name] prior to her generator change out procedure. She has upcoming lumpectomy procedure. Brandi, can you comment on her cardiac clearance? Does she need another visit prior to clearance?

## 2023-12-24 NOTE — Telephone Encounter (Signed)
   Pre-operative Risk Assessment    Patient Name: [name]  DOB: 1940/08/19 MRN: [id number]   Date of last office visit: 12/08/23 Date of next office visit: 03/10/24   Request for Surgical Clearance    Procedure:  lumpectomy   Date of Surgery:  Clearance 01/06/24                                 Surgeon:  Debby Shipper, MD  Surgeon's Group or Practice Name:  [facility]  Phone number:  [phone] Fax number:  [phone]   Type of Clearance Requested:   - Medical  - Pharmacy:  Hold Apixaban  (Eliquis ) Not indicated    Type of Anesthesia:  General    Additional requests/questions:    [name]   12/24/2023, 2:18 PM

## 2023-12-24 NOTE — Telephone Encounter (Signed)
Clinical pharmacist to review Eliquis 

## 2023-12-25 ENCOUNTER — Encounter: Payer: Self-pay | Admitting: Cardiology

## 2023-12-25 NOTE — Progress Notes (Unsigned)
 PERIOPERATIVE PRESCRIPTION FOR IMPLANTED CARDIAC DEVICE PROGRAMMING  Patient Information: Name:  [name]  DOB:  08/04/1940  MRN:  [id number]  Procedure:  lumpectomy    Date of Surgery:  Clearance 01/06/24                                  Surgeon:  Debby Shipper, MD  Surgeon's Group or Practice Name:  [facility]  Phone number:  [phone] Fax number:  [phone]   Type of Clearance Requested:   - Medical  - Pharmacy:  Hold Apixaban  (Eliquis ) Not indicated    Type of Anesthesia:  General Device Information:  Clinic EP Physician:  Soyla Norton, MD   Device Type:  Pacemaker Manufacturer and Phone #:  Medtronic: [phone] Pacemaker Dependent?:  Yes.   Date of Last Device Check:  12/23/23 Normal Device Function?:  Yes.    Electrophysiologist's Recommendations:  Have magnet available. Provide continuous ECG monitoring when magnet is used or reprogramming is to be performed.  Procedure may interfere with device function.  Magnet should be placed over device during procedure.  Per Device Clinic Standing Orders, Daisy JINNY Silvan, RN  8:18 AM 12/25/2023

## 2023-12-25 NOTE — Telephone Encounter (Signed)
 Patient with diagnosis of atrial fibrillation on Eliquis  for anticoagulation.    Procedure:  lumpectomy    Date of Surgery:  Clearance 01/06/24         CHA2DS2-VASc Score = 6   This indicates a 9.7% annual risk of stroke. The patient's score is based upon: CHF History: 1 HTN History: 1 Diabetes History: 0 Stroke History: 0 Vascular Disease History: 1 Age Score: 2 Gender Score: 1   CrCl 43 Platelet count 188  Patient has not had an Afib/aflutter ablation within the last 3 months or DCCV within the last 30 days  Per office protocol, patient can hold Eliquis  for 2 days prior to procedure.   Patient will not need bridging with Lovenox  (enoxaparin ) around procedure.  **This guidance is not considered finalized until pre-operative APP has relayed final recommendations.**

## 2023-12-29 NOTE — Telephone Encounter (Signed)
 I added an addendum to my my prior note. Low RCRI risk. Ok to move forward.    Daphne Barrack, NP-C, AGACNP-BC  [facility] - Electrophysiology  12/29/2023, 1:14 PM

## 2023-12-29 NOTE — Telephone Encounter (Signed)
   Patient Name: Daisy SHAPPELL  DOB: May 28, 1941 MRN: [id number]  Primary Cardiologist: None  Chart reviewed as part of pre-operative protocol coverage. Given past medical history and time since last visit, based on ACC/AHA guidelines, CARNESHIA RAKER is at acceptable risk for the planned procedure without further cardiovascular testing.   See office note for guidance on holding Eliquis   The patient was advised that if she develops new symptoms prior to surgery to contact our office to arrange for a follow-up visit, and she verbalized understanding.  I will route this recommendation to the requesting party via Epic fax function and remove from pre-op pool.  Please call with questions.  Wyn Raddle, Jackee Shove, NP 12/29/2023, 1:15 PM

## 2023-12-29 NOTE — Progress Notes (Addendum)
 Per note on 12/25/23, Procedure may interfere with device function. Magnet should be placed over device during procedure and Provide continuous ECG monitoring when magnet is used or reprogramming is to be performed. Due to possible postoperative reprogramming of device, patient is a better candidate for surgery at [facility] OR. Sari at Dr. Milta office made aware of this information concerning surgery scheduled 01/06/24 at [facility].

## 2024-01-03 ENCOUNTER — Other Ambulatory Visit: Payer: Self-pay | Admitting: Internal Medicine

## 2024-01-03 DIAGNOSIS — I10 Essential (primary) hypertension: Secondary | ICD-10-CM

## 2024-01-05 ENCOUNTER — Other Ambulatory Visit (HOSPITAL_BASED_OUTPATIENT_CLINIC_OR_DEPARTMENT_OTHER): Payer: Self-pay

## 2024-01-05 ENCOUNTER — Institutional Professional Consult (permissible substitution) (INDEPENDENT_AMBULATORY_CARE_PROVIDER_SITE_OTHER): Admitting: Otolaryngology

## 2024-01-05 ENCOUNTER — Ambulatory Visit (INDEPENDENT_AMBULATORY_CARE_PROVIDER_SITE_OTHER): Admitting: Audiology

## 2024-01-05 DIAGNOSIS — I10 Essential (primary) hypertension: Secondary | ICD-10-CM

## 2024-01-05 MED ORDER — AMLODIPINE BESYLATE 10 MG PO TABS: 10.0000 mg | ORAL_TABLET | Freq: Every day | ORAL | 3 refills | Status: AC

## 2024-01-05 NOTE — Telephone Encounter (Signed)
*  STAT* If patient is at the pharmacy, call can be transferred to refill team.   1. Which medications need to be refilled? (please list name of each medication and dose if known) amLODipine  (NORVASC ) 10 MG tablet    2. Would you like to learn more about the convenience, safety, & potential cost savings by using the [facility]? N/A   3. Are you open to using the [facility] (Type [facility]. N/A   4. Which pharmacy/location (including street and city if local pharmacy) is medication to be sent to? [facility] - [place], [place] - [address]    5. Do they need a 30 day or 90 day supply? 90 Day  Patient was seen 06/09. She only has a few tablets left.

## 2024-01-06 ENCOUNTER — Encounter: Payer: Self-pay | Admitting: *Deleted

## 2024-01-06 ENCOUNTER — Encounter

## 2024-01-06 DIAGNOSIS — Z170 Estrogen receptor positive status [ER+]: Secondary | ICD-10-CM

## 2024-01-08 ENCOUNTER — Other Ambulatory Visit: Payer: Self-pay | Admitting: Surgery

## 2024-01-08 ENCOUNTER — Encounter: Payer: Self-pay | Admitting: Cardiology

## 2024-01-08 ENCOUNTER — Encounter

## 2024-01-08 DIAGNOSIS — C50911 Malignant neoplasm of unspecified site of right female breast: Secondary | ICD-10-CM

## 2024-01-08 NOTE — Progress Notes (Signed)
 Surgical Instructions   Your procedure is scheduled on Thursday January 15, 2024. Report to [facility] Main Entrance A at 10:30 A.M., then check in with the Admitting office. Any questions or running late day of surgery: call [phone]  Questions prior to your surgery date: call [phone], Monday-Friday, 8am-4pm. If you experience any cold or flu symptoms such as cough, fever, chills, shortness of breath, etc. between now and your scheduled surgery, please notify us  at the above number.     Remember:  Do not eat after midnight the night before your surgery   You may drink clear liquids until 9:30 the morning of your surgery.   Clear liquids allowed are: Water, Non-Citrus Juices (without pulp), Carbonated Beverages, Clear Tea (no milk, honey, etc.), Black Coffee Only (NO MILK, CREAM OR POWDERED CREAMER of any kind), and Gatorade.    Take these medicines the morning of surgery with A SIP OF WATER  amLODipine  (NORVASC )  azelastine  (ASTELIN ) 0.1 % nasal spray  levothyroxine  (SYNTHROID )  omeprazole  (PRILOSEC)    May take these medicines IF NEEDED: acetaminophen  (TYLENOL )  fluticasone  (FLONASE )  Polyethyl Glycol-Propyl Glycol (LUBRICATING EYE DROPS OP)  traMADol  (ULTRAM )   PER YOUR CARDIOLOGIST'S INSTRUCTIONS, HOLD YOUR apixaban  (ELIQUIS ) 2 DAYS PRIOR TO SURGERY WITH THE LAST DOSE BEING 01/12/2024.    One week prior to surgery, STOP taking any Aspirin  (unless otherwise instructed by your surgeon) Aleve , Naproxen , Ibuprofen, Motrin, Advil, Goody's, BC's, all herbal medications, fish oil, and non-prescription vitamins.  This includes your diclofenac  Sodium (VOLTAREN  ARTHRITIS PAIN) 1 % GEL.                       Do NOT Smoke (Tobacco/Vaping) for 24 hours prior to your procedure.  If you use a CPAP at night, you may bring your mask/headgear for your overnight stay.   You will be asked to remove any contacts, glasses, piercing's, hearing aid's, dentures/partials prior to surgery.  Please bring cases for these items if needed.    Patients discharged the day of surgery will not be allowed to drive home, and someone needs to stay with them for 24 hours.  SURGICAL WAITING ROOM VISITATION Patients may have no more than 2 support people in the waiting area - these visitors may rotate.   Pre-op nurse will coordinate an appropriate time for 1 ADULT support person, who may not rotate, to accompany patient in pre-op.  Children under the age of 16 must have an adult with them who is not the patient and must remain in the main waiting area with an adult.  If the patient needs to stay at the hospital during part of their recovery, the visitor guidelines for inpatient rooms apply.  Please refer to the [facility] website for the visitor guidelines for any additional information.   If you received a COVID test during your pre-op visit  it is requested that you wear a mask when out in public, stay away from anyone that may not be feeling well and notify your surgeon if you develop symptoms. If you have been in contact with anyone that has tested positive in the last 10 days please notify you surgeon.      Pre-operative CHG Bathing Instructions   You can play a key role in reducing the risk of infection after surgery. Your skin needs to be as free of germs as possible. You can reduce the number of germs on your skin by washing with CHG (chlorhexidine  gluconate) soap before  surgery. CHG is an antiseptic soap that kills germs and continues to kill germs even after washing.   DO NOT use if you have an allergy to chlorhexidine /CHG or antibacterial soaps. If your skin becomes reddened or irritated, stop using the CHG and notify one of our RNs at [phone].              TAKE A SHOWER THE NIGHT BEFORE SURGERY AND THE DAY OF SURGERY    Please keep in mind the following:  DO NOT shave, including legs and underarms, 48 hours prior to surgery.   Place clean sheets on your bed the night  before surgery Use a clean washcloth (not used since being washed) for each shower. DO NOT sleep with pet's night before surgery.  CHG Shower Instructions:  Wash your face and private area with normal soap. If you choose to wash your hair, wash first with your normal shampoo.  After you use shampoo/soap, rinse your hair and body thoroughly to remove shampoo/soap residue.  Turn the water OFF and apply half the bottle of CHG soap to a CLEAN washcloth.  Apply CHG soap ONLY FROM YOUR NECK DOWN TO YOUR TOES (washing for 3-5 minutes)  DO NOT use CHG soap on face, private areas, open wounds, or sores.  Pay special attention to the area where your surgery is being performed.  If you are having back surgery, having someone wash your back for you may be helpful. Wait 2 minutes after CHG soap is applied, then you may rinse off the CHG soap.  Pat dry with a clean towel  Put on clean pajamas    Additional instructions for the day of surgery: DO NOT APPLY any lotions, deodorants or perfumes.   Do not wear jewelry or makeup Do not wear nail polish, gel polish, artificial nails, or any other type of covering on natural nails (fingers and toes) Do not bring valuables to the hospital. [facility] is not responsible for valuables/personal belongings. Put on clean/comfortable clothes.  Please brush your teeth.  Ask your nurse before applying any prescription medications to the skin.

## 2024-01-08 NOTE — Progress Notes (Signed)
 PERIOPERATIVE PRESCRIPTION FOR IMPLANTED CARDIAC DEVICE PROGRAMMING  Patient Information: Name:  [name]  DOB:  04-08-1941  MRN:  [id number]  Planned Procedure:  Right breast seed lumpectomy bracketed  Surgeon:  Dr. Debby Shipper  Date of Procedure:  01/15/2024  Cautery will be used.  Position during surgery:  Supine   Please send documentation back to:  [facility] (Fax # [phone])  Device Information:  Clinic EP Physician:  Soyla Norton, MD   Device Type:  Pacemaker Manufacturer and Phone #:  Medtronic: [id number] Pacemaker Dependent?:  Yes.   Date of Last Device Check:  12/23/23 Normal Device Function?:  Yes.    Electrophysiologist's Recommendations:  Have magnet available. Provide continuous ECG monitoring when magnet is used or reprogramming is to be performed.  Procedure may interfere with device function.  Magnet should be placed over device during procedure.  Per Device Clinic Standing Orders, Daisy JINNY Silvan, RN  9:08 AM 01/08/2024

## 2024-01-09 ENCOUNTER — Encounter (HOSPITAL_COMMUNITY)
Admission: RE | Admit: 2024-01-09 | Discharge: 2024-01-09 | Disposition: A | Discharge status: Home / Self Care | Source: Ambulatory Visit | Attending: Surgery

## 2024-01-09 ENCOUNTER — Encounter (HOSPITAL_COMMUNITY): Payer: Self-pay

## 2024-01-09 ENCOUNTER — Other Ambulatory Visit: Payer: Self-pay

## 2024-01-09 VITALS — BP 131/60 | HR 97 | Temp 98.1°F | Resp 16 | Ht 63.0 in | Wt 137.2 lb

## 2024-01-09 DIAGNOSIS — Z7901 Long term (current) use of anticoagulants: Secondary | ICD-10-CM | POA: Insufficient documentation

## 2024-01-09 DIAGNOSIS — N183 Chronic kidney disease, stage 3 unspecified: Secondary | ICD-10-CM | POA: Diagnosis not present

## 2024-01-09 DIAGNOSIS — K449 Diaphragmatic hernia without obstruction or gangrene: Secondary | ICD-10-CM | POA: Insufficient documentation

## 2024-01-09 DIAGNOSIS — I340 Nonrheumatic mitral (valve) insufficiency: Secondary | ICD-10-CM | POA: Insufficient documentation

## 2024-01-09 DIAGNOSIS — E890 Postprocedural hypothyroidism: Secondary | ICD-10-CM | POA: Insufficient documentation

## 2024-01-09 DIAGNOSIS — I472 Ventricular tachycardia, unspecified: Secondary | ICD-10-CM | POA: Diagnosis not present

## 2024-01-09 DIAGNOSIS — I129 Hypertensive chronic kidney disease with stage 1 through stage 4 chronic kidney disease, or unspecified chronic kidney disease: Secondary | ICD-10-CM | POA: Insufficient documentation

## 2024-01-09 DIAGNOSIS — K219 Gastro-esophageal reflux disease without esophagitis: Secondary | ICD-10-CM | POA: Insufficient documentation

## 2024-01-09 DIAGNOSIS — I480 Paroxysmal atrial fibrillation: Secondary | ICD-10-CM | POA: Diagnosis not present

## 2024-01-09 DIAGNOSIS — Z01812 Encounter for preprocedural laboratory examination: Secondary | ICD-10-CM | POA: Insufficient documentation

## 2024-01-09 DIAGNOSIS — Z01818 Encounter for other preprocedural examination: Secondary | ICD-10-CM

## 2024-01-09 HISTORY — DX: Presence of cardiac pacemaker: Z95.0

## 2024-01-09 LAB — CBC
HCT: 38.5 % (ref 36.0–46.0)
Hemoglobin: 12.6 g/dL (ref 12.0–15.0)
MCH: 28.4 pg (ref 26.0–34.0)
MCHC: 32.7 g/dL (ref 30.0–36.0)
MCV: 86.7 fL (ref 80.0–100.0)
Platelets: 160 K/uL (ref 150–400)
RBC: 4.44 MIL/uL (ref 3.87–5.11)
RDW: 12.4 % (ref 11.5–15.5)
WBC: 5.9 K/uL (ref 4.0–10.5)
nRBC: 0 % (ref 0.0–0.2)

## 2024-01-09 LAB — BASIC METABOLIC PANEL WITH GFR
Anion gap: 12 (ref 5–15)
BUN: 20 mg/dL (ref 8–23)
CO2: 23 mmol/L (ref 22–32)
Calcium: 9.7 mg/dL (ref 8.9–10.3)
Chloride: 102 mmol/L (ref 98–111)
Creatinine, Ser: 0.96 mg/dL (ref 0.44–1.00)
GFR, Estimated: 58 mL/min — ABNORMAL LOW (ref >60)
Glucose, Bld: 124 mg/dL — ABNORMAL HIGH (ref 70–99)
Potassium: 4.1 mmol/L (ref 3.5–5.1)
Sodium: 137 mmol/L (ref 135–145)

## 2024-01-09 NOTE — Progress Notes (Addendum)
 PCP - Antonio Cyndee Jamee JONELLE, DO  Cardiologist - Aniceto Daphne CROME, NP  Electrophysiologist - Dr. Waddell  PPM/ICD - Pacemaker Device Orders - yes Rep Notified - yes  Chest x-ray -  EKG - 12-08-23 Stress Test - 2016 ECHO - 07-08-21 Cardiac Cath -   Sleep Study - denies CPAP - n/a  Dm -denies  Blood Thinner Instructions:apixaban  (ELIQUIS ) Last dose 01-12-24 Aspirin  Instructions: n/a  ERAS Protcol - clear liquids until 9:30 am   COVID TEST- n/a   Anesthesia review: Yes, Hx of A Fib Htn, and Pacemaker in place, and breast cancer. Patient does have appointment at [facility] on Tuesday January 13, 2024  Patient denies shortness of breath, fever, cough and chest pain at PAT appointment   All instructions explained to the patient, with a verbal understanding of the material. Patient agrees to go over the instructions while at home for a better understanding. Patient also instructed to self quarantine after being tested for COVID-19. The opportunity to ask questions was provided.

## 2024-01-12 ENCOUNTER — Encounter: Payer: Self-pay | Admitting: Family Medicine

## 2024-01-12 DIAGNOSIS — H6993 Unspecified Eustachian tube disorder, bilateral: Secondary | ICD-10-CM

## 2024-01-12 MED ORDER — AZELASTINE HCL 0.1 % NA SOLN: 1.0000 | Freq: Two times a day (BID) | NASAL | 12 refills | Status: DC

## 2024-01-12 NOTE — Anesthesia Preprocedure Evaluation (Signed)
 Anesthesia Evaluation  Patient identified by MRN, date of birth, ID band Patient awake    Reviewed: Allergy & Precautions, NPO status , Patient's Chart, lab work & pertinent test results  History of Anesthesia Complications (+) PONV and history of anesthetic complications  Airway Mallampati: II  TM Distance: >3 FB Neck ROM: Full    Dental no notable dental hx. (+) Teeth Intact, Dental Advisory Given   Pulmonary former smoker   Pulmonary exam normal breath sounds clear to auscultation       Cardiovascular hypertension, Pt. on medications Normal cardiovascular exam+ dysrhythmias (CHB) + pacemaker  Rhythm:Regular Rate:Normal  07/2021 TTE  1. Sigmoid septum noted without significant LVOT gradient. Left  ventricular ejection fraction, by estimation, is 60 to 65%. The left  ventricle has normal function. The left ventricle has no regional wall  motion abnormalities. There is mild left  ventricular hypertrophy. Left ventricular diastolic parameters are  consistent with Grade II diastolic dysfunction (pseudonormalization).   2. Right ventricular systolic function is normal. The right ventricular  size is normal. There is mildly elevated pulmonary artery systolic  pressure.   3. Left atrial size was mildly dilated.   4. Right atrial size was mildly dilated.   5. The mitral valve is normal in structure. Mild to moderate mitral valve  regurgitation. No evidence of mitral stenosis.   6. The aortic valve is normal in structure. Aortic valve regurgitation is  not visualized. No aortic stenosis is present.   7. The inferior vena cava is normal in size with greater than 50%  respiratory variability, suggesting right atrial pressure of 3 mmHg.     Neuro/Psych    GI/Hepatic ,GERD  Medicated and Controlled,,  Endo/Other  Hypothyroidism    Renal/GU Renal InsufficiencyRenal diseaseS/P nephrectomy Lab Results      Component                 Value               Date                          K                        4.1                 01/09/2024                   BUN                      20                  01/09/2024                CREATININE               0.96                01/09/2024                GFRNONAA                 58 (L)              01/09/2024                              Musculoskeletal   Abdominal  Peds  Hematology Eliquis  (Apixaban ) Lab Results      Component                Value               Date                      WBC                      5.9                 01/09/2024                HGB                      12.6                01/09/2024                HCT                      38.5                01/09/2024                MCV                      86.7                01/09/2024                PLT                      160                 01/09/2024              Anesthesia Other Findings   Reproductive/Obstetrics                              Anesthesia Physical Anesthesia Plan  ASA: 3  Anesthesia Plan: General   Post-op Pain Management: Ofirmev  IV (intra-op)* and Precedex    Induction: Intravenous  PONV Risk Score and Plan: 2 and Propofol  infusion, TIVA, Treatment may vary due to age or medical condition and Ondansetron   Airway Management Planned: LMA  Additional Equipment: None  Intra-op Plan:   Post-operative Plan: Extubation in OR  Informed Consent: I have reviewed the patients History and Physical, chart, labs and discussed the procedure including the risks, benefits and alternatives for the proposed anesthesia with the patient or authorized representative who has indicated his/her understanding and acceptance.     Dental advisory given  Plan Discussed with: CRNA and Surgeon  Anesthesia Plan Comments: ( )         Anesthesia Quick Evaluation

## 2024-01-12 NOTE — Progress Notes (Signed)
 Anesthesia Chart Review:  83 year old female follows with cardiology for history of CHB s/p PPM, paroxysmal A-fib on Eliquis , NSVT, HTN.  Nuclear stress 08/2020 was low risk.  Echo 07/2021 showed EF 60 to 65%, normal wall motion, grade 2 DD, normal RV, mildly elevated PASP, mild to moderate mitral regurgitation.  16th I do not think patient's Medtronic PPM reached ERI 11/18/2023 and she had generator change out with dual-chamber PPM placed 12/09/2023 by Dr. Waddell.  Preop clearance per note by Daphne Barrack, NP on 12/08/2023 (clearance added an addendum on 12/29/2023), Ms. Kostka's perioperative risk of a major cardiac event is 0.4% according to the Revised Cardiac Risk Index (RCRI).  Therefore, she is at low risk for perioperative complications. Recommendations: According to ACC/AHA guidelines, no further cardiovascular testing needed.  The patient may proceed to surgery at acceptable risk.  Antiplatelet and/or Anticoagulation Recommendations: Eliquis  (Apixaban ) can be held for 2 days prior to surgery.  Please resume post op when felt to be safe.  Patient reports last dose of Eliquis  01/12/2024.  Other pertinent history includes PONV, GERD, hiatal hernia, renal cancer s/p left nephrectomy, CKD 3, hypothyroidism s/p thyroidectomy.  Preop labs reviewed, unremarkable.  EKG 12/08/23: Ventricular paced rhythm.  Rate 55.  Perioperative prescription for plan to cardiac device programming per progress note 01/08/2024: Device Information:   Clinic EP Physician:  Soyla Norton, MD    Device Type:  Pacemaker Manufacturer and Phone #:  Medtronic: [id number] Pacemaker Dependent?:  Yes.   Date of Last Device Check:  12/23/23           Normal Device Function?:  Yes.     Electrophysiologist's Recommendations:   Have magnet available. Provide continuous ECG monitoring when magnet is used or reprogramming is to be performed.  Procedure may interfere with device function.  Magnet should be placed over device during  procedure.  TTE 07/18/2021: 1. Sigmoid septum noted without significant LVOT gradient. Left  ventricular ejection fraction, by estimation, is 60 to 65%. The left  ventricle has normal function. The left ventricle has no regional wall  motion abnormalities. There is mild left  ventricular hypertrophy. Left ventricular diastolic parameters are  consistent with Grade II diastolic dysfunction (pseudonormalization).   2. Right ventricular systolic function is normal. The right ventricular  size is normal. There is mildly elevated pulmonary artery systolic  pressure.   3. Left atrial size was mildly dilated.   4. Right atrial size was mildly dilated.   5. The mitral valve is normal in structure. Mild to moderate mitral valve  regurgitation. No evidence of mitral stenosis.   6. The aortic valve is normal in structure. Aortic valve regurgitation is  not visualized. No aortic stenosis is present.   7. The inferior vena cava is normal in size with greater than 50%  respiratory variability, suggesting right atrial pressure of 3 mmHg.   Nuclear stress 09/16/2020: IMPRESSION: 1. Matched reduced activity in the inferior wall is somewhat large, but probably from diaphragmatic attenuation rather than scarring given the reasonable wall motion in this vicinity.   2. Mild apical hypokinesis.   3. Left ventricular ejection fraction 65%   4. Non invasive risk stratification*: Low    Lynwood Geofm RIGGERS [facility] Short Stay Center/Anesthesiology Phone [phone] 01/12/2024 3:55 PM

## 2024-01-13 ENCOUNTER — Ambulatory Visit
Admission: RE | Admit: 2024-01-13 | Discharge: 2024-01-13 | Disposition: A | Discharge status: Home / Self Care | Source: Ambulatory Visit | Attending: Surgery | Admitting: Surgery

## 2024-01-13 ENCOUNTER — Telehealth: Payer: Self-pay | Admitting: Hematology and Oncology

## 2024-01-13 DIAGNOSIS — C50911 Malignant neoplasm of unspecified site of right female breast: Secondary | ICD-10-CM

## 2024-01-13 HISTORY — PX: BREAST BIOPSY: SHX20

## 2024-01-13 NOTE — Telephone Encounter (Signed)
 Rescheule appointment per provider pal.  Called left VM with changes made to the upcoming appointment.

## 2024-01-14 ENCOUNTER — Inpatient Hospital Stay: Admitting: Hematology and Oncology

## 2024-01-15 ENCOUNTER — Ambulatory Visit
Admission: RE | Admit: 2024-01-15 | Discharge: 2024-01-15 | Disposition: A | Discharge status: Home / Self Care | Source: Ambulatory Visit | Attending: Surgery | Admitting: Surgery

## 2024-01-15 ENCOUNTER — Ambulatory Visit (HOSPITAL_BASED_OUTPATIENT_CLINIC_OR_DEPARTMENT_OTHER): Payer: Self-pay | Admitting: Anesthesiology

## 2024-01-15 ENCOUNTER — Other Ambulatory Visit: Payer: Self-pay | Admitting: Surgery

## 2024-01-15 ENCOUNTER — Ambulatory Visit (HOSPITAL_COMMUNITY)
Admission: RE | Admit: 2024-01-15 | Discharge: 2024-01-15 | Disposition: A | Discharge status: Home / Self Care | Attending: Surgery | Admitting: Surgery

## 2024-01-15 ENCOUNTER — Ambulatory Visit (HOSPITAL_COMMUNITY): Payer: Self-pay | Admitting: Physician Assistant

## 2024-01-15 ENCOUNTER — Other Ambulatory Visit: Payer: Self-pay

## 2024-01-15 ENCOUNTER — Other Ambulatory Visit (HOSPITAL_BASED_OUTPATIENT_CLINIC_OR_DEPARTMENT_OTHER): Payer: Self-pay

## 2024-01-15 ENCOUNTER — Encounter (HOSPITAL_COMMUNITY)
Admission: RE | Disposition: A | Discharge status: Home / Self Care | Payer: Self-pay | Source: Home / Self Care | Attending: Surgery

## 2024-01-15 DIAGNOSIS — Z905 Acquired absence of kidney: Secondary | ICD-10-CM | POA: Insufficient documentation

## 2024-01-15 DIAGNOSIS — N289 Disorder of kidney and ureter, unspecified: Secondary | ICD-10-CM | POA: Insufficient documentation

## 2024-01-15 DIAGNOSIS — C50911 Malignant neoplasm of unspecified site of right female breast: Secondary | ICD-10-CM

## 2024-01-15 DIAGNOSIS — K219 Gastro-esophageal reflux disease without esophagitis: Secondary | ICD-10-CM | POA: Insufficient documentation

## 2024-01-15 DIAGNOSIS — N6091 Unspecified benign mammary dysplasia of right breast: Secondary | ICD-10-CM | POA: Insufficient documentation

## 2024-01-15 DIAGNOSIS — I10 Essential (primary) hypertension: Secondary | ICD-10-CM | POA: Insufficient documentation

## 2024-01-15 DIAGNOSIS — N6489 Other specified disorders of breast: Secondary | ICD-10-CM | POA: Insufficient documentation

## 2024-01-15 DIAGNOSIS — E890 Postprocedural hypothyroidism: Secondary | ICD-10-CM | POA: Diagnosis not present

## 2024-01-15 DIAGNOSIS — Z79899 Other long term (current) drug therapy: Secondary | ICD-10-CM | POA: Diagnosis not present

## 2024-01-15 DIAGNOSIS — C50811 Malignant neoplasm of overlapping sites of right female breast: Secondary | ICD-10-CM

## 2024-01-15 DIAGNOSIS — Z170 Estrogen receptor positive status [ER+]: Secondary | ICD-10-CM

## 2024-01-15 DIAGNOSIS — Z87891 Personal history of nicotine dependence: Secondary | ICD-10-CM

## 2024-01-15 DIAGNOSIS — Z950 Presence of cardiac pacemaker: Secondary | ICD-10-CM | POA: Insufficient documentation

## 2024-01-15 DIAGNOSIS — D241 Benign neoplasm of right breast: Secondary | ICD-10-CM | POA: Insufficient documentation

## 2024-01-15 HISTORY — PX: BREAST LUMPECTOMY WITH RADIOACTIVE SEED LOCALIZATION: SHX6424

## 2024-01-15 LAB — SURGICAL PCR SCREEN
MRSA, PCR: NEGATIVE
Staphylococcus aureus: NEGATIVE

## 2024-01-15 SURGERY — BREAST LUMPECTOMY WITH RADIOACTIVE SEED LOCALIZATION
Anesthesia: General | Site: Breast | Laterality: Right

## 2024-01-15 MED ORDER — CHLORHEXIDINE GLUCONATE 0.12 % MT SOLN
15.0000 mL | Freq: Once | OROMUCOSAL | Status: AC
  Administered 2024-01-15: 15 mL via OROMUCOSAL
  Filled 2024-01-15: qty 15

## 2024-01-15 MED ORDER — CHLORHEXIDINE GLUCONATE CLOTH 2 % EX PADS: 6.0000 | MEDICATED_PAD | Freq: Once | CUTANEOUS | Status: DC

## 2024-01-15 MED ORDER — OXYCODONE HCL 5 MG PO TABS
5.0000 mg | ORAL_TABLET | Freq: Four times a day (QID) | ORAL | 0 refills | Status: DC | PRN
  Filled 2024-01-15: qty 15, 4d supply, fill #0

## 2024-01-15 MED ORDER — CELECOXIB 200 MG PO CAPS
200.0000 mg | ORAL_CAPSULE | ORAL | Status: AC
  Administered 2024-01-15: 200 mg via ORAL
  Filled 2024-01-15: qty 1

## 2024-01-15 MED ORDER — PROPOFOL 10 MG/ML IV BOLUS
INTRAVENOUS | Status: DC | PRN
  Administered 2024-01-15: 100 mg via INTRAVENOUS

## 2024-01-15 MED ORDER — HEMOSTATIC AGENTS (NO CHARGE) OPTIME
TOPICAL | Status: DC | PRN
Start: 2024-01-15 — End: 2024-01-15
  Administered 2024-01-15: 1

## 2024-01-15 MED ORDER — BUPIVACAINE-EPINEPHRINE (PF) 0.25% -1:200000 IJ SOLN
INTRAMUSCULAR | Status: AC
  Filled 2024-01-15: qty 30

## 2024-01-15 MED ORDER — PROPOFOL 500 MG/50ML IV EMUL
INTRAVENOUS | Status: DC | PRN
  Administered 2024-01-15: 125 ug/kg/min via INTRAVENOUS

## 2024-01-15 MED ORDER — FENTANYL CITRATE (PF) 250 MCG/5ML IJ SOLN
INTRAMUSCULAR | Status: AC
Start: 2024-01-15 — End: 2024-01-15
  Filled 2024-01-15: qty 5

## 2024-01-15 MED ORDER — PHENYLEPHRINE 80 MCG/ML (10ML) SYRINGE FOR IV PUSH (FOR BLOOD PRESSURE SUPPORT)
PREFILLED_SYRINGE | INTRAVENOUS | Status: AC
  Filled 2024-01-15: qty 30

## 2024-01-15 MED ORDER — ROCURONIUM BROMIDE 10 MG/ML (PF) SYRINGE
PREFILLED_SYRINGE | INTRAVENOUS | Status: AC
  Filled 2024-01-15: qty 20

## 2024-01-15 MED ORDER — EPHEDRINE 5 MG/ML INJ
INTRAVENOUS | Status: AC
  Filled 2024-01-15: qty 10

## 2024-01-15 MED ORDER — FENTANYL CITRATE (PF) 250 MCG/5ML IJ SOLN
INTRAMUSCULAR | Status: DC | PRN
  Administered 2024-01-15: 50 ug via INTRAVENOUS
  Administered 2024-01-15: 25 ug via INTRAVENOUS

## 2024-01-15 MED ORDER — CEFAZOLIN SODIUM-DEXTROSE 2-4 GM/100ML-% IV SOLN
2.0000 g | INTRAVENOUS | Status: AC
  Administered 2024-01-15: 2 g via INTRAVENOUS
  Filled 2024-01-15: qty 100

## 2024-01-15 MED ORDER — ONDANSETRON HCL 4 MG/2ML IJ SOLN
INTRAMUSCULAR | Status: AC
  Filled 2024-01-15: qty 6

## 2024-01-15 MED ORDER — LACTATED RINGERS IV SOLN: INTRAVENOUS | Status: DC

## 2024-01-15 MED ORDER — ACETAMINOPHEN 500 MG PO TABS
1000.0000 mg | ORAL_TABLET | ORAL | Status: DC
  Filled 2024-01-15: qty 2

## 2024-01-15 MED ORDER — ONDANSETRON HCL 4 MG/2ML IJ SOLN
INTRAMUSCULAR | Status: DC | PRN
  Administered 2024-01-15: 4 mg via INTRAVENOUS

## 2024-01-15 MED ORDER — FENTANYL CITRATE (PF) 100 MCG/2ML IJ SOLN
25.0000 ug | INTRAMUSCULAR | Status: DC | PRN
  Administered 2024-01-15 (×2): 50 ug via INTRAVENOUS

## 2024-01-15 MED ORDER — 0.9 % SODIUM CHLORIDE (POUR BTL) OPTIME
TOPICAL | Status: DC | PRN
  Administered 2024-01-15: 1000 mL

## 2024-01-15 MED ORDER — DEXMEDETOMIDINE HCL IN NACL 80 MCG/20ML IV SOLN
INTRAVENOUS | Status: DC | PRN
  Administered 2024-01-15: 8 ug via INTRAVENOUS

## 2024-01-15 MED ORDER — ORAL CARE MOUTH RINSE: 15.0000 mL | Freq: Once | OROMUCOSAL | Status: AC

## 2024-01-15 MED ORDER — FENTANYL CITRATE (PF) 100 MCG/2ML IJ SOLN
INTRAMUSCULAR | Status: AC
  Filled 2024-01-15: qty 2

## 2024-01-15 MED ORDER — PHENYLEPHRINE HCL-NACL 20-0.9 MG/250ML-% IV SOLN
INTRAVENOUS | Status: DC | PRN
Start: 2024-01-15 — End: 2024-01-15
  Administered 2024-01-15: 25 ug/min via INTRAVENOUS

## 2024-01-15 MED ORDER — LIDOCAINE 2% (20 MG/ML) 5 ML SYRINGE
INTRAMUSCULAR | Status: DC | PRN
  Administered 2024-01-15: 100 mg via INTRAVENOUS

## 2024-01-15 MED ORDER — LIDOCAINE 2% (20 MG/ML) 5 ML SYRINGE
INTRAMUSCULAR | Status: AC
Start: 2024-01-15 — End: 2024-01-15
  Filled 2024-01-15: qty 10

## 2024-01-15 MED ORDER — DEXAMETHASONE SODIUM PHOSPHATE 10 MG/ML IJ SOLN
INTRAMUSCULAR | Status: DC | PRN
  Administered 2024-01-15: 5 mg via INTRAVENOUS

## 2024-01-15 MED ORDER — DEXAMETHASONE SODIUM PHOSPHATE 10 MG/ML IJ SOLN
INTRAMUSCULAR | Status: AC
  Filled 2024-01-15: qty 3

## 2024-01-15 MED ORDER — PHENYLEPHRINE 80 MCG/ML (10ML) SYRINGE FOR IV PUSH (FOR BLOOD PRESSURE SUPPORT)
PREFILLED_SYRINGE | INTRAVENOUS | Status: DC | PRN
  Administered 2024-01-15: 80 ug via INTRAVENOUS

## 2024-01-15 MED ORDER — BUPIVACAINE-EPINEPHRINE 0.25% -1:200000 IJ SOLN
INTRAMUSCULAR | Status: DC | PRN
Start: 2024-01-15 — End: 2024-01-15
  Administered 2024-01-15: 20 mL

## 2024-01-15 SURGICAL SUPPLY — 28 items
BAG COUNTER SPONGE SURGICOUNT (BAG) ×1 IMPLANT
BINDER BREAST LRG (GAUZE/BANDAGES/DRESSINGS) IMPLANT
CANISTER SUCTION 3000ML PPV (SUCTIONS) IMPLANT
CHLORAPREP W/TINT 26 (MISCELLANEOUS) ×1 IMPLANT
CLIP APPLIE 9.375 MED OPEN (MISCELLANEOUS) IMPLANT
COVER PROBE W GEL 5X96 (DRAPES) ×1 IMPLANT
COVER SURGICAL LIGHT HANDLE (MISCELLANEOUS) ×1 IMPLANT
DERMABOND ADVANCED .7 DNX12 (GAUZE/BANDAGES/DRESSINGS) ×1 IMPLANT
DEVICE DUBIN SPECIMEN MAMMOGRA (MISCELLANEOUS) ×1 IMPLANT
DRAPE CHEST BREAST 15X10 FENES (DRAPES) ×1 IMPLANT
ELECT CAUTERY BLADE 6.4 (BLADE) ×1 IMPLANT
ELECTRODE REM PT RTRN 9FT ADLT (ELECTROSURGICAL) ×1 IMPLANT
GAUZE PAD ABD 8X10 STRL (GAUZE/BANDAGES/DRESSINGS) ×1 IMPLANT
GLOVE BIO SURGEON STRL SZ8 (GLOVE) ×1 IMPLANT
GLOVE BIOGEL PI IND STRL 8 (GLOVE) ×1 IMPLANT
GOWN STRL REUS W/ TWL LRG LVL3 (GOWN DISPOSABLE) ×1 IMPLANT
GOWN STRL REUS W/ TWL XL LVL3 (GOWN DISPOSABLE) ×1 IMPLANT
HEMOSTAT ARISTA ABSORB 3G PWDR (HEMOSTASIS) IMPLANT
KIT BASIN OR (CUSTOM PROCEDURE TRAY) ×1 IMPLANT
KIT MARKER MARGIN INK (KITS) ×1 IMPLANT
NDL HYPO 25GX1X1/2 BEV (NEEDLE) ×1 IMPLANT
NEEDLE HYPO 25GX1X1/2 BEV (NEEDLE) ×1 IMPLANT
NS IRRIG 1000ML POUR BTL (IV SOLUTION) IMPLANT
PACK GENERAL/GYN (CUSTOM PROCEDURE TRAY) ×1 IMPLANT
SUT MNCRL AB 4-0 PS2 18 (SUTURE) ×1 IMPLANT
SUT VIC AB 3-0 SH 8-18 (SUTURE) ×1 IMPLANT
SYR CONTROL 10ML LL (SYRINGE) ×1 IMPLANT
TOWEL GREEN STERILE FF (TOWEL DISPOSABLE) IMPLANT

## 2024-01-15 NOTE — Transfer of Care (Signed)
 Immediate Anesthesia Transfer of Care Note  Patient: [name]  Procedure(s) Performed: BREAST LUMPECTOMY WITH RADIOACTIVE SEED LOCALIZATION (Right: Breast)  Patient Location: PACU  Anesthesia Type:General  Level of Consciousness: alert  and drowsy  Airway & Oxygen Therapy: Patient Spontanous Breathing and Patient connected to face mask  Post-op Assessment: Report given to RN and Post -op Vital signs reviewed and stable  Post vital signs: Reviewed  Last Vitals:  Vitals Value Taken Time  BP 135/73 01/15/24 14:06  Temp    Pulse 75 01/15/24 14:11  Resp 11 01/15/24 14:11  SpO2 93 % 01/15/24 14:11  Vitals shown include unfiled device data.  Last Pain:  Vitals:   01/15/24 1406  TempSrc:   PainSc: 0-No pain         Complications: There were no known notable events for this encounter.

## 2024-01-15 NOTE — H&P (Signed)
 Pt seen in the [facility] for newly diagnosed right breast cancer overlapping sites. She felt a lump a fem months ago. 2 massed at 2 oclock IDC grade 2 ER 2% PR POS HER 2 NEU NEG 1.5 cm and 1.7 cm. No other complaints.   Review of Systems: A complete review of systems was obtained from the patient. I have reviewed this information and discussed as appropriate with the patient. See HPI as well for other ROS.    Medical History: Past Medical History:  Diagnosis Date  GERD (gastroesophageal reflux disease)  Hypertension  Thyroid  disease   Patient Active Problem List  Diagnosis  Soft tissue mass  Abdominal pain  Low back pain with radiation  Acute non-recurrent pansinusitis  Abnormal weight loss  Aortic ectasia, unspecified site ()  Atrioventricular block, complete (CMS/HHS-HCC)  Chronic diastolic heart failure (CMS/HHS-HCC)  Essential hypertension  Depression  Degenerative disc disease, cervical  Ganglion of tendon sheath  Gallbladder calculus  Frequency of urination  Gastroesophageal reflux disease  H/O seasonal allergies  History of vertebral compression fracture  Hordeolum externum of left upper eyelid  Lipoma  Hypothyroidism, postsurgical  Lipoma of left upper extremity  Malignant neoplasm of overlapping sites of right breast in female, estrogen receptor positive (CMS/HHS-HCC)  Nontoxic multinodular goiter  Neuropathy  Neoplasm of uncertain behavior of thyroid  gland  Pacemaker  Paroxysmal atrial fibrillation (CMS/HHS-HCC)  PUD (peptic ulcer disease)  Pulmonary nodule  Varicose veins of bilateral lower extremities with other complications  Vertebral compression fracture (CMS/HHS-HCC)  V-tach (CMS/HHS-HCC)   Past Surgical History:  Procedure Laterality Date  Pacemaker placement 2004  LAPAROSCOPY VENTRAL HERNIA REPAIR/ EXTENSIVE LYSIS ADHESIONS 2005  THYROIDECTOMY TOTAL 2014  Cystoscopy with retrograde pyelogram, ureteroscopy and stent placement Left 2017  LAPAROSCOPIC  NEPHRECTOMY ROBOT ASSISTED Left 2017  breast biopsy (right) Right 12/03/2023  KYPHOPLASTY    Allergies  Allergen Reactions  Adhesive Other (See Comments)  Adhesive Tape-Silicones Other (See Comments)  Latex, Natural Rubber Rash  Codeine Vomiting, Nausea And Vomiting and Other (See Comments)  Latex Itching and Rash   Current Outpatient Medications on File Prior to Visit  Medication Sig Dispense Refill  acetaminophen  (TYLENOL ) 500 MG tablet Take by mouth  acetaminophen /diphenhydramine  (TYLENOL  PM ORAL) 1 tab Oral as needed  alendronate  (FOSAMAX ) 70 MG tablet Take by mouth  amLODIPine  (NORVASC ) 10 MG tablet Take 1 tablet by mouth once daily  apixaban  (ELIQUIS ) 5 mg tablet Take 5 mg by mouth 2 (two) times daily  azelastine  (ASTELIN ) 137 mcg nasal spray Place 1 spray into both nostrils 2 (two) times daily  cholecalciferol (VITAMIN D3) 5,000 unit capsule Take 5,000 Units by mouth once daily  cyclobenzaprine  (FLEXERIL ) 5 MG tablet Take 5 mg by mouth 3 (three) times daily as needed  denosumab  (PROLIA ) 60 mg/mL inj syringe Inject subcutaneously  diclofenac  (VOLTAREN ) 1 % topical gel Apply 4 g topically 4 (four) times daily  fluticasone  propionate (FLONASE ) 50 mcg/actuation nasal spray Place 2 sprays into one nostril once daily as needed  fluticasone  propionate (FLONASE ) 50 mcg/actuation nasal spray Place 2 sprays into both nostrils once daily as needed for Rhinitis or Allergies  hydroCHLOROthiazide  (MICROZIDE ) 12.5 mg capsule Take 12.5 mg by mouth once daily  levocetirizine (XYZAL ) 5 MG tablet Take 5 mg by mouth once daily  levothyroxine  (SYNTHROID ) 100 MCG tablet Take 1 tablet by mouth once daily  lisinopriL  (ZESTRIL ) 40 MG tablet Take 40 mg by mouth once daily  loratadine  (CLARITIN ) 10 mg tablet Take by mouth  omeprazole  (PRILOSEC)  20 MG DR capsule Take 20 mg by mouth as directed Take 20 mg by mouth daily as needed (acid reflux). Take for 14 days at a time then stop until needed again   traMADoL  (ULTRAM ) 50 mg tablet Take 50-100 mg by mouth every 6 (six) hours as needed for Pain   No current facility-administered medications on file prior to visit.   Family History  Problem Relation Age of Onset  High blood pressure (Hypertension) Mother  Stroke Mother  High blood pressure (Hypertension) Father  High blood pressure (Hypertension) Sister  High blood pressure (Hypertension) Brother    Social History   Tobacco Use  Smoking Status Former  Types: Cigarettes  Smokeless Tobacco Never    Social History   Socioeconomic History  Marital status: Single  Tobacco Use  Smoking status: Former  Types: Cigarettes  Smokeless tobacco: Never  Vaping Use  Vaping status: Unknown  Substance and Sexual Activity  Alcohol use: Never  Drug use: Never   Social Drivers of Corporate investment banker Strain: Low Risk (07/28/2023)  Received from [facility]  Overall Financial Resource Strain (CARDIA)  Difficulty of Paying Living Expenses: Not very hard  Food Insecurity: No Food Insecurity (07/28/2023)  Received from [facility]  Hunger Vital Sign  Within the past 12 months, you worried that your food would run out before you got the money to buy more.: Never true  Within the past 12 months, the food you bought just didn't last and you didn't have money to get more.: Never true  Transportation Needs: No Transportation Needs (07/28/2023)  [facility]  Lack of Transportation (Medical): No  Lack of Transportation (Non-Medical): No  Physical Activity: Insufficiently Active (07/28/2023)  Received from [name]  Exercise Vital Sign  On average, how many days per week do you engage in moderate to strenuous exercise (like a brisk walk)?: 2 days  On average, how many minutes do you engage in exercise at this level?: 20 min  Stress: No Stress Concern Present (07/28/2023)  Received from [facility] -  Occupational Stress Questionnaire  Feeling of Stress : Not at all  Social Connections: Unknown (07/28/2023)  Received from [facility]  Social Connection and Isolation Panel  In a typical week, how many times do you talk on the phone with family, friends, or neighbors?: More than three times a week  How often do you get together with friends or relatives?: Once a week  How often do you attend church or religious services?: Patient declined  Do you belong to any clubs or organizations such as church groups, unions, fraternal or athletic groups, or school groups?: No  How often do you attend meetings of the clubs or organizations you belong to?: Never  Are you married, widowed, divorced, separated, never married, or living with a partner?: Divorced   Objective:  There were no vitals filed for this visit.  There is no height or weight on file to calculate BMI.  Physical Exam Exam conducted with a chaperone present.  HENT:  Head: Normocephalic.   Cardiovascular:  Rate and Rhythm: Normal rate.  Pulmonary:  Effort: Pulmonary effort is normal.  Chest:  Breasts: Right: Mass present.   Comments: New pacemker left chest with dressing  Mass 2 oclock mobile 1- 2 cm   Musculoskeletal:  Cervical back: Normal range of motion.  Lymphadenopathy:  Upper Body:  Right upper body: No supraclavicular or axillary adenopathy.  Left upper body: No supraclavicular or axillary adenopathy.   Neurological:  Mental Status: She is alert.     Labs, Imaging and Diagnostic Testing:  DIGITAL SCREENING BILATERAL MAMMOGRAM WITH TOMOSYNTHESIS AND CAD   TECHNIQUE:  Bilateral screening digital craniocaudal and mediolateral oblique  mammograms were obtained. Bilateral screening digital breast  tomosynthesis was performed. The images were evaluated with  computer-aided detection.   COMPARISON: Previous exam(s).   ACR Breast Density Category b: There are scattered areas of  fibroglandular density.    FINDINGS:  In the right breast, possible masses and separate calcifications  warrant further evaluation.   In the left breast, possible calcifications warrant further  evaluation.   IMPRESSION:  Further evaluation is suggested for possible masses and separate  calcifications in the right breast.   Further evaluation is suggested for possible calcifications in the  left breast.   RECOMMENDATION:  Diagnostic mammogram and possibly ultrasound of both breasts.  (Code:FI-B-34M)   The patient will be contacted regarding the findings, and additional  imaging will be scheduled.   BI-RADS CATEGORY 0: Incomplete: Need additional imaging evaluatio   CLINICAL DATA: Screening recall for RIGHT breast masses and  BILATERAL breast calcifications.   EXAM:  DIGITAL DIAGNOSTIC BILATERAL MAMMOGRAM WITH TOMOSYNTHESIS AND CAD;  ULTRASOUND RIGHT BREAST LIMITED   TECHNIQUE:  Bilateral digital diagnostic mammography and breast tomosynthesis  was performed. The images were evaluated with computer-aided  detection. ; Targeted ultrasound examination of the right breast was  performed   COMPARISON: Previous exam(s).   ACR Breast Density Category b: There are scattered areas of  fibroglandular density.   FINDINGS:  MAMMOGRAM:   Right:   Spot tomosynthesis views demonstrate 2 adjacent masses in the upper  inner right breast as follows:   Mass 1: 2.0 cm irregular mass in the upper inner quadrant posterior  depth (spot CC image 18/36).   Mass 2: 1.4 cm irregular mass in the upper inner quadrant middle  depth (spot CC image 19/36).   These masses correspond with the masses seen on screening mammogram  and are new compared to prior examinations.   Spot magnification views demonstrate an 8 mm group of coarse  heterogeneous calcification in the upper slightly outer right breast  middle depth. This is not definitely stable compared to prior  examinations.   The additional questioned  calcifications in the right breast are  mammographically stable since 2008, consistent with a benign  etiology.   Left:   Spot magnification views demonstrate scattered and loosely grouped  round and coarse heterogeneous calcification in the left breast,  favored to be mammographically stable since at least 2016,  consistent with a benign etiology. No new suspicious mass,  calcification, or other findings are identified in the left breast.   ULTRASOUND:   Targeted right breast ultrasound was performed:   At 2 o'clock 10 cm from the nipple, there is a round irregular  hypoechoic mass. It measures 1.7 x 1.4 x 1.5 cm. This corresponds  with mass 1 seen on mammogram.   At 2 o'clock 5 cm from the nipple, there is an oval irregular  hypoechoic mass. It measures 1.5 x 0.9 x 1.2 cm. This corresponds  with mass 2 seen on mammogram.   Targeted right axillary ultrasound demonstrates morphologically  benign lymph nodes. No lymphadenopathy.   IMPRESSION:  1. There are 2 suspicious masses in the RIGHT breast 2 o'clock  position 5-10 cm from the nipple. Recommend further assessment with  ultrasound-guided biopsy.  2.  RIGHT breast 8 mm group of coarse heterogeneous calcification in  the upper-outer quadrant is indeterminate. Recommend further  assessment with stereotactic guided biopsy.  3. No RIGHT axillary lymphadenopathy.  4. No evidence of malignancy in the LEFT breast.   RECOMMENDATION:  1. RIGHT breast ultrasound-guided biopsy (2 site).  2. RIGHT breast stereotactic guided biopsy (1 site).   I have discussed the findings and recommendations with the patient.  The biopsy procedure was discussed with the patient and questions  were answered. Patient expressed their understanding of the biopsy  recommendation. Patient will be scheduled for biopsy at her earliest  convenience by the schedulers. Ordering provider will be notified.  If applicable, a reminder letter will be sent to  the patient  regarding the next appointment.   BI-RADS CATEGORY 4: Suspicious.   Electronically Signed  By: Dirk Arrant M.D.  FINAL DIAGNOSIS   1. 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   2. Breast, right, needle core biopsy, 2:00 5cmfn ( coil clip) :  - INVASIVE DUCTAL CARCINOMAWITH MUCINOUS FEATURES.  - TUBULE FORMATION: SCORE 3  - NUC LEAR PLEOMORPHISM: SCORE 2  - MITOTIC COUNT: SCORE 1  - TOTAL SCORE: 6  - OVERALL GRADE: 2  - LYMPHOVASCULAR INVASION: NOT IDENTIFIED  - CANCER LENGTH: 9 MM  - CALCIFICATIONS: PRESENT  - DUCTAL CARCINOMA IN SITU: PRESENT, INTERMEDIATE GRADE  - SEE NOTE.   3. Breast, right, needle core biopsy, 2:00 10cmfn ( ribbon clip) :  - INVASIVE DUCTAL CARCINOMA WITH MUCINOUS FEATURES.  - TUBULE FORMATION: SCORE 3  - NUC LEAR PLEOMORPHISM: SCORE 2  - MITOTIC COUNT: SCORE 1  - TOTAL SCORE: 6  - OVERALL GRADE: 2  - LYMPHOVASCULAR INVASION: NOT IDENTIFIED  - CANCER LENGTH: 8 MM  - CALCIFICATIONS: PRESENT  - DUCTAL CARCINOMA IN SITU: NOT IDENTIFIED  - SEE NOTE.   Diagnosis Note : These results were communicated to Rock Hover, RN at [facility] on 12/04/2022.The two invasive carcinomas with mucinous  features are morphologically similar.ER, PR, HER2, and Ki-67 will be performed  on block 2A and reported in an addendum.  This case underwent intradepartmental consultation and Dr. Reed concurs  with the interpretation.   ELECTRONIC SIGNATURE : Rubinas Md, Rexene , Sports administrator, International aid/development worker   MICROSCOPIC DESCRIPTION   CASE COMMENTS  STAINS USED IN DIAGNOSIS:  H&E-2  H&E-3  H&E-4  H&E  H&E-2  H&E-3  H&E-4  H&E  *RECUT 1 SLIDE  H&E-2  H&E-3  H&E-4  H&E  Stains used in diagnosis 2 Her2 by IHC, 2 ER-ACIS, 2 KI-67-ACIS, 2 PR-ACIS  IHC scores are reported using ASCO/CAP scoring criteria. An  IHC Score of 0 or  1+ is NEGATIVE for HER2, 3+ is POSITIVE for HER2, and 2+ is EQUIVOCAL.  Equivocal results are reflexed to either FISH or IHC testing. Specimens are  fixed in 10% Neutral Buffered Formalin for at least 6 hours and up to 72 hours.  These tests have not be validated on decalcified tissue. Results should be  interpreted with caution given the possibility of false negative results on  decalcified specimens. Antibody Clone for HER2 is 4B5 (PATHWAY). Some of these  immunohistochemical stains may have been developed and the performance  characteristics determined by [facility]. Some may not have been  cleared or approved by the U.S. Food and Drug Administration. The FDA has  determined that such clearance or approval is not necessary. This test is used  for clinical purposes. It should not be regarded as investigational or for  research. This laboratory is certified under the Clinical Laboratory  Improvement Amendments of 1988 (CLIA-88) as qualified to perform high complexity  clinical laboratory testing.  Estrogen receptor (6F11), immunohistochemical stains are performed on formalin  fixed, paraffin embedded tissue using a 3,3-diaminobenzidine (DAB) chromogen  and Leica Bond Autostainer System. The staining intensity of the nucleus is  scored manually and is reported as the percentage of tumor cell nuclei  demonstrating specific nuclear staining.Specimens are fixed in 10% Neutral  Buffered Formalin for at least 6 hours and up to 72 hours. These tests have not  be validated on decalcified tissue. Results should be interpreted with caution  given the possibility of false negative results on decalcified specimens.  Ki-67 (MM1), immunohistochemical stains are performed on formalin fixed,  paraffin embedded tissue using a 3,3-diaminobenzidine (DAB) chromogen and Leica  Bond Autostainer System. The staining intensity of the nucleus is scored  manually and is reported  as the percentage of tumor cell nuclei demonstrating  specific nuclear staining.Specimens are fixed in 10% Neutral Buffered Formalin  for at least 6 hours and up to 72 hours. These tests have not be validated on  decalcified tissue. Results should be interpreted with caution given the  possibility of false negative results on decalcified specimens.  PR progesterone receptor (16), immunohistochemical stains are performed on  formalin fixed, paraffin embedded tissue using a 3,3-diaminobenzidine (DAB)  chromogen and Leica Bond Autostainer System. The staining intensity of the  nucleus is scored manually and is reported as the percentage of tumor cell  nuclei demonstrating specific nuclear staining.Specimens are fixed in 10%  Neutral Buffered Formalin for at least 6 hours and up to 72 hours. These tests  have not be validated on decalcified tissue. Results should be interpreted with  caution given the possibility of false negative results on decalcified  specimens.   ADDENDUM  2) Breast, right, needle core biopsy, 2:00 5 cmfn ( coil clip)  PROGNOSTIC INDICATORS   Results:  IMMUNOHISTOCHEMICAL AND MORPHOMETRIC ANALYSIS PERFORMED MANUALLY  The tumor cells are NEGATIVE for Her2 (1+).  Estrogen Receptor: 2%, POSITIVE, STRONG STAINING INTENSITY  Progesterone Receptor: 95%, POSITIVE, STRONG STAINING INTENSITY  Proliferation Marker Ki67: 5%  REFERENCE RANGE ESTROGEN RECEPTOR  NEGATIVE 0%  POSITIVE =>1%  REFERENCE RANGE PROGESTERONE RECEPTOR  NEGATIVE 0%  POSITIVE =>1%  All controls stained appropriately  [place] Md, Fred , Sports administrator, International aid/development worker  ( Signed 06 [id number])   CLINICAL HISTORY   SPECIMEN(S) OBTAINED  1. Breast, right, needle core biopsy, Upper Outer Calcifications ( X Clip)  2. Breast, right, needle core biopsy, 2:00 5cmfn ( Coil Clip)  3. Breast, right, needle core biopsy, 2:00 10cmfn ( Ribbon Clip)   SPECIMEN COMMENTS:  1. TIF: 11:20am, CIT: < 5 min  2.  TIF: 11:45am, CIT: < 1 min  3. TIF: 11:50am, CIT: < 1 min  SPECIMEN CLINICAL INFORMATION:  1. 83 yo f with 0.8cm group of upper outer right breast calcifications   Gross Description  1. Received in formalin labeled with the patient's name and right breast calcs  are multiple fragments of fibroadipose tissue measuring 1.4 x 1.1 x 0.2 cm in  aggregate.The specimen is entirely submitted in one block.  Time in formalin 11:20 a.m., CIT less than 5 minutes.  2. Received in formalin labeled with the patient's name and right breast  2  o'clock 5 cm fn are three cores of fibroadipose tissue ranging from 1.4 to 1.6  cm in length, each measuring 0.1 cm in diameter.The specimen is entirely  submitted in one block  Time in formalin 11:45 a.m., CIT less than 1 minute.  3. Received in formalin labeled with the patient's name and right breast 2  o'clock 10 cm fn are five fragments to cores of fibroadipose tissue ranging  from 0.2 to 1.5 cm in length, each measuring 0.1 cm in diameter.The specimen is  entirely submitted in one block.  Time in formalin 11:50 a.m., CIT less than 1 minute. (KW:kh 12/04/23)   Assessment and Plan:   Diagnoses and all orders for this visit:  Malignant neoplasm of overlapping sites of right breast in female, estrogen receptor positive (CMS/HHS-HCC)   Reviewed surgical options today. Breast conserving surgery versus mastectomy instruction reviewed. Patient is opted for right breast lumpectomy. Discussed sentinel lymph node mapping pros and cons of this and radiation as well as with stage of disease. Complication of the procedure. She is opted for lumpectomy alone this point in time.The procedure has been discussed with the patient. Alternatives to surgery have been discussed with the patient. Risks of surgery include bleeding, Infection, Seroma formation, death, and the need for further surgery. The patient understands and wishes to proceed.    DEBBY CURTISTINE SHIPPER, MD

## 2024-01-15 NOTE — Anesthesia Procedure Notes (Signed)
 Procedure Name: LMA Insertion Date/Time: 01/15/2024 12:34 PM  Performed by: Arvell Edsel HERO, CRNAPre-anesthesia Checklist: Patient identified, Emergency Drugs available, Suction available and Patient being monitored Patient Re-evaluated:Patient Re-evaluated prior to induction Oxygen Delivery Method: Circle System Utilized Preoxygenation: Pre-oxygenation with 100% oxygen Induction Type: IV induction Ventilation: Mask ventilation without difficulty LMA: LMA inserted LMA Size: 4.0 Number of attempts: 1 Airway Equipment and Method: Bite block Placement Confirmation: positive ETCO2 and breath sounds checked- equal and bilateral Tube secured with: Tape Dental Injury: Teeth and Oropharynx as per pre-operative assessment

## 2024-01-15 NOTE — Discharge Instructions (Signed)
 [facility] Office Phone Number [phone]  BREAST BIOPSY/ PARTIAL MASTECTOMY: POST OP INSTRUCTIONS  Always review your discharge instruction sheet given to you by the facility where your surgery was performed.  IF YOU HAVE DISABILITY OR FAMILY LEAVE FORMS, YOU MUST BRING THEM TO THE OFFICE FOR PROCESSING.  DO NOT GIVE THEM TO YOUR DOCTOR.  A prescription for pain medication may be given to you upon discharge.  Take your pain medication as prescribed, if needed.  If narcotic pain medicine is not needed, then you may take acetaminophen  (Tylenol ) or ibuprofen (Advil) as needed. Take your usually prescribed medications unless otherwise directed If you need a refill on your pain medication, please contact your pharmacy.  They will contact our office to request authorization.  Prescriptions will not be filled after 5pm or on week-ends. You should eat very light the first 24 hours after surgery, such as soup, crackers, pudding, etc.  Resume your normal diet the day after surgery. Most patients will experience some swelling and bruising in the breast.  Ice packs and a good support bra will help.  Swelling and bruising can take several days to resolve.  It is common to experience some constipation if taking pain medication after surgery.  Increasing fluid intake and taking a stool softener will usually help or prevent this problem from occurring.  A mild laxative (Milk of Magnesia or Miralax ) should be taken according to package directions if there are no bowel movements after 48 hours. Unless discharge instructions indicate otherwise, you may remove your bandages 24-48 hours after surgery, and you may shower at that time.  You may have steri-strips (small skin tapes) in place directly over the incision.  These strips should be left on the skin for 7-10 days.  If your surgeon used skin glue on the incision, you may shower in 24 hours.  The glue will flake off over the next 2-3 weeks.  Any  sutures or staples will be removed at the office during your follow-up visit. ACTIVITIES:  You may resume regular daily activities (gradually increasing) beginning the next day.  Wearing a good support bra or sports bra minimizes pain and swelling.  You may have sexual intercourse when it is comfortable. You may drive when you no longer are taking prescription pain medication, you can comfortably wear a seatbelt, and you can safely maneuver your car and apply brakes. RETURN TO WORK:  ______________________________________________________________________________________ [name] should see your doctor in the office for a follow-up appointment approximately two weeks after your surgery.  Your doctor's nurse will typically make your follow-up appointment when she calls you with your pathology report.  Expect your pathology report 2-3 business days after your surgery.  You may call to check if you do not hear from us  after three days. OTHER INSTRUCTIONS: _______________________________________________________________________________________________ _____________________________________________________________________________________________________________________________________ _____________________________________________________________________________________________________________________________________ _____________________________________________________________________________________________________________________________________  WHEN TO CALL YOUR DOCTOR: Fever over 101.0 Nausea and/or vomiting. Extreme swelling or bruising. Continued bleeding from incision. Increased pain, redness, or drainage from the incision.  The clinic staff is available to answer your questions during regular business hours.  Please don't hesitate to call and ask to speak to one of the nurses for clinical concerns.  If you have a medical emergency, go to the nearest emergency room or call 911.  A surgeon from [facility] is always on call at the hospital.  For further questions, please visit [email or web address]        RESTART BLOOD THINNING MEDICATION ON SATURDAY

## 2024-01-15 NOTE — Interval H&P Note (Signed)
 History and Physical Interval Note:  01/15/2024 11:31 AM  [name]  has presented today for surgery, with the diagnosis of RIGHT BREAST CANCER.  The various methods of treatment have been discussed with the patient and family. After consideration of risks, benefits and other options for treatment, the patient has consented to  Procedure(s) with comments: BREAST LUMPECTOMY WITH RADIOACTIVE SEED LOCALIZATION (Right) - RIGHT BREAST SEED LUMPECTOMY BRACKETED as a surgical intervention.  The patient's history has been reviewed, patient examined, no change in status, stable for surgery.  I have reviewed the patient's chart and labs.  Questions were answered to the patient's satisfaction.     Undine Nealis A Babetta Paterson

## 2024-01-15 NOTE — Op Note (Signed)
 Preoperative diagnosis: Stage I multifocal right breast cancer overlapping sites ER positive  Postoperative diagnosis: Same  Procedure: Right breast seed localized lumpectomy x 2 utilizing 3 localizing seeds  Surgeon: Debby Shipper, MD  Anesthesia: LMA with 0.25% Marcaine  with epinephrine   EBL: 20 cc  Specimen: Medial right breast contains 2 lesions 2 clips with 2 seeds.  An additional margin was shaved from the specimen and submitted separately.  The lateral breast mass was excised separately through a separate incision is labeled right lateral breast with seed and clip verified by Faxitron  Assistant: Dr Britta MD   I was personally present during the key and critical portions of this procedure and immediately available throughout the entire procedure, as documented in my operative note.    Indications for procedure: The patient is an 83 year old female with multifocal right breast cancer.  She has a lateral focus of atypical ductal hyperplasia as well.  She presents today for right breast lumpectomy to excise all 3 areas.  She had 3 seeds placed as an outpatient.  Risks and benefits of surgery reviewed as well as potential complications.  She was seen in the [facility] and opted for breast conserving surgery after reviewing all of her treatment options.The procedure has been discussed with the patient. Alternatives to surgery have been discussed with the patient.  Risks of surgery include bleeding,  Infection,  Seroma formation, cosmetic deformity,  death,  and the need for further surgery.   The patient understands and wishes to proceed.      Description of procedure: The patient was met in the holding area and questions were answered.  The right side was marked as the correct side and films were available for review.  She had 3 seeds placed.  She was taken back to the op room.  She was placed supine upon the OR table.  After induction of general anesthesia, the right breast was prepped and  draped in a sterile fashion and timeout performed.  Proper patient, site and procedure verified and films were available for review.  The seeds were sufficiently separated from each other with the neoprobe evaluation that 2 incisions were recommended.  The medial 2 lesions were identified with the neoprobe.  Local anesthetic was infiltrated and a transverse incision was made over the medial lesions.  Dissection was carried down all tissue around both seeds and both clips were excised with grossly negative margins.  The imaging was reviewed and I felt the medial lesion had an area that was more superior and anterior and medial.  There is no more anterior tissue to excise or excise more of the superior medial part of this area as an additional margin to the specimen.  Hemostasis was achieved with cautery.  Arista was used as well due to some slow easy oozing.  Clips were placed.  The lateral lesion was identified at the neoprobe.  Local anesthetic was infiltrated.  A curvilinear incision was made over the separately.  Dissection was carried down all tissue around the seed and clip were excised with grossly negative margins.  Imaging revealed both the seed and clip to be present.  Both cavities were examined and found to be hemostatic.  Irrigation was used.  Clips were placed to mark the cavity.  Local anesthetic was infiltrated throughout.  Both incisions closed combination of 3-0 Vicryl and 4 Monocryl.  Dermabond applied.  Breast binder placed.  All counts found to be correct.  The patient was then awoke extubated and taken to  recovery in satisfactory condition.

## 2024-01-16 ENCOUNTER — Encounter (HOSPITAL_COMMUNITY): Payer: Self-pay | Admitting: Surgery

## 2024-01-16 NOTE — Anesthesia Postprocedure Evaluation (Signed)
 Anesthesia Post Note  Patient: [name]  Procedure(s) Performed: BREAST LUMPECTOMY WITH RADIOACTIVE SEED LOCALIZATION (Right: Breast)     Patient location during evaluation: PACU Anesthesia Type: General Level of consciousness: awake and alert Pain management: pain level controlled Vital Signs Assessment: post-procedure vital signs reviewed and stable Respiratory status: spontaneous breathing, nonlabored ventilation, respiratory function stable and patient connected to nasal cannula oxygen Cardiovascular status: blood pressure returned to baseline and stable Postop Assessment: no apparent nausea or vomiting Anesthetic complications: no   There were no known notable events for this encounter.  Last Vitals:  Vitals:   01/15/24 1500 01/15/24 1515  BP: (!) 115/54 (!) 118/56  Pulse: 62 63  Resp: 18 13  Temp:  36.5 C  SpO2: 94% 95%    Last Pain:  Vitals:   01/15/24 1448  TempSrc:   PainSc: 6                  Garnette DELENA Gab

## 2024-01-17 ENCOUNTER — Other Ambulatory Visit: Payer: Self-pay | Admitting: Family Medicine

## 2024-01-17 DIAGNOSIS — Z889 Allergy status to unspecified drugs, medicaments and biological substances status: Secondary | ICD-10-CM

## 2024-01-19 ENCOUNTER — Other Ambulatory Visit (HOSPITAL_BASED_OUTPATIENT_CLINIC_OR_DEPARTMENT_OTHER): Payer: Self-pay

## 2024-01-19 MED ORDER — LORATADINE 10 MG PO TABS
10.0000 mg | ORAL_TABLET | Freq: Every day | ORAL | 1 refills | Status: AC | PRN
  Filled 2024-01-19: qty 100, 100d supply, fill #0
  Filled 2024-05-09: qty 100, 100d supply, fill #1

## 2024-01-20 ENCOUNTER — Other Ambulatory Visit (HOSPITAL_BASED_OUTPATIENT_CLINIC_OR_DEPARTMENT_OTHER): Payer: Self-pay

## 2024-01-20 ENCOUNTER — Telehealth: Payer: Self-pay | Admitting: *Deleted

## 2024-01-20 NOTE — Telephone Encounter (Signed)
 Requested repeat prognostics are surgical case from pathology

## 2024-01-23 ENCOUNTER — Other Ambulatory Visit: Payer: Self-pay | Admitting: Internal Medicine

## 2024-01-23 ENCOUNTER — Encounter: Payer: Self-pay | Admitting: *Deleted

## 2024-01-25 ENCOUNTER — Other Ambulatory Visit: Payer: Self-pay | Admitting: Family Medicine

## 2024-01-25 DIAGNOSIS — M545 Low back pain, unspecified: Secondary | ICD-10-CM

## 2024-01-25 DIAGNOSIS — M546 Pain in thoracic spine: Secondary | ICD-10-CM

## 2024-01-26 ENCOUNTER — Other Ambulatory Visit (HOSPITAL_BASED_OUTPATIENT_CLINIC_OR_DEPARTMENT_OTHER): Payer: Self-pay

## 2024-01-26 ENCOUNTER — Ambulatory Visit: Payer: Self-pay | Admitting: Surgery

## 2024-01-26 MED ORDER — TRAMADOL HCL 50 MG PO TABS
50.0000 mg | ORAL_TABLET | Freq: Four times a day (QID) | ORAL | 0 refills | Status: DC | PRN
  Filled 2024-01-26: qty 60, 8d supply, fill #0

## 2024-01-26 NOTE — Telephone Encounter (Signed)
 Requesting: tramadol  50mg   Contract: None UDS: None Last Visit: 11/11/23 Next Visit: None Last Refill: 12/08/23 #60 and 9mq   Please Advise

## 2024-01-28 ENCOUNTER — Inpatient Hospital Stay: Admitting: Hematology and Oncology

## 2024-01-29 ENCOUNTER — Encounter: Payer: Self-pay | Admitting: *Deleted

## 2024-02-02 ENCOUNTER — Telehealth: Payer: Self-pay | Admitting: Radiation Oncology

## 2024-02-02 NOTE — Telephone Encounter (Signed)
 Received VM from pt regarding questions about upcoming tx. Returned call, pt asking about potential start date to provide to her sister who will be aiding her throughout treatment. Sent inbasket to Dr. Izell and PA Wyatt to provide estimated date for pt. Pt aware provider currently out of office until 8/11 may cause delay in response.

## 2024-02-03 ENCOUNTER — Telehealth: Payer: Self-pay | Admitting: Radiation Oncology

## 2024-02-03 ENCOUNTER — Other Ambulatory Visit: Payer: Self-pay

## 2024-02-03 ENCOUNTER — Encounter (HOSPITAL_COMMUNITY): Payer: Self-pay | Admitting: Surgery

## 2024-02-03 ENCOUNTER — Encounter: Payer: Self-pay | Admitting: Cardiology

## 2024-02-03 NOTE — Progress Notes (Signed)
 PERIOPERATIVE PRESCRIPTION FOR IMPLANTED CARDIAC DEVICE PROGRAMMING  Patient Information: Name:  [name]  DOB:  14-Jul-1940  MRN:  [id number]    Planned Procedure:  Re-excision right breast lumpectomy  Surgeon:  Dr. Debby Shipper  Date of Procedure:  02/05/24  Cautery will be used.  Position during surgery:  supine   Please send documentation back to:  Jol[facility] Pack (Fax # [phone])  Device Information:  Clinic EP Physician:  Soyla Norton, MD   Device Type:  Pacemaker Manufacturer and Phone #:  Medtronic: [phone] Pacemaker Dependent?:  Yes.   Date of Last Device Check:  12/23/2023  Normal Device Function?:  Yes.    Electrophysiologist's Recommendations:  Have magnet available. Provide continuous ECG monitoring when magnet is used or reprogramming is to be performed.  Procedure will likely interfere with device function.  Device should be programmed:  Asynchronous pacing during procedure and returned to normal programming after procedure  Per Device Clinic Standing Orders, Daisy ONEIDA Shutter, RN  11:46 AM 02/03/2024

## 2024-02-03 NOTE — Telephone Encounter (Signed)
 Returned pt's call to advise potential start date of 9/10 per PA Muir via inbasket. Pt was very grateful for assistance and advised she would let her sister know.

## 2024-02-03 NOTE — Progress Notes (Signed)
 Electrophysiologist's recommendations sent to [facility] via email.

## 2024-02-03 NOTE — Progress Notes (Signed)
 [name] CALL  Patient was given pre-op instructions over the phone. The opportunity was given for the patient to ask questions. No further questions asked. Patient verbalized understanding of instructions given.   PCP - Jamee Shanks [place] Cardiologist - Daphne Barrack, NP EP - Dr. 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 Patient agrees to go over the instructions while at home for a better understanding.

## 2024-02-04 NOTE — Progress Notes (Signed)
 Anesthesia Chart Review: Same day workup  83 year old female follows with cardiology for history of CHB s/p PPM, paroxysmal A-fib on Eliquis , NSVT, HTN.  Nuclear stress 08/2020 was low risk.  Echo 07/2021 showed EF 60 to 65%, normal wall motion, grade 2 DD, normal RV, mildly elevated PASP, mild to moderate mitral regurgitation.  16th I do not think patient's Medtronic PPM reached ERI 11/18/2023 and she had generator change out with dual-chamber PPM placed 12/09/2023 by Dr. Waddell.  Preop clearance per note by Daphne Barrack, NP on 12/08/2023 (clearance added an addendum on 12/29/2023), [name]'s perioperative risk of a major cardiac event is 0.4% according to the Revised Cardiac Risk Index (RCRI).  Therefore, she is at low risk for perioperative complications. Recommendations: According to ACC/AHA guidelines, no further cardiovascular testing needed.  The patient may proceed to surgery at acceptable risk.  Antiplatelet and/or Anticoagulation Recommendations: Eliquis  (Apixaban ) can be held for 2 days prior to surgery.  Please resume post op when felt to be safe.   Patient reports last dose of Eliquis  01/12/2024.   Other pertinent history includes PONV, GERD, hiatal hernia, renal cancer s/p left nephrectomy, CKD 3, hypothyroidism s/p thyroidectomy.   BMP and CBC from 01/09/24 reviewed, unremarkable.   EKG 12/08/23: Ventricular paced rhythm.  Rate 55.   Perioperative prescription for plan to cardiac device programming per progress note 02/03/24: Device Information:   Clinic EP Physician:  Soyla Norton, MD    Device Type:  Pacemaker Manufacturer and Phone #:  Medtronic: [phone] Pacemaker Dependent?:  Yes.   Date of Last Device Check:  12/23/2023      Normal Device Function?:  Yes.     Electrophysiologist's Recommendations:   Have magnet available. Provide continuous ECG monitoring when magnet is used or reprogramming is to be performed.  Procedure will likely interfere with device function.   Device should be programmed:  Asynchronous pacing during procedure and returned to normal programming after procedure   TTE 07/18/2021: 1. Sigmoid septum noted without significant LVOT gradient. Left  ventricular ejection fraction, by estimation, is 60 to 65%. The left  ventricle has normal function. The left ventricle has no regional wall  motion abnormalities. There is mild left  ventricular hypertrophy. Left ventricular diastolic parameters are  consistent with Grade II diastolic dysfunction (pseudonormalization).   2. Right ventricular systolic function is normal. The right ventricular  size is normal. There is mildly elevated pulmonary artery systolic  pressure.   3. Left atrial size was mildly dilated.   4. Right atrial size was mildly dilated.   5. The mitral valve is normal in structure. Mild to moderate mitral valve  regurgitation. No evidence of mitral stenosis.   6. The aortic valve is normal in structure. Aortic valve regurgitation is  not visualized. No aortic stenosis is present.   7. The inferior vena cava is normal in size with greater than 50%  respiratory variability, suggesting right atrial pressure of 3 mmHg.    Nuclear stress 09/16/2020: IMPRESSION: 1. Matched reduced activity in the inferior wall is somewhat large, but probably from diaphragmatic attenuation rather than scarring given the reasonable wall motion in this vicinity.   2. Mild apical hypokinesis.   3. Left ventricular ejection fraction 65%   4. Non invasive risk stratification*: Low    Lynwood Geofm RIGGERS [facility]/Anesthesiology Phone [phone] 02/04/2024 9:56 AM

## 2024-02-04 NOTE — Anesthesia Preprocedure Evaluation (Addendum)
 Anesthesia Evaluation  Patient identified by MRN, date of birth, ID band Patient awake    Reviewed: Allergy & Precautions, NPO status , Patient's Chart, lab work & pertinent test results  History of Anesthesia Complications (+) PONV and history of anesthetic complications  Airway Mallampati: II  TM Distance: >3 FB Neck ROM: Full    Dental no notable dental hx.    Pulmonary sleep apnea , former smoker   Pulmonary exam normal        Cardiovascular hypertension, Pt. 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)   TTE 2023: Sigmoid septum noted without significant LVOT gradient, EF 60-65%, mild LVH, grade II DD, mildly elevated PASP, mild LAE/RAE, mild to moderate MR      Neuro/Psych  Headaches   Depression       GI/Hepatic Neg liver ROS,GERD  Controlled,,  Endo/Other  Hypothyroidism    Renal/GU negative Renal ROS  negative genitourinary   Musculoskeletal negative musculoskeletal ROS (+)    Abdominal   Peds  Hematology negative hematology ROS (+)   Anesthesia Other Findings Right breast ca  Reproductive/Obstetrics negative OB ROS                              Anesthesia Physical Anesthesia Plan  ASA: 3  Anesthesia Plan: General   Post-op Pain Management: Tylenol  PO (pre-op)*   Induction: Intravenous  PONV Risk Score and Plan: 4 or greater and Treatment may vary due to age or medical condition, Propofol  infusion, Dexamethasone , Ondansetron  and TIVA  Airway Management Planned: LMA  Additional Equipment: None  Intra-op Plan:   Post-operative Plan: Extubation in OR  Informed Consent: I have reviewed the patients History and Physical, chart, labs and discussed the procedure including the risks, benefits and alternatives for the proposed anesthesia with the patient or authorized  representative who has indicated his/her understanding and acceptance.     Dental advisory given  Plan Discussed with: CRNA  Anesthesia Plan Comments: (PAT note by Lynwood Hope, PA-C:  83 year old female follows with cardiology for history of CHB s/p PPM, paroxysmal A-fib on Eliquis , NSVT, HTN.  Nuclear stress 08/2020 was low risk.  Echo 07/2021 showed EF 60 to 65%, normal wall motion, grade 2 DD, normal RV, mildly elevated PASP, mild to moderate mitral regurgitation.  16th I do not think patient's Medtronic PPM reached ERI 11/18/2023 and she had generator change out with dual-chamber PPM placed 12/09/2023 by Dr. Waddell.  Preop clearance per note by Daphne Barrack, NP on 12/08/2023 (clearance added an addendum on 12/29/2023), Ms. Lenker's perioperative risk of a major cardiac event is 0.4% according to the Revised Cardiac Risk Index (RCRI).  Therefore, she is at low risk for perioperative complications. Recommendations: According to ACC/AHA guidelines, no further cardiovascular testing needed.  The patient may proceed to surgery at acceptable risk.  Antiplatelet and/or Anticoagulation Recommendations: Eliquis  (Apixaban ) can be held for 2 days prior to surgery.  Please resume post op when felt to be safe.   Patient reports last dose of Eliquis  01/12/2024.   Other pertinent history includes PONV, GERD, hiatal hernia, renal cancer s/p left nephrectomy, CKD 3, hypothyroidism s/p thyroidectomy.   BMP and CBC from 01/09/24 reviewed, unremarkable.   EKG 12/08/23: Ventricular paced rhythm.  Rate 55.   Perioperative prescription for plan to cardiac device programming per progress note 02/03/24: Device Information:   Clinic EP Physician:  Will  Inocencio, MD    Device Type:  Pacemaker Manufacturer and Phone #:  Medtronic: [phone] Pacemaker Dependent?:  Yes.   Date of Last Device Check:  12/23/2023      Normal Device Function?:  Yes.     Electrophysiologist's Recommendations:    Have magnet  available.  Provide continuous ECG monitoring when magnet is used or reprogramming is to be performed.   Procedure will likely interfere with device function.  Device should be programmed:  Asynchronous pacing during procedure and returned to normal programming after procedure   TTE 07/18/2021: 1. Sigmoid septum noted without significant LVOT gradient. Left  ventricular ejection fraction, by estimation, is 60 to 65%. The left  ventricle has normal function. The left ventricle has no regional wall  motion abnormalities. There is mild left  ventricular hypertrophy. Left ventricular diastolic parameters are  consistent with Grade II diastolic dysfunction (pseudonormalization).   2. Right ventricular systolic function is normal. The right ventricular  size is normal. There is mildly elevated pulmonary artery systolic  pressure.   3. Left atrial size was mildly dilated.   4. Right atrial size was mildly dilated.   5. The mitral valve is normal in structure. Mild to moderate mitral valve  regurgitation. No evidence of mitral stenosis.   6. The aortic valve is normal in structure. Aortic valve regurgitation is  not visualized. No aortic stenosis is present.   7. The inferior vena cava is normal in size with greater than 50%  respiratory variability, suggesting right atrial pressure of 3 mmHg.    Nuclear stress 09/16/2020: IMPRESSION: 1. Matched reduced activity in the inferior wall is somewhat large, but probably from diaphragmatic attenuation rather than scarring given the reasonable wall motion in this vicinity.   2. Mild apical hypokinesis.   3. Left ventricular ejection fraction 65%   4. Non invasive risk stratification*: Low   )         Anesthesia Quick Evaluation

## 2024-02-05 ENCOUNTER — Other Ambulatory Visit: Payer: Self-pay

## 2024-02-05 ENCOUNTER — Encounter (HOSPITAL_COMMUNITY)
Admission: RE | Disposition: A | Discharge status: Home / Self Care | Payer: Self-pay | Source: Home / Self Care | Attending: Surgery

## 2024-02-05 ENCOUNTER — Other Ambulatory Visit (HOSPITAL_BASED_OUTPATIENT_CLINIC_OR_DEPARTMENT_OTHER): Payer: Self-pay

## 2024-02-05 ENCOUNTER — Ambulatory Visit (HOSPITAL_COMMUNITY)
Admission: RE | Admit: 2024-02-05 | Discharge: 2024-02-05 | Disposition: A | Discharge status: Home / Self Care | Attending: Surgery | Admitting: Surgery

## 2024-02-05 ENCOUNTER — Ambulatory Visit (HOSPITAL_COMMUNITY): Payer: Self-pay | Admitting: Physician Assistant

## 2024-02-05 ENCOUNTER — Encounter (HOSPITAL_COMMUNITY): Payer: Self-pay | Admitting: Surgery

## 2024-02-05 DIAGNOSIS — I10 Essential (primary) hypertension: Secondary | ICD-10-CM | POA: Diagnosis not present

## 2024-02-05 DIAGNOSIS — E039 Hypothyroidism, unspecified: Secondary | ICD-10-CM | POA: Insufficient documentation

## 2024-02-05 DIAGNOSIS — Z87891 Personal history of nicotine dependence: Secondary | ICD-10-CM | POA: Insufficient documentation

## 2024-02-05 DIAGNOSIS — I4891 Unspecified atrial fibrillation: Secondary | ICD-10-CM | POA: Diagnosis not present

## 2024-02-05 DIAGNOSIS — Z950 Presence of cardiac pacemaker: Secondary | ICD-10-CM | POA: Insufficient documentation

## 2024-02-05 DIAGNOSIS — G4730 Sleep apnea, unspecified: Secondary | ICD-10-CM | POA: Diagnosis not present

## 2024-02-05 DIAGNOSIS — Z85528 Personal history of other malignant neoplasm of kidney: Secondary | ICD-10-CM | POA: Insufficient documentation

## 2024-02-05 DIAGNOSIS — I480 Paroxysmal atrial fibrillation: Secondary | ICD-10-CM | POA: Diagnosis not present

## 2024-02-05 DIAGNOSIS — C50211 Malignant neoplasm of upper-inner quadrant of right female breast: Secondary | ICD-10-CM

## 2024-02-05 DIAGNOSIS — K219 Gastro-esophageal reflux disease without esophagitis: Secondary | ICD-10-CM | POA: Diagnosis not present

## 2024-02-05 HISTORY — PX: RE-EXCISION OF BREAST LUMPECTOMY: SHX6048

## 2024-02-05 SURGERY — EXCISION, LESION, BREAST
Anesthesia: General | Site: Breast | Laterality: Right

## 2024-02-05 MED ORDER — FENTANYL CITRATE (PF) 100 MCG/2ML IJ SOLN: 25.0000 ug | INTRAMUSCULAR | Status: DC | PRN

## 2024-02-05 MED ORDER — OXYCODONE HCL 5 MG PO TABS
5.0000 mg | ORAL_TABLET | Freq: Once | ORAL | Status: AC | PRN
  Administered 2024-02-05: 5 mg via ORAL

## 2024-02-05 MED ORDER — LACTATED RINGERS IV SOLN: INTRAVENOUS | Status: DC

## 2024-02-05 MED ORDER — ACETAMINOPHEN 500 MG PO TABS
1000.0000 mg | ORAL_TABLET | Freq: Once | ORAL | Status: AC
  Administered 2024-02-05: 1000 mg via ORAL
  Filled 2024-02-05: qty 2

## 2024-02-05 MED ORDER — BUPIVACAINE-EPINEPHRINE (PF) 0.25% -1:200000 IJ SOLN
INTRAMUSCULAR | Status: AC
  Filled 2024-02-05: qty 30

## 2024-02-05 MED ORDER — CHLORHEXIDINE GLUCONATE CLOTH 2 % EX PADS: 6.0000 | MEDICATED_PAD | Freq: Once | CUTANEOUS | Status: DC

## 2024-02-05 MED ORDER — ORAL CARE MOUTH RINSE: 15.0000 mL | Freq: Once | OROMUCOSAL | Status: AC

## 2024-02-05 MED ORDER — FENTANYL CITRATE (PF) 250 MCG/5ML IJ SOLN
INTRAMUSCULAR | Status: AC
  Filled 2024-02-05: qty 5

## 2024-02-05 MED ORDER — PHENYLEPHRINE HCL-NACL 20-0.9 MG/250ML-% IV SOLN
INTRAVENOUS | Status: DC | PRN
Start: 2024-02-05 — End: 2024-02-05
  Administered 2024-02-05: 25 ug/min via INTRAVENOUS

## 2024-02-05 MED ORDER — OXYCODONE HCL 5 MG PO TABS
5.0000 mg | ORAL_TABLET | Freq: Four times a day (QID) | ORAL | 0 refills | Status: DC | PRN
  Filled 2024-02-05: qty 15, 4d supply, fill #0

## 2024-02-05 MED ORDER — 0.9 % SODIUM CHLORIDE (POUR BTL) OPTIME
TOPICAL | Status: DC | PRN
  Administered 2024-02-05: 1000 mL

## 2024-02-05 MED ORDER — DEXAMETHASONE SODIUM PHOSPHATE 10 MG/ML IJ SOLN
INTRAMUSCULAR | Status: AC
  Filled 2024-02-05: qty 1

## 2024-02-05 MED ORDER — FENTANYL CITRATE (PF) 250 MCG/5ML IJ SOLN
INTRAMUSCULAR | Status: DC | PRN
  Administered 2024-02-05: 50 ug via INTRAVENOUS

## 2024-02-05 MED ORDER — ONDANSETRON HCL 4 MG/2ML IJ SOLN
INTRAMUSCULAR | Status: DC | PRN
  Administered 2024-02-05: 4 mg via INTRAVENOUS

## 2024-02-05 MED ORDER — PROPOFOL 10 MG/ML IV BOLUS
INTRAVENOUS | Status: DC | PRN
  Administered 2024-02-05: 90 mg via INTRAVENOUS

## 2024-02-05 MED ORDER — DROPERIDOL 2.5 MG/ML IJ SOLN: 0.6250 mg | Freq: Once | INTRAMUSCULAR | Status: DC | PRN

## 2024-02-05 MED ORDER — ONDANSETRON HCL 4 MG/2ML IJ SOLN
INTRAMUSCULAR | Status: AC
  Filled 2024-02-05: qty 2

## 2024-02-05 MED ORDER — CEFAZOLIN SODIUM-DEXTROSE 2-3 GM-%(50ML) IV SOLR
INTRAVENOUS | Status: DC | PRN
  Administered 2024-02-05: 2 g via INTRAVENOUS

## 2024-02-05 MED ORDER — PROPOFOL 500 MG/50ML IV EMUL
INTRAVENOUS | Status: DC | PRN
  Administered 2024-02-05: 150 ug/kg/min via INTRAVENOUS
  Administered 2024-02-05: 30 mg via INTRAVENOUS

## 2024-02-05 MED ORDER — OXYCODONE HCL 5 MG PO TABS
ORAL_TABLET | ORAL | Status: AC
  Filled 2024-02-05: qty 1

## 2024-02-05 MED ORDER — CHLORHEXIDINE GLUCONATE 0.12 % MT SOLN
15.0000 mL | Freq: Once | OROMUCOSAL | Status: AC
  Administered 2024-02-05: 15 mL via OROMUCOSAL
  Filled 2024-02-05: qty 15

## 2024-02-05 MED ORDER — BUPIVACAINE-EPINEPHRINE 0.25% -1:200000 IJ SOLN
INTRAMUSCULAR | Status: DC | PRN
  Administered 2024-02-05: 20 mL

## 2024-02-05 MED ORDER — LIDOCAINE 2% (20 MG/ML) 5 ML SYRINGE
INTRAMUSCULAR | Status: DC | PRN
  Administered 2024-02-05: 60 mg via INTRAVENOUS

## 2024-02-05 MED ORDER — CEFAZOLIN SODIUM-DEXTROSE 3-4 GM/150ML-% IV SOLN
3.0000 g | INTRAVENOUS | Status: DC
  Filled 2024-02-05: qty 150

## 2024-02-05 MED ORDER — DEXAMETHASONE SODIUM PHOSPHATE 10 MG/ML IJ SOLN
INTRAMUSCULAR | Status: DC | PRN
  Administered 2024-02-05: 5 mg via INTRAVENOUS

## 2024-02-05 MED ORDER — OXYCODONE HCL 5 MG/5ML PO SOLN: 5.0000 mg | Freq: Once | ORAL | Status: AC | PRN

## 2024-02-05 MED ORDER — PROPOFOL 10 MG/ML IV BOLUS
INTRAVENOUS | Status: AC
  Filled 2024-02-05: qty 20

## 2024-02-05 SURGICAL SUPPLY — 30 items
BAG COUNTER SPONGE SURGICOUNT (BAG) ×1 IMPLANT
BINDER BREAST LRG (GAUZE/BANDAGES/DRESSINGS) IMPLANT
BINDER BREAST XLRG (GAUZE/BANDAGES/DRESSINGS) IMPLANT
CANISTER SUCTION 3000ML PPV (SUCTIONS) IMPLANT
CHLORAPREP W/TINT 26 (MISCELLANEOUS) ×1 IMPLANT
CLIP APPLIE 9.375 MED OPEN (MISCELLANEOUS) IMPLANT
COVER PROBE W GEL 5X96 (DRAPES) ×1 IMPLANT
COVER SURGICAL LIGHT HANDLE (MISCELLANEOUS) ×1 IMPLANT
DERMABOND ADVANCED .7 DNX12 (GAUZE/BANDAGES/DRESSINGS) ×1 IMPLANT
DEVICE DUBIN SPECIMEN MAMMOGRA (MISCELLANEOUS) ×1 IMPLANT
DRAPE CHEST BREAST 15X10 FENES (DRAPES) ×1 IMPLANT
ELECT CAUTERY BLADE 6.4 (BLADE) ×1 IMPLANT
ELECTRODE REM PT RTRN 9FT ADLT (ELECTROSURGICAL) ×1 IMPLANT
GAUZE PAD ABD 8X10 STRL (GAUZE/BANDAGES/DRESSINGS) ×1 IMPLANT
GLOVE BIOGEL PI IND STRL 8 (GLOVE) ×1 IMPLANT
GOWN STRL REUS W/ TWL LRG LVL3 (GOWN DISPOSABLE) ×1 IMPLANT
GOWN STRL REUS W/ TWL XL LVL3 (GOWN DISPOSABLE) ×1 IMPLANT
KIT BASIN OR (CUSTOM PROCEDURE TRAY) ×1 IMPLANT
KIT MARKER MARGIN INK (KITS) ×1 IMPLANT
LIGHT WAVEGUIDE WIDE FLAT (MISCELLANEOUS) IMPLANT
NDL HYPO 25GX1X1/2 BEV (NEEDLE) ×1 IMPLANT
NEEDLE HYPO 25GX1X1/2 BEV (NEEDLE) ×1 IMPLANT
NS IRRIG 1000ML POUR BTL (IV SOLUTION) IMPLANT
PACK GENERAL/GYN (CUSTOM PROCEDURE TRAY) ×1 IMPLANT
SUT MNCRL AB 4-0 PS2 18 (SUTURE) ×1 IMPLANT
SUT SILK 2 0 SH (SUTURE) IMPLANT
SUT VIC AB 2-0 SH 27XBRD (SUTURE) IMPLANT
SUT VIC AB 3-0 SH 8-18 (SUTURE) ×1 IMPLANT
SYR CONTROL 10ML LL (SYRINGE) ×1 IMPLANT
TOWEL GREEN STERILE FF (TOWEL DISPOSABLE) IMPLANT

## 2024-02-05 NOTE — Op Note (Signed)
 Preoperative diagnosis: Right breast cancer stage I upper inner quadrant  Postoperative diagnosis: Same  Procedure: Reexcision right breast lumpectomy  Surgeon: Debby Shipper, MD  Anesthesia: LMA with 0.25% Marcaine  with epinephrine   EBL: Minimal  Specimen: lumpectomy site anterior margin inferior margin sent to pathology  Indications for procedure: The patient is an 83 year old female who had a lumpectomy.  Her anterior inferior margins were positive and she returns today for reexcision.The procedure has been discussed with the patient. Alternatives to surgery have been discussed with the patient.  Risks of surgery include bleeding,  Infection,  Seroma formation, death,  and the need for further surgery.   The patient understands and wishes to proceed.    Description of procedure: The patient was met in the holding area and questions were answered.  She was taken back to the operating room placed supine upon the operative room table.  After induction of general anesthesia, the right breast was prepped and draped in sterile fashion timeout performed.  The lumpectomy incision was opened.  The seroma was evacuated.  The anterior and inferior margins were excised.  These appear grossly negative now.  These were oriented with a consent to pathology.  The deep tissue planes were irrigated.  There was good hemostasis.  The previous clips were noted from the lumpectomy.  We then closed the wound with 3-0 Vicryl for Monocryl.  Dermabond applied.  Breast binder placed.  All counts found to be correct.  The patient was awoke extubated taken to recovery in satisfactory condition.

## 2024-02-05 NOTE — Discharge Instructions (Signed)

## 2024-02-05 NOTE — H&P (Signed)
 [name] is an 83 y.o. female.   Chief Complaint: Right breast cancer HPI: Patient presents for reexcision right breast lumpectomy.  Past Medical History:  Diagnosis Date   Allergy    Atrioventricular block, complete (HCC)    a. 08/2011 Upgrade of PPM to MDT Adapta L Dual Chamber PPM ser # [id number].   Breast cancer (HCC)    Cardiac pacemaker in situ    Contact dermatitis and eczema    unspec cause   Dairy product intolerance    Depression    Diastolic dysfunction    GERD (gastroesophageal reflux disease)    Hiatal hernia    History of diverticulitis of colon    2003  perforated diverticulitis w/ surgical intervention   History of kidney stones    age 35   History of peripheral edema    lower extremities   History of thyroid  nodule    multinodular goiter  s/p  total thyroidectomy 2014   HTN (hypertension)    Hydronephrosis, left    Hypothyroidism    Mild sleep apnea    per study 04-14-2014   Nocturia    NSVT (nonsustained ventricular tachycardia) (HCC)    Osteoporosis    PAF (paroxysmal atrial fibrillation) (HCC)    PONV (postoperative nausea and vomiting)    Presence of permanent cardiac pacemaker    Pulmonary nodules    stable per last ct   Renal cell carcinoma of left kidney (HCC)    Superficial thrombophlebitis     Past Surgical History:  Procedure Laterality Date   BREAST BIOPSY Right 12/03/2023   MM RT BREAST BX W LOC DEV 1ST LESION IMAGE BX SPEC STEREO GUIDE 12/03/2023 [facility]   BREAST BIOPSY Right 12/03/2023   US  RT BREAST BX W LOC DEV EA ADD LESION IMG BX SPEC US  GUIDE 12/03/2023 [facility]   BREAST BIOPSY Right 12/03/2023   US  RT BREAST BX W LOC DEV 1ST LESION IMG BX SPEC US  GUIDE 12/03/2023 [facility]   BREAST BIOPSY  01/13/2024   MM RT RADIOACTIVE SEED LOC MAMMO GUIDE 01/13/2024 [facility]   BREAST BIOPSY  01/13/2024   MM RT RADIOACTIVE SEED EA ADD LESION LOC MAMMO GUIDE 01/13/2024 [facility]   BREAST BIOPSY   01/13/2024   MM RT RADIOACTIVE SEED EA ADD LESION LOC MAMMO GUIDE 01/13/2024 [facility]   BREAST LUMPECTOMY WITH RADIOACTIVE SEED LOCALIZATION Right 01/15/2024   Procedure: BREAST LUMPECTOMY WITH RADIOACTIVE SEED LOCALIZATION;  Surgeon: Vanderbilt Ned, MD;  Location: [facility];  Service: General;  Laterality: Right;  RIGHT BREAST SEED LUMPECTOMY BRACKETED   CARDIOVASCULAR STRESS TEST  04-20-2015  dr fernande   normal nuclear study/  no ischemia/  normal LV function and wall motion, ef 74%   COLON SURGERY  2003   COLONOSCOPY  01/19/2013   COLOSTOMY TAKEDOWN  07/22/2002   CYSTOSCOPY WITH RETROGRADE PYELOGRAM, URETEROSCOPY AND STENT PLACEMENT Left 07/28/2015   Procedure: CYSTOSCOPY WITH LEFT RETROGRADE PYELOGRAM, POSSIBLE LEFT URETEROSCOPY AND STENT PLACEMENT;  Surgeon: Garnette Shack, MD;  Location: [facility];  Service: Urology;  Laterality: Left;   EXPLORATORY LAPARTOMY SIGMOID COLECTOMY/ COLOSTOMY  04/28/2002   perforated diverticulitis   KYPHOPLASTY     LAPAROSCOPY VENTRAL HERNIA REPAIR/  EXTENSIVE LYSIS ADHESIONS  05/04/2004   PACEMAKER GENERATOR CHANGE Left 09/16/2011   MDT ADDRL1 pacemaker   PACEMAKER PLACEMENT  05/11/2003   medtronic   PPM GENERATOR CHANGEOUT N/A 12/09/2023   Procedure: PPM GENERATOR CHANGEOUT;  Surgeon: Waddell Danelle ORN, MD;  Location: [facility];  Service: Cardiovascular;  Laterality: N/A;   ROBOT ASSISTED LAPAROSCOPIC NEPHRECTOMY Left 03/20/2016   Procedure: XI ROBOTIC ASSISTED LAPAROSCOPIC RETROPERITONEAL NEPHRECTOMY;  Surgeon: Ricardo Likens, MD;  Location: [facility];  Service: Urology;  Laterality: Left;   THYROIDECTOMY N/A 08/07/2012   Procedure: THYROIDECTOMY;  Surgeon: Krystal CHRISTELLA Spinner, MD;  Location: [facility];  Service: General;  Laterality: N/A;   TRANSTHORACIC ECHOCARDIOGRAM  10/12/2014   moderate focal basal LVH,  ef 55-60%,  grade 2 diastolic dysfunction, mild AV calcification without stenosis,  mild MR,  trivial PR    Family History   Problem Relation Age of Onset   Heart disease Mother        Stent in her 32s   Stroke Mother    Hypertension Mother    Varicose Veins Mother    Heart failure Father    Diverticulosis Sister    Hypertension Brother    Heart disease Brother    Hypertension Brother    Breast cancer Maternal Aunt 54 - 79   Heart attack Neg Hx    Social History:  reports that she quit smoking about 44 years ago. Her smoking use included cigarettes. She started smoking about 59 years ago. She has never used smokeless tobacco. She reports that she does not drink alcohol and does not use drugs.  Allergies:  Allergies  Allergen Reactions   Tape Rash    Paper Tape Only    Codeine Nausea And Vomiting   Latex Itching and Rash    Facility-Administered Medications Prior to Admission  Medication Dose Route Frequency Provider Last Rate Last Admin   [START ON 05/28/2024] denosumab  (PROLIA ) injection 60 mg  60 mg Subcutaneous Once Gherghe, Cristina, MD       Medications Prior to Admission  Medication Sig Dispense Refill   acetaminophen  (TYLENOL ) 500 MG tablet Take 1,000 mg by mouth every 8 (eight) hours as needed for moderate pain (pain score 4-6).     amLODipine  (NORVASC ) 10 MG tablet Take 1 tablet (10 mg total) by mouth daily. 90 tablet 3   apixaban  (ELIQUIS ) 5 MG TABS tablet Take 1 tablet (5 mg total) by mouth 2 (two) times daily. 180 tablet 1   azelastine  (ASTELIN ) 0.1 % nasal spray Place 1 spray into both nostrils 2 (two) times daily. Use in each nostril as directed 30 mL 12   Bacillus Coagulans-Inulin (PROBIOTIC-PREBIOTIC PO) Take 1 capsule by mouth daily.     Chlorpheniramine-Acetaminophen  (CORICIDIN HBP COLD/FLU PO) Take 2 tablets by mouth daily as needed (cold symptoms).     Cholecalciferol (VITAMIN D3 MAXIMUM STRENGTH) 125 MCG (5000 UT) capsule Take 5,000 Units by mouth daily.     denosumab  (PROLIA ) 60 MG/ML SOSY injection Inject 60 mg into the skin every 6 (six) months.     diclofenac  Sodium  (VOLTAREN  ARTHRITIS PAIN) 1 % GEL Apply 4 g topically 4 (four) times daily. (Patient taking differently: Apply 4 g topically 4 (four) times daily as needed (pain).) 150 g 3   fluticasone  (FLONASE ) 50 MCG/ACT nasal spray Place 2 sprays into both nostrils daily as needed for allergies or rhinitis. 16 g 5   hydrochlorothiazide  (MICROZIDE ) 12.5 MG capsule Take 1 capsule by mouth once daily 90 capsule 2   levocetirizine (XYZAL ) 5 MG tablet Take 1 tablet (5 mg total) by mouth every evening. 90 tablet 1   levothyroxine  (SYNTHROID ) 100 MCG tablet TAKE 1 TABLET BY MOUTH ONCE DAILY . APPOINTMENT REQUIRED FOR FUTURE REFILLS 90 tablet 3  lidocaine  4 % Place 2 patches onto the skin daily as needed (pain).     lisinopril  (ZESTRIL ) 40 MG tablet Take 1 tablet (40 mg total) by mouth daily. 90 tablet 1   loratadine  (ALLERGY RELIEF, LORATADINE ,) 10 MG tablet Take 1 tablet (10 mg total) by mouth daily as needed for allergies or rhinitis. 100 tablet 1   Menthol, Topical Analgesic, (STOPAIN ROLL-ON EX) Apply 1 Application topically daily as needed (pain).     Multiple Vitamin (MULTIVITAMIN WITH MINERALS) TABS tablet Take 1 tablet by mouth daily.     Multiple Vitamins-Minerals (AIRBORNE PO) Take 1 tablet by mouth daily as needed (immune support).     Multiple Vitamins-Minerals (PRESERVISION AREDS 2) CAPS Take 2 capsules by mouth daily.     OVER THE COUNTER MEDICATION Apply 1 Application topically daily as needed (neuropathy in feet). raitera neuropathy relief cream     oxyCODONE  (OXY IR/ROXICODONE ) 5 MG immediate release tablet Take 1 tablet (5 mg total) by mouth every 6 (six) hours as needed for severe pain (pain score 7-10). 15 tablet 0   Polyethyl Glycol-Propyl Glycol (LUBRICATING EYE DROPS OP) Place 1 drop into both eyes daily as needed (dry eyes). ROHTO eye drops     traMADol  (ULTRAM ) 50 MG tablet Take 1-2 tablets (50-100 mg total) by mouth every 6 (six) hours as needed for pain. 60 tablet 0   Zinc Oxide-Vitamin C  (ZINC PLUS VITAMIN C PO) Take 1 tablet by mouth every other day.      No results found. However, due to the size of the patient record, not all encounters were searched. Please check Results Review for a complete set of results. No results found.  Review of Systems  All other systems reviewed and are negative.   Blood pressure (!) 153/70, pulse 67, temperature 97.6 F (36.4 C), temperature source Oral, resp. rate 17, height 5' 3 (1.6 m), weight 63.5 kg, SpO2 99%. Physical Exam Vitals reviewed. Exam conducted with a chaperone present.  Cardiovascular:     Rate and Rhythm: Normal rate.  Pulmonary:     Effort: Pulmonary effort is normal.  Chest:       Comments: 2 incisions right breast.  Healing well. Lymphadenopathy:     Upper Body:     Right upper body: No axillary adenopathy.     Left upper body: No axillary adenopathy.  Skin:    General: Skin is warm.  Neurological:     General: No focal deficit present.     Mental Status: She is alert.  Psychiatric:        Mood and Affect: Mood normal.     A. 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 Tumors measure 1.4 x 1.2 x 1.0 cm (ribbon clip) and 1.0 x 0.8 x 0.6 cm (coil clip) (pT1b) Invasive carcinoma present in anterior and inferior margins [1.4 mm from superior margin, 7 mm from posterior margin and 8 mm from medial margin (final medial margin at least 18 mm)] Margins free of DCIS (4 mm from posterior margin) Negative for angiolymphatic invasion Prognostic markers (from report [id number]): Estrogen receptor positive, progesterone receptor positive, Ki-67 5% and HER2/neu oncoprotein expression negative (1+) Changes consistent with prior biopsies (ribbon  clip and coil clip) Fibrocystic changes including cystic dilatation of ducts, adenosis and usual duct hyperplasia  Microcalcifications present within tumor, fibrocystic changes and vessel walls  B. RIGHT LATERAL BREAST, LUMPECTOMY: Sclerosed papilloma, complex sclerosing lesion and small radial scar with usual duct hyperplasia Focal atypical ductal hyperplasia (ADH) Negative for carcinoma ADH 2 mm from the inferior margin Changes consistent with prior biopsy (X clip) Focal fibromatoid change Fibrocystic changes including stromal fibrosis, cystic dilatation of ducts, adenosis and usual duct hyperplasia Microcalcifications present within adenosis, complex sclerosing lesion and cystic ducts  Assessment/Plan Right breast cancer  Reexcision lumpectomy today due to positive anterior and inferior margins from medial lumpectomy The procedure has been discussed with the patient. Alternatives to surgery have been discussed with the patient.  Risks of surgery include bleeding,  Infection,  Seroma formation, death,  and the need for further surgery.   The patient understands and wishes to proceed.   Debby DELENA Shipper, MD 02/05/2024, 7:21 AM

## 2024-02-05 NOTE — Interval H&P Note (Signed)
 History and Physical Interval Note:  02/05/2024 9:20 AM  [name]  has presented today for surgery, with the diagnosis of RIGHT BREAST CANCER.  The various methods of treatment have been discussed with the patient and family. After consideration of risks, benefits and other options for treatment, the patient has consented to  Procedure(s) with comments: EXCISION, LESION, BREAST (Right) - RIGHT BREAST RE-EXCISION LUMPECTOMY as a surgical intervention.  The patient's history has been reviewed, patient examined, no change in status, stable for surgery.  I have reviewed the patient's chart and labs.  Questions were answered to the patient's satisfaction.     Jlen Wintle A Griffen Frayne

## 2024-02-05 NOTE — Anesthesia Postprocedure Evaluation (Signed)
 Anesthesia Post Note  Patient: [name]  Procedure(s) Performed: RE- EXCISION, LESION, RIGHT BREAST (Right: Breast)     Patient location during evaluation: PACU Anesthesia Type: General Level of consciousness: awake and alert Pain management: pain level controlled Vital Signs Assessment: post-procedure vital signs reviewed and stable Respiratory status: spontaneous breathing, nonlabored ventilation and respiratory function stable Cardiovascular status: blood pressure returned to baseline Postop Assessment: no apparent nausea or vomiting Anesthetic complications: no   No notable events documented.  Last Vitals:  Vitals:   02/05/24 1115 02/05/24 1130  BP: 113/60 130/62  Pulse: 67 60  Resp: 14 12  Temp:  (!) 36.4 C  SpO2: 96% 96%    Last Pain:  Vitals:   02/05/24 1134  TempSrc:   PainSc: 5                  Vertell Row

## 2024-02-05 NOTE — Anesthesia Procedure Notes (Signed)
 Procedure Name: LMA Insertion Date/Time: 02/05/2024 10:15 AM  Performed by: Claudene Arlin LABOR, CRNAPre-anesthesia Checklist: Patient identified, Emergency Drugs available, Suction available and Patient being monitored Patient Re-evaluated:Patient Re-evaluated prior to induction Oxygen Delivery Method: Circle System Utilized Preoxygenation: Pre-oxygenation with 100% oxygen Induction Type: IV induction Ventilation: Mask ventilation without difficulty LMA: LMA inserted LMA Size: 4.0 Number of attempts: 1 Placement Confirmation: positive ETCO2 Tube secured with: Tape Dental Injury: Teeth and Oropharynx as per pre-operative assessment

## 2024-02-05 NOTE — Transfer of Care (Signed)
 Immediate Anesthesia Transfer of Care Note  Patient: [name]  Procedure(s) Performed: RE- EXCISION, LESION, RIGHT BREAST (Right: Breast)  Patient Location: PACU  Anesthesia Type:General  Level of Consciousness: awake, alert , oriented, and patient cooperative  Airway & Oxygen Therapy: Patient Spontanous Breathing and Patient connected to nasal cannula oxygen  Post-op Assessment: Report given to RN and Post -op Vital signs reviewed and stable  Post vital signs: Reviewed and stable  Last Vitals:  Vitals Value Taken Time  BP 99/86 02/05/24 11:00  Temp    Pulse 92 02/05/24 11:02  Resp 14 02/05/24 11:02  SpO2 92 % 02/05/24 11:02  Vitals shown include unfiled device data.  Last Pain:  Vitals:   02/05/24 0725  TempSrc:   PainSc: 0-No pain         Complications: No notable events documented.

## 2024-02-06 ENCOUNTER — Encounter (HOSPITAL_COMMUNITY): Payer: Self-pay | Admitting: Surgery

## 2024-02-09 ENCOUNTER — Other Ambulatory Visit (HOSPITAL_BASED_OUTPATIENT_CLINIC_OR_DEPARTMENT_OTHER): Payer: Self-pay

## 2024-02-09 ENCOUNTER — Encounter

## 2024-02-09 ENCOUNTER — Ambulatory Visit: Payer: Self-pay | Admitting: Surgery

## 2024-02-09 LAB — SURGICAL PATHOLOGY

## 2024-02-10 ENCOUNTER — Encounter: Payer: Self-pay | Admitting: *Deleted

## 2024-02-11 NOTE — Assessment & Plan Note (Signed)
 12/03/2023:Screening mammogram detected right breast masses and calcifications, 2 masses at 2 o'clock position 1.5 cm and 1.7 cm: Biopsy grade 2 IDC ER 2%, PR 95%, Ki-67 5%, HER2 1+ negative; calcifications 0.8 cm biopsy: ADH in intraductal papilloma

## 2024-02-12 ENCOUNTER — Inpatient Hospital Stay: Attending: Hematology and Oncology | Admitting: Hematology and Oncology

## 2024-02-12 VITALS — BP 156/62 | HR 90 | Temp 98.1°F | Resp 18 | Ht 63.0 in | Wt 138.8 lb

## 2024-02-12 DIAGNOSIS — C50811 Malignant neoplasm of overlapping sites of right female breast: Secondary | ICD-10-CM | POA: Diagnosis present

## 2024-02-12 DIAGNOSIS — Z7901 Long term (current) use of anticoagulants: Secondary | ICD-10-CM | POA: Insufficient documentation

## 2024-02-12 DIAGNOSIS — Z885 Allergy status to narcotic agent status: Secondary | ICD-10-CM | POA: Diagnosis not present

## 2024-02-12 DIAGNOSIS — Z79890 Hormone replacement therapy (postmenopausal): Secondary | ICD-10-CM | POA: Insufficient documentation

## 2024-02-12 DIAGNOSIS — Z170 Estrogen receptor positive status [ER+]: Secondary | ICD-10-CM | POA: Diagnosis not present

## 2024-02-12 DIAGNOSIS — Z17411 Hormone receptor positive with human epidermal growth factor receptor 2 negative status: Secondary | ICD-10-CM | POA: Diagnosis not present

## 2024-02-12 DIAGNOSIS — R232 Flushing: Secondary | ICD-10-CM | POA: Diagnosis not present

## 2024-02-12 DIAGNOSIS — Z79899 Other long term (current) drug therapy: Secondary | ICD-10-CM | POA: Diagnosis not present

## 2024-02-12 DIAGNOSIS — Z1721 Progesterone receptor positive status: Secondary | ICD-10-CM | POA: Insufficient documentation

## 2024-02-12 DIAGNOSIS — M2560 Stiffness of unspecified joint, not elsewhere classified: Secondary | ICD-10-CM | POA: Insufficient documentation

## 2024-02-12 NOTE — Progress Notes (Signed)
 Patient Care Team: Antonio Meth, Jamee SAUNDERS, DO as PCP - General (Family Medicine) Fernande Elspeth BROCKS, MD as PCP - Electrophysiology (Cardiology) Alvaro Ricardo KATHEE Raddle., MD as Consulting Physician (Urology) Fernande Elspeth BROCKS, MD as Consulting Physician (Cardiology) Donnald Charleston, MD as Consulting Physician (Gastroenterology) Tyree Nanetta SAILOR, RN as Oncology Nurse Navigator Glean, Stephane BROCKS, RN (Inactive) as Oncology Nurse Navigator Vanderbilt Ned, MD as Consulting Physician (General Surgery) Odean Potts, MD as Consulting Physician (Hematology and Oncology) Izell Domino, MD as Attending Physician (Radiation Oncology)  DIAGNOSIS:  Encounter Diagnosis  Name Primary?   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  She has undergone two surgeries for breast cancer, with the second being precautionary. Minimal cancerous tissue was found, and all necessary tissues were removed. She is currently cancer-free.  The estrogen receptor status is 100%, and the KI-67  index is 2%.  She is considering treatment options, including anastrozole and radiation. Anastrozole is associated with side effects such as hot flashes, joint stiffness, and osteoporosis, which she already experiences. Radiation may cause temporary redness and fatigue. She has concerns about the long-term side effects of anastrozole, which is typically taken for five years, and has noted that some women become very sick from it and stop taking it.     ALLERGIES:  is allergic to tape, codeine, and latex.  MEDICATIONS:  Current Outpatient Medications  Medication Sig Dispense Refill   acetaminophen  (TYLENOL ) 500 MG tablet Take 1,000 mg by mouth every 8 (eight) hours as needed for moderate pain (pain score 4-6).     amLODipine  (NORVASC ) 10 MG tablet Take 1 tablet (10 mg total) by mouth daily. 90 tablet 3   apixaban  (ELIQUIS ) 5 MG TABS tablet Take 1 tablet (5 mg total) by mouth 2 (two) times daily. 180 tablet 1   azelastine  (ASTELIN ) 0.1 % nasal spray Place 1 spray into both nostrils 2 (two) times daily. Use in each nostril as directed 30 mL 12   Bacillus Coagulans-Inulin (PROBIOTIC-PREBIOTIC PO) Take 1 capsule by mouth daily.     Chlorpheniramine-Acetaminophen  (CORICIDIN HBP COLD/FLU PO) Take 2 tablets by mouth daily as needed (cold symptoms).     Cholecalciferol (VITAMIN D3 MAXIMUM STRENGTH) 125 MCG (5000 UT) capsule Take 5,000 Units by mouth daily.     denosumab  (PROLIA ) 60 MG/ML SOSY injection Inject 60 mg into the skin every 6 (six) months.     diclofenac  Sodium (VOLTAREN  ARTHRITIS PAIN) 1 % GEL Apply 4 g topically 4 (four) times daily. 150 g 3   fluticasone  (FLONASE ) 50 MCG/ACT nasal spray Place 2 sprays into  both nostrils daily as needed for allergies or rhinitis. 16 g 5   hydrochlorothiazide  (MICROZIDE ) 12.5 MG capsule Take 1 capsule by mouth once daily 90 capsule 2   levocetirizine (XYZAL ) 5 MG tablet Take 1 tablet (5 mg total) by mouth every evening. 90 tablet 1   levothyroxine   (SYNTHROID ) 100 MCG tablet TAKE 1 TABLET BY MOUTH ONCE DAILY . APPOINTMENT REQUIRED FOR FUTURE REFILLS 90 tablet 3   lidocaine  4 % Place 2 patches onto the skin daily as needed (pain).     lisinopril  (ZESTRIL ) 40 MG tablet Take 1 tablet (40 mg total) by mouth daily. 90 tablet 1   loratadine  (ALLERGY RELIEF, LORATADINE ,) 10 MG tablet Take 1 tablet (10 mg total) by mouth daily as needed for allergies or rhinitis. 100 tablet 1   Menthol, Topical Analgesic, (STOPAIN ROLL-ON EX) Apply 1 Application topically daily as needed (pain).     Multiple Vitamin (MULTIVITAMIN WITH MINERALS) TABS tablet Take 1 tablet by mouth daily.     Multiple Vitamins-Minerals (AIRBORNE PO) Take 1 tablet by mouth daily as needed (immune support).     Multiple Vitamins-Minerals (PRESERVISION AREDS 2) CAPS Take 2 capsules by mouth daily.     OVER THE COUNTER MEDICATION Apply 1 Application topically daily as needed (neuropathy in feet). raitera neuropathy relief cream     oxyCODONE  (OXY IR/ROXICODONE ) 5 MG immediate release tablet Take 1 tablet (5 mg total) by mouth every 6 (six) hours as needed for severe pain (pain score 7-10). 15 tablet 0   Polyethyl Glycol-Propyl Glycol (LUBRICATING EYE DROPS OP) Place 1 drop into both eyes daily as needed (dry eyes). ROHTO eye drops     traMADol  (ULTRAM ) 50 MG tablet Take 1-2 tablets (50-100 mg total) by mouth every 6 (six) hours as needed for pain. 60 tablet 0   Zinc Oxide-Vitamin C (ZINC PLUS VITAMIN C PO) Take 1 tablet by mouth every other day.     Current Facility-Administered Medications  Medication Dose Route Frequency Provider Last Rate Last Admin   [START ON 05/28/2024] denosumab  (PROLIA ) injection 60 mg  60 mg Subcutaneous Once Gherghe, Cristina, MD       Facility-Administered Medications Ordered in Other Visits  Medication Dose Route Frequency Provider Last Rate Last Admin   aminophylline  injection 150 mg  150 mg Intravenous BID PRN Maranda Leim DEL, MD   150 mg at 04/20/15 1200     PHYSICAL EXAMINATION: ECOG PERFORMANCE STATUS: 1 - Symptomatic but completely ambulatory  Vitals:   02/12/24 0849  BP: (!) 156/62  Pulse: 90  Resp: 18  Temp: 98.1 F (36.7 C)  SpO2: 100%   Filed Weights   02/12/24 0849  Weight: 138 lb 12.8 oz (63 kg)    LABORATORY DATA:  I have reviewed the data as listed    Latest Ref Rng & Units 01/09/2024   12:00 PM 12/10/2023   11:28 AM 12/08/2023   11:49 AM  CMP  Glucose 70 - 99 mg/dL 875  91  95   BUN 8 - 23 mg/dL 20  12  17    Creatinine 0.44 - 1.00 mg/dL 9.03  9.05  8.93   Sodium 135 - 145 mmol/L 137  140  139   Potassium 3.5 - 5.1 mmol/L 4.1  3.3  3.8   Chloride 98 - 111 mmol/L 102  101  99   CO2 22 - 32 mmol/L 23  28  17    Calcium  8.9 - 10.3 mg/dL 9.7  89.3  9.9  Total Protein 6.5 - 8.1 g/dL  8.4    Total Bilirubin 0.0 - 1.2 mg/dL  0.9    Alkaline Phos 38 - 126 U/L  57    AST 15 - 41 U/L  28    ALT 0 - 44 U/L  15      Lab Results  Component Value Date   WBC 5.9 01/09/2024   HGB 12.6 01/09/2024   HCT 38.5 01/09/2024   MCV 86.7 01/09/2024   PLT 160 01/09/2024   NEUTROABS 5.5 12/10/2023    ASSESSMENT & PLAN:  Malignant neoplasm of overlapping sites of right breast in female, estrogen receptor positive (HCC) 12/03/2023:Screening mammogram detected right breast masses and calcifications, 2 masses at 2 o'clock position 1.5 cm and 1.7 cm: Biopsy grade 2 IDC ER 2%, PR 95%, Ki-67 5%, HER2 1+ negative; calcifications 0.8 cm biopsy: 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 I  discussed the margins as well as lymph node surgeries. We also discussed the final staging along with previously performed ER/PR and HER-2/neu testing.  Treatment plan: Antiestrogen therapy versus adjuvant radiation. Anastrozole counseling: We discussed the risks and benefits of anti-estrogen therapy with aromatase inhibitors. These include but not limited to insomnia, hot flashes, mood changes, vaginal dryness, bone density loss, and weight gain. We strongly believe that the benefits far outweigh the risks. Patient understands these risks and consented to starting treatment. Planned treatment duration is 5 years.  After discussing risks and benefits, patient decided to proceed with radiation and skip the antiestrogen therapy. I do not need to see her post radiation.  Return to clinic in 4 months for survivorship care plan visit.  ------------------------------------- Assessment and Plan Assessment & Plan Estrogen receptor positive breast cancer in remission Breast cancer in remission post-surgery. Estrogen receptor 100% positive, KI-67 2%, indicating slow growth. Patient prefers radiation over anastrozole due to osteoporosis and side effects. - Discussed anastrozole and radiation options. Anastrozole side effects include hot flashes, joint stiffness, osteoporosis exacerbation. Radiation side effects include temporary redness, fatigue, possible long-term discomfort. Europa trial showed similar outcomes for both, better quality of life with radiation. - Refer to radiation oncologist for consultation on September 3rd. - Schedule survivorship appointment with nurse practitioner Manuelita in four months for post-treatment roadmap, including mammogram schedule and surveillance.  Osteoporosis Osteoporosis is a significant concern due to potential exacerbation by anastrozole.      No orders of the defined types were placed in this encounter.  The patient has a good understanding of the overall  plan. she agrees with it. she will call with any problems that may develop before the next visit here. Total time spent: 30 mins including face to face time and time spent for planning, charting and co-ordination of care   Naomi MARLA Chad, MD 02/12/24

## 2024-02-13 ENCOUNTER — Ambulatory Visit

## 2024-02-13 ENCOUNTER — Ambulatory Visit: Payer: Self-pay

## 2024-02-13 ENCOUNTER — Ambulatory Visit: Admitting: Radiation Oncology

## 2024-02-13 NOTE — Telephone Encounter (Signed)
 FYI Only or Action Required?: FYI only for provider.  Patient was last seen in primary care on 11/11/2023 by Antonio Meth, Jamee SAUNDERS, DO.  Called Nurse Triage reporting Urinary Frequency.  Symptoms began Unable to assess.  Interventions attempted: Other: Unable to assess.  Symptoms are: unchanged.  Triage Disposition: See Physician Within 24 Hours  Patient/caregiver understands and will follow disposition?:         Copied from CRM #[id number]. Topic: Clinical - Red Word Triage >> Feb 13, 2024  3:34 PM Rea ORN wrote: Red Word that prompted transfer to Nurse Triage: Pt has urine frequency, pressure with urinating, back pain and unable to fully empty bladder when urinating. Reason for Disposition  Urinating more frequently than usual (i.e., frequency) OR new-onset of the feeling of an urgent need to urinate (i.e., urgency)  Answer Assessment - Initial Assessment Questions Pt denied any fever or burning with urination , Pt not open to further triaging, wanted to schedule with PCP for Monday.    1. SYMPTOM: What's the main symptom you're concerned about? (e.g., frequency, incontinence)     Urinary frequency, pressure in lower, abdomen.   2. PAIN: Is there any pain? If Yes, ask: How bad is it? (Scale: 1-10; mild, moderate, severe)     No 3. CAUSE: What do you think is causing the symptoms?     UTI 4. OTHER SYMPTOMS: Do you have any other symptoms? (e.g., blood in urine, fever, flank pain, pain with urination)     None  Protocols used: Urinary Symptoms-A-AH

## 2024-02-16 ENCOUNTER — Other Ambulatory Visit (HOSPITAL_BASED_OUTPATIENT_CLINIC_OR_DEPARTMENT_OTHER): Payer: Self-pay

## 2024-02-16 ENCOUNTER — Encounter: Payer: Self-pay | Admitting: Family Medicine

## 2024-02-16 ENCOUNTER — Ambulatory Visit (INDEPENDENT_AMBULATORY_CARE_PROVIDER_SITE_OTHER): Admitting: Family Medicine

## 2024-02-16 VITALS — BP 128/70 | HR 83 | Temp 97.8°F | Resp 16 | Ht 63.0 in | Wt 138.2 lb

## 2024-02-16 DIAGNOSIS — R350 Frequency of micturition: Secondary | ICD-10-CM

## 2024-02-16 DIAGNOSIS — M546 Pain in thoracic spine: Secondary | ICD-10-CM

## 2024-02-16 DIAGNOSIS — M545 Low back pain, unspecified: Secondary | ICD-10-CM

## 2024-02-16 LAB — POC URINALSYSI DIPSTICK (AUTOMATED)
Bilirubin, UA: NEGATIVE
Blood, UA: NEGATIVE
Glucose, UA: NEGATIVE
Ketones, UA: NEGATIVE
Leukocytes, UA: NEGATIVE
Nitrite, UA: NEGATIVE
Protein, UA: NEGATIVE
Spec Grav, UA: <=1.005 — AB (ref 1.010–1.025)
Urobilinogen, UA: 0.2 U/dL
pH, UA: 6 (ref 5.0–8.0)

## 2024-02-16 MED ORDER — TRAMADOL HCL 50 MG PO TABS
50.0000 mg | ORAL_TABLET | Freq: Four times a day (QID) | ORAL | 0 refills | Status: DC | PRN
  Filled 2024-02-16 – 2024-02-20 (×2): qty 60, 8d supply, fill #0

## 2024-02-16 MED ORDER — CEPHALEXIN 500 MG PO CAPS
500.0000 mg | ORAL_CAPSULE | Freq: Two times a day (BID) | ORAL | 0 refills | Status: DC
  Filled 2024-02-16: qty 20, 10d supply, fill #0

## 2024-02-16 NOTE — Progress Notes (Signed)
 +      +   Subjective:    Patient ID: [name], female    DOB: April 03, 1941, 83 y.o.   MRN: [id number]  Chief Complaint  Patient presents with   Urinary Frequency    Pt states sxs going on for several months. Pt states having abdominal pressure.     HPI Patient is in today for urinary frequency and dysuria. Discussed the use of AI scribe software for clinical note transcription with the patient, who gave verbal consent to proceed.  History of Present Illness [name] is an 83 year old female who presents with urinary frequency and burning sensation.  She has been experiencing urinary frequency and a burning sensation since before her first surgery on January 15, 2024. She did not mention these symptoms to her healthcare providers at that time due to other health concerns. She also notes a strong odor associated with her symptoms.  She describes a sensation of pressure in the lower abdomen but denies any pain. She communicated her concerns about a possible UTI to the scheduler when making her appointment, which led to a series of questions and a transfer to a triage nurse.  She has undergone two surgeries recently, with the second surgery reportedly resolving previous issues. She also had a pacemaker procedure the day before her second surgery.   Past Medical History:  Diagnosis Date   Allergy    Atrioventricular block, complete (HCC)    a. 08/2011 Upgrade of PPM to MDT Adapta L Dual Chamber PPM ser # [id number].   Breast cancer (HCC)    Cardiac pacemaker in situ    Contact dermatitis and eczema    unspec cause   Dairy product intolerance    Depression    Diastolic dysfunction    GERD (gastroesophageal reflux disease)    Hiatal hernia    History of diverticulitis of colon    2003  perforated diverticulitis w/ surgical intervention   History of kidney stones    age 49   History of peripheral edema    lower extremities   History of thyroid   nodule    multinodular goiter  s/p  total thyroidectomy 2014   HTN (hypertension)    Hydronephrosis, left    Hypothyroidism    Mild sleep apnea    per study 04-14-2014   Nocturia    NSVT (nonsustained ventricular tachycardia) (HCC)    Osteoporosis    PAF (paroxysmal atrial fibrillation) (HCC)    PONV (postoperative nausea and vomiting)    Presence of permanent cardiac pacemaker    Pulmonary nodules    stable per last ct   Renal cell carcinoma of left kidney (HCC)    Superficial thrombophlebitis     Past Surgical History:  Procedure Laterality Date   BREAST BIOPSY Right 12/03/2023   MM RT BREAST BX W LOC DEV 1ST LESION IMAGE BX SPEC STEREO GUIDE 12/03/2023 [facility]   BREAST BIOPSY Right 12/03/2023   US  RT BREAST BX W LOC DEV EA ADD LESION IMG BX SPEC US  GUIDE 12/03/2023 [facility]   BREAST BIOPSY Right 12/03/2023   US  RT BREAST BX W LOC DEV 1ST LESION IMG BX SPEC US  GUIDE 12/03/2023 [facility]   BREAST BIOPSY  01/13/2024   MM RT RADIOACTIVE SEED LOC MAMMO GUIDE 01/13/2024 [facility]   BREAST BIOPSY  01/13/2024   MM RT RADIOACTIVE SEED EA ADD LESION LOC MAMMO GUIDE 01/13/2024 [facility]  BREAST BIOPSY  01/13/2024   MM RT RADIOACTIVE SEED EA ADD LESION LOC MAMMO GUIDE 01/13/2024 [facility]   BREAST LUMPECTOMY WITH RADIOACTIVE SEED LOCALIZATION Right 01/15/2024   Procedure: BREAST LUMPECTOMY WITH RADIOACTIVE SEED LOCALIZATION;  Surgeon: Vanderbilt Ned, MD;  Location: [facility];  Service: General;  Laterality: Right;  RIGHT BREAST SEED LUMPECTOMY BRACKETED   CARDIOVASCULAR STRESS TEST  04-20-2015  dr fernande   normal nuclear study/  no ischemia/  normal LV function and wall motion, ef 74%   COLON SURGERY  2003   COLONOSCOPY  01/19/2013   COLOSTOMY TAKEDOWN  07/22/2002   CYSTOSCOPY WITH RETROGRADE PYELOGRAM, URETEROSCOPY AND STENT PLACEMENT Left 07/28/2015   Procedure: CYSTOSCOPY WITH LEFT RETROGRADE PYELOGRAM, POSSIBLE LEFT URETEROSCOPY AND STENT  PLACEMENT;  Surgeon: Garnette Shack, MD;  Location: [facility];  Service: Urology;  Laterality: Left;   EXPLORATORY LAPARTOMY SIGMOID COLECTOMY/ COLOSTOMY  04/28/2002   perforated diverticulitis   KYPHOPLASTY     LAPAROSCOPY VENTRAL HERNIA REPAIR/  EXTENSIVE LYSIS ADHESIONS  05/04/2004   PACEMAKER GENERATOR CHANGE Left 09/16/2011   MDT ADDRL1 pacemaker   PACEMAKER PLACEMENT  05/11/2003   medtronic   PPM GENERATOR CHANGEOUT N/A 12/09/2023   Procedure: PPM GENERATOR CHANGEOUT;  Surgeon: Waddell Danelle ORN, MD;  Location: [facility];  Service: Cardiovascular;  Laterality: N/A;   RE-EXCISION OF BREAST LUMPECTOMY Right 02/05/2024   Procedure: RE- EXCISION, LESION, RIGHT BREAST;  Surgeon: Vanderbilt Ned, MD;  Location: [facility];  Service: General;  Laterality: Right;   ROBOT ASSISTED LAPAROSCOPIC NEPHRECTOMY Left 03/20/2016   Procedure: XI ROBOTIC ASSISTED LAPAROSCOPIC RETROPERITONEAL NEPHRECTOMY;  Surgeon: Ricardo Likens, MD;  Location: [facility];  Service: Urology;  Laterality: Left;   THYROIDECTOMY N/A 08/07/2012   Procedure: THYROIDECTOMY;  Surgeon: Krystal CHRISTELLA Spinner, MD;  Location: [facility];  Service: General;  Laterality: N/A;   TRANSTHORACIC ECHOCARDIOGRAM  10/12/2014   moderate focal basal LVH,  ef 55-60%,  grade 2 diastolic dysfunction, mild AV calcification without stenosis,  mild MR,  trivial PR    Family History  Problem Relation Age of Onset   Heart disease Mother        Stent in her 45s   Stroke Mother    Hypertension Mother    Varicose Veins Mother    Heart failure Father    Diverticulosis Sister    Hypertension Brother    Heart disease Brother    Hypertension Brother    Breast cancer Maternal Aunt 18 - 79   Heart attack Neg Hx     Social History   Socioeconomic History   Marital status: Single    Spouse name: Not on file   Number of children: 2   Years of education: 16   Highest education level: Not on file  Occupational History   Occupation: Retired  Nurse, adult for the [employer]    Employer: RETIRED  Tobacco Use   Smoking status: Former    Current packs/day: 0.00    Types: Cigarettes    Start date: 10/05/1964    Quit date: 10/06/1979    Years since quitting: 44.3   Smokeless tobacco: Never  Vaping Use   Vaping status: Never Used  Substance and Sexual Activity   Alcohol use: No   Drug use: No   Sexual activity: Not Currently    Birth control/protection: None  Other Topics Concern   Not on file  Social History Narrative   The patient is a Buyer, retail of [employer].  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  She was married for 22 years, divorced, and has remained single. She has 1 grown son, 1 grown daughter.  She has 3 grandchildren, 2 of her children   live in [place].  She is retired.  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 She had a long term companion, 32 years, who passed away Mar 17, 2011.   Social Drivers of Health   Financial Resource Strain: Patient Declined (02/13/2024)   Overall Financial Resource Strain (CARDIA)    Difficulty of Paying Living Expenses: Patient declined  Food Insecurity: Patient Declined (02/13/2024)   Hunger Vital Sign    Worried About Running Out of Food in the Last Year: Patient declined    Ran Out of Food in the Last Year: Patient declined  Transportation Needs: Patient Declined (02/13/2024)   PRAPARE - Administrator, Civil Service (Medical): Patient declined    Lack of Transportation (Non-Medical): Patient declined  Physical Activity: Unknown (02/13/2024)   Exercise Vital Sign    Days of Exercise per Week: Patient declined    Minutes of Exercise per Session: Not on file  Stress: Patient Declined (02/13/2024)   [employer] of Occupational Health - Occupational Stress Questionnaire    Feeling of Stress: Patient declined  Social Connections: Unknown (02/13/2024)   Social Connection and Isolation Panel    Frequency of  Communication with Friends and Family: Patient declined    Frequency of Social Gatherings with Friends and Family: Patient declined    Attends Religious Services: Patient declined    Database administrator or Organizations: Patient declined    Attends Banker Meetings: Not on file    Marital Status: Patient declined  Intimate Partner Violence: Not At Risk (03/21/2021)   Humiliation, Afraid, Rape, and Kick questionnaire    Fear of Current or Ex-Partner: No    Emotionally Abused: No    Physically Abused: No    Sexually Abused: No    Outpatient Medications Prior to Visit  Medication Sig Dispense Refill   acetaminophen  (TYLENOL ) 500 MG tablet Take 1,000 mg by mouth every 8 (eight) hours as needed for moderate pain (pain score 4-6).     amLODipine  (NORVASC ) 10 MG tablet Take 1 tablet (10 mg total) by mouth daily. 90 tablet 3   apixaban  (ELIQUIS ) 5 MG TABS tablet Take 1 tablet (5 mg total) by mouth 2 (two) times daily. 180 tablet 1   azelastine  (ASTELIN ) 0.1 % nasal spray Place 1 spray into both nostrils 2 (two) times daily. Use in each nostril as directed 30 mL 12   Bacillus Coagulans-Inulin (PROBIOTIC-PREBIOTIC PO) Take 1 capsule by mouth daily.     Chlorpheniramine-Acetaminophen  (CORICIDIN HBP COLD/FLU PO) Take 2 tablets by mouth daily as needed (cold symptoms).     Cholecalciferol (VITAMIN D3 MAXIMUM STRENGTH) 125 MCG (5000 UT) capsule Take 5,000 Units by mouth daily.     denosumab  (PROLIA ) 60 MG/ML SOSY injection Inject 60 mg into the skin every 6 (six) months.     diclofenac  Sodium (VOLTAREN  ARTHRITIS PAIN) 1 % GEL Apply 4 g topically 4 (four) times daily. 150 g 3   fluticasone  (FLONASE ) 50 MCG/ACT nasal spray Place 2 sprays into both nostrils daily as needed for allergies or rhinitis. 16 g 5   hydrochlorothiazide  (MICROZIDE ) 12.5 MG capsule Take 1 capsule by mouth once daily 90 capsule 2   levocetirizine (XYZAL ) 5 MG tablet Take 1 tablet (  5 mg total) by mouth every evening. 90  tablet 1   levothyroxine  (SYNTHROID ) 100 MCG tablet TAKE 1 TABLET BY MOUTH ONCE DAILY . APPOINTMENT REQUIRED FOR FUTURE REFILLS 90 tablet 3   lidocaine  4 % Place 2 patches onto the skin daily as needed (pain).     lisinopril  (ZESTRIL ) 40 MG tablet Take 1 tablet (40 mg total) by mouth daily. 90 tablet 1   loratadine  (ALLERGY RELIEF, LORATADINE ,) 10 MG tablet Take 1 tablet (10 mg total) by mouth daily as needed for allergies or rhinitis. 100 tablet 1   Menthol, Topical Analgesic, (STOPAIN ROLL-ON EX) Apply 1 Application topically daily as needed (pain).     Multiple Vitamin (MULTIVITAMIN WITH MINERALS) TABS tablet Take 1 tablet by mouth daily.     Multiple Vitamins-Minerals (AIRBORNE PO) Take 1 tablet by mouth daily as needed (immune support).     Multiple Vitamins-Minerals (PRESERVISION AREDS 2) CAPS Take 2 capsules by mouth daily.     OVER THE COUNTER MEDICATION Apply 1 Application topically daily as needed (neuropathy in feet). raitera neuropathy relief cream     oxyCODONE  (OXY IR/ROXICODONE ) 5 MG immediate release tablet Take 1 tablet (5 mg total) by mouth every 6 (six) hours as needed for severe pain (pain score 7-10). 15 tablet 0   Polyethyl Glycol-Propyl Glycol (LUBRICATING EYE DROPS OP) Place 1 drop into both eyes daily as needed (dry eyes). ROHTO eye drops     Zinc Oxide-Vitamin C (ZINC PLUS VITAMIN C PO) Take 1 tablet by mouth every other day.     traMADol  (ULTRAM ) 50 MG tablet Take 1-2 tablets (50-100 mg total) by mouth every 6 (six) hours as needed for pain. 60 tablet 0   Facility-Administered Medications Prior to Visit  Medication Dose Route Frequency Provider Last Rate Last Admin   aminophylline  injection 150 mg  150 mg Intravenous BID PRN Nelson, Katarina H, MD   150 mg at 04/20/15 1200   [START ON 05/28/2024] denosumab  (PROLIA ) injection 60 mg  60 mg Subcutaneous Once Gherghe, Cristina, MD        Allergies  Allergen Reactions   Tape Rash    Paper Tape Only    Codeine Nausea  And Vomiting   Latex Itching and Rash    Review of Systems  Constitutional:  Negative for fever and malaise/fatigue.  HENT:  Negative for congestion.   Eyes:  Negative for blurred vision.  Respiratory:  Negative for shortness of breath.   Cardiovascular:  Negative for chest pain, palpitations and leg swelling.  Gastrointestinal:  Negative for abdominal pain, blood in stool and nausea.  Genitourinary:  Positive for dysuria and frequency.  Musculoskeletal:  Negative for falls.  Skin:  Negative for rash.  Neurological:  Negative for dizziness, loss of consciousness and headaches.  Endo/Heme/Allergies:  Negative for environmental allergies.  Psychiatric/Behavioral:  Negative for depression. The patient is not nervous/anxious.        Objective:    Physical Exam Vitals and nursing note reviewed.  Constitutional:      General: She is not in acute distress.    Appearance: Normal appearance. She is well-developed.  HENT:     Head: Normocephalic and atraumatic.  Eyes:     General: No scleral icterus.       Right eye: No discharge.        Left eye: No discharge.  Cardiovascular:     Rate and Rhythm: Normal rate and regular rhythm.     Heart sounds: No murmur heard. Pulmonary:  Effort: Pulmonary effort is normal. No respiratory distress.     Breath sounds: Normal breath sounds.  Abdominal:     General: There is no distension.     Tenderness: There is no abdominal tenderness. There is no guarding or rebound.  Musculoskeletal:        General: Normal range of motion.     Cervical back: Normal range of motion and neck supple.     Right lower leg: No edema.     Left lower leg: No edema.  Skin:    General: Skin is warm and dry.  Neurological:     Mental Status: She is alert and oriented to person, place, and time.  Psychiatric:        Mood and Affect: Mood normal.        Behavior: Behavior normal.        Thought Content: Thought content normal.        Judgment: Judgment  normal.     BP 128/70 (BP Location: Left Arm, Patient Position: Sitting, Cuff Size: Normal)   Pulse 83   Temp 97.8 F (36.6 C) (Oral)   Resp 16   Ht 5' 3 (1.6 m)   Wt 138 lb 3.2 oz (62.7 kg)   SpO2 95%   BMI 24.48 kg/m  Wt Readings from Last 3 Encounters:  02/16/24 138 lb 3.2 oz (62.7 kg)  02/12/24 138 lb 12.8 oz (63 kg)  02/05/24 140 lb (63.5 kg)    Diabetic Foot Exam - Simple   No data filed    Lab Results  Component Value Date   WBC 5.9 01/09/2024   HGB 12.6 01/09/2024   HCT 38.5 01/09/2024   PLT 160 01/09/2024   GLUCOSE 124 (H) 01/09/2024   CHOL 172 01/06/2023   TRIG 89.0 01/06/2023   HDL 47.20 01/06/2023   LDLCALC 107 (H) 01/06/2023   ALT 15 12/10/2023   AST 28 12/10/2023   NA 137 01/09/2024   K 4.1 01/09/2024   CL 102 01/09/2024   CREATININE 0.96 01/09/2024   BUN 20 01/09/2024   CO2 23 01/09/2024   TSH 4.19 01/06/2023   INR 1.01 11/04/2013   HGBA1C 5.5 06/07/2016    Lab Results  Component Value Date   TSH 4.19 01/06/2023   Lab Results  Component Value Date   WBC 5.9 01/09/2024   HGB 12.6 01/09/2024   HCT 38.5 01/09/2024   MCV 86.7 01/09/2024   PLT 160 01/09/2024   Lab Results  Component Value Date   NA 137 01/09/2024   K 4.1 01/09/2024   CO2 23 01/09/2024   GLUCOSE 124 (H) 01/09/2024   BUN 20 01/09/2024   CREATININE 0.96 01/09/2024   BILITOT 0.9 12/10/2023   ALKPHOS 57 12/10/2023   AST 28 12/10/2023   ALT 15 12/10/2023   PROT 8.4 (H) 12/10/2023   ALBUMIN 5.4 (H) 12/10/2023   CALCIUM  9.7 01/09/2024   ANIONGAP 12 01/09/2024   EGFR 51 (L) 12/08/2023   GFR 50.55 (L) 01/06/2023   Lab Results  Component Value Date   CHOL 172 01/06/2023   Lab Results  Component Value Date   HDL 47.20 01/06/2023   Lab Results  Component Value Date   LDLCALC 107 (H) 01/06/2023   Lab Results  Component Value Date   TRIG 89.0 01/06/2023   Lab Results  Component Value Date   CHOLHDL 4 01/06/2023   Lab Results  Component Value Date    HGBA1C 5.5 06/07/2016  Assessment & Plan:  Urine frequency -     POCT Urinalysis Dipstick (Automated) -     Cephalexin ; Take 1 capsule (500 mg total) by mouth 2 (two) times daily.  Dispense: 20 capsule; Refill: 0  Back pain, lumbosacral -     traMADol  HCl; Take 1-2 tablets (50-100 mg total) by mouth every 6 (six) hours as needed for pain.  Dispense: 60 tablet; Refill: 0  Bilateral thoracic back pain -     traMADol  HCl; Take 1-2 tablets (50-100 mg total) by mouth every 6 (six) hours as needed for pain.  Dispense: 60 tablet; Refill: 0  Assessment and Plan Assessment & Plan Urinary tract infection   She experiences frequency, dysuria, and strong odor, persisting since before July 17th, prior to her first surgery. Lower abdominal pressure is present without pain. The condition has been ongoing without prior medical evaluation. UA normal ---- culture pending  Rx for keflex  given incase symptoms worsen  Lower abdominal pressure   No associated pain reported. Address lower abdominal pressure in context of urinary tract infection treatment.    Courtny Bennison R Lowne Chase, DO

## 2024-02-16 NOTE — Addendum Note (Signed)
 Addended by: ELOUISE POWELL HERO on: 02/16/2024 04:01 PM   Modules accepted: Orders

## 2024-02-17 LAB — URINE CULTURE
MICRO NUMBER:: 16845644
Result:: NO GROWTH
SPECIMEN QUALITY:: ADEQUATE

## 2024-02-18 ENCOUNTER — Ambulatory Visit: Payer: Self-pay | Admitting: Family Medicine

## 2024-02-18 ENCOUNTER — Encounter: Payer: Self-pay | Admitting: Family Medicine

## 2024-02-20 ENCOUNTER — Other Ambulatory Visit (HOSPITAL_BASED_OUTPATIENT_CLINIC_OR_DEPARTMENT_OTHER): Payer: Self-pay

## 2024-02-27 NOTE — Progress Notes (Signed)
 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 None Pacemaker/ICD? Yes Possible current pregnancy?None Is the patient on methotrexate? None  Current Complaints / other details:   None

## 2024-03-01 NOTE — Progress Notes (Signed)
 Radiation Oncology         [phone] ________________________________  Name: [name] MRN: [id number]  Date: 03/03/2024  DOB: September 05, 1940  Follow-Up Visit Note  Outpatient  CC: Antonio Cyndee Jamee JONELLE, DO  Odean Potts, MD  Diagnosis:   No diagnosis found. ***  Malignant neoplasm of overlapping sites of right breast in female, estrogen receptor positive (HCC) Stage IA (cT1c, cN0, cM0, G2, ER+, PR+, HER2-)  CHIEF COMPLAINT: Here to discuss management of right  breast cancer  Narrative:  The patient returns today for follow-up. She was last seen in the breast clinic on 12/10/23.    Patient opted to proceed with a right breast lumpectomy with radioactive seed localization on 01/15/24 under the care of Dr. Vanderbilt. Surgical pathology revealed: tumor size of 1.4 x 1.2 x 1.0 cm; histology of grade 2 invasive ductal adenocarcinoma with extensive extracellular mucin and 1.0 x 0.8 x 0.6 grade 2 invasive ductal adenocarcinoma with focal extracellular mucin and focal intermediate nuclear grade DCIS cribriform type without necrosis; margin status to invasive disease present in anterior and inferior margins 1.4 mm from superior margin, 7 mm from posterior margin and 8 mm from medial margin  ; all margin are negative for DCIS; nodal status of not applicable;  ER status: Positive 100%, strong staining; PR status negative, Her2 status 1+ negative; Grade 2. Ki-67: 2%.    As patient had positive margins, she underwent a re-excision of right breast lesion on 02/05/24. Surgical pathology indicated final margins are negative for carcinoma.      Subsequently, she presented for a follow up with Dr. Gudena on 02/12/24 to discuss further treatment plan. Upon discussion, she opted to proceed with radiation therapy and choose to not pursue anastrozole in fear of exacerbating her osteoporosis. Her most recent post-op follow up on 02/23/24 with Dr. Vanderbilt, she reported feeling well overall with no signs of infection.      Symptomatically, the patient reports: ***        ALLERGIES:  is allergic to tape, codeine, and latex.  Meds: Current Outpatient Medications  Medication Sig Dispense Refill   acetaminophen  (TYLENOL ) 500 MG tablet Take 1,000 mg by mouth every 8 (eight) hours as needed for moderate pain (pain score 4-6).     amLODipine  (NORVASC ) 10 MG tablet Take 1 tablet (10 mg total) by mouth daily. 90 tablet 3   apixaban  (ELIQUIS ) 5 MG TABS tablet Take 1 tablet (5 mg total) by mouth 2 (two) times daily. 180 tablet 1   azelastine  (ASTELIN ) 0.1 % nasal spray Place 1 spray into both nostrils 2 (two) times daily. Use in each nostril as directed 30 mL 12   Bacillus Coagulans-Inulin (PROBIOTIC-PREBIOTIC PO) Take 1 capsule by mouth daily.     cephALEXin  (KEFLEX ) 500 MG capsule Take 1 capsule (500 mg total) by mouth 2 (two) times daily. 20 capsule 0   Chlorpheniramine-Acetaminophen  (CORICIDIN HBP COLD/FLU PO) Take 2 tablets by mouth daily as needed (cold symptoms).     Cholecalciferol (VITAMIN D3 MAXIMUM STRENGTH) 125 MCG (5000 UT) capsule Take 5,000 Units by mouth daily.     denosumab  (PROLIA ) 60 MG/ML SOSY injection Inject 60 mg into the skin every 6 (six) months.     diclofenac  Sodium (VOLTAREN  ARTHRITIS PAIN) 1 % GEL Apply 4 g topically 4 (four) times daily. 150 g 3   fluticasone  (FLONASE ) 50 MCG/ACT nasal spray Place 2 sprays into both nostrils daily as needed for allergies or rhinitis. 16 g 5  hydrochlorothiazide  (MICROZIDE ) 12.5 MG capsule Take 1 capsule by mouth once daily 90 capsule 2   levocetirizine (XYZAL ) 5 MG tablet Take 1 tablet (5 mg total) by mouth every evening. 90 tablet 1   levothyroxine  (SYNTHROID ) 100 MCG tablet TAKE 1 TABLET BY MOUTH ONCE DAILY . APPOINTMENT REQUIRED FOR FUTURE REFILLS 90 tablet 3   lidocaine  4 % Place 2 patches onto the skin daily as needed (pain).     lisinopril  (ZESTRIL ) 40 MG tablet Take 1 tablet (40 mg total) by mouth daily. 90 tablet 1   loratadine  (ALLERGY RELIEF,  LORATADINE ,) 10 MG tablet Take 1 tablet (10 mg total) by mouth daily as needed for allergies or rhinitis. 100 tablet 1   Menthol, Topical Analgesic, (STOPAIN ROLL-ON EX) Apply 1 Application topically daily as needed (pain).     Multiple Vitamin (MULTIVITAMIN WITH MINERALS) TABS tablet Take 1 tablet by mouth daily.     Multiple Vitamins-Minerals (AIRBORNE PO) Take 1 tablet by mouth daily as needed (immune support).     Multiple Vitamins-Minerals (PRESERVISION AREDS 2) CAPS Take 2 capsules by mouth daily.     OVER THE COUNTER MEDICATION Apply 1 Application topically daily as needed (neuropathy in feet). raitera neuropathy relief cream     oxyCODONE  (OXY IR/ROXICODONE ) 5 MG immediate release tablet Take 1 tablet (5 mg total) by mouth every 6 (six) hours as needed for severe pain (pain score 7-10). 15 tablet 0   Polyethyl Glycol-Propyl Glycol (LUBRICATING EYE DROPS OP) Place 1 drop into both eyes daily as needed (dry eyes). ROHTO eye drops     traMADol  (ULTRAM ) 50 MG tablet Take 1-2 tablets (50-100 mg total) by mouth every 6 (six) hours as needed for pain. 60 tablet 0   Zinc Oxide-Vitamin C (ZINC PLUS VITAMIN C PO) Take 1 tablet by mouth every other day.     Current Facility-Administered Medications  Medication Dose Route Frequency Provider Last Rate Last Admin   [START ON 05/28/2024] denosumab  (PROLIA ) injection 60 mg  60 mg Subcutaneous Once Gherghe, Cristina, MD       Facility-Administered Medications Ordered in Other Encounters  Medication Dose Route Frequency Provider Last Rate Last Admin   aminophylline  injection 150 mg  150 mg Intravenous BID PRN Nelson, Katarina H, MD   150 mg at 04/20/15 1200    Physical Findings:  vitals were not taken for this visit. .     General: Alert and oriented, in no acute distress HEENT: Head is normocephalic. Extraocular movements are intact. Oropharynx is clear. Neck: Neck is supple, no palpable cervical or supraclavicular lymphadenopathy. Heart: Regular in  rate and rhythm with no murmurs, rubs, or gallops. Chest: Clear to auscultation bilaterally, with no rhonchi, wheezes, or rales. Abdomen: Soft, nontender, nondistended, with no rigidity or guarding. Extremities: No cyanosis or edema. Lymphatics: see Neck Exam Musculoskeletal: symmetric strength and muscle tone throughout. Neurologic: No obvious focalities. Speech is fluent.  Psychiatric: Judgment and insight are intact. Affect is appropriate. Breast exam reveals ***  Lab Findings: Lab Results  Component Value Date   WBC 5.9 01/09/2024   HGB 12.6 01/09/2024   HCT 38.5 01/09/2024   MCV 86.7 01/09/2024   PLT 160 01/09/2024    @LASTCHEMISTRY @  Radiographic Findings: No results found.  Impression/Plan: We discussed adjuvant radiotherapy today.  I recommend *** in order to ***.  I reviewed the logistics, benefits, risks, and potential side effects of this treatment in detail. Risks may include but not necessary be limited to acute and late injury  tissue in the radiation fields such as skin irritation (change in color/pigmentation, itching, dryness, pain, peeling). She may experience fatigue. We also discussed possible risk of long term cosmetic changes or scar tissue. There is also a smaller risk for lung toxicity, ***cardiac toxicity, ***brachial plexopathy, ***lymphedema, ***musculoskeletal changes, ***rib fragility or ***induction of a second malignancy, ***late chronic non-healing soft tissue wound.    The patient asked good questions which I answered to her satisfaction. She is enthusiastic about proceeding with treatment. A consent form has been *** signed and placed in her chart.  A total of *** medically necessary complex treatment devices will be fabricated and supervised by me: *** fields with MLCs for custom blocks to protect heart, and lungs;  and, a Vac-lok. MORE COMPLEX DEVICES MAY BE MADE IN DOSIMETRY FOR FIELD IN FIELD BEAMS FOR DOSE HOMOGENEITY.  I have requested : 3D  Simulation which is medically necessary to give adequate dose to at risk tissues while sparing lungs and heart.  I have requested a DVH of the following structures: lungs, heart, *** lumpectomy cavity.    The patient will receive *** Gy in *** fractions to the *** with *** fields.  This will be *** followed by a boost.  On date of service, in total, I spent *** minutes on this encounter. Patient was seen in person.  _____________________________________   Lauraine Golden, MD  This document serves as a record of services personally performed by Lauraine Golden, MD. It was created on her behalf by Reymundo Cartwright, a trained medical scribe. The creation of this record is based on the scribe's personal observations and the provider's statements to them. This document has been checked and approved by the attending provider.

## 2024-03-02 ENCOUNTER — Other Ambulatory Visit (HOSPITAL_BASED_OUTPATIENT_CLINIC_OR_DEPARTMENT_OTHER): Payer: Self-pay

## 2024-03-03 ENCOUNTER — Ambulatory Visit: Admitting: Dermatology

## 2024-03-03 ENCOUNTER — Ambulatory Visit
Admission: RE | Admit: 2024-03-03 | Discharge: 2024-03-03 | Disposition: A | Discharge status: Home / Self Care | Source: Ambulatory Visit | Attending: Radiation Oncology | Admitting: Radiation Oncology

## 2024-03-03 ENCOUNTER — Encounter: Payer: Self-pay | Admitting: Radiation Oncology

## 2024-03-03 ENCOUNTER — Encounter: Payer: Self-pay | Admitting: Cardiology

## 2024-03-03 VITALS — BP 184/83 | HR 91 | Temp 97.3°F | Resp 18 | Ht 63.0 in | Wt 137.2 lb

## 2024-03-03 DIAGNOSIS — C50211 Malignant neoplasm of upper-inner quadrant of right female breast: Secondary | ICD-10-CM | POA: Insufficient documentation

## 2024-03-03 DIAGNOSIS — Z170 Estrogen receptor positive status [ER+]: Secondary | ICD-10-CM | POA: Insufficient documentation

## 2024-03-03 DIAGNOSIS — Z79890 Hormone replacement therapy (postmenopausal): Secondary | ICD-10-CM | POA: Diagnosis not present

## 2024-03-03 DIAGNOSIS — Z791 Long term (current) use of non-steroidal anti-inflammatories (NSAID): Secondary | ICD-10-CM | POA: Insufficient documentation

## 2024-03-03 DIAGNOSIS — C50811 Malignant neoplasm of overlapping sites of right female breast: Secondary | ICD-10-CM | POA: Insufficient documentation

## 2024-03-03 DIAGNOSIS — Z79899 Other long term (current) drug therapy: Secondary | ICD-10-CM | POA: Diagnosis not present

## 2024-03-03 DIAGNOSIS — Z1721 Progesterone receptor positive status: Secondary | ICD-10-CM | POA: Insufficient documentation

## 2024-03-03 DIAGNOSIS — Z7901 Long term (current) use of anticoagulants: Secondary | ICD-10-CM | POA: Insufficient documentation

## 2024-03-03 DIAGNOSIS — Z1732 Human epidermal growth factor receptor 2 negative status: Secondary | ICD-10-CM | POA: Diagnosis not present

## 2024-03-03 DIAGNOSIS — Z950 Presence of cardiac pacemaker: Secondary | ICD-10-CM | POA: Diagnosis not present

## 2024-03-03 DIAGNOSIS — Z510 Encounter for antineoplastic radiation therapy: Secondary | ICD-10-CM | POA: Diagnosis not present

## 2024-03-03 DIAGNOSIS — Z923 Personal history of irradiation: Secondary | ICD-10-CM | POA: Diagnosis not present

## 2024-03-03 DIAGNOSIS — M810 Age-related osteoporosis without current pathological fracture: Secondary | ICD-10-CM | POA: Insufficient documentation

## 2024-03-03 NOTE — Progress Notes (Signed)
 TO BE COMPLETED BY RADIATION ONCOLOGIST OFFICE:   Patient Name: [name]   Date of Birth: [birth year]   Radiation Oncologist: Dr. Lauraine Golden   Site to be Treated: Right Breast  Will x-rays >10 MV be used? No  Will the radiation be >10 cm from the device? Yes  Planned Treatment Start Date: TBD  TO BE COMPLETED BY CARDIOLOGIST OFFICE:   Device Information:  Pacemaker [x]      ICD []    Brand: Medtronic: [phone]  Model #: Medtronic [id number] [address] MRI  Serial Number: [id number]     Date of Placement: 12/09/23  Site of Placement: Chest  Remote Device Check--Frequency: Every 91 days  Last Check: 12/23/2023  Is the Patient Pacer Dependent?:  Yes [x]   No []   Does cardiologist request Radiation Oncology to schedule device testing by vendor for the following:  Prior to the Initiation of Treatments?  Yes []  No [x]  During Treatments?  Yes [x]  No []  Post Radiation Treatments?  Yes [x]  No []   Is device monitoring necessary by vendor/cardiologist team during treatments?  Yes [x]   No []   Is cardiac monitoring by Radiation Oncology nursing necessary during treatments? Yes [x]   No []   Do you recommend device be relocated prior to Radiation Treatment? Yes []   No [x]   **PLEASE LIST ANY NOTES OR SPECIAL REQUESTS: Please let device clinic know when scheduled radiation will be. We will need to schedule remote checks after each treatment.       CARDIOLOGIST SIGNATURE:  Dr. Soyla Norton Per Device Clinic Standing Orders, [name]  03/03/2024 4:44 PM  **Please route completed form back to Radiation Oncology Nursing and P [facility] RAD ONC ADMIN, OR send an update if there will be a delay in having form completed by expected start date.  **Call [phone] if you have any questions or do not get an in-basket response from a Radiation Oncology staff member

## 2024-03-04 ENCOUNTER — Encounter: Payer: Self-pay | Admitting: Family Medicine

## 2024-03-04 LAB — SURGICAL PATHOLOGY

## 2024-03-05 ENCOUNTER — Other Ambulatory Visit: Payer: Self-pay | Admitting: Family Medicine

## 2024-03-05 DIAGNOSIS — M546 Pain in thoracic spine: Secondary | ICD-10-CM

## 2024-03-05 DIAGNOSIS — M545 Low back pain, unspecified: Secondary | ICD-10-CM

## 2024-03-07 ENCOUNTER — Other Ambulatory Visit (HOSPITAL_BASED_OUTPATIENT_CLINIC_OR_DEPARTMENT_OTHER): Payer: Self-pay

## 2024-03-08 NOTE — Progress Notes (Unsigned)
  Electrophysiology Office Note:   Date:  03/08/2024  ID:  [name], [name] 10/08/1940, MRN [id number]  Primary Cardiologist: None Primary Heart Failure: None Electrophysiologist: Danelle Birmingham, MD   {Click to update primary MD,subspecialty MD or APP then REFRESH:1}    History of Present Illness:   [name] is a 83 y.o. female with h/o CHB s/p PPM, AF, NSVT, HTN, CKD III, renal cancer s/p L nephrectomy, hypothyroidism s/p thyroidectomy & right breast cancer seen today for routine electrophysiology follow-up s/p generator change.   Since last being seen in our clinic the patient reports doing ***.    She denies chest pain, palpitations, dyspnea, PND, orthopnea, nausea, vomiting, dizziness, syncope, edema, weight gain, or early satiety.    Review of systems complete and found to be negative unless listed in HPI.    EP Information / Studies Reviewed:    EKG is ordered today. Personal review as below.      PPM Interrogation-  reviewed in detail today,  See PACEART report.  Device History: Medtronic Dual Chamber PPM implanted 05/11/2003 for CHB Generator Change 09/16/11, 12/09/23 Mixed Lead System > programmed unipolar (ventricular failure to pace / polarity switch - in Dr. Celine notes for years) Dependent   Risk Assessment/Calculations:    CHA2DS2-VASc Score = 6  {Confirm score is correct.  If not, click here to update score.  REFRESH note.  :1} This indicates a 9.7% annual risk of stroke. The patient's score is based upon: CHF History: 1 HTN History: 1 Diabetes History: 0 Stroke History: 0 Vascular Disease History: 1 Age Score: 2 Gender Score: 1   {This patient has a significant risk of stroke if diagnosed with atrial fibrillation.  Please consider VKA or DOAC agent for anticoagulation if the bleeding risk is acceptable.   You can also use the SmartPhrase .[email or web address] for documentation.   :[id number]} No BP recorded.  {Refresh Note OR Click here to enter BP  :1}***         Physical Exam:   VS:  There were no vitals taken for this visit.   Wt Readings from Last 3 Encounters:  03/03/24 137 lb 4 oz (62.3 kg)  02/16/24 138 lb 3.2 oz (62.7 kg)  02/12/24 138 lb 12.8 oz (63 kg)     GEN: Well nourished, well developed in no acute distress NECK: No JVD; No carotid bruits CARDIAC: {EPRHYTHM:28826}, no murmurs, rubs, gallops RESPIRATORY:  Clear to auscultation without rales, wheezing or rhonchi  ABDOMEN: Soft, non-tender, non-distended EXTREMITIES:  No edema; No deformity   ASSESSMENT AND PLAN:    CHB s/p Medtronic PPM  -Normal PPM function -See Pace Art report -No changes today -site well healed post generator change   Paroxysmal Atrial Fibrillation  CHA2DS2-VASc 6 -OAC for stroke prophylaxis  -***% burden on device   Secondary Hypercoagulable State  -continue Eliquis  5mg  BID, dose reviewed and appropriate by wt / Cr   Hypertension  -well controlled on current regimen ***  R Breast Cancer  -following with Dr. Vanderbilt  -opted out of anti-estrogen therapy.  Pursuing radiation therapy.    Disposition:   Follow up with Dr. Birmingham {EPFOLLOW LE:71826}  Signed, Daphne Barrack, NP-C, AGACNP-BC [facility] - Electrophysiology  03/08/2024, 7:38 AM

## 2024-03-09 ENCOUNTER — Encounter: Payer: Self-pay | Admitting: *Deleted

## 2024-03-09 DIAGNOSIS — Z170 Estrogen receptor positive status [ER+]: Secondary | ICD-10-CM

## 2024-03-09 DIAGNOSIS — Z510 Encounter for antineoplastic radiation therapy: Secondary | ICD-10-CM | POA: Diagnosis not present

## 2024-03-09 NOTE — Progress Notes (Signed)
 Contacted [employer] to have pacemaker device monitoring set up for patient during radiation treatments.  Patient is set to have monitoring during and post radiation treatments

## 2024-03-10 ENCOUNTER — Ambulatory Visit: Attending: Pulmonary Disease | Admitting: Pulmonary Disease

## 2024-03-10 ENCOUNTER — Encounter: Payer: Self-pay | Admitting: Pulmonary Disease

## 2024-03-10 VITALS — BP 142/68 | HR 83 | Ht 63.0 in | Wt 135.6 lb

## 2024-03-10 DIAGNOSIS — D6869 Other thrombophilia: Secondary | ICD-10-CM | POA: Diagnosis not present

## 2024-03-10 DIAGNOSIS — I480 Paroxysmal atrial fibrillation: Secondary | ICD-10-CM

## 2024-03-10 DIAGNOSIS — I442 Atrioventricular block, complete: Secondary | ICD-10-CM

## 2024-03-10 DIAGNOSIS — I10 Essential (primary) hypertension: Secondary | ICD-10-CM | POA: Diagnosis present

## 2024-03-10 DIAGNOSIS — Z950 Presence of cardiac pacemaker: Secondary | ICD-10-CM

## 2024-03-10 LAB — CUP PACEART INCLINIC DEVICE CHECK
Date Time Interrogation Session: 20250910161341
Implantable Lead Connection Status: 753985
Implantable Lead Connection Status: 753985
Implantable Lead Implant Date: 20041110
Implantable Lead Implant Date: 20041110
Implantable Lead Location: 753859
Implantable Lead Location: 753860
Implantable Pulse Generator Implant Date: 20250610

## 2024-03-10 NOTE — Patient Instructions (Signed)
 Medication Instructions:  Your physician recommends that you continue on your current medications as directed. Please refer to the Current Medication list given to you today.  *If you need a refill on your cardiac medications before your next appointment, please call your pharmacy*  Lab Work: NONE If you have labs (blood work) drawn today and your tests are completely normal, you will receive your results only by: MyChart Message (if you have MyChart) OR A paper copy in the mail If you have any lab test that is abnormal or we need to change your treatment, we will call you to review the results.  Testing/Procedures: NONE  Follow-Up: At [facility], you and your health needs are our priority.  As part of our continuing mission to provide you with exceptional heart care, our providers are all part of one team.  This team includes your primary Cardiologist (physician) and Advanced Practice Providers or APPs (Physician Assistants and Nurse Practitioners) who all work together to provide you with the care you need, when you need it.  Your next appointment:   1 year(s)  Provider:   DR. ALMETTA OR DAPHNE BARRACK, NP   We recommend signing up for the patient portal called MyChart.  Sign up information is provided on this After Visit Summary.  MyChart is used to connect with patients for Virtual Visits (Telemedicine).  Patients are able to view lab/test results, encounter notes, upcoming appointments, etc.  Non-urgent messages can be sent to your provider as well.   To learn more about what you can do with MyChart, go to [email or web address].

## 2024-03-11 ENCOUNTER — Encounter

## 2024-03-11 ENCOUNTER — Ambulatory Visit: Payer: Self-pay | Admitting: Cardiology

## 2024-03-11 ENCOUNTER — Ambulatory Visit

## 2024-03-11 DIAGNOSIS — I442 Atrioventricular block, complete: Secondary | ICD-10-CM | POA: Diagnosis not present

## 2024-03-11 LAB — CUP PACEART REMOTE DEVICE CHECK
Battery Remaining Longevity: 137 mo
Battery Voltage: 3.2 V
Brady Statistic AP VP Percent: 12.89 %
Brady Statistic AP VS Percent: 0 %
Brady Statistic AS VP Percent: 86.99 %
Brady Statistic AS VS Percent: 0.12 %
Brady Statistic RA Percent Paced: 12.66 %
Brady Statistic RV Percent Paced: 99.88 %
Date Time Interrogation Session: 20250911003031
Implantable Lead Connection Status: 753985
Implantable Lead Connection Status: 753985
Implantable Lead Implant Date: 20041110
Implantable Lead Implant Date: 20041110
Implantable Lead Location: 753859
Implantable Lead Location: 753860
Implantable Pulse Generator Implant Date: 20250610
Lead Channel Impedance Value: 304 Ohm
Lead Channel Impedance Value: 361 Ohm
Lead Channel Impedance Value: 418 Ohm
Lead Channel Impedance Value: 494 Ohm
Lead Channel Pacing Threshold Amplitude: 0.625 V
Lead Channel Pacing Threshold Amplitude: 0.875 V
Lead Channel Pacing Threshold Pulse Width: 0.4 ms
Lead Channel Pacing Threshold Pulse Width: 0.4 ms
Lead Channel Sensing Intrinsic Amplitude: 2.375 mV
Lead Channel Sensing Intrinsic Amplitude: 2.75 mV
Lead Channel Setting Pacing Amplitude: 2 V
Lead Channel Setting Pacing Amplitude: 2.5 V
Lead Channel Setting Pacing Pulse Width: 0.4 ms
Lead Channel Setting Sensing Sensitivity: 1.2 mV
Zone Setting Status: 755011

## 2024-03-15 ENCOUNTER — Other Ambulatory Visit: Payer: Self-pay | Admitting: *Deleted

## 2024-03-15 ENCOUNTER — Other Ambulatory Visit: Payer: Self-pay

## 2024-03-15 ENCOUNTER — Ambulatory Visit
Admission: RE | Admit: 2024-03-15 | Discharge: 2024-03-15 | Disposition: A | Discharge status: Home / Self Care | Source: Ambulatory Visit | Attending: Radiation Oncology | Admitting: Radiation Oncology

## 2024-03-15 DIAGNOSIS — Z510 Encounter for antineoplastic radiation therapy: Secondary | ICD-10-CM | POA: Diagnosis not present

## 2024-03-15 DIAGNOSIS — I480 Paroxysmal atrial fibrillation: Secondary | ICD-10-CM

## 2024-03-15 LAB — RAD ONC ARIA SESSION SUMMARY
Course Elapsed Days: 0
Plan Fractions Treated to Date: 1
Plan Prescribed Dose Per Fraction: 2.67 Gy
Plan Total Fractions Prescribed: 15
Plan Total Prescribed Dose: 40.05 Gy
Reference Point Dosage Given to Date: 2.67 Gy
Reference Point Session Dosage Given: 2.67 Gy
Session Number: 1

## 2024-03-15 MED ORDER — APIXABAN 5 MG PO TABS: 5.0000 mg | ORAL_TABLET | Freq: Two times a day (BID) | ORAL | 1 refills | Status: AC

## 2024-03-15 NOTE — Telephone Encounter (Signed)
 Eliquis  5mg  refill request received. Patient is 83 years old, weight-61.5kg, Crea-0.96 on 01/09/24, Diagnosis-Afib, and last seen by Daphne Barrack on 03/10/24. Dose is appropriate based on dosing criteria. Will send in refill to requested pharmacy.

## 2024-03-16 ENCOUNTER — Ambulatory Visit
Admission: RE | Admit: 2024-03-16 | Discharge: 2024-03-16 | Disposition: A | Discharge status: Home / Self Care | Source: Ambulatory Visit | Attending: Radiation Oncology | Admitting: Radiation Oncology

## 2024-03-16 ENCOUNTER — Other Ambulatory Visit: Payer: Self-pay

## 2024-03-16 DIAGNOSIS — Z510 Encounter for antineoplastic radiation therapy: Secondary | ICD-10-CM | POA: Diagnosis not present

## 2024-03-16 LAB — RAD ONC ARIA SESSION SUMMARY
Course Elapsed Days: 1
Plan Fractions Treated to Date: 2
Plan Prescribed Dose Per Fraction: 2.67 Gy
Plan Total Fractions Prescribed: 15
Plan Total Prescribed Dose: 40.05 Gy
Reference Point Dosage Given to Date: 5.34 Gy
Reference Point Session Dosage Given: 2.67 Gy
Session Number: 2

## 2024-03-17 ENCOUNTER — Other Ambulatory Visit: Payer: Self-pay

## 2024-03-17 ENCOUNTER — Institutional Professional Consult (permissible substitution) (INDEPENDENT_AMBULATORY_CARE_PROVIDER_SITE_OTHER): Admitting: Otolaryngology

## 2024-03-17 ENCOUNTER — Ambulatory Visit (INDEPENDENT_AMBULATORY_CARE_PROVIDER_SITE_OTHER): Admitting: Audiology

## 2024-03-17 ENCOUNTER — Ambulatory Visit
Admission: RE | Admit: 2024-03-17 | Discharge: 2024-03-17 | Disposition: A | Discharge status: Home / Self Care | Source: Ambulatory Visit | Attending: Radiation Oncology | Admitting: Radiation Oncology

## 2024-03-17 DIAGNOSIS — Z510 Encounter for antineoplastic radiation therapy: Secondary | ICD-10-CM | POA: Diagnosis not present

## 2024-03-17 LAB — RAD ONC ARIA SESSION SUMMARY
Course Elapsed Days: 2
Plan Fractions Treated to Date: 3
Plan Prescribed Dose Per Fraction: 2.67 Gy
Plan Total Fractions Prescribed: 15
Plan Total Prescribed Dose: 40.05 Gy
Reference Point Dosage Given to Date: 8.01 Gy
Reference Point Session Dosage Given: 2.67 Gy
Session Number: 3

## 2024-03-18 ENCOUNTER — Ambulatory Visit

## 2024-03-18 NOTE — Progress Notes (Signed)
 Remote PPM Transmission

## 2024-03-19 ENCOUNTER — Ambulatory Visit
Admission: RE | Admit: 2024-03-19 | Discharge: 2024-03-19 | Disposition: A | Discharge status: Home / Self Care | Source: Ambulatory Visit | Attending: Radiation Oncology | Admitting: Radiation Oncology

## 2024-03-19 ENCOUNTER — Other Ambulatory Visit: Payer: Self-pay

## 2024-03-19 DIAGNOSIS — Z510 Encounter for antineoplastic radiation therapy: Secondary | ICD-10-CM | POA: Diagnosis not present

## 2024-03-19 LAB — RAD ONC ARIA SESSION SUMMARY
Course Elapsed Days: 4
Plan Fractions Treated to Date: 4
Plan Prescribed Dose Per Fraction: 2.67 Gy
Plan Total Fractions Prescribed: 15
Plan Total Prescribed Dose: 40.05 Gy
Reference Point Dosage Given to Date: 10.68 Gy
Reference Point Session Dosage Given: 2.67 Gy
Session Number: 4

## 2024-03-21 ENCOUNTER — Other Ambulatory Visit: Payer: Self-pay | Admitting: Family Medicine

## 2024-03-21 DIAGNOSIS — M546 Pain in thoracic spine: Secondary | ICD-10-CM

## 2024-03-21 DIAGNOSIS — M545 Low back pain, unspecified: Secondary | ICD-10-CM

## 2024-03-22 ENCOUNTER — Other Ambulatory Visit (HOSPITAL_BASED_OUTPATIENT_CLINIC_OR_DEPARTMENT_OTHER): Payer: Self-pay

## 2024-03-22 ENCOUNTER — Ambulatory Visit
Admission: RE | Admit: 2024-03-22 | Discharge: 2024-03-22 | Disposition: A | Discharge status: Home / Self Care | Source: Ambulatory Visit | Attending: Radiation Oncology | Admitting: Radiation Oncology

## 2024-03-22 ENCOUNTER — Ambulatory Visit
Admission: RE | Admit: 2024-03-22 | Discharge: 2024-03-22 | Disposition: A | Discharge status: Home / Self Care | Source: Ambulatory Visit | Attending: Radiation Oncology

## 2024-03-22 ENCOUNTER — Other Ambulatory Visit: Payer: Self-pay

## 2024-03-22 DIAGNOSIS — Z510 Encounter for antineoplastic radiation therapy: Secondary | ICD-10-CM | POA: Diagnosis not present

## 2024-03-22 LAB — RAD ONC ARIA SESSION SUMMARY
Course Elapsed Days: 7
Plan Fractions Treated to Date: 5
Plan Prescribed Dose Per Fraction: 2.67 Gy
Plan Total Fractions Prescribed: 15
Plan Total Prescribed Dose: 40.05 Gy
Reference Point Dosage Given to Date: 13.35 Gy
Reference Point Session Dosage Given: 2.67 Gy
Session Number: 5

## 2024-03-22 MED ORDER — TRAMADOL HCL 50 MG PO TABS
50.0000 mg | ORAL_TABLET | Freq: Four times a day (QID) | ORAL | 0 refills | Status: DC | PRN
  Filled 2024-03-22: qty 60, 8d supply, fill #0

## 2024-03-22 NOTE — Telephone Encounter (Signed)
 Requesting: tramadol  50mg   Contract: None UDS: None Last Visit:  02/16/24 Next Visit: None Last Refill: 02/16/24 #60 and 0RF   Please Advise

## 2024-03-23 ENCOUNTER — Ambulatory Visit
Admission: RE | Admit: 2024-03-23 | Discharge: 2024-03-23 | Disposition: A | Discharge status: Home / Self Care | Source: Ambulatory Visit | Attending: Radiation Oncology

## 2024-03-23 ENCOUNTER — Other Ambulatory Visit: Payer: Self-pay

## 2024-03-23 DIAGNOSIS — Z510 Encounter for antineoplastic radiation therapy: Secondary | ICD-10-CM | POA: Diagnosis not present

## 2024-03-23 LAB — RAD ONC ARIA SESSION SUMMARY
Course Elapsed Days: 8
Plan Fractions Treated to Date: 6
Plan Prescribed Dose Per Fraction: 2.67 Gy
Plan Total Fractions Prescribed: 15
Plan Total Prescribed Dose: 40.05 Gy
Reference Point Dosage Given to Date: 16.02 Gy
Reference Point Session Dosage Given: 2.67 Gy
Session Number: 6

## 2024-03-24 ENCOUNTER — Ambulatory Visit
Admission: RE | Admit: 2024-03-24 | Discharge: 2024-03-24 | Disposition: A | Discharge status: Home / Self Care | Source: Ambulatory Visit | Attending: Radiation Oncology | Admitting: Radiation Oncology

## 2024-03-24 ENCOUNTER — Other Ambulatory Visit: Payer: Self-pay

## 2024-03-24 DIAGNOSIS — Z510 Encounter for antineoplastic radiation therapy: Secondary | ICD-10-CM | POA: Diagnosis not present

## 2024-03-24 LAB — RAD ONC ARIA SESSION SUMMARY
Course Elapsed Days: 9
Plan Fractions Treated to Date: 7
Plan Prescribed Dose Per Fraction: 2.67 Gy
Plan Total Fractions Prescribed: 15
Plan Total Prescribed Dose: 40.05 Gy
Reference Point Dosage Given to Date: 18.69 Gy
Reference Point Session Dosage Given: 2.67 Gy
Session Number: 7

## 2024-03-25 ENCOUNTER — Ambulatory Visit
Admission: RE | Admit: 2024-03-25 | Discharge: 2024-03-25 | Disposition: A | Discharge status: Home / Self Care | Source: Ambulatory Visit | Attending: Radiation Oncology | Admitting: Radiation Oncology

## 2024-03-25 ENCOUNTER — Other Ambulatory Visit: Payer: Self-pay

## 2024-03-25 DIAGNOSIS — Z510 Encounter for antineoplastic radiation therapy: Secondary | ICD-10-CM | POA: Diagnosis not present

## 2024-03-25 LAB — RAD ONC ARIA SESSION SUMMARY
Course Elapsed Days: 10
Plan Fractions Treated to Date: 8
Plan Prescribed Dose Per Fraction: 2.67 Gy
Plan Total Fractions Prescribed: 15
Plan Total Prescribed Dose: 40.05 Gy
Reference Point Dosage Given to Date: 21.36 Gy
Reference Point Session Dosage Given: 2.67 Gy
Session Number: 8

## 2024-03-26 ENCOUNTER — Other Ambulatory Visit: Payer: Self-pay

## 2024-03-26 ENCOUNTER — Ambulatory Visit
Admission: RE | Admit: 2024-03-26 | Discharge: 2024-03-26 | Disposition: A | Discharge status: Home / Self Care | Source: Ambulatory Visit | Attending: Radiation Oncology | Admitting: Radiation Oncology

## 2024-03-26 DIAGNOSIS — Z510 Encounter for antineoplastic radiation therapy: Secondary | ICD-10-CM | POA: Diagnosis not present

## 2024-03-26 LAB — RAD ONC ARIA SESSION SUMMARY
Course Elapsed Days: 11
Plan Fractions Treated to Date: 9
Plan Prescribed Dose Per Fraction: 2.67 Gy
Plan Total Fractions Prescribed: 15
Plan Total Prescribed Dose: 40.05 Gy
Reference Point Dosage Given to Date: 24.03 Gy
Reference Point Session Dosage Given: 2.67 Gy
Session Number: 9

## 2024-03-29 ENCOUNTER — Ambulatory Visit
Admission: RE | Admit: 2024-03-29 | Discharge: 2024-03-29 | Disposition: A | Discharge status: Home / Self Care | Source: Ambulatory Visit | Attending: Radiation Oncology | Admitting: Radiation Oncology

## 2024-03-29 ENCOUNTER — Other Ambulatory Visit: Payer: Self-pay

## 2024-03-29 DIAGNOSIS — Z510 Encounter for antineoplastic radiation therapy: Secondary | ICD-10-CM | POA: Diagnosis not present

## 2024-03-29 LAB — RAD ONC ARIA SESSION SUMMARY
Course Elapsed Days: 14
Plan Fractions Treated to Date: 10
Plan Prescribed Dose Per Fraction: 2.67 Gy
Plan Total Fractions Prescribed: 15
Plan Total Prescribed Dose: 40.05 Gy
Reference Point Dosage Given to Date: 26.7 Gy
Reference Point Session Dosage Given: 2.67 Gy
Session Number: 10

## 2024-03-30 ENCOUNTER — Ambulatory Visit
Admission: RE | Admit: 2024-03-30 | Discharge: 2024-03-30 | Disposition: A | Source: Ambulatory Visit | Attending: Radiation Oncology

## 2024-03-30 ENCOUNTER — Other Ambulatory Visit: Payer: Self-pay

## 2024-03-30 DIAGNOSIS — Z510 Encounter for antineoplastic radiation therapy: Secondary | ICD-10-CM | POA: Diagnosis not present

## 2024-03-30 LAB — RAD ONC ARIA SESSION SUMMARY
Course Elapsed Days: 15
Plan Fractions Treated to Date: 11
Plan Prescribed Dose Per Fraction: 2.67 Gy
Plan Total Fractions Prescribed: 15
Plan Total Prescribed Dose: 40.05 Gy
Reference Point Dosage Given to Date: 29.37 Gy
Reference Point Session Dosage Given: 2.67 Gy
Session Number: 11

## 2024-03-31 ENCOUNTER — Other Ambulatory Visit: Payer: Self-pay

## 2024-03-31 ENCOUNTER — Ambulatory Visit
Admission: RE | Admit: 2024-03-31 | Discharge: 2024-03-31 | Disposition: A | Discharge status: Home / Self Care | Source: Ambulatory Visit | Attending: Radiation Oncology | Admitting: Radiation Oncology

## 2024-03-31 DIAGNOSIS — Z170 Estrogen receptor positive status [ER+]: Secondary | ICD-10-CM | POA: Diagnosis not present

## 2024-03-31 DIAGNOSIS — Z510 Encounter for antineoplastic radiation therapy: Secondary | ICD-10-CM | POA: Insufficient documentation

## 2024-03-31 DIAGNOSIS — C50811 Malignant neoplasm of overlapping sites of right female breast: Secondary | ICD-10-CM | POA: Diagnosis present

## 2024-03-31 LAB — RAD ONC ARIA SESSION SUMMARY
Course Elapsed Days: 16
Plan Fractions Treated to Date: 12
Plan Prescribed Dose Per Fraction: 2.67 Gy
Plan Total Fractions Prescribed: 15
Plan Total Prescribed Dose: 40.05 Gy
Reference Point Dosage Given to Date: 32.04 Gy
Reference Point Session Dosage Given: 2.67 Gy
Session Number: 12

## 2024-04-01 ENCOUNTER — Ambulatory Visit
Admission: RE | Admit: 2024-04-01 | Discharge: 2024-04-01 | Disposition: A | Discharge status: Home / Self Care | Source: Ambulatory Visit | Attending: Radiation Oncology | Admitting: Radiation Oncology

## 2024-04-01 ENCOUNTER — Other Ambulatory Visit: Payer: Self-pay

## 2024-04-01 ENCOUNTER — Ambulatory Visit

## 2024-04-01 DIAGNOSIS — Z510 Encounter for antineoplastic radiation therapy: Secondary | ICD-10-CM | POA: Diagnosis not present

## 2024-04-01 LAB — RAD ONC ARIA SESSION SUMMARY
Course Elapsed Days: 17
Plan Fractions Treated to Date: 13
Plan Prescribed Dose Per Fraction: 2.67 Gy
Plan Total Fractions Prescribed: 15
Plan Total Prescribed Dose: 40.05 Gy
Reference Point Dosage Given to Date: 34.71 Gy
Reference Point Session Dosage Given: 2.67 Gy
Session Number: 13

## 2024-04-02 ENCOUNTER — Other Ambulatory Visit: Payer: Self-pay

## 2024-04-02 ENCOUNTER — Ambulatory Visit
Admission: RE | Admit: 2024-04-02 | Discharge: 2024-04-02 | Disposition: A | Discharge status: Home / Self Care | Source: Ambulatory Visit | Attending: Radiation Oncology | Admitting: Radiation Oncology

## 2024-04-02 ENCOUNTER — Ambulatory Visit

## 2024-04-02 DIAGNOSIS — Z510 Encounter for antineoplastic radiation therapy: Secondary | ICD-10-CM | POA: Diagnosis not present

## 2024-04-02 LAB — RAD ONC ARIA SESSION SUMMARY
Course Elapsed Days: 18
Plan Fractions Treated to Date: 14
Plan Prescribed Dose Per Fraction: 2.67 Gy
Plan Total Fractions Prescribed: 15
Plan Total Prescribed Dose: 40.05 Gy
Reference Point Dosage Given to Date: 37.38 Gy
Reference Point Session Dosage Given: 2.67 Gy
Session Number: 14

## 2024-04-03 ENCOUNTER — Other Ambulatory Visit: Payer: Self-pay | Admitting: Family Medicine

## 2024-04-03 DIAGNOSIS — I10 Essential (primary) hypertension: Secondary | ICD-10-CM

## 2024-04-05 ENCOUNTER — Telehealth: Payer: Self-pay | Admitting: Radiation Oncology

## 2024-04-05 ENCOUNTER — Other Ambulatory Visit (HOSPITAL_BASED_OUTPATIENT_CLINIC_OR_DEPARTMENT_OTHER): Payer: Self-pay

## 2024-04-05 ENCOUNTER — Ambulatory Visit
Admission: RE | Admit: 2024-04-05 | Discharge: 2024-04-05 | Disposition: A | Source: Ambulatory Visit | Attending: Radiation Oncology

## 2024-04-05 ENCOUNTER — Other Ambulatory Visit: Payer: Self-pay

## 2024-04-05 ENCOUNTER — Ambulatory Visit
Admission: RE | Admit: 2024-04-05 | Discharge: 2024-04-05 | Disposition: A | Discharge status: Home / Self Care | Source: Ambulatory Visit | Attending: Radiation Oncology | Admitting: Radiation Oncology

## 2024-04-05 DIAGNOSIS — Z510 Encounter for antineoplastic radiation therapy: Secondary | ICD-10-CM | POA: Diagnosis not present

## 2024-04-05 LAB — RAD ONC ARIA SESSION SUMMARY
Course Elapsed Days: 21
Plan Fractions Treated to Date: 15
Plan Prescribed Dose Per Fraction: 2.67 Gy
Plan Total Fractions Prescribed: 15
Plan Total Prescribed Dose: 40.05 Gy
Reference Point Dosage Given to Date: 40.05 Gy
Reference Point Session Dosage Given: 2.67 Gy
Session Number: 15

## 2024-04-05 MED ORDER — LISINOPRIL 40 MG PO TABS
40.0000 mg | ORAL_TABLET | Freq: Every day | ORAL | 0 refills | Status: DC
  Filled 2024-04-05: qty 90, 90d supply, fill #0

## 2024-04-05 NOTE — Telephone Encounter (Signed)
 10/6 @ 10:23 am Left voicemail to sch patient for 1 mon follow up and to confirm date/time.

## 2024-04-06 ENCOUNTER — Ambulatory Visit (INDEPENDENT_AMBULATORY_CARE_PROVIDER_SITE_OTHER)

## 2024-04-06 DIAGNOSIS — Z23 Encounter for immunization: Secondary | ICD-10-CM

## 2024-04-06 NOTE — Radiation Completion Notes (Signed)
 Patient Name: [name], [name] MRN: [id number] Date of Birth: Nov 25, 1940 Referring Physician: JAMEE SHANKS, M.D. Date of Service: 2024-04-06 Radiation Oncologist: Lauraine Golden, M.D. [facility]                             RADIATION ONCOLOGY END OF TREATMENT NOTE     Diagnosis: C50.211 Malignant neoplasm of upper-inner quadrant of right female breast Staging on 2023-12-10: Malignant neoplasm of overlapping sites of right breast in female, estrogen receptor positive (HCC) T=cT1c, N=cN0, M=cM0 Intent: Curative     ==========DELIVERED PLANS==========  First Treatment Date: 2024-03-15 Last Treatment Date: 2024-04-05   Plan Name: Breast_R Site: Breast, Right Technique: 3D Mode: Photon Dose Per Fraction: 2.67 Gy Prescribed Dose (Delivered / Prescribed): 40.05 Gy / 40.05 Gy Prescribed Fxs (Delivered / Prescribed): 15 / 15     ==========ON TREATMENT VISIT DATES========== 2024-03-16, 2024-03-22, 2024-03-29, 2024-04-05     ==========UPCOMING VISITS========== 06/14/2024 [facility] C[facility] PLAN VISIT Crawford Morna Pickle, NP  06/10/2024 [facility] REMOTE PACER [facility] HVT DEVICE REMOTES  05/31/2024 [facility]-ENDOCRINOLOGY OFFICE VISIT KAREL Trixie File, MD  05/06/2024 [facility]RADIATION ONC FOLLOW UP 20 Golden Lauraine, MD  04/30/2024 [facility] CONSULT 30 Okey Burns, MD  04/30/2024 [facility] ADULT HEARING EVAL Leroux-Martinez, Rosaline Jansky, AUD  04/06/2024 [facility] FLU SHOT [facility] CLINICAL SUPPORT        ==========APPENDIX - ON TREATMENT VISIT NOTES==========   See weekly On Treatment Notes in Epic for details in the Media tab (listed as Progress notes on the On Treatment Visit Dates listed above).

## 2024-04-12 ENCOUNTER — Other Ambulatory Visit (HOSPITAL_BASED_OUTPATIENT_CLINIC_OR_DEPARTMENT_OTHER): Payer: Self-pay

## 2024-04-12 ENCOUNTER — Other Ambulatory Visit: Payer: Self-pay | Admitting: Family Medicine

## 2024-04-12 DIAGNOSIS — H6993 Unspecified Eustachian tube disorder, bilateral: Secondary | ICD-10-CM

## 2024-04-12 MED ORDER — AZELASTINE HCL 0.1 % NA SOLN
1.0000 | Freq: Two times a day (BID) | NASAL | 12 refills | Status: AC
  Filled 2024-04-12: qty 30, 30d supply, fill #0
  Filled 2024-07-15: qty 30, 30d supply, fill #1

## 2024-04-15 ENCOUNTER — Other Ambulatory Visit: Payer: Self-pay

## 2024-04-15 ENCOUNTER — Other Ambulatory Visit (HOSPITAL_COMMUNITY): Payer: Self-pay

## 2024-04-15 ENCOUNTER — Other Ambulatory Visit (HOSPITAL_BASED_OUTPATIENT_CLINIC_OR_DEPARTMENT_OTHER): Payer: Self-pay

## 2024-04-16 ENCOUNTER — Other Ambulatory Visit: Payer: Self-pay | Admitting: Family Medicine

## 2024-04-16 ENCOUNTER — Other Ambulatory Visit (HOSPITAL_BASED_OUTPATIENT_CLINIC_OR_DEPARTMENT_OTHER): Payer: Self-pay

## 2024-04-16 DIAGNOSIS — M545 Low back pain, unspecified: Secondary | ICD-10-CM

## 2024-04-16 DIAGNOSIS — M546 Pain in thoracic spine: Secondary | ICD-10-CM

## 2024-04-16 MED ORDER — TRAMADOL HCL 50 MG PO TABS
50.0000 mg | ORAL_TABLET | Freq: Four times a day (QID) | ORAL | 0 refills | Status: DC | PRN
  Filled 2024-04-16: qty 60, 8d supply, fill #0

## 2024-04-16 NOTE — Telephone Encounter (Signed)
 Requesting: tramadol  50mg  Contract: None UDS: None Last Visit: 02/16/24 Next Visit: None Last Refill: 03/22/24 #60 and 0RF   Please Advise

## 2024-04-17 ENCOUNTER — Ambulatory Visit: Payer: Self-pay | Admitting: *Deleted

## 2024-04-17 LAB — CUP PACEART REMOTE DEVICE CHECK
Battery Impedance: 6115 Ohm
Battery Remaining Longevity: 1 mo — CL
Battery Voltage: 2.6 V
Brady Statistic AP VP Percent: 5 %
Brady Statistic AP VS Percent: 0 %
Brady Statistic AS VP Percent: 95 %
Brady Statistic AS VS Percent: 0 %
Date Time Interrogation Session: 20250509094240
Implantable Pulse Generator Implant Date: 20130318
Lead Channel Impedance Value: 384 Ohm
Lead Channel Impedance Value: 532 Ohm
Lead Channel Pacing Threshold Amplitude: 0.625 V
Lead Channel Pacing Threshold Amplitude: 0.625 V
Lead Channel Pacing Threshold Pulse Width: 0.4 ms
Lead Channel Pacing Threshold Pulse Width: 0.4 ms
Lead Channel Setting Pacing Amplitude: 2 V
Lead Channel Setting Pacing Amplitude: 2.5 V
Lead Channel Setting Pacing Pulse Width: 0.4 ms
Lead Channel Setting Sensing Sensitivity: 4 mV
Zone Setting Status: 755011
Zone Setting Status: 755011

## 2024-04-21 ENCOUNTER — Telehealth (INDEPENDENT_AMBULATORY_CARE_PROVIDER_SITE_OTHER): Payer: Self-pay | Admitting: Otolaryngology

## 2024-04-21 NOTE — Telephone Encounter (Signed)
 Lvm to r/s 04/30/24 appointment

## 2024-04-28 NOTE — Progress Notes (Signed)
 [name] is here today for follow up post radiation to the breast.   Breast Side:Right   They completed their radiation on: 04/05/2024  Everything is going great,   Does the patient complain of any of the following: Post radiation skin issues: Peeling under breast, used cortisone and skin has healed up. Encouraged to use vitamin E oil  Breast Tenderness: Breast on radiation treatment site is tender to the touch Breast Swelling: None Lymphadema: None Range of Motion limitations: None Fatigue post radiation: None Appetite good/fair/poor: Good Patient  Additional comments if applicable:  None

## 2024-04-30 ENCOUNTER — Institutional Professional Consult (permissible substitution) (INDEPENDENT_AMBULATORY_CARE_PROVIDER_SITE_OTHER): Admitting: Otolaryngology

## 2024-04-30 ENCOUNTER — Ambulatory Visit (INDEPENDENT_AMBULATORY_CARE_PROVIDER_SITE_OTHER): Admitting: Audiology

## 2024-05-03 ENCOUNTER — Encounter: Payer: Self-pay | Admitting: Radiology

## 2024-05-03 NOTE — Telephone Encounter (Signed)
 Prolia  VOB initiated via [email or web address]  Next Prolia  inj DUE: 05/29/24

## 2024-05-06 ENCOUNTER — Other Ambulatory Visit: Payer: Self-pay

## 2024-05-06 ENCOUNTER — Ambulatory Visit
Admission: RE | Admit: 2024-05-06 | Discharge: 2024-05-06 | Disposition: A | Discharge status: Home / Self Care | Source: Ambulatory Visit | Attending: Radiation Oncology | Admitting: Radiation Oncology

## 2024-05-06 DIAGNOSIS — C50211 Malignant neoplasm of upper-inner quadrant of right female breast: Secondary | ICD-10-CM

## 2024-05-10 ENCOUNTER — Other Ambulatory Visit (HOSPITAL_BASED_OUTPATIENT_CLINIC_OR_DEPARTMENT_OTHER): Payer: Self-pay

## 2024-05-11 ENCOUNTER — Other Ambulatory Visit (HOSPITAL_BASED_OUTPATIENT_CLINIC_OR_DEPARTMENT_OTHER): Payer: Self-pay

## 2024-05-14 ENCOUNTER — Other Ambulatory Visit (HOSPITAL_BASED_OUTPATIENT_CLINIC_OR_DEPARTMENT_OTHER): Payer: Self-pay

## 2024-05-17 NOTE — Telephone Encounter (Signed)
 Medical Buy and [employer] Stable - Prior Authorization NOT required for [employer]

## 2024-05-17 NOTE — Telephone Encounter (Addendum)
 [employer]  Patient is ready for scheduling on or after 05/29/24  Out-of-pocket cost due at time of visit: $0  Primary: [employer]  Medicare Prolia  co-insurance: 20% (approximately $352.56) Admin fee co-insurance: 20% (approximately $25)  Deductible: $257 of $257 met  Prior Auth: NOT required  Secondary: [place] [employer] [place] Prolia  co-insurance: Covers Medicare Part B co-insurance Admin fee co-insurance: Covers Medicare Part B co-insurance  Deductible:  Covered by secondary  Prior Auth: NOT required  PA# Valid:   ** This summary of benefits is an estimation of the patient's out-of-pocket cost. Exact cost may vary based on individual plan coverage.

## 2024-05-23 ENCOUNTER — Other Ambulatory Visit: Payer: Self-pay | Admitting: Family

## 2024-05-23 ENCOUNTER — Other Ambulatory Visit: Payer: Self-pay | Admitting: Family Medicine

## 2024-05-23 DIAGNOSIS — M545 Low back pain, unspecified: Secondary | ICD-10-CM

## 2024-05-23 DIAGNOSIS — M542 Cervicalgia: Secondary | ICD-10-CM

## 2024-05-23 DIAGNOSIS — M546 Pain in thoracic spine: Secondary | ICD-10-CM

## 2024-05-24 ENCOUNTER — Other Ambulatory Visit (HOSPITAL_BASED_OUTPATIENT_CLINIC_OR_DEPARTMENT_OTHER): Payer: Self-pay

## 2024-05-24 ENCOUNTER — Other Ambulatory Visit: Payer: Self-pay

## 2024-05-24 MED ORDER — LEVOCETIRIZINE DIHYDROCHLORIDE 5 MG PO TABS
5.0000 mg | ORAL_TABLET | Freq: Every evening | ORAL | 1 refills | Status: AC
  Filled 2024-05-24: qty 90, 90d supply, fill #0

## 2024-05-25 NOTE — Telephone Encounter (Signed)
 Patient does not want to schedule at this time.  Patient states that she will call back towards the end of the year to schedule.

## 2024-05-28 ENCOUNTER — Ambulatory Visit: Payer: Medicare Other | Admitting: Internal Medicine

## 2024-05-31 ENCOUNTER — Encounter: Payer: Self-pay | Admitting: Family Medicine

## 2024-05-31 ENCOUNTER — Encounter: Payer: Self-pay | Admitting: Internal Medicine

## 2024-05-31 ENCOUNTER — Ambulatory Visit: Payer: Medicare Other | Admitting: Internal Medicine

## 2024-05-31 ENCOUNTER — Other Ambulatory Visit (HOSPITAL_BASED_OUTPATIENT_CLINIC_OR_DEPARTMENT_OTHER): Payer: Self-pay

## 2024-05-31 ENCOUNTER — Other Ambulatory Visit

## 2024-05-31 VITALS — BP 136/80 | HR 88 | Resp 16 | Ht 63.0 in | Wt 138.4 lb

## 2024-05-31 DIAGNOSIS — E890 Postprocedural hypothyroidism: Secondary | ICD-10-CM

## 2024-05-31 DIAGNOSIS — E559 Vitamin D deficiency, unspecified: Secondary | ICD-10-CM

## 2024-05-31 DIAGNOSIS — M810 Age-related osteoporosis without current pathological fracture: Secondary | ICD-10-CM | POA: Diagnosis not present

## 2024-05-31 MED ORDER — TRAMADOL HCL 50 MG PO TABS
50.0000 mg | ORAL_TABLET | Freq: Four times a day (QID) | ORAL | 0 refills | Status: DC | PRN
  Filled 2024-05-31: qty 60, 8d supply, fill #0

## 2024-05-31 NOTE — Progress Notes (Signed)
 Patient ID: [name], female   DOB: [birth year], 83 y.o.   MRN: [id number]   HPI  [name] is a 83 y.o.-year-old female, referred by her PCP, Dr. Antonio, presenting for follow-up for osteoporosis.  Last visit 1.5 years ago.  Interim history: No falls or fractures since last visit. Also, no dizziness/vertigo. He continues to have back pain.  Previously in physical therapy.  Reviewed and addended history: Pt was dx with OP in in 1999-2000, in [place] .  Reviewed her DXA scan reports: Date L1-L4 T score FN T score 33% distal Radius Ultra distal radius  12/06/2021 ([facility]) N/a LFN: -3.0 RFN: -3.0 (+2.0%) -2.9 (+1.1% -4.5  12/02/2019 ([facility]) N/a LFN: -2.8 RFN: -2.7  (+9.4%*) -3.0 (+2.2%) -4.0  05/29/2017 ([facility]) N/a (multiple compression fractures) LFN: -3.1 (+0.5%) RFN: -2.9 -3.2 (-1.0%) -4.6  05/29/2015 ([facility]) L1, L3: -2.7 LFN: -3.1 -3.1 n/a  06/04/2012 L1-L4: -3.4 LFN: -2.2 RFN: -2.2 n/a n/a   Fractures: 7 vertebral fractures with 6 kyphoplasties.  Reviewed imaging studies: CT Lumbar spine report from 2016: Remote compression fractures with cement augmentation T11, T12, L2, L4 and L5. Tiny amount of cement seen adjacent to the right L4 and L5 nerve roots. Subacute superior endplate compression deformity L1 and L3.  CT lumbar spine (04/16/2023): 1. Old vertebral augmentations at T11, T12, L2, L4 and L5 as seen on the study of 2016. No new fracture or additional collapse. 2. Fusion or near fusion of the facet joints on the right from L2-3 through L4-5. 3. L4-5: Bulging of the disc. Mild facet and ligamentous hypertrophy. Mild stenosis of both lateral recesses could possibly be symptomatic. 4. L5-S1: Mild disc bulge. Bilateral facet osteoarthritis with 1-2 mm of anterolisthesis. No apparent compressive stenosis. 5. Sacroiliac osteoarthritis, left worse than right. No evidence of sacral insufficiency  fracture.  Osteoporosis treatments: - Fosamax   - 2 years >> ineffective - Forteo - 2 years  - 2003 - Prolia  - started 2012 (however, only 1 documented inj/year in 2012, 2014, 2015 and no doses documented in 2017):  10/09/2017 05/14/2018 11/19/2018 06/01/2019 12/15/2019 06/15/2020 12/20/2020 06/29/2021 01/09/2022 11/22/2022 05/27/2023 11/26/2023 Due 05/28/2024 - >300$  She has a history of vitamin D  insufficiency.  Reviewed available vit D levels: Lab Results  Component Value Date   VD25OH 78 05/27/2023   VD25OH 83.86 11/22/2021   VD25OH 69.4 12/20/2020   VD25OH 52.5 11/16/2019   VD25OH 37.32 01/18/2019   VD25OH 29.82 (L) 10/06/2017   VD25OH 32 07/04/2009   VD25OH 24 (L) 02/14/2009   On vitamin D  7000 units daily: Vit D3...............5,000 i.u. Multi w/D3.......1,000 i.u. . -She was having problems swallowing this due to the large tablet >> stopped  She was prev. doing weightbearing exercises: stretch bands; and also balance exercises, walking on treadmill. Now walking and using elastic bands.  She does not take high vitamin A doses.  No steroid injections.  She had early menopause, at 34 to 83 years old.  No HRT.   Pt does have a FH of osteoporosis: Mother- fx'ed ankle, foot.  No history of kidney stones, hyper or hypocalcemia or hyperparathyroidism: Lab Results  Component Value Date   PTH 25 10/06/2017   PTH Comment 10/06/2017   CALCIUM  9.7 01/09/2024   CALCIUM  10.6 (H) 12/10/2023   CALCIUM  9.9 12/08/2023   CALCIUM  10.1 01/06/2023   CALCIUM  9.8 08/27/2021   CALCIUM  9.1 06/06/2021   CALCIUM  9.4 10/16/2020  CALCIUM  9.1 09/29/2020   CALCIUM  9.6 09/21/2020   CALCIUM  8.6 (L) 09/17/2020   No history of thyrotoxicosis. She has a h/o thyroid  nodules >> now s/p total thyroidectomy, on LT4 100 mcg.  Latest TSH was normal: Lab Results  Component Value Date   TSH 4.19 01/06/2023   TSH 1.03 11/22/2021   TSH 1.33 12/20/2020   TSH 1.34 12/11/2020   TSH 3.04  07/12/2019   Pt is on levothyroxine  100 mcg daily, taken: - in am - fasting - at least 1h from b'fast - stopped calcium   - no iron - + multivitamins >> later in the day - + PPIs in am 30 min before b'fast >> stopped - not on Biotin, takes a liquid B complex w/o Biotin  No history of CKD. She had only 1 kidney. Last BUN/Cr: Lab Results  Component Value Date   BUN 20 01/09/2024   CREATININE 0.96 01/09/2024   She also has HTN.  ROS: + leg swelling (wears compression hoses), + see HPI  I reviewed pt's medications, allergies, PMH, social hx, family hx, and changes were documented in the history of present illness. Otherwise, unchanged from my initial visit note.  Past Medical History:  Diagnosis Date   Allergy    Atrioventricular block, complete (HCC)    a. 08/2011 Upgrade of PPM to MDT Adapta L Dual Chamber PPM ser # [id number].   Breast cancer (HCC)    Cardiac pacemaker in situ    Contact dermatitis and eczema    unspec cause   Dairy product intolerance    Depression    Diastolic dysfunction    GERD (gastroesophageal reflux disease)    Hiatal hernia    History of diverticulitis of colon    2003  perforated diverticulitis w/ surgical intervention   History of kidney stones    age 36   History of peripheral edema    lower extremities   History of thyroid  nodule    multinodular goiter  s/p  total thyroidectomy 2014   HTN (hypertension)    Hydronephrosis, left    Hypothyroidism    Mild sleep apnea    per study 04-14-2014   Nocturia    NSVT (nonsustained ventricular tachycardia) (HCC)    Osteoporosis    PAF (paroxysmal atrial fibrillation) (HCC)    PONV (postoperative nausea and vomiting)    Presence of permanent cardiac pacemaker    Pulmonary nodules    stable per last ct   Renal cell carcinoma of left kidney (HCC)    Superficial thrombophlebitis    Past Surgical History:  Procedure Laterality Date   BREAST BIOPSY Right 12/03/2023   MM RT BREAST BX W LOC DEV  1ST LESION IMAGE BX SPEC STEREO GUIDE 12/03/2023 [facility]   BREAST BIOPSY Right 12/03/2023   US  RT BREAST BX W LOC DEV EA ADD LESION IMG BX SPEC US  GUIDE 12/03/2023 [facility]   BREAST BIOPSY Right 12/03/2023   US  RT BREAST BX W LOC DEV 1ST LESION IMG BX SPEC US  GUIDE 12/03/2023 [facility]   BREAST BIOPSY  01/13/2024   MM RT RADIOACTIVE SEED LOC MAMMO GUIDE 01/13/2024 [facility]   BREAST BIOPSY  01/13/2024   MM RT RADIOACTIVE SEED EA ADD LESION LOC MAMMO GUIDE 01/13/2024 [facility]   BREAST BIOPSY  01/13/2024   MM RT RADIOACTIVE SEED EA ADD LESION LOC MAMMO GUIDE 01/13/2024 [facility]   BREAST LUMPECTOMY WITH RADIOACTIVE SEED LOCALIZATION Right 01/15/2024   Procedure: BREAST LUMPECTOMY WITH RADIOACTIVE SEED  LOCALIZATION;  Surgeon: Vanderbilt Ned, MD;  Location: [facility];  Service: General;  Laterality: Right;  RIGHT BREAST SEED LUMPECTOMY BRACKETED   CARDIOVASCULAR STRESS TEST  04-20-2015  dr fernande   normal nuclear study/  no ischemia/  normal LV function and wall motion, ef 74%   COLON SURGERY  2002-06-12   COLONOSCOPY  01/19/2013   COLOSTOMY TAKEDOWN  07/22/2002   CYSTOSCOPY WITH RETROGRADE PYELOGRAM, URETEROSCOPY AND STENT PLACEMENT Left 07/28/2015   Procedure: CYSTOSCOPY WITH LEFT RETROGRADE PYELOGRAM, POSSIBLE LEFT URETEROSCOPY AND STENT PLACEMENT;  Surgeon: Garnette Shack, MD;  [facility];  Service: Urology;  Laterality: Left;   EXPLORATORY LAPARTOMY SIGMOID COLECTOMY/ COLOSTOMY  04/28/2002   perforated diverticulitis   KYPHOPLASTY     LAPAROSCOPY VENTRAL HERNIA REPAIR/  EXTENSIVE LYSIS ADHESIONS  05/04/2004   PACEMAKER GENERATOR CHANGE Left 09/16/2011   MDT ADDRL1 pacemaker   PACEMAKER PLACEMENT  05/11/2003   medtronic   PPM GENERATOR CHANGEOUT N/A 12/09/2023   Procedure: PPM GENERATOR CHANGEOUT;  Surgeon: Waddell Danelle ORN, MD;  Location: [facility];  Service: Cardiovascular;  Laterality: N/A;   RE-EXCISION OF BREAST  LUMPECTOMY Right 02/05/2024   Procedure: RE- EXCISION, LESION, RIGHT BREAST;  Surgeon: Vanderbilt Ned, MD;  Location: [facility];  Service: General;  Laterality: Right;   ROBOT ASSISTED LAPAROSCOPIC NEPHRECTOMY Left 03/20/2016   Procedure: XI ROBOTIC ASSISTED LAPAROSCOPIC RETROPERITONEAL NEPHRECTOMY;  Surgeon: Ricardo Likens, MD;  Location: [facility];  Service: Urology;  Laterality: Left;   THYROIDECTOMY N/A 08/07/2012   Procedure: THYROIDECTOMY;  Surgeon: Krystal CHRISTELLA Spinner, MD;  Location: [facility];  Service: General;  Laterality: N/A;   TRANSTHORACIC ECHOCARDIOGRAM  10/12/2014   moderate focal basal LVH,  ef 55-60%,  grade 2 diastolic dysfunction, mild AV calcification without stenosis,  mild MR,  trivial PR   Social History   Socioeconomic History   Marital status: Single    Spouse name: Not on file   Number of children: 2   Years of education: 16   Highest education level: Not on file  Occupational History   Occupation: Retired nurse, adult for the [employer]    Employer: RETIRED  Tobacco Use   Smoking status: Former    Current packs/day: 0.00    Types: Cigarettes    Start date: 10/05/1964    Quit date: 10/06/1979    Years since quitting: 44.6   Smokeless tobacco: Never  Vaping Use   Vaping status: Never Used  Substance and Sexual Activity   Alcohol use: No   Drug use: No   Sexual activity: Not Currently    Birth control/protection: None  Other Topics Concern   Not on file  Social History Narrative   The patient is a buyer, retail of [employer].  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  She was married for 22 years, divorced, and has remained single. She has 1 grown son, 1 grown daughter.  She has 3 grandchildren, 2 of her children   live in [place].  She is retired.  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 She had a long term companion, 32 years, who passed away Jun 13, 2011.   Social Drivers of Health   Financial Resource Strain:  Patient Declined (02/13/2024)   Overall Financial Resource Strain (CARDIA)    Difficulty of Paying Living Expenses: Patient declined  Food Insecurity: Patient Declined (02/13/2024)   Hunger Vital Sign    Worried About Running Out  of Food in the Last Year: Patient declined    Ran Out of Food in the Last Year: Patient declined  Transportation Needs: Patient Declined (02/13/2024)   PRAPARE - Administrator, Civil Service (Medical): Patient declined    Lack of Transportation (Non-Medical): Patient declined  Physical Activity: Unknown (02/13/2024)   Exercise Vital Sign    Days of Exercise per Week: Patient declined    Minutes of Exercise per Session: Not on file  Stress: Patient Declined (02/13/2024)   [employer] of Occupational Health - Occupational Stress Questionnaire    Feeling of Stress: Patient declined  Social Connections: Unknown (02/13/2024)   Social Connection and Isolation Panel    Frequency of Communication with Friends and Family: Patient declined    Frequency of Social Gatherings with Friends and Family: Patient declined    Attends Religious Services: Patient declined    Database Administrator or Organizations: Patient declined    Attends Banker Meetings: Not on file    Marital Status: Patient declined  Intimate Partner Violence: Not At Risk (03/21/2021)   Humiliation, Afraid, Rape, and Kick questionnaire    Fear of Current or Ex-Partner: No    Emotionally Abused: No    Physically Abused: No    Sexually Abused: No   Current Outpatient Medications on File Prior to Visit  Medication Sig Dispense Refill   lisinopril  (ZESTRIL ) 40 MG tablet Take 1 tablet (40 mg total) by mouth daily. 90 tablet 0   acetaminophen  (TYLENOL ) 500 MG tablet Take 1,000 mg by mouth every 8 (eight) hours as needed for moderate pain (pain score 4-6).     amLODipine  (NORVASC ) 10 MG tablet Take 1 tablet (10 mg total) by mouth daily. 90 tablet 3   apixaban  (ELIQUIS ) 5 MG TABS  tablet Take 1 tablet (5 mg total) by mouth 2 (two) times daily. 180 tablet 1   azelastine  (ASTELIN ) 0.1 % nasal spray Place 1 spray into both nostrils 2 (two) times daily. Use in each nostril as directed 30 mL 12   Bacillus Coagulans-Inulin (PROBIOTIC-PREBIOTIC PO) Take 1 capsule by mouth daily.     cephALEXin  (KEFLEX ) 500 MG capsule Take 1 capsule (500 mg total) by mouth 2 (two) times daily. 20 capsule 0   Chlorpheniramine-Acetaminophen  (CORICIDIN HBP COLD/FLU PO) Take 2 tablets by mouth daily as needed (cold symptoms).     Cholecalciferol (VITAMIN D3 MAXIMUM STRENGTH) 125 MCG (5000 UT) capsule Take 5,000 Units by mouth daily.     denosumab  (PROLIA ) 60 MG/ML SOSY injection Inject 60 mg into the skin every 6 (six) months.     diclofenac  Sodium (VOLTAREN  ARTHRITIS PAIN) 1 % GEL Apply 4 g topically 4 (four) times daily. 150 g 3   fluticasone  (FLONASE ) 50 MCG/ACT nasal spray Place 2 sprays into both nostrils daily as needed for allergies or rhinitis. 16 g 5   hydrochlorothiazide  (MICROZIDE ) 12.5 MG capsule Take 1 capsule by mouth once daily 90 capsule 2   levocetirizine (XYZAL ) 5 MG tablet Take 1 tablet (5 mg total) by mouth every evening. 90 tablet 1   levothyroxine  (SYNTHROID ) 100 MCG tablet TAKE 1 TABLET BY MOUTH ONCE DAILY . APPOINTMENT REQUIRED FOR FUTURE REFILLS 90 tablet 3   lidocaine  4 % Place 2 patches onto the skin daily as needed (pain).     loratadine  (ALLERGY RELIEF, LORATADINE ,) 10 MG tablet Take 1 tablet (10 mg total) by mouth daily as needed for allergies or rhinitis. 100 tablet 1   Menthol, Topical  Analgesic, (STOPAIN ROLL-ON EX) Apply 1 Application topically daily as needed (pain).     Multiple Vitamin (MULTIVITAMIN WITH MINERALS) TABS tablet Take 1 tablet by mouth daily.     Multiple Vitamins-Minerals (AIRBORNE PO) Take 1 tablet by mouth daily as needed (immune support).     Multiple Vitamins-Minerals (PRESERVISION AREDS 2) CAPS Take 2 capsules by mouth daily.     OVER THE COUNTER  MEDICATION Apply 1 Application topically daily as needed (neuropathy in feet). raitera neuropathy relief cream     Polyethyl Glycol-Propyl Glycol (LUBRICATING EYE DROPS OP) Place 1 drop into both eyes daily as needed (dry eyes). ROHTO eye drops     traMADol  (ULTRAM ) 50 MG tablet Take 1-2 tablets (50-100 mg total) by mouth every 6 (six) hours as needed for pain. 60 tablet 0   Zinc Oxide-Vitamin C (ZINC PLUS VITAMIN C PO) Take 1 tablet by mouth every other day.     Current Facility-Administered Medications on File Prior to Visit  Medication Dose Route Frequency Provider Last Rate Last Admin   aminophylline  injection 150 mg  150 mg Intravenous BID PRN Nelson, Katarina H, MD   150 mg at 04/20/15 1200   denosumab  (PROLIA ) injection 60 mg  60 mg Subcutaneous Once Belvie Iribe, MD       Allergies  Allergen Reactions   Tape Rash    Paper Tape Only    Codeine Nausea And Vomiting   Latex Itching and Rash   Family History  Problem Relation Age of Onset   Heart disease Mother        Stent in her 66s   Stroke Mother    Hypertension Mother    Varicose Veins Mother    Heart failure Father    Diverticulosis Sister    Hypertension Brother    Heart disease Brother    Hypertension Brother    Breast cancer Maternal Aunt 70 - 79   Heart attack Neg Hx    PE: BP 136/80   Pulse 88   Resp 16   Ht 5' 3 (1.6 m)   Wt 138 lb 6.4 oz (62.8 kg)   SpO2 96%   BMI 24.52 kg/m  Wt Readings from Last 3 Encounters:  05/31/24 138 lb 6.4 oz (62.8 kg)  03/10/24 135 lb 9.6 oz (61.5 kg)  03/03/24 137 lb 4 oz (62.3 kg)   Constitutional: normal weight, in NAD Eyes:  EOMI, no exophthalmos ENT: no neck masses, no cervical lymphadenopathy Cardiovascular: RRR, No MRG Respiratory: CTA B Musculoskeletal: no deformities Skin:no rashes Neurological: no tremor with outstretched hands  Assessment: 1. Osteoporosis  2.  Vitamin D  insufficiency  3.  Postsurgical hypothyroidism  Plan: 1. Osteoporosis -  Likely postmenopausal (early menopause, no HRT), age-related, + family history of osteoporosis - Bone density report from 11/2019 showed a significant decrease in BMD at the femoral necks. T score was low in the ultra distal and 33% distal radius, but improved.  She had another bone density scan 11/2021 which showed a proximal stability at the femoral neck and 33% distal radius but decrease in BMD at the level of her ultra distal radius, which has a very low T-score, of -4.5.  A lumbar CT from 04/2023 showed no new fractures.  This was checked due to increased back pain.  She remains at high risk for fractures.  Will check another bone density scan now. -In the past investigation was negative for excessive steroid production, hyperparathyroidism, severe vitamin D  deficiency, RA, Cushing syndrome, systemic mastocytosis.  She did not have a history of cancer to suggest metastasis to the bone, no renal failure to indicate multiple myeloma, no history of low alkaline phosphatase, no previous fractures before her vertebral fx or blue sclerae to indicate osteogenesis imperfecta.  - She did have several vertebral fractures, possibly as a consequence of early menopause and also missed Prolia  injections.  We did discuss about not missing the injections and not delaying by more than maximum 7 months due to the increased risk for fracture and loss of BMD. She did have a larger gap between injections from 01/17/2022 to 11/22/2022.  Upon questioning, she mentions that she was not called to schedule this.  I advised her to put it on the calendar for the right month and then to call us  if she is not called about the injections by the middle of the month. - She was on Prolia  for more than 10 years, but she did miss doses in the past, as mentioned above.  For now, especially since the bone density continues to improve or stay stable, we will continue it.  We discussed that we could continue by more than 10 years if needed.  She  tolerates it well without jaw/hip/thigh pain. - She was supposed to have another Prolia  injection now, however, she has a large co-pay, more than $300.  She may need to wait until the first of the year, but we discussed about the possibility of giving her a biosimilar, Jubbonti.  Will check on her insurance coverage for this so hopefully we can give her the injection this month. - We will give her 1-2 doses of Reclast when preparing to start Prolia  - She does have a history of using Forteo for 2 years.  We could use romosozumab afterwards if absolutely needed. - Latest calcium  and vitamin D  levels were normal Lab Results  Component Value Date   CALCIUM  9.7 01/09/2024  - Again discussed about weightbearing and balance exercises. She is using bands and walk, but we discussed also about using hand weights while walking and dumbbells. - I will see her back in a year  2.  Vitamin D  insufficiency - Latest vitamin D  level was normal: Lab Results  Component Value Date   VD25OH 78 05/27/2023  - We decreased the dose from 7000 to 6000 units vitamin D  daily - she was previously complaining that her vitamin D /calcium  tablet was too large, so I advised her to stop it.  She was getting calcium  from the multivitamin and I advised her to getting the rest from the diet (a total of 1200 mg daily) - We will recheck her vitamin D  level today  3.  Postsurgical hypothyroidism - latest thyroid  labs reviewed with pt. >> normal: Lab Results  Component Value Date   TSH 4.19 01/06/2023  - she continues on LT4 100 mcg daily - pt feels good on this dose. - we discussed about taking the thyroid  hormone every day, with water, >30 minutes before breakfast, separated by >4 hours from acid reflux medications, calcium , iron, multivitamins. Pt. is taking it correctly. - will check a TSH today - If labs are abnormal, she will need to return for repeat TFTs in 1.5 months  Needs refills LT4.  Orders Placed This Encounter   Procedures   DG Bone Density   TSH   VITAMIN D  25 Hydroxy (Vit-D Deficiency, Fractures)   Lela Fendt, MD PhD [facility] Endocrinology

## 2024-05-31 NOTE — Patient Instructions (Signed)
 Please stop at the lab.  Continue Levothyroxine  100 mcg daily.  Take the thyroid  hormone every day, with water, at least 30 minutes before breakfast, separated by at least 4 hours from: - acid reflux medications - calcium  - iron - multivitamins  Please schedule a new bone density scan.  Please come back for a follow-up appointment in 1 year.

## 2024-05-31 NOTE — Telephone Encounter (Signed)
 Requesting: tramadol  50mg   Contract: None UDS: None Last Visit: 02/16/24 Next Visit: None Last Refill: 04/16/24 #60 and 0RF   Please Advise

## 2024-06-01 ENCOUNTER — Ambulatory Visit: Payer: Self-pay | Admitting: Internal Medicine

## 2024-06-01 LAB — VITAMIN D 25 HYDROXY (VIT D DEFICIENCY, FRACTURES): Vit D, 25-Hydroxy: 89 ng/mL (ref 30–100)

## 2024-06-01 LAB — TSH: TSH: 3.62 m[IU]/L (ref 0.40–4.50)

## 2024-06-04 ENCOUNTER — Ambulatory Visit

## 2024-06-10 ENCOUNTER — Ambulatory Visit

## 2024-06-10 DIAGNOSIS — I480 Paroxysmal atrial fibrillation: Secondary | ICD-10-CM

## 2024-06-10 LAB — CUP PACEART REMOTE DEVICE CHECK
Battery Remaining Longevity: 120 mo
Battery Voltage: 3.16 V
Brady Statistic AP VP Percent: 10.83 %
Brady Statistic AP VS Percent: 0 %
Brady Statistic AS VP Percent: 89.03 %
Brady Statistic AS VS Percent: 0.14 %
Brady Statistic RA Percent Paced: 10.78 %
Brady Statistic RV Percent Paced: 99.86 %
Date Time Interrogation Session: 20251210192818
Implantable Lead Connection Status: 753985
Implantable Lead Connection Status: 753985
Implantable Lead Implant Date: 20041110
Implantable Lead Implant Date: 20041110
Implantable Lead Location: 753859
Implantable Lead Location: 753860
Implantable Pulse Generator Implant Date: 20250610
Lead Channel Impedance Value: 304 Ohm
Lead Channel Impedance Value: 342 Ohm
Lead Channel Impedance Value: 399 Ohm
Lead Channel Impedance Value: 456 Ohm
Lead Channel Pacing Threshold Amplitude: 0.625 V
Lead Channel Pacing Threshold Amplitude: 1.25 V
Lead Channel Pacing Threshold Pulse Width: 0.4 ms
Lead Channel Pacing Threshold Pulse Width: 0.4 ms
Lead Channel Sensing Intrinsic Amplitude: 13.875 mV
Lead Channel Sensing Intrinsic Amplitude: 13.875 mV
Lead Channel Sensing Intrinsic Amplitude: 2.375 mV
Lead Channel Sensing Intrinsic Amplitude: 2.375 mV
Lead Channel Setting Pacing Amplitude: 2.5 V
Lead Channel Setting Pacing Amplitude: 2.5 V
Lead Channel Setting Pacing Pulse Width: 0.4 ms
Lead Channel Setting Sensing Sensitivity: 2 mV
Zone Setting Status: 755011

## 2024-06-11 ENCOUNTER — Ambulatory Visit

## 2024-06-11 VITALS — BP 136/88 | HR 74 | Resp 18

## 2024-06-11 DIAGNOSIS — M810 Age-related osteoporosis without current pathological fracture: Secondary | ICD-10-CM | POA: Diagnosis not present

## 2024-06-11 MED ORDER — DENOSUMAB 60 MG/ML ~~LOC~~ SOSY: 60.0000 mg | PREFILLED_SYRINGE | SUBCUTANEOUS | Status: AC

## 2024-06-11 NOTE — Progress Notes (Signed)
 After obtaining consent, and per orders of Dr. Trixie, injection of Prolia  given by Anton CHRISTELLA Bound. Patient instructed to remain in clinic for 20 minutes afterwards, and to report any adverse reaction to me immediately. Patient did mentioned she had a Lipoma in the left arm but is doing well no concerns.

## 2024-06-14 ENCOUNTER — Inpatient Hospital Stay: Admitting: Adult Health

## 2024-06-17 NOTE — Progress Notes (Signed)
 Remote PPM Transmission

## 2024-06-25 NOTE — Telephone Encounter (Signed)
 Last Prolia  inj 06/11/25 Next Prolia  inj due 12/11/24

## 2024-06-30 ENCOUNTER — Other Ambulatory Visit: Payer: Self-pay | Admitting: Family Medicine

## 2024-06-30 DIAGNOSIS — M546 Pain in thoracic spine: Secondary | ICD-10-CM

## 2024-06-30 DIAGNOSIS — M545 Low back pain, unspecified: Secondary | ICD-10-CM

## 2024-06-30 NOTE — Telephone Encounter (Signed)
 Requesting: Tramadol  Contract: n/a UDS: n/a Last OV: 02/16/2024 Next OV: n/a Last Refill: 05/31/2024, #60--0 RF Database:   Please advise

## 2024-07-01 MED ORDER — TRAMADOL HCL 50 MG PO TABS
50.0000 mg | ORAL_TABLET | Freq: Four times a day (QID) | ORAL | 0 refills | Status: DC | PRN
  Filled 2024-07-01: qty 60, 8d supply, fill #0

## 2024-07-02 ENCOUNTER — Other Ambulatory Visit (HOSPITAL_BASED_OUTPATIENT_CLINIC_OR_DEPARTMENT_OTHER): Payer: Self-pay

## 2024-07-03 ENCOUNTER — Ambulatory Visit: Payer: Self-pay | Admitting: Cardiology

## 2024-07-05 DIAGNOSIS — E039 Hypothyroidism, unspecified: Secondary | ICD-10-CM

## 2024-07-08 ENCOUNTER — Inpatient Hospital Stay: Admitting: Adult Health

## 2024-07-08 ENCOUNTER — Other Ambulatory Visit: Payer: Self-pay | Admitting: Family Medicine

## 2024-07-08 ENCOUNTER — Other Ambulatory Visit (HOSPITAL_BASED_OUTPATIENT_CLINIC_OR_DEPARTMENT_OTHER): Payer: Self-pay

## 2024-07-08 DIAGNOSIS — I10 Essential (primary) hypertension: Secondary | ICD-10-CM

## 2024-07-08 MED ORDER — LISINOPRIL 40 MG PO TABS
40.0000 mg | ORAL_TABLET | Freq: Every day | ORAL | 0 refills | Status: AC
  Filled 2024-07-08: qty 90, 90d supply, fill #0

## 2024-07-12 ENCOUNTER — Encounter: Payer: Self-pay | Admitting: *Deleted

## 2024-07-13 ENCOUNTER — Encounter: Payer: Self-pay | Admitting: Family Medicine

## 2024-07-13 ENCOUNTER — Ambulatory Visit (INDEPENDENT_AMBULATORY_CARE_PROVIDER_SITE_OTHER): Admitting: Family Medicine

## 2024-07-13 VITALS — BP 114/70 | HR 80 | Temp 98.1°F | Resp 16 | Ht 63.0 in | Wt 140.0 lb

## 2024-07-13 DIAGNOSIS — H6993 Unspecified Eustachian tube disorder, bilateral: Secondary | ICD-10-CM

## 2024-07-13 DIAGNOSIS — E039 Hypothyroidism, unspecified: Secondary | ICD-10-CM | POA: Diagnosis not present

## 2024-07-13 DIAGNOSIS — E785 Hyperlipidemia, unspecified: Secondary | ICD-10-CM

## 2024-07-13 DIAGNOSIS — I10 Essential (primary) hypertension: Secondary | ICD-10-CM | POA: Diagnosis not present

## 2024-07-13 DIAGNOSIS — E890 Postprocedural hypothyroidism: Secondary | ICD-10-CM

## 2024-07-13 NOTE — Assessment & Plan Note (Signed)
 Well controlled, no changes to meds. Encouraged heart healthy diet such as the DASH diet and exercise as tolerated.

## 2024-07-13 NOTE — Assessment & Plan Note (Signed)
 Encourage heart healthy diet such as MIND or DASH diet, increase exercise, avoid trans fats, simple carbohydrates and processed foods, consider a krill or fish or flaxseed oil cap daily.

## 2024-07-13 NOTE — Progress Notes (Signed)
 "  Subjective:    Patient ID: [name], female    DOB: 1940/11/03, 84 y.o.   MRN: [id number]  Chief Complaint  Patient presents with   Hypertension   Follow-up    HPI Patient is in today for f/u.  Discussed the use of AI scribe software for clinical note transcription with the patient, who gave verbal consent to proceed.  History of Present Illness [name] is an 84 year old female who presents with ear symptoms and hand pain.  She experiences a recurrence of ear symptoms, described as a 'whooshing' or 'swishing' sound, similar to wind. These symptoms had temporarily subsided during the warmer months but have returned, though not as severely as before. She attempted to see an ENT specialist but faced multiple appointment cancellations and has not pursued further consultation. She is currently using Flonase  and azelastine  nasal sprays, as well as Xyzal , which she believes may be helping with drainage.  She reports persistent pain in her hand, for which she has been using diclofenac  gel, Tylenol , and tramadol . She is concerned about using diclofenac  due to her blood thinner medication. She has also tried a roll-on analgesic called 'Stop Pain' and lidocaine  patches for temporary relief. She has not tried using ice or a splint yet.  She mentions a missed follow-up appointment for post-cancer care, which she plans to reschedule with the help of her granddaughter.    Past Medical History:  Diagnosis Date   Allergy    Atrioventricular block, complete (HCC)    a. 08/2011 Upgrade of PPM to MDT Adapta L Dual Chamber PPM ser # [id number].   Breast cancer (HCC)    Cardiac pacemaker in situ    Contact dermatitis and eczema    unspec cause   Dairy product intolerance    Depression    Diastolic dysfunction    GERD (gastroesophageal reflux disease)    Hiatal hernia    History of diverticulitis of colon    2003  perforated diverticulitis w/ surgical intervention   History of  kidney stones    age 63   History of peripheral edema    lower extremities   History of thyroid  nodule    multinodular goiter  s/p  total thyroidectomy 2014   HTN (hypertension)    Hydronephrosis, left    Hypothyroidism    Mild sleep apnea    per study 04-14-2014   Nocturia    NSVT (nonsustained ventricular tachycardia) (HCC)    Osteoporosis    PAF (paroxysmal atrial fibrillation) (HCC)    PONV (postoperative nausea and vomiting)    Presence of permanent cardiac pacemaker    Pulmonary nodules    stable per last ct   Renal cell carcinoma of left kidney (HCC)    Superficial thrombophlebitis     Past Surgical History:  Procedure Laterality Date   BREAST BIOPSY Right 12/03/2023   MM RT BREAST BX W LOC DEV 1ST LESION IMAGE BX SPEC STEREO GUIDE 12/03/2023 [facility]   BREAST BIOPSY Right 12/03/2023   US  RT BREAST BX W LOC DEV EA ADD LESION IMG BX SPEC US  GUIDE 12/03/2023 [facility]   BREAST BIOPSY Right 12/03/2023   US  RT BREAST BX W LOC DEV 1ST LESION IMG BX SPEC US  GUIDE 12/03/2023 [facility]   BREAST BIOPSY  01/13/2024   MM RT RADIOACTIVE SEED LOC MAMMO GUIDE 01/13/2024 [facility]   BREAST BIOPSY  01/13/2024   MM RT RADIOACTIVE SEED EA ADD LESION LOC MAMMO  GUIDE 01/13/2024 [facility]   BREAST BIOPSY  01/13/2024   MM RT RADIOACTIVE SEED EA ADD LESION LOC MAMMO GUIDE 01/13/2024 [facility]   BREAST LUMPECTOMY WITH RADIOACTIVE SEED LOCALIZATION Right 01/15/2024   Procedure: BREAST LUMPECTOMY WITH RADIOACTIVE SEED LOCALIZATION;  Surgeon: Vanderbilt Ned, MD;  Location: [facility];  Service: General;  Laterality: Right;  RIGHT BREAST SEED LUMPECTOMY BRACKETED   CARDIOVASCULAR STRESS TEST  04-20-2015  dr fernande   normal nuclear study/  no ischemia/  normal LV function and wall motion, ef 74%   COLON SURGERY  2003   COLONOSCOPY  01/19/2013   COLOSTOMY TAKEDOWN  07/22/2002   CYSTOSCOPY WITH RETROGRADE PYELOGRAM, URETEROSCOPY AND STENT PLACEMENT Left 07/28/2015    Procedure: CYSTOSCOPY WITH LEFT RETROGRADE PYELOGRAM, POSSIBLE LEFT URETEROSCOPY AND STENT PLACEMENT;  Surgeon: Garnette Shack, MD;  Location: [facility];  Service: Urology;  Laterality: Left;   EXPLORATORY LAPARTOMY SIGMOID COLECTOMY/ COLOSTOMY  04/28/2002   perforated diverticulitis   KYPHOPLASTY     LAPAROSCOPY VENTRAL HERNIA REPAIR/  EXTENSIVE LYSIS ADHESIONS  05/04/2004   PACEMAKER GENERATOR CHANGE Left 09/16/2011   MDT ADDRL1 pacemaker   PACEMAKER PLACEMENT  05/11/2003   medtronic   PPM GENERATOR CHANGEOUT N/A 12/09/2023   Procedure: PPM GENERATOR CHANGEOUT;  Surgeon: Waddell Danelle ORN, MD;  Location: [facility];  Service: Cardiovascular;  Laterality: N/A;   RE-EXCISION OF BREAST LUMPECTOMY Right 02/05/2024   Procedure: RE- EXCISION, LESION, RIGHT BREAST;  Surgeon: Vanderbilt Ned, MD;  Location: [facility];  Service: General;  Laterality: Right;   ROBOT ASSISTED LAPAROSCOPIC NEPHRECTOMY Left 03/20/2016   Procedure: XI ROBOTIC ASSISTED LAPAROSCOPIC RETROPERITONEAL NEPHRECTOMY;  Surgeon: Ricardo Likens, MD;  Location: [facility];  Service: Urology;  Laterality: Left;   THYROIDECTOMY N/A 08/07/2012   Procedure: THYROIDECTOMY;  Surgeon: Krystal CHRISTELLA Spinner, MD;  Location: [facility];  Service: General;  Laterality: N/A;   TRANSTHORACIC ECHOCARDIOGRAM  10/12/2014   moderate focal basal LVH,  ef 55-60%,  grade 2 diastolic dysfunction, mild AV calcification without stenosis,  mild MR,  trivial PR    Family History  Problem Relation Age of Onset   Heart disease Mother        Stent in her 76s   Stroke Mother    Hypertension Mother    Varicose Veins Mother    Heart failure Father    Diverticulosis Sister    Hypertension Brother    Heart disease Brother    Hypertension Brother    Breast cancer Maternal Aunt 23 - 79   Heart attack Neg Hx     Social History   Socioeconomic History   Marital status: Single    Spouse name: Not on file   Number of children: 2   Years of  education: 16   Highest education level: Associate degree: academic program  Occupational History   Occupation: Retired nurse, adult for the [employer]    Employer: RETIRED  Tobacco Use   Smoking status: Former    Current packs/day: 0.00    Types: Cigarettes    Start date: 10/05/1964    Quit date: 10/06/1979    Years since quitting: 44.8   Smokeless tobacco: Never  Vaping Use   Vaping status: Never Used  Substance and Sexual Activity   Alcohol use: No   Drug use: No   Sexual activity: Not Currently    Birth control/protection: None  Other Topics Concern   Not on file  Social History Narrative   The patient is  a buyer, retail of [employer].  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  She was married for 22 years, divorced, and has remained single. She has 1 grown son, 1 grown daughter.  She has 3 grandchildren, 2 of her children   live in [place].  She is retired.  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 She had a long term companion, 32 years, who passed away 07-30-2010.   Social Drivers of Health   Tobacco Use: Medium Risk (07/13/2024)   Patient History    Smoking Tobacco Use: Former    Smokeless Tobacco Use: Never    Passive Exposure: Not on file  Financial Resource Strain: Medium Risk (07/11/2024)   Overall Financial Resource Strain (CARDIA)    Difficulty of Paying Living Expenses: Somewhat hard  Food Insecurity: Food Insecurity Present (07/11/2024)   Epic    Worried About Programme Researcher, Broadcasting/film/video in the Last Year: Often true    Ran Out of Food in the Last Year: Sometimes true  Transportation Needs: No Transportation Needs (07/11/2024)   Epic    Lack of Transportation (Medical): No    Lack of Transportation (Non-Medical): No  Physical Activity: Insufficiently Active (07/11/2024)   Exercise Vital Sign    Days of Exercise per Week: 2 days    Minutes of Exercise per Session: 10 min  Stress: No Stress Concern Present (07/11/2024)    [employer] of Occupational Health - Occupational Stress Questionnaire    Feeling of Stress: Not at all  Social Connections: Moderately Isolated (07/11/2024)   Social Connection and Isolation Panel    Frequency of Communication with Friends and Family: More than three times a week    Frequency of Social Gatherings with Friends and Family: Twice a week    Attends Religious Services: More than 4 times per year    Active Member of [employer] or Organizations: No    Attends Engineer, Structural: Not on file    Marital Status: Divorced  Intimate Partner Violence: Not on file  Depression (PHQ2-9): Low Risk (07/29/2023)   Depression (PHQ2-9)    PHQ-2 Score: 0  Alcohol Screen: Low Risk (07/28/2023)   Alcohol Screen    Last Alcohol Screening Score (AUDIT): 0  Housing: Unknown (07/11/2024)   Epic    Unable to Pay for Housing in the Last Year: No    Number of Times Moved in the Last Year: Not on file    Homeless in the Last Year: No  Utilities: Not At Risk (07/29/2023)   AHC Utilities    Threatened with loss of utilities: No  Health Literacy: Adequate Health Literacy (07/29/2023)   B1300 Health Literacy    Frequency of need for help with medical instructions: Never    Outpatient Medications Prior to Visit  Medication Sig Dispense Refill   acetaminophen  (TYLENOL ) 500 MG tablet Take 1,000 mg by mouth every 8 (eight) hours as needed for moderate pain (pain score 4-6).     amLODipine  (NORVASC ) 10 MG tablet Take 1 tablet (10 mg total) by mouth daily. 90 tablet 3   apixaban  (ELIQUIS ) 5 MG TABS tablet Take 1 tablet (5 mg total) by mouth 2 (two) times daily. 180 tablet 1   azelastine  (ASTELIN ) 0.1 % nasal spray Place 1 spray into both nostrils 2 (two) times daily. Use in each nostril as directed 30 mL 12   Bacillus Coagulans-Inulin (PROBIOTIC-PREBIOTIC PO) Take 1 capsule by mouth daily.  Chlorpheniramine-Acetaminophen  (CORICIDIN HBP COLD/FLU PO) Take 2 tablets by mouth daily as needed  (cold symptoms).     Cholecalciferol (VITAMIN D3 MAXIMUM STRENGTH) 125 MCG (5000 UT) capsule Take 5,000 Units by mouth daily.     denosumab  (PROLIA ) 60 MG/ML SOSY injection Inject 60 mg into the skin every 6 (six) months.     diclofenac  Sodium (VOLTAREN  ARTHRITIS PAIN) 1 % GEL Apply 4 g topically 4 (four) times daily. 150 g 3   fluticasone  (FLONASE ) 50 MCG/ACT nasal spray Place 2 sprays into both nostrils daily as needed for allergies or rhinitis. 16 g 5   hydrochlorothiazide  (MICROZIDE ) 12.5 MG capsule Take 1 capsule by mouth once daily 90 capsule 2   levocetirizine (XYZAL ) 5 MG tablet Take 1 tablet (5 mg total) by mouth every evening. 90 tablet 1   levothyroxine  (SYNTHROID ) 100 MCG tablet Take 1 tablet (100 mcg total) by mouth daily before breakfast. 90 tablet 1   lidocaine  4 % Place 2 patches onto the skin daily as needed (pain).     lisinopril  (ZESTRIL ) 40 MG tablet Take 1 tablet (40 mg total) by mouth daily. 90 tablet 0   loratadine  (ALLERGY RELIEF, LORATADINE ,) 10 MG tablet Take 1 tablet (10 mg total) by mouth daily as needed for allergies or rhinitis. 100 tablet 1   Menthol, Topical Analgesic, (STOPAIN ROLL-ON EX) Apply 1 Application topically daily as needed (pain).     Multiple Vitamin (MULTIVITAMIN WITH MINERALS) TABS tablet Take 1 tablet by mouth daily.     Multiple Vitamins-Minerals (AIRBORNE PO) Take 1 tablet by mouth daily as needed (immune support).     Multiple Vitamins-Minerals (PRESERVISION AREDS 2) CAPS Take 2 capsules by mouth daily.     OVER THE COUNTER MEDICATION Apply 1 Application topically daily as needed (neuropathy in feet). raitera neuropathy relief cream     Polyethyl Glycol-Propyl Glycol (LUBRICATING EYE DROPS OP) Place 1 drop into both eyes daily as needed (dry eyes). ROHTO eye drops     traMADol  (ULTRAM ) 50 MG tablet Take 1-2 tablets (50-100 mg total) by mouth every 6 (six) hours as needed for pain. 60 tablet 0   Zinc Oxide-Vitamin C (ZINC PLUS VITAMIN C PO) Take 1  tablet by mouth every other day.     cephALEXin  (KEFLEX ) 500 MG capsule Take 1 capsule (500 mg total) by mouth 2 (two) times daily. 20 capsule 0   Facility-Administered Medications Prior to Visit  Medication Dose Route Frequency Provider Last Rate Last Admin   aminophylline  injection 150 mg  150 mg Intravenous BID PRN Nelson, Katarina H, MD   150 mg at 04/20/15 1200   denosumab  (PROLIA ) injection 60 mg  60 mg Subcutaneous Q6 months Trixie File, MD        Allergies[1]  Review of Systems  Constitutional:  Negative for chills, fever and malaise/fatigue.  HENT:  Negative for congestion and hearing loss.   Eyes:  Negative for discharge.  Respiratory:  Negative for cough, sputum production and shortness of breath.   Cardiovascular:  Negative for chest pain, palpitations and leg swelling.  Gastrointestinal:  Negative for abdominal pain, blood in stool, constipation, diarrhea, heartburn, nausea and vomiting.  Genitourinary:  Negative for dysuria, frequency, hematuria and urgency.  Musculoskeletal:  Negative for back pain, falls and myalgias.  Skin:  Negative for rash.  Neurological:  Negative for dizziness, sensory change, loss of consciousness, weakness and headaches.  Endo/Heme/Allergies:  Negative for environmental allergies. Does not bruise/bleed easily.  Psychiatric/Behavioral:  Negative for depression and  suicidal ideas. The patient is not nervous/anxious and does not have insomnia.        Objective:    Physical Exam Vitals and nursing note reviewed.  Constitutional:      General: She is not in acute distress.    Appearance: Normal appearance. She is well-developed.  HENT:     Head: Normocephalic and atraumatic.     Right Ear: Tympanic membrane, ear canal and external ear normal. There is no impacted cerumen.     Left Ear: Tympanic membrane, ear canal and external ear normal. There is no impacted cerumen.     Nose: Nose normal.     Mouth/Throat:     Mouth: Mucous membranes  are moist.     Pharynx: Oropharynx is clear. No oropharyngeal exudate or posterior oropharyngeal erythema.  Eyes:     General: No scleral icterus.       Right eye: No discharge.        Left eye: No discharge.     Conjunctiva/sclera: Conjunctivae normal.     Pupils: Pupils are equal, round, and reactive to light.  Neck:     Thyroid : No thyromegaly or thyroid  tenderness.     Vascular: No JVD.  Cardiovascular:     Rate and Rhythm: Normal rate and regular rhythm.     Heart sounds: Normal heart sounds. No murmur heard. Pulmonary:     Effort: Pulmonary effort is normal. No respiratory distress.     Breath sounds: Normal breath sounds.  Abdominal:     General: Bowel sounds are normal. There is no distension.     Palpations: Abdomen is soft. There is no mass.     Tenderness: There is no abdominal tenderness. There is no guarding or rebound.  Genitourinary:    Vagina: Normal.  Musculoskeletal:        General: Normal range of motion.     Cervical back: Normal range of motion and neck supple.     Right lower leg: No edema.     Left lower leg: No edema.  Lymphadenopathy:     Cervical: No cervical adenopathy.  Skin:    General: Skin is warm and dry.     Findings: No erythema or rash.  Neurological:     Mental Status: She is alert and oriented to person, place, and time.     Cranial Nerves: No cranial nerve deficit.     Deep Tendon Reflexes: Reflexes are normal and symmetric.  Psychiatric:        Mood and Affect: Mood normal.        Behavior: Behavior normal.        Thought Content: Thought content normal.        Judgment: Judgment normal.     BP 114/70 (BP Location: Right Arm, Patient Position: Sitting, Cuff Size: Normal)   Pulse 80   Temp 98.1 F (36.7 C) (Oral)   Resp 16   Ht 5' 3 (1.6 m)   Wt 140 lb (63.5 kg)   SpO2 96%   BMI 24.80 kg/m  Wt Readings from Last 3 Encounters:  07/13/24 140 lb (63.5 kg)  05/31/24 138 lb 6.4 oz (62.8 kg)  03/10/24 135 lb 9.6 oz (61.5 kg)     Diabetic Foot Exam - Simple   No data filed    Lab Results  Component Value Date   WBC 5.9 01/09/2024   HGB 12.6 01/09/2024   HCT 38.5 01/09/2024   PLT 160 01/09/2024   GLUCOSE 124 (H) 01/09/2024  CHOL 172 01/06/2023   TRIG 89.0 01/06/2023   HDL 47.20 01/06/2023   LDLCALC 107 (H) 01/06/2023   ALT 15 12/10/2023   AST 28 12/10/2023   NA 137 01/09/2024   K 4.1 01/09/2024   CL 102 01/09/2024   CREATININE 0.96 01/09/2024   BUN 20 01/09/2024   CO2 23 01/09/2024   TSH 3.62 05/31/2024   INR 1.01 11/04/2013   HGBA1C 5.5 06/07/2016    Lab Results  Component Value Date   TSH 3.62 05/31/2024   Lab Results  Component Value Date   WBC 5.9 01/09/2024   HGB 12.6 01/09/2024   HCT 38.5 01/09/2024   MCV 86.7 01/09/2024   PLT 160 01/09/2024   Lab Results  Component Value Date   NA 137 01/09/2024   K 4.1 01/09/2024   CO2 23 01/09/2024   GLUCOSE 124 (H) 01/09/2024   BUN 20 01/09/2024   CREATININE 0.96 01/09/2024   BILITOT 0.9 12/10/2023   ALKPHOS 57 12/10/2023   AST 28 12/10/2023   ALT 15 12/10/2023   PROT 8.4 (H) 12/10/2023   ALBUMIN 5.4 (H) 12/10/2023   CALCIUM  9.7 01/09/2024   ANIONGAP 12 01/09/2024   EGFR 51 (L) 12/08/2023   GFR 50.55 (L) 01/06/2023   Lab Results  Component Value Date   CHOL 172 01/06/2023   Lab Results  Component Value Date   HDL 47.20 01/06/2023   Lab Results  Component Value Date   LDLCALC 107 (H) 01/06/2023   Lab Results  Component Value Date   TRIG 89.0 01/06/2023   Lab Results  Component Value Date   CHOLHDL 4 01/06/2023   Lab Results  Component Value Date   HGBA1C 5.5 06/07/2016       Assessment & Plan:  Essential hypertension Assessment & Plan: Well controlled, no changes to meds. Encouraged heart healthy diet such as the DASH diet and exercise as tolerated.    Orders: -     Comprehensive metabolic panel with GFR -     CBC with Differential/Platelet -     Lipid panel  Hypothyroidism, unspecified  type  Disorder of both eustachian tubes  Hyperlipidemia, unspecified hyperlipidemia type Assessment & Plan: Encourage heart healthy diet such as MIND or DASH diet, increase exercise, avoid trans fats, simple carbohydrates and processed foods, consider a krill or fish or flaxseed oil cap daily.     Hypothyroidism, postsurgical Assessment & Plan: Check labs     Assessment and Plan Assessment & Plan Bilateral eustachian tube disorder   Intermittent swishing sounds in her ears have returned, though less severe than before. These symptoms previously resolved during warmer months. Multiple ENT appointments were canceled. She is currently using Flonase  and azelastine  nasal sprays, which may aid drainage. Continue these sprays. Consider ENT referral if symptoms worsen.  Hand tendinitis   She experiences chronic pain in her right hand, likely due to tendinitis. A previous x-ray showed no abnormalities. Pain is temporarily relieved by diclofenac , but its use is advised against due to blood thinner use. She currently uses Tylenol , tramadol , and topical analgesics. Phone use may exacerbate the pain. Use a splint to immobilize the thumb and wrist, apply ice, and consider lidocaine  patches for pain relief. Avoid diclofenac  and other NSAIDs. Consider orthopedic referral if pain persists.  Hypothyroidism   Thyroid  function tests were last conducted in December and were normal.  General Health Maintenance   Cholesterol levels have not been checked in over a year. A cholesterol blood test has been ordered.  Avan Gullett R Lowne Chase, DO     [1]  Allergies Allergen Reactions   Tape Rash    Paper Tape Only    Codeine Nausea And Vomiting   Latex Itching and Rash   "

## 2024-07-13 NOTE — Assessment & Plan Note (Signed)
 Check labs

## 2024-07-14 LAB — CBC WITH DIFFERENTIAL/PLATELET
Basophils Absolute: 0 K/uL (ref 0.0–0.1)
Basophils Relative: 1 % (ref 0.0–3.0)
Eosinophils Absolute: 0.1 K/uL (ref 0.0–0.7)
Eosinophils Relative: 2.1 % (ref 0.0–5.0)
HCT: 36.7 % (ref 36.0–46.0)
Hemoglobin: 12.5 g/dL (ref 12.0–15.0)
Lymphocytes Relative: 16.4 % (ref 12.0–46.0)
Lymphs Abs: 0.8 K/uL (ref 0.7–4.0)
MCHC: 34 g/dL (ref 30.0–36.0)
MCV: 83.4 fl (ref 78.0–100.0)
Monocytes Absolute: 0.6 K/uL (ref 0.1–1.0)
Monocytes Relative: 12.5 % — ABNORMAL HIGH (ref 3.0–12.0)
Neutro Abs: 3.4 K/uL (ref 1.4–7.7)
Neutrophils Relative %: 68 % (ref 43.0–77.0)
Platelets: 146 K/uL — ABNORMAL LOW (ref 150.0–400.0)
RBC: 4.4 Mil/uL (ref 3.87–5.11)
RDW: 13.5 % (ref 11.5–15.5)
WBC: 5 K/uL (ref 4.0–10.5)

## 2024-07-14 LAB — LIPID PANEL
Cholesterol: 177 mg/dL (ref 28–200)
HDL: 47.5 mg/dL
LDL Cholesterol: 106 mg/dL — ABNORMAL HIGH (ref 10–99)
NonHDL: 129.19
Total CHOL/HDL Ratio: 4
Triglycerides: 118 mg/dL (ref 10.0–149.0)
VLDL: 23.6 mg/dL (ref 0.0–40.0)

## 2024-07-14 LAB — COMPREHENSIVE METABOLIC PANEL WITH GFR
ALT: 12 U/L (ref 3–35)
AST: 22 U/L (ref 5–37)
Albumin: 4.6 g/dL (ref 3.5–5.2)
Alkaline Phosphatase: 53 U/L (ref 39–117)
BUN: 24 mg/dL — ABNORMAL HIGH (ref 6–23)
CO2: 30 meq/L (ref 19–32)
Calcium: 9.7 mg/dL (ref 8.4–10.5)
Chloride: 99 meq/L (ref 96–112)
Creatinine, Ser: 0.97 mg/dL (ref 0.40–1.20)
GFR: 53.12 mL/min — ABNORMAL LOW
Glucose, Bld: 90 mg/dL (ref 70–99)
Potassium: 4.8 meq/L (ref 3.5–5.1)
Sodium: 135 meq/L (ref 135–145)
Total Bilirubin: 0.6 mg/dL (ref 0.2–1.2)
Total Protein: 7 g/dL (ref 6.0–8.3)

## 2024-07-15 ENCOUNTER — Inpatient Hospital Stay: Attending: Adult Health | Admitting: Adult Health

## 2024-07-15 ENCOUNTER — Encounter: Payer: Self-pay | Admitting: Adult Health

## 2024-07-15 ENCOUNTER — Encounter: Payer: Self-pay | Admitting: Family Medicine

## 2024-07-15 DIAGNOSIS — C50211 Malignant neoplasm of upper-inner quadrant of right female breast: Secondary | ICD-10-CM | POA: Diagnosis not present

## 2024-07-15 DIAGNOSIS — Z170 Estrogen receptor positive status [ER+]: Secondary | ICD-10-CM

## 2024-07-15 DIAGNOSIS — C50811 Malignant neoplasm of overlapping sites of right female breast: Secondary | ICD-10-CM

## 2024-07-16 ENCOUNTER — Other Ambulatory Visit: Payer: Self-pay

## 2024-07-17 ENCOUNTER — Encounter: Payer: Self-pay | Admitting: Family Medicine

## 2024-07-18 ENCOUNTER — Ambulatory Visit: Payer: Self-pay | Admitting: Family Medicine

## 2024-07-18 NOTE — Progress Notes (Signed)
 [facility] Cancer Follow up:    Daisy Cyndee Jamee JONELLE, [name] [address] [place] [address]   DIAGNOSIS:  Cancer Staging  Malignant neoplasm of overlapping sites of right breast in female, estrogen receptor positive (HCC) Staging form: Breast, AJCC 8th Edition - Clinical: Stage IA (cT1c, cN0, cM0, G2, ER+, PR+, HER2-) - Signed by Odean Potts, MD on 12/10/2023 Stage prefix: Initial diagnosis Histologic grading system: 3 grade system    SUMMARY OF ONCOLOGIC HISTORY: Oncology History  Malignant neoplasm of overlapping sites of right breast in female, estrogen receptor positive (HCC)  12/03/2023 Initial Diagnosis   Screening mammogram detected right breast masses and calcifications, 2 masses at 2 o'clock position 1.5 cm and 1.7 cm: Biopsy grade 2 IDC ER 2%, PR 95%, Ki-67 5%, HER2 1+ negative; calcifications 0.8 cm biopsy: ADH in intraductal papilloma   12/10/2023 Cancer Staging   Staging form: Breast, AJCC 8th Edition - Clinical: Stage IA (cT1c, cN0, cM0, G2, ER+, PR+, HER2-) - Signed by Odean Potts, MD on 12/10/2023 Stage prefix: Initial diagnosis Histologic grading system: 3 grade system   01/15/2024 Surgery   RIGHT BREAST LUMPECTOMY: grade 2 IDC, 1.4 cm, intermediate grade DCIS; ER+/PR+/HER2-; no lymph nodes submitted;    02/05/2024 Surgery   Re-excision to clear margins   03/15/2024 - 04/05/2024 Radiation Therapy   Plan Name: Breast_R Site: Breast, Right Technique: 3D Mode: Photon Dose Per Fraction: 2.67 Gy Prescribed Dose (Delivered / Prescribed): 40.05 Gy / 40.05 Gy Prescribed Fxs (Delivered / Prescribed): 15 / 15     INTERVAL HISTORY:  Discussed the use of AI scribe software for clinical note transcription with the patient, who gave verbal consent to proceed.  History of Present Illness [name] is an 84 year old female with stage IA ER/PR-positive right breast cancer, status post lumpectomy, re-excision, and radiation  therapy, who presents for post-treatment oncology surveillance and management of chronic post-surgical breast pain.  She completed lumpectomy and re-excision for stage IA right breast cancer followed by adjuvant radiation therapy without lymph node dissection. Bone density testing is scheduled for later this month.  She notes new aching at the right breast surgical site that can be severe and is distinct from her chronic back pain from vertebral compression fractures. The aching is intermittent, sometimes with brief twinges that she associates with weather changes. She denies seroma, lymphedema, or unexpected radiation or surgical complications.  She follows with her primary care provider and is up to date with other cancer screenings, though she recently missed a colonoscopy appointment. She is interested in circulating tumor DNA testing (Guardant Reveal) and plans to review written information before deciding on participation.     Patient Active Problem List   Diagnosis Date Noted   Malignant neoplasm of upper-inner quadrant of right female breast (HCC) 03/03/2024   Malignant neoplasm of overlapping sites of right breast in female, estrogen receptor positive (HCC) 12/08/2023   Acute non-recurrent pansinusitis 07/16/2023   Nocturia 04/16/2023   Feeling of incomplete bladder emptying 04/16/2023   Pelvic pain 04/16/2023   Radiculopathy, lumbar region 04/01/2023   Frequency of urination 03/11/2023   Need for influenza vaccination 03/11/2023   Degenerative disc disease, lumbar 01/06/2023   Neck pain 11/08/2022   Degenerative disc disease, cervical 11/08/2022   Acute bilateral low back pain with bilateral sciatica 04/25/2022   History of vertebral compression fracture 04/25/2022   Non-recurrent acute serous otitis media of right ear 09/02/2021   Chronic diastolic heart  failure (HCC) 07/04/2021   Aortic ectasia, unspecified site 07/04/2021   SOB (shortness of breath) 06/15/2021   Pulmonary  nodule 06/15/2021   Hypokalemia 10/02/2020   Neuropathy 10/02/2020   Elevated serum creatinine 10/02/2020   AKI (acute kidney injury) 09/15/2020   Left-sided chest pain    Congestion of nasal sinus    Substernal chest pain 09/13/2020   Spider veins 04/05/2020   Varicose veins of bilateral lower extremities with other complications 04/05/2020   H/O seasonal allergies 04/05/2020   Numbness and tingling of both feet 12/23/2019   Paroxysmal atrial fibrillation (HCC) 07/22/2019   V-tach (HCC) 07/22/2019   Lipoma of left upper extremity 03/11/2019   Hordeolum externum of left upper eyelid 02/15/2019   Hyponatremia 02/15/2019   Hyperlipidemia 06/03/2017   Renal mass 03/20/2016   Fecal impaction (HCC) 10/16/2013   Vertebral compression fracture (HCC) 10/16/2013   Constipation 10/15/2013   Abdominal pain 10/15/2013   PUD (peptic ulcer disease) 10/15/2013   Low back pain with radiation 10/15/2013   Hypothyroidism, 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  MEDICAL HISTORY: Past Medical History:  Diagnosis Date   Allergy    Atrioventricular block, complete (HCC)    a. 08/2011 Upgrade of PPM to MDT Adapta L Dual Chamber PPM ser # [id number].   Breast cancer (HCC)    Cardiac pacemaker in situ    Contact dermatitis and eczema    unspec cause   Dairy product intolerance    Depression    Diastolic dysfunction    GERD  (gastroesophageal reflux disease)    Hiatal hernia    History of diverticulitis of colon    2003  perforated diverticulitis w/ surgical intervention   History of kidney stones    age 15   History of peripheral edema    lower extremities   History of thyroid  nodule    multinodular goiter  s/p  total thyroidectomy 2014   HTN (hypertension)    Hydronephrosis, left    Hypothyroidism    Mild sleep apnea    per study 04-14-2014   Nocturia    NSVT (nonsustained ventricular tachycardia) (HCC)    Osteoporosis    PAF (paroxysmal atrial fibrillation) (HCC)    PONV (postoperative nausea and vomiting)    Presence of permanent cardiac pacemaker    Pulmonary nodules    stable per last ct   Renal cell carcinoma of left kidney (HCC)    Superficial thrombophlebitis     SURGICAL HISTORY: Past Surgical History:  Procedure Laterality Date   BREAST BIOPSY Right 12/03/2023   MM RT BREAST BX W LOC DEV 1ST LESION IMAGE BX SPEC STEREO GUIDE 12/03/2023 [facility]   BREAST BIOPSY Right 12/03/2023   US  RT BREAST BX W LOC DEV EA ADD LESION IMG BX SPEC US  GUIDE 12/03/2023 [facility]   BREAST BIOPSY Right 12/03/2023   US  RT BREAST BX W LOC DEV 1ST LESION IMG BX SPEC US  GUIDE 12/03/2023 [facility]  BREAST BIOPSY  01/13/2024   MM RT RADIOACTIVE SEED LOC MAMMO GUIDE 01/13/2024 [facility]   BREAST BIOPSY  01/13/2024   MM RT RADIOACTIVE SEED EA ADD LESION LOC MAMMO GUIDE 01/13/2024 [facility]   BREAST BIOPSY  01/13/2024   MM RT RADIOACTIVE SEED EA ADD LESION LOC MAMMO GUIDE 01/13/2024 [facility]   BREAST LUMPECTOMY WITH RADIOACTIVE SEED LOCALIZATION Right 01/15/2024   Procedure: BREAST LUMPECTOMY WITH RADIOACTIVE SEED LOCALIZATION;  Surgeon: Vanderbilt Ned, MD;  Location: [facility];  Service: General;  Laterality: Right;  RIGHT BREAST SEED LUMPECTOMY BRACKETED   CARDIOVASCULAR STRESS TEST  04-20-2015  dr fernande   normal nuclear study/  no ischemia/  normal LV function and wall  motion, ef 74%   COLON SURGERY  2003   COLONOSCOPY  01/19/2013   COLOSTOMY TAKEDOWN  07/22/2002   CYSTOSCOPY WITH RETROGRADE PYELOGRAM, URETEROSCOPY AND STENT PLACEMENT Left 07/28/2015   Procedure: CYSTOSCOPY WITH LEFT RETROGRADE PYELOGRAM, POSSIBLE LEFT URETEROSCOPY AND STENT PLACEMENT;  Surgeon: Garnette Shack, MD;  Location: [facility];  Service: Urology;  Laterality: Left;   EXPLORATORY LAPARTOMY SIGMOID COLECTOMY/ COLOSTOMY  04/28/2002   perforated diverticulitis   KYPHOPLASTY     LAPAROSCOPY VENTRAL HERNIA REPAIR/  EXTENSIVE LYSIS ADHESIONS  05/04/2004   PACEMAKER GENERATOR CHANGE Left 09/16/2011   MDT ADDRL1 pacemaker   PACEMAKER PLACEMENT  05/11/2003   medtronic   PPM GENERATOR CHANGEOUT N/A 12/09/2023   Procedure: PPM GENERATOR CHANGEOUT;  Surgeon: Waddell Danelle ORN, MD;  Location: [facility];  Service: Cardiovascular;  Laterality: N/A;   RE-EXCISION OF BREAST LUMPECTOMY Right 02/05/2024   Procedure: RE- EXCISION, LESION, RIGHT BREAST;  Surgeon: Vanderbilt Ned, MD;  Location: [facility];  Service: General;  Laterality: Right;   ROBOT ASSISTED LAPAROSCOPIC NEPHRECTOMY Left 03/20/2016   Procedure: XI ROBOTIC ASSISTED LAPAROSCOPIC RETROPERITONEAL NEPHRECTOMY;  Surgeon: Ricardo Likens, MD;  Location: [facility];  Service: Urology;  Laterality: Left;   THYROIDECTOMY N/A 08/07/2012   Procedure: THYROIDECTOMY;  Surgeon: Krystal CHRISTELLA Spinner, MD;  Location: [facility];  Service: General;  Laterality: N/A;   TRANSTHORACIC ECHOCARDIOGRAM  10/12/2014   moderate focal basal LVH,  ef 55-60%,  grade 2 diastolic dysfunction, mild AV calcification without stenosis,  mild MR,  trivial PR    SOCIAL HISTORY: Social History   Socioeconomic History   Marital status: Single    Spouse name: Not on file   Number of children: 2   Years of education: 16   Highest education level: Associate degree: academic program  Occupational History   Occupation: Retired nurse, adult for the [employer]    Employer: RETIRED  Tobacco Use   Smoking status: Former    Current packs/day: 0.00    Types: Cigarettes    Start date: 10/05/1964    Quit date: 10/06/1979    Years since quitting: 44.8   Smokeless tobacco: Never  Vaping Use   Vaping status: Never Used  Substance and Sexual Activity   Alcohol use: No   Drug use: No   Sexual activity: Not Currently    Birth control/protection: None  Other Topics Concern   Not on file  Social History Narrative   The patient is a buyer, retail of [employer].  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  She was married for 22 years, divorced, and has remained single. She has 1 grown son, 1 grown daughter.  She has 3 grandchildren, 2 of her children   live in  [place].  She is retired.  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 She had a long term companion, 32 years, who passed away 2010/08/21.   Social Drivers of Health   Tobacco Use: Medium Risk (07/15/2024)   Patient History    Smoking Tobacco Use: Former    Smokeless Tobacco Use: Never    Passive Exposure: Not on file  Financial Resource Strain: Medium Risk (07/11/2024)   Overall Financial Resource Strain (CARDIA)    Difficulty of Paying Living Expenses: Somewhat hard  Food Insecurity: Food Insecurity Present (07/11/2024)   Epic    Worried About Programme Researcher, Broadcasting/film/video in the Last Year: Often true    Ran Out of Food in the Last Year: Sometimes true  Transportation Needs: No Transportation Needs (07/11/2024)   Epic    Lack of Transportation (Medical): No    Lack of Transportation (Non-Medical): No  Physical Activity: Insufficiently Active (07/11/2024)   Exercise Vital Sign    Days of Exercise per Week: 2 days    Minutes of Exercise per Session: 10 min  Stress: No Stress Concern Present (07/11/2024)   [employer] of Occupational Health - Occupational Stress Questionnaire    Feeling of Stress: Not at all  Social Connections: Moderately Isolated  (07/11/2024)   Social Connection and Isolation Panel    Frequency of Communication with Friends and Family: More than three times a week    Frequency of Social Gatherings with Friends and Family: Twice a week    Attends Religious Services: More than 4 times per year    Active Member of [employer] or Organizations: No    Attends Engineer, Structural: Not on file    Marital Status: Divorced  Intimate Partner Violence: Not on file  Depression (PHQ2-9): Low Risk (07/15/2024)   Depression (PHQ2-9)    PHQ-2 Score: 0  Alcohol Screen: Low Risk (07/28/2023)   Alcohol Screen    Last Alcohol Screening Score (AUDIT): 0  Housing: Unknown (07/11/2024)   Epic    Unable to Pay for Housing in the Last Year: No    Number of Times Moved in the Last Year: Not on file    Homeless in the Last Year: No  Utilities: Not At Risk (07/29/2023)   AHC Utilities    Threatened with loss of utilities: No  Health Literacy: Adequate Health Literacy (07/29/2023)   B1300 Health Literacy    Frequency of need for help with medical instructions: Never    FAMILY HISTORY: Family History  Problem Relation Age of Onset   Heart disease Mother        Stent in her 20s   Stroke Mother    Hypertension Mother    Varicose Veins Mother    Heart failure Father    Diverticulosis Sister    Hypertension Brother    Heart disease Brother    Hypertension Brother    Breast cancer Maternal Aunt 36 08/21/1977   Heart attack Neg Hx     Review of Systems  Constitutional:  Negative for appetite change, chills, fatigue, fever and unexpected weight change.  HENT:   Negative for hearing loss, lump/mass and trouble swallowing.   Eyes:  Negative for eye problems and icterus.  Respiratory:  Negative for chest tightness, cough and shortness of breath.   Cardiovascular:  Negative for chest pain, leg swelling and palpitations.  Gastrointestinal:  Negative for abdominal distention, abdominal pain, constipation, diarrhea, nausea and vomiting.   Endocrine: Negative for  hot flashes.  Genitourinary:  Negative for difficulty urinating.   Musculoskeletal:  Negative for arthralgias.  Skin:  Negative for itching and rash.  Neurological:  Negative for dizziness, extremity weakness, headaches and numbness.  Hematological:  Negative for adenopathy. Does not bruise/bleed easily.  Psychiatric/Behavioral:  Negative for depression. The patient is not nervous/anxious.       PHYSICAL EXAMINATION    There were no vitals filed for this visit.  Physical Exam Constitutional:      General: She is not in acute distress.    Appearance: Normal appearance. She is not toxic-appearing.  HENT:     Head: Normocephalic and atraumatic.     Mouth/Throat:     Mouth: Mucous membranes are moist.     Pharynx: Oropharynx is clear. No oropharyngeal exudate or posterior oropharyngeal erythema.  Eyes:     General: No scleral icterus. Cardiovascular:     Rate and Rhythm: Normal rate and regular rhythm.     Pulses: Normal pulses.     Heart sounds: Normal heart sounds.  Pulmonary:     Effort: Pulmonary effort is normal.     Breath sounds: Normal breath sounds.  Chest:     Comments: Right breast s/p lumpectomy and radiation, no sign of local recurrence; left breast benign Abdominal:     General: Abdomen is flat. Bowel sounds are normal. There is no distension.     Palpations: Abdomen is soft.     Tenderness: There is no abdominal tenderness.  Musculoskeletal:        General: No swelling.     Cervical back: Neck supple.  Lymphadenopathy:     Cervical: No cervical adenopathy.     Upper Body:     Right upper body: No supraclavicular or axillary adenopathy.     Left upper body: No supraclavicular or axillary adenopathy.  Skin:    General: Skin is warm and dry.     Findings: No rash.  Neurological:     General: No focal deficit present.     Mental Status: She is alert.  Psychiatric:        Mood and Affect: Mood normal.        Behavior: Behavior  normal.     LABORATORY DATA:  CBC    Component Value Date/Time   WBC 5.0 07/13/2024 1511   RBC 4.40 07/13/2024 1511   HGB 12.5 07/13/2024 1511   HGB 15.0 12/10/2023 1128   HGB 14.2 12/08/2023 1149   HCT 36.7 07/13/2024 1511   HCT 43.6 12/08/2023 1149   PLT 146.0 (L) 07/13/2024 1511   PLT 188 12/10/2023 1128   PLT 163 12/08/2023 1149   MCV 83.4 07/13/2024 1511   MCV 88 12/08/2023 1149   MCH 28.4 01/09/2024 1200   MCHC 34.0 07/13/2024 1511   RDW 13.5 07/13/2024 1511   RDW 13.2 12/08/2023 1149   LYMPHSABS 0.8 07/13/2024 1511   MONOABS 0.6 07/13/2024 1511   EOSABS 0.1 07/13/2024 1511   BASOSABS 0.0 07/13/2024 1511    CMP     Component Value Date/Time   NA 135 07/13/2024 1511   NA 139 12/08/2023 1149   K 4.8 07/13/2024 1511   CL 99 07/13/2024 1511   CO2 30 07/13/2024 1511   GLUCOSE 90 07/13/2024 1511   BUN 24 (H) 07/13/2024 1511   BUN 17 12/08/2023 1149   CREATININE 0.97 07/13/2024 1511   CREATININE 0.94 12/10/2023 1128   CREATININE 1.08 (H) 01/18/2019 1107   CALCIUM  9.7 07/13/2024 1511  PROT 7.0 07/13/2024 1511   ALBUMIN 4.6 07/13/2024 1511   AST 22 07/13/2024 1511   AST 28 12/10/2023 1128   ALT 12 07/13/2024 1511   ALT 15 12/10/2023 1128   ALKPHOS 53 07/13/2024 1511   BILITOT 0.6 07/13/2024 1511   BILITOT 0.9 12/10/2023 1128   GFRNONAA 58 (L) 01/09/2024 1200   GFRNONAA 59 (L) 12/10/2023 1128   GFRNONAA 48 (L) 01/18/2019 1107   GFRAA 56 (L) 01/18/2019 1107     ASSESSMENT and THERAPY PLAN:     Assessment and Plan Assessment & Plan Stage IA estrogen and progesterone receptor positive invasive carcinoma of the right breast, post-surgery and radiation Under surveillance post-lumpectomy, re-excision, and radiation. No recurrence, lymphedema, or seroma. Late radiation effects present. - Ordered annual mammogram, first two as 3D diagnostic at preferred facility. - Scheduled oncology follow-up every six months, alternating with surgery clinics. - Provided  information on Guardant Reveal circulating tumor DNA testing for review. - Recommended continued annual follow-up with primary care provider. - Provided guidance on healthy diet, exercise, and support services. - Advised monitoring for skin changes and dermatology referral if needed.  Chronic post-surgical breast pain Mild intermittent breast aching consistent with chronic post-surgical and post-radiation changes, no acute complications or recurrence. - Provided reassurance on benign nature and expected gradual improvement. - Advised monitoring for new or worsening symptoms.      All questions were answered. The patient knows to call the clinic with any problems, questions or concerns. We can certainly see the patient much sooner if necessary.  Total encounter time:40 minutes*in face-to-face visit time, chart review, lab review, care coordination, order entry, and documentation of the encounter time.    Morna Kendall, NP 07/18/24 11:14 PM Medical Oncology and Hematology [facility] [address] [place], [place] [address] Tel. [phone]    Fax. [phone]  *Total Encounter Time as defined by the Centers for Medicare and Medicaid Services includes, in addition to the face-to-face time of a patient visit (documented in the note above) non-face-to-face time: obtaining and reviewing outside history, ordering and reviewing medications, tests or procedures, care coordination (communications with other health care professionals or caregivers) and documentation in the medical record.

## 2024-07-27 ENCOUNTER — Other Ambulatory Visit (HOSPITAL_BASED_OUTPATIENT_CLINIC_OR_DEPARTMENT_OTHER)

## 2024-07-30 ENCOUNTER — Ambulatory Visit

## 2024-07-30 VITALS — BP 121/66 | Ht 63.0 in | Wt 137.0 lb

## 2024-07-30 DIAGNOSIS — Z0000 Encounter for general adult medical examination without abnormal findings: Secondary | ICD-10-CM | POA: Diagnosis not present

## 2024-07-30 NOTE — Patient Instructions (Signed)
 Ms. Shadrick,  Thank you for taking the time for your Medicare Wellness Visit. I appreciate your continued commitment to your health goals. Please review the care plan we discussed, and feel free to reach out if I can assist you further.  Please note that Annual Wellness Visits do not include a physical exam. Some assessments may be limited, especially if the visit was conducted virtually. If needed, we may recommend an in-person follow-up with your provider.  Ongoing Care Seeing your primary care provider every 3 to 6 months helps us  monitor your health and provide consistent, personalized care.   Referrals If a referral was made during today's visit and you haven't received any updates within two weeks, please contact the referred provider directly to check on the status.  Recommended Screenings:  Health Maintenance  Topic Date Due   DTaP/Tdap/Td vaccine (1 - Tdap) Never done   Zoster (Shingles) Vaccine (1 of 2) Never done   COVID-19 Vaccine (4 - 2025-26 season) 03/01/2024   Medicare Annual Wellness Visit  07/28/2024   Breast Cancer Screening  11/24/2024   Pneumococcal Vaccine for age over 1  Completed   Flu Shot  Completed   Osteoporosis screening with Bone Density Scan  Completed   Meningitis B Vaccine  Aged Out       07/29/2024    4:26 PM  Advanced Directives  Does Patient Have a Medical Advance Directive? No  Would patient like information on creating a medical advance directive? Yes (MAU/Ambulatory/Procedural Areas - Information given)    Vision: Annual vision screenings are recommended for early detection of glaucoma, cataracts, and diabetic retinopathy. These exams can also reveal signs of chronic conditions such as diabetes and high blood pressure.  Dental: Annual dental screenings help detect early signs of oral cancer, gum disease, and other conditions linked to overall health, including heart disease and diabetes.  Please see the attached documents for additional  preventive care recommendations.

## 2024-08-03 ENCOUNTER — Other Ambulatory Visit: Payer: Self-pay | Admitting: Family

## 2024-08-03 ENCOUNTER — Other Ambulatory Visit: Payer: Self-pay

## 2024-08-03 ENCOUNTER — Other Ambulatory Visit (HOSPITAL_BASED_OUTPATIENT_CLINIC_OR_DEPARTMENT_OTHER): Payer: Self-pay

## 2024-08-03 DIAGNOSIS — M546 Pain in thoracic spine: Secondary | ICD-10-CM

## 2024-08-03 DIAGNOSIS — M545 Low back pain, unspecified: Secondary | ICD-10-CM

## 2024-08-03 MED ORDER — TRAMADOL HCL 50 MG PO TABS
50.0000 mg | ORAL_TABLET | Freq: Four times a day (QID) | ORAL | 0 refills | Status: AC | PRN
  Filled 2024-08-03: qty 60, 8d supply, fill #0

## 2024-08-03 NOTE — Telephone Encounter (Signed)
 Requesting: tramadol  50mg   Contract: None UDS: None Last Visit: 07/13/24 Next Visit: None Last Refill: 07/01/24 #60 and 0RF   Please Advise

## 2024-08-18 ENCOUNTER — Other Ambulatory Visit (HOSPITAL_BASED_OUTPATIENT_CLINIC_OR_DEPARTMENT_OTHER)

## 2024-09-09 ENCOUNTER — Encounter

## 2024-11-11 ENCOUNTER — Encounter

## 2024-12-09 ENCOUNTER — Encounter

## 2025-02-10 ENCOUNTER — Inpatient Hospital Stay: Admitting: Hematology and Oncology

## 2025-03-10 ENCOUNTER — Encounter

## 2025-06-02 ENCOUNTER — Ambulatory Visit: Admitting: Internal Medicine

## 2025-06-09 ENCOUNTER — Encounter

## 2025-09-08 ENCOUNTER — Encounter

## 2025-12-08 ENCOUNTER — Encounter
